# Patient Record
Sex: Male | Born: 1965
Health system: Southern US, Community
[De-identification: ages and names within clinical notes are randomized; demographics above are authoritative.]

## PROBLEM LIST (undated history)

## (undated) DIAGNOSIS — C3490 Malignant neoplasm of unspecified part of unspecified bronchus or lung: Secondary | ICD-10-CM

## (undated) DIAGNOSIS — C801 Malignant (primary) neoplasm, unspecified: Secondary | ICD-10-CM

## (undated) DIAGNOSIS — I739 Peripheral vascular disease, unspecified: Secondary | ICD-10-CM

## (undated) DIAGNOSIS — K921 Melena: Secondary | ICD-10-CM

## (undated) DIAGNOSIS — J449 Chronic obstructive pulmonary disease, unspecified: Secondary | ICD-10-CM

## (undated) DIAGNOSIS — J4489 Other specified chronic obstructive pulmonary disease: Secondary | ICD-10-CM

## (undated) DIAGNOSIS — F319 Bipolar disorder, unspecified: Secondary | ICD-10-CM

## (undated) DIAGNOSIS — L309 Dermatitis, unspecified: Secondary | ICD-10-CM

## (undated) DIAGNOSIS — K219 Gastro-esophageal reflux disease without esophagitis: Secondary | ICD-10-CM

## (undated) DIAGNOSIS — F419 Anxiety disorder, unspecified: Secondary | ICD-10-CM

## (undated) HISTORY — PX: TONSILLECTOMY: SUR1361

## (undated) HISTORY — DX: Malignant neoplasm of unspecified part of unspecified bronchus or lung: C34.90

## (undated) HISTORY — DX: Gastro-esophageal reflux disease without esophagitis: K21.9

## (undated) HISTORY — DX: Melena: K92.1

## (undated) HISTORY — DX: Dermatitis, unspecified: L30.9

## (undated) HISTORY — DX: Chronic obstructive pulmonary disease, unspecified: J44.9

## (undated) HISTORY — PX: APPENDECTOMY: SHX54

## (undated) HISTORY — DX: Malignant (primary) neoplasm, unspecified: C80.1

## (undated) HISTORY — PX: INGUINAL HERNIA REPAIR: SUR1180

## (undated) HISTORY — DX: Other specified chronic obstructive pulmonary disease: J44.89

---

## 2007-01-31 ENCOUNTER — Emergency Department (HOSPITAL_COMMUNITY): Admission: EM | Admit: 2007-01-31 | Discharge: 2007-01-31 | Payer: Self-pay | Admitting: Emergency Medicine

## 2008-07-16 ENCOUNTER — Ambulatory Visit (HOSPITAL_COMMUNITY): Admission: RE | Admit: 2008-07-16 | Discharge: 2008-07-16 | Payer: Self-pay | Admitting: Specialist
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
## 2008-11-14 ENCOUNTER — Encounter: Payer: Self-pay | Admitting: Family Medicine

## 2008-11-20 ENCOUNTER — Encounter: Payer: Self-pay | Admitting: Family Medicine

## 2010-03-24 ENCOUNTER — Ambulatory Visit: Payer: Self-pay | Admitting: Family Medicine

## 2010-03-24 DIAGNOSIS — M545 Low back pain: Secondary | ICD-10-CM

## 2010-03-24 DIAGNOSIS — IMO0002 Reserved for concepts with insufficient information to code with codable children: Secondary | ICD-10-CM | POA: Insufficient documentation

## 2010-04-13 ENCOUNTER — Encounter: Payer: Self-pay | Admitting: Family Medicine

## 2010-06-09 NOTE — Letter (Signed)
Summary: [facility]   Imported By: Marily Memos 03/24/2010 15:08:49  _____________________________________________________________________  External Attachment:    Type:   Image     Comment:   External Document

## 2010-06-09 NOTE — Consult Note (Signed)
Summary: [facility]  [facility]   Imported By: Marily Memos 03/26/2010 08:54:06  _____________________________________________________________________  External Attachment:    Type:   Image     Comment:   External Document

## 2010-06-09 NOTE — Letter (Signed)
Summary: [facility]   Imported By: Marily Memos 04/13/2010 15:18:38  _____________________________________________________________________  External Attachment:    Type:   Image     Comment:   External Document
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
## 2010-06-30 ENCOUNTER — Ambulatory Visit (INDEPENDENT_AMBULATORY_CARE_PROVIDER_SITE_OTHER): Payer: Self-pay | Admitting: Family Medicine

## 2010-06-30 ENCOUNTER — Encounter: Payer: Self-pay | Admitting: Family Medicine

## 2010-06-30 DIAGNOSIS — M545 Low back pain: Secondary | ICD-10-CM
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
## 2010-07-26 ENCOUNTER — Emergency Department (HOSPITAL_COMMUNITY)
Admission: EM | Admit: 2010-07-26 | Discharge: 2010-07-26 | Disposition: A | Discharge status: Home / Self Care | Payer: Self-pay | Attending: Emergency Medicine | Admitting: Emergency Medicine

## 2010-07-26 DIAGNOSIS — K089 Disorder of teeth and supporting structures, unspecified: Secondary | ICD-10-CM | POA: Insufficient documentation

## 2011-01-21 ENCOUNTER — Ambulatory Visit: Payer: Self-pay | Admitting: Family Medicine

## 2011-01-26 ENCOUNTER — Other Ambulatory Visit: Payer: Self-pay | Admitting: *Deleted

## 2011-01-26 MED ORDER — GABAPENTIN 300 MG PO CAPS: 300.0000 mg | ORAL_CAPSULE | Freq: Three times a day (TID) | ORAL | Status: DC

## 2011-02-11 ENCOUNTER — Ambulatory Visit (INDEPENDENT_AMBULATORY_CARE_PROVIDER_SITE_OTHER): Payer: Self-pay | Admitting: Family Medicine

## 2011-02-11 ENCOUNTER — Encounter: Payer: Self-pay | Admitting: Family Medicine

## 2011-02-11 DIAGNOSIS — M545 Low back pain, unspecified: Secondary | ICD-10-CM

## 2011-02-11 DIAGNOSIS — IMO0002 Reserved for concepts with insufficient information to code with codable children: Secondary | ICD-10-CM

## 2011-02-11 MED ORDER — GABAPENTIN 300 MG PO CAPS: 600.0000 mg | ORAL_CAPSULE | Freq: Three times a day (TID) | ORAL | Status: DC

## 2011-02-11 MED ORDER — DICLOFENAC SODIUM 75 MG PO TBEC: 75.0000 mg | DELAYED_RELEASE_TABLET | Freq: Two times a day (BID) | ORAL | Status: DC

## 2011-02-11 NOTE — Patient Instructions (Signed)
Recheck in 2 months.
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
## 2011-02-18 ENCOUNTER — Other Ambulatory Visit: Payer: Self-pay | Admitting: *Deleted

## 2011-02-18 DIAGNOSIS — M545 Low back pain: Secondary | ICD-10-CM

## 2011-02-18 DIAGNOSIS — IMO0002 Reserved for concepts with insufficient information to code with codable children: Secondary | ICD-10-CM

## 2011-02-18 MED ORDER — GABAPENTIN 300 MG PO CAPS: 600.0000 mg | ORAL_CAPSULE | Freq: Three times a day (TID) | ORAL | Status: DC

## 2011-04-08 ENCOUNTER — Ambulatory Visit (INDEPENDENT_AMBULATORY_CARE_PROVIDER_SITE_OTHER): Payer: Self-pay | Admitting: Family Medicine

## 2011-04-08 VITALS — BP 110/60

## 2011-04-08 DIAGNOSIS — M545 Low back pain: Secondary | ICD-10-CM

## 2011-04-08 DIAGNOSIS — IMO0002 Reserved for concepts with insufficient information to code with codable children: Secondary | ICD-10-CM

## 2011-04-08 MED ORDER — GABAPENTIN 600 MG PO TABS: 600.0000 mg | ORAL_TABLET | Freq: Three times a day (TID) | ORAL | Status: DC

## 2011-04-08 NOTE — Progress Notes (Signed)
  Subjective:    Patient ID: David Berry, male    DOB: 1966-03-07, 45 y.o.   MRN: [id number]  HPI  David Berry is pleasant 45 years old male am in today for followup on his low back pain and lumbar radiculopathy, he is doing well, he stated that he is pain-free, he complains of tightness on his back. He denies any radicular symptoms at the present time, no numbness or tingling radiated distally to his lower extremities. He denies any weakness on his lower extremities. He denies any urinary or fecal incontinence. He has been taking Neurontin 600 mg by mouth 3 times a day. He is tolerating these dose very well.     Patient Active Problem List  Diagnoses  . BACK PAIN, LUMBAR  . LUMBAR RADICULOPATHY   Current Outpatient Prescriptions on File Prior to Visit  Medication Sig Dispense Refill  . ARIPiprazole (ABILIFY) 10 MG tablet Take 10 mg by mouth daily.        . diclofenac (VOLTAREN) 75 MG EC tablet Take 1 tablet (75 mg total) by mouth 2 (two) times daily.  60 tablet  4  . DULoxetine (CYMBALTA) 60 MG capsule Take 60 mg by mouth daily.        [name] gabapentin (NEURONTIN) 300 MG capsule Take 2 capsules (600 mg total) by mouth 3 (three) times daily.  180 capsule  4  . lamoTRIgine (LAMICTAL) 100 MG tablet Take 100 mg by mouth daily.         History   Social History  . Marital Status: Single    Spouse Name: N/A    Number of Children: N/A  . Years of Education: N/A   Occupational History  . Not on file.   Social History Main Topics  . Smoking status: Current Everyday Smoker    Types: Cigars  . Smokeless tobacco: Never Used   Comment: smokes 3 black and mild cigars per day  . Alcohol Use: Not on file  . Drug Use: Not on file  . Sexually Active: Not on file   Other Topics Concern  . Not on file   Social History Narrative  . No narrative on file       Review of Systems  Constitutional: Negative for chills, diaphoresis and fatigue.  Musculoskeletal: Negative for myalgias, back  pain, joint swelling and gait problem.  Neurological: Negative for weakness and numbness.        Objective:   Physical Exam  Constitutional: He appears well-developed and well-nourished.       BP 110/60   Pulmonary/Chest: Effort normal.  Musculoskeletal:       Low back with intact skin. No swelling, no hematomas. FROM for flexion, extension, rotation and lateralization.  No Tenderness to palpation on SI joint area B/L. Faber test negative for SI joint pain. Straight leg raise negative B/L. Strength 5/5 for hip flexion and extension, 5/5 for knee flexion and extension, 5/5 for ankle plantar and dorsal flexion B/L. DTR patellar and achilles II/IV B/L Sensation intact distally B/L. No leg discrepancy.   Skin: Skin is warm. No erythema.  Psychiatric: He has a normal mood and affect. His behavior is normal. Thought content normal.          Assessment & Plan:   1. BACK PAIN, LUMBAR   2. LUMBAR RADICULOPATHY    Neurontin 600 mg take 1 tablet orally every 8 hours Referral to Physical therapy Cont. Home exercise program. F/U after completing PT.

## 2011-04-08 NOTE — Patient Instructions (Signed)
Neurontin 600 mg take 1 tablet orally every 8 hours Physical therapy Cont. Home exercise program.

## 2011-04-15 ENCOUNTER — Ambulatory Visit: Payer: Self-pay | Admitting: Family Medicine

## 2011-04-21 ENCOUNTER — Ambulatory Visit: Payer: Self-pay | Admitting: Physical Therapy

## 2011-05-05 ENCOUNTER — Ambulatory Visit: Payer: Self-pay | Admitting: Physical Therapy

## 2011-05-17 ENCOUNTER — Other Ambulatory Visit: Payer: Self-pay | Admitting: *Deleted

## 2011-05-17 DIAGNOSIS — IMO0002 Reserved for concepts with insufficient information to code with codable children: Secondary | ICD-10-CM

## 2011-05-17 DIAGNOSIS — M545 Low back pain: Secondary | ICD-10-CM

## 2011-05-18 ENCOUNTER — Ambulatory Visit: Payer: Self-pay | Attending: Family Medicine | Admitting: Physical Therapy

## 2011-05-18 DIAGNOSIS — M545 Low back pain, unspecified: Secondary | ICD-10-CM | POA: Insufficient documentation

## 2011-05-18 DIAGNOSIS — IMO0001 Reserved for inherently not codable concepts without codable children: Secondary | ICD-10-CM | POA: Insufficient documentation

## 2011-05-25 ENCOUNTER — Ambulatory Visit: Payer: Self-pay | Admitting: Physical Therapy

## 2011-05-27 ENCOUNTER — Ambulatory Visit: Payer: Self-pay | Admitting: Physical Therapy

## 2011-05-27 ENCOUNTER — Encounter: Payer: Self-pay | Admitting: Physical Therapy

## 2011-06-01 ENCOUNTER — Ambulatory Visit: Payer: Self-pay | Admitting: Physical Therapy

## 2011-06-03 ENCOUNTER — Ambulatory Visit: Payer: Self-pay | Admitting: Physical Therapy

## 2011-06-09 ENCOUNTER — Ambulatory Visit: Payer: Self-pay | Admitting: Physical Therapy

## 2011-06-10 ENCOUNTER — Ambulatory Visit: Payer: Self-pay | Admitting: Physical Therapy

## 2011-06-15 ENCOUNTER — Ambulatory Visit: Payer: Self-pay | Attending: Family Medicine | Admitting: Physical Therapy

## 2011-06-15 DIAGNOSIS — IMO0001 Reserved for inherently not codable concepts without codable children: Secondary | ICD-10-CM | POA: Insufficient documentation

## 2011-06-15 DIAGNOSIS — M545 Low back pain, unspecified: Secondary | ICD-10-CM | POA: Insufficient documentation

## 2011-06-15 DIAGNOSIS — M2569 Stiffness of other specified joint, not elsewhere classified: Secondary | ICD-10-CM | POA: Insufficient documentation

## 2011-06-24 ENCOUNTER — Ambulatory Visit (INDEPENDENT_AMBULATORY_CARE_PROVIDER_SITE_OTHER): Payer: Self-pay | Admitting: Family Medicine

## 2011-06-24 VITALS — BP 104/62

## 2011-06-24 DIAGNOSIS — IMO0002 Reserved for concepts with insufficient information to code with codable children: Secondary | ICD-10-CM

## 2011-06-24 DIAGNOSIS — M545 Low back pain, unspecified: Secondary | ICD-10-CM

## 2011-06-24 NOTE — Progress Notes (Signed)
  Subjective:    Patient ID: David Berry, male    DOB: 1966-01-02, 46 y.o.   MRN: [id number]  HPI  Mr. Sculley is going to follow up of his low back pain, right lower leg radiculopathy. He has been doing physical therapy, he was discharged to a home exercise program. Stated that the radicular component has disappeared, and he still has the low back pain on and off, which has worsened in the last 3 days, he localized the pain in the distal portion of his lumbar spine across his back, is a sharp pain, worse with activities, improved by rest and exercises. Denies any urinary or fecal incontinence. He denies any fever or chills. No weakness on his lower extremities.    Patient Active Problem List  Diagnoses  . BACK PAIN, LUMBAR  . LUMBAR RADICULOPATHY   Current Outpatient Prescriptions on File Prior to Visit  Medication Sig Dispense Refill  . ARIPiprazole (ABILIFY) 10 MG tablet Take 10 mg by mouth daily.        . diclofenac (VOLTAREN) 75 MG EC tablet Take 1 tablet (75 mg total) by mouth 2 (two) times daily.  60 tablet  4  . DULoxetine (CYMBALTA) 60 MG capsule Take 60 mg by mouth daily.        [name] gabapentin (NEURONTIN) 600 MG tablet Take 1 tablet (600 mg total) by mouth 3 (three) times daily.  270 tablet  2  . lamoTRIgine (LAMICTAL) 100 MG tablet Take 100 mg by mouth daily.         No Known Allergies    Review of Systems  Constitutional: Negative for fever, chills, diaphoresis and fatigue.  Musculoskeletal: Positive for back pain. Negative for joint swelling, arthralgias and gait problem.  Neurological: Negative for dizziness, weakness and numbness.       Objective:   Physical Exam  Constitutional: He is oriented to person, place, and time. He appears well-developed and well-nourished.       BP 104/62   Pulmonary/Chest: Effort normal.  Musculoskeletal:        Low back with intact skin. No swelling, no hematomas. FROM for flexion, extension, rotation and lateralization.  No  Tenderness to palpation on SI joint area B/L. Faber test negative for SI joint pain. Straight leg raise negative B/L. Strength 5/5 for hip flexion and extension, 5/5 for knee flexion and extension, 5/5 for ankle plantar and dorsal flexion B/L. DTR patellar and achilles II/IV B/L Sensation intact distally B/L. No leg discrepancy  Neurological: He is alert and oriented to person, place, and time.  Skin: Skin is warm. No rash noted. No erythema.  Psychiatric: He has a normal mood and affect. His behavior is normal. Thought content normal.          Assessment & Plan:   1. BACK PAIN, LUMBAR   2. LUMBAR RADICULOPATHY    Continue Neurontin 600 mg tid Continue low-back home exercise program, specially extension exercises. Continue TENS unit at home. Request records from [facility] orthopedics, including medical notes and MRIs of his lumbar spine. I will call him after reviewing the records for further management.

## 2011-06-25 ENCOUNTER — Encounter: Payer: Self-pay | Admitting: Family Medicine

## 2011-08-31 ENCOUNTER — Ambulatory Visit: Payer: Self-pay | Admitting: Family Medicine

## 2011-12-22 ENCOUNTER — Ambulatory Visit (INDEPENDENT_AMBULATORY_CARE_PROVIDER_SITE_OTHER): Payer: Self-pay | Admitting: Family Medicine

## 2011-12-22 VITALS — BP 104/69 | Ht 71.0 in | Wt 135.0 lb

## 2011-12-22 DIAGNOSIS — M545 Low back pain: Secondary | ICD-10-CM

## 2011-12-22 MED ORDER — MELOXICAM 15 MG PO TABS: 15.0000 mg | ORAL_TABLET | Freq: Every day | ORAL | Status: DC

## 2011-12-22 MED ORDER — CYCLOBENZAPRINE HCL 10 MG PO TABS: 10.0000 mg | ORAL_TABLET | Freq: Every evening | ORAL | Status: DC | PRN

## 2011-12-22 NOTE — Patient Instructions (Addendum)
Thank you for coming in today. Try to get some pads for the TENS unit.  Take the meloxicam daily for at least 1-2 weeks.  Please take the flexeril as needed.  Come back in 2-4 weeks if not better.  Come back or go to the emergency room if you notice new weakness new numbness problems walking or bowel or bladder problems.

## 2011-12-23 NOTE — Progress Notes (Signed)
David Berry is a 46 y.o. male who presents to [facility] today for back pain.  Patient is an established patient at the [facility] medicine [facility] where he seeks treatment for chronic back pain.  He is chronic back pain with exacerbations. He was last seen in February.  Since February he has had mild smoldering lumbar back pain.  He takes gabapentin for radicular symptoms which she no longer has.  About one week ago he developed left lower back pain located in the SI joint region.  He denies any radicular pain numbness weakness or difficulty walking or bowel or bladder dysfunction.  He has not tried any medications aside from gabapentin which is not helpful.  He denies any injury. Additionally he is unable to use his TENS unit which has been effective in the past because he does not have pads.  Rest improves his pain while standing from a seated position worsens his pain.   PMH reviewed.  History  Substance Use Topics  . Smoking status: Current Everyday Smoker    Types: Cigars  . Smokeless tobacco: Never Used   Comment: smokes 3 black and mild cigars per day  . Alcohol Use: Not on file   ROS as above otherwise neg   Exam:  BP 104/69  Ht 5\' 11"  (1.803 m)  Wt 135 lb (61.236 kg)  BMI 18.83 kg/m2 Gen: Well NAD MSK: Back: Nontender over spinal midline. Mildly tender to palpation of the left SI joint. Flexion is intact extension causes pain but is intact. Reflexes are intact and equal throughout strength is preserved throughout patient can get onto and off exam table by himself.

## 2011-12-23 NOTE — Assessment & Plan Note (Addendum)
Chronic lumbar back pain today with an exacerbation. Current pain is likely mechanical muscular in nature. Plan to treat with meloxicam, Flexeril and continuation of gabapentin. Additionally will attempt to obtain pads for his TENS unit as that has been effective in the past. We'll followup in 2-4 weeks if not improved at that point would consider referral to physical therapy. Discussed warning signs or symptoms. Please see discharge instructions. Patient expresses understanding. Additionally sent a consent for release of medical information to [facility] orthopedics for her copy of his old lumbar MRI.

## 2012-04-27 ENCOUNTER — Other Ambulatory Visit: Payer: Self-pay | Admitting: *Deleted

## 2012-04-27 DIAGNOSIS — IMO0002 Reserved for concepts with insufficient information to code with codable children: Secondary | ICD-10-CM

## 2012-04-27 DIAGNOSIS — M545 Low back pain: Secondary | ICD-10-CM

## 2012-04-27 MED ORDER — MELOXICAM 15 MG PO TABS: 15.0000 mg | ORAL_TABLET | Freq: Every day | ORAL | Status: DC

## 2012-04-27 MED ORDER — GABAPENTIN 600 MG PO TABS: 600.0000 mg | ORAL_TABLET | Freq: Three times a day (TID) | ORAL | Status: DC

## 2012-05-17 ENCOUNTER — Encounter: Payer: Self-pay | Admitting: Family Medicine

## 2012-05-17 ENCOUNTER — Ambulatory Visit (INDEPENDENT_AMBULATORY_CARE_PROVIDER_SITE_OTHER): Payer: No Typology Code available for payment source | Admitting: Family Medicine

## 2012-05-17 VITALS — BP 114/67 | HR 82 | Ht 71.0 in | Wt 135.0 lb

## 2012-05-17 DIAGNOSIS — M545 Low back pain: Secondary | ICD-10-CM

## 2012-05-17 DIAGNOSIS — IMO0002 Reserved for concepts with insufficient information to code with codable children: Secondary | ICD-10-CM

## 2012-05-17 MED ORDER — MELOXICAM 15 MG PO TABS: 15.0000 mg | ORAL_TABLET | Freq: Every day | ORAL | Status: DC

## 2012-05-17 MED ORDER — CYCLOBENZAPRINE HCL 10 MG PO TABS: 10.0000 mg | ORAL_TABLET | Freq: Every evening | ORAL | Status: DC | PRN

## 2012-05-17 NOTE — Patient Instructions (Addendum)
Thank you for coming in today. Lets try the meloxicam and flexeril for the recent pain.  I have called about the release of records and hopefully I will get a fax soon.  Come back in 4 weeks or so to talk about your pain.  Come back or go to the emergency room if you notice new weakness new numbness problems walking or bowel or bladder problems.

## 2012-05-18 NOTE — Progress Notes (Signed)
David Berry is a 47 y.o. male who presents to [facility] today for back pain flare. Patient has a history of chronic low back pain since 2010 with intermittent flares. He previously was managed by [facility].   He was doing reasonably well until 2 days ago when he experienced acute onset of bilateral low back pain without significant radiation.  Previously his pain has been well-controlled Flexeril meloxicam, however he doesn't have any currently.  He is still working and does not want to miss work.  He feels well otherwise has no radiating pain weakness numbness fevers or chills.  The pain is moderate to severe worse with activity better with rest.   PMH reviewed. Chronic low back pain History  Substance Use Topics  . Smoking status: Current Every Day Smoker    Types: Cigars  . Smokeless tobacco: Never Used     Comment: smokes 3 black and mild cigars per day  . Alcohol Use: Not on file   ROS as above otherwise neg   Exam:  BP 114/67  Pulse 82  Ht 5\' 11"  (1.803 m)  Wt 135 lb (61.236 kg)  BMI 18.83 kg/m2 Gen: Well NAD MSK: Back:  Nontender to spinal midline. Tender to palpation of bilateral lumbar paraspinal muscles significant spasm appreciated.  Nontender over bilateral SI joint. Back range of motion is limited with flexion extension rotation secondary to pain. Negative straight leg raise test. Patient can get on and off exam table.  He can stand on his heels and toes and has a mildly antalgic gait  Reviewed MRI report from [facility] orthopedics from 11/19/2008: Depression shows tiny small right paracentral hernial extrusion displacing the right L5 root with no significant left neural displacement.   (This report will be scanned into the chart)

## 2012-05-18 NOTE — Assessment & Plan Note (Signed)
Chronic low back pain with current flare.  Previously well treated with meloxicam and Flexeril. Plan to refill these prescriptions. I offered a job note patient declined.  Patient will followup as needed for this issue. Discussed warning signs or symptoms. Please see discharge instructions. Patient expresses understanding.

## 2012-06-14 ENCOUNTER — Encounter: Payer: Self-pay | Admitting: Family Medicine

## 2012-06-14 ENCOUNTER — Ambulatory Visit (INDEPENDENT_AMBULATORY_CARE_PROVIDER_SITE_OTHER): Payer: Self-pay | Admitting: Family Medicine

## 2012-06-14 VITALS — BP 109/71 | HR 75 | Ht 71.0 in | Wt 135.0 lb

## 2012-06-14 DIAGNOSIS — M545 Low back pain: Secondary | ICD-10-CM

## 2012-06-14 MED ORDER — MELOXICAM 15 MG PO TABS: 15.0000 mg | ORAL_TABLET | Freq: Every day | ORAL | Status: DC

## 2012-06-14 MED ORDER — CYCLOBENZAPRINE HCL 10 MG PO TABS: 10.0000 mg | ORAL_TABLET | Freq: Every evening | ORAL | Status: AC | PRN

## 2012-06-14 NOTE — Patient Instructions (Addendum)
Thank you for coming in today. Take the flexeril and mobic as needed.  Come back in 6 months or so or if worse.  Come back or go to the emergency room if you notice new weakness new numbness problems walking or bowel or bladder problems.

## 2012-06-14 NOTE — Progress Notes (Signed)
FLOR HOUDESHELL is a 47 y.o. male who presents to [facility] today for followup low back pain. Patient has a history of chronic intermittent low back pain. He was last seen about one month ago for back flare. In the interim his pain has significantly improved. He denies any radicular pain weakness or numbness. He notes mild chronic low back pain. He notes that Flexeril intermittently and it is very helpful is in the sleep. He denies any significant side effects such as dry mouth her grogginess. Additionally takes meloxicam as needed which also helps his pain. He is still working and feels well.   PMH reviewed.  History  Substance Use Topics  . Smoking status: Current Every Day Smoker    Types: Cigars  . Smokeless tobacco: Never Used     Comment: smokes 3 black and mild cigars per day  . Alcohol Use: Not on file   ROS as above otherwise neg   Exam:  BP 109/71  Pulse 75  Ht 5\' 11"  (1.803 m)  Wt 135 lb (61.236 kg)  BMI 18.83 kg/m2 Gen: Well NAD MSK: Back Nontender over spinal midline. Mildly tender to palpation of the left SI joint. Flexion is intact extension causes pain but is intact.  Reflexes are intact and equal throughout strength is preserved throughout patient can get onto and off exam table by himself

## 2012-06-14 NOTE — Assessment & Plan Note (Signed)
Significantly improved with intermittent meloxicam and Flexeril. We'll continue. Followup in 6 months for recheck. Followup sooner if worsening Discussed warning signs or symptoms. Please see discharge instructions. Patient expresses understanding.

## 2013-03-19 ENCOUNTER — Emergency Department (HOSPITAL_COMMUNITY): Payer: Self-pay

## 2013-03-19 ENCOUNTER — Encounter (HOSPITAL_COMMUNITY): Payer: Self-pay | Admitting: Emergency Medicine

## 2013-03-19 ENCOUNTER — Emergency Department (HOSPITAL_COMMUNITY)
Admission: EM | Admit: 2013-03-19 | Discharge: 2013-03-20 | Disposition: A | Discharge status: Home / Self Care | Payer: Self-pay | Attending: Emergency Medicine | Admitting: Emergency Medicine

## 2013-03-19 DIAGNOSIS — M7750 Other enthesopathy of unspecified foot: Secondary | ICD-10-CM | POA: Insufficient documentation

## 2013-03-19 DIAGNOSIS — IMO0001 Reserved for inherently not codable concepts without codable children: Secondary | ICD-10-CM | POA: Insufficient documentation

## 2013-03-19 DIAGNOSIS — F17200 Nicotine dependence, unspecified, uncomplicated: Secondary | ICD-10-CM | POA: Insufficient documentation

## 2013-03-19 DIAGNOSIS — M7661 Achilles tendinitis, right leg: Secondary | ICD-10-CM

## 2013-03-19 IMAGING — CR DG ANKLE COMPLETE 3+V*R*
3 series · 3 of 3 positions shown · non-contrast
Comparison: None.

CLINICAL DATA: Posterior right ankle pain. No reported injury.

EXAM:
RIGHT ANKLE - COMPLETE 3+ VIEW

[x ankle ap right]
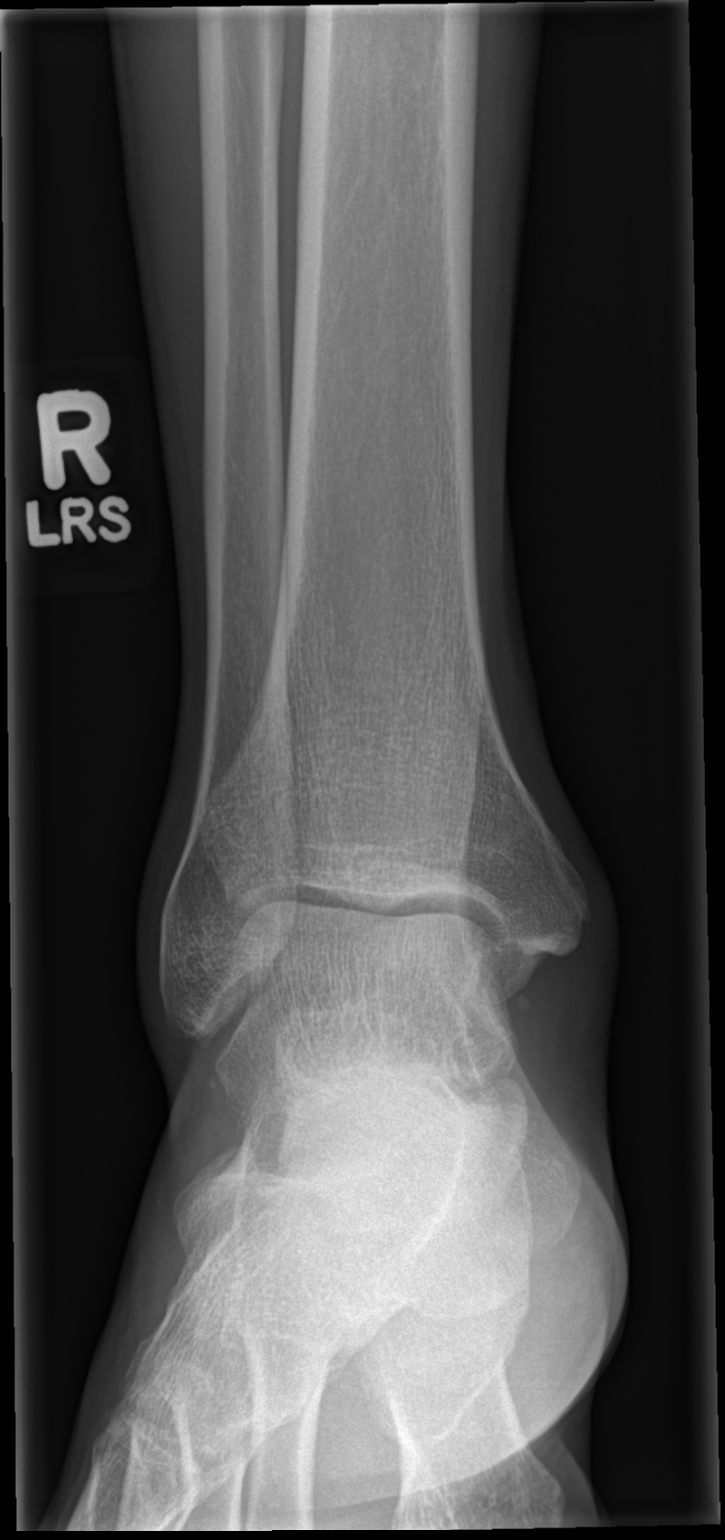

[x ankle obl right]
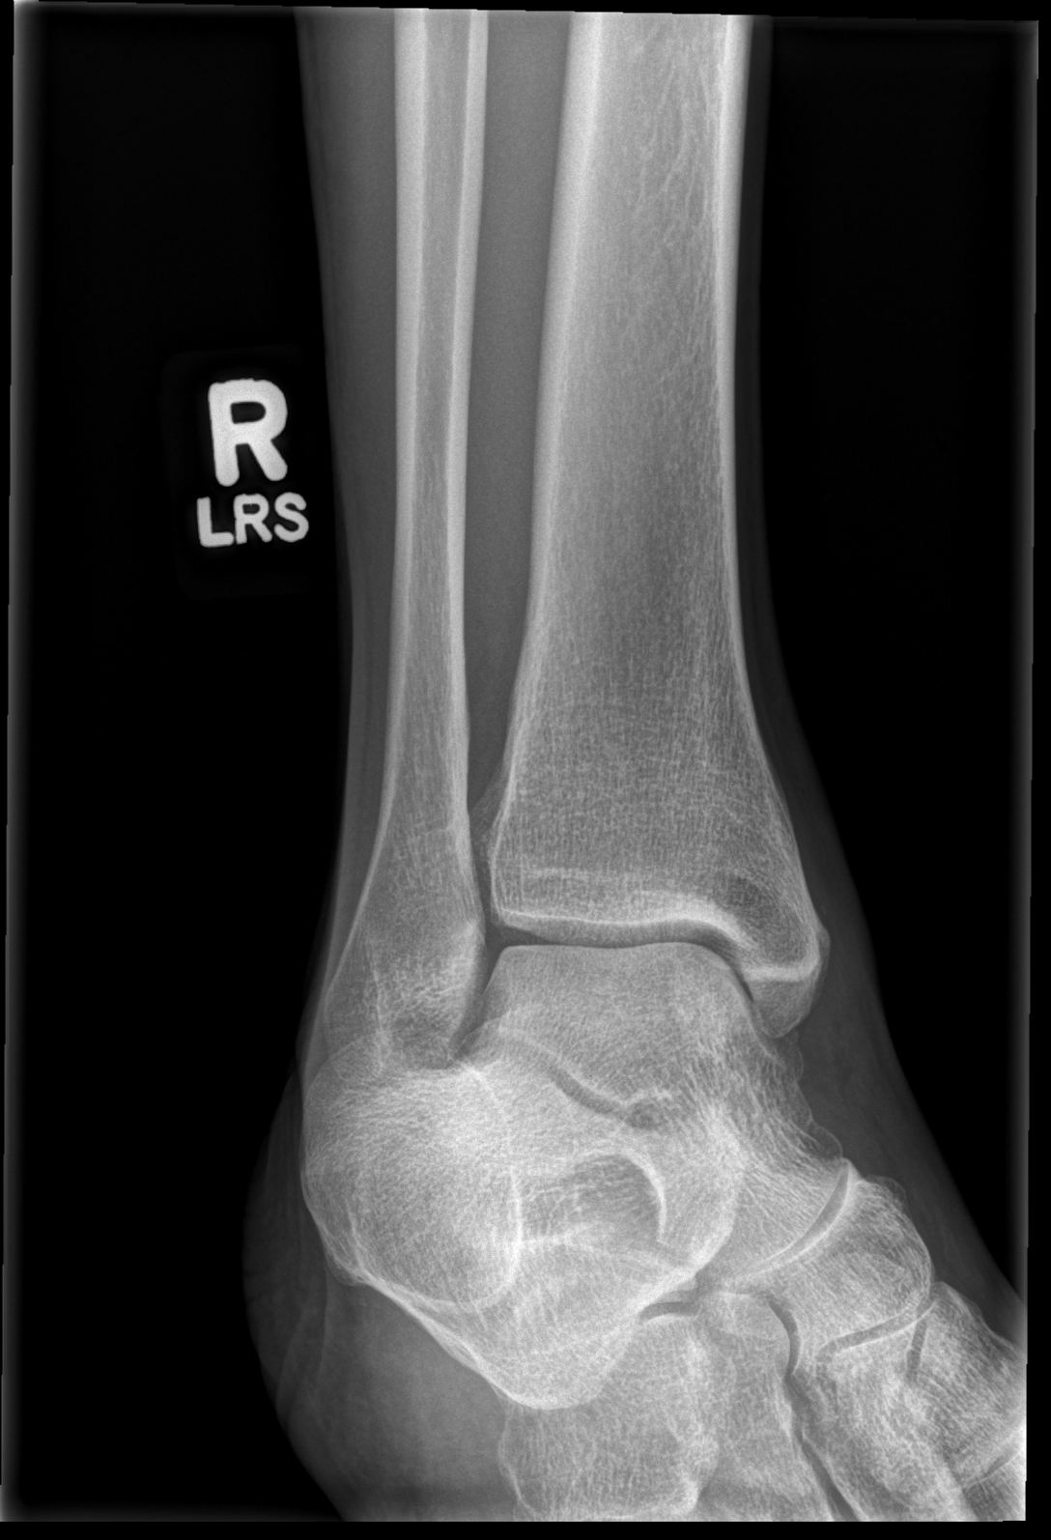

[x ankle lat right]
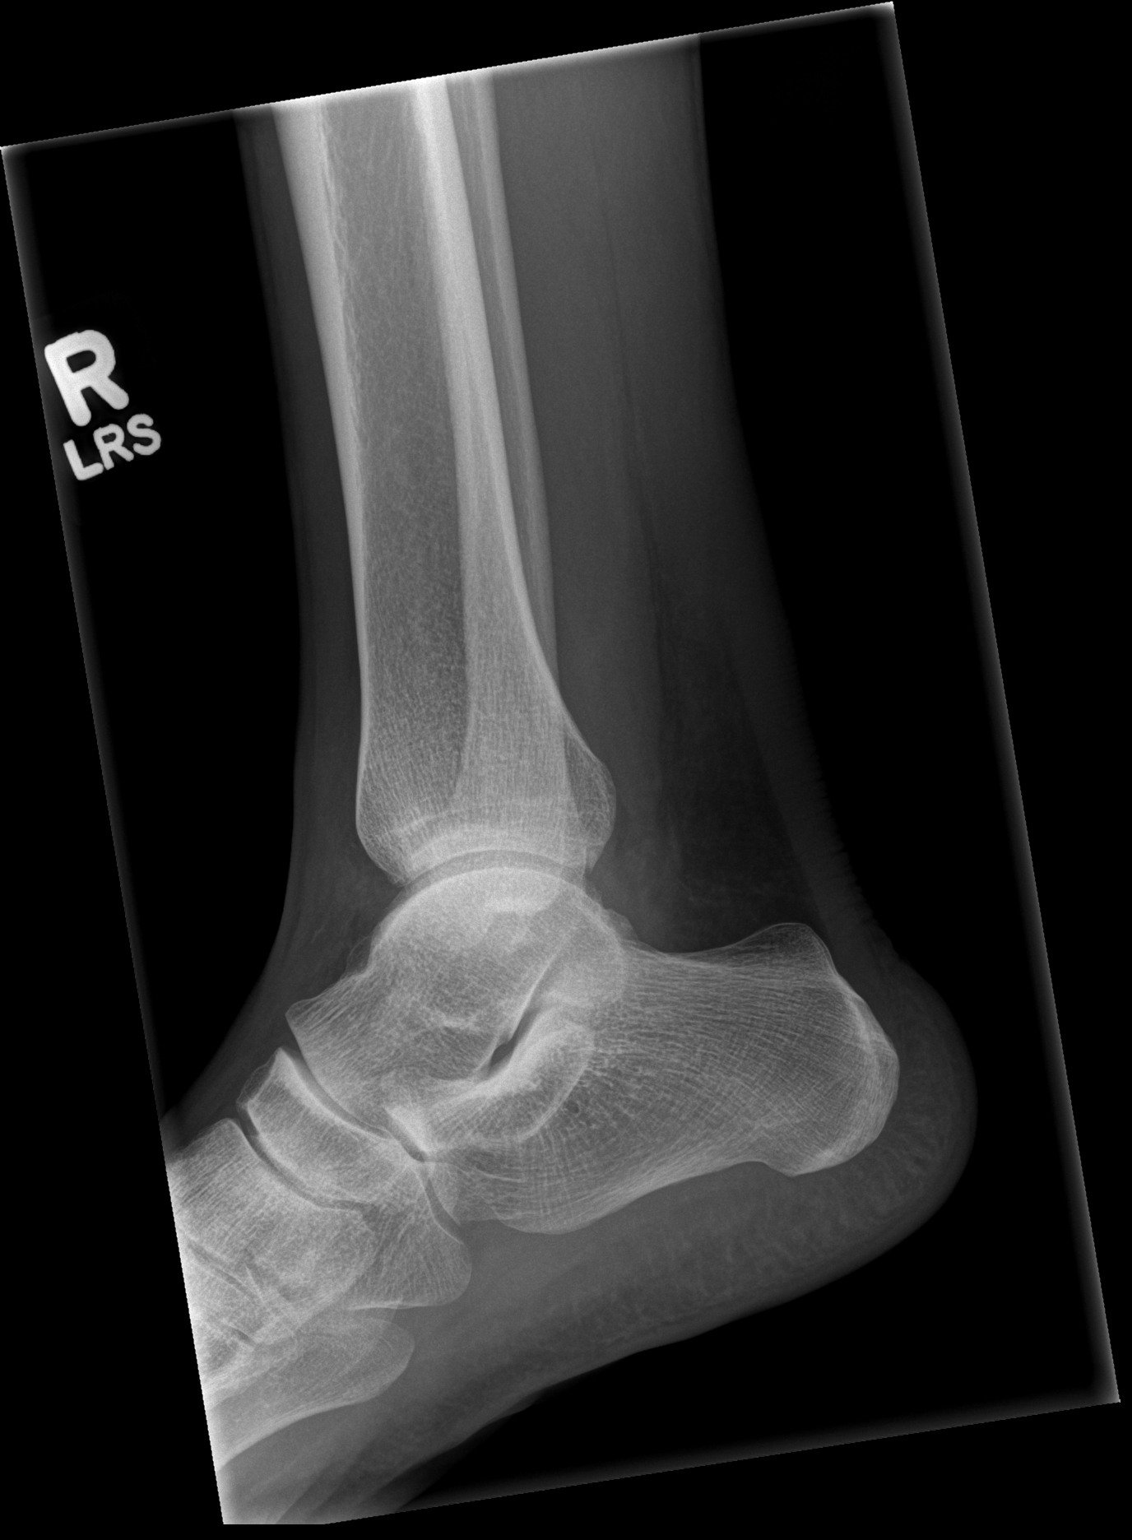

[3 of 3 positions shown; findings below may reference images not displayed]

FINDINGS: Small, corticated ossification center distal to the medial malleolus
and similar ossification center lateral to the talus. No definite
effusion. Mild diffuse soft tissue swelling. No acute fracture or
dislocation seen.
IMPRESSION: No acute bony abnormality.

## 2013-03-19 NOTE — ED Notes (Signed)
Pt. reports right ankle pain with mild swelling onset yesterday denies injury or fall , ambulatory.

## 2013-03-20 MED ORDER — NAPROXEN 375 MG PO TABS: 375.0000 mg | ORAL_TABLET | Freq: Two times a day (BID) | ORAL | Status: DC

## 2013-03-20 NOTE — ED Provider Notes (Signed)
CSN: [id number]     Arrival date & time 03/19/13  2211 History   First MD Initiated Contact with Patient 03/19/13 2248     Chief Complaint  Patient presents with  . Ankle Pain   (Consider location/radiation/quality/duration/timing/severity/associated sxs/prior Treatment) The history is provided by the patient. No language interpreter was used.  David Berry is a 47 y/o M with no significant PMHx presenting to the ED with right achilles pain and swelling that started approximately 2 days ago. Patient reported that the discomfort is a constant throbbing sensation without radiation. Patient reported that nothing makes the pain better, reported that he has not used anything for the discomfort - reported that applying pressure to the right foot makes the pain worse. Patient reported that this similar occurrence happened last year - reported that he rested and elevated the leg with improvement and it got better. Denied falls, injury, numbness, tingling, loss of sensation.  PCP none  History reviewed. No pertinent past medical history. History reviewed. No pertinent past surgical history. No family history on file. History  Substance Use Topics  . Smoking status: Current Every Day Smoker    Types: Cigars  . Smokeless tobacco: Never Used     Comment: smokes 3 black and mild cigars per day  . Alcohol Use: Not on file    Review of Systems  Musculoskeletal: Positive for myalgias. Negative for arthralgias and joint swelling.  Neurological: Negative for weakness and numbness.    Allergies  Review of patient's allergies indicates no known allergies.  Home Medications   Current Outpatient Rx  Name  Route  Sig  Dispense  Refill  . naproxen (NAPROSYN) 375 MG tablet   Oral   Take 1 tablet (375 mg total) by mouth 2 (two) times daily.   20 tablet   0    BP 105/65  Pulse 79  Temp(Src) 98.1 F (36.7 C) (Oral)  Resp 18  SpO2 100% Physical Exam  Nursing note and vitals  reviewed. Constitutional: He is oriented to person, place, and time. He appears well-developed and well-nourished. No distress.  Neck: Normal range of motion. Neck supple.  Cardiovascular:  Pulses:      Dorsalis pedis pulses are 2+ on the right side, and 2+ on the left side.  Musculoskeletal: Normal range of motion. He exhibits tenderness.  Mild swelling localized to the right Achilles tendon. Discomfort upon palpation all along the right Achilles tendon. Negative Thompson sign noted. Negative deformities noted to the right ankle. Full range of motion to right foot, digits of the right foot, right ankle, right knee.  Neurological: He is alert and oriented to person, place, and time. He exhibits normal muscle tone. Coordination normal.  Strength 5+/5+ lower extremities bilaterally with resistance applied, equal distribution noted  Skin: Skin is warm and dry. No rash noted. He is not diaphoretic. No erythema.  Psychiatric: He has a normal mood and affect. His behavior is normal. Thought content normal.    ED Course  Procedures (including critical care time) Labs Review Labs Reviewed - No data to display Imaging Review Dg Ankle Complete Right  03/19/2013   CLINICAL DATA:  Posterior right ankle pain. No reported injury.  EXAM: RIGHT ANKLE - COMPLETE 3+ VIEW  COMPARISON:  None.  FINDINGS: Small, corticated ossification center distal to the medial malleolus and similar ossification center lateral to the talus. No definite effusion. Mild diffuse soft tissue swelling. No acute fracture or dislocation seen.  IMPRESSION: No acute bony abnormality.  Electronically Signed   By: Gordan Payment M.D.   On: 03/19/2013 23:30    EKG Interpretation   None       MDM   1. Achilles tendinitis, right     Filed Vitals:   03/19/13 2215  BP: 105/65  Pulse: 79  Temp: 98.1 F (36.7 C)  TempSrc: Oral  Resp: 18  SpO2: 100%   Patient presenting to emergency department with right Achilles discomfort that  started approximately 2 days ago. Alert and oriented. Mild swelling noted to the right achilles tendon - discomfort upon palpation to the achilles tendon. Negative pain upon palpation to the calcaneous. Negative thompson sign. Pulses palpable and strong, DP. Discomfort with applying pressure to the right foot.  Plain film negative acute bony injury - mild diffuse tissue swelling.  Doubt Achilles tendon rupture-negative Thompson sign. Suspicion to be Achilles tendinitis. Patient placed in cam walker boot. Patient stable, afebrile. Discharge patient with anti-inflammatories. Discussed with patient to rest, ice, elevate. Referred to orthopedics. Discussed with patient to closely monitor symptoms and if symptoms are to worsen or change report back to emergency apartment gastric return structures given. Patient agreed to plan of care, understood, all questions answered  Raymon Mutton, PA-C 03/21/13 2234

## 2013-03-22 NOTE — ED Provider Notes (Signed)
Medical screening examination/treatment/procedure(s) were performed by non-physician practitioner and as supervising physician I was immediately available for consultation/collaboration.  EKG Interpretation   None        Juliet Rude. Rubin Payor, MD 03/22/13 [phone]

## 2014-04-02 ENCOUNTER — Emergency Department (INDEPENDENT_AMBULATORY_CARE_PROVIDER_SITE_OTHER)
Admission: EM | Admit: 2014-04-02 | Discharge: 2014-04-02 | Disposition: A | Discharge status: Home / Self Care | Payer: Self-pay | Source: Home / Self Care

## 2014-04-02 ENCOUNTER — Encounter (HOSPITAL_COMMUNITY): Payer: Self-pay | Admitting: Emergency Medicine

## 2014-04-02 DIAGNOSIS — K029 Dental caries, unspecified: Secondary | ICD-10-CM

## 2014-04-02 MED ORDER — DICLOFENAC POTASSIUM 50 MG PO TABS: 50.0000 mg | ORAL_TABLET | Freq: Three times a day (TID) | ORAL | Status: DC

## 2014-04-02 MED ORDER — CLINDAMYCIN HCL 300 MG PO CAPS: 300.0000 mg | ORAL_CAPSULE | Freq: Three times a day (TID) | ORAL | Status: DC

## 2014-04-02 NOTE — Discharge Instructions (Signed)
Take medicine as prescribed, see your dentist as soon as possible °

## 2014-04-02 NOTE — ED Provider Notes (Signed)
CSN: [id number]     Arrival date & time 04/02/14  1721 History   None    Chief Complaint  Patient presents with  . Dental Pain   (Consider location/radiation/quality/duration/timing/severity/associated sxs/prior Treatment) Patient is a 48 y.o. male presenting with tooth pain. The history is provided by the patient.  Dental Pain Location:  Upper Upper teeth location:  14/LU 1st molar Quality:  Throbbing Severity:  Mild Onset quality:  Sudden Duration:  2 days Progression:  Worsening Chronicity:  New Context: dental caries, filling fell out and poor dentition   Worsened by:  Nothing tried Ineffective treatments:  None tried Associated symptoms: no facial pain, no facial swelling and no fever   Risk factors: lack of dental care, periodontal disease and smoking     History reviewed. No pertinent past medical history. History reviewed. No pertinent past surgical history. History reviewed. No pertinent family history. History  Substance Use Topics  . Smoking status: Current Every Day Smoker    Types: Cigars  . Smokeless tobacco: Never Used     Comment: smokes 3 black and mild cigars per day  . Alcohol Use: No    Review of Systems  Constitutional: Negative for fever.  HENT: Positive for dental problem. Negative for facial swelling.     Allergies  Review of patient's allergies indicates no known allergies.  Home Medications   Prior to Admission medications   Medication Sig Start Date End Date Taking? Authorizing Provider  clindamycin (CLEOCIN) 300 MG capsule Take 1 capsule (300 mg total) by mouth 3 (three) times daily. 04/02/14   Billy Fischer, MD  diclofenac (CATAFLAM) 50 MG tablet Take 1 tablet (50 mg total) by mouth 3 (three) times daily. For tooth pain 04/02/14   Billy Fischer, MD  naproxen (NAPROSYN) 375 MG tablet Take 1 tablet (375 mg total) by mouth 2 (two) times daily. 03/20/13   Marissa Sciacca, PA-C   BP 123/70 mmHg  Pulse 79  Temp(Src) 98.9 F (37.2 C)  (Oral)  SpO2 98% Physical Exam  Constitutional: He is oriented to person, place, and time. He appears well-developed and well-nourished. No distress.  HENT:  Right Ear: External ear normal.  Left Ear: External ear normal.  Mouth/Throat: Oropharynx is clear and moist and mucous membranes are normal. Abnormal dentition. Dental caries present.    Neck: Normal range of motion. Neck supple.  Lymphadenopathy:    He has no cervical adenopathy.  Neurological: He is alert and oriented to person, place, and time.  Skin: Skin is warm and dry.  Nursing note and vitals reviewed.   ED Course  Procedures (including critical care time) Labs Review Labs Reviewed - No data to display  Imaging Review No results found.   MDM   1. Pain due to dental caries        Billy Fischer, MD 04/02/14 [phone]

## 2014-04-02 NOTE — ED Notes (Signed)
Reports dental pain top let tooth in the back.  States filling came out.  Mild relief with ibuprofen.

## 2017-07-19 ENCOUNTER — Ambulatory Visit (HOSPITAL_COMMUNITY)
Admission: EM | Admit: 2017-07-19 | Discharge: 2017-07-19 | Disposition: A | Discharge status: Home / Self Care | Payer: Self-pay | Attending: Family Medicine | Admitting: Family Medicine

## 2017-07-19 ENCOUNTER — Encounter (HOSPITAL_COMMUNITY): Payer: Self-pay | Admitting: Family Medicine

## 2017-07-19 DIAGNOSIS — H6123 Impacted cerumen, bilateral: Secondary | ICD-10-CM

## 2017-07-19 NOTE — ED Triage Notes (Signed)
Pt here for left ear pain that is now radiating to the right x 1 months. sts that he has been using ear drops. sts popping and decreased hearing.

## 2017-07-19 NOTE — ED Provider Notes (Signed)
[place]    CSN: [id number] Arrival date & time: 07/19/17  1013     History   Chief Complaint Chief Complaint  Patient presents with  . Otalgia    HPI David Berry is a 52 y.o. male.   52 year old male comes in for 1 month history of left ear pain that is now radiating to the right.  He also feels that his ears are popping with muffled hearing. Denies fever, chills, night sweats.  Denies URI symptoms such as cough, congestion, sore throat.  Uses cotton swabs.  Denies recent travel/swelling.  Has tried Debrox without relief.      History reviewed. No pertinent past medical history.  Patient Active Problem List   Diagnosis Date Noted  . BACK PAIN, LUMBAR 03/24/2010  . LUMBAR RADICULOPATHY 03/24/2010    History reviewed. No pertinent surgical history.     Home Medications    Prior to Admission medications   Not on File    Family History History reviewed. No pertinent family history.  Social History Social History   Tobacco Use  . Smoking status: Current Every Day Smoker    Types: Cigars  . Smokeless tobacco: Never Used  . Tobacco comment: smokes 3 black and mild cigars per day  Substance Use Topics  . Alcohol use: No  . Drug use: No     Allergies   Patient has no known allergies.   Review of Systems Review of Systems  Reason unable to perform ROS: See HPI as above.     Physical Exam Triage Vital Signs ED Triage Vitals [07/19/17 1101]  Enc Vitals Group     BP (!) 98/59     Pulse Rate 64     Resp 18     Temp 98.6 F (37 C)     Temp src      SpO2 100 %     Weight      Height      Head Circumference      Peak Flow      Pain Score      Pain Loc      Pain Edu?      Excl. in [place]?    No data found.  Updated Vital Signs BP (!) 98/59   Pulse 64   Temp 98.6 F (37 C)   Resp 18   SpO2 100%   Physical Exam  Constitutional: He is oriented to person, place, and time. He appears well-developed and well-nourished. No  distress.  HENT:  Head: Normocephalic and atraumatic.  Right Ear: External ear and ear canal normal.  Left Ear: External ear and ear canal normal.  Bilateral tragus nontender to palpation.  Bilateral cerumen impaction, TM not visible.  Ear canal slightly erythematous  post ear irrigation.  TM visible bilaterally, pearly gray, without erythema, bulging.  Eyes: Conjunctivae are normal. Pupils are equal, round, and reactive to light.  Neurological: He is alert and oriented to person, place, and time.     UC Treatments / Results  Labs (all labs ordered are listed, but only abnormal results are displayed) Labs Reviewed - No data to display  EKG  EKG Interpretation None       Radiology No results found.  Procedures Procedures (including critical care time)  Medications Ordered in UC Medications - No data to display   Initial Impression / Assessment and Plan / UC Course  I have reviewed the triage vital signs and the nursing notes.  Pertinent labs &  imaging results that were available during my care of the patient were reviewed by me and considered in my medical decision making (see chart for details).    Patient with resolution of symptoms post ear irrigation.  Discussed treatment to prevent cerumen impaction.  Recheck as needed.  Final Clinical Impressions(s) / UC Diagnoses   Final diagnoses:  Bilateral impacted cerumen    ED Discharge Orders    None        Ok Edwards, PA-C 07/19/17 1204

## 2017-07-19 NOTE — Discharge Instructions (Signed)
Earwax removed.  No signs of ear infection. You can periodically use debrox and hydrogen peroxide to prevent similar buildup in the future. Recheck as needed.

## 2018-05-05 ENCOUNTER — Encounter (HOSPITAL_COMMUNITY): Payer: Self-pay | Admitting: Emergency Medicine

## 2018-05-05 ENCOUNTER — Ambulatory Visit (HOSPITAL_COMMUNITY)
Admission: EM | Admit: 2018-05-05 | Discharge: 2018-05-05 | Disposition: A | Discharge status: Home / Self Care | Payer: Self-pay | Attending: Family Medicine | Admitting: Family Medicine

## 2018-05-05 DIAGNOSIS — R1031 Right lower quadrant pain: Secondary | ICD-10-CM | POA: Insufficient documentation

## 2018-05-05 MED ORDER — IBUPROFEN 800 MG PO TABS: 800.0000 mg | ORAL_TABLET | Freq: Three times a day (TID) | ORAL | 0 refills | Status: DC

## 2018-05-05 MED ORDER — HYDROCODONE-ACETAMINOPHEN 7.5-325 MG PO TABS: 1.0000 | ORAL_TABLET | Freq: Four times a day (QID) | ORAL | 0 refills | Status: DC | PRN

## 2018-05-05 NOTE — ED Provider Notes (Signed)
[facility]    CSN: [id number] Arrival date & time: 05/05/18  1200     History   Chief Complaint Chief Complaint  Patient presents with  . Groin Pain    HPI David Berry is a 52 y.o. male.   HPI  Patient states he has had 3 different hernia surgeries.  He had the first 1 with mesh.  Another surgery to remove mesh.  Uncertain third surgery.  Today he is here for right groin pain.  He states it started suddenly yesterday while at work.  He works on a Forensic scientist.  He did not do any heavy lifting pushing or pulling.  He does not think it was work-related.  He is here today to have this evaluated, and to get an opinion on where to go next for follow-up.  He would like to see a Psychologist, sport and exercise in the [facility] system.  No problem with bowels or bladder.  No acute abdominal pain.  No nausea vomiting diarrhea or fever  History reviewed. No pertinent past medical history.  Patient Active Problem List   Diagnosis Date Noted  . BACK PAIN, LUMBAR 03/24/2010  . LUMBAR RADICULOPATHY 03/24/2010    History reviewed. No pertinent surgical history.     Home Medications    Prior to Admission medications   Medication Sig Start Date End Date Taking? Authorizing Provider  HYDROcodone-acetaminophen (NORCO) 7.5-325 MG tablet Take 1 tablet by mouth every 6 (six) hours as needed for moderate pain. 05/05/18   Raylene Everts, MD  ibuprofen (ADVIL,MOTRIN) 800 MG tablet Take 1 tablet (800 mg total) by mouth 3 (three) times daily. 05/05/18   Raylene Everts, MD    Family History History reviewed. No pertinent family history.  Social History Social History   Tobacco Use  . Smoking status: Current Every Day Smoker    Types: Cigars  . Smokeless tobacco: Never Used  . Tobacco comment: smokes 3 black and mild cigars per day  Substance Use Topics  . Alcohol use: No  . Drug use: No     Allergies   Patient has no known allergies.   Review of Systems Review of Systems    Constitutional: Negative for chills and fever.  HENT: Negative for ear pain and sore throat.   Eyes: Negative for pain and visual disturbance.  Respiratory: Negative for cough and shortness of breath.   Cardiovascular: Negative for chest pain and palpitations.  Gastrointestinal: Negative for abdominal pain and vomiting.  Genitourinary: Positive for testicular pain. Negative for dysuria and hematuria.  Musculoskeletal: Negative for arthralgias and back pain.  Skin: Negative for color change and rash.  Neurological: Negative for seizures and syncope.  All other systems reviewed and are negative.    Physical Exam Triage Vital Signs ED Triage Vitals [05/05/18 1330]  Enc Vitals Group     BP 105/75     Pulse Rate 77     Resp 18     Temp 97.9 F (36.6 C)     Temp Source Oral     SpO2 99 %     Weight      Height      Head Circumference      Peak Flow      Pain Score 7     Pain Loc      Pain Edu?      Excl. in [place]?    No data found.  Updated Vital Signs BP 105/75 (BP Location: Right Arm)   Pulse  77   Temp 97.9 F (36.6 C) (Oral)   Resp 18   SpO2 99%   Visual Acuity Right Eye Distance:   Left Eye Distance:   Bilateral Distance:    Right Eye Near:   Left Eye Near:    Bilateral Near:     Physical Exam Constitutional:      General: He is not in acute distress.    Appearance: He is well-developed.  HENT:     Head: Normocephalic and atraumatic.  Eyes:     Conjunctiva/sclera: Conjunctivae normal.     Pupils: Pupils are equal, round, and reactive to light.  Neck:     Musculoskeletal: Normal range of motion.  Cardiovascular:     Rate and Rhythm: Normal rate.  Pulmonary:     Effort: Pulmonary effort is normal. No respiratory distress.  Abdominal:     General: There is no distension.     Palpations: Abdomen is soft.  Genitourinary:    Penis: Normal.      Scrotum/Testes: Normal.     Comments: Mild tenderness right testicle.  No swelling noted.  Acute tenderness  in the left inguinal region.  Unable to appreciate looseness or herniation Musculoskeletal: Normal range of motion.  Skin:    General: Skin is warm and dry.  Neurological:     Mental Status: He is alert.      UC Treatments / Results  Labs (all labs ordered are listed, but only abnormal results are displayed) Labs Reviewed - No data to display  EKG None  Radiology No results found.  Procedures Procedures (including critical care time)  Medications Ordered in UC Medications - No data to display  Initial Impression / Assessment and Plan / UC Course  I have reviewed the triage vital signs and the nursing notes.  Pertinent labs & imaging results that were available during my care of the patient were reviewed by me and considered in my medical decision making (see chart for details).      Final Clinical Impressions(s) / UC Diagnoses   Final diagnoses:  Unilateral groin pain, right     Discharge Instructions     Avoid heavy lifting Take the ibuprofen 3 times a day with food Stronger pain medicine if needed Do not drive on the stronger pain medication Need to follow-up with the surgeon I recommend Dr. Rosendo Gros   ED Prescriptions    Medication Sig [place]. Provider   ibuprofen (ADVIL,MOTRIN) 800 MG tablet Take 1 tablet (800 mg total) by mouth 3 (three) times daily. 21 tablet Raylene Everts, MD   HYDROcodone-acetaminophen [facility]) 7.5-325 MG tablet Take 1 tablet by mouth every 6 (six) hours as needed for moderate pain. 15 tablet Raylene Everts, MD     Controlled Substance Prescriptions [place] Controlled Substance Registry consulted? Yes, I have consulted the [place] Controlled Substances Registry for this patient, and feel the risk/benefit ratio today is favorable for proceeding with this prescription for a controlled substance.   Raylene Everts, MD 05/05/18 [phone]

## 2018-05-05 NOTE — ED Triage Notes (Signed)
Pt here with right sided groin and testicle pain; pt sts hx of hernia sx with some complications

## 2018-05-05 NOTE — Discharge Instructions (Signed)
Avoid heavy lifting Take the ibuprofen 3 times a day with food Stronger pain medicine if needed Do not drive on the stronger pain medication Need to follow-up with the surgeon I recommend Dr. Rosendo Gros

## 2018-07-08 ENCOUNTER — Emergency Department (HOSPITAL_COMMUNITY)
Admission: EM | Admit: 2018-07-08 | Discharge: 2018-07-09 | Disposition: A | Discharge status: Home / Self Care | Payer: 59 | Attending: Emergency Medicine | Admitting: Emergency Medicine

## 2018-07-08 ENCOUNTER — Encounter (HOSPITAL_COMMUNITY): Payer: Self-pay

## 2018-07-08 DIAGNOSIS — K047 Periapical abscess without sinus: Secondary | ICD-10-CM | POA: Diagnosis not present

## 2018-07-08 DIAGNOSIS — K0889 Other specified disorders of teeth and supporting structures: Secondary | ICD-10-CM | POA: Diagnosis present

## 2018-07-08 DIAGNOSIS — F17290 Nicotine dependence, other tobacco product, uncomplicated: Secondary | ICD-10-CM | POA: Diagnosis not present

## 2018-07-08 NOTE — ED Provider Notes (Signed)
[place] EMERGENCY DEPARTMENT Provider Note   CSN: [id number] Arrival date & time: 07/08/18  2239    History   Chief Complaint Chief Complaint  Patient presents with  . Dental Pain    HPI David Berry is a 53 y.o. male with a history of lumbar radiculopathy who presents to the emergency department with a chief complaint of right lower dental pain.  The patient reports is a right-sided, lower dental pain that began 1 week ago.  Pain has been constant and gradually worsening since onset.  He reports that he has a tooth on the right lower side that is loose and needs to be pulled.  He reports that earlier today he developed swelling to his right jaw.  He reports that swelling has somewhat improved since onset.  He was taking ibuprofen for symptoms, but has not taken a dose since yesterday.  He is established with a dentist and has an appointment on March 19.  He denies purulent drainage, fever, chills, trismus, muffled voice, neck pain or swelling, feeling as if his throat is closing, shortness of breath, or neck stiffness.     The history is provided by the patient. No language interpreter was used.    History reviewed. No pertinent past medical history.  Patient Active Problem List   Diagnosis Date Noted  . BACK PAIN, LUMBAR 03/24/2010  . LUMBAR RADICULOPATHY 03/24/2010    History reviewed. No pertinent surgical history.      Home Medications    Prior to Admission medications   Medication Sig Start Date End Date Taking? Authorizing Provider  amoxicillin (AMOXIL) 500 MG capsule Take 1 capsule (500 mg total) by mouth 3 (three) times daily for 5 days. 07/09/18 07/14/18  ,  A, PA-C  HYDROcodone-acetaminophen (NORCO) 7.5-325 MG tablet Take 1 tablet by mouth every 6 (six) hours as needed for moderate pain. 05/05/18   Raylene Everts, MD  ibuprofen (ADVIL,MOTRIN) 800 MG tablet Take 1 tablet (800 mg total) by mouth 3 (three) times daily. 05/05/18    Raylene Everts, MD  lidocaine (XYLOCAINE) 2 % solution Use as directed 15 mLs in the mouth or throat every 3 (three) hours as needed for mouth pain. 07/09/18   ,  A, PA-C  oxyCODONE-acetaminophen (PERCOCET/ROXICET) 5-325 MG tablet Take 1 tablet by mouth every 8 (eight) hours as needed for severe pain. 07/09/18   , Laymond Purser, PA-C    Family History History reviewed. No pertinent family history.  Social History Social History   Tobacco Use  . Smoking status: Current Every Day Smoker    Types: Cigars  . Smokeless tobacco: Never Used  . Tobacco comment: smokes 3 black and mild cigars per day  Substance Use Topics  . Alcohol use: No  . Drug use: No     Allergies   Patient has no known allergies.   Review of Systems Review of Systems  Constitutional: Negative for activity change, chills and fever.  HENT: Positive for dental problem and facial swelling. Negative for sore throat, tinnitus, trouble swallowing and voice change.   Respiratory: Negative for shortness of breath.   Cardiovascular: Negative for chest pain.  Gastrointestinal: Negative for abdominal pain.  Musculoskeletal: Negative for back pain.  Skin: Negative for rash.     Physical Exam Updated Vital Signs BP (!) 142/83 (BP Location: Right Arm)   Pulse 74   Temp 98.7 F (37.1 C) (Oral)   Resp 16   SpO2 99%   Physical  Exam Vitals signs and nursing note reviewed.  Constitutional:      Appearance: He is well-developed.     Comments: Appears uncomfortable  HENT:     Head: Normocephalic.      Comments: Mild fluctuance and induration noted along the right jaw.    Mouth/Throat:     Mouth: Mucous membranes are moist. No injury or lacerations.     Dentition: Abnormal dentition. Dental tenderness, gingival swelling, dental caries and dental abscesses present.     Pharynx: Oropharynx is clear. Uvula midline. No pharyngeal swelling, oropharyngeal exudate, posterior oropharyngeal erythema or uvula  swelling.     Tonsils: No tonsillar exudate or tonsillar abscesses.      Comments: Tooth #29 is loose and tender to palpation.  Tenderness, erythema, and mild fluctuance of the buccal gingiva of teeth 29 and 30.  No active purulent discharge or purulent discharge able to be expressed.  Poor dentition throughout.  No sublingual or submental induration. Eyes:     Conjunctiva/sclera: Conjunctivae normal.  Neck:     Musculoskeletal: Normal range of motion and neck supple. No neck rigidity or muscular tenderness.  Cardiovascular:     Rate and Rhythm: Normal rate and regular rhythm.     Heart sounds: No murmur.  Pulmonary:     Effort: Pulmonary effort is normal.  Abdominal:     General: There is no distension.     Palpations: Abdomen is soft.  Lymphadenopathy:     Cervical: No cervical adenopathy.  Skin:    General: Skin is warm and dry.  Neurological:     Mental Status: He is alert.  Psychiatric:        Behavior: Behavior normal.      ED Treatments / Results  Labs (all labs ordered are listed, but only abnormal results are displayed) Labs Reviewed - No data to display  EKG None  Radiology No results found.  Procedures Procedures (including critical care time)  Medications Ordered in ED Medications  lidocaine (XYLOCAINE) 2 % viscous mouth solution 15 mL (15 mLs Mouth/Throat Given 07/09/18 0039)     Initial Impression / Assessment and Plan / ED Course  I have reviewed the triage vital signs and the nursing notes.  Pertinent labs & imaging results that were available during my care of the patient were reviewed by me and considered in my medical decision making (see chart for details).        53 year old male with history of lumbar radiculopathy presenting with right mandibular dental pain for the last week with facial swelling, onset today.  Facial swelling has improved since onset.  He is established with a dentist and has a follow-up appointment on March 19.  He was  taking ibuprofen, but has not had a dose since yesterday.  On exam, the patient is uncomfortable, but has no trismus, muffled voice, or sublingual or submental induration.  Mild fluctuance noted to the right jaw.  There is a small dental abscess that is not actively draining.  Shared decision making conversation with the patient.  I offered to perform a dental block, offered an I&D, and offered Toradol in the ER versus initiating antibiotics and calling his dentist for follow-up on Monday to reevaluate the area after he has been on antibiotics for almost 48 hours.  The patient opted to start antibiotics and follow-up with his dentist on Monday.  The patient drove himself to the ER, and I am unable to provide him with anything stronger than NSAIDs.  Will  provide him with viscous lidocaine and discharge him with the same as well as pain medication and doxycycline. A 20-month prescription history query was performed using the [place] CSRS prior to discharge.  Strict return precautions given.  He is hemodynamically stable and in no acute distress.  Safe for discharge to home with outpatient follow-up at this time.   Final Clinical Impressions(s) / ED Diagnoses   Final diagnoses:  Dental abscess    ED Discharge Orders         Ordered    lidocaine (XYLOCAINE) 2 % solution  Every  3 hours PRN     07/09/18 0032    amoxicillin (AMOXIL) 500 MG capsule  3 times daily     07/09/18 0032    oxyCODONE-acetaminophen (PERCOCET/ROXICET) 5-325 MG tablet  Every 8 hours PRN     07/09/18 0032           , Maree Erie A, PA-C 07/09/18 [place], April, MD 07/09/18 2500

## 2018-07-08 NOTE — ED Triage Notes (Signed)
Onset this morning right jaw swelling.  Per patient he has a tooth that needs to be pulled on the lower right

## 2018-07-09 MED ORDER — LIDOCAINE VISCOUS HCL 2 % MT SOLN
15.0000 mL | Freq: Once | OROMUCOSAL | Status: AC
  Administered 2018-07-09: 15 mL via OROMUCOSAL
  Filled 2018-07-09: qty 15

## 2018-07-09 MED ORDER — AMOXICILLIN 500 MG PO CAPS: 500.0000 mg | ORAL_CAPSULE | Freq: Three times a day (TID) | ORAL | 0 refills | Status: AC

## 2018-07-09 MED ORDER — OXYCODONE-ACETAMINOPHEN 5-325 MG PO TABS: 1.0000 | ORAL_TABLET | Freq: Three times a day (TID) | ORAL | 0 refills | Status: DC | PRN

## 2018-07-09 MED ORDER — LIDOCAINE VISCOUS HCL 2 % MT SOLN: 15.0000 mL | OROMUCOSAL | 0 refills | Status: DC | PRN

## 2018-07-09 NOTE — Discharge Instructions (Addendum)
Thank you for allowing me to care for you today in the Emergency Department.   Call your dental office when they reopen on Monday to see if you can get a sooner appointment.  I have also provided you with a dental resource list to see if you can be seen sooner by another dentist.  Amoxicillin is an antibiotic.  Take 1 tablet every 8 hours for the next 5 days to treat your infection.  Do not stop taking this medication early even if your symptoms improve.  It is important to take the entire course to prevent antibiotic resistance for future infections.  Swish and spit with warm salt water every 6 hours to prevent worsening infection.  Apply an ice pack or cool compresses to the side of the face to help reduce swelling and pain.  Avoid applying heat because this can cause worsening swelling.  Ibuprofen can also help with the swelling.  You can take 800 mg with food every 8 hours or 650 mg of Tylenol every 8 hours or alternate between these 2 medications every 4 hours.  You can swish and spit 15 mL's of viscous lidocaine every 3 hours as needed for mouth pain.  For severe, uncontrollable pain, you can take 1 tablet of Percocet as needed every 8 hours.  Percocet also contains 325 mg of Tylenol in each tablet.  You should not take more than 4000 mg of Tylenol from all sources in a 24-hour period.  Percocet is a narcotic and can be addicting.  Do not work or drive while taking this medication because it can cause you to be impaired.  You should only take this medication when your pain is severe and is not controlled by other sources.  Return to the emergency department if you become unable to open your mouth, develop significant swelling to one side of the neck, if you begin to feel short of breath, feel as if your throat is closing, develop worsening swelling, fever, or chills, particularly after you have been taking antibiotics for 48 hours, or other new, concerning symptoms.

## 2018-07-10 ENCOUNTER — Encounter: Payer: Self-pay | Admitting: Diagnostic Neuroimaging

## 2018-07-10 ENCOUNTER — Ambulatory Visit (INDEPENDENT_AMBULATORY_CARE_PROVIDER_SITE_OTHER): Payer: 59 | Admitting: Diagnostic Neuroimaging

## 2018-07-10 VITALS — BP 100/67 | HR 83 | Ht 70.0 in | Wt 131.2 lb

## 2018-07-10 DIAGNOSIS — R1031 Right lower quadrant pain: Secondary | ICD-10-CM

## 2018-07-10 DIAGNOSIS — Z8719 Personal history of other diseases of the digestive system: Secondary | ICD-10-CM | POA: Diagnosis not present

## 2018-07-10 DIAGNOSIS — Z9889 Other specified postprocedural states: Secondary | ICD-10-CM

## 2018-07-10 NOTE — Progress Notes (Signed)
[facility]  PATIENT: David Berry DOB: 05/11/1965  REFERRING CLINICIAN: Rosendo Gros, A HISTORY FROM: patient  REASON FOR VISIT: new consult    HISTORICAL  CHIEF COMPLAINT:  Chief Complaint  Patient presents with  . Pain    rm 7, New Pt, "hx 3 right inguinal hernia surgeries- possible nerve pain in right groin, occasional numbness/swelling"    HISTORY OF PRESENT ILLNESS:   53 year old male here for evaluation of right groin and testicular pain.  Patient developed right groin and testicular pain in May 2016.  He was diagnosed with inguinal hernia.  This was treated with surgical repair in September 2016.  Unfortunately he did not get a benefit from surgery.  In 2017 he had a second surgery without benefit.  In 2018 he had surgical mesh removal surgery without relief of symptoms.  Since that time symptoms have been persistent.  In January 2020 he had a another surgical opinion by Dr. Rosendo Gros.  No further surgical treatable option was recommended.  Patient was referred here for consideration of other testing or evaluation.  Patient describes persistent numbness, pain, discomfort in his right groin region radiating to his right testicle.  He has also noted intermittent swelling of his right testicle.  He has had urology consultation in 2017 including ultrasound of the scrotum as well as CT of the pelvis which were otherwise unremarkable.  He had urinalysis done at that time which showed red blood cells in the urine in 2017.  No problems with face, hands, feet or legs.  No neck pain or low back pain.  Patient does not have a primary care doctor.  No weakness, slurred speech, bowel or bladder dysfunction.   REVIEW OF SYSTEMS: Full 14 system review of systems performed and negative with exception of: Weight loss blurred vision constipation impotence decreased energy change appetite restless legs.   ALLERGIES: Allergies  Allergen Reactions  . Pregabalin Other (See  Comments)    Homicidal idealizations, pain in lower extremities      HOME MEDICATIONS: Outpatient Medications Prior to Visit  Medication Sig Dispense Refill  . amoxicillin (AMOXIL) 500 MG capsule Take 1 capsule (500 mg total) by mouth 3 (three) times daily for 5 days. 15 capsule 0  . ibuprofen (ADVIL,MOTRIN) 800 MG tablet Take 1 tablet (800 mg total) by mouth 3 (three) times daily. 21 tablet 0  . lidocaine (XYLOCAINE) 2 % solution Use as directed 15 mLs in the mouth or throat every 3 (three) hours as needed for mouth pain. 100 mL 0  . oxyCODONE-acetaminophen (PERCOCET/ROXICET) 5-325 MG tablet Take 1 tablet by mouth every 8 (eight) hours as needed for severe pain. 9 tablet 0  . HYDROcodone-acetaminophen (NORCO) 7.5-325 MG tablet Take 1 tablet by mouth every 6 (six) hours as needed for moderate pain. 15 tablet 0   No facility-administered medications prior to visit.     PAST MEDICAL HISTORY: No past medical history on file.  PAST SURGICAL HISTORY: Past Surgical History:  Procedure Laterality Date  . APPENDECTOMY     teenager  . INGUINAL HERNIA REPAIR Right 2016, 2017   [facility], 2018 [facility]-removed mesh    FAMILY HISTORY: No family history on file.  SOCIAL HISTORY: Social History   Socioeconomic History  . Marital status: Single    Spouse name: Not on file  . Number of children: 1  . Years of education: GED  . Highest education level: Not on file  Occupational History    Comment: fork lift  driver  Social Needs  . Financial resource strain: Not on file  . Food insecurity:    Worry: Not on file    Inability: Not on file  . Transportation needs:    Medical: Not on file    Non-medical: Not on file  Tobacco Use  . Smoking status: Current Every Day Smoker    Types: Cigars  . Smokeless tobacco: Never Used  . Tobacco comment: smokes 3 black and mild cigars per day  Substance and Sexual Activity  . Alcohol use: No  . Drug use: No  . Sexual activity: Yes    Lifestyle  . Physical activity:    Days per week: Not on file    Minutes per session: Not on file  . Stress: Not on file  Relationships  . Social connections:    Talks on phone: Not on file    Gets together: Not on file    Attends religious service: Not on file    Active member of club or organization: Not on file    Attends meetings of clubs or organizations: Not on file    Relationship status: Not on file  . Intimate partner violence:    Fear of current or ex partner: Not on file    Emotionally abused: Not on file    Physically abused: Not on file    Forced sexual activity: Not on file  Other Topics Concern  . Not on file  Social History Narrative   Lives alone   Caffeine- coffee, 20 oz daily, tea occas     PHYSICAL EXAM  GENERAL EXAM/CONSTITUTIONAL: Vitals:  Vitals:   07/10/18 0813  BP: 100/67  Pulse: 83  Weight: 131 lb 3.2 oz (59.5 kg)  Height: 5\' 10"  (1.778 m)     Body mass index is 18.83 kg/m. Wt Readings from Last 3 Encounters:  07/10/18 131 lb 3.2 oz (59.5 kg)  06/14/12 135 lb (61.2 kg)  05/17/12 135 lb (61.2 kg)     Patient is in no distress; well developed, nourished and groomed; neck is supple  CARDIOVASCULAR:  Examination of carotid arteries is normal; no carotid bruits  Regular rate and rhythm, no murmurs  Examination of peripheral vascular system by observation and palpation is normal  EYES:  Ophthalmoscopic exam of optic discs and posterior segments is normal; no papilledema or hemorrhages  Visual Acuity Screening   Right eye Left eye Both eyes  Without correction: 20/100 20/100   With correction:     Comments: Does not own glasses    MUSCULOSKELETAL:  Gait, strength, tone, movements noted in Neurologic exam below  NEUROLOGIC: MENTAL STATUS:  No flowsheet data found.  awake, alert, oriented to person, place and time  recent and remote memory intact  normal attention and concentration  language fluent, comprehension  intact, naming intact  fund of knowledge appropriate  CRANIAL NERVE:   2nd - no papilledema on fundoscopic exam  2nd, 3rd, 4th, 6th - pupils equal and reactive to light, visual fields full to confrontation, extraocular muscles intact, no nystagmus  5th - facial sensation symmetric  7th - facial strength symmetric  8th - hearing intact  9th - palate elevates symmetrically, uvula midline  11th - shoulder shrug symmetric  12th - tongue protrusion midline  MOTOR:   normal bulk and tone, full strength in the BUE, BLE  SENSORY:   normal and symmetric to light touch  COORDINATION:   finger-nose-finger, fine finger movements normal  REFLEXES:   deep tendon reflexes  present and symmetric  GAIT/STATION:   narrow based gait; ROMBERG NEGATIVE     DIAGNOSTIC DATA (LABS, IMAGING, TESTING) - I reviewed patient records, labs, notes, testing and imaging myself where available.  No results found for: WBC, HGB, HCT, MCV, PLT No results found for: NA, K, CL, CO2, GLUCOSE, BUN, CREATININE, CALCIUM, PROT, ALBUMIN, AST, ALT, ALKPHOS, BILITOT, GFRNONAA, GFRAA No results found for: CHOL, HDL, LDLCALC, LDLDIRECT, TRIG, CHOLHDL No results found for: HGBA1C No results found for: VITAMINB12 No results found for: TSH   06/21/16 u/s scrotum / testes - Unremarkable scrotal ultrasound. Arterial and venous flow are documented by spectral Doppler analysis in both testes.  06/21/16 CT pelvis - Postsurgical changes of right inguinal hernia repair without evidence of recurrent visceral protrusion.    ASSESSMENT AND PLAN  53 y.o. year old male here with:  Dx: right groin / testicle pain (s/p surgery x 3) --> likely sequelae from prior inguinal hernia  1. Right groin pain   2. S/P hernia repair      PLAN:  RIGHT GROIN PAIN - establish with PCP - consider topical lidocaine - may need to consider pain mgmt evaluation  INTERMITTENT RIGHT TESTICULAR SWELLING - follow up with PCP  and urology  DENTAL ABSCESS - follow up with dentist asap  Return for return to PCP, pending if symptoms worsen or fail to improve.    Penni Bombard, MD 07/17/7671, 4:19 AM Certified in Neurology, Neurophysiology and Neuroimaging  [facility] [address] [place], [place] [address] [phone]

## 2018-07-10 NOTE — Patient Instructions (Signed)
RIGHT GROIN PAIN - establish with PCP - consider topical lidocaine - may need to consider pain mgmt clinic evaluation  INTERMITTENT RIGHT TESTICULAR SWELLING - follow up with PCP and urology

## 2019-01-03 ENCOUNTER — Ambulatory Visit: Payer: 59 | Admitting: Orthopaedic Surgery

## 2019-01-20 ENCOUNTER — Emergency Department (HOSPITAL_COMMUNITY): Payer: Medicaid Other

## 2019-01-20 ENCOUNTER — Encounter (HOSPITAL_COMMUNITY): Payer: Self-pay | Admitting: Emergency Medicine

## 2019-01-20 ENCOUNTER — Inpatient Hospital Stay (HOSPITAL_COMMUNITY)
Admission: EM | Admit: 2019-01-20 | Discharge: 2019-01-29 | DRG: 181 | Disposition: A | Discharge status: Home / Self Care | Payer: Medicaid Other | Attending: Family Medicine | Admitting: Family Medicine

## 2019-01-20 ENCOUNTER — Other Ambulatory Visit: Payer: Self-pay | Admitting: Interventional Radiology

## 2019-01-20 ENCOUNTER — Other Ambulatory Visit: Payer: Self-pay

## 2019-01-20 DIAGNOSIS — G5600 Carpal tunnel syndrome, unspecified upper limb: Secondary | ICD-10-CM | POA: Diagnosis present

## 2019-01-20 DIAGNOSIS — C7901 Secondary malignant neoplasm of right kidney and renal pelvis: Secondary | ICD-10-CM | POA: Diagnosis present

## 2019-01-20 DIAGNOSIS — I82C11 Acute embolism and thrombosis of right internal jugular vein: Secondary | ICD-10-CM | POA: Diagnosis present

## 2019-01-20 DIAGNOSIS — R1310 Dysphagia, unspecified: Secondary | ICD-10-CM | POA: Diagnosis present

## 2019-01-20 DIAGNOSIS — Y92239 Unspecified place in hospital as the place of occurrence of the external cause: Secondary | ICD-10-CM | POA: Diagnosis present

## 2019-01-20 DIAGNOSIS — R2241 Localized swelling, mass and lump, right lower limb: Secondary | ICD-10-CM

## 2019-01-20 DIAGNOSIS — Z79899 Other long term (current) drug therapy: Secondary | ICD-10-CM

## 2019-01-20 DIAGNOSIS — R4586 Emotional lability: Secondary | ICD-10-CM | POA: Diagnosis not present

## 2019-01-20 DIAGNOSIS — R222 Localized swelling, mass and lump, trunk: Secondary | ICD-10-CM

## 2019-01-20 DIAGNOSIS — T8089XA Other complications following infusion, transfusion and therapeutic injection, initial encounter: Secondary | ICD-10-CM | POA: Diagnosis present

## 2019-01-20 DIAGNOSIS — Z888 Allergy status to other drugs, medicaments and biological substances status: Secondary | ICD-10-CM

## 2019-01-20 DIAGNOSIS — R7989 Other specified abnormal findings of blood chemistry: Secondary | ICD-10-CM | POA: Diagnosis present

## 2019-01-20 DIAGNOSIS — I82210 Acute embolism and thrombosis of superior vena cava: Secondary | ICD-10-CM | POA: Diagnosis present

## 2019-01-20 DIAGNOSIS — C3411 Malignant neoplasm of upper lobe, right bronchus or lung: Principal | ICD-10-CM | POA: Diagnosis present

## 2019-01-20 DIAGNOSIS — I8290 Acute embolism and thrombosis of unspecified vein: Secondary | ICD-10-CM | POA: Diagnosis present

## 2019-01-20 DIAGNOSIS — J9859 Other diseases of mediastinum, not elsewhere classified: Secondary | ICD-10-CM

## 2019-01-20 DIAGNOSIS — Z79891 Long term (current) use of opiate analgesic: Secondary | ICD-10-CM

## 2019-01-20 DIAGNOSIS — F17290 Nicotine dependence, other tobacco product, uncomplicated: Secondary | ICD-10-CM | POA: Diagnosis present

## 2019-01-20 DIAGNOSIS — Z8042 Family history of malignant neoplasm of prostate: Secondary | ICD-10-CM

## 2019-01-20 DIAGNOSIS — I871 Compression of vein: Secondary | ICD-10-CM | POA: Diagnosis present

## 2019-01-20 DIAGNOSIS — E049 Nontoxic goiter, unspecified: Secondary | ICD-10-CM | POA: Diagnosis present

## 2019-01-20 DIAGNOSIS — C7989 Secondary malignant neoplasm of other specified sites: Secondary | ICD-10-CM | POA: Diagnosis present

## 2019-01-20 DIAGNOSIS — R600 Localized edema: Secondary | ICD-10-CM | POA: Diagnosis present

## 2019-01-20 DIAGNOSIS — Z791 Long term (current) use of non-steroidal anti-inflammatories (NSAID): Secondary | ICD-10-CM

## 2019-01-20 DIAGNOSIS — Z20828 Contact with and (suspected) exposure to other viral communicable diseases: Secondary | ICD-10-CM | POA: Diagnosis present

## 2019-01-20 DIAGNOSIS — C3490 Malignant neoplasm of unspecified part of unspecified bronchus or lung: Secondary | ICD-10-CM

## 2019-01-20 DIAGNOSIS — E010 Iodine-deficiency related diffuse (endemic) goiter: Secondary | ICD-10-CM | POA: Diagnosis present

## 2019-01-20 DIAGNOSIS — T82898A Other specified complication of vascular prosthetic devices, implants and grafts, initial encounter: Secondary | ICD-10-CM

## 2019-01-20 DIAGNOSIS — N2889 Other specified disorders of kidney and ureter: Secondary | ICD-10-CM

## 2019-01-20 DIAGNOSIS — R21 Rash and other nonspecific skin eruption: Secondary | ICD-10-CM | POA: Diagnosis present

## 2019-01-20 DIAGNOSIS — D630 Anemia in neoplastic disease: Secondary | ICD-10-CM | POA: Diagnosis present

## 2019-01-20 DIAGNOSIS — R221 Localized swelling, mass and lump, neck: Secondary | ICD-10-CM

## 2019-01-20 LAB — CBC WITH DIFFERENTIAL/PLATELET
Abs Immature Granulocytes: 0.03 10*3/uL (ref 0.00–0.07)
Basophils Absolute: 0.1 10*3/uL (ref 0.0–0.1)
Basophils Relative: 1 %
Eosinophils Absolute: 0.1 10*3/uL (ref 0.0–0.5)
Eosinophils Relative: 1 %
HCT: 37.4 % — ABNORMAL LOW (ref 39.0–52.0)
Hemoglobin: 12 g/dL — ABNORMAL LOW (ref 13.0–17.0)
Immature Granulocytes: 0 %
Lymphocytes Relative: 19 %
Lymphs Abs: 1.7 10*3/uL (ref 0.7–4.0)
MCH: 27.3 pg (ref 26.0–34.0)
MCHC: 32.1 g/dL (ref 30.0–36.0)
MCV: 85 fL (ref 80.0–100.0)
Monocytes Absolute: 1.2 10*3/uL — ABNORMAL HIGH (ref 0.1–1.0)
Monocytes Relative: 13 %
Neutro Abs: 6 10*3/uL (ref 1.7–7.7)
Neutrophils Relative %: 66 %
Platelets: 382 10*3/uL (ref 150–400)
RBC: 4.4 MIL/uL (ref 4.22–5.81)
RDW: 14.7 % (ref 11.5–15.5)
WBC: 9 10*3/uL (ref 4.0–10.5)
nRBC: 0 % (ref 0.0–0.2)

## 2019-01-20 LAB — BASIC METABOLIC PANEL
Anion gap: 13 (ref 5–15)
BUN: 8 mg/dL (ref 6–20)
CO2: 21 mmol/L — ABNORMAL LOW (ref 22–32)
Calcium: 10 mg/dL (ref 8.9–10.3)
Chloride: 103 mmol/L (ref 98–111)
Creatinine, Ser: 0.71 mg/dL (ref 0.61–1.24)
GFR calc Af Amer: >60 mL/min (ref >60)
GFR calc non Af Amer: >60 mL/min (ref >60)
Glucose, Bld: 87 mg/dL (ref 70–99)
Potassium: 4.4 mmol/L (ref 3.5–5.1)
Sodium: 137 mmol/L (ref 135–145)

## 2019-01-20 LAB — PROTIME-INR
INR: 1.2 (ref 0.8–1.2)
Prothrombin Time: 14.8 seconds (ref 11.4–15.2)

## 2019-01-20 LAB — APTT: aPTT: 30 seconds (ref 24–36)

## 2019-01-20 LAB — TSH: TSH: 2.02 u[IU]/mL (ref 0.350–4.500)

## 2019-01-20 LAB — SARS CORONAVIRUS 2 BY RT PCR (HOSPITAL ORDER, PERFORMED IN ~~LOC~~ HOSPITAL LAB): SARS Coronavirus 2: NEGATIVE

## 2019-01-20 IMAGING — CT CT ANGIO NECK
1 of 14 series · 2 of 16 positions shown · IV contrast (APPLIED)
Comparison: CTA chest reported separately. Thyroid ultrasound
earlier today.

CLINICAL DATA: 53-year-old male with unexplained neck swelling for
3 months. Evidence of right IJ occlusion on thyroid ultrasound
earlier today.

EXAM:
CT ANGIOGRAPHY NECK
TECHNIQUE: Multidetector CT imaging of the neck was performed using the
standard protocol during bolus administration of intravenous
contrast. Multiplanar CT image reconstructions and MIPs were
obtained to evaluate the vascular anatomy. Carotid stenosis
measurements (when applicable) are obtained utilizing NASCET
criteria, using the distal internal carotid diameter as the
denominator.
CONTRAST:  100mL OMNIPAQUE IOHEXOL 350 MG/ML SOLN

[Series 13: thins · axial · 0.69mm/px · z∈[-340,-71]mm · 2 of 385 slices shown]
[im 1/385  soft-tissue]
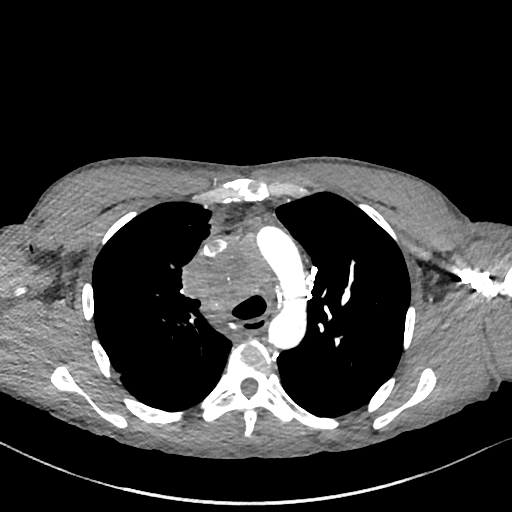
[im 385/385  bone]
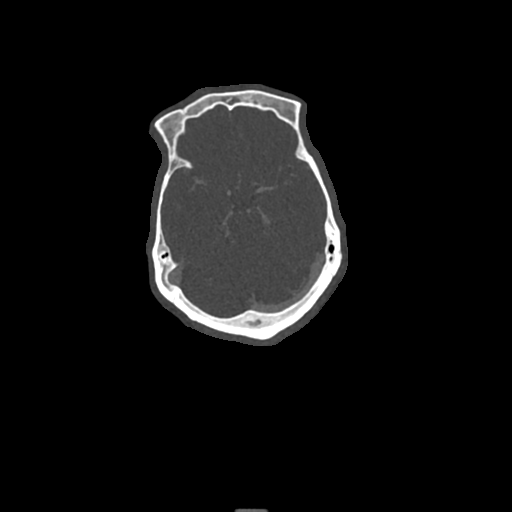

[2 of 16 positions shown; findings below may reference images not displayed]

FINDINGS: Skeleton: No acute or suspicious osseous lesion from the skull base
to the upper chest. Mucous retention cyst in the left maxillary
sinus. Mild right mastoid effusion.

Upper chest: Mediastinal mass/lymphadenopathy, reported separately
today.

Other neck: Bulky but indistinct soft tissue masses tracking
cephalad in the right neck from the thoracic inlet. Involvement of
the right level 4, right level 5, right level 3 and right level 2
nodal stations. Superimposed subcutaneous edema in the region.
Estimated individual lymph nodes/soft tissue mass size up to 34
millimeters long axis, 23 millimeters short axis.

The level 1 nodal station seems to be spared. The left neck appears
relatively spared.

Mild leftward mass effect on the trachea and pharynx. The thyroid
and pharyngeal soft tissue contours remain within normal limits. The
glottis is closed. The parapharyngeal spaces are normal. And
superior retropharyngeal space there is edema/effusion in the lower
retropharyngeal space. The sublingual, submandibular and parotid
spaces appear spared.

Grossly negative visible brain parenchyma. Visualized orbit soft
tissues are within normal limits.

Aortic arch: 3 vessel arch configuration.  No arch atherosclerosis.

Right carotid system: Mass effect on the brachiocephalic artery and
to a lesser extent the right carotid space from the chest and neck
soft tissue masses. No associated arterial stenosis. Negative right
carotid bifurcation and cervical right ICA. Visible right ICA siphon
is normal.

Left carotid system: Negative aside from mild left ICA tortuosity in
the neck. Negative visible left ICA siphon.

Vertebral arteries:
Mild mass effect on the proximal right subclavian artery due to the
chest and neck soft tissue masses. No right subclavian stenosis.
Normal right vertebral artery origin. Patent and normal right
vertebral artery to the vertebrobasilar junction. Negative visible
basilar artery.

Negative proximal left subclavian artery and left vertebral artery
origin. Mildly non dominant left vertebral artery is patent and
negative to the vertebrobasilar junction.

Other findings: Delayed venous phase images were also obtained. The
right sigmoid sinus and right IJ bulb are patent at the skull base,
but the right jugular vein is occluded beginning at the level of the
right retromandibular vein, and continuing to the right
subclavian/innominate vein confluence. The right innominate vein is
occluded in the upper chest on series 19, image 113.

The left innominate and left internal jugular vein remain patent.
The left sigmoid and transverse sinus are patent.

Review of the MIP images confirms the above findings
IMPRESSION: 1. Bulky soft tissue masses in the right neck and continuing into
the upper chest most compatible with extensive metastatic
lymphadenopathy. Individual nodal masses up to 34 mm.
2. Associated thrombosis of the Right IJ, Right Innominate Vein, and
likely also the SVC - See Chest CTA reported separately.
3. Mild retropharyngeal effusion likely related to #2.
4. No arterial abnormality in the neck.

## 2019-01-20 IMAGING — CT CT ANGIO CHEST
4 of 26 series · 10 of 48 positions shown · IV contrast (APPLIED)
Comparison: Thyroid ultrasound [DATE]
COMPARISON: Thyroid ultrasound [DATE]

Addendum:
CLINICAL DATA: Jugular vein thrombosis

EXAM:
CT ANGIOGRAPHY CHEST WITH CONTRAST
TECHNIQUE: Multidetector CT imaging of the chest was performed using the
standard protocol during bolus administration of intravenous
contrast. Multiplanar CT image reconstructions and MIPs were
obtained to evaluate the vascular anatomy.
CONTRAST:  100mL OMNIPAQUE IOHEXOL 350 MG/ML SOLN

[Series 7: thins · axial · 0.69mm/px · z∈[-470,-310]mm · 3 of 459 slices shown (1 of 4)]
[im 115/459  lung]
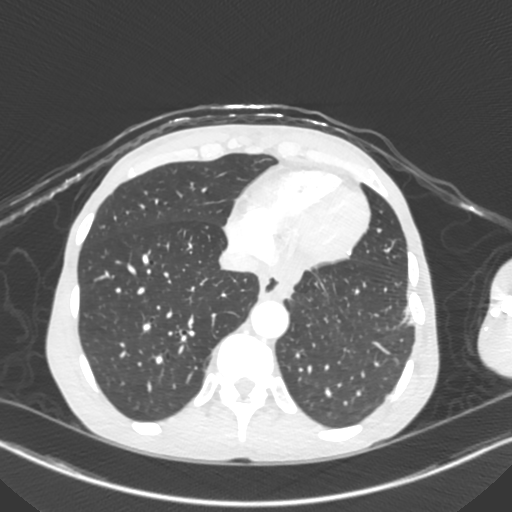
[im 230/459  lung]
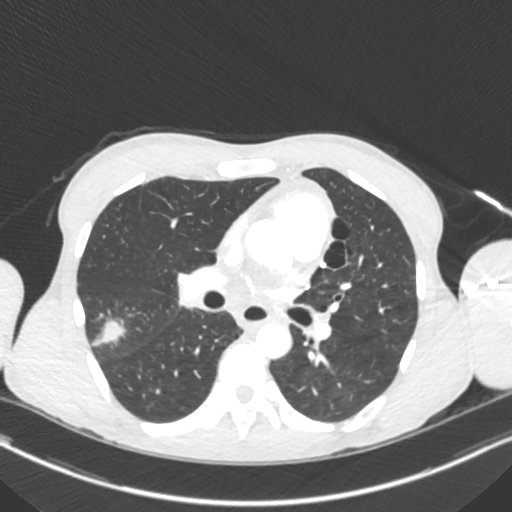
[im 344/459  lung]
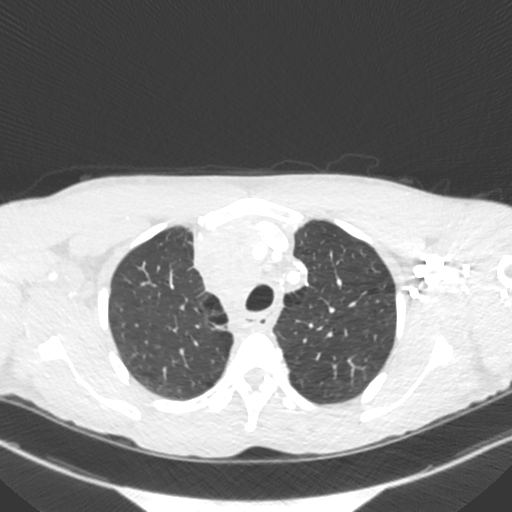

[Series 7: thins · axial · 0.69mm/px · z∈[-470,-310]mm · 3 of 459 slices shown (2 of 4)]
[im 115/459  lung]
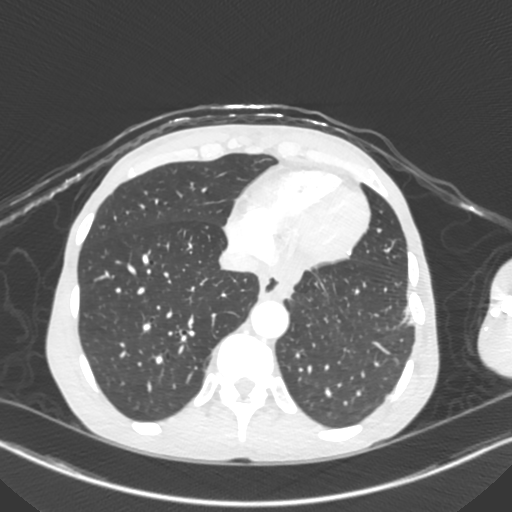
[im 230/459  soft-tissue]
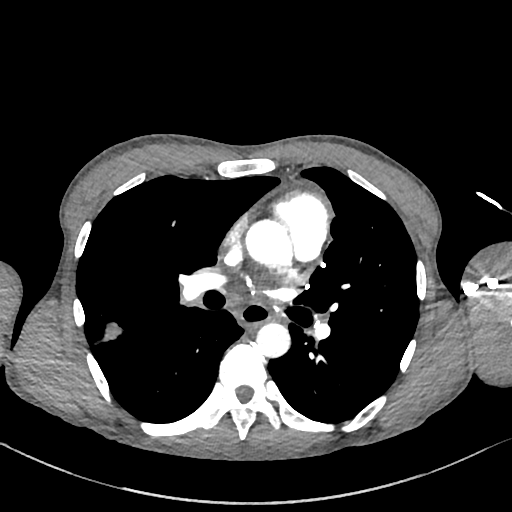
[im 344/459  lung]
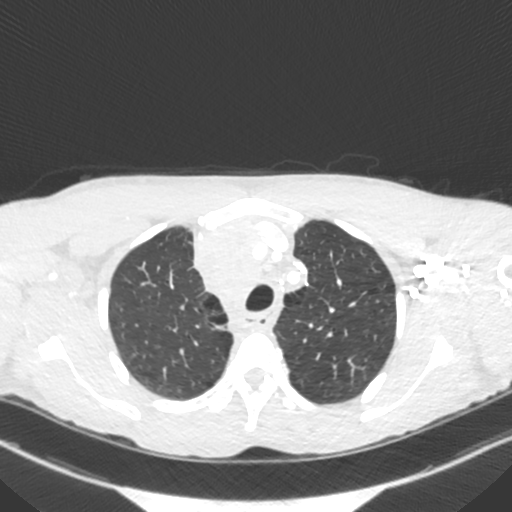

[Series 13: thins · axial · 0.69mm/px · z∈[-250,-161]mm · 2 of 385 slices shown (3 of 4)]
[im 129/385  lung]
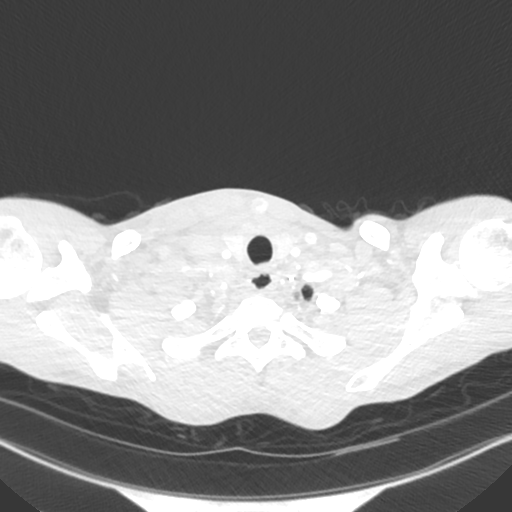
[im 257/385  lung]
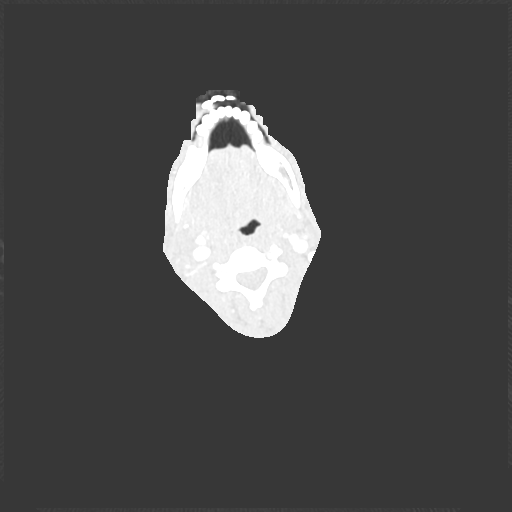

[Series 21: thins · axial · 0.64mm/px · z∈[-248,-158]mm · 2 of 372 slices shown (4 of 4)]
[im 124/372  lung]
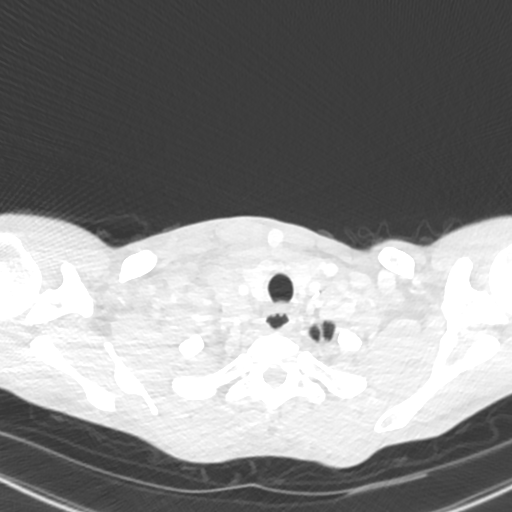
[im 248/372  lung]
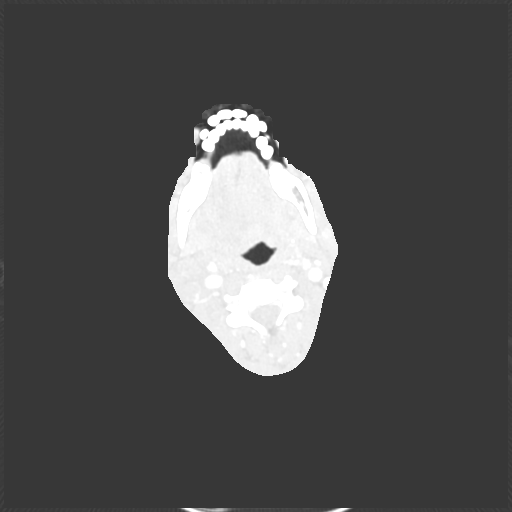

[10 of 48 positions shown; findings below may reference images not displayed]

FINDINGS: Cardiovascular: Satisfactory opacification of the pulmonary arteries
to the segmental level. No evidence of pulmonary embolism.
Nonaneurysmal aorta. No dissection. Normal heart size. No
significant pericardial effusion.

Occluded right jugular and probable subclavian veins. Narrowed
appearance of the left brachiocephalic vein as it approaches the
venous confluence. Narrowed and likely subtotal occlusion of the
upper SVC. Patency of the SVC as it enters the right atrium. Severe
narrowing of right upper lobe pulmonary arterial vessels. Numerous
collateral vessels within the mediastinum.

Mediastinum/Nodes: Midline trachea.  No discrete thyroid nodule.

Bulky malignant-appearing anterior and middle mediastinal mass,
infiltrative and poorly defined. Mass measures approximately 6.2 cm
AP by 5.6 cm transverse by 7.9 cm craniocaudad. It is contiguous
with additional mass or nodes in the right hilar region. The mass
encases/narrows the right upper lobe pulmonary vessels as well as
the superior vena cava. Probable tumor invasion of the distal left
brachiocephalic vessel and presumed tumor occlusion of the right
brachiocephalic and jugular vessels. Partially visualized enlarged
right supraclavicular nodes. Generalized edema within the soft
tissues of the thoracic inlet and base of right neck. Fluid-filled
esophagus with mild distal circumferential esophageal thickening.

Lungs/Pleura: Emphysema. Presumed left greater than right apical
scarring with left apical calcification. Posterior right upper lobe
subpleural nodular focus of airspace disease measuring 13 mm, series
6, image number 55. Irregular focus of airspace disease, also within
the right upper lobe abutting the fissure, series 6, image number
79. No pleural effusion or pneumothorax.

Upper Abdomen: No acute abnormality.

Musculoskeletal: No chest wall abnormality. No acute or significant
osseous findings.

Review of the MIP images confirms the above findings.
IMPRESSION: 1. No acute pulmonary embolus or aortic dissection.
2. Large poorly defined malignant-appearing infiltrative mass
measuring approximately 6.2 x 5.6 x 7.9 cm within the right anterior
and middle mediastinum. Suspected invasion of distal left
brachiocephalic vein and probable tumor occlusion of the right
subclavian and jugular veins. Suspected tumor invasion of the upper
SVC with string like narrowing of the SVC, but with patency of the
SVC just above the right atrium. Tumor abuts the aorta and great
vessels and also results in significant narrowing of right upper
lobe pulmonary arterial vessels. Incompletely visualized right
supraclavicular adenopathy. Right hilar nodes and or mass.
3. At least 2 foci of somewhat nodular appearing airspace disease in
the right upper lobe, either representing pneumonia or foci of
neoplasm.
4. Emphysema

Emphysema ([8S]-[8S]).

ADDENDUM:
Clarification to the report. Thrombosis of the right jugular and
brachiocephalic veins and probable subclavian vein. Confluence of
these vessels is surrounded/occluded by the large mass. There is
suspected tumor occlusion/thrombosis of the distal left
brachiocephalic vein and upper SVC.

*** End of Addendum ***
FINDINGS: Cardiovascular: Satisfactory opacification of the pulmonary arteries
to the segmental level. No evidence of pulmonary embolism.
Nonaneurysmal aorta. No dissection. Normal heart size. No
significant pericardial effusion.

Occluded right jugular and probable subclavian veins. Narrowed
appearance of the left brachiocephalic vein as it approaches the
venous confluence. Narrowed and likely subtotal occlusion of the
upper SVC. Patency of the SVC as it enters the right atrium. Severe
narrowing of right upper lobe pulmonary arterial vessels. Numerous
collateral vessels within the mediastinum.

Mediastinum/Nodes: Midline trachea.  No discrete thyroid nodule.

Bulky malignant-appearing anterior and middle mediastinal mass,
infiltrative and poorly defined. Mass measures approximately 6.2 cm
AP by 5.6 cm transverse by 7.9 cm craniocaudad. It is contiguous
with additional mass or nodes in the right hilar region. The mass
encases/narrows the right upper lobe pulmonary vessels as well as
the superior vena cava. Probable tumor invasion of the distal left
brachiocephalic vessel and presumed tumor occlusion of the right
brachiocephalic and jugular vessels. Partially visualized enlarged
right supraclavicular nodes. Generalized edema within the soft
tissues of the thoracic inlet and base of right neck. Fluid-filled
esophagus with mild distal circumferential esophageal thickening.

Lungs/Pleura: Emphysema. Presumed left greater than right apical
scarring with left apical calcification. Posterior right upper lobe
subpleural nodular focus of airspace disease measuring 13 mm, series
6, image number 55. Irregular focus of airspace disease, also within
the right upper lobe abutting the fissure, series 6, image number
79. No pleural effusion or pneumothorax.

Upper Abdomen: No acute abnormality.

Musculoskeletal: No chest wall abnormality. No acute or significant
osseous findings.

Review of the MIP images confirms the above findings.
IMPRESSION: 1. No acute pulmonary embolus or aortic dissection.
2. Large poorly defined malignant-appearing infiltrative mass
measuring approximately 6.2 x 5.6 x 7.9 cm within the right anterior
and middle mediastinum. Suspected invasion of distal left
brachiocephalic vein and probable tumor occlusion of the right
subclavian and jugular veins. Suspected tumor invasion of the upper
SVC with string like narrowing of the SVC, but with patency of the
SVC just above the right atrium. Tumor abuts the aorta and great
vessels and also results in significant narrowing of right upper
lobe pulmonary arterial vessels. Incompletely visualized right
supraclavicular adenopathy. Right hilar nodes and or mass.
3. At least 2 foci of somewhat nodular appearing airspace disease in
the right upper lobe, either representing pneumonia or foci of
neoplasm.
4. Emphysema

Emphysema ([8S]-[8S]).

## 2019-01-20 IMAGING — US US THYROID
2 series · 14 of 25 positions shown · non-contrast
Comparison: None.

CLINICAL DATA: Goiter, neck swelling x3 months

EXAM:
THYROID ULTRASOUND
TECHNIQUE: Ultrasound examination of the thyroid gland and adjacent soft
tissues was performed.

[Series 1: us thyroid · 63 acquisitions, 7 frames shown (1 of 2)]
[im 1/63]
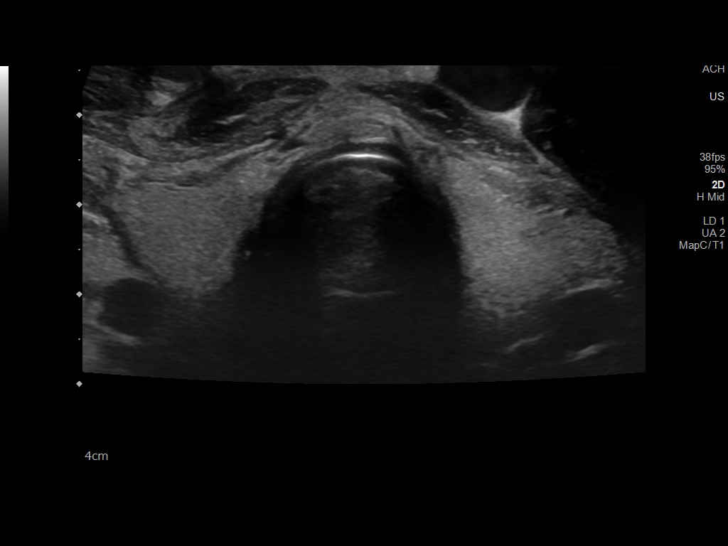
[im 12/63]
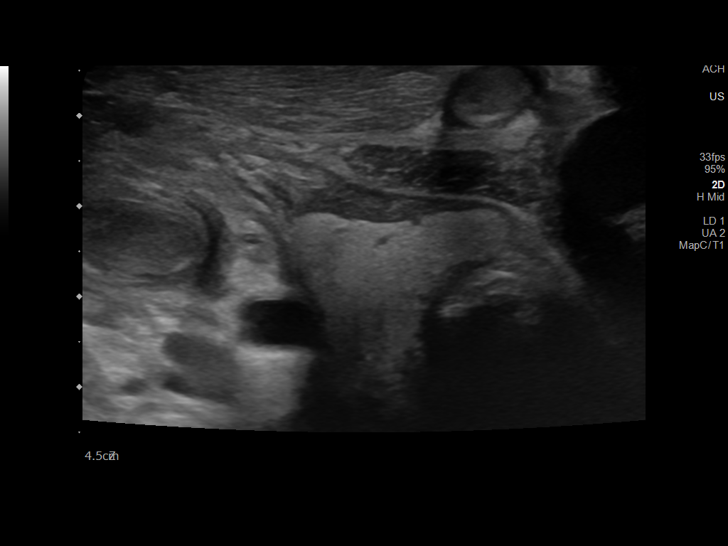
[im 23/63]
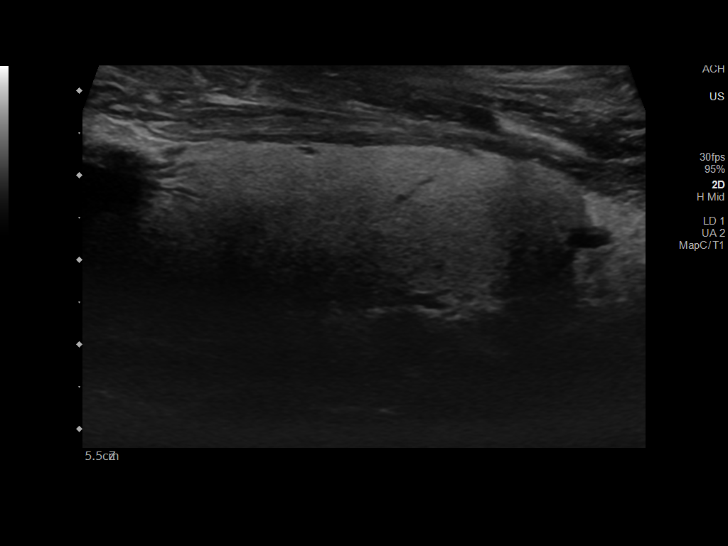
[im 34/63]
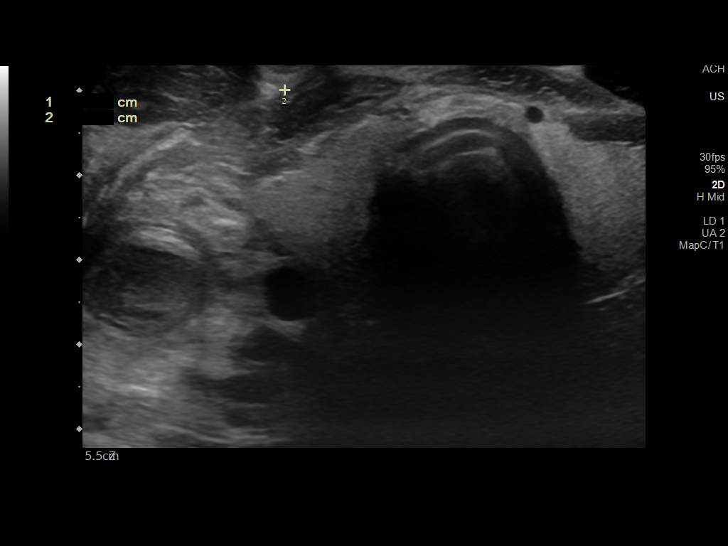
[im 46/63]
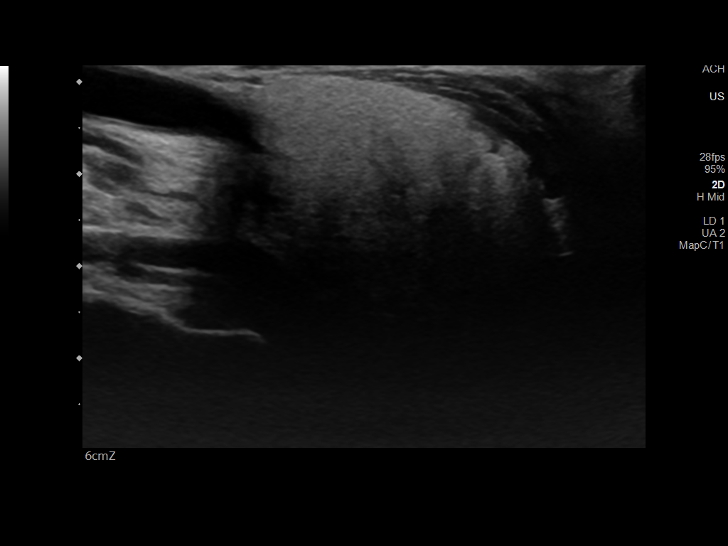
[im 51/63]
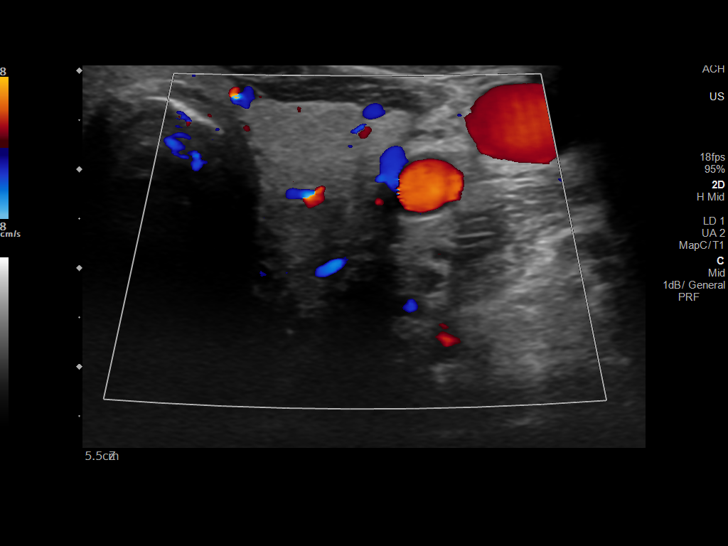
[im 63/63]
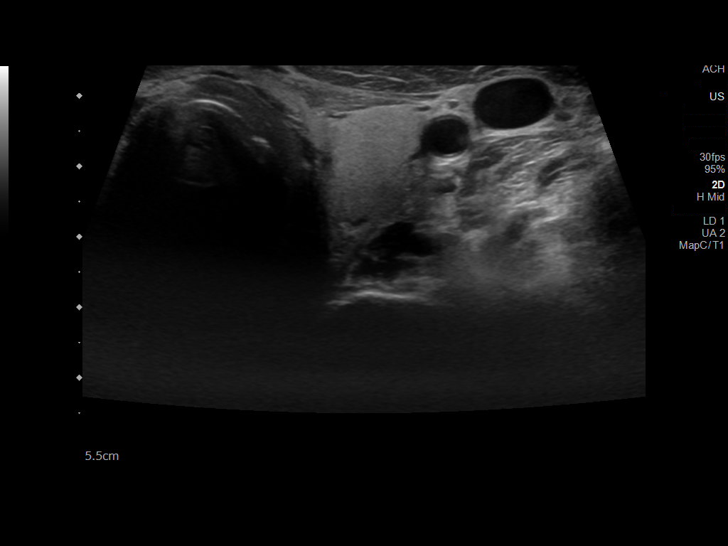

[Series 1: us thyroid · 65 acquisitions, 7 frames shown (2 of 2)]
[im 6/65]
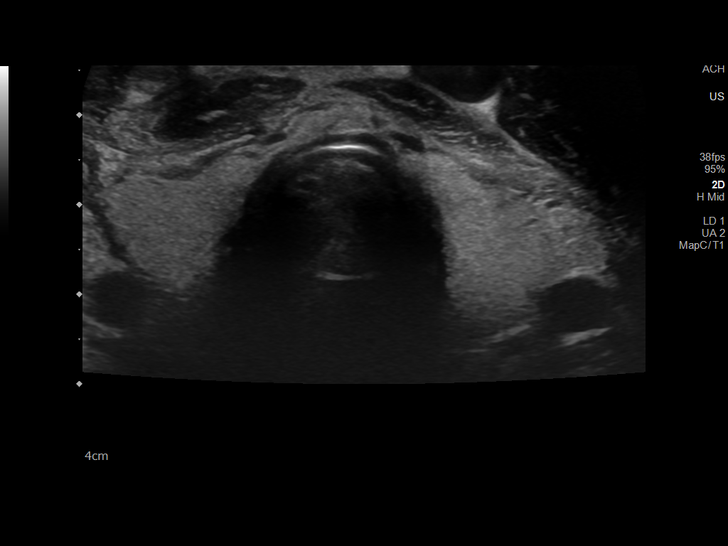
[im 17/65]
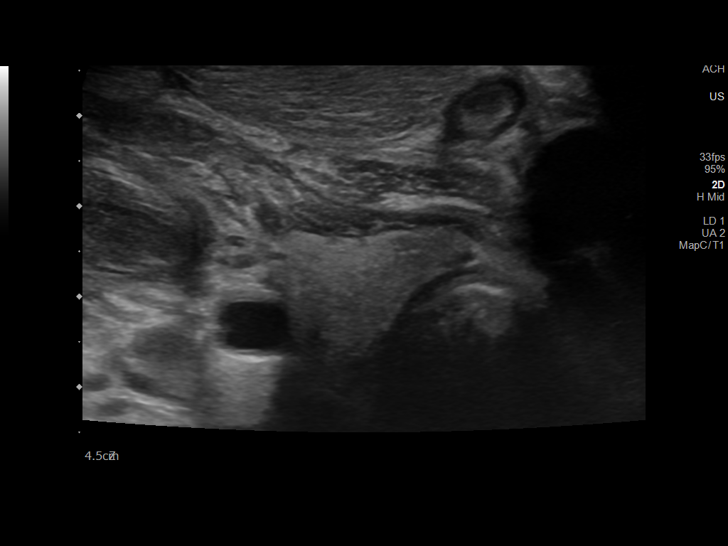
[im 22/65]
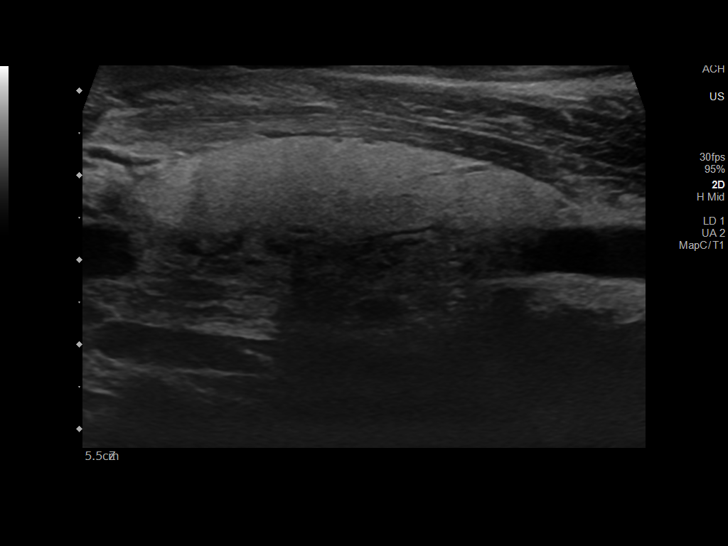
[im 33/65]
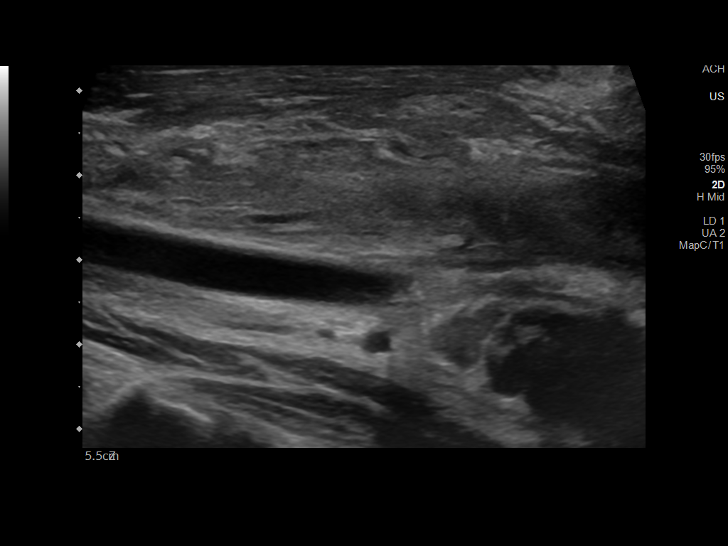
[im 43/65]
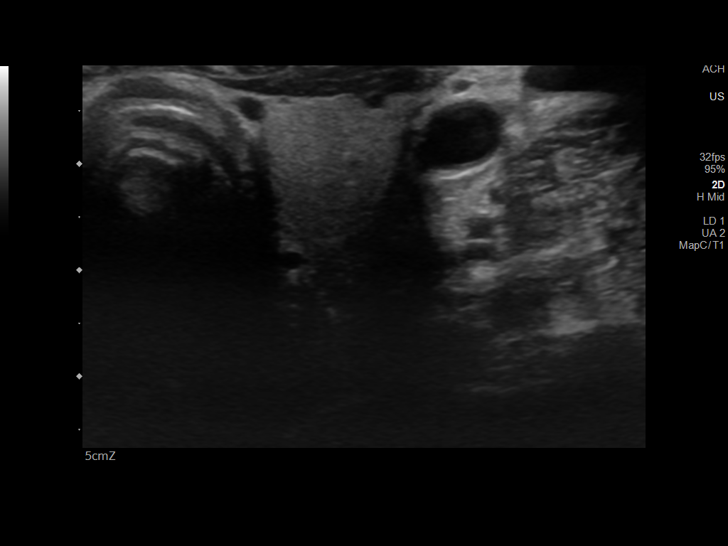
[im 54/65]
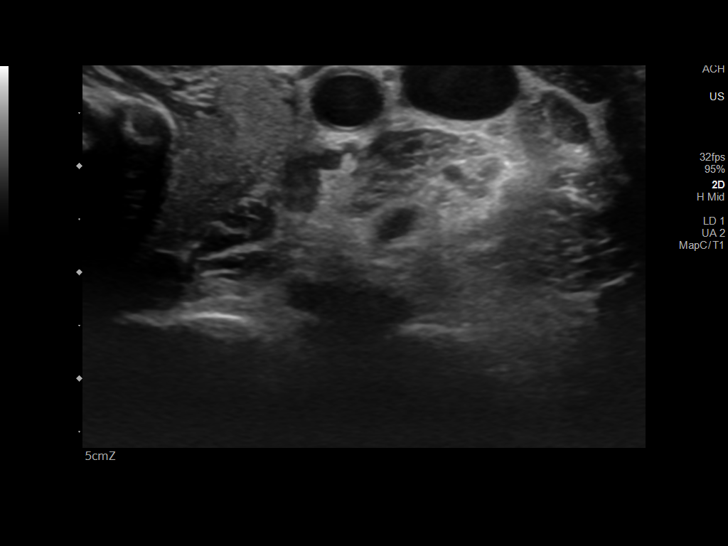
[im 65/65]
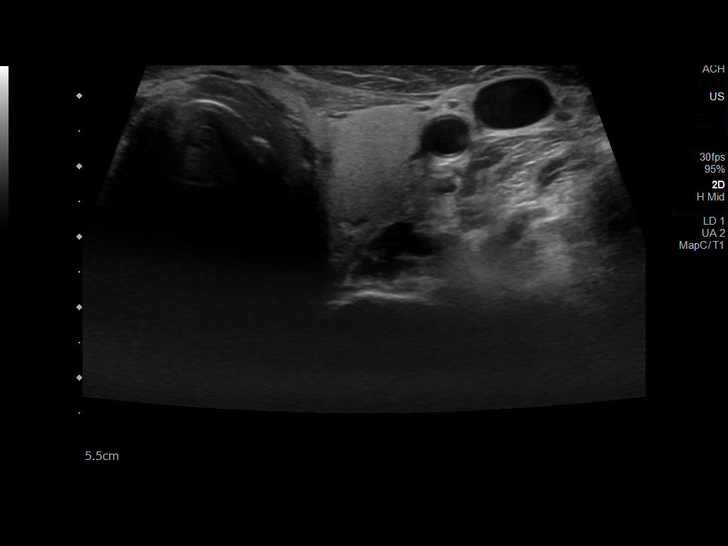

[14 of 25 positions shown; findings below may reference images not displayed]

FINDINGS: Parenchymal Echotexture: Mildly heterogenous

Isthmus: 0.3 cm thickness

Right lobe: 6 x 2.1 x 2.3 cm

Left lobe: 6 x 2 x 1.8 cm

_________________________________________________________

Estimated total number of nodules >/= 1 cm: 0

Number of spongiform nodules >/=  2 cm not described below (TR1): 0

Number of mixed cystic and solid nodules >/= 1.5 cm not described
below (TR2): 0

_________________________________________________________

No discrete nodules are seen within the thyroid gland.

Suspected echogenic thrombus occluding the right IJ vein.
IMPRESSION: 1. Thyromegaly without nodule.
2. Suspected right IJ vein occlusive thrombosis.

The above is in keeping with the ACR TI-RADS recommendations - [HOSPITAL] [N4];[DATE].

## 2019-01-20 MED ORDER — FENTANYL CITRATE (PF) 100 MCG/2ML IJ SOLN: 100.0000 ug | Freq: Once | INTRAMUSCULAR | Status: DC

## 2019-01-20 MED ORDER — TRAMADOL HCL 50 MG PO TABS
50.0000 mg | ORAL_TABLET | Freq: Four times a day (QID) | ORAL | Status: DC | PRN
  Filled 2019-01-20: qty 1

## 2019-01-20 MED ORDER — FENTANYL CITRATE (PF) 100 MCG/2ML IJ SOLN: 50.0000 ug | INTRAMUSCULAR | Status: DC | PRN

## 2019-01-20 MED ORDER — IOHEXOL 350 MG/ML SOLN
100.0000 mL | Freq: Once | INTRAVENOUS | Status: AC | PRN
  Administered 2019-01-20: 100 mL via INTRAVENOUS

## 2019-01-20 MED ORDER — HEPARIN BOLUS VIA INFUSION
2600.0000 [IU] | Freq: Once | INTRAVENOUS | Status: DC
  Filled 2019-01-20: qty 2600

## 2019-01-20 MED ORDER — ACETAMINOPHEN 325 MG PO TABS: 650.0000 mg | ORAL_TABLET | Freq: Four times a day (QID) | ORAL | Status: DC | PRN

## 2019-01-20 MED ORDER — HEPARIN (PORCINE) 25000 UT/250ML-% IV SOLN
1150.0000 [IU]/h | INTRAVENOUS | Status: DC
  Administered 2019-01-20: 850 [IU]/h via INTRAVENOUS
  Administered 2019-01-22: 1150 [IU]/h via INTRAVENOUS
  Filled 2019-01-20 (×3): qty 250

## 2019-01-20 MED ORDER — ACETAMINOPHEN 650 MG RE SUPP: 650.0000 mg | Freq: Four times a day (QID) | RECTAL | Status: DC | PRN

## 2019-01-20 NOTE — ED Triage Notes (Signed)
Pt. Stated, David Berry had a pinched nerve in hand  And I went to Dr. Wilburn Mylar and I had a xray of neck yesterday and showed nothing. My my neck is still swollen and I think its affecting my vision.

## 2019-01-20 NOTE — Progress Notes (Signed)
This patient has received 20mll's of IV Omnipaque 350 (type of contrast) contrast extravasation into left antecubital       The exam was performed on (date09/04/2019  Site / affected area assessed by Dr. Melanee Spry

## 2019-01-20 NOTE — Progress Notes (Signed)
ANTICOAGULATION CONSULT NOTE - Initial Consult  Pharmacy Consult for heparin Indication: right IJ thrombosis  Allergies  Allergen Reactions  . Pregabalin Other (See Comments)    Homicidal idealizations, pain in lower extremities      Patient Measurements: Height: 5\' 11"  (180.3 cm) Weight: 115 lb (52.2 kg) IBW/kg (Calculated) : 75.3 Heparin Dosing Weight: 52.2 kg  Vital Signs: Temp: 98.5 F (36.9 C) (09/12 0952) BP: 96/61 (09/12 0952) Pulse Rate: 104 (09/12 0952)  Labs: Recent Labs    01/20/19 1157 01/20/19 1357  HGB 12.0*  --   HCT 37.4*  --   PLT 382  --   APTT  --  30  LABPROT  --  14.8  INR  --  1.2  CREATININE 0.71  --     Estimated Creatinine Clearance: 79.8 mL/min (by C-G formula based on SCr of 0.71 mg/dL).   Medical History: History reviewed. No pertinent past medical history.   Assessment: 76 yom found to have infiltrative mass within R anterior/middle mediastinum with invasion into R subclavian/jugular veins - discussed among teams and Radiology, concern for thrombosis. No AC PTA.   Hgb 12, plt 382. No s/sx of bleeding.   Goal of Therapy:  Heparin level 0.3-0.7 units/ml Monitor platelets by anticoagulation protocol: Yes   Plan: Give 2600 units bolus x 1 Start heparin infusion at 850 units/hr Check anti-Xa level in 6 hours and daily while on heparin Continue to monitor H&H and platelets  Antonietta Jewel, PharmD, BCCCP Clinical Pharmacist  Phone: [phone]  Please check AMION for all [facility] phone numbers After 10:00 PM, call [place] [phone] 01/20/2019,8:12 PM

## 2019-01-20 NOTE — H&P (Signed)
History and Physical    David Berry:[id number] DOB: 06-16-1965 DOA: 01/20/2019  PCP: David Berry, No Pcp Per  David Berry coming from: Home  I have personally briefly reviewed David Berry's old medical records in [place]  Chief Complaint: Progressive right arm, chest pain with new right sided neck swelling  HPI: David Berry is a 53 y.o. male with no known medical history who presented with concerns of progressive worsening right upper extremity pain with radiation to the right chest as well as new right-sided neck swelling and was found on CTA neck to have a bulky soft tissue mass in the right neck..  David Berry reports that back in June he started to see an orthopedic for a right hand injury on the job.  He was initially diagnosed with carpal tunnel and received injection in August.  However, he progressively began to notice radiating pain up and down his right arm that would also go to his right chest.  He was advised to take ibuprofen but then started to notice blotches on his anterior chest after taking the medication. Denies any puritus or pain to rash. A week ago he developed new right-sided neck swelling and finally decided to come into the emergency department today after he felt more constriction around his neck.  He denies any issues with dysphasia or trouble breathing.  He had an episode of nausea, vomiting yesterday.  Also endorsed dizziness since August worse with standing.  Denies any fever, night sweats.  He reports smoking since 1997 and has about a pack of cigars every 2 days.  Denies any illicit drug use.  Reports that father had cancer but believes it was in the colon.  ED Course: He was afebrile and initially hypotensive with BP of 96/61 with improvement after 104/74 without intervention.  He was hemodynamically stable on room air.  CBC showed no leukocytosis but had anemia of12 with unknown baseline.  BMP was otherwise unremarkable.  TSH within normal limits.  CTA neck  revealed a bulky soft tissue mass in the right neck continuing into the upper chest most compatible with extensive metastatic lymphadenopathy.  David Berry reportedly had IV access extravasation while receiving CT and reportedly only contained IV normal saline fluid and no contrast.   Review of Systems:  Constitutional: + Weight Change, No Fever ENT/Mouth: No sore throat, No Rhinorrhea Eyes: No Eye Pain, No Vision Changes Cardiovascular:+ Chest Pain, no SOB,  Respiratory: +Cough, No Sputum, No Wheezing, Dyspnea  Gastrointestinal: No Nausea, No Vomiting, No Diarrhea, No Constipation, No Pain Genitourinary: no Urinary Incontinence, No Urgency, No Flank Pain Musculoskeletal: No Arthralgias, No Myalgias Skin: No Skin Lesions, No Pruritus, Neuro: no Weakness, No Numbness,  No Loss of Consciousness, No Syncope Psych: No Anxiety/Panic, No Depression, decrease appetite Heme/Lymph: No Bruising, No Bleeding  History reviewed. No pertinent past medical history.  Past Surgical History:  Procedure Laterality Date   APPENDECTOMY     teenager   INGUINAL HERNIA REPAIR Right 2016, 2017   [facility], 2018 [facility] mesh     reports that he has been smoking cigars. He has never used smokeless tobacco. He reports current alcohol use. He reports that he does not use drugs.  Allergies  Allergen Reactions   Pregabalin Other (See Comments)    Homicidal idealizations, pain in lower extremities      No family history on file.  Family history reviewed and not pertinent.  Prior to Admission medications   Medication Sig Start Date End Date Taking?  Authorizing Provider  ibuprofen (ADVIL,MOTRIN) 800 MG tablet Take 1 tablet (800 mg total) by mouth 3 (three) times daily. 05/05/18   Raylene Everts, MD  lidocaine (XYLOCAINE) 2 % solution Use as directed 15 mLs in the mouth or throat every 3 (three) hours as needed for mouth pain. 07/09/18   McDonald, Mia A, PA-C  oxyCODONE-acetaminophen  (PERCOCET/ROXICET) 5-325 MG tablet Take 1 tablet by mouth every 8 (eight) hours as needed for severe pain. 07/09/18   McDonald, Laymond Purser, PA-C    Physical Exam: Vitals:   01/20/19 0952  BP: 96/61  Pulse: (!) 104  Resp: 17  Temp: 98.5 F (36.9 C)  SpO2: 100%    Constitutional: NAD, calm, comfortable, sitting up in chair Vitals:   01/20/19 0952  BP: 96/61  Pulse: (!) 104  Resp: 17  Temp: 98.5 F (36.9 C)  SpO2: 100%   Eyes: PERRL, lids and conjunctivae normal ENMT: Mucous membranes are moist. Posterior pharynx clear of any exudate or lesions.Normal dentition.  Neck: large supple mass to right anterior cervical region with tenderness to light palpation.  Respiratory: clear to auscultation bilaterally, no wheezing, no crackles. Normal respiratory effort. No accessory muscle use.  Cardiovascular: Regular rate and rhythm, no murmurs / rubs / gallops. No extremity edema. 2+ pedal pulses. No carotid bruits.  Abdomen: no tenderness, no masses palpated. No hepatosplenomegaly. Bowel sounds positive.  Musculoskeletal: no clubbing / cyanosis. No joint deformity upper and lower extremities. Good ROM, no contractures. Normal muscle tone. Large area of edema to the medial side of his left elbow- edema exceeded skin pen marker from initial edema from IV excavations.  Skin: white papules of discoloration scattered on anterior chest Neurologic: CN 2-12 grossly intact. Sensation intact, DTR normal. Strength 5/5 in all 4.  Psychiatric: Normal judgment and insight. Alert and oriented x 3. Normal mood.     Labs on Admission: I have personally reviewed following labs and imaging studies  CBC: Recent Labs  Lab 01/20/19 1157  WBC 9.0  NEUTROABS 6.0  HGB 12.0*  HCT 37.4*  MCV 85.0  PLT 361   Basic Metabolic Panel: Recent Labs  Lab 01/20/19 1157  NA 137  K 4.4  CL 103  CO2 21*  GLUCOSE 87  BUN 8  CREATININE 0.71  CALCIUM 10.0   GFR: CrCl cannot be calculated (Unknown ideal  weight.). Liver Function Tests: No results for input(s): AST, ALT, ALKPHOS, BILITOT, PROT, ALBUMIN in the last 168 hours. No results for input(s): LIPASE, AMYLASE in the last 168 hours. No results for input(s): AMMONIA in the last 168 hours. Coagulation Profile: Recent Labs  Lab 01/20/19 1357  INR 1.2   Cardiac Enzymes: No results for input(s): CKTOTAL, CKMB, CKMBINDEX, TROPONINI in the last 168 hours. BNP (last 3 results) No results for input(s): PROBNP in the last 8760 hours. HbA1C: No results for input(s): HGBA1C in the last 72 hours. CBG: No results for input(s): GLUCAP in the last 168 hours. Lipid Profile: No results for input(s): CHOL, HDL, LDLCALC, TRIG, CHOLHDL, LDLDIRECT in the last 72 hours. Thyroid Function Tests: Recent Labs    01/20/19 1157  TSH 2.020   Anemia Panel: No results for input(s): VITAMINB12, FOLATE, FERRITIN, TIBC, IRON, RETICCTPCT in the last 72 hours. Urine analysis: No results found for: COLORURINE, APPEARANCEUR, LABSPEC, Vaughn, GLUCOSEU, HGBUR, BILIRUBINUR, KETONESUR, PROTEINUR, UROBILINOGEN, NITRITE, LEUKOCYTESUR  Radiological Exams on Admission: Ct Angio Neck W And/or Wo Contrast  Result Date: 01/20/2019 CLINICAL DATA:  53 year old male with unexplained neck  swelling for 3 months. Evidence of right IJ occlusion on thyroid ultrasound earlier today. EXAM: CT ANGIOGRAPHY NECK TECHNIQUE: Multidetector CT imaging of the neck was performed using the standard protocol during bolus administration of intravenous contrast. Multiplanar CT image reconstructions and MIPs were obtained to evaluate the vascular anatomy. Carotid stenosis measurements (when applicable) are obtained utilizing NASCET criteria, using the distal internal carotid diameter as the denominator. CONTRAST:  126mL OMNIPAQUE IOHEXOL 350 MG/ML SOLN COMPARISON:  CTA chest reported separately. Thyroid ultrasound earlier today. FINDINGS: Skeleton: No acute or suspicious osseous lesion from the  skull base to the upper chest. Mucous retention cyst in the left maxillary sinus. Mild right mastoid effusion. Upper chest: Mediastinal mass/lymphadenopathy, reported separately today. Other neck: Bulky but indistinct soft tissue masses tracking cephalad in the right neck from the thoracic inlet. Involvement of the right level 4, right level 5, right level 3 and right level 2 nodal stations. Superimposed subcutaneous edema in the region. Estimated individual lymph nodes/soft tissue mass size up to 34 millimeters long axis, 23 millimeters short axis. The level 1 nodal station seems to be spared. The left neck appears relatively spared. Mild leftward mass effect on the trachea and pharynx. The thyroid and pharyngeal soft tissue contours remain within normal limits. The glottis is closed. The parapharyngeal spaces are normal. And superior retropharyngeal space there is edema/effusion in the lower retropharyngeal space. The sublingual, submandibular and parotid spaces appear spared. Grossly negative visible brain parenchyma. Visualized orbit soft tissues are within normal limits. Aortic arch: 3 vessel arch configuration.  No arch atherosclerosis. Right carotid system: Mass effect on the brachiocephalic artery and to a lesser extent the right carotid space from the chest and neck soft tissue masses. No associated arterial stenosis. Negative right carotid bifurcation and cervical right ICA. Visible right ICA siphon is normal. Left carotid system: Negative aside from mild left ICA tortuosity in the neck. Negative visible left ICA siphon. Vertebral arteries: Mild mass effect on the proximal right subclavian artery due to the chest and neck soft tissue masses. No right subclavian stenosis. Normal right vertebral artery origin. Patent and normal right vertebral artery to the vertebrobasilar junction. Negative visible basilar artery. Negative proximal left subclavian artery and left vertebral artery origin. Mildly non dominant  left vertebral artery is patent and negative to the vertebrobasilar junction. Other findings: Delayed venous phase images were also obtained. The right sigmoid sinus and right IJ bulb are patent at the skull base, but the right jugular vein is occluded beginning at the level of the right retromandibular vein, and continuing to the right subclavian/innominate vein confluence. The right innominate vein is occluded in the upper chest on series 19, image 113. The left innominate and left internal jugular vein remain patent. The left sigmoid and transverse sinus are patent. Review of the MIP images confirms the above findings IMPRESSION: 1. Bulky soft tissue masses in the right neck and continuing into the upper chest most compatible with extensive metastatic lymphadenopathy. Individual nodal masses up to 34 mm. 2. Associated thrombosis of the Right IJ, Right Innominate Vein, and likely also the SVC - See Chest CTA reported separately. 3. Mild retropharyngeal effusion likely related to #2. 4. No arterial abnormality in the neck. Electronically Signed   By: Genevie Ann M.D.   On: 01/20/2019 17:18   Ct Angio Chest Aorta W/cm &/or Wo/cm  Result Date: 01/20/2019 CLINICAL DATA:  Jugular vein thrombosis EXAM: CT ANGIOGRAPHY CHEST WITH CONTRAST TECHNIQUE: Multidetector CT imaging of the chest was  performed using the standard protocol during bolus administration of intravenous contrast. Multiplanar CT image reconstructions and MIPs were obtained to evaluate the vascular anatomy. CONTRAST:  142mL OMNIPAQUE IOHEXOL 350 MG/ML SOLN COMPARISON:  Thyroid ultrasound 01/20/2019 FINDINGS: Cardiovascular: Satisfactory opacification of the pulmonary arteries to the segmental level. No evidence of pulmonary embolism. Nonaneurysmal aorta. No dissection. Normal heart size. No significant pericardial effusion. Occluded right jugular and probable subclavian veins. Narrowed appearance of the left brachiocephalic vein as it approaches the venous  confluence. Narrowed and likely subtotal occlusion of the upper SVC. Patency of the SVC as it enters the right atrium. Severe narrowing of right upper lobe pulmonary arterial vessels. Numerous collateral vessels within the mediastinum. Mediastinum/Nodes: Midline trachea.  No discrete thyroid nodule. 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 Mass measures approximately 6.2 cm AP by 5.6 cm transverse by 7.9 cm craniocaudad. It is contiguous with additional mass or nodes in the right hilar region. The mass encases/narrows the right upper lobe pulmonary vessels as well as the superior vena cava. Probable tumor invasion of the distal left brachiocephalic vessel and presumed tumor occlusion of the right brachiocephalic and jugular vessels. Partially visualized enlarged right supraclavicular nodes. Generalized edema within the soft tissues of the thoracic inlet and base of right neck. Fluid-filled esophagus with mild distal circumferential esophageal thickening. Lungs/Pleura: Emphysema. Presumed left greater than right apical scarring with left apical calcification. Posterior right upper lobe subpleural nodular focus of airspace disease measuring 13 mm, series 6, image number 55. Irregular focus of airspace disease, also within the right upper lobe abutting the fissure, series 6, image number 79. No pleural effusion or pneumothorax. Upper Abdomen: No acute abnormality. Musculoskeletal: No chest wall abnormality. No acute or significant osseous findings. Review of the MIP images confirms the above findings. IMPRESSION: 1. No acute pulmonary embolus or aortic dissection. 2. Large poorly defined malignant-appearing infiltrative mass measuring approximately 6.2 x 5.6 x 7.9 cm within the right anterior and middle mediastinum. Suspected invasion of distal left brachiocephalic vein and probable tumor occlusion of the right subclavian and jugular veins. Suspected tumor  invasion of the upper SVC with string like narrowing of the SVC, but with patency of the SVC just above the right atrium. Tumor abuts the aorta and great vessels and also results in significant narrowing of right upper lobe pulmonary arterial vessels. Incompletely visualized right supraclavicular adenopathy. Right hilar nodes and or mass. 3. At least 2 foci of somewhat nodular appearing airspace disease in the right upper lobe, either representing pneumonia or foci of neoplasm. 4. Emphysema Emphysema (ICD10-J43.9). Electronically Signed   By: Donavan Foil M.D.   On: 01/20/2019 17:41   US Thyroid  Result Date: 01/20/2019 CLINICAL DATA:  Goiter, neck swelling x3 months EXAM: THYROID ULTRASOUND TECHNIQUE: Ultrasound examination of the thyroid gland and adjacent soft tissues was performed. COMPARISON:  None. FINDINGS: Parenchymal Echotexture: Mildly heterogenous Isthmus: 0.3 cm thickness Right lobe: 6 x 2.1 x 2.3 cm Left lobe: 6 x 2 x 1.8 cm _________________________________________________________ Estimated total number of nodules >/= 1 cm: 0 Number of spongiform nodules >/=  2 cm not described below (TR1): 0 Number of mixed cystic and solid nodules >/= 1.5 cm not described below (TR2): 0 _________________________________________________________ No discrete nodules are seen within the thyroid gland. Suspected echogenic thrombus occluding the right IJ vein. IMPRESSION: 1. Thyromegaly without nodule. 2. 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 The above is in keeping with the ACR TI-RADS recommendations - J Am Coll  Radiol [id number];[id number]. Electronically Signed   By: Lucrezia Europe M.D.   On: 01/20/2019 13:27    EKG: no EKG in chart.   Assessment/Plan  New right mediastinal mass -CTA neck revealed a bulky soft tissue mass in the right neck continuing into the upper chest most compatible with extensive metastatic lymphadenopathy.  Mass measures up to 34 mm. Mass encases mutliple vessels including right upper  lobe pulmonary vessels, superior vena cava and brachiocephalic vessel - CT surgery consulted and will evaluate in the morning - SLP to evaluate   Mild retropharyngeal effusion likely secondary to new right neck mass - no respiratory compromise at this time. 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 Francoise Ceo with radiologist - Start on IV heparin drip for anticoagulation  Left upper extremity edema secondary to IV infiltrate -Reportedly had 50 cc of IV normal saline fluid extravasation while receiving CT - Keep arm elevated with ice pack - monitor closely.  Consider ultrasound if there is any development of pain or skin discoloration  Anterior chest macular rash - discolorated macular on chest. No puritis or pain. Suspect could be due to decrease circulation from tumor.    DVT prophylaxis: Heparin gtt Code Status: Full  Family Communication: plan discussed with David Berry Disposition Plan: Home after 2 midnights with evaluation of new right mediastinal mass Consults called: CT surgery Admission status: Inpatient to [facility] T Adriaan Maltese DO [facility]   If 7PM-7AM, please contact night-coverage [email or web address] Password [facility]  01/20/2019, 7:52 PM

## 2019-01-20 NOTE — ED Notes (Signed)
Patient transported to CT 

## 2019-01-20 NOTE — ED Provider Notes (Signed)
[facility] EMERGENCY DEPARTMENT Provider Note   CSN: [id number] Arrival date & time: 01/20/19  1884     History   Chief Complaint Chief Complaint  Patient presents with   Neck Pain    HPI David Berry is a 53 y.o. male.     53yo male presents with complaint of right side/anterior neck pain.  Patient reports a pinched nerve in his right wrist in June, was seen by orthopedics and later had a steroid injection into his wrist in August.  Patient states after getting the injection in his right wrist, he developed swelling in his right hand with pain in his right arm, right shoulder, right side and anterior neck and into his right jaw area.  Also reports hypopigmented rash to his upper chest area that developed after the injection.  Patient went back to his orthopedist and was told there was nothing more to do.  Patient was seen yesterday with a second orthopedist, had an x-ray of his C-spine and an MRI C-spine has been ordered.  Patient reports swelling to his right anterior neck area onset 1 month ago with the pain following the wrist injection, swelling has been worse since September 7. No other complaints or concerns.      History reviewed. No pertinent past medical history.  Patient Active Problem List   Diagnosis Date Noted   BACK PAIN, LUMBAR 03/24/2010   LUMBAR RADICULOPATHY 03/24/2010    Past Surgical History:  Procedure Laterality Date   APPENDECTOMY     teenager   INGUINAL HERNIA REPAIR Right 2016, 2017   [place], 2018 [facility]-removed mesh        Home Medications    Prior to Admission medications   Medication Sig Start Date End Date Taking? Authorizing Provider  ibuprofen (ADVIL,MOTRIN) 800 MG tablet Take 1 tablet (800 mg total) by mouth 3 (three) times daily. 05/05/18   Raylene Everts, MD  lidocaine (XYLOCAINE) 2 % solution Use as directed 15 mLs in the mouth or throat every 3 (three) hours as needed for mouth pain.  07/09/18   McDonald, Mia A, PA-C  oxyCODONE-acetaminophen (PERCOCET/ROXICET) 5-325 MG tablet Take 1 tablet by mouth every 8 (eight) hours as needed for severe pain. 07/09/18   McDonald, Mia A, PA-C    Family History No family history on file.  Social History Social History   Tobacco Use   Smoking status: Current Every Day Smoker    Types: Cigars   Smokeless tobacco: Never Used   Tobacco comment: smokes 3 black and mild cigars per day  Substance Use Topics   Alcohol use: Yes   Drug use: No     Allergies   Pregabalin   Review of Systems Review of Systems  Constitutional: Negative for chills and fever.  HENT: Negative for dental problem and ear pain.   Respiratory: Negative for shortness of breath.   Cardiovascular: Negative for chest pain.  Gastrointestinal: Negative for nausea and vomiting.  Musculoskeletal: Positive for arthralgias and neck pain.  Skin: Positive for rash. Negative for wound.  Allergic/Immunologic: Negative for immunocompromised state.  Neurological: Negative for weakness and numbness.  All other systems reviewed and are negative.    Physical Exam Updated Vital Signs BP 96/61 (BP Location: Left Arm)    Pulse (!) 104    Temp 98.5 F (36.9 C)    Resp 17    SpO2 100%   Physical Exam Vitals signs and nursing note reviewed.  Constitutional:  General: He is not in acute distress.    Appearance: He is well-developed. He is not diaphoretic.  HENT:     Head: Normocephalic and atraumatic.  Neck:     Musculoskeletal: Muscular tenderness present.  Cardiovascular:     Rate and Rhythm: Regular rhythm. Tachycardia present.     Pulses: Normal pulses.     Heart sounds: Normal heart sounds.  Pulmonary:     Effort: Pulmonary effort is normal.     Breath sounds: Normal breath sounds.  Musculoskeletal:        General: Tenderness present. No signs of injury.     Right shoulder: He exhibits tenderness. He exhibits normal range of motion.        Arms:  Skin:    General: Skin is warm and dry.     Findings: Rash present.     Comments: Pigmented rash across upper chest area, consider tinea versicolor.  Neurological:     Mental Status: He is alert and oriented to person, place, and time.  Psychiatric:        Behavior: Behavior normal.    Media Information    Document Information  Photos    01/20/2019 11:40  Attached To:  Hospital Encounter on 01/20/19  Source Information  Roque Lias   [facility]     ED Treatments / Results  Labs (all labs ordered are listed, but only abnormal results are displayed) Labs Reviewed  BASIC METABOLIC PANEL - Abnormal; Notable for the following components:      Result Value   CO2 21 (*)    All other components within normal limits  CBC WITH DIFFERENTIAL/PLATELET - Abnormal; Notable for the following components:   Hemoglobin 12.0 (*)    HCT 37.4 (*)    Monocytes Absolute 1.2 (*)    All other components within normal limits  SARS CORONAVIRUS 2 (HOSPITAL ORDER, [place] [facility])  TSH  PROTIME-INR  APTT    EKG None  Radiology Ct Angio Neck W And/or Wo Contrast  Result Date: 01/20/2019 CLINICAL DATA:  53 year old male with unexplained neck swelling for 3 months. Evidence of right IJ occlusion on thyroid ultrasound earlier today. EXAM: CT ANGIOGRAPHY NECK TECHNIQUE: Multidetector CT imaging of the neck was performed using the standard protocol during bolus administration of intravenous contrast. Multiplanar CT image reconstructions and MIPs were obtained to evaluate the vascular anatomy. Carotid stenosis measurements (when applicable) are obtained utilizing NASCET criteria, using the distal internal carotid diameter as the denominator. CONTRAST:  156mL OMNIPAQUE IOHEXOL 350 MG/ML SOLN COMPARISON:  CTA chest reported separately. Thyroid ultrasound earlier today. FINDINGS: Skeleton: No acute or suspicious osseous lesion from the skull base to the upper  chest. Mucous retention cyst in the left maxillary sinus. Mild right mastoid effusion. Upper chest: Mediastinal mass/lymphadenopathy, reported separately today. Other neck: Bulky but indistinct soft tissue masses tracking cephalad in the right neck from the thoracic inlet. Involvement of the right level 4, right level 5, right level 3 and right level 2 nodal stations. Superimposed subcutaneous edema in the region. Estimated individual lymph nodes/soft tissue mass size up to 34 millimeters long axis, 23 millimeters short axis. The level 1 nodal station seems to be spared. The left neck appears relatively spared. Mild leftward mass effect on the trachea and pharynx. The thyroid and pharyngeal soft tissue contours remain within normal limits. The glottis is closed. The parapharyngeal spaces are normal. And superior retropharyngeal space there is edema/effusion in the lower retropharyngeal space.  The sublingual, submandibular and parotid spaces appear spared. Grossly negative visible brain parenchyma. Visualized orbit soft tissues are within normal limits. Aortic arch: 3 vessel arch configuration.  No arch atherosclerosis. Right carotid system: Mass effect on the brachiocephalic artery and to a lesser extent the right carotid space from the chest and neck soft tissue masses. No associated arterial stenosis. Negative right carotid bifurcation and cervical right ICA. Visible right ICA siphon is normal. Left carotid system: Negative aside from mild left ICA tortuosity in the neck. Negative visible left ICA siphon. Vertebral arteries: Mild mass effect on the proximal right subclavian artery due to the chest and neck soft tissue masses. No right subclavian stenosis. Normal right vertebral artery origin. Patent and normal right vertebral artery to the vertebrobasilar junction. Negative visible basilar artery. Negative proximal left subclavian artery and left vertebral artery origin. Mildly non dominant left vertebral artery  is patent and negative to the vertebrobasilar junction. Other findings: Delayed venous phase images were also obtained. The right sigmoid sinus and right IJ bulb are patent at the skull base, but the right jugular vein is occluded beginning at the level of the right retromandibular vein, and continuing to the right subclavian/innominate vein confluence. The right innominate vein is occluded in the upper chest on series 19, image 113. The left innominate and left internal jugular vein remain patent. The left sigmoid and transverse sinus are patent. Review of the MIP images confirms the above findings IMPRESSION: 1. Bulky soft tissue masses in the right neck and continuing into the upper chest most compatible with extensive metastatic lymphadenopathy. Individual nodal masses up to 34 mm. 2. Associated thrombosis of the Right IJ, Right Innominate Vein, and likely also the SVC - See Chest CTA reported separately. 3. Mild retropharyngeal effusion likely related to #2. 4. No arterial abnormality in the neck. Electronically Signed   By: Genevie Ann M.D.   On: 01/20/2019 17:18   Ct Angio Chest Aorta W/cm &/or Wo/cm  Result Date: 01/20/2019 CLINICAL DATA:  Jugular vein thrombosis EXAM: CT ANGIOGRAPHY CHEST WITH CONTRAST TECHNIQUE: Multidetector CT imaging of the chest was performed using the standard protocol during bolus administration of intravenous contrast. Multiplanar CT image reconstructions and MIPs were obtained to evaluate the vascular anatomy. CONTRAST:  154mL OMNIPAQUE IOHEXOL 350 MG/ML SOLN COMPARISON:  Thyroid ultrasound 01/20/2019 FINDINGS: Cardiovascular: Satisfactory opacification of the pulmonary arteries to the segmental level. No evidence of pulmonary embolism. Nonaneurysmal aorta. No dissection. Normal heart size. No significant pericardial effusion. Occluded right jugular and probable subclavian veins. Narrowed appearance of the left brachiocephalic vein as it approaches the venous confluence. Narrowed  and likely subtotal occlusion of the upper SVC. Patency of the SVC as it enters the right atrium. Severe narrowing of right upper lobe pulmonary arterial vessels. Numerous collateral vessels within the mediastinum. Mediastinum/Nodes: Midline trachea.  No discrete thyroid nodule. 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 Mass measures approximately 6.2 cm AP by 5.6 cm transverse by 7.9 cm craniocaudad. It is contiguous with additional mass or nodes in the right hilar region. The mass encases/narrows the right upper lobe pulmonary vessels as well as the superior vena cava. Probable tumor invasion of the distal left brachiocephalic vessel and presumed tumor occlusion of the right brachiocephalic and jugular vessels. Partially visualized enlarged right supraclavicular nodes. Generalized edema within the soft tissues of the thoracic inlet and base of right neck. Fluid-filled esophagus with mild distal circumferential esophageal thickening. Lungs/Pleura: Emphysema. Presumed left greater than right  apical scarring with left apical calcification. Posterior right upper lobe subpleural nodular focus of airspace disease measuring 13 mm, series 6, image number 55. Irregular focus of airspace disease, also within the right upper lobe abutting the fissure, series 6, image number 79. No pleural effusion or pneumothorax. Upper Abdomen: No acute abnormality. Musculoskeletal: No chest wall abnormality. No acute or significant osseous findings. Review of the MIP images confirms the above findings. IMPRESSION: 1. No acute pulmonary embolus or aortic dissection. 2. Large poorly defined malignant-appearing infiltrative mass measuring approximately 6.2 x 5.6 x 7.9 cm within the right anterior and middle mediastinum. Suspected invasion of distal left brachiocephalic vein and probable tumor occlusion of the right subclavian and jugular veins. Suspected tumor invasion of the upper SVC  with string like narrowing of the SVC, but with patency of the SVC just above the right atrium. Tumor abuts the aorta and great vessels and also results in significant narrowing of right upper lobe pulmonary arterial vessels. Incompletely visualized right supraclavicular adenopathy. Right hilar nodes and or mass. 3. At least 2 foci of somewhat nodular appearing airspace disease in the right upper lobe, either representing pneumonia or foci of neoplasm. 4. Emphysema Emphysema (ICD10-J43.9). Electronically Signed   By: Donavan Foil M.D.   On: 01/20/2019 17:41   US Thyroid  Result Date: 01/20/2019 CLINICAL DATA:  Goiter, neck swelling x3 months EXAM: THYROID ULTRASOUND TECHNIQUE: Ultrasound examination of the thyroid gland and adjacent soft tissues was performed. COMPARISON:  None. FINDINGS: Parenchymal Echotexture: Mildly heterogenous Isthmus: 0.3 cm thickness Right lobe: 6 x 2.1 x 2.3 cm Left lobe: 6 x 2 x 1.8 cm _________________________________________________________ Estimated total number of nodules >/= 1 cm: 0 Number of spongiform nodules >/=  2 cm not described below (TR1): 0 Number of mixed cystic and solid nodules >/= 1.5 cm not described below (TR2): 0 _________________________________________________________ No discrete nodules are seen within the thyroid gland. Suspected echogenic thrombus occluding the right IJ vein. IMPRESSION: 1. Thyromegaly without nodule. 2. 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 The above is in keeping with the ACR TI-RADS recommendations - J Am Coll Radiol 2017;14:587-595. Electronically Signed   By: Lucrezia Europe M.D.   On: 01/20/2019 13:27    Procedures Procedures (including critical care time)  Medications Ordered in ED Medications  iohexol (OMNIPAQUE) 350 MG/ML injection 100 mL (100 mLs Intravenous Contrast Given 01/20/19 1541)     Initial Impression / Assessment and Plan / ED Course  I have reviewed the triage vital signs and the nursing notes.  Pertinent  labs & imaging results that were available during my care of the patient were reviewed by me and considered in my medical decision making (see chart for details).  Clinical Course as of Jan 20 1920  Sat Jan 20, 7148  4266 53 year old male presents with complaint of pain on the right side of his neck extending into his shoulder and down his right arm with swelling to the right side of his neck and supraclavicular space.  Patient reports onset of symptoms after a steroid injection in his wrist 1 month ago.  Patient followed up with his orthopedic surgeon and was not satisfied with the answers given, sought a second orthopedic surgeon yesterday who ordered an MRI of the C-spine.  Patient is frustrated today, feels like no one else can see the swelling to the right side of his neck or understand the pain he is having.  On exam patient has swelling to the right anterior lateral neck and  supraclavicular space when compared to the left side.  Picture added to patient's chart.  Patient is diffusely tender, pain at a proportion to the right side of his neck and right shoulder area. With tenderness in the neck and felt to be enlarged thyroid, ultrasound of the thyroid was initially ordered which showed thrombus in the IJ which was followed with a CTA of the neck and chest.   [LM]  1852 CT neck and chest show a large mass in the chest.  Case discussed with Dr. Tamera Punt, ER attending who has reviewed CT reports and recommends consult with CT surgery.  Case discussed with Dr. Cyndia Bent on-call with CT surgery, recommends admission to hospitalist service, CT surgery to consult tomorrow.   [LM]  2841 While patient was in the emergency room, returned from CT studies and CT tech reports approximately 50 cc extravasation of saline into left forearm, does not suspect IV contrast involved.  Patient was seen by Dr. Camila Li in radiology at that time and per CT tech recommended consult with plastics.  Call to radiology, spoke with Dr.  [place] Crane, patient is going to be admitted to the hospital, agrees with plan for monitoring the area.  Call to speak with Dr. Camila Li directly, not available to take call at this time.  Plan is to elevate extremity, ice pack applied and will monitor.  Patient reports a similar experience a year or so ago when donating plasma, states his IV blew at that time, had swelling in the area that gradually resolved.   [LM]  [id number] Reviewed results with patient, patient is frustrated due to the duration of his ER work-up today.  Informed patient of the mass in his chest on CT and the importance of staying in the hospital for further work-up.  Patient is agreeable to stay in the hospital at this time.   [LM]  1921 Case discussed with hospitalist who will consult for admission.    [LM]    Clinical Course User Index [LM] Tacy Learn, PA-C       Final Clinical Impressions(s) / ED Diagnoses   Final diagnoses:  Goiter  Mass of thoracic structure    ED Discharge Orders    None       Roque Lias 01/20/19 Caleen Essex, MD 01/20/19 2006

## 2019-01-20 NOTE — ED Notes (Signed)
Patient transported to Ultrasound 

## 2019-01-20 NOTE — ED Triage Notes (Signed)
Pt. Stated, this has been going on for 3 months.

## 2019-01-20 NOTE — ED Notes (Signed)
Patient back from CT.

## 2019-01-21 ENCOUNTER — Other Ambulatory Visit: Payer: Self-pay

## 2019-01-21 ENCOUNTER — Inpatient Hospital Stay (HOSPITAL_COMMUNITY): Payer: Medicaid Other

## 2019-01-21 DIAGNOSIS — I8290 Acute embolism and thrombosis of unspecified vein: Secondary | ICD-10-CM | POA: Diagnosis present

## 2019-01-21 DIAGNOSIS — C383 Malignant neoplasm of mediastinum, part unspecified: Secondary | ICD-10-CM

## 2019-01-21 DIAGNOSIS — I361 Nonrheumatic tricuspid (valve) insufficiency: Secondary | ICD-10-CM

## 2019-01-21 DIAGNOSIS — R222 Localized swelling, mass and lump, trunk: Secondary | ICD-10-CM

## 2019-01-21 DIAGNOSIS — I340 Nonrheumatic mitral (valve) insufficiency: Secondary | ICD-10-CM

## 2019-01-21 DIAGNOSIS — T82898A Other specified complication of vascular prosthetic devices, implants and grafts, initial encounter: Secondary | ICD-10-CM

## 2019-01-21 DIAGNOSIS — R21 Rash and other nonspecific skin eruption: Secondary | ICD-10-CM

## 2019-01-21 LAB — CBC
HCT: 35.6 % — ABNORMAL LOW (ref 39.0–52.0)
Hemoglobin: 11.4 g/dL — ABNORMAL LOW (ref 13.0–17.0)
MCH: 26.9 pg (ref 26.0–34.0)
MCHC: 32 g/dL (ref 30.0–36.0)
MCV: 84 fL (ref 80.0–100.0)
Platelets: 509 10*3/uL — ABNORMAL HIGH (ref 150–400)
RBC: 4.24 MIL/uL (ref 4.22–5.81)
RDW: 14.7 % (ref 11.5–15.5)
WBC: 7.4 10*3/uL (ref 4.0–10.5)
nRBC: 0 % (ref 0.0–0.2)

## 2019-01-21 LAB — HEPARIN LEVEL (UNFRACTIONATED)
Heparin Unfractionated: <0.1 IU/mL — ABNORMAL LOW (ref 0.30–0.70)
Heparin Unfractionated: <0.1 IU/mL — ABNORMAL LOW (ref 0.30–0.70)
Heparin Unfractionated: <0.1 IU/mL — ABNORMAL LOW (ref 0.30–0.70)

## 2019-01-21 LAB — HIV ANTIBODY (ROUTINE TESTING W REFLEX): HIV Screen 4th Generation wRfx: NONREACTIVE

## 2019-01-21 IMAGING — CT CT ABD-PELV W/ CM
2 of 5 series · 15 of 46 positions shown, 17 images · IV contrast (APPLIED)
Comparison: None.

CLINICAL DATA: New diagnosis of mediastinal mass. Evaluate for
lymphoma or metastatic disease.

EXAM:
CT ABDOMEN AND PELVIS WITH CONTRAST
TECHNIQUE: Multidetector CT imaging of the abdomen and pelvis was performed
using the standard protocol following bolus administration of
intravenous contrast.
CONTRAST:  100mL OMNIPAQUE IOHEXOL 300 MG/ML  SOLN

[Series 3: abdomen 5.0 · axial · 0.62mm/px · z∈[+982,+1347]mm · 12 of 85 slices shown, 14 images]
[im 6/85  soft-tissue]
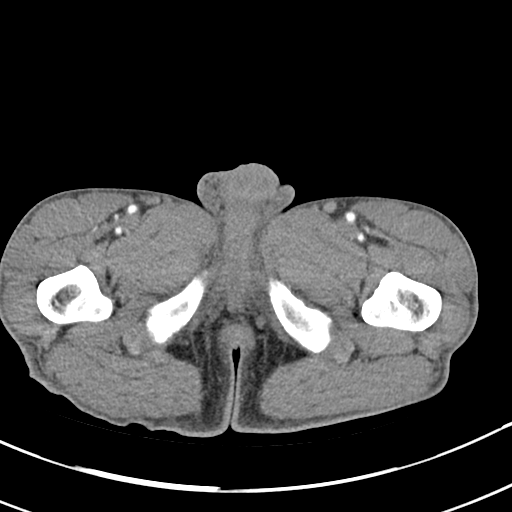
[im 6/85  bone]
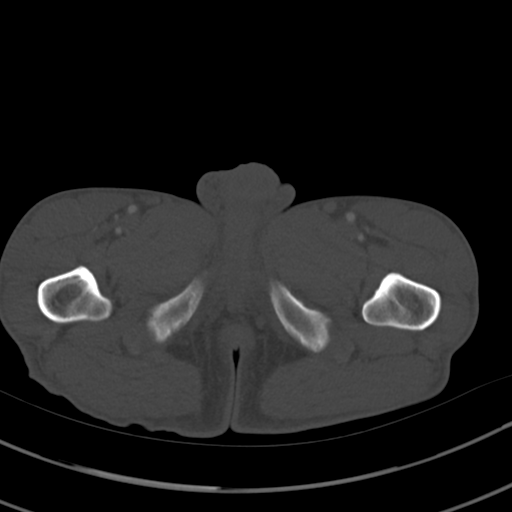
[im 11/85  soft-tissue]
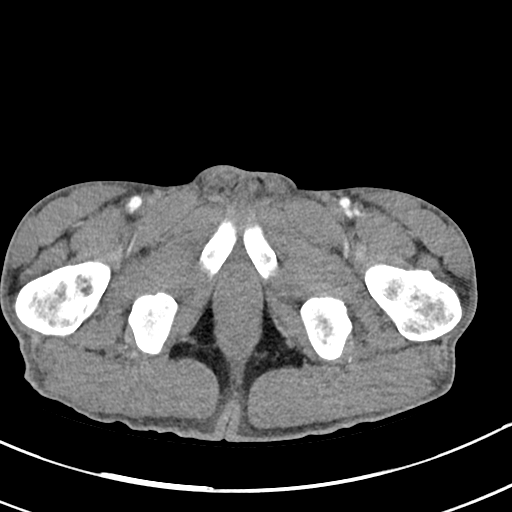
[im 22/85  soft-tissue]
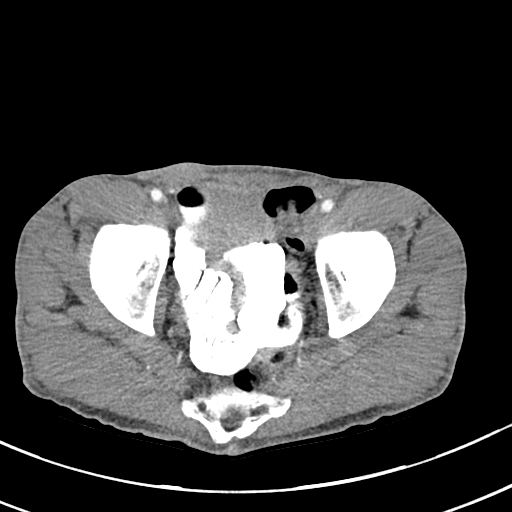
[im 27/85  soft-tissue]
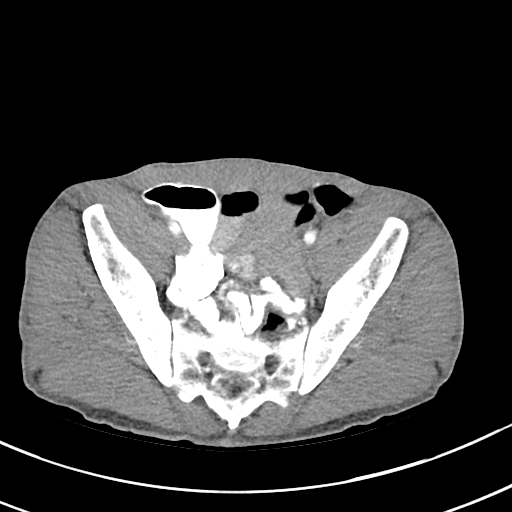
[im 32/85  soft-tissue]
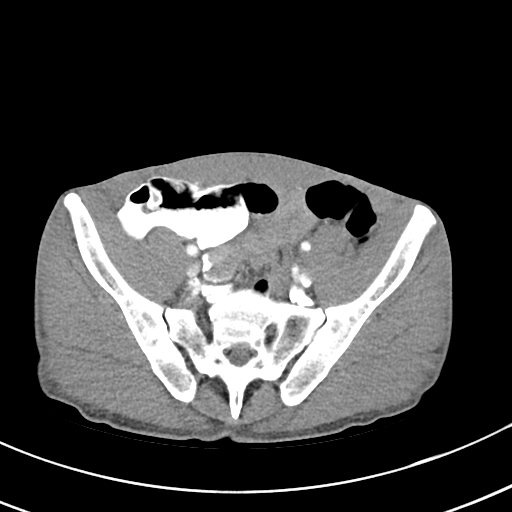
[im 37/85  soft-tissue]
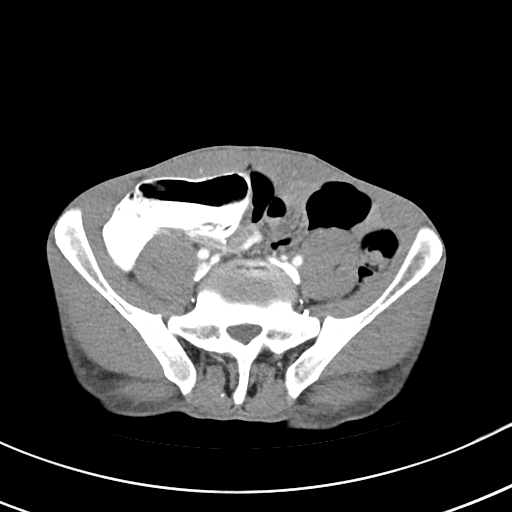
[im 48/85  soft-tissue]
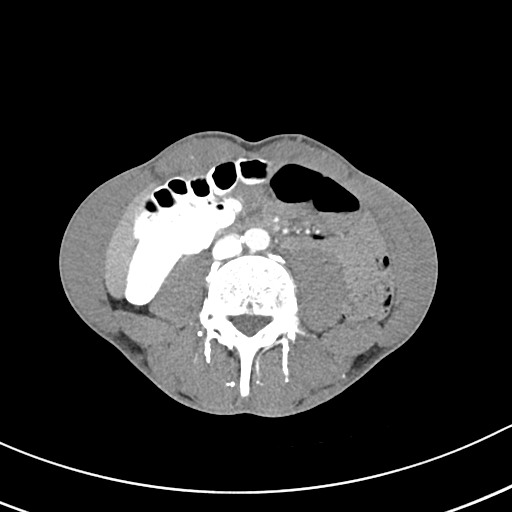
[im 53/85  soft-tissue]
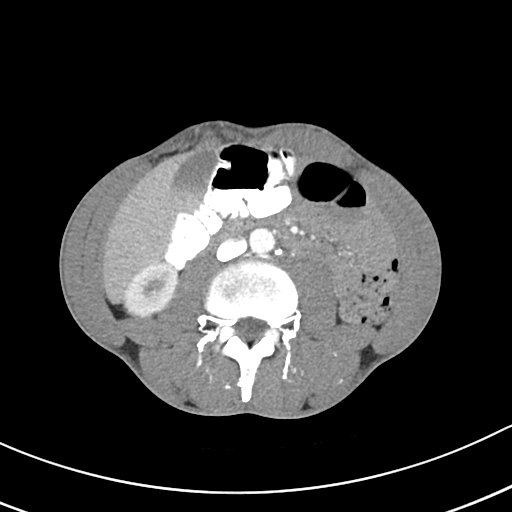
[im 58/85  soft-tissue]
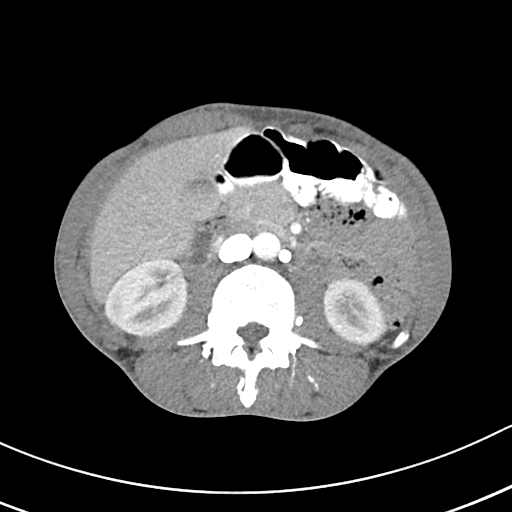
[im 58/85  bone]
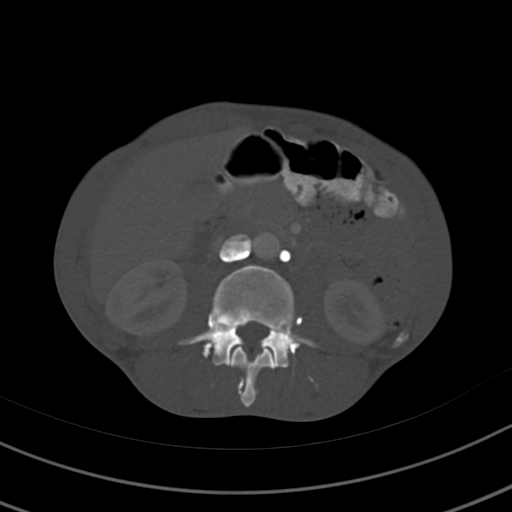
[im 64/85  soft-tissue]
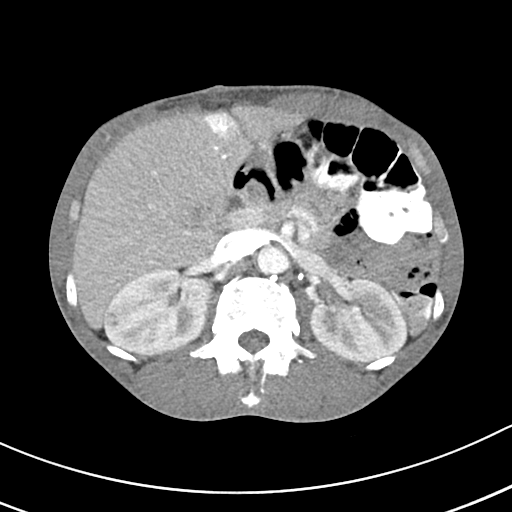
[im 74/85  soft-tissue]
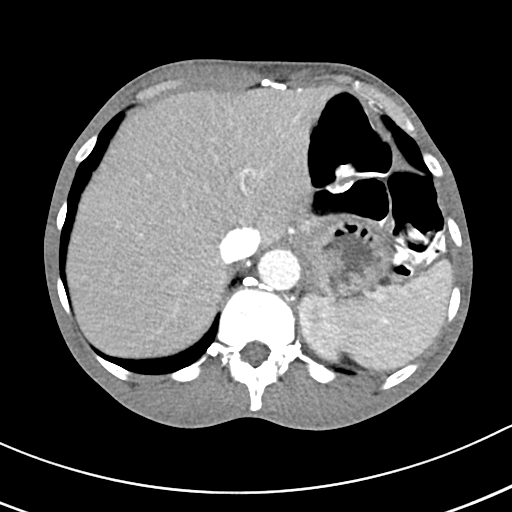
[im 79/85  soft-tissue]
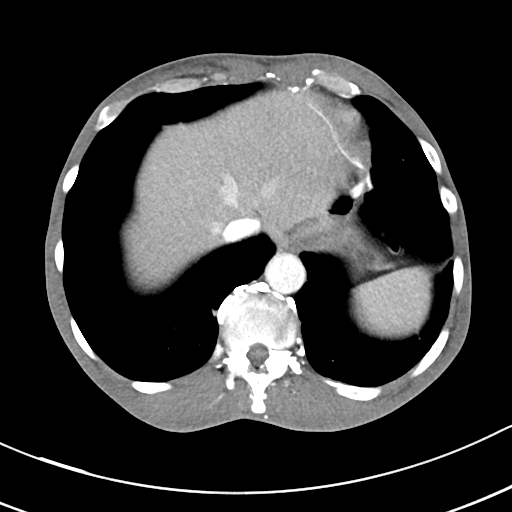

[Series 6: abdomen 3.0 mpr cor · coronal · 0.60mm/px · 3 of 86 slices shown]
[im 29/86  soft-tissue]
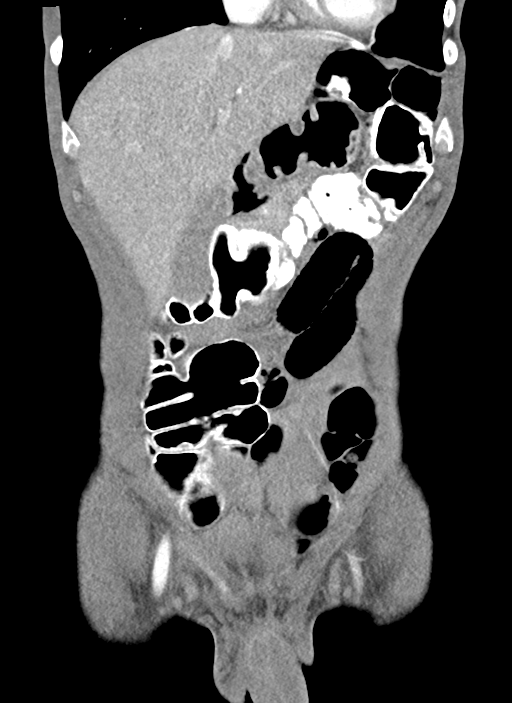
[im 38/86  soft-tissue]
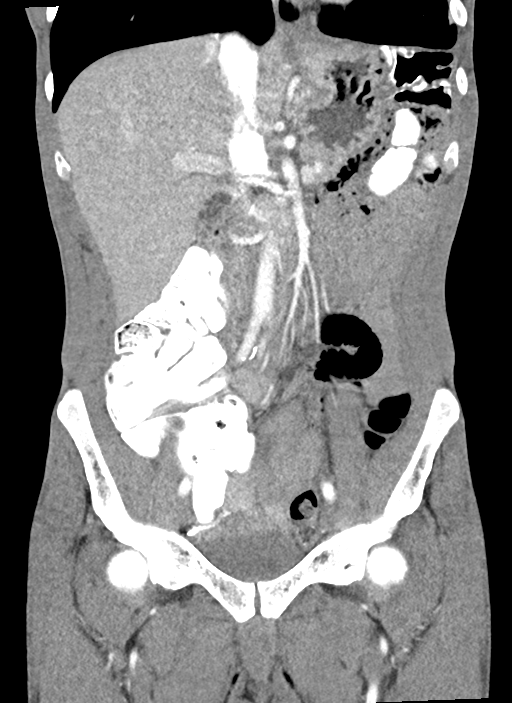
[im 48/86  soft-tissue]
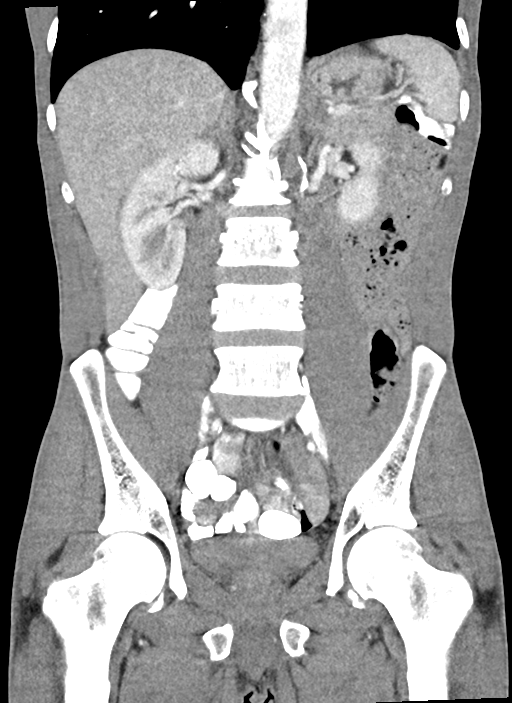

[15 of 46 positions shown; findings below may reference images not displayed]

FINDINGS: Lower chest: The unremarkable.

Hepatobiliary: Hyperenhancing focus in the anterior left liver
likely related to anomalous venous anatomy/flow. Liver otherwise
unremarkable. Probable layering sludge in the gallbladder lumen No
intrahepatic or extrahepatic biliary dilation.

Pancreas: No focal mass lesion. No dilatation of the main duct. No
intraparenchymal cyst. No peripancreatic edema.

Spleen: No splenomegaly. No focal mass lesion.

Adrenals/Urinary Tract: No adrenal nodule or mass. 2 mm
nonobstructing stone noted lower pole right kidney. 15 mm
subcapsular lesion in the upper pole right kidney has heterogeneous
enhancement. Left kidney unremarkable. Ureters not well visualized
due to lack of retroperitoneal fat but no hydroureter evident. The
urinary bladder appears normal for the degree of distention.

Stomach/Bowel: Stomach is unremarkable. No gastric wall thickening.
No evidence of outlet obstruction. No small bowel or colonic
dilatation.

Vascular/Lymphatic: No abdominal aortic aneurysm. No discernible
lymphadenopathy in the abdomen or pelvis.

Reproductive: The prostate gland and seminal vesicles are
unremarkable.

Other: No intraperitoneal free fluid.

Musculoskeletal: 11 mm sclerotic focus noted posterior left iliac
bone. 10 mm sclerotic focus noted in the posterior right acetabulum.
Increased attenuation in lower lumbar vertebral bodies likely
enhancement related to opacified venous anatomy.
IMPRESSION: 1. 15 mm heterogeneously enhancing lesion in the upper pole right
kidney. Renal cell carcinoma a concern. MRI without and with
contrast recommended to further evaluate.
2. No lymphadenopathy in the abdomen or pelvis.
3. Focal hyperenhancement in the anterior left liver most likely
related to aberrant venous anatomy/flow.
4. Sclerotic lesions in the right acetabulum and left iliac bone are
indeterminate. Likely bone islands but attention on follow-up
recommended as metastatic cannot be excluded.

## 2019-01-21 MED ORDER — SODIUM CHLORIDE 0.9 % IV SOLN
INTRAVENOUS | Status: DC
  Administered 2019-01-21 – 2019-01-27 (×3): via INTRAVENOUS

## 2019-01-21 MED ORDER — MORPHINE SULFATE (PF) 2 MG/ML IV SOLN
1.0000 mg | INTRAVENOUS | Status: DC | PRN
  Administered 2019-01-22 – 2019-01-27 (×2): 1 mg via INTRAVENOUS
  Filled 2019-01-21 (×2): qty 1

## 2019-01-21 MED ORDER — MORPHINE SULFATE (PF) 2 MG/ML IV SOLN
1.0000 mg | Freq: Once | INTRAVENOUS | Status: AC
  Administered 2019-01-21: 06:00:00 1 mg via INTRAVENOUS
  Filled 2019-01-21: qty 1

## 2019-01-21 MED ORDER — HEPARIN BOLUS VIA INFUSION
3000.0000 [IU] | Freq: Once | INTRAVENOUS | Status: AC
  Administered 2019-01-21: 3000 [IU] via INTRAVENOUS
  Filled 2019-01-21: qty 3000

## 2019-01-21 MED ORDER — HEPARIN BOLUS VIA INFUSION
2000.0000 [IU] | Freq: Once | INTRAVENOUS | Status: AC
  Administered 2019-01-22: 2000 [IU] via INTRAVENOUS
  Filled 2019-01-21: qty 2000

## 2019-01-21 MED ORDER — IOHEXOL 300 MG/ML  SOLN
100.0000 mL | Freq: Once | INTRAMUSCULAR | Status: AC | PRN
  Administered 2019-01-21: 100 mL via INTRAVENOUS

## 2019-01-21 MED ORDER — PIPERACILLIN-TAZOBACTAM 3.375 G IVPB
3.3750 g | Freq: Three times a day (TID) | INTRAVENOUS | Status: DC
  Administered 2019-01-21 – 2019-01-24 (×8): 3.375 g via INTRAVENOUS
  Filled 2019-01-21 (×7): qty 50

## 2019-01-21 NOTE — Progress Notes (Signed)
Pharmacy Antibiotic Note  David Berry is a 53 y.o. male admitted on 01/20/2019 with upper extremity neck pain and right sided neck mass and swell. Now being consulted for zosyn for empiric treatment of POST OBSTRUCTIVE PNEUMONIA ON CT CHEST. Pharmacy has been consulted for Zosyn dosing. Patient CrCl 79.27ml/min (scr 0.71), WBC 7.4.   Plan: Zosyn 3.375 grams IV q8h (Extended-infusion duration) Follow-up LOT, signs/symptoms of infection, clinical improvement, de-escalation.   Height: 5\' 11"  (180.3 cm) Weight: 114 lb 10.2 oz (52 kg) IBW/kg (Calculated) : 75.3  Temp (24hrs), Avg:98.2 F (36.8 C), Min:98.2 F (36.8 C), Max:98.2 F (36.8 C)  Recent Labs  Lab 01/20/19 1157 01/21/19 0251  WBC 9.0 7.4  CREATININE 0.71  --     Estimated Creatinine Clearance: 79.4 mL/min (by C-G formula based on SCr of 0.71 mg/dL).    Allergies  Allergen Reactions  . Pregabalin Other (See Comments)    Homicidal idealizations, pain in lower extremities      Antimicrobials this admission: Zosyn 9/13 >>    Dose adjustments this admission: N/a  Microbiology results: Covid-19: Negative No other cultures sent   Thank you for the interesting consult and for involving pharmacy in this patient's care.  Tamela Gammon, PharmD 01/21/2019 2:13 PM PGY-2 Pharmacy Administration Resident Direct Phone: [phone] Please check [email or web address] for unit-specific pharmacist phone numbers

## 2019-01-21 NOTE — Consult Note (Signed)
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 Dellia Beckwith [name] is an 53 y.o. male.  HPI:   The patient is a 53 year old gentleman with a history of smoking who developed some pain in the right upper extremity and was diagnosed with carpal tunnel syndrome.  He has had progressive development of pain radiating down his right arm into the right chest.  About 1 week ago he developed new right-sided neck swelling and pain radiating up into the back of his head.  He said that yesterday he developed some difficulty with swallowing and therefore came to the emergency room.  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  There was suspected invasion of the brachiocephalic vein and probable tumor occlusion of the right subclavian and jugular veins.  There was suspected tumor invasion of the SVC with string-like narrowing of the SVC.  There is also significant narrowing of the right upper lobe pulmonary artery vessels.  There appear to be a soft tissue mass in the right neck tracking cephalad from the thoracic inlet with involvement of multiple lymph node stations.  There is mild leftward mass-effect on the trachea and pharynx with some edema in the superior retropharyngeal space.  History reviewed. No pertinent past medical history.  Past Surgical History:  Procedure Laterality Date   APPENDECTOMY     teenager   INGUINAL HERNIA REPAIR Right 2016, 2017   [facility], 2018 [facility]    No family history on file.  Social History:  reports that he has been smoking cigars. He has never used smokeless tobacco. He reports current alcohol use. He reports that he does not use  drugs.  Allergies:  Allergies  Allergen Reactions   Pregabalin Other (See Comments)    Homicidal idealizations, pain in lower extremities      Medications:  I have reviewed the patient's current medications. Prior to Admission:  Medications Prior to Admission  Medication Sig Dispense Refill Last Dose   ibuprofen (ADVIL,MOTRIN) 800 MG tablet Take 1 tablet (800 mg total) by mouth 3 (three) times daily. 21 tablet 0    lidocaine (XYLOCAINE) 2 % solution Use as directed 15 mLs in the mouth or throat every 3 (three) hours as needed for mouth pain. 100 mL 0    oxyCODONE-acetaminophen (PERCOCET/ROXICET) 5-325 MG tablet Take 1 tablet by mouth every 8 (eight) hours as needed for severe pain. 9 tablet 0    Scheduled:  heparin  2,600 Units Intravenous Once   Continuous:  heparin 850 Units/hr (01/20/19 2232)   piperacillin-tazobactam (ZOSYN)  IV     [id number] **OR** acetaminophen, morphine injection, traMADol Anti-infectives (From admission, onward)   Start     Dose/Rate Route Frequency Ordered Stop   01/21/19 1430  piperacillin-tazobactam (ZOSYN) IVPB 3.375 g     3.375 g 12.5 mL/hr over 240 Minutes Intravenous Every 8 hours 01/21/19 1415        Results for orders placed or performed during the hospital encounter of 01/20/19 (from the past 48 hour(s))  Basic metabolic panel     Status: Abnormal   Collection Time: 01/20/19 11:57 AM  Result  Value Ref Range   Sodium 137 135 - 145 mmol/L   Potassium 4.4 3.5 - 5.1 mmol/L   Chloride 103 98 - 111 mmol/L   CO2 21 (L) 22 - 32 mmol/L   Glucose, Bld 87 70 - 99 mg/dL   BUN 8 6 - 20 mg/dL   Creatinine, Ser 0.71 0.61 - 1.24 mg/dL   Calcium 10.0 8.9 - 10.3 mg/dL   GFR calc non Af Amer >60 >60 mL/min   GFR calc Af Amer >60 >60 mL/min   Anion gap 13 5 - 15    Comment: Performed at [facility] [address], [place], [place] [address]  CBC with Differential     Status: Abnormal   Collection Time: 01/20/19 11:57 AM  Result  Value Ref Range   WBC 9.0 4.0 - 10.5 K/uL   RBC 4.40 4.22 - 5.81 MIL/uL   Hemoglobin 12.0 (L) 13.0 - 17.0 g/dL   HCT 37.4 (L) 39.0 - 52.0 %   MCV 85.0 80.0 - 100.0 fL   MCH 27.3 26.0 - 34.0 pg   MCHC 32.1 30.0 - 36.0 g/dL   RDW 14.7 11.5 - 15.5 %   Platelets 382 150 - 400 K/uL   nRBC 0.0 0.0 - 0.2 %   Neutrophils Relative % 66 %   Neutro Abs 6.0 1.7 - 7.7 K/uL   Lymphocytes Relative 19 %   Lymphs Abs 1.7 0.7 - 4.0 K/uL   Monocytes Relative 13 %   Monocytes Absolute 1.2 (H) 0.1 - 1.0 K/uL   Eosinophils Relative 1 %   Eosinophils Absolute 0.1 0.0 - 0.5 K/uL   Basophils Relative 1 %   Basophils Absolute 0.1 0.0 - 0.1 K/uL   Immature Granulocytes 0 %   Abs Immature Granulocytes 0.03 0.00 - 0.07 K/uL    Comment: Performed at [facility] [address], [place], [place] [address]  TSH     Status: None   Collection Time: 01/20/19 11:57 AM  Result Value Ref Range   TSH 2.020 0.350 - 4.500 uIU/mL    Comment: Performed by a 3rd Generation assay with a functional sensitivity of <=0.01 uIU/mL. Performed at [facility], [place] [address] [place], [place] [address]   Protime-INR     Status: None   Collection Time: 01/20/19  1:57 PM  Result Value Ref Range   Prothrombin Time 14.8 11.4 - 15.2 seconds   INR 1.2 0.8 - 1.2    Comment: (NOTE) INR goal varies based on device and disease states. Performed at [facility], [place] [address], [place], [place] [address]   APTT     Status: None   Collection Time: 01/20/19  1:57 PM  Result Value Ref Range   aPTT 30 24 - 36 seconds    Comment: Performed at [facility] [address] [place],  [address]  SARS Coronavirus 2 [facility] order, Performed in [facility] hospital lab) Nasopharyngeal Nasopharyngeal Swab     Status: None   Collection Time: 01/20/19  6:30 PM   Specimen: Nasopharyngeal Swab  Result Value Ref Range   SARS Coronavirus 2 NEGATIVE NEGATIVE    Comment: (NOTE) If result is NEGATIVE SARS-CoV-2  target nucleic acids are NOT DETECTED. The SARS-CoV-2 RNA is generally detectable in upper and lower  respiratory specimens during the acute phase of infection. The lowest  concentration of SARS-CoV-2 viral copies this assay can detect is 250  copies / mL. A  negative result does not preclude SARS-CoV-2 infection  and should not be used as the sole basis for treatment or other  patient management decisions.  A negative result may occur with  improper specimen collection / handling, submission of specimen other  than nasopharyngeal swab, presence of viral mutation(s) within the  areas targeted by this assay, and inadequate number of viral copies  (<250 copies / mL). A negative result must be combined with clinical  observations, patient history, and epidemiological information. If result is POSITIVE SARS-CoV-2 target nucleic acids are DETECTED. The SARS-CoV-2 RNA is generally detectable in upper and lower  respiratory specimens dur ing the acute phase of infection.  Positive  results are indicative of active infection with SARS-CoV-2.  Clinical  correlation with patient history and other diagnostic information is  necessary to determine patient infection status.  Positive results do  not rule out bacterial infection or co-infection with other viruses. If result is PRESUMPTIVE POSTIVE SARS-CoV-2 nucleic acids MAY BE PRESENT.   A presumptive positive result was obtained on the submitted specimen  and confirmed on repeat testing.  While 2019 novel coronavirus  (SARS-CoV-2) nucleic acids may be present in the submitted sample  additional confirmatory testing may be necessary for epidemiological  and / or clinical management purposes  to differentiate between  SARS-CoV-2 and other Sarbecovirus currently known to infect humans.  If clinically indicated additional testing with an alternate test  methodology [phone]) is advised. The SARS-CoV-2 RNA is generally  detectable in upper and lower  respiratory sp ecimens during the acute  phase of infection. The expected result is Negative. Fact Sheet for Patients:  StrictlyIdeas.no Fact Sheet for Healthcare Providers: BankingDealers.co.za This test is not yet approved or cleared by the [place] FDA and has been authorized for detection and/or diagnosis of SARS-CoV-2 by FDA under an Emergency Use Authorization (EUA).  This EUA will remain in effect (meaning this test can be used) for the duration of the COVID-19 declaration under Section 564(b)(1) of the Act, 21 U.S.C. section 360bbb-3(b)(1), unless the authorization is terminated or revoked sooner. Performed at [facility], [place] [address] [place], [place] [address]   Heparin level (unfractionated)     Status: Abnormal   Collection Time: 01/21/19  2:51 AM  Result Value Ref Range   Heparin Unfractionated <0.10 (L) 0.30 - 0.70 IU/mL    Comment: (NOTE) If heparin results are below expected values, and patient dosage has  been confirmed, suggest follow up testing of antithrombin III levels. Performed at [facility], [place] [address] [place], [place] [address]   CBC     Status: Abnormal   Collection Time: 01/21/19  2:51 AM  Result Value Ref Range   WBC 7.4 4.0 - 10.5 K/uL   RBC 4.24 4.22 - 5.81 MIL/uL   Hemoglobin 11.4 (L) 13.0 - 17.0 g/dL   HCT 35.6 (L) 39.0 - 52.0 %   MCV 84.0 80.0 - 100.0 fL   MCH 26.9 26.0 - 34.0 pg   MCHC 32.0 30.0 - 36.0 g/dL   RDW 14.7 11.5 - 15.5 %   Platelets 509 (H) 150 - 400 K/uL   nRBC 0.0 0.0 - 0.2 %    Comment: Performed at [facility], [place] [address] [place], [place] [address]    Ct Angio Neck W And/or Wo Contrast  Result Date: 01/20/2019 CLINICAL DATA:  53 year old male with unexplained neck swelling for 3 months. Evidence of right IJ occlusion on thyroid ultrasound  earlier today. EXAM: CT ANGIOGRAPHY NECK TECHNIQUE: Multidetector CT imaging of the neck was performed  using the standard protocol during bolus administration of intravenous contrast. Multiplanar CT image reconstructions and MIPs were obtained to evaluate the vascular anatomy. Carotid stenosis measurements (when applicable) are obtained utilizing NASCET criteria, using the distal internal carotid diameter as the denominator. CONTRAST:  124mL OMNIPAQUE IOHEXOL 350 MG/ML SOLN COMPARISON:  CTA chest reported separately. Thyroid ultrasound earlier today. FINDINGS: Skeleton: No acute or suspicious osseous lesion from the skull base to the upper chest. Mucous retention cyst in the left maxillary sinus. Mild right mastoid effusion. Upper chest: Mediastinal mass/lymphadenopathy, reported separately today. Other neck: Bulky but indistinct soft tissue masses tracking cephalad in the right neck from the thoracic inlet. Involvement of the right level 4, right level 5, right level 3 and right level 2 nodal stations. Superimposed subcutaneous edema in the region. Estimated individual lymph nodes/soft tissue mass size up to 34 millimeters long axis, 23 millimeters short axis. The level 1 nodal station seems to be spared. The left neck appears relatively spared. Mild leftward mass effect on the trachea and pharynx. The thyroid and pharyngeal soft tissue contours remain within normal limits. The glottis is closed. The parapharyngeal spaces are normal. And superior retropharyngeal space there is edema/effusion in the lower retropharyngeal space. The sublingual, submandibular and parotid spaces appear spared. Grossly negative visible brain parenchyma. Visualized orbit soft tissues are within normal limits. Aortic arch: 3 vessel arch configuration.  No arch atherosclerosis. Right carotid system: Mass effect on the brachiocephalic artery and to a lesser extent the right carotid space from the chest and neck soft tissue masses. No associated arterial stenosis. Negative right carotid bifurcation and cervical right ICA. Visible right ICA  siphon is normal. Left carotid system: Negative aside from mild left ICA tortuosity in the neck. Negative visible left ICA siphon. Vertebral arteries: Mild mass effect on the proximal right subclavian artery due to the chest and neck soft tissue masses. No right subclavian stenosis. Normal right vertebral artery origin. Patent and normal right vertebral artery to the vertebrobasilar junction. Negative visible basilar artery. Negative proximal left subclavian artery and left vertebral artery origin. Mildly non dominant left vertebral artery is patent and negative to the vertebrobasilar junction. Other findings: Delayed venous phase images were also obtained. The right sigmoid sinus and right IJ bulb are patent at the skull base, but the right jugular vein is occluded beginning at the level of the right retromandibular vein, and continuing to the right subclavian/innominate vein confluence. The right innominate vein is occluded in the upper chest on series 19, image 113. The left innominate and left internal jugular vein remain patent. The left sigmoid and transverse sinus are patent. Review of the MIP images confirms the above findings IMPRESSION: 1. Bulky soft tissue masses in the right neck and continuing into the upper chest most compatible with extensive metastatic lymphadenopathy. Individual nodal masses up to 34 mm. 2. Associated thrombosis of the Right IJ, Right Innominate Vein, and likely also the SVC - See Chest CTA reported separately. 3. Mild retropharyngeal effusion likely related to #2. 4. No arterial abnormality in the neck. Electronically Signed   By: Genevie Ann M.D.   On: 01/20/2019 17:18   Ct Angio Chest Aorta W/cm &/or Wo/cm  Addendum Date: 01/20/2019   ADDENDUM REPORT: 01/20/2019 20:45 ADDENDUM: Clarification to the report. Thrombosis of the right jugular and brachiocephalic veins and probable subclavian vein. Confluence of these vessels is surrounded/occluded by the large  mass. There is suspected  tumor occlusion/thrombosis of the distal left brachiocephalic vein and upper SVC. Electronically Signed   By: Donavan Foil M.D.   On: 01/20/2019 20:45   Result Date: 01/20/2019 CLINICAL DATA:  Jugular vein thrombosis EXAM: CT ANGIOGRAPHY CHEST WITH CONTRAST TECHNIQUE: Multidetector CT imaging of the chest was performed using the standard protocol during bolus administration of intravenous contrast. Multiplanar CT image reconstructions and MIPs were obtained to evaluate the vascular anatomy. CONTRAST:  134mL OMNIPAQUE IOHEXOL 350 MG/ML SOLN COMPARISON:  Thyroid ultrasound 01/20/2019 FINDINGS: Cardiovascular: Satisfactory opacification of the pulmonary arteries to the segmental level. No evidence of pulmonary embolism. Nonaneurysmal aorta. No dissection. Normal heart size. No significant pericardial effusion. Occluded right jugular and probable subclavian veins. Narrowed appearance of the left brachiocephalic vein as it approaches the venous confluence. Narrowed and likely subtotal occlusion of the upper SVC. Patency of the SVC as it enters the right atrium. Severe narrowing of right upper lobe pulmonary arterial vessels. Numerous collateral vessels within the mediastinum. Mediastinum/Nodes: Midline trachea.  No discrete thyroid nodule. 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 Mass measures approximately 6.2 cm AP by 5.6 cm transverse by 7.9 cm craniocaudad. It is contiguous with additional mass or nodes in the right hilar region. The mass encases/narrows the right upper lobe pulmonary vessels as well as the superior vena cava. Probable tumor invasion of the distal left brachiocephalic vessel and presumed tumor occlusion of the right brachiocephalic and jugular vessels. Partially visualized enlarged right supraclavicular nodes. Generalized edema within the soft tissues of the thoracic inlet and base of right neck. Fluid-filled esophagus with mild distal  circumferential esophageal thickening. Lungs/Pleura: Emphysema. Presumed left greater than right apical scarring with left apical calcification. Posterior right upper lobe subpleural nodular focus of airspace disease measuring 13 mm, series 6, image number 55. Irregular focus of airspace disease, also within the right upper lobe abutting the fissure, series 6, image number 79. No pleural effusion or pneumothorax. Upper Abdomen: No acute abnormality. Musculoskeletal: No chest wall abnormality. No acute or significant osseous findings. Review of the MIP images confirms the above findings. IMPRESSION: 1. No acute pulmonary embolus or aortic dissection. 2. Large poorly defined malignant-appearing infiltrative mass measuring approximately 6.2 x 5.6 x 7.9 cm within the right anterior and middle mediastinum. Suspected invasion of distal left brachiocephalic vein and probable tumor occlusion of the right subclavian and jugular veins. Suspected tumor invasion of the upper SVC with string like narrowing of the SVC, but with patency of the SVC just above the right atrium. Tumor abuts the aorta and great vessels and also results in significant narrowing of right upper lobe pulmonary arterial vessels. Incompletely visualized right supraclavicular adenopathy. Right hilar nodes and or mass. 3. At least 2 foci of somewhat nodular appearing airspace disease in the right upper lobe, either representing pneumonia or foci of neoplasm. 4. Emphysema Emphysema (ICD10-J43.9). Electronically Signed: By: Donavan Foil M.D. On: 01/20/2019 17:41   US Thyroid  Result Date: 01/20/2019 CLINICAL DATA:  Goiter, neck swelling x3 months EXAM: THYROID ULTRASOUND TECHNIQUE: Ultrasound examination of the thyroid gland and adjacent soft tissues was performed. COMPARISON:  None. FINDINGS: Parenchymal Echotexture: Mildly heterogenous Isthmus: 0.3 cm thickness Right lobe: 6 x 2.1 x 2.3 cm Left lobe: 6 x 2 x 1.8 cm  _________________________________________________________ Estimated total number of nodules >/= 1 cm: 0 Number of spongiform nodules >/=  2 cm not described below (TR1): 0 Number of mixed cystic and solid nodules >/=  1.5 cm not described below (TR2): 0 _________________________________________________________ No discrete nodules are seen within the thyroid gland. Suspected echogenic thrombus occluding the right IJ vein. IMPRESSION: 1. Thyromegaly without nodule. 2. 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 The above is in keeping with the ACR TI-RADS recommendations - J Am Coll Radiol 2017;14:587-595. Electronically Signed   By: Lucrezia Europe M.D.   On: 01/20/2019 13:27    Review of Systems  Constitutional: Positive for weight loss.  HENT: Negative.   Eyes: Negative.   Respiratory: Positive for cough. Negative for shortness of breath.   Cardiovascular: Positive for chest pain.  Gastrointestinal: Negative.   Genitourinary: Negative.   Musculoskeletal: Positive for neck pain.  Neurological: Negative for dizziness, focal weakness and headaches.  Endo/Heme/Allergies: Negative.   Psychiatric/Behavioral: Negative.    Blood pressure 106/71, pulse 81, temperature 98.5 F (36.9 C), temperature source Oral, resp. rate 16, height 5\' 11"  (1.803 m), weight 52 kg, SpO2 100 %. Physical Exam  Constitutional: He is oriented to person, place, and time. He appears well-developed and well-nourished. No distress.  HENT:  Head: Normocephalic and atraumatic.  Mouth/Throat: Oropharynx is clear and moist.  Eyes: Pupils are equal, round, and reactive to light. Conjunctivae and EOM are normal.  Neck:  Large palpable mass in the right neck.  I cannot feel individual lymph nodes.  This mass is tender.  Cardiovascular: Normal rate, regular rhythm and normal heart sounds.  No murmur heard. Respiratory: Effort normal and breath sounds normal. No respiratory distress. He exhibits no tenderness.  GI: Soft. Bowel sounds  are normal. He exhibits no distension. There is no abdominal tenderness.  Neurological: He is alert and oriented to person, place, and time. He has normal strength.  Skin: Skin is warm and dry.  Dilated veins over the right upper chest and right upper extremity.  Psychiatric: He has a normal mood and affect. His behavior is normal. Judgment and thought content normal.   ADDENDUM REPORT: 01/20/2019 20:45  ADDENDUM: Clarification to the report. Thrombosis of the right jugular and brachiocephalic veins and probable subclavian vein. Confluence of these vessels is surrounded/occluded by the large mass. There is suspected tumor occlusion/thrombosis of the distal left brachiocephalic vein and upper SVC.   Electronically Signed   By: Donavan Foil M.D.   On: 01/20/2019 20:45   Addended by Madie Reno, MD on 01/20/2019 8:48 PM    Study Result  CLINICAL DATA:  Jugular vein thrombosis  EXAM: CT ANGIOGRAPHY CHEST WITH CONTRAST  TECHNIQUE: Multidetector CT imaging of the chest was performed using the standard protocol during bolus administration of intravenous contrast. Multiplanar CT image reconstructions and MIPs were obtained to evaluate the vascular anatomy.  CONTRAST:  154mL OMNIPAQUE IOHEXOL 350 MG/ML SOLN  COMPARISON:  Thyroid ultrasound 01/20/2019  FINDINGS: Cardiovascular: Satisfactory opacification of the pulmonary arteries to the segmental level. No evidence of pulmonary embolism. Nonaneurysmal aorta. No dissection. Normal heart size. No significant pericardial effusion.  Occluded right jugular and probable subclavian veins. Narrowed appearance of the left brachiocephalic vein as it approaches the venous confluence. Narrowed and likely subtotal occlusion of the upper SVC. Patency of the SVC as it enters the right atrium. Severe narrowing of right upper lobe pulmonary arterial vessels. Numerous collateral vessels within the  mediastinum.  Mediastinum/Nodes: Midline trachea.  No discrete thyroid nodule.  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 Mass measures approximately 6.2 cm AP by 5.6 cm transverse by 7.9 cm craniocaudad. It is contiguous with additional  mass or nodes in the right hilar region. The mass encases/narrows the right upper lobe pulmonary vessels as well as the superior vena cava. Probable tumor invasion of the distal left brachiocephalic vessel and presumed tumor occlusion of the right brachiocephalic and jugular vessels. Partially visualized enlarged right supraclavicular nodes. Generalized edema within the soft tissues of the thoracic inlet and base of right neck. Fluid-filled esophagus with mild distal circumferential esophageal thickening.  Lungs/Pleura: Emphysema. Presumed left greater than right apical scarring with left apical calcification. Posterior right upper lobe subpleural nodular focus of airspace disease measuring 13 mm, series 6, image number 55. Irregular focus of airspace disease, also within the right upper lobe abutting the fissure, series 6, image number 79. No pleural effusion or pneumothorax.  Upper Abdomen: No acute abnormality.  Musculoskeletal: No chest wall abnormality. No acute or significant osseous findings.  Review of the MIP images confirms the above findings.  IMPRESSION: 1. No acute pulmonary embolus or aortic dissection. 2. Large poorly defined malignant-appearing infiltrative mass measuring approximately 6.2 x 5.6 x 7.9 cm within the right anterior and middle mediastinum. Suspected invasion of distal left brachiocephalic vein and probable tumor occlusion of the right subclavian and jugular veins. Suspected tumor invasion of the upper SVC with string like narrowing of the SVC, but with patency of the SVC just above the right atrium. Tumor abuts the aorta and great vessels and also  results in significant narrowing of right upper lobe pulmonary arterial vessels. Incompletely visualized right supraclavicular adenopathy. Right hilar nodes and or mass. 3. At least 2 foci of somewhat nodular appearing airspace disease in the right upper lobe, either representing pneumonia or foci of neoplasm. 4. Emphysema  Emphysema (ICD10-J43.9).  Electronically Signed: By: Donavan Foil M.D. On: 01/20/2019 17:41       Assessment/Plan:  This 53 year old smoker has a 6 x 8 cm anterior and middle mediastinal mass extending up into the right neck with suspected invasion of the distal left brachiocephalic vein and tumor occlusion of the right subclavian and jugular veins.  There is suspected tumor invasion of the SVC with string-like narrowing.  There appears to be right supraclavicular adenopathy as well as right hilar lymphadenopathy.  The differential diagnosis in this patient would include lymphoma, lung cancer, thymic malignancy or sarcoma and a few others.  I think the most accessible site for biopsy is probably the right neck mass.  The mediastinal component could be biopsied through a mediastinotomy but is certainly more invasive and the tumor is pushing the brachiocephalic vein and SVC anteriorly which would increase the risk of injuring these structures.  ENT has apparently been consulted for biopsy of the right neck mass.  A core needle biopsy of the right neck/ supraclavicular mass could probably be obtained under ultrasound guidance by IR but you would need to check with them about the feasibility of that.  After tissue diagnosis this will need to be treated with XRT promptly to try to prevent complete occlusion of the SVC and improve his pain symptoms.  Fernande Boyden Kathlene Yano 01/21/2019, 2:25 PM

## 2019-01-21 NOTE — Progress Notes (Signed)
[facility] for Heparin Indication: right IJ thrombosis  Allergies  Allergen Reactions  . Pregabalin Other (See Comments)    Homicidal idealizations, pain in lower extremities     Patient Measurements: Height: 5\' 11"  (180.3 cm) Weight: 114 lb 10.2 oz (52 kg) IBW/kg (Calculated) : 75.3 Heparin Dosing Weight: 52.2 kg  Vital Signs: Temp: 98.4 F (36.9 C) (09/13 2017) Temp Source: Oral (09/13 2017) BP: 99/71 (09/13 2017) Pulse Rate: 87 (09/13 2017)  Labs: Recent Labs    01/20/19 1157 01/20/19 1357 01/21/19 0251 01/21/19 1257 01/21/19 2239  HGB 12.0*  --  11.4*  --   --   HCT 37.4*  --  35.6*  --   --   PLT 382  --  509*  --   --   APTT  --  30  --   --   --   LABPROT  --  14.8  --   --   --   INR  --  1.2  --   --   --   HEPARINUNFRC  --   --  <0.10* <0.10* <0.10*  CREATININE 0.71  --   --   --   --     Estimated Creatinine Clearance: 79.4 mL/min (by C-G formula based on SCr of 0.71 mg/dL).  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  There was suspected invasion of the brachiocephalic vein and probable tumor occlusion of the right subclavian and jugular veins.  There was suspected tumor invasion of the SVC with string-like narrowing of the SVC.  There is also significant narrowing of the right upper lobe pulmonary artery vessels.    9/14 AM update: heparin level remains sub-therapeutic, no issues per RN  Goal of Therapy:  Heparin level 0.3-0.7 units/ml Monitor platelets by anticoagulation protocol: Yes   Plan: Heparin 2000 units Drain heparin to 1150 units/hr Re-check heparin level in 8 hours Daily HL / CBC Monitor for signs / symptoms of bleeding  Narda Bonds, PharmD, BCPS Clinical Pharmacist Phone: [phone]

## 2019-01-21 NOTE — Progress Notes (Signed)
  Echocardiogram 2D Echocardiogram has been performed.  David Berry 01/21/2019, 6:02 PM

## 2019-01-21 NOTE — Progress Notes (Addendum)
53 year old gentleman with no prior medical history presents to ED for right upper extremity pain and right sided neck mass and swelling. CT angiogram of the neck with and without contrast showed Bulky soft tissue masses in the right neck and continuing into the upper chest most compatible with extensive metastatic lymphadenopathy. Individual nodal masses up to 34 mm. 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   CT angiogram of the chest with contrast showed  Large poorly defined malignant-appearing infiltrative mass measuring approximately 6.2 x 5.6 x 7.9 cm within the right anterior and middle mediastinum. Suspected invasion of distal left brachiocephalic vein and probable tumor occlusion of the right subclavian and jugular veins. Suspected tumor invasion of the upper SVC with string like narrowing of the SVC, but with patency of the SVC just above the right atrium. Tumor abuts the aorta and great vessels and also results in significant narrowing of right upper lobe pulmonary arterial vessels. Incompletely visualized right supraclavicular adenopathy. Right hilar nodes and or mass.   Thrombosis of the right jugular and brachiocephalic veins and probable subclavian vein. Confluence of these vessels is surrounded/occluded by the large mass. There is suspected tumor occlusion/thrombosis of the distal left brachiocephalic vein and upper SVC.   CT surgery were consulted by EDP Dr. Cyndia Bent will see the patient in consultation today.   Due to the nature of the metastatic lymphadenopathy oncology Dr Alvy Bimler consulted who recommended to get ENT for excisional biopsy and echocardiogram.  Discussed with Dr. Janace Hoard with ENT who suggested to call first thing in the morning tomorrow to the office and speak with the on-call physician  for excisional biopsy.  We will keep the patient n.p.o. after midnight possible procedure in the morning.  We will probably  need to transfer the patient to [facility] after the biopsy is done to initiate the RTX and chemotherapy if lymphoma is confirmed.   Meanwhile continue with IV morphine for pain control and IV Zosyn for empiric antibiotics He was also started on IV heparin for extensive thrombosis of the right IJ right innominate vein and the SVC.  He was started on clear liquid diet and will be made n.p.o. after midnight. IR also consulted for possible biopsy of the lymph nodes and a port placement if needed.   Patient seen and examined at bedside He is alert and comfortable, does not appear to be in any distress patent airways.  CVS S1S2, regular rate rhythm Lungs  Diminished breath sounds through out.  abd is soft NT Extremities: no cyanosis or clubbing. Left elbow and forearm swelling form IV infiltrations.  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    Hosie Poisson, MD [phone]

## 2019-01-21 NOTE — Progress Notes (Signed)
Received pt from ED, alert oriented x4 with VSS. Pt having mild pain in neck but verbalized doesn't need anything for now. Oriented to room, bed controls and plan of care. Started heparin drip running at 8.5 ml/hr in RFA. Left lying comfortably in bed with call bell at reach. Will continue to monitor.

## 2019-01-21 NOTE — Progress Notes (Signed)
[facility] for heparin Indication: right IJ thrombosis  Allergies  Allergen Reactions  . Pregabalin Other (See Comments)    Homicidal idealizations, pain in lower extremities      Patient Measurements: Height: 5\' 11"  (180.3 cm) Weight: 114 lb 10.2 oz (52 kg) IBW/kg (Calculated) : 75.3 Heparin Dosing Weight: 52.2 kg  Vital Signs: Temp: 98.2 F (36.8 C) (09/12 2101) Temp Source: Oral (09/12 2101) BP: 121/93 (09/12 2101) Pulse Rate: 90 (09/12 2101)  Labs: Recent Labs    01/20/19 1157 01/20/19 1357 01/21/19 0251  HGB 12.0*  --  11.4*  HCT 37.4*  --  35.6*  PLT 382  --  509*  APTT  --  30  --   LABPROT  --  14.8  --   INR  --  1.2  --   HEPARINUNFRC  --   --  <0.10*  CREATININE 0.71  --   --     Estimated Creatinine Clearance: 79.4 mL/min (by C-G formula based on SCr of 0.71 mg/dL).  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  Heparin level subtherapeutic.  Goal of Therapy:  Heparin level 0.3-0.7 units/ml Monitor platelets by anticoagulation protocol: Yes   Plan: Heparin 3000 units IV bolus, then continue heparin 850 units/hr   Phillis Knack, PharmD, BCPS 01/21/2019,5:09 AM

## 2019-01-21 NOTE — Progress Notes (Addendum)
[facility] for heparin Indication: right IJ thrombosis  Allergies  Allergen Reactions  . Pregabalin Other (See Comments)    Homicidal idealizations, pain in lower extremities      Patient Measurements: Height: 5\' 11"  (180.3 cm) Weight: 114 lb 10.2 oz (52 kg) IBW/kg (Calculated) : 75.3 Heparin Dosing Weight: 52.2 kg  Vital Signs: Temp: 98.5 F (36.9 C) (09/13 1421) Temp Source: Oral (09/13 1421) BP: 106/71 (09/13 1421) Pulse Rate: 81 (09/13 1421)  Labs: Recent Labs    01/20/19 1157 01/20/19 1357 01/21/19 0251 01/21/19 1257  HGB 12.0*  --  11.4*  --   HCT 37.4*  --  35.6*  --   PLT 382  --  509*  --   APTT  --  30  --   --   LABPROT  --  14.8  --   --   INR  --  1.2  --   --   HEPARINUNFRC  --   --  <0.10* <0.10*  CREATININE 0.71  --   --   --     Estimated Creatinine Clearance: 79.4 mL/min (by C-G formula based on SCr of 0.71 mg/dL).  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  There was suspected invasion of the brachiocephalic vein and probable tumor occlusion of the right subclavian and jugular veins.  There was suspected tumor invasion of the SVC with string-like narrowing of the SVC.  There is also significant narrowing of the right upper lobe pulmonary artery vessels.    Heparin level subtherapeutic x2 today. hgb 11.4, hct 45.6, platelets 509. CBC stable. Will increase heparin and check level in 8 hours.  Goal of Therapy:  Heparin level 0.3-0.7 units/ml Monitor platelets by anticoagulation protocol: Yes   Plan: Increase heparin to 1000 units / hr Follow-up heparin level in 8 hours Daily HL / CBC Monitor for signs / symptoms of bleeding  Thank you for the interesting consult and for involving pharmacy in this patient's  care.  Tamela Gammon, PharmD 01/21/2019 3:13 PM PGY-2 Pharmacy Administration Resident Direct Phone: [phone] Please check [email or web address] for unit-specific pharmacist phone numbers

## 2019-01-22 ENCOUNTER — Encounter: Payer: Self-pay | Admitting: Critical Care Medicine

## 2019-01-22 ENCOUNTER — Inpatient Hospital Stay (HOSPITAL_COMMUNITY): Payer: Medicaid Other

## 2019-01-22 DIAGNOSIS — R222 Localized swelling, mass and lump, trunk: Secondary | ICD-10-CM

## 2019-01-22 DIAGNOSIS — I8290 Acute embolism and thrombosis of unspecified vein: Secondary | ICD-10-CM

## 2019-01-22 DIAGNOSIS — I871 Compression of vein: Secondary | ICD-10-CM

## 2019-01-22 LAB — CBC
HCT: 37.1 % — ABNORMAL LOW (ref 39.0–52.0)
Hemoglobin: 11.9 g/dL — ABNORMAL LOW (ref 13.0–17.0)
MCH: 27.3 pg (ref 26.0–34.0)
MCHC: 32.1 g/dL (ref 30.0–36.0)
MCV: 85.1 fL (ref 80.0–100.0)
Platelets: 530 10*3/uL — ABNORMAL HIGH (ref 150–400)
RBC: 4.36 MIL/uL (ref 4.22–5.81)
RDW: 14.6 % (ref 11.5–15.5)
WBC: 6.7 10*3/uL (ref 4.0–10.5)
nRBC: 0 % (ref 0.0–0.2)

## 2019-01-22 LAB — SURGICAL PCR SCREEN
MRSA, PCR: NEGATIVE
Staphylococcus aureus: POSITIVE — AB

## 2019-01-22 LAB — ECHOCARDIOGRAM COMPLETE
Height: 71 in
Weight: 1834.23 oz

## 2019-01-22 IMAGING — CT CT BIOPSY
1 of 3 series · 12 of 32 positions shown, 18 images · non-contrast
Comparison: none

CLINICAL DATA: Mediastinal mass

[Series 3: i-spiral 5.0 b40f · axial · 0.53mm/px · z∈[+1150,+1342]mm · 12 of 65 slices shown, 18 images]
[im 5/65  soft-tissue]
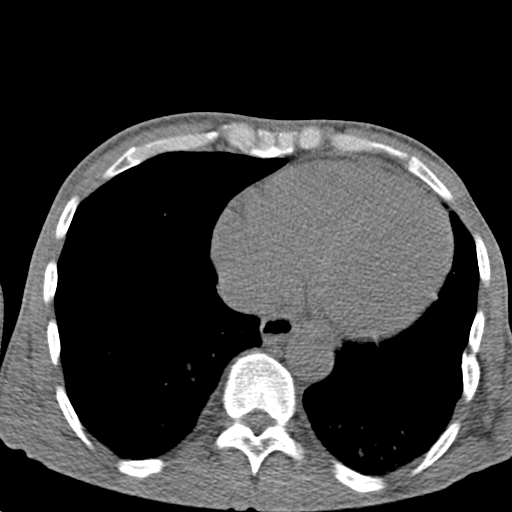
[im 5/65  bone]
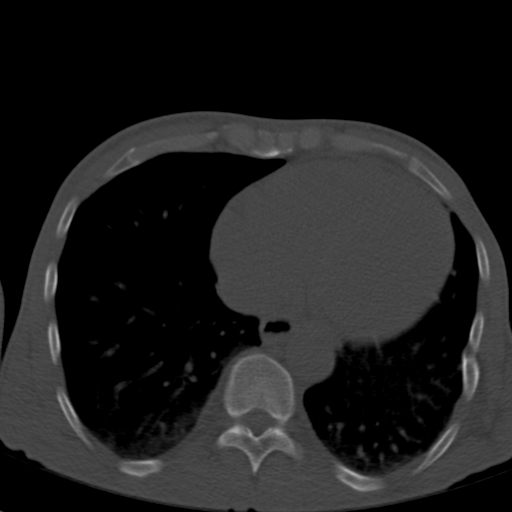
[im 10/65  soft-tissue]
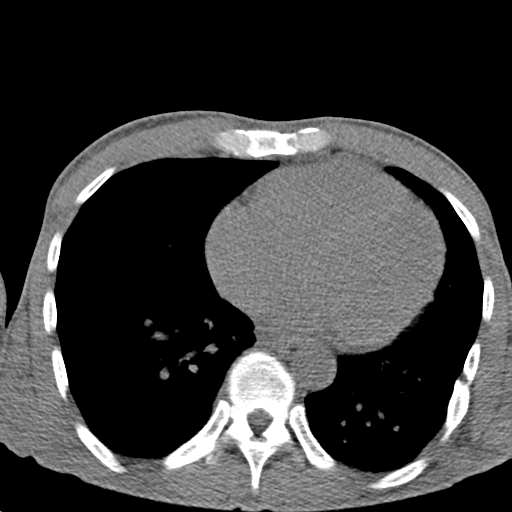
[im 15/65  soft-tissue]
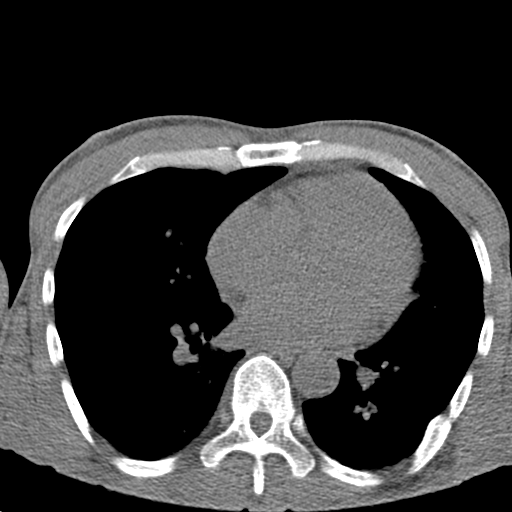
[im 20/65  soft-tissue]
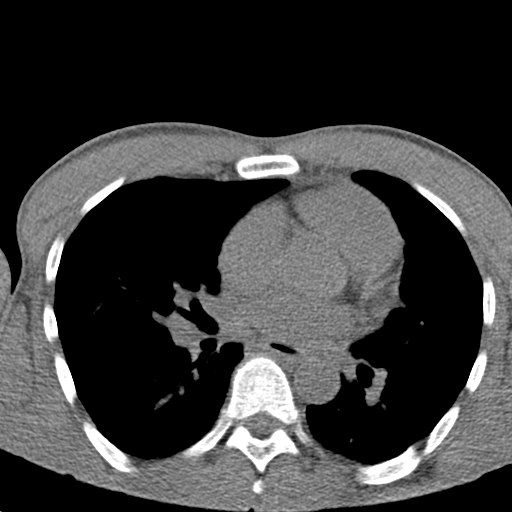
[im 25/65  soft-tissue]
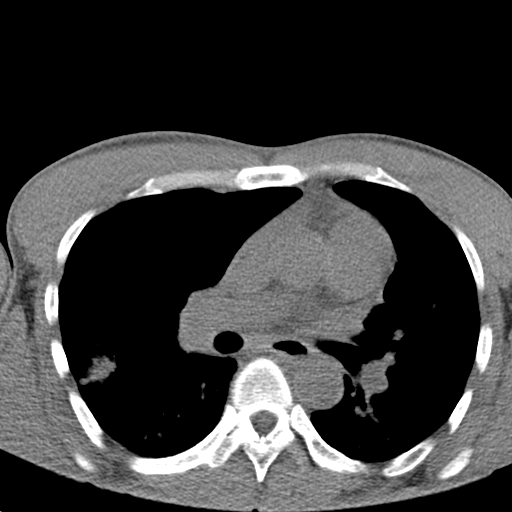
[im 30/65  soft-tissue]
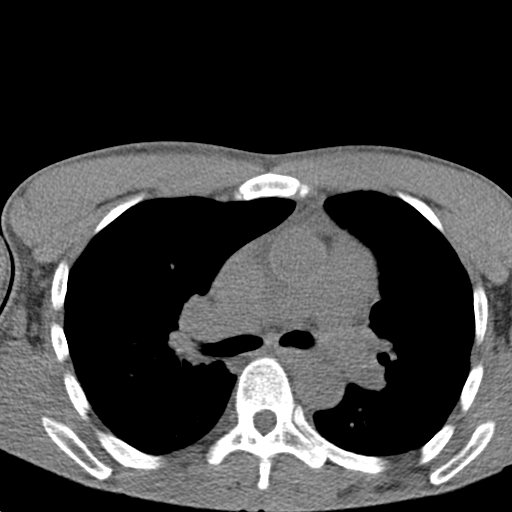
[im 35/65  soft-tissue]
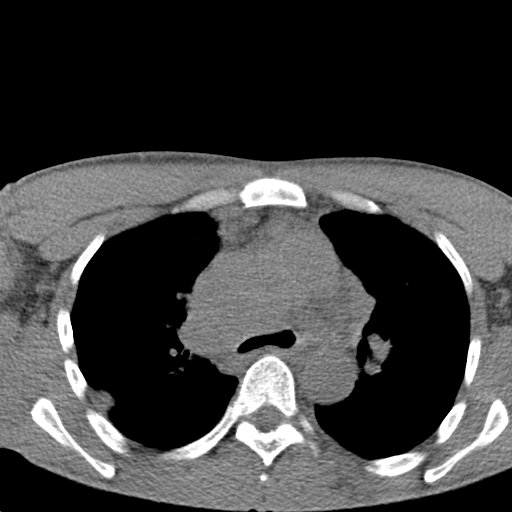
[im 40/65  soft-tissue]
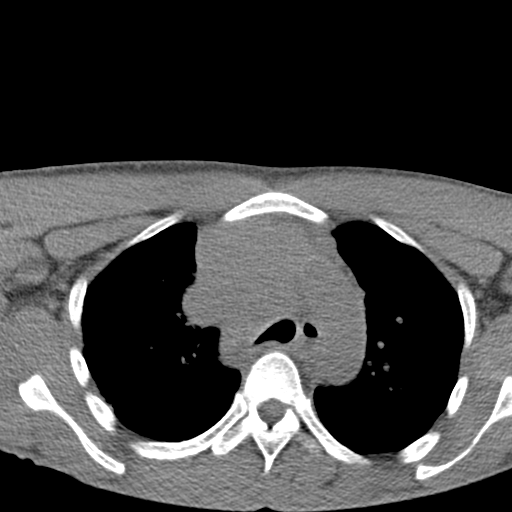
[im 45/65  soft-tissue]
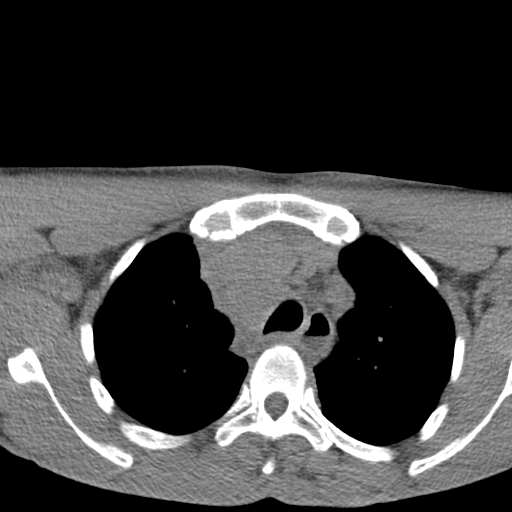
[im 45/65  lung]
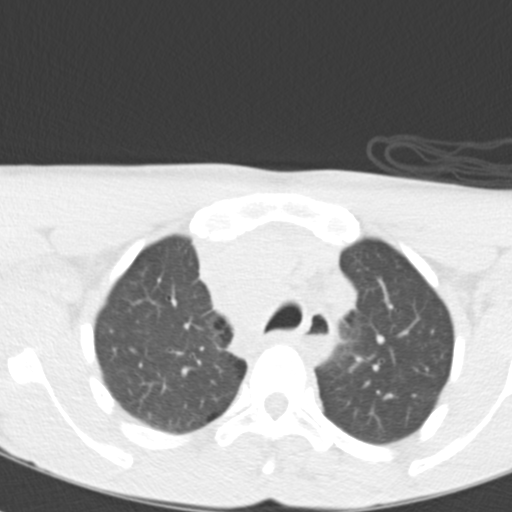
[im 45/65  bone]
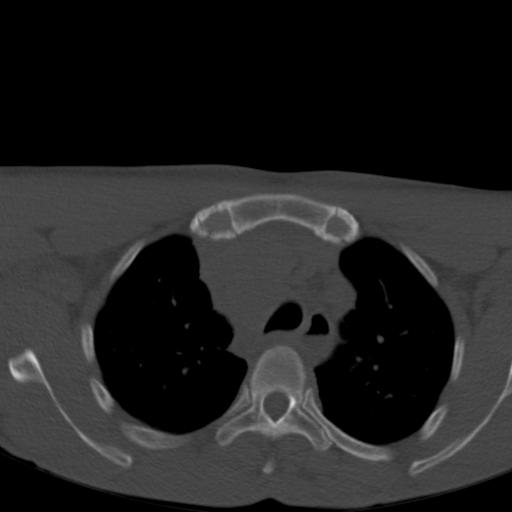
[im 50/65  soft-tissue]
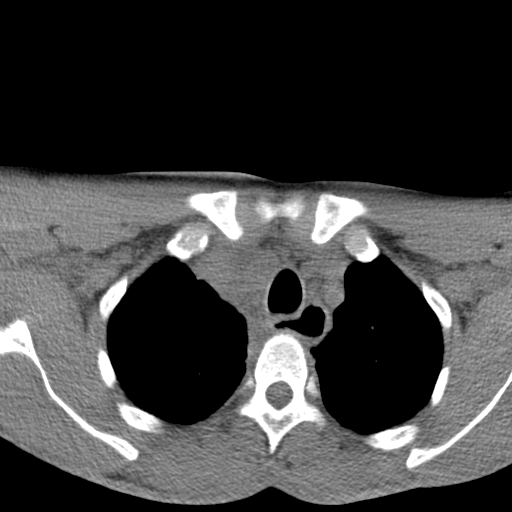
[im 50/65  lung]
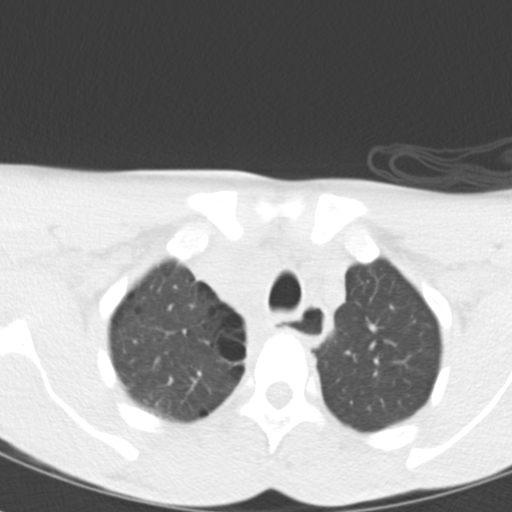
[im 55/65  soft-tissue]
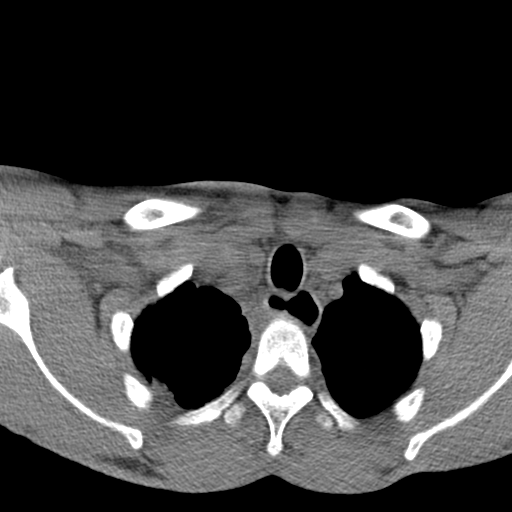
[im 55/65  lung]
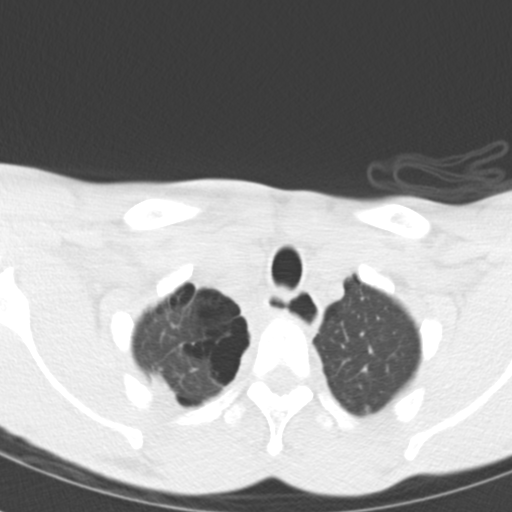
[im 60/65  soft-tissue]
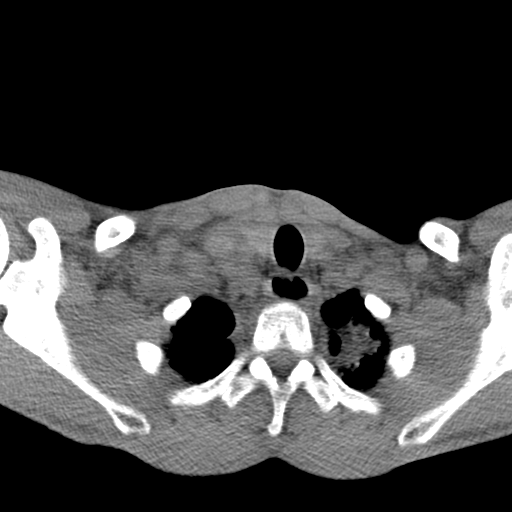
[im 60/65  lung]
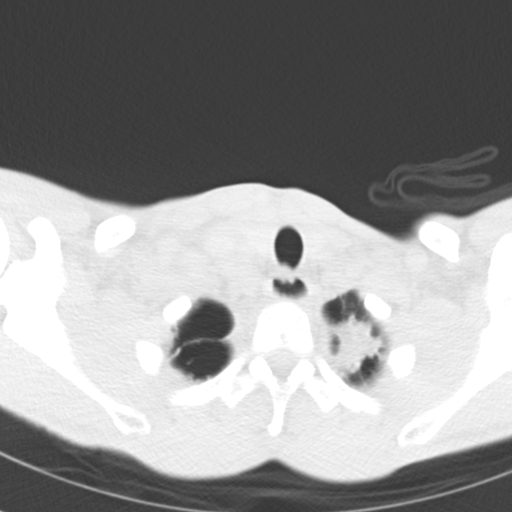

[12 of 32 positions shown; findings below may reference images not displayed]

EXAM:
CT GUIDED CORE BIOPSY OF MEDIASTINAL MASS

ANESTHESIA/SEDATION:
Intravenous Fentanyl [0G] and Versed 1mg were administered as
conscious sedation during continuous monitoring of the patient?s
level of consciousness and physiological / cardiorespiratory status
by the radiology RN, with a total moderate sedation time of 16
minutes.

PROCEDURE:
The procedure risks, benefits, and alternatives were explained to
the patient. Questions regarding the procedure were encouraged and
answered. The patient understands and consents to the procedure.

Select axial scans through the thorax were obtained. The anterior
mediastinal mass was localized and an appropriate skin entry site
was identified and marked.

The operative field was prepped with chlorhexidinein a sterile
fashion, and a sterile drape was applied covering the operative
field. A sterile gown and sterile gloves were used for the
procedure. Local anesthesia was provided with 1% Lidocaine.

Under CT fluoroscopic guidance, a 17 gauge trocar needle was
advanced to the margin of the lesion. Once needle tip position was
confirmed, coaxial 18-gauge core biopsy samples were obtained,
submitted in saline to surgical pathology. The guide needle was
removed.

Postprocedure scan shows no hematoma, pneumothorax, or other
apparent complication.

COMPLICATIONS:
None immediate
FINDINGS: Anterior mediastinal mass was localized and representative core
biopsy samples obtained as above.
IMPRESSION: Technically successful CT-guided core biopsy of anterior mediastinal
mass.

## 2019-01-22 MED ORDER — MIDAZOLAM HCL 2 MG/2ML IJ SOLN
INTRAMUSCULAR | Status: AC | PRN
  Administered 2019-01-22 (×2): 0.5 mg via INTRAVENOUS

## 2019-01-22 MED ORDER — FENTANYL CITRATE (PF) 100 MCG/2ML IJ SOLN
INTRAMUSCULAR | Status: AC | PRN
  Administered 2019-01-22 (×2): 25 ug via INTRAVENOUS

## 2019-01-22 MED ORDER — MIDAZOLAM HCL 2 MG/2ML IJ SOLN
INTRAMUSCULAR | Status: AC
  Filled 2019-01-22: qty 2

## 2019-01-22 MED ORDER — HEPARIN (PORCINE) 25000 UT/250ML-% IV SOLN
1600.0000 [IU]/h | INTRAVENOUS | Status: DC
  Administered 2019-01-22: 22:00:00 1200 [IU]/h via INTRAVENOUS
  Administered 2019-01-23: 1450 [IU]/h via INTRAVENOUS
  Administered 2019-01-24 – 2019-01-25 (×3): 1500 [IU]/h via INTRAVENOUS
  Administered 2019-01-26 – 2019-01-27 (×3): 1600 [IU]/h via INTRAVENOUS
  Filled 2019-01-22 (×9): qty 250

## 2019-01-22 MED ORDER — SODIUM CHLORIDE 0.9 % IV SOLN
INTRAVENOUS | Status: AC | PRN
  Administered 2019-01-22: 10 mL/h via INTRAVENOUS

## 2019-01-22 MED ORDER — HYDROCODONE-ACETAMINOPHEN 5-325 MG PO TABS
1.0000 | ORAL_TABLET | ORAL | Status: DC | PRN
  Administered 2019-01-22: 22:00:00 1 via ORAL
  Administered 2019-01-23 – 2019-01-29 (×16): 2 via ORAL
  Filled 2019-01-22: qty 2
  Filled 2019-01-22: qty 1
  Filled 2019-01-22 (×7): qty 2
  Filled 2019-01-22: qty 1
  Filled 2019-01-22 (×9): qty 2

## 2019-01-22 MED ORDER — FENTANYL CITRATE (PF) 100 MCG/2ML IJ SOLN
INTRAMUSCULAR | Status: AC
  Filled 2019-01-22: qty 2

## 2019-01-22 NOTE — Progress Notes (Signed)
[facility] for Heparin Indication: right IJ thrombosis  Allergies  Allergen Reactions  . Pregabalin Other (See Comments)    Homicidal idealizations, pain in lower extremities     Patient Measurements: Height: 5\' 11"  (180.3 cm) Weight: 114 lb 10.2 oz (52 kg) IBW/kg (Calculated) : 75.3 Heparin Dosing Weight: 52.2 kg  Vital Signs: Temp: 97.7 F (36.5 C) (09/14 1604) Temp Source: Oral (09/14 1604) BP: 108/89 (09/14 1604) Pulse Rate: 72 (09/14 1604)  Labs: Recent Labs    01/20/19 1157 01/20/19 1357 01/21/19 0251 01/21/19 1257 01/21/19 2239 01/22/19 0800  HGB 12.0*  --  11.4*  --   --  11.9*  HCT 37.4*  --  35.6*  --   --  37.1*  PLT 382  --  509*  --   --  530*  APTT  --  30  --   --   --   --   LABPROT  --  14.8  --   --   --   --   INR  --  1.2  --   --   --   --   HEPARINUNFRC  --   --  <0.10* <0.10* <0.10*  --   CREATININE 0.71  --   --   --   --   --     Estimated Creatinine Clearance: 79.4 mL/min (by C-G formula based on SCr of 0.71 mg/dL).  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   Now s/p mass biopsy and to resume heparin at 2200 pm tonight   Goal of Therapy:  Heparin level 0.3-0.7 units/ml Monitor platelets by anticoagulation protocol: Yes   Plan: Resume heparin at 1200 units / hr at 10 pm Re-check heparin level in 8 hours Daily HL / CBC Monitor for signs / symptoms of bleeding  Thank you Anette Guarneri, PharmD [phone]

## 2019-01-22 NOTE — Progress Notes (Signed)
PROGRESS NOTE    David Berry  HYW:[id number] DOB: July 20, 1965 DOA: 01/20/2019 PCP: Patient, No Pcp Per    Brief Narrative:  53 year old gentleman with no prior medical history presents to ED for right upper extremity pain and right sided neck mass and swelling. CT angiogram of the neck with and without contrast showed Bulky soft tissue masses in the right neck and continuing into the upper chest most compatible with extensive metastatic lymphadenopathy. Individual nodal masses up to 34 mm. 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   CT angiogram of the chest with contrast showed  Large poorly defined malignant-appearing infiltrative mass measuring approximately 6.2 x 5.6 x 7.9 cm within the right anterior and middle mediastinum. Suspected invasion of distal left brachiocephalic vein and probable tumor occlusion of the right subclavian and jugular veins. Suspected tumor invasion of the upper SVC with string like narrowing of the SVC, but with patency of the SVC just above the right atrium. Tumor abuts the aorta and great vessels and also results in significant narrowing of right upper lobe pulmonary arterial vessels. Incompletely visualized right supraclavicular adenopathy. Right hilar nodes and or mass.   Thrombosis of the right jugular and brachiocephalic veins and probable subclavian vein. Confluence of these vessels is surrounded/occluded by the large mass. There is suspected tumor occlusion/thrombosis of the distal left brachiocephalic vein and upper SVC.   CT surgery were consulted by EDP and recommendations given.    Due to the nature of the metastatic lymphadenopathy oncology Dr Alvy Bimler consulted who recommended to get ENT for excisional biopsy and echocardiogram.  Discussed with Dr. Janace Hoard requested to call in the morning for the consult. Requested Dr. Glenetta Borg with ENT for an excisional biopsy defer to IR for a  transcutaneous biopsy of the mediastinal mass. Meanwhile PCCM also consulted to see if he needs a bronchoscopy with transtracheal biopsy.  At this time IR  will proceed with image guided biopsy via transcutaneous needs of the anterior mediastinal mass  Patient is currently n.p.o. and heparin drip has been stopped this morning for the procedure.  We will probably need to transfer the patient to [facility] after the biopsy is done to initiate the RTX and chemotherapy if lymphoma is confirmed.  Assessment & Plan:   Active Problems:   Neck mass   Mass of thoracic structure   Thrombus   Extravasation injury of IV catheter site with other complication (HCC)   Skin rash   Neck mass:  Plan for IR guided biopsy of the neck mass. Follow the results and transfer the patient to [facility], once results are back to see if its lymphoma for Xrt. Pain control.    Right IJ thrombosis and SVC syndrome:  Started on IV heparin, continue the same for now.    ? Post obstructive pneumonia:  On IV zosyn empirically    DVT prophylaxis: heparin.  Code Status: full code.  Family Communication: discussed with family at bedside and over the phone.  Disposition Plan: pending further work up.    Consultants:   Dr Cyndia Bent with CT surgery.   ENT   IR  Oncology Dr Alvy Bimler.   PCCM.   Procedures:  IR guided biopsy.   Antimicrobials: IV zosyn.   Subjective: Agitated and angry.  But is not in distress and able to maintain airway with good sats.   Objective: Vitals:   01/21/19 0625 01/21/19 1421 01/21/19 2017 01/22/19 0550  BP: 102/65 106/71 99/71 (!) 94/57  Pulse: 83 81 87 84  Resp: 17 16 18    Temp: 98.2 F (36.8 C) 98.5 F (36.9 C) 98.4 F (36.9 C) 98.2 F (36.8 C)  TempSrc: Oral Oral Oral Oral  SpO2: 98% 100% 98% 98%  Weight:      Height:        Intake/Output Summary (Last 24 hours) at 01/22/2019 1301 Last data filed at 01/22/2019 0900 Gross per 24 hour  Intake 616.34 ml  Output 1300 ml  Net  -683.66 ml   Filed Weights   01/20/19 2000 01/20/19 2100  Weight: 52.2 kg 52 kg    Examination:  General exam: right neck mass , not in distress.  Respiratory system: Clear to auscultation. Respiratory effort normal. Cardiovascular system: S1 & S2 heard, RRR.  Gastrointestinal system: Abdomen is nondistended, soft and nontender. No organomegaly or masses felt. Normal bowel sounds heard. Central nervous system: Alert and oriented. No focal neurological deficits. Extremities: Symmetric 5 x 5 power. Psychiatry: agitated and angry.     Data Reviewed: I have personally reviewed following labs and imaging studies  CBC: Recent Labs  Lab 01/20/19 1157 01/21/19 0251 01/22/19 0800  WBC 9.0 7.4 6.7  NEUTROABS 6.0  --   --   HGB 12.0* 11.4* 11.9*  HCT 37.4* 35.6* 37.1*  MCV 85.0 84.0 85.1  PLT 382 509* 102*   Basic Metabolic Panel: Recent Labs  Lab 01/20/19 1157  NA 137  K 4.4  CL 103  CO2 21*  GLUCOSE 87  BUN 8  CREATININE 0.71  CALCIUM 10.0   GFR: Estimated Creatinine Clearance: 79.4 mL/min (by C-G formula based on SCr of 0.71 mg/dL). Liver Function Tests: No results for input(s): AST, ALT, ALKPHOS, BILITOT, PROT, ALBUMIN in the last 168 hours. No results for input(s): LIPASE, AMYLASE in the last 168 hours. No results for input(s): AMMONIA in the last 168 hours. Coagulation Profile: Recent Labs  Lab 01/20/19 1357  INR 1.2   Cardiac Enzymes: No results for input(s): CKTOTAL, CKMB, CKMBINDEX, TROPONINI in the last 168 hours. BNP (last 3 results) No results for input(s): PROBNP in the last 8760 hours. HbA1C: No results for input(s): HGBA1C in the last 72 hours. CBG: No results for input(s): GLUCAP in the last 168 hours. Lipid Profile: No results for input(s): CHOL, HDL, LDLCALC, TRIG, CHOLHDL, LDLDIRECT in the last 72 hours. Thyroid Function Tests: Recent Labs    01/20/19 1157  TSH 2.020   Anemia Panel: No results for input(s): VITAMINB12, FOLATE,  FERRITIN, TIBC, IRON, RETICCTPCT in the last 72 hours. Sepsis Labs: No results for input(s): PROCALCITON, LATICACIDVEN in the last 168 hours.  Recent Results (from the past 240 hour(s))  SARS Coronavirus 2 [facility] order, Performed in [facility] hospital lab) Nasopharyngeal Nasopharyngeal Swab     Status: None   Collection Time: 01/20/19  6:30 PM   Specimen: Nasopharyngeal Swab  Result Value Ref Range Status   SARS Coronavirus 2 NEGATIVE NEGATIVE Final    Comment: (NOTE) If result is NEGATIVE SARS-CoV-2 target nucleic acids are NOT DETECTED. The SARS-CoV-2 RNA is generally detectable in upper and lower  respiratory specimens during the acute phase of infection. The lowest  concentration of SARS-CoV-2 viral copies this assay can detect is 250  copies / mL. A negative result does not preclude SARS-CoV-2 infection  and should not be used as the sole basis for treatment or other  patient management decisions.  A negative result may occur with  improper specimen collection / handling, submission of  specimen other  than nasopharyngeal swab, presence of viral mutation(s) within the  areas targeted by this assay, and inadequate number of viral copies  (<250 copies / mL). A negative result must be combined with clinical  observations, patient history, and epidemiological information. If result is POSITIVE SARS-CoV-2 target nucleic acids are DETECTED. The SARS-CoV-2 RNA is generally detectable in upper and lower  respiratory specimens dur ing the acute phase of infection.  Positive  results are indicative of active infection with SARS-CoV-2.  Clinical  correlation with patient history and other diagnostic information is  necessary to determine patient infection status.  Positive results do  not rule out bacterial infection or co-infection with other viruses. If result is PRESUMPTIVE POSTIVE SARS-CoV-2 nucleic acids MAY BE PRESENT.   A presumptive positive result was obtained on the  submitted specimen  and confirmed on repeat testing.  While 2019 novel coronavirus  (SARS-CoV-2) nucleic acids may be present in the submitted sample  additional confirmatory testing may be necessary for epidemiological  and / or clinical management purposes  to differentiate between  SARS-CoV-2 and other Sarbecovirus currently known to infect humans.  If clinically indicated additional testing with an alternate test  methodology [phone]) is advised. The SARS-CoV-2 RNA is generally  detectable in upper and lower respiratory sp ecimens during the acute  phase of infection. The expected result is Negative. Fact Sheet for Patients:  StrictlyIdeas.no Fact Sheet for Healthcare Providers: BankingDealers.co.za This test is not yet approved or cleared by the [place] FDA and has been authorized for detection and/or diagnosis of SARS-CoV-2 by FDA under an Emergency Use Authorization (EUA).  This EUA will remain in effect (meaning this test can be used) for the duration of the COVID-19 declaration under Section 564(b)(1) of the Act, 21 U.S.C. section 360bbb-3(b)(1), unless the authorization is terminated or revoked sooner. Performed at [facility], [place] [address] [place], [place] [address]   Surgical PCR screen     Status: Abnormal   Collection Time: 01/22/19  6:01 AM   Specimen: Nasal Mucosa; Nasal Swab  Result Value Ref Range Status   MRSA, PCR NEGATIVE NEGATIVE Final   Staphylococcus aureus POSITIVE (A) NEGATIVE Final    Comment: (NOTE) The Xpert SA Assay (FDA approved for NASAL specimens in patients 35 years of age and older), is one component of a comprehensive surveillance program. It is not intended to diagnose infection nor to guide or monitor treatment. Performed at [facility], [place] [address] [place], [place] [address]          Radiology Studies: Ct Angio Neck W And/or Wo Contrast  Result Date:  01/20/2019 CLINICAL DATA:  53 year old male with unexplained neck swelling for 3 months. Evidence of right IJ occlusion on thyroid ultrasound earlier today. EXAM: CT ANGIOGRAPHY NECK TECHNIQUE: Multidetector CT imaging of the neck was performed using the standard protocol during bolus administration of intravenous contrast. Multiplanar CT image reconstructions and MIPs were obtained to evaluate the vascular anatomy. Carotid stenosis measurements (when applicable) are obtained utilizing NASCET criteria, using the distal internal carotid diameter as the denominator. CONTRAST:  159mL OMNIPAQUE IOHEXOL 350 MG/ML SOLN COMPARISON:  CTA chest reported separately. Thyroid ultrasound earlier today. FINDINGS: Skeleton: No acute or suspicious osseous lesion from the skull base to the upper chest. Mucous retention cyst in the left maxillary sinus. Mild right mastoid effusion. Upper chest: Mediastinal mass/lymphadenopathy, reported separately today. Other neck: Bulky but indistinct soft tissue masses tracking cephalad in the right neck  from the thoracic inlet. Involvement of the right level 4, right level 5, right level 3 and right level 2 nodal stations. Superimposed subcutaneous edema in the region. Estimated individual lymph nodes/soft tissue mass size up to 34 millimeters long axis, 23 millimeters short axis. The level 1 nodal station seems to be spared. The left neck appears relatively spared. Mild leftward mass effect on the trachea and pharynx. The thyroid and pharyngeal soft tissue contours remain within normal limits. The glottis is closed. The parapharyngeal spaces are normal. And superior retropharyngeal space there is edema/effusion in the lower retropharyngeal space. The sublingual, submandibular and parotid spaces appear spared. Grossly negative visible brain parenchyma. Visualized orbit soft tissues are within normal limits. Aortic arch: 3 vessel arch configuration.  No arch atherosclerosis. Right carotid system:  Mass effect on the brachiocephalic artery and to a lesser extent the right carotid space from the chest and neck soft tissue masses. No associated arterial stenosis. Negative right carotid bifurcation and cervical right ICA. Visible right ICA siphon is normal. Left carotid system: Negative aside from mild left ICA tortuosity in the neck. Negative visible left ICA siphon. Vertebral arteries: Mild mass effect on the proximal right subclavian artery due to the chest and neck soft tissue masses. No right subclavian stenosis. Normal right vertebral artery origin. Patent and normal right vertebral artery to the vertebrobasilar junction. Negative visible basilar artery. Negative proximal left subclavian artery and left vertebral artery origin. Mildly non dominant left vertebral artery is patent and negative to the vertebrobasilar junction. Other findings: Delayed venous phase images were also obtained. The right sigmoid sinus and right IJ bulb are patent at the skull base, but the right jugular vein is occluded beginning at the level of the right retromandibular vein, and continuing to the right subclavian/innominate vein confluence. The right innominate vein is occluded in the upper chest on series 19, image 113. The left innominate and left internal jugular vein remain patent. The left sigmoid and transverse sinus are patent. Review of the MIP images confirms the above findings IMPRESSION: 1. Bulky soft tissue masses in the right neck and continuing into the upper chest most compatible with extensive metastatic lymphadenopathy. Individual nodal masses up to 34 mm. 2. Associated thrombosis of the Right IJ, Right Innominate Vein, and likely also the SVC - See Chest CTA reported separately. 3. Mild retropharyngeal effusion likely related to #2. 4. No arterial abnormality in the neck. Electronically Signed   By: Genevie Ann M.D.   On: 01/20/2019 17:18   Ct Abdomen Pelvis W Contrast  Result Date: 01/21/2019 CLINICAL DATA:   New diagnosis of mediastinal mass. Evaluate for lymphoma or metastatic disease. EXAM: CT ABDOMEN AND PELVIS WITH CONTRAST TECHNIQUE: Multidetector CT imaging of the abdomen and pelvis was performed using the standard protocol following bolus administration of intravenous contrast. CONTRAST:  137mL OMNIPAQUE IOHEXOL 300 MG/ML  SOLN COMPARISON:  None. FINDINGS: Lower chest: The unremarkable. Hepatobiliary: Hyperenhancing focus in the anterior left liver likely related to anomalous venous anatomy/flow. Liver otherwise unremarkable. Probable layering sludge in the gallbladder lumen No intrahepatic or extrahepatic biliary dilation. Pancreas: No focal mass lesion. No dilatation of the main duct. No intraparenchymal cyst. No peripancreatic edema. Spleen: No splenomegaly. No focal mass lesion. Adrenals/Urinary Tract: No adrenal nodule or mass. 2 mm nonobstructing stone noted lower pole right kidney. 15 mm subcapsular lesion in the upper pole right kidney has heterogeneous enhancement. Left kidney unremarkable. Ureters not well visualized due to lack of retroperitoneal fat but no hydroureter  evident. The urinary bladder appears normal for the degree of distention. Stomach/Bowel: Stomach is unremarkable. No gastric wall thickening. No evidence of outlet obstruction. No small bowel or colonic dilatation. Vascular/Lymphatic: No abdominal aortic aneurysm. No discernible lymphadenopathy in the abdomen or pelvis. Reproductive: The prostate gland and seminal vesicles are unremarkable. Other: No intraperitoneal free fluid. Musculoskeletal: 11 mm sclerotic focus noted posterior left iliac bone. 10 mm sclerotic focus noted in the posterior right acetabulum. Increased attenuation in lower lumbar vertebral bodies likely enhancement related to opacified venous anatomy. IMPRESSION: 1. 15 mm heterogeneously enhancing lesion in the upper pole right kidney. Renal cell carcinoma a concern. MRI without and with contrast recommended to further  evaluate. 2. No lymphadenopathy in the abdomen or pelvis. 3. Focal hyperenhancement in the anterior left liver most likely related to aberrant venous anatomy/flow. 4. Sclerotic lesions in the right acetabulum and left iliac bone are indeterminate. Likely bone islands but attention on follow-up recommended as metastatic cannot be excluded. Electronically Signed   By: Misty Stanley M.D.   On: 01/21/2019 19:41   Ct Angio Chest Aorta W/cm &/or Wo/cm  Addendum Date: 01/20/2019   ADDENDUM REPORT: 01/20/2019 20:45 ADDENDUM: Clarification to the report. Thrombosis of the right jugular and brachiocephalic veins and probable subclavian vein. Confluence of these vessels is surrounded/occluded by the large mass. There is suspected tumor occlusion/thrombosis of the distal left brachiocephalic vein and upper SVC. Electronically Signed   By: Donavan Foil M.D.   On: 01/20/2019 20:45   Result Date: 01/20/2019 CLINICAL DATA:  Jugular vein thrombosis EXAM: CT ANGIOGRAPHY CHEST WITH CONTRAST TECHNIQUE: Multidetector CT imaging of the chest was performed using the standard protocol during bolus administration of intravenous contrast. Multiplanar CT image reconstructions and MIPs were obtained to evaluate the vascular anatomy. CONTRAST:  156mL OMNIPAQUE IOHEXOL 350 MG/ML SOLN COMPARISON:  Thyroid ultrasound 01/20/2019 FINDINGS: Cardiovascular: Satisfactory opacification of the pulmonary arteries to the segmental level. No evidence of pulmonary embolism. Nonaneurysmal aorta. No dissection. Normal heart size. No significant pericardial effusion. Occluded right jugular and probable subclavian veins. Narrowed appearance of the left brachiocephalic vein as it approaches the venous confluence. Narrowed and likely subtotal occlusion of the upper SVC. Patency of the SVC as it enters the right atrium. Severe narrowing of right upper lobe pulmonary arterial vessels. Numerous collateral vessels within the mediastinum. Mediastinum/Nodes:  Midline trachea.  No discrete thyroid nodule. 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 Mass measures approximately 6.2 cm AP by 5.6 cm transverse by 7.9 cm craniocaudad. It is contiguous with additional mass or nodes in the right hilar region. The mass encases/narrows the right upper lobe pulmonary vessels as well as the superior vena cava. Probable tumor invasion of the distal left brachiocephalic vessel and presumed tumor occlusion of the right brachiocephalic and jugular vessels. Partially visualized enlarged right supraclavicular nodes. Generalized edema within the soft tissues of the thoracic inlet and base of right neck. Fluid-filled esophagus with mild distal circumferential esophageal thickening. Lungs/Pleura: Emphysema. Presumed left greater than right apical scarring with left apical calcification. Posterior right upper lobe subpleural nodular focus of airspace disease measuring 13 mm, series 6, image number 55. Irregular focus of airspace disease, also within the right upper lobe abutting the fissure, series 6, image number 79. No pleural effusion or pneumothorax. Upper Abdomen: No acute abnormality. Musculoskeletal: No chest wall abnormality. No acute or significant osseous findings. Review of the MIP images confirms the above findings. IMPRESSION: 1. No acute pulmonary embolus or aortic  dissection. 2. Large poorly defined malignant-appearing infiltrative mass measuring approximately 6.2 x 5.6 x 7.9 cm within the right anterior and middle mediastinum. Suspected invasion of distal left brachiocephalic vein and probable tumor occlusion of the right subclavian and jugular veins. Suspected tumor invasion of the upper SVC with string like narrowing of the SVC, but with patency of the SVC just above the right atrium. Tumor abuts the aorta and great vessels and also results in significant narrowing of right upper lobe pulmonary arterial vessels.  Incompletely visualized right supraclavicular adenopathy. Right hilar nodes and or mass. 3. At least 2 foci of somewhat nodular appearing airspace disease in the right upper lobe, either representing pneumonia or foci of neoplasm. 4. Emphysema Emphysema (ICD10-J43.9). Electronically Signed: By: Donavan Foil M.D. On: 01/20/2019 17:41        Scheduled Meds: Continuous Infusions:  sodium chloride 10 mL/hr at 01/21/19 1836   piperacillin-tazobactam (ZOSYN)  IV 3.375 g (01/22/19 0157)     LOS: 2 days    Time spent: 38 minutes.     Hosie Poisson, MD [facility] Pager [phone]  If 7PM-7AM, please contact night-coverage [email or web address] Password [email or web address] 01/22/2019, 1:01 PM

## 2019-01-22 NOTE — Consult Note (Signed)
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 male presenting with lymphadenopathy. He states he devloped acute onset right neck swelling and presented to the Emergency Department. He is agitated, stating that "you all are all over-rated. They're telling me I have a mass in my chest when I don't." He states that he has had some mild dysphagia, neck masses, 20lb weight loss, some night sweats. Denies fevers, voice changes, ear pain, breathing trouble. He is admitted for mediastinal adenopathy, cervical adenopathy workup. On heparin gtt for right-sided IJ thrombosis. Current smoker prior to admission.    History reviewed. No pertinent past medical history.  Past Surgical History:  Procedure Laterality Date  . APPENDECTOMY     teenager  . INGUINAL HERNIA REPAIR Right 2016, 2017   [facility], 2018 [facility]-removed mesh    No family history on file.  Social History   Socioeconomic History  . Marital status: Single    Spouse name: Not on file  . Number of children: 1  . Years of education: GED  . Highest education level: Not on file  Occupational History    Comment: fork lift driver  Social Needs  . Financial resource strain: Not on file  . Food insecurity    Worry: Not on file    Inability: Not on file  . Transportation needs    Medical: Not on file    Non-medical: Not on file  Tobacco Use  . Smoking status: Current Every Day Smoker    Types: Cigars  . Smokeless tobacco: Never Used  . Tobacco comment: smokes 3 black and mild cigars per day  Substance and Sexual Activity  . Alcohol use: Yes  . Drug use: No  . Sexual activity: Yes  Lifestyle  . Physical activity    Days per week: Not on file    Minutes per session: Not on file  . Stress:  Not on file  Relationships  . Social Herbalist on phone: Not on file    Gets together: Not on file    Attends religious service: Not on file    Active member of club or organization: Not on file    Attends meetings of clubs or organizations: Not on file    Relationship status: Not on file  Other Topics Concern  . Not on file  Social History Narrative   Lives alone   Caffeine- coffee, 20 oz daily, tea occas    No current facility-administered medications on file prior to encounter.    Current Outpatient Medications on File Prior to Encounter  Medication Sig Dispense Refill  . ibuprofen (ADVIL,MOTRIN) 800 MG tablet Take 1 tablet (800 mg total) by mouth 3 (three) times daily. 21 tablet 0  . lidocaine (XYLOCAINE) 2 % solution Use as directed 15 mLs in the mouth or throat every 3 (three) hours as needed for mouth pain. 100 mL 0  . oxyCODONE-acetaminophen (PERCOCET/ROXICET) 5-325 MG tablet Take 1 tablet by mouth every 8 (eight) hours as needed for severe pain. 9 tablet 0    Allergies  Allergen Reactions  . Pregabalin Other (See Comments)    Homicidal idealizations, pain in lower extremities       Review of Systems: ROS complete and negative except for the above   OBJECTIVE: Vital Signs: Vitals:  01/21/19 2017 01/22/19 0550  BP: 99/71 (!) 94/57  Pulse: 87 84  Resp: 18   Temp: 98.4 F (36.9 C) 98.2 F (36.8 C)  SpO2: 98% 98%    I&O  Intake/Output Summary (Last 24 hours) at 01/22/2019 1010 Last data filed at 01/22/2019 0454 Gross per 24 hour  Intake 616.34 ml  Output 1300 ml  Net -683.66 ml    Physical Exam General: Well developed, well nourished. No acute distress. Voice without dysphonia.   Head/Face: Normocephalic, atraumatic. No scars or lesions. No sinus tenderness. Facial nerve intact and equal bilaterally.  No facial lacerations. Salivary glands non tender and without palpable masses  Eyes: Globes well positioned, no proptosis Lids: No periorbital  edema/ecchymosis. No lid laceration Conjunctiva: No chemosis, hemorrhage PERRL Extra occular movement: Full ROM bilaterally. No gaze restriction    Ears: No gross deformity. Normal external canal. Tympanic membrane intact bilaterally and without effusion  Hearing:  Normal speech reception.  Nose: No gross deformity or lesions. No purulent discharge. Septum midline. No turbinate hypertrophy.  Mouth/Oropharynx: Lips without any lesions. Dentition normal. No mucosal lesions within the oropharynx. No tonsillar enlargement, exudate, or lesions. Pharyngeal walls symmetrical. Uvula midline. Tongue midline without lesions.  Neck: Right sided vague fullness in level 3 and 4, no discretely palpable masses  Lymphatic: Thyroid normal on palpation  Respiratory: No stridor or distress. Normal voice  Cardiovascular: Regular rate and rhythm.  Extremities: No edema or cyanosis. Warm and well-perfused.  Skin: No scars or lesions on face or neck.  Neurologic: CN II-XII intact. Moving all extremities without gross abnormality.  Other:  Seems agitated, aggressive and somewhat combative    Labs: Lab Results  Component Value Date   WBC 6.7 01/22/2019   HGB 11.9 (L) 01/22/2019   HCT 37.1 (L) 01/22/2019   PLT 530 (H) 01/22/2019   NA 137 01/20/2019   K 4.4 01/20/2019   CL 103 01/20/2019   CREATININE 0.71 01/20/2019   BUN 8 01/20/2019   CO2 21 (L) 01/20/2019   TSH 2.020 01/20/2019   INR 1.2 01/20/2019     Review of Ancillary Data / Diagnostic Tests: CT angio chest personally reviewed- demonstrates vague adenopathy deep to SCM in the lower right neck, perhaps posterior to carotid, no discrete readily accessible LNs in the superficial neck.   I called and discussed this case with Neuroradiology. We are in agreement that the mediastinal LN, which is likely accessible transtracheally, is likely a better LN for biopsy access.   ASSESSMENT:  53 y.o. 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   RECOMMENDATIONS: Consider Pulmonology consultation vs IR for further tissue sampling of mediastinal mass Followup with me PRN    Gavin Pound, MD  [facility], [place] Office phone [phone]

## 2019-01-22 NOTE — Consult Note (Addendum)
NAME:  David Berry, MRN:  [id number], DOB:  Oct 22, 1965, LOS: 2 ADMISSION DATE:  01/20/2019, CONSULTATION DATE:  9/14 REFERRING MD:  Hosie Poisson , CHIEF COMPLAINT:  R arm pain  Brief History   53 yo male [place] significant for tobacco use admitted with RUE pain and neck swelling, found to have extensive thrombus of the R IJ, right innominate vein and the SVC and right anterior and middle mediastinum mass.   History of present illness   Approximately 2 weeks ago, patient developed RUE pain and was diagnosed with carpal tunnel syndrome. He was seen as outpatient and received a steroid injection at that time.  Since then he had progressive pain radiating down his right arm.  On 9/6, he noticed right sided neck swelling.  He started taking Ibuprofen for pain with minimal relief. He then tried a back/neck pain medication over the counter without relief.  Yesterday, he noticed the swelling had increased to both sides of his neck and he was having some difficulty swallowing.  He presented to the ED for evaluation of his symptoms.  Imaging revealed a thrombus of the right IJ, right innominate vein and the SVC, this was associated with a right anterior and middle mediastinum mass.  He does endorse blurriness in his peripheral vision, intermittent dizziness but no LOC or syncope.    Past Medical History  Tobacco use  Significant Hospital Events   Admit 9/13  Consults:  Cardiothoracic: 9/13 Interventional Radiology 9/14 CCM: 9/14  Procedures:   Significant Diagnostic Tests:  01/20/2019: CTA Chest: Bulky malignant-appearing anterior and middle mediastinal mass, 6.2 cm x5.6 cm x 7.9 cm 01/21/2019: CT Abdomen/Pelvis: Right renal pole lesion 01/21/19: ECHO:  Micro Data:  01/20/2019 SARS >> Negative  Antimicrobials:  01/21/2019 Zosyn >>  Interim history/subjective:  Alert and oriented, walking in room. Denies current pain or dyspnea.    Objective   Blood pressure (!) 94/57, pulse 84,  temperature 98.2 F (36.8 C), temperature source Oral, resp. rate 18, height 5\' 11"  (1.803 m), weight 52 kg, SpO2 98 %.        Intake/Output Summary (Last 24 hours) at 01/22/2019 1150 Last data filed at 01/22/2019 0900 Gross per 24 hour  Intake 616.34 ml  Output 1300 ml  Net -683.66 ml   Filed Weights   01/20/19 2000 01/20/19 2100  Weight: 52.2 kg 52 kg    Examination: General: Alert and ambulating in the room HENT: palpable mass to the right neck, tender to touch.   Lungs: CTA, symmetric expansion, no wheezing or rhonchi Cardiovascular: S1/S2, no rubs/murmur/gallops.  Warm and well perfused, no edema.  Abdomen: Non-distended, non-tender. Extremities: No deformities Neuro: Alert and oriented.   Moves all extremities equally.  No focal deficits  Resolved Hospital Problem list     Assessment & Plan:  Large Right Mediastinal and Neck Mass --Core biopsy of right neck/supraclavicular mass pending by IR --If unable to obtain sample, would proceed with bronchoscopy for biopsy.   --RTX and Chemo to start after biopsy obtained per primary team  Thrombosis of the right jugular, brachiocephalic, and subclavian vein --Heparin drip on hold for procedure  Tobacco use --Cessation counseling when appropriate  Best practice:  Diet: NPO for possible procedure Pain/Anxiety/Delirium protocol (if indicated): PRN tylenol VAP protocol (if indicated): N/A DVT prophylaxis: systemic Heparin GI prophylaxis: N/A Glucose control: Controlled Mobility: Mobilize as tolerated Code Status: FULL   Labs   CBC: Recent Labs  Lab 01/20/19 1157 01/21/19 0251 01/22/19 0800  WBC  9.0 7.4 6.7  NEUTROABS 6.0  --   --   HGB 12.0* 11.4* 11.9*  HCT 37.4* 35.6* 37.1*  MCV 85.0 84.0 85.1  PLT 382 509* 530*    Basic Metabolic Panel: Recent Labs  Lab 01/20/19 1157  NA 137  K 4.4  CL 103  CO2 21*  GLUCOSE 87  BUN 8  CREATININE 0.71  CALCIUM 10.0   GFR: Estimated Creatinine Clearance: 79.4  mL/min (by C-G formula based on SCr of 0.71 mg/dL). Recent Labs  Lab 01/20/19 1157 01/21/19 0251 01/22/19 0800  WBC 9.0 7.4 6.7    Liver Function Tests: No results for input(s): AST, ALT, ALKPHOS, BILITOT, PROT, ALBUMIN in the last 168 hours. No results for input(s): LIPASE, AMYLASE in the last 168 hours. No results for input(s): AMMONIA in the last 168 hours.  ABG No results found for: PHART, PCO2ART, PO2ART, HCO3, TCO2, ACIDBASEDEF, O2SAT   Coagulation Profile: Recent Labs  Lab 01/20/19 1357  INR 1.2    Cardiac Enzymes: No results for input(s): CKTOTAL, CKMB, CKMBINDEX, TROPONINI in the last 168 hours.  HbA1C: No results found for: HGBA1C  CBG: No results for input(s): GLUCAP in the last 168 hours.  Review of Systems:   Review of Systems .  HENT: Negative.   Eyes: Negative.   Respiratory: Negative Cardiovascular: +Chest pain Gastrointestinal: Nausea and vomiting approx 1 week ago, no further symtpoms Genitourinary: Negative.   Musculoskeletal: Right neck and arm pain  Neurological: No focal weakness.  + dizziness episodes, no loc or syndope Endo/Heme/Allergies: Negative.   Psychiatric/Behavioral: Negative Past Medical History  He,  has no past medical history on file.   Surgical History    Past Surgical History:  Procedure Laterality Date  . APPENDECTOMY     teenager  . INGUINAL HERNIA REPAIR Right 2016, 2017   [facility], [facility]-removed mesh     Social History   reports that he has been smoking cigars. He has never used smokeless tobacco. He reports current alcohol use. He reports that he does not use drugs.   Family History   His family history is not on file.   Allergies Allergies  Allergen Reactions  . Pregabalin Other (See Comments)    Homicidal idealizations, pain in lower extremities       Home Medications  Prior to Admission medications   Medication Sig Start Date End Date Taking? Authorizing Provider  ibuprofen (ADVIL)  200 MG tablet Take 800 mg by mouth every 6 (six) hours as needed for moderate pain.   Yes [provider]  ibuprofen (ADVIL,MOTRIN) 800 MG tablet Take 1 tablet (800 mg total) by mouth 3 (three) times daily. Patient not taking: Reported on 01/22/2019 05/05/18   Raylene Everts, MD  lidocaine (XYLOCAINE) 2 % solution Use as directed 15 mLs in the mouth or throat every 3 (three) hours as needed for mouth pain. Patient not taking: Reported on 01/22/2019 07/09/18   McDonald, Maree [place] A, PA-C  oxyCODONE-acetaminophen (PERCOCET/ROXICET) 5-325 MG tablet Take 1 tablet by mouth every 8 (eight) hours as needed for severe pain. Patient not taking: Reported on 01/22/2019 07/09/18   Joanne Gavel, PA-C     Critical care time:  Paulita Fujita, ACNP [facility]  Pager: [phone]      Mr. 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 He has been on  heparin since admission. He quit smoking 2 days ago. His father currently has "prostate cancer and bone cancer". Only previous surgeries are abdominal. He underwent IR transcutaneous biopsy this morning.   BP 108/89 (BP Location: Left Arm)   Pulse 72   Temp 97.7 F (36.5 C) (Oral)   Resp 18   Ht 5\' 11"  (1.803 m)   Wt 114 lb 10.2 oz (52 kg)   SpO2 100%   BMI 15.99 kg/m  Edema of the right neck and jaw, RUE No tracheal deviation CTAB RRR Normal speech, answering questions appropriately, moving all extremities spontaneously  CT reviewed- very large anterior and middle mediastinal mass with necrosis.  Assessment & Plan: Mediastinal mass; he would be a high risk intubation. Agree that IR biopsy was the safest way to quickly obtain a diagnosis rather than an EBUS. He understands my concern that this is most certainly a malignancy.  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 Con't anticoagulation; needs minimum 6 months of therapy, and likely AC for the duration of his malignancy.  Please call with questions.  Julian Hy, DO 01/22/19 5:38 PM [facility]

## 2019-01-22 NOTE — Procedures (Signed)
  Procedure: CT core ant mediast mass   EBL:   minimal Complications:  none immediate  See full dictation in [facility].  Dillard Cannon MD Main # [phone] Pager  [phone]

## 2019-01-22 NOTE — H&P (Signed)
Chief Complaint: Right neck and mediastinal mass   Referring Physician(s): Hosie Poisson  Supervising Physician: Arne Cleveland  Patient Status: [facility] - In-pt  History of Present Illness: David Berry is a 53 y.o. male who initially saw an orthopedic provider after a right hand injury on the job.  He c/o right arm numbness.  He was diagnosed with carpal tunnel syndrome and received an injection in August.  The  numbness progressively got worse and radiated into his chest.  A week ago he developed new right-sided neck swelling so he decided to come into the emergency department.  He reported he felt more constriction around his neck.  He denies any issues with dysphasia or trouble breathing.   CTA neck revealed a bulky soft tissue mass in the right neck continuing into the upper chest most compatible with extensive metastatic lymphadenopathy.  We are asked to perform a biopsy.  He is NPO. His heparin drip has been stopped.  History reviewed. No pertinent past medical history.  Past Surgical History:  Procedure Laterality Date   APPENDECTOMY     teenager   INGUINAL HERNIA REPAIR Right 2016, 2017   [facility], 2018 [facility]-removed mesh    Allergies: Pregabalin  Medications: Prior to Admission medications   Medication Sig Start Date End Date Taking? Authorizing Provider  ibuprofen (ADVIL) 200 MG tablet Take 800 mg by mouth every 6 (six) hours as needed for moderate pain.   Yes [provider]  ibuprofen (ADVIL,MOTRIN) 800 MG tablet Take 1 tablet (800 mg total) by mouth 3 (three) times daily. Patient not taking: Reported on 01/22/2019 05/05/18   Raylene Everts, MD  lidocaine (XYLOCAINE) 2 % solution Use as directed 15 mLs in the mouth or throat every 3 (three) hours as needed for mouth pain. Patient not taking: Reported on 01/22/2019 07/09/18   McDonald, Maree Erie A, PA-C  oxyCODONE-acetaminophen (PERCOCET/ROXICET) 5-325 MG tablet Take 1 tablet by  mouth every 8 (eight) hours as needed for severe pain. Patient not taking: Reported on 01/22/2019 07/09/18   McDonald, Mia A, PA-C     No family history on file.  Social History   Socioeconomic History   Marital status: Single    Spouse name: Not on file   Number of children: 1   Years of education: GED   Highest education level: Not on file  Occupational History    Comment: fork lift driver  Social Needs   Financial resource strain: Not on file   Food insecurity    Worry: Not on file    Inability: Not on file   Transportation needs    Medical: Not on file    Non-medical: Not on file  Tobacco Use   Smoking status: Current Every Day Smoker    Types: Cigars   Smokeless tobacco: Never Used   Tobacco comment: smokes 3 black and mild cigars per day  Substance and Sexual Activity   Alcohol use: Yes   Drug use: No   Sexual activity: Yes  Lifestyle   Physical activity    Days per week: Not on file    Minutes per session: Not on file   Stress: Not on file  Relationships   Social connections    Talks on phone: Not on file    Gets together: Not on file    Attends religious service: Not on file    Active member of club or organization: Not on file    Attends meetings of clubs  or organizations: Not on file    Relationship status: Not on file  Other Topics Concern   Not on file  Social History Narrative   Lives alone   Caffeine- coffee, 20 oz daily, tea occas     Review of Systems: A 12 point ROS discussed and pertinent positives are indicated in the HPI above.  All other systems are negative.  Review of Systems  Vital Signs: BP (!) 94/57 (BP Location: Left Arm)    Pulse 84    Temp 98.2 F (36.8 C) (Oral)    Resp 18    Ht 5\' 11"  (1.803 m)    Wt 52 kg    SpO2 98%    BMI 15.99 kg/m   Physical Exam Vitals signs reviewed.  Constitutional:      Appearance: Normal appearance.  HENT:     Head: Normocephalic and atraumatic.  Eyes:     Extraocular  Movements: Extraocular movements intact.  Neck:     Comments: Palpable mass right neck Cardiovascular:     Rate and Rhythm: Normal rate and regular rhythm.  Pulmonary:     Effort: Pulmonary effort is normal.     Breath sounds: Normal breath sounds.  Abdominal:     General: There is no distension.     Palpations: Abdomen is soft.  Musculoskeletal: Normal range of motion.  Skin:    General: Skin is warm and dry.  Neurological:     General: No focal deficit present.     Mental Status: He is alert and oriented to person, place, and time.  Psychiatric:        Mood and Affect: Mood normal.        Behavior: Behavior normal.        Thought Content: Thought content normal.        Judgment: Judgment normal.     Imaging: Ct Angio Neck W And/or Wo Contrast  Result Date: 01/20/2019 CLINICAL DATA:  53 year old male with unexplained neck swelling for 3 months. Evidence of right IJ occlusion on thyroid ultrasound earlier today. EXAM: CT ANGIOGRAPHY NECK TECHNIQUE: Multidetector CT imaging of the neck was performed using the standard protocol during bolus administration of intravenous contrast. Multiplanar CT image reconstructions and MIPs were obtained to evaluate the vascular anatomy. Carotid stenosis measurements (when applicable) are obtained utilizing NASCET criteria, using the distal internal carotid diameter as the denominator. CONTRAST:  170mL OMNIPAQUE IOHEXOL 350 MG/ML SOLN COMPARISON:  CTA chest reported separately. Thyroid ultrasound earlier today. FINDINGS: Skeleton: No acute or suspicious osseous lesion from the skull base to the upper chest. Mucous retention cyst in the left maxillary sinus. Mild right mastoid effusion. Upper chest: Mediastinal mass/lymphadenopathy, reported separately today. Other neck: Bulky but indistinct soft tissue masses tracking cephalad in the right neck from the thoracic inlet. Involvement of the right level 4, right level 5, right level 3 and right level 2 nodal  stations. Superimposed subcutaneous edema in the region. Estimated individual lymph nodes/soft tissue mass size up to 34 millimeters long axis, 23 millimeters short axis. The level 1 nodal station seems to be spared. The left neck appears relatively spared. Mild leftward mass effect on the trachea and pharynx. The thyroid and pharyngeal soft tissue contours remain within normal limits. The glottis is closed. The parapharyngeal spaces are normal. And superior retropharyngeal space there is edema/effusion in the lower retropharyngeal space. The sublingual, submandibular and parotid spaces appear spared. Grossly negative visible brain parenchyma. Visualized orbit soft tissues are within normal limits. Aortic  arch: 3 vessel arch configuration.  No arch atherosclerosis. Right carotid system: Mass effect on the brachiocephalic artery and to a lesser extent the right carotid space from the chest and neck soft tissue masses. No associated arterial stenosis. Negative right carotid bifurcation and cervical right ICA. Visible right ICA siphon is normal. Left carotid system: Negative aside from mild left ICA tortuosity in the neck. Negative visible left ICA siphon. Vertebral arteries: Mild mass effect on the proximal right subclavian artery due to the chest and neck soft tissue masses. No right subclavian stenosis. Normal right vertebral artery origin. Patent and normal right vertebral artery to the vertebrobasilar junction. Negative visible basilar artery. Negative proximal left subclavian artery and left vertebral artery origin. Mildly non dominant left vertebral artery is patent and negative to the vertebrobasilar junction. Other findings: Delayed venous phase images were also obtained. The right sigmoid sinus and right IJ bulb are patent at the skull base, but the right jugular vein is occluded beginning at the level of the right retromandibular vein, and continuing to the right subclavian/innominate vein confluence. The  right innominate vein is occluded in the upper chest on series 19, image 113. The left innominate and left internal jugular vein remain patent. The left sigmoid and transverse sinus are patent. Review of the MIP images confirms the above findings IMPRESSION: 1. Bulky soft tissue masses in the right neck and continuing into the upper chest most compatible with extensive metastatic lymphadenopathy. Individual nodal masses up to 34 mm. 2. Associated thrombosis of the Right IJ, Right Innominate Vein, and likely also the SVC - See Chest CTA reported separately. 3. Mild retropharyngeal effusion likely related to #2. 4. No arterial abnormality in the neck. Electronically Signed   By: Genevie Ann M.D.   On: 01/20/2019 17:18   Ct Abdomen Pelvis W Contrast  Result Date: 01/21/2019 CLINICAL DATA:  New diagnosis of mediastinal mass. Evaluate for lymphoma or metastatic disease. EXAM: CT ABDOMEN AND PELVIS WITH CONTRAST TECHNIQUE: Multidetector CT imaging of the abdomen and pelvis was performed using the standard protocol following bolus administration of intravenous contrast. CONTRAST:  173mL OMNIPAQUE IOHEXOL 300 MG/ML  SOLN COMPARISON:  None. FINDINGS: Lower chest: The unremarkable. Hepatobiliary: Hyperenhancing focus in the anterior left liver likely related to anomalous venous anatomy/flow. Liver otherwise unremarkable. Probable layering sludge in the gallbladder lumen No intrahepatic or extrahepatic biliary dilation. Pancreas: No focal mass lesion. No dilatation of the main duct. No intraparenchymal cyst. No peripancreatic edema. Spleen: No splenomegaly. No focal mass lesion. Adrenals/Urinary Tract: No adrenal nodule or mass. 2 mm nonobstructing stone noted lower pole right kidney. 15 mm subcapsular lesion in the upper pole right kidney has heterogeneous enhancement. Left kidney unremarkable. Ureters not well visualized due to lack of retroperitoneal fat but no hydroureter evident. The urinary bladder appears normal for the  degree of distention. Stomach/Bowel: Stomach is unremarkable. No gastric wall thickening. No evidence of outlet obstruction. No small bowel or colonic dilatation. Vascular/Lymphatic: No abdominal aortic aneurysm. No discernible lymphadenopathy in the abdomen or pelvis. Reproductive: The prostate gland and seminal vesicles are unremarkable. Other: No intraperitoneal free fluid. Musculoskeletal: 11 mm sclerotic focus noted posterior left iliac bone. 10 mm sclerotic focus noted in the posterior right acetabulum. Increased attenuation in lower lumbar vertebral bodies likely enhancement related to opacified venous anatomy. IMPRESSION: 1. 15 mm heterogeneously enhancing lesion in the upper pole right kidney. Renal cell carcinoma a concern. MRI without and with contrast recommended to further evaluate. 2. No lymphadenopathy in the  abdomen or pelvis. 3. Focal hyperenhancement in the anterior left liver most likely related to aberrant venous anatomy/flow. 4. Sclerotic lesions in the right acetabulum and left iliac bone are indeterminate. Likely bone islands but attention on follow-up recommended as metastatic cannot be excluded. Electronically Signed   By: Misty Stanley M.D.   On: 01/21/2019 19:41   Ct Angio Chest Aorta W/cm &/or Wo/cm  Addendum Date: 01/20/2019   ADDENDUM REPORT: 01/20/2019 20:45 ADDENDUM: Clarification to the report. Thrombosis of the right jugular and brachiocephalic veins and probable subclavian vein. Confluence of these vessels is surrounded/occluded by the large mass. There is suspected tumor occlusion/thrombosis of the distal left brachiocephalic vein and upper SVC. Electronically Signed   By: Donavan Foil M.D.   On: 01/20/2019 20:45   Result Date: 01/20/2019 CLINICAL DATA:  Jugular vein thrombosis EXAM: CT ANGIOGRAPHY CHEST WITH CONTRAST TECHNIQUE: Multidetector CT imaging of the chest was performed using the standard protocol during bolus administration of intravenous contrast. Multiplanar CT  image reconstructions and MIPs were obtained to evaluate the vascular anatomy. CONTRAST:  17mL OMNIPAQUE IOHEXOL 350 MG/ML SOLN COMPARISON:  Thyroid ultrasound 01/20/2019 FINDINGS: Cardiovascular: Satisfactory opacification of the pulmonary arteries to the segmental level. No evidence of pulmonary embolism. Nonaneurysmal aorta. No dissection. Normal heart size. No significant pericardial effusion. Occluded right jugular and probable subclavian veins. Narrowed appearance of the left brachiocephalic vein as it approaches the venous confluence. Narrowed and likely subtotal occlusion of the upper SVC. Patency of the SVC as it enters the right atrium. Severe narrowing of right upper lobe pulmonary arterial vessels. Numerous collateral vessels within the mediastinum. Mediastinum/Nodes: Midline trachea.  No discrete thyroid nodule. 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 Mass measures approximately 6.2 cm AP by 5.6 cm transverse by 7.9 cm craniocaudad. It is contiguous with additional mass or nodes in the right hilar region. The mass encases/narrows the right upper lobe pulmonary vessels as well as the superior vena cava. Probable tumor invasion of the distal left brachiocephalic vessel and presumed tumor occlusion of the right brachiocephalic and jugular vessels. Partially visualized enlarged right supraclavicular nodes. Generalized edema within the soft tissues of the thoracic inlet and base of right neck. Fluid-filled esophagus with mild distal circumferential esophageal thickening. Lungs/Pleura: Emphysema. Presumed left greater than right apical scarring with left apical calcification. Posterior right upper lobe subpleural nodular focus of airspace disease measuring 13 mm, series 6, image number 55. Irregular focus of airspace disease, also within the right upper lobe abutting the fissure, series 6, image number 79. No pleural effusion or pneumothorax. Upper  Abdomen: No acute abnormality. Musculoskeletal: No chest wall abnormality. No acute or significant osseous findings. Review of the MIP images confirms the above findings. IMPRESSION: 1. No acute pulmonary embolus or aortic dissection. 2. Large poorly defined malignant-appearing infiltrative mass measuring approximately 6.2 x 5.6 x 7.9 cm within the right anterior and middle mediastinum. Suspected invasion of distal left brachiocephalic vein and probable tumor occlusion of the right subclavian and jugular veins. Suspected tumor invasion of the upper SVC with string like narrowing of the SVC, but with patency of the SVC just above the right atrium. Tumor abuts the aorta and great vessels and also results in significant narrowing of right upper lobe pulmonary arterial vessels. Incompletely visualized right supraclavicular adenopathy. Right hilar nodes and or mass. 3. At least 2 foci of somewhat nodular appearing airspace disease in the right upper lobe, either representing pneumonia or foci of neoplasm. 4. Emphysema Emphysema (  ICD10-J43.9). Electronically Signed: By: Donavan Foil M.D. On: 01/20/2019 17:41   US Thyroid  Result Date: 01/20/2019 CLINICAL DATA:  Goiter, neck swelling x3 months EXAM: THYROID ULTRASOUND TECHNIQUE: Ultrasound examination of the thyroid gland and adjacent soft tissues was performed. COMPARISON:  None. FINDINGS: Parenchymal Echotexture: Mildly heterogenous Isthmus: 0.3 cm thickness Right lobe: 6 x 2.1 x 2.3 cm Left lobe: 6 x 2 x 1.8 cm _________________________________________________________ Estimated total number of nodules >/= 1 cm: 0 Number of spongiform nodules >/=  2 cm not described below (TR1): 0 Number of mixed cystic and solid nodules >/= 1.5 cm not described below (TR2): 0 _________________________________________________________ No discrete nodules are seen within the thyroid gland. Suspected echogenic thrombus occluding the right IJ vein. IMPRESSION: 1. Thyromegaly without  nodule. 2. 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 The above is in keeping with the ACR TI-RADS recommendations - J Am Coll Radiol 2017;14:587-595. Electronically Signed   By: Lucrezia Europe M.D.   On: 01/20/2019 13:27    Labs:  CBC: Recent Labs    01/20/19 1157 01/21/19 0251 01/22/19 0800  WBC 9.0 7.4 6.7  HGB 12.0* 11.4* 11.9*  HCT 37.4* 35.6* 37.1*  PLT 382 509* 530*    COAGS: Recent Labs    01/20/19 1357  INR 1.2  APTT 30    BMP: Recent Labs    01/20/19 1157  NA 137  K 4.4  CL 103  CO2 21*  GLUCOSE 87  BUN 8  CALCIUM 10.0  CREATININE 0.71  GFRNONAA >60  GFRAA >60    LIVER FUNCTION TESTS: No results for input(s): BILITOT, AST, ALT, ALKPHOS, PROT, ALBUMIN in the last 8760 hours.  TUMOR MARKERS: No results for input(s): AFPTM, CEA, CA199, CHROMGRNA in the last 8760 hours.  Assessment and Plan:  Bulky soft tissue mass in the right neck continuing into the upper chest most compatible with extensive metastatic lymphadenopathy.  Will proceed with image guided biopsy today by Dr. Vernard Gambles.  Risks and benefits of right neck mass biopsy was discussed with the patient and/or patient's family including, but not limited to bleeding, infection, damage to adjacent structures or low yield requiring additional tests.  All of the questions were answered and there is agreement to proceed.  Consent signed and in chart.  Thank you for this interesting consult.  I greatly enjoyed meeting Jen Eppinger and look forward to participating in their care.  A copy of this report was sent to the requesting provider on this date.  Electronically Signed: Murrell Redden, PA-C   01/22/2019, 11:57 AM      I spent a total of 40 Minutes  in face to face in clinical consultation, greater than 50% of which was counseling/coordinating care for right  Neck mass biopsy.

## 2019-01-22 NOTE — Progress Notes (Signed)
SLP Cancellation Note  Patient Details Name: David Berry MRN: [id number] DOB: May 02, 1966   Cancelled treatment:       Reason Eval/Treat Not Completed: Medical issues which prohibited therapy - per chart, pt appears to be NPO pending procedure. Will f/u as able for swallow evaluation.   Venita Sheffield [name] 01/22/2019, 7:18 AM  Pollyann Glen, M.A. [place] Acute Environmental education officer [phone] Office [phone]

## 2019-01-22 NOTE — Plan of Care (Signed)
  Problem: Pain Managment: Goal: General experience of comfort will improve Outcome: Progressing   

## 2019-01-22 NOTE — Consult Note (Signed)
I spoke with Dr. Vernard Gambles from radiology, who is planning on doing transcutaneous core biopsies of the mediastinal mass today.  This would be a safer approach than having to intubate and sedate him for an EBUS.   Julian Hy, DO 01/22/19 12:29 PM [facility]

## 2019-01-23 ENCOUNTER — Inpatient Hospital Stay (HOSPITAL_COMMUNITY): Payer: Medicaid Other

## 2019-01-23 ENCOUNTER — Ambulatory Visit: Payer: 59 | Admitting: Orthopaedic Surgery

## 2019-01-23 LAB — HEPARIN LEVEL (UNFRACTIONATED)
Heparin Unfractionated: 0.13 IU/mL — ABNORMAL LOW (ref 0.30–0.70)
Heparin Unfractionated: 0.33 IU/mL (ref 0.30–0.70)
Heparin Unfractionated: 0.37 IU/mL (ref 0.30–0.70)

## 2019-01-23 LAB — CBC
HCT: 34.7 % — ABNORMAL LOW (ref 39.0–52.0)
Hemoglobin: 11.3 g/dL — ABNORMAL LOW (ref 13.0–17.0)
MCH: 27.2 pg (ref 26.0–34.0)
MCHC: 32.6 g/dL (ref 30.0–36.0)
MCV: 83.6 fL (ref 80.0–100.0)
Platelets: 523 10*3/uL — ABNORMAL HIGH (ref 150–400)
RBC: 4.15 MIL/uL — ABNORMAL LOW (ref 4.22–5.81)
RDW: 14.5 % (ref 11.5–15.5)
WBC: 5.3 10*3/uL (ref 4.0–10.5)
nRBC: 0 % (ref 0.0–0.2)

## 2019-01-23 IMAGING — MR MR HEAD WO/W CM
6 of 10 series · 28 of 48 positions shown · IV contrast (gadavist)
Comparison: CT angiogram neck [DATE]

CLINICAL DATA: Renal cell cancer, staging.

EXAM:
MRI HEAD WITHOUT AND WITH CONTRAST
TECHNIQUE: Multiplanar, multiecho pulse sequences of the brain and surrounding
structures were obtained without and with intravenous contrast.
CONTRAST:  5mL GADAVIST GADOBUTROL 1 MMOL/ML IV SOLN

[Series 3: DWI · axial · 3.0mm · 1.09mm/px · z∈[-49,+81]mm · 9 of 90 slices shown (1 of 2)]
[im 1/90]
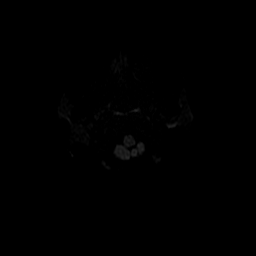
[im 12/90]
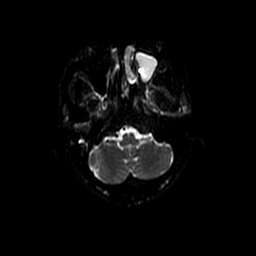
[im 23/90]
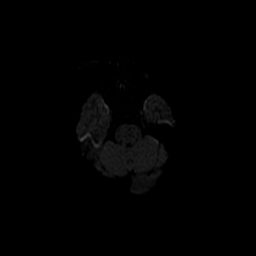
[im 34/90]
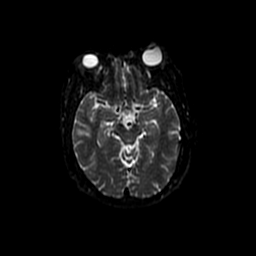
[im 45/90]
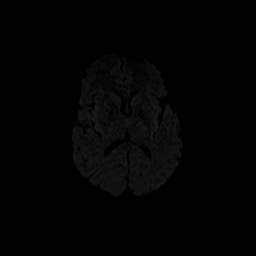
[im 56/90]
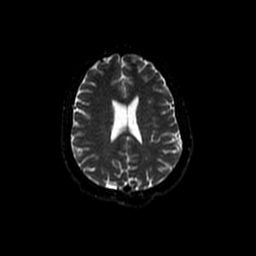
[im 67/90]
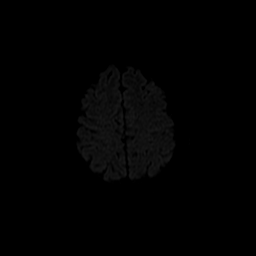
[im 78/90]
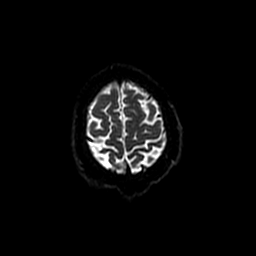
[im 90/90]
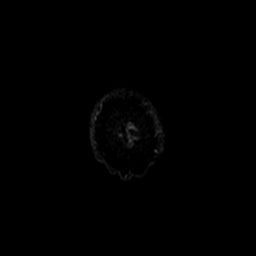

[Series 5: T2 · axial · 5.0mm · 0.45mm/px · z∈[-51,+13]mm · 2 of 24 slices shown]
[im 1/24]
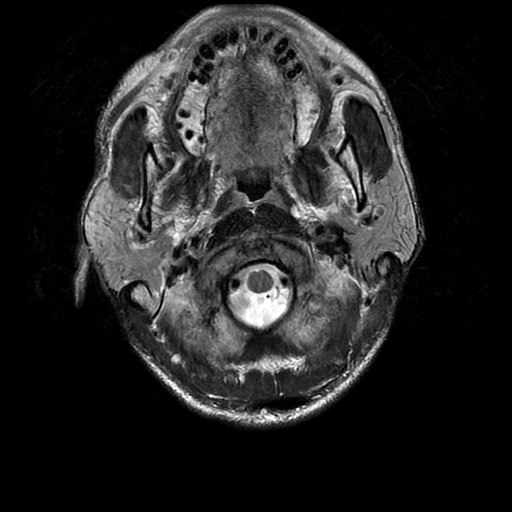
[im 12/24]
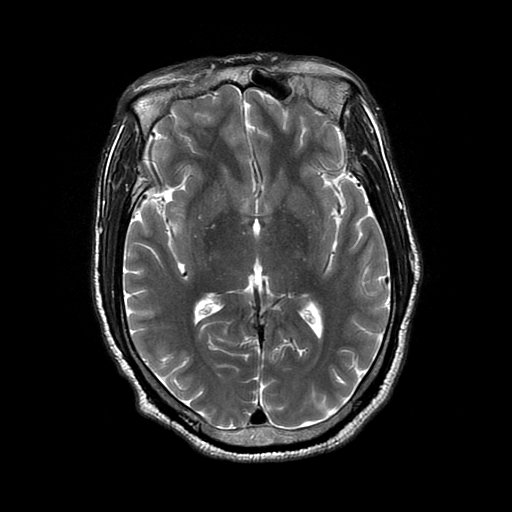

[Series 6: FLAIR · axial · 3.0mm · 0.43mm/px · z∈[-50,+84]mm · 3 of 24 slices shown]
[im 1/24]
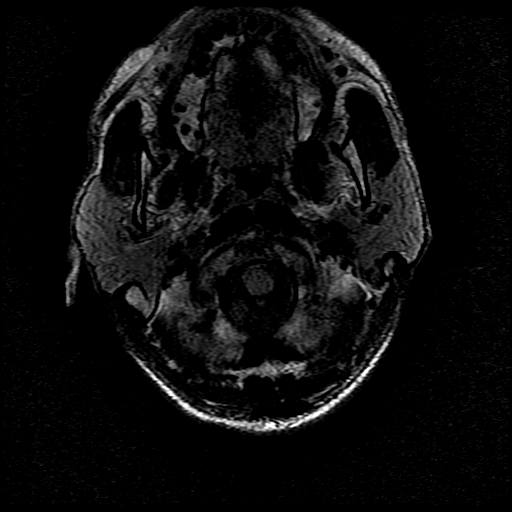
[im 12/24]
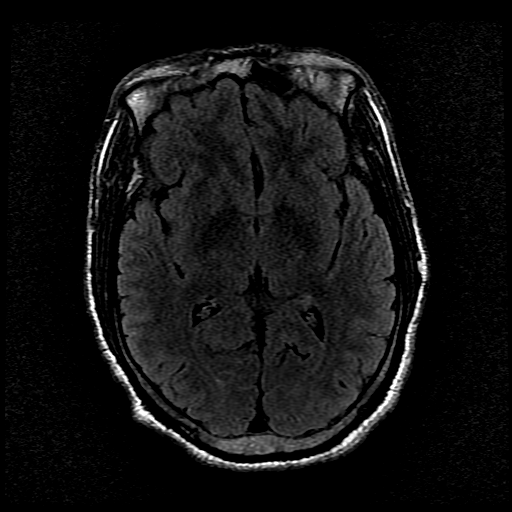
[im 24/24]
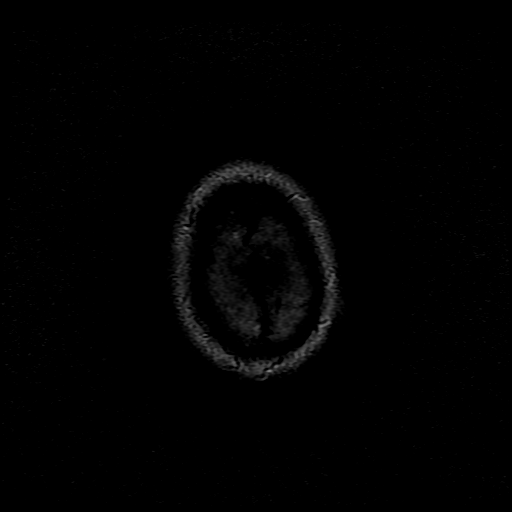

[Series 10: T1 post-contrast · axial · 3.0mm · 0.47mm/px · z∈[-60,+83]mm · 6 of 50 slices shown (1 of 2)]
[im 1/50]
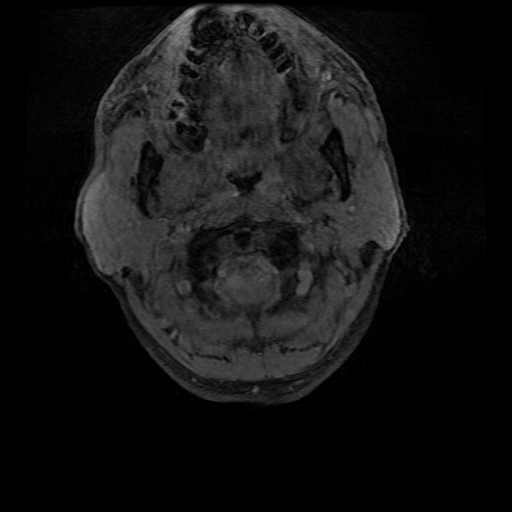
[im 10/50]
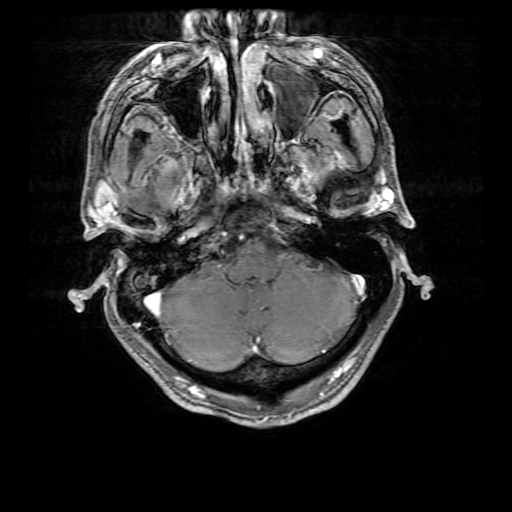
[im 20/50]
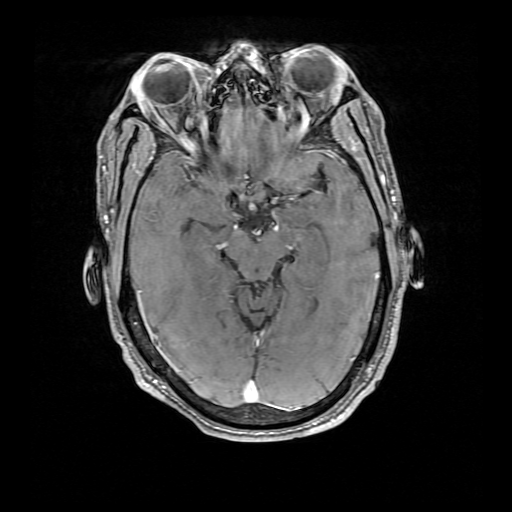
[im 30/50]
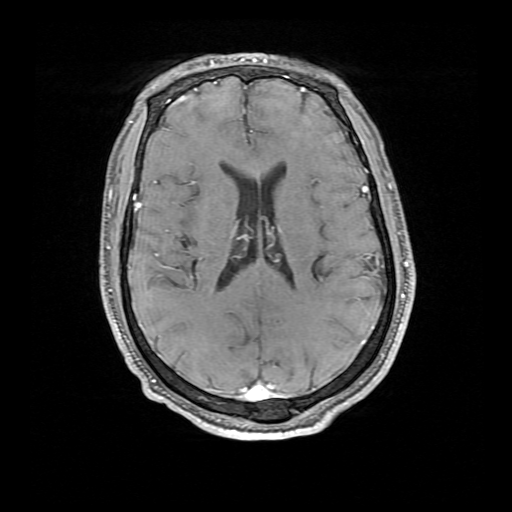
[im 40/50]
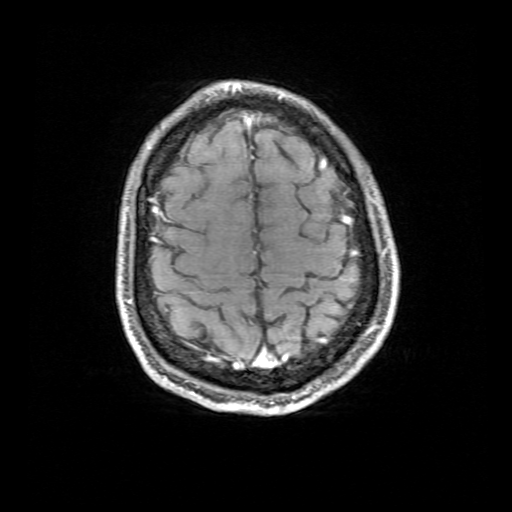
[im 50/50]
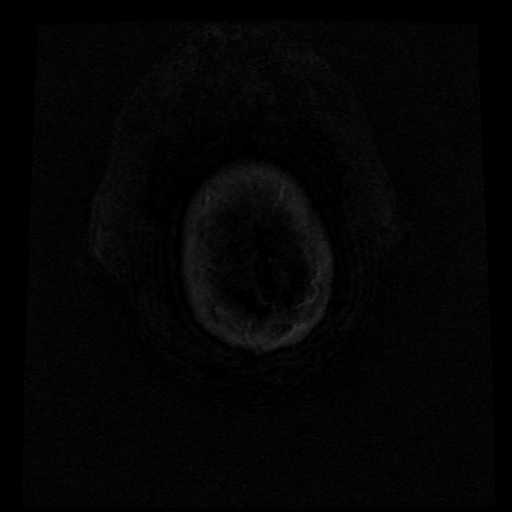

[Series 11: T1 post-contrast · coronal · 5.0mm · 0.39mm/px · 3 of 25 slices shown (2 of 2)]
[im 1/25]
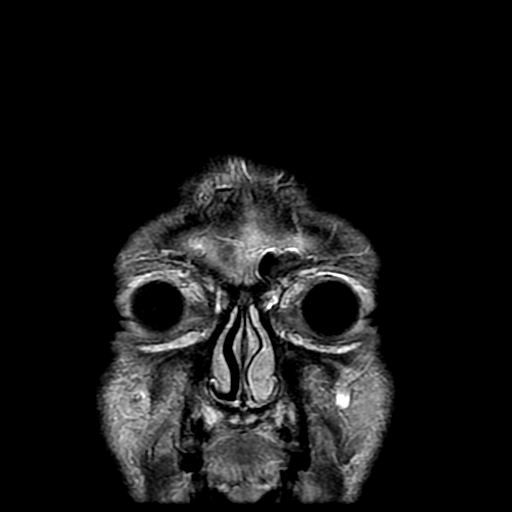
[im 13/25]
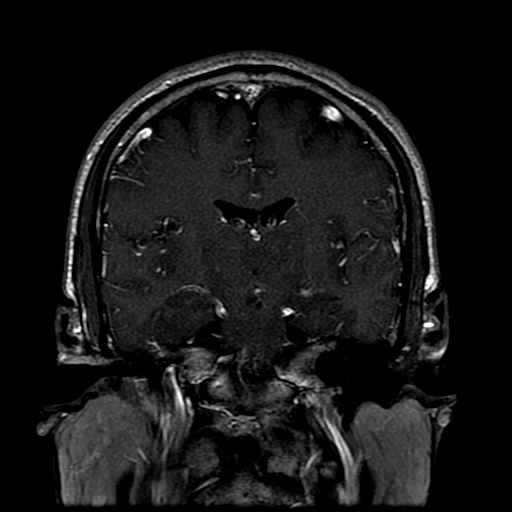
[im 25/25]
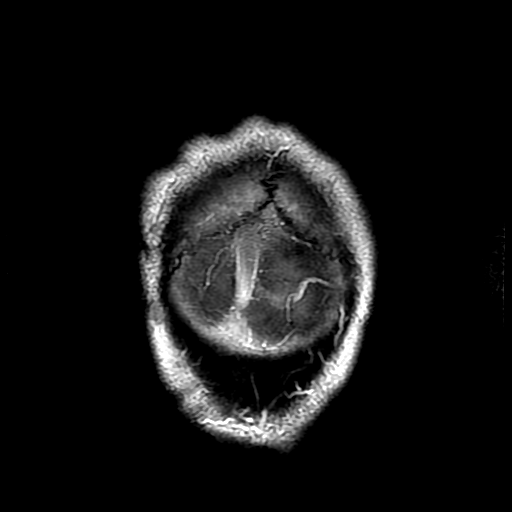

[Series 300: DWI · axial · 3.0mm · 1.09mm/px · z∈[-49,+81]mm · 5 of 45 slices shown (2 of 2)]
[im 1/45]
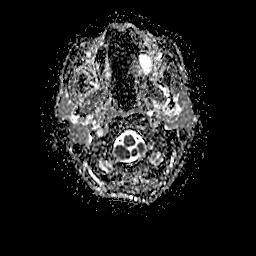
[im 12/45]
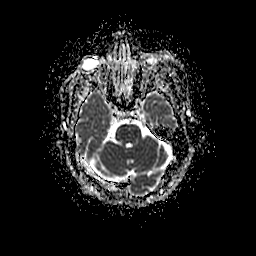
[im 23/45]
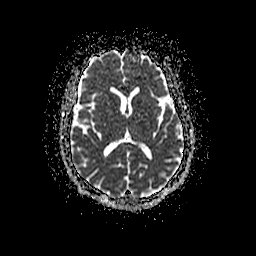
[im 34/45]
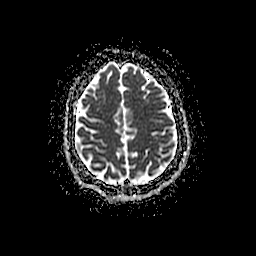
[im 45/45]
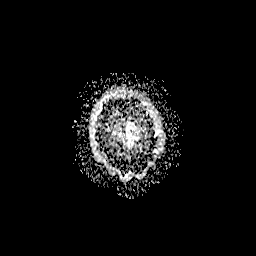

[28 of 48 positions shown; findings below may reference images not displayed]

FINDINGS: Brain: Multiple sequences are significantly motion degraded,
limiting evaluation. This includes mild motion degradation of
postcontrast imaging. No abnormal intracranial enhancement is
identified to suggest intracranial metastatic disease. No evidence
of acute infarct. No evidence of intracranial mass. No midline shift
or extra-axial fluid collection. No chronic intracranial hemorrhage.
A few scattered punctate foci of T2/FLAIR hyperintensity within the
cerebral white matter are nonspecific, but may reflect minimal
chronic small vessel ischemic disease. Cerebral volume is normal for
age.

Vascular: Flow voids maintained within the proximal large arterial
vessels.

Skull and upper cervical spine: Normal marrow signal.

Sinuses/Orbits: Imaged globes and orbits demonstrate no acute
abnormality. Complete T2 hyperintense opacification of the left
maxillary sinus. Mild scattered mucosal thickening within the
remainder of the paranasal sinuses. Right mastoid effusion.
IMPRESSION: Motion degraded examination.

No evidence of intracranial metastatic disease.

A few scattered punctate signal changes within the cerebral white
matter are nonspecific, but may reflect minimal chronic small vessel
ischemic disease.

Complete T2 hyperintense opacification of the left maxillary sinus,
nonspecific but possibly reflecting fluid or a large mucous
retention cyst.

Right mastoid effusion.

## 2019-01-23 MED ORDER — GADOBUTROL 1 MMOL/ML IV SOLN
5.0000 mL | Freq: Once | INTRAVENOUS | Status: AC | PRN
  Administered 2019-01-23: 5 mL via INTRAVENOUS

## 2019-01-23 NOTE — Progress Notes (Addendum)
PROGRESS NOTE    David Berry  AYT:[id number] DOB: 08/21/1965 DOA: 01/20/2019 PCP: Patient, No Pcp Per    Brief Narrative:  53 year old gentleman with no prior medical history presents to ED for right upper extremity pain and right sided neck mass and swelling. CT angiogram of the neck with and without contrast showed Bulky soft tissue masses in the right neck and continuing into the upper chest most compatible with extensive metastatic lymphadenopathy. Individual nodal masses up to 34 mm. 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   CT angiogram of the chest with contrast showed Large poorly defined malignant-appearing infiltrative mass measuring approximately 6.2 x 5.6 x 7.9 cm within the right anterior and middle mediastinum. Suspected invasion of distal left brachiocephalic vein and probable tumor occlusion of the right subclavian and jugular veins. Suspected tumor invasion of the upper SVC with string like narrowing of the SVC, but with patency of the SVC just above the right atrium.  Thrombosis of the right jugular and brachiocephalic veins and probable subclavian vein.There is suspected tumor occlusion/thrombosis of the distal left brachiocephalic vein and upper SVC. CT surgery were consulted by EDP and recommendations given.  Due to the nature of the metastatic lymphadenopathy oncology Dr Alvy Bimler consulted who recommended to get ENT for excisional biopsy and echocardiogram.  Discussed with Dr. Janace Hoard requested to call in the morning for the consult. Requested Dr. Glenetta Borg with ENT for an excisional biopsy , defered to IR for a transcutaneous biopsy of the mediastinal mass. Meanwhile PCCM also consulted to see if he needs a bronchoscopy with transtracheal biopsy. IR  did  image guided biopsy via transcutaneous needs of the anterior mediastinal mass on 01/22/2019 and we are currently waiting for the results.  Dr Alvy Bimler with oncology  recommended to call on call hematologist once the biopsy results are back.  We will probably need to transfer the patient to [facility] after the biopsy is done to initiate the RTX and chemotherapy if lymphoma is confirmed.  Assessment & Plan:   Active Problems:   Neck mass   Mass of thoracic structure   Thrombus   Extravasation injury of IV catheter site with other complication (HCC)   Skin rash   Anterior mediastinal mass and right neck mass Underwent  IR guided biopsy of the anterior mediastinal mass.  Follow the results and transfer the patient to [facility], once results are back to see if its lymphoma for Xrt. Pain control. Meanwhile CT abd and pelvis showed 15 mm heterogeneously enhancing lesion in the upper pole right kidney. Renal cell carcinoma a concern.  Follow the results of the biopsy and discuss with hematology/ oncology for follow up MRI of the abdomen with and without contrast for evaluation of the renal mass.    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 Started on IV heparin, continue the same for now.    ? Post obstructive pneumonia:  On IV zosyn empirically.    Neck pain, headache :  MRI of the brain with and without contrast ruled out metastatic disease.   Mild normocytic anemia.  Anemia panel will be sent.  DVT prophylaxis: heparin.  Code Status: full code.  Family Communication: discussed with family at bedside and over the phone.  Disposition Plan: pending further work up.    Consultants:   Dr Cyndia Bent with CT surgery.   ENT   IR  Oncology Dr Alvy Bimler.   PCCM.   Procedures:  IR guided biopsy of the neck mass on 01/22/2019  Antimicrobials: IV zosyn. Since admission.   Subjective: Appears in good spirits today. Pain controlled agreeable to wait for the results and the plan above.   Objective: Vitals:   01/22/19 1604 01/22/19 2050 01/23/19 0436 01/23/19 1445  BP: 108/89 102/87 93/65 (!) 108/59  Pulse: 72 84 98 75    Resp: 18 17 18 16   Temp: 97.7 F (36.5 C) 98.2 F (36.8 C) 98.6 F (37 C) 98.6 F (37 C)  TempSrc: Oral Oral Oral Oral  SpO2: 100% 100% 98% 100%  Weight:      Height:        Intake/Output Summary (Last 24 hours) at 01/23/2019 1454 Last data filed at 01/23/2019 1000 Gross per 24 hour  Intake 240 ml  Output --  Net 240 ml   Filed Weights   01/20/19 2000 01/20/19 2100  Weight: 52.2 kg 52 kg    Examination:  General exam: Alert and comfortable not in any kind of distress Respiratory system: Diminished at bases, no wheezing or rhonchi, air entry fair Cardiovascular system: S1-S2 heard, regular rate rhythm, no pedal edema Gastrointestinal system: Abdomen is soft, nontender, nondistended, bowel sounds are good Central nervous system: Alert and grossly nonfocal Extremities: No pedal edema cyanosis or clubbing Psychiatry: Mood is appropriate    Data Reviewed: I have personally reviewed following labs and imaging studies  CBC: Recent Labs  Lab 01/20/19 1157 01/21/19 0251 01/22/19 0800 01/23/19 0552  WBC 9.0 7.4 6.7 5.3  NEUTROABS 6.0  --   --   --   HGB 12.0* 11.4* 11.9* 11.3*  HCT 37.4* 35.6* 37.1* 34.7*  MCV 85.0 84.0 85.1 83.6  PLT 382 509* 530* 366*   Basic Metabolic Panel: Recent Labs  Lab 01/20/19 1157  NA 137  K 4.4  CL 103  CO2 21*  GLUCOSE 87  BUN 8  CREATININE 0.71  CALCIUM 10.0   GFR: Estimated Creatinine Clearance: 79.4 mL/min (by C-G formula based on SCr of 0.71 mg/dL). Liver Function Tests: No results for input(s): AST, ALT, ALKPHOS, BILITOT, PROT, ALBUMIN in the last 168 hours. No results for input(s): LIPASE, AMYLASE in the last 168 hours. No results for input(s): AMMONIA in the last 168 hours. Coagulation Profile: Recent Labs  Lab 01/20/19 1357  INR 1.2   Cardiac Enzymes: No results for input(s): CKTOTAL, CKMB, CKMBINDEX, TROPONINI in the last 168 hours. BNP (last 3 results) No results for input(s): PROBNP in the last 8760  hours. HbA1C: No results for input(s): HGBA1C in the last 72 hours. CBG: No results for input(s): GLUCAP in the last 168 hours. Lipid Profile: No results for input(s): CHOL, HDL, LDLCALC, TRIG, CHOLHDL, LDLDIRECT in the last 72 hours. Thyroid Function Tests: No results for input(s): TSH, T4TOTAL, FREET4, T3FREE, THYROIDAB in the last 72 hours. Anemia Panel: No results for input(s): VITAMINB12, FOLATE, FERRITIN, TIBC, IRON, RETICCTPCT in the last 72 hours. Sepsis Labs: No results for input(s): PROCALCITON, LATICACIDVEN in the last 168 hours.  Recent Results (from the past 240 hour(s))  SARS Coronavirus 2 [facility] order, Performed in [facility] hospital lab) Nasopharyngeal Nasopharyngeal Swab     Status: None   Collection Time: 01/20/19  6:30 PM   Specimen: Nasopharyngeal Swab  Result Value Ref Range Status   SARS Coronavirus 2 NEGATIVE NEGATIVE Final    Comment: (NOTE) If result is NEGATIVE SARS-CoV-2 target nucleic acids are NOT DETECTED. The SARS-CoV-2 RNA is generally detectable in upper and lower  respiratory specimens during the acute phase of infection. The lowest  concentration of SARS-CoV-2 viral copies this assay can detect is 250  copies / mL. A negative result does not preclude SARS-CoV-2 infection  and should not be used as the sole basis for treatment or other  patient management decisions.  A negative result may occur with  improper specimen collection / handling, submission of specimen other  than nasopharyngeal swab, presence of viral mutation(s) within the  areas targeted by this assay, and inadequate number of viral copies  (<250 copies / mL). A negative result must be combined with clinical  observations, patient history, and epidemiological information. If result is POSITIVE SARS-CoV-2 target nucleic acids are DETECTED. The SARS-CoV-2 RNA is generally detectable in upper and lower  respiratory specimens dur ing the acute phase of infection.  Positive   results are indicative of active infection with SARS-CoV-2.  Clinical  correlation with patient history and other diagnostic information is  necessary to determine patient infection status.  Positive results do  not rule out bacterial infection or co-infection with other viruses. If result is PRESUMPTIVE POSTIVE SARS-CoV-2 nucleic acids MAY BE PRESENT.   A presumptive positive result was obtained on the submitted specimen  and confirmed on repeat testing.  While 2019 novel coronavirus  (SARS-CoV-2) nucleic acids may be present in the submitted sample  additional confirmatory testing may be necessary for epidemiological  and / or clinical management purposes  to differentiate between  SARS-CoV-2 and other Sarbecovirus currently known to infect humans.  If clinically indicated additional testing with an alternate test  methodology [phone]) is advised. The SARS-CoV-2 RNA is generally  detectable in upper and lower respiratory sp ecimens during the acute  phase of infection. The expected result is Negative. Fact Sheet for Patients:  StrictlyIdeas.no Fact Sheet for Healthcare Providers: BankingDealers.co.za This test is not yet approved or cleared by the [place] FDA and has been authorized for detection and/or diagnosis of SARS-CoV-2 by FDA under an Emergency Use Authorization (EUA).  This EUA will remain in effect (meaning this test can be used) for the duration of the COVID-19 declaration under Section 564(b)(1) of the Act, 21 U.S.C. section 360bbb-3(b)(1), unless the authorization is terminated or revoked sooner. Performed at [facility], [place] [address] [place], [place] [address]   Surgical PCR screen     Status: Abnormal   Collection Time: 01/22/19  6:01 AM   Specimen: Nasal Mucosa; Nasal Swab  Result Value Ref Range Status   MRSA, PCR NEGATIVE NEGATIVE Final   Staphylococcus aureus POSITIVE (A) NEGATIVE Final     Comment: (NOTE) The Xpert SA Assay (FDA approved for NASAL specimens in patients 56 years of age and older), is one component of a comprehensive surveillance program. It is not intended to diagnose infection nor to guide or monitor treatment. Performed at [facility], [place] [address], [place], [place] [address]          Radiology Studies: Mr Jeri Cos VF Contrast  Result Date: 01/23/2019 CLINICAL DATA:  Renal cell cancer, staging. EXAM: MRI HEAD WITHOUT AND WITH CONTRAST TECHNIQUE: Multiplanar, multiecho pulse sequences of the brain and surrounding structures were obtained without and with intravenous contrast. CONTRAST:  7mL GADAVIST GADOBUTROL 1 MMOL/ML IV SOLN COMPARISON:  CT angiogram neck 01/20/2019 FINDINGS: Brain: Multiple sequences are significantly motion degraded, limiting evaluation. This includes mild motion degradation of postcontrast imaging. No abnormal intracranial enhancement is identified to suggest intracranial metastatic disease. No evidence of  acute infarct. No evidence of intracranial mass. No midline shift or extra-axial fluid collection. No chronic intracranial hemorrhage. A few scattered punctate foci of T2/FLAIR hyperintensity within the cerebral white matter are nonspecific, but may reflect minimal chronic small vessel ischemic disease. Cerebral volume is normal for age. Vascular: Flow voids maintained within the proximal large arterial vessels. Skull and upper cervical spine: Normal marrow signal. Sinuses/Orbits: Imaged globes and orbits demonstrate no acute abnormality. Complete T2 hyperintense opacification of the left maxillary sinus. Mild scattered mucosal thickening within the remainder of the paranasal sinuses. Right mastoid effusion. IMPRESSION: Motion degraded examination. No evidence of intracranial metastatic disease. A few scattered punctate signal changes within the cerebral white matter are nonspecific, but may reflect minimal chronic small vessel  ischemic disease. Complete T2 hyperintense opacification of the left maxillary sinus, nonspecific but possibly reflecting fluid or a large mucous retention cyst. Right mastoid effusion. Electronically Signed   By: Kellie Simmering   On: 01/23/2019 10:59   Ct Abdomen Pelvis W Contrast  Result Date: 01/21/2019 CLINICAL DATA:  New diagnosis of mediastinal mass. Evaluate for lymphoma or metastatic disease. EXAM: CT ABDOMEN AND PELVIS WITH CONTRAST TECHNIQUE: Multidetector CT imaging of the abdomen and pelvis was performed using the standard protocol following bolus administration of intravenous contrast. CONTRAST:  139mL OMNIPAQUE IOHEXOL 300 MG/ML  SOLN COMPARISON:  None. FINDINGS: Lower chest: The unremarkable. Hepatobiliary: Hyperenhancing focus in the anterior left liver likely related to anomalous venous anatomy/flow. Liver otherwise unremarkable. Probable layering sludge in the gallbladder lumen No intrahepatic or extrahepatic biliary dilation. Pancreas: No focal mass lesion. No dilatation of the main duct. No intraparenchymal cyst. No peripancreatic edema. Spleen: No splenomegaly. No focal mass lesion. Adrenals/Urinary Tract: No adrenal nodule or mass. 2 mm nonobstructing stone noted lower pole right kidney. 15 mm subcapsular lesion in the upper pole right kidney has heterogeneous enhancement. Left kidney unremarkable. Ureters not well visualized due to lack of retroperitoneal fat but no hydroureter evident. The urinary bladder appears normal for the degree of distention. Stomach/Bowel: Stomach is unremarkable. No gastric wall thickening. No evidence of outlet obstruction. No small bowel or colonic dilatation. Vascular/Lymphatic: No abdominal aortic aneurysm. No discernible lymphadenopathy in the abdomen or pelvis. Reproductive: The prostate gland and seminal vesicles are unremarkable. Other: No intraperitoneal free fluid. Musculoskeletal: 11 mm sclerotic focus noted posterior left iliac bone. 10 mm sclerotic  focus noted in the posterior right acetabulum. Increased attenuation in lower lumbar vertebral bodies likely enhancement related to opacified venous anatomy. IMPRESSION: 1. 15 mm heterogeneously enhancing lesion in the upper pole right kidney. Renal cell carcinoma a concern. MRI without and with contrast recommended to further evaluate. 2. No lymphadenopathy in the abdomen or pelvis. 3. Focal hyperenhancement in the anterior left liver most likely related to aberrant venous anatomy/flow. 4. Sclerotic lesions in the right acetabulum and left iliac bone are indeterminate. Likely bone islands but attention on follow-up recommended as metastatic cannot be excluded. Electronically Signed   By: Misty Stanley M.D.   On: 01/21/2019 19:41   Ct Biopsy  Result Date: 01/22/2019 CLINICAL DATA:  Mediastinal mass EXAM: CT GUIDED CORE BIOPSY OF MEDIASTINAL MASS ANESTHESIA/SEDATION: Intravenous Fentanyl 67mcg and Versed 1mg  were administered as conscious sedation during continuous monitoring of the patient?s level of consciousness and physiological / cardiorespiratory status by the radiology RN, with a total moderate sedation time of 16 minutes. PROCEDURE: The procedure risks, benefits, and alternatives were explained to the patient. Questions regarding the procedure were encouraged and answered. The patient  understands and consents to the procedure. Select axial scans through the thorax were obtained. The anterior mediastinal mass was localized and an appropriate skin entry site was identified and marked. The operative field was prepped with chlorhexidinein a sterile fashion, and a sterile drape was applied covering the operative field. A sterile gown and sterile gloves were used for the procedure. Local anesthesia was provided with 1% Lidocaine. Under CT fluoroscopic guidance, a 17 gauge trocar needle was advanced to the margin of the lesion. Once needle tip position was confirmed, coaxial 18-gauge core biopsy samples were  obtained, submitted in saline to surgical pathology. The guide needle was removed. Postprocedure scan shows no hematoma, pneumothorax, or other apparent complication. COMPLICATIONS: None immediate FINDINGS: Anterior mediastinal mass was localized and representative core biopsy samples obtained as above. IMPRESSION: Technically successful CT-guided core biopsy of anterior mediastinal mass. Electronically Signed   By: Lucrezia Europe M.D.   On: 01/22/2019 16:42        Scheduled Meds: Continuous Infusions:  sodium chloride 10 mL/hr at 01/21/19 1836   heparin 1,400 Units/hr (01/23/19 0756)   piperacillin-tazobactam (ZOSYN)  IV 3.375 g (01/23/19 0457)     LOS: 3 days    Time spent: 36 minutes.     Hosie Poisson, MD [facility] Pager [phone]  If 7PM-7AM, please contact night-coverage [email or web address] Password [facility] 01/23/2019, 2:54 PM

## 2019-01-23 NOTE — Progress Notes (Signed)
[place] for Heparin Indication: right IJ thrombosis  Allergies  Allergen Reactions  . Pregabalin Other (See Comments)    Homicidal idealizations, pain in lower extremities     Patient Measurements: Height: 5\' 11"  (180.3 cm) Weight: 114 lb 10.2 oz (52 kg) IBW/kg (Calculated) : 75.3 Heparin Dosing Weight: 52.2 kg  Vital Signs: Temp: 98.6 F (37 C) (09/15 0436) Temp Source: Oral (09/15 0436) BP: 93/65 (09/15 0436) Pulse Rate: 98 (09/15 0436)  Labs: Recent Labs    01/21/19 0251  01/21/19 2239 01/22/19 0800 01/23/19 0552 01/23/19 1359  HGB 11.4*  --   --  11.9* 11.3*  --   HCT 35.6*  --   --  37.1* 34.7*  --   PLT 509*  --   --  530* 523*  --   HEPARINUNFRC <0.10*   < > <0.10*  --  0.13* 0.33   < > = values in this interval not displayed.    Estimated Creatinine Clearance: 79.4 mL/min (by C-G formula based on SCr of 0.71 mg/dL).  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   Now s/p mass biopsy.   Heparin level now therapeutic at lower end of range. No bleeding or issues with drip per RN.    Goal of Therapy:  Heparin level 0.3-0.7 units/ml Monitor platelets by anticoagulation protocol: Yes   Plan: Increase heparin to 1450 units / hr  Re-check confirmatory heparin level in 6 hours Daily HL / CBC Monitor for signs / symptoms of bleeding  Demani Mcbrien A. Levada Dy, PharmD, BCPS, FNKF Clinical Pharmacist [place] Please utilize Amion for appropriate phone number to reach the unit pharmacist ([place])

## 2019-01-23 NOTE — Progress Notes (Signed)
[place] for Heparin Indication: right IJ thrombosis  Allergies  Allergen Reactions  . Pregabalin Other (See Comments)    Homicidal idealizations, pain in lower extremities     Patient Measurements: Height: 5\' 11"  (180.3 cm) Weight: 114 lb 10.2 oz (52 kg) IBW/kg (Calculated) : 75.3 Heparin Dosing Weight: 52.2 kg  Vital Signs: Temp: 98.6 F (37 C) (09/15 0436) Temp Source: Oral (09/15 0436) BP: 93/65 (09/15 0436) Pulse Rate: 98 (09/15 0436)  Labs: Recent Labs    01/20/19 1157 01/20/19 1357  01/21/19 0251 01/21/19 1257 01/21/19 2239 01/22/19 0800 01/23/19 0552  HGB 12.0*  --   --  11.4*  --   --  11.9* 11.3*  HCT 37.4*  --   --  35.6*  --   --  37.1* 34.7*  PLT 382  --   --  509*  --   --  530* 523*  APTT  --  30  --   --   --   --   --   --   LABPROT  --  14.8  --   --   --   --   --   --   INR  --  1.2  --   --   --   --   --   --   HEPARINUNFRC  --   --    < > <0.10* <0.10* <0.10*  --  0.13*  CREATININE 0.71  --   --   --   --   --   --   --    < > = values in this interval not displayed.    Estimated Creatinine Clearance: 79.4 mL/min (by C-G formula based on SCr of 0.71 mg/dL).  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   Now s/p mass biopsy.   Heparin level still not therapeutic. Will hold bolus due to recent biopsy, but will increase rate. No bleeding or issues with drip per RN.    Goal of Therapy:  Heparin level 0.3-0.7 units/ml Monitor platelets by anticoagulation protocol: Yes   Plan: Increase heparin to 1400 units / hr, no bolus  Re-check heparin level in 6 hours Daily HL / CBC Monitor for signs / symptoms of bleeding  Guage Efferson A. Levada Dy, PharmD, BCPS, FNKF Clinical Pharmacist [place] Please utilize Amion for appropriate phone number to reach the unit pharmacist ([place])

## 2019-01-23 NOTE — Progress Notes (Signed)
[place] for Heparin Indication: right IJ thrombosis  Allergies  Allergen Reactions  . Pregabalin Other (See Comments)    Homicidal idealizations, pain in lower extremities     Patient Measurements: Height: 5\' 11"  (180.3 cm) Weight: 114 lb 10.2 oz (52 kg) IBW/kg (Calculated) : 75.3 Heparin Dosing Weight: 52.2 kg  Vital Signs: Temp: 97.5 F (36.4 C) (09/15 2137) Temp Source: Oral (09/15 2137) BP: 100/79 (09/15 2137) Pulse Rate: 85 (09/15 2137)  Labs: Recent Labs    01/21/19 0251  01/22/19 0800 01/23/19 0552 01/23/19 1359 01/23/19 2055  HGB 11.4*  --  11.9* 11.3*  --   --   HCT 35.6*  --  37.1* 34.7*  --   --   PLT 509*  --  530* 523*  --   --   HEPARINUNFRC <0.10*   < >  --  0.13* 0.33 0.37   < > = values in this interval not displayed.    Estimated Creatinine Clearance: 79.4 mL/min (by C-G formula based on SCr of 0.71 mg/dL).  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   Now s/p mass biopsy.   Heparin level now therapeutic x 2 at lower end of range. No bleeding or issues with drip per RN.    Goal of Therapy:  Heparin level 0.3-0.7 units/ml Monitor platelets by anticoagulation protocol: Yes   Plan: Continue heparin to 1450 units / hr  Daily HL / CBC Monitor for signs / symptoms of bleeding  Alanda Slim, PharmD, [facility] Clinical Pharmacist Please see AMION for all Pharmacists' Contact Phone Numbers 01/23/2019, 9:57 PM

## 2019-01-23 NOTE — TOC Initial Note (Signed)
Transition of Care [facility]) - Initial/Assessment Note    Patient Details  Name: David Berry MRN: [id number] Date of Birth: 15-May-1965  Transition of Care [facility]) CM/SW Contact:    Marilu Favre, RN Phone Number: 01/23/2019, 11:52 AM  Clinical Narrative:                 Confirmed face sheet information. Patient states he has insurance [employer]. Patient will call admitting to have information entered in system.   Patient had appointment tomorrow with Dr Grier Mitts at [place] [facility] [place]. Patient will reschedule.   Patient wanting NCM to help him with short term disability. Patient states he has been out of work, advised patient to call human resources at his work . Patient voiced understanding.   Will continue to follow.     Expected Discharge Plan: Home/Self Care Barriers to Discharge: Continued Medical Work up   Patient Goals and CMS Choice Patient states their goals for this hospitalization and ongoing recovery are:: to go home CMS Medicare.gov Compare Post Acute Care list provided to:: Patient    Expected Discharge Plan and Services Expected Discharge Plan: Home/Self Care   Discharge Planning Services: CM Consult   Living arrangements for the past 2 months: Apartment                 DME Arranged: N/A         HH Arranged: NA          Prior Living Arrangements/Services Living arrangements for the past 2 months: Apartment Lives with:: Self Patient language and need for interpreter reviewed:: Yes Do you feel safe going back to the place where you live?: Yes      Need for Family Participation in Patient Care: Yes (Comment) Care giver support system in place?: Yes (comment)   Criminal Activity/Legal Involvement Pertinent to Current Situation/Hospitalization: No - Comment as needed  Activities of Daily Living Home Assistive Devices/Equipment: None ADL Screening (condition at time of admission) Patient's cognitive ability adequate to safely complete daily  activities?: Yes Is the patient deaf or have difficulty hearing?: No Does the patient have difficulty seeing, even when wearing glasses/contacts?: No Does the patient have difficulty concentrating, remembering, or making decisions?: No Patient able to express need for assistance with ADLs?: Yes Does the patient have difficulty dressing or bathing?: No Independently performs ADLs?: Yes (appropriate for developmental age) Does the patient have difficulty walking or climbing stairs?: No Weakness of Legs: None Weakness of Arms/Hands: None  Permission Sought/Granted   Permission granted to share information with : No              Emotional Assessment Appearance:: Appears stated age   Affect (typically observed): Accepting Orientation: : Oriented to Self, Oriented to Place, Oriented to  Time, Oriented to Situation Alcohol / Substance Use: Not Applicable Psych Involvement: No (comment)  Admission diagnosis:  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 Mass of thoracic structure   . Thrombus   . Extravasation injury of IV catheter site with other complication ([place])   . Skin rash   . Neck mass 01/20/2019  . BACK PAIN, LUMBAR 03/24/2010  . LUMBAR RADICULOPATHY 03/24/2010   PCP:  Patient, No Pcp Per Pharmacy:   [id number] - [place], [place] [address] [place] [address] Phone: [phone] Fax: [phone]  [facility]  [place], [place] - [address] [place] [address] Phone: [phone] Fax: [phone]     Social Determinants of Health ([place]) Interventions    Readmission Risk Interventions No flowsheet data found.

## 2019-01-23 NOTE — Evaluation (Signed)
Clinical/Bedside Swallow Evaluation Patient Details  Name: David Berry MRN: [id number] Date of Birth: Feb 27, 1966  Today's Date: 01/23/2019 Time: SLP Start Time (ACUTE ONLY): 0806 SLP Stop Time (ACUTE ONLY): 7616 SLP Time Calculation (min) (ACUTE ONLY): 16 min  Past Medical History: History reviewed. No pertinent past medical history. Past Surgical History:  Past Surgical History:  Procedure Laterality Date  . APPENDECTOMY     teenager  . INGUINAL HERNIA REPAIR Right 2016, 2017   [facility], 2018 [facility]-removed mesh   HPI:  Pt is a 53 year old gentleman with no prior medical history who presented to ED for right upper extremity pain and right sided neck mass and swelling. CT angiogram of the neck with and without contrast showedbulky soft tissue masses in the right neck and continuing into the upper chest most compatible with extensive metastatic   Assessment / Plan / Recommendation Clinical Impression  Pt had no overt s/s of aspiration other than a single throat clear noted after large, sequential cup sips of water. Min cues for slower rate eliminated any further signs of difficulty with thin liquids. He describes a "tight" feeling in his throat, primarily with solids, and alleviated with sips of water. He believes this has improved since admission as he also feels less swollen. He also describes a h/o esophageal issues, including GER, prior dilations, and difficulty swallowing pills. Recommend softening diet to Dys 3 textures, continuing thin liquids. Note that pt may transfer to [facility] today. While inpatient he would benefit from additional SLP f/u for tolerance and consideration of MBS, particularly depending upon possible course of tx as bx results are known. SLP Visit Diagnosis: Dysphagia, unspecified (R13.10)    Aspiration Risk  Mild aspiration risk    Diet Recommendation Dysphagia 3 (Mech soft);Thin liquid   Liquid Administration via: Cup;Straw Medication  Administration: Other (Comment)(pt prefers crushed with thin liquid) Supervision: Patient able to self feed;Intermittent supervision to cue for compensatory strategies Compensations: Slow rate;Small sips/bites;Follow solids with liquid Postural Changes: Seated upright at 90 degrees;Remain upright for at least 30 minutes after po intake    Other  Recommendations Oral Care Recommendations: Oral care BID   Follow up Recommendations (tba pending bx results)      Frequency and Duration min 2x/week  2 weeks       Prognosis Prognosis for Safe Diet Advancement: Good      Swallow Study   General HPI: Pt is a 53 year old gentleman with no prior medical history who presented to ED for right upper extremity pain and right sided neck mass and swelling. CT angiogram of the neck with and without contrast showedbulky soft tissue masses in the right neck and continuing into the upper chest most compatible with extensive metastatic Type of Study: Bedside Swallow Evaluation Previous Swallow Assessment: none in chart Diet Prior to this Study: Regular;Thin liquids Temperature Spikes Noted: No Respiratory Status: Room air History of Recent Intubation: No Behavior/Cognition: Alert;Cooperative;Pleasant mood Oral Cavity Assessment: Within Functional Limits Oral Care Completed by SLP: No Oral Cavity - Dentition: Adequate natural dentition;Missing dentition Vision: Functional for self-feeding Self-Feeding Abilities: Able to feed self Patient Positioning: Upright in chair Baseline Vocal Quality: Normal Volitional Swallow: Able to elicit    Oral/Motor/Sensory Function Overall Oral Motor/Sensory Function: Within functional limits   Ice Chips Ice chips: Not tested   Thin Liquid Thin Liquid: Impaired Presentation: Cup;Self Fed;Straw Pharyngeal  Phase Impairments: Throat Clearing - Immediate    Nectar Thick Nectar Thick Liquid: Not tested   Honey Thick Honey  Thick Liquid: Not tested   Puree Puree:  Within functional limits Presentation: Self Fed;Spoon   Solid     Solid: Impaired Presentation: Self Fed Pharyngeal Phase Impairments: Other (comments)(facial grimacing, reports of it feeling "tight")      David Berry 01/23/2019,9:07 AM  Pollyann Glen, M.A. [place] Acute Environmental education officer [phone] Office [phone]

## 2019-01-24 ENCOUNTER — Ambulatory Visit: Payer: 59 | Admitting: Family Medicine

## 2019-01-24 DIAGNOSIS — R221 Localized swelling, mass and lump, neck: Secondary | ICD-10-CM

## 2019-01-24 DIAGNOSIS — E010 Iodine-deficiency related diffuse (endemic) goiter: Secondary | ICD-10-CM

## 2019-01-24 DIAGNOSIS — I82C11 Acute embolism and thrombosis of right internal jugular vein: Secondary | ICD-10-CM

## 2019-01-24 DIAGNOSIS — R2241 Localized swelling, mass and lump, right lower limb: Secondary | ICD-10-CM

## 2019-01-24 DIAGNOSIS — J9859 Other diseases of mediastinum, not elsewhere classified: Secondary | ICD-10-CM

## 2019-01-24 DIAGNOSIS — N2889 Other specified disorders of kidney and ureter: Secondary | ICD-10-CM

## 2019-01-24 LAB — CBC
HCT: 33.1 % — ABNORMAL LOW (ref 39.0–52.0)
Hemoglobin: 10.9 g/dL — ABNORMAL LOW (ref 13.0–17.0)
MCH: 27.5 pg (ref 26.0–34.0)
MCHC: 32.9 g/dL (ref 30.0–36.0)
MCV: 83.4 fL (ref 80.0–100.0)
Platelets: 482 10*3/uL — ABNORMAL HIGH (ref 150–400)
RBC: 3.97 MIL/uL — ABNORMAL LOW (ref 4.22–5.81)
RDW: 14.4 % (ref 11.5–15.5)
WBC: 6.1 10*3/uL (ref 4.0–10.5)
nRBC: 0 % (ref 0.0–0.2)

## 2019-01-24 LAB — HEPARIN LEVEL (UNFRACTIONATED): Heparin Unfractionated: 0.31 IU/mL (ref 0.30–0.70)

## 2019-01-24 MED ORDER — MUPIROCIN 2 % EX OINT
1.0000 "application " | TOPICAL_OINTMENT | Freq: Two times a day (BID) | CUTANEOUS | Status: AC
  Administered 2019-01-24 – 2019-01-28 (×8): 1 via NASAL
  Filled 2019-01-24 (×3): qty 22

## 2019-01-24 MED ORDER — CHLORHEXIDINE GLUCONATE CLOTH 2 % EX PADS
6.0000 | MEDICATED_PAD | Freq: Every day | CUTANEOUS | Status: AC
  Administered 2019-01-24: 12:00:00 6 via TOPICAL

## 2019-01-24 NOTE — Progress Notes (Signed)
Pharmacy Antibiotic Note  David Berry is a 53 y.o. male admitted on 01/20/2019 with upper extremity neck pain and right sided neck mass and swell. Now being consulted for zosyn for empiric treatment of POST OBSTRUCTIVE PNEUMONIA ON CT CHEST. Pharmacy has been consulted for Zosyn dosing. Patient CrCl 79.2ml/min (scr 0.71), WBC 6.1.   Plan: Continue Day #4 of Zosyn 3.375 grams IV q8h (Extended-infusion duration) Follow-up LOT, signs/symptoms of infection, clinical improvement, de-escalation.   Height: 5\' 11"  (180.3 cm) Weight: 114 lb 10.2 oz (52 kg) IBW/kg (Calculated) : 75.3  Temp (24hrs), Avg:97.9 F (36.6 C), Min:97.5 F (36.4 C), Max:98.6 F (37 C)  Recent Labs  Lab 01/20/19 1157 01/21/19 0251 01/22/19 0800 01/23/19 0552 01/24/19 0151  WBC 9.0 7.4 6.7 5.3 6.1  CREATININE 0.71  --   --   --   --     Estimated Creatinine Clearance: 79.4 mL/min (by C-G formula based on SCr of 0.71 mg/dL).    Allergies  Allergen Reactions  . Pregabalin Other (See Comments)    Homicidal idealizations, pain in lower extremities      Antimicrobials this admission: Zosyn 9/13 >>    Dose adjustments this admission: N/a  Microbiology results: Covid-19: Negative No other cultures sent   David Berry A. Levada Dy, PharmD, BCPS, FNKF Clinical Pharmacist [place] Please utilize Amion for appropriate phone number to reach the unit pharmacist ([place])

## 2019-01-24 NOTE — Progress Notes (Signed)
Pt reported that CHG bath was causing his eczema to break out. Observed a faint red rash on neck and upper chest. Pt discontinued using CHG bath . Will notify MD.

## 2019-01-24 NOTE — Progress Notes (Signed)
[place] for Heparin Indication: right IJ thrombosis  Allergies  Allergen Reactions  . Pregabalin Other (See Comments)    Homicidal idealizations, pain in lower extremities     Patient Measurements: Height: 5\' 11"  (180.3 cm) Weight: 114 lb 10.2 oz (52 kg) IBW/kg (Calculated) : 75.3 Heparin Dosing Weight: 52.2 kg  Vital Signs: Temp: 97.7 F (36.5 C) (09/16 0504) Temp Source: Oral (09/16 0504) BP: 92/68 (09/16 0504) Pulse Rate: 57 (09/16 0504)  Labs: Recent Labs    01/22/19 0800 01/23/19 0552 01/23/19 1359 01/23/19 2055 01/24/19 0151  HGB 11.9* 11.3*  --   --  10.9*  HCT 37.1* 34.7*  --   --  33.1*  PLT 530* 523*  --   --  482*  HEPARINUNFRC  --  0.13* 0.33 0.37 0.31    Estimated Creatinine Clearance: 79.4 mL/min (by C-G formula based on SCr of 0.71 mg/dL).  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   Now s/p mass biopsy. Hgb 10.9, PLT stable   Heparin level therapeutic at lower end of range. No bleeding or issues with drip noted    Goal of Therapy:  Heparin level 0.3-0.7 units/ml Monitor platelets by anticoagulation protocol: Yes   Plan: Increase heparin to 1500 units / hr  Daily HL / CBC Monitor for signs / symptoms of bleeding  Cerise Lieber A. Levada Dy, PharmD, BCPS, FNKF Clinical Pharmacist [place] Please utilize Amion for appropriate phone number to reach the unit pharmacist ([place])

## 2019-01-24 NOTE — TOC Progression Note (Signed)
Transition of Care [facility]) - Progression Note    Patient Details  Name: David Berry MRN: [id number] Date of Birth: April 18, 1966  [facility] CM/SW Contact  Jacalyn Lefevre Edson Snowball, RN Phone Number: 01/24/2019, 9:22 AM  Clinical Narrative:     Patient has spoken to [place] regarding his insurance, who will who will verify if insurance is active.   If insurance not active will assist with [employer] and [employer] [facility].   Expected Discharge Plan: Home/Self Care Barriers to Discharge: Continued Medical Work up  Expected Discharge Plan and Services Expected Discharge Plan: Home/Self Care   Discharge Planning Services: CM Consult   Living arrangements for the past 2 months: Apartment                 DME Arranged: N/A         HH Arranged: NA           Social Determinants of Health (SDOH) Interventions    Readmission Risk Interventions No flowsheet data found.

## 2019-01-24 NOTE — Progress Notes (Addendum)
PROGRESS NOTE    David Berry   PPJ:[id number]  DOB: 14-Aug-1965  DOA: 01/20/2019 PCP: Patient, No Pcp Per   Brief Narrative:  David Berry is a 53 year old male who smokes presents for right side of the neck and pain in the same area.    9/12:   Thyroid ultrasound:    Thyromegaly without nodule,  Suspected right IJ vein occlusive thrombosis.  Heparin started.   CTA neck: 1. Bulky soft tissue masses in the right neck and continuing into the upper chest most compatible with extensive metastatic lymphadenopathy. Individual nodal masses up to 34 mm. 2. Associated thrombosis of the Right IJ, Right Innominate Vein, and likely also the SVC - See Chest CTA reported separately. 3. Mild retropharyngeal effusion likely related to #2.  CTA chest: 1. No acute pulmonary embolus or aortic dissection. 2. Large poorly defined malignant-appearing infiltrative mass measuring approximately 6.2 x 5.6 x 7.9 cm within the right anterior and middle mediastinum. Suspected invasion of distal left brachiocephalic vein and probable tumor occlusion of the right subclavian and jugular veins. Suspected tumor invasion of the upper SVC with string like narrowing of the SVC, but with patency of the SVC just above the right atrium. Tumor abuts the aorta and great vessels and also results in significant narrowing of right upper lobe pulmonary arterial vessels. Incompletely visualized right supraclavicular adenopathy. Right hilar nodes and or mass. 3. At least 2 foci of somewhat nodular appearing airspace disease in the right upper lobe, either representing pneumonia or foci of neoplasm. 4. Emphysema  9/13: CT abd/pelvis 1. 15 mm heterogeneously enhancing lesion in the upper pole right kidney. Renal cell carcinoma a concern. MRI without and with contrast recommended to further evaluate. 2. No lymphadenopathy in the abdomen or pelvis. 3. Focal hyperenhancement in the anterior left liver most  likely related to aberrant venous anatomy/flow. 4. Sclerotic lesions in the right acetabulum and left iliac bone are indeterminate. Likely bone islands but attention on follow-up recommended as metastatic cannot be excluded.  Consult with CT surgery: Dr Cyndia Bent recommended an ENT consult Zosyn started  9/14: Consult with ENT: Dr Blenda Nicely recommended Pulm or IR consult for biopsy Consult with Pulm: Dr Carlis Abbott recommends IR biopsy and probable need for urgent radiation of neck mass He underwent a CT guided biopsy of anterior mediastinal mass by IR, Dr Jarvis Newcomer  9/15 MRI brain: no masses    Subjective: Angry that biopsy results are not back yet. He states he was told they come back in 1-2 days.  Pain is controlled with current meds. Had BM yesterday and thus no constipation with narcotics. No other complaints.     Assessment & Plan:   Principal Problem:   Mass of right side of neck   Mediastinal mass   Right renal mass   Hip mass, right - all masses likely to be related- awaiting  Biopsy results of Mediastinal mass - will need radiation of right neck mass depending on biopsy results - pain is controlled currently with Norco  Active Problems:     Internal jugular (IJ) vein thromboembolism, acute, right (HCC) CTA> " thrombosis of the Right IJ, Right Innominate Vein, and likely also the SVC"  - therapeutic on Heparin infusion which I will continue- once results are obtained from biopsy, will need to decide on whether he needs another procedure (ie. a port) or if we can switch to long acting medications  ? Of nodules in RUL suggestive of pneumonia or cancer - d/c  Zosyn as he has no cough, fever or leukocytosis- clinically there is no pneumonia  Nicotine abuse - counseled  - will need PFTs- imaging above suggestive of emphysema  Thyromegaly - TSH normal at 2.020- outpt surveillance needed  Time spent in minutes: 45 in reviewing prior inpatient and outpt chart DVT prophylaxis:  Heparin infusion Code Status: full code Family Communication: none Disposition Plan: awaiting path results Consultants:   Ct surgery  ENT  Pulm  IR  Phone conversation with oncology Procedures:   2 D ECHO 1. The left ventricle has low normal systolic function, with an ejection fraction of 50-55%. The cavity size was normal. Left ventricular diastolic Doppler parameters are consistent with impaired relaxation.  2. The right ventricle has normal systolic function. The cavity was normal. There is no increase in right ventricular wall thickness.  3. The mitral valve is degenerative. Moderate thickening of the mitral valve leaflet.  4. The aortic valve is tricuspid.  5. The aorta is normal unless otherwise noted.  6. No intracardiac thrombi or masses were visualized.  Antimicrobials:  Anti-infectives (From admission, onward)   Start     Dose/Rate Route Frequency Ordered Stop   01/21/19 1430  piperacillin-tazobactam (ZOSYN) IVPB 3.375 g  Status:  Discontinued     3.375 g 12.5 mL/hr over 240 Minutes Intravenous Every 8 hours 01/21/19 1415 01/24/19 0914       Objective: Vitals:   01/23/19 0436 01/23/19 1445 01/23/19 2137 01/24/19 0504  BP: 93/65 (!) 108/59 100/79 92/68  Pulse: 98 75 85 (!) 57  Resp: 18 16    Temp: 98.6 F (37 C) 98.6 F (37 C) (!) 97.5 F (36.4 C) 97.7 F (36.5 C)  TempSrc: Oral Oral Oral Oral  SpO2: 98% 100% 99% 100%  Weight:      Height:        Intake/Output Summary (Last 24 hours) at 01/24/2019 1005 Last data filed at 01/23/2019 2123 Gross per 24 hour  Intake 653.13 ml  Output --  Net 653.13 ml   Filed Weights   01/20/19 2000 01/20/19 2100  Weight: 52.2 kg 52 kg    Examination: General exam: Appears comfortable  HEENT: PERRLA, oral mucosa moist, no sclera icterus or thrush- mass in right neck with tenderness noted Respiratory system: Clear to auscultation. Respiratory effort normal. Cardiovascular system: S1 & S2 heard, RRR.    Gastrointestinal system: Abdomen soft, non-tender, nondistended. Normal bowel sounds. Central nervous system: Alert and oriented. No focal neurological deficits. Extremities: No cyanosis, clubbing or edema Skin: No rashes or ulcers Psychiatry:  Appears angry and is argumentative     Data Reviewed: I have personally reviewed following labs and imaging studies  CBC: Recent Labs  Lab 01/20/19 1157 01/21/19 0251 01/22/19 0800 01/23/19 0552 01/24/19 0151  WBC 9.0 7.4 6.7 5.3 6.1  NEUTROABS 6.0  --   --   --   --   HGB 12.0* 11.4* 11.9* 11.3* 10.9*  HCT 37.4* 35.6* 37.1* 34.7* 33.1*  MCV 85.0 84.0 85.1 83.6 83.4  PLT 382 509* 530* 523* 629*   Basic Metabolic Panel: Recent Labs  Lab 01/20/19 1157  NA 137  K 4.4  CL 103  CO2 21*  GLUCOSE 87  BUN 8  CREATININE 0.71  CALCIUM 10.0   GFR: Estimated Creatinine Clearance: 79.4 mL/min (by C-G formula based on SCr of 0.71 mg/dL). Liver Function Tests: No results for input(s): AST, ALT, ALKPHOS, BILITOT, PROT, ALBUMIN in the last 168 hours. No results for input(s): LIPASE,  AMYLASE in the last 168 hours. No results for input(s): AMMONIA in the last 168 hours. Coagulation Profile: Recent Labs  Lab 01/20/19 1357  INR 1.2   Cardiac Enzymes: No results for input(s): CKTOTAL, CKMB, CKMBINDEX, TROPONINI in the last 168 hours. BNP (last 3 results) No results for input(s): PROBNP in the last 8760 hours. HbA1C: No results for input(s): HGBA1C in the last 72 hours. CBG: No results for input(s): GLUCAP in the last 168 hours. Lipid Profile: No results for input(s): CHOL, HDL, LDLCALC, TRIG, CHOLHDL, LDLDIRECT in the last 72 hours. Thyroid Function Tests: No results for input(s): TSH, T4TOTAL, FREET4, T3FREE, THYROIDAB in the last 72 hours. Anemia Panel: No results for input(s): VITAMINB12, FOLATE, FERRITIN, TIBC, IRON, RETICCTPCT in the last 72 hours. Urine analysis: No results found for: COLORURINE, APPEARANCEUR, LABSPEC,  PHURINE, GLUCOSEU, HGBUR, BILIRUBINUR, KETONESUR, PROTEINUR, UROBILINOGEN, NITRITE, LEUKOCYTESUR Sepsis Labs: @LABRCNTIP (procalcitonin:4,lacticidven:4) ) Recent Results (from the past 240 hour(s))  SARS Coronavirus 2 [facility] order, Performed in [facility] hospital lab) Nasopharyngeal Nasopharyngeal Swab     Status: None   Collection Time: 01/20/19  6:30 PM   Specimen: Nasopharyngeal Swab  Result Value Ref Range Status   SARS Coronavirus 2 NEGATIVE NEGATIVE Final    Comment: (NOTE) If result is NEGATIVE SARS-CoV-2 target nucleic acids are NOT DETECTED. The SARS-CoV-2 RNA is generally detectable in upper and lower  respiratory specimens during the acute phase of infection. The lowest  concentration of SARS-CoV-2 viral copies this assay can detect is 250  copies / mL. A negative result does not preclude SARS-CoV-2 infection  and should not be used as the sole basis for treatment or other  patient management decisions.  A negative result may occur with  improper specimen collection / handling, submission of specimen other  than nasopharyngeal swab, presence of viral mutation(s) within the  areas targeted by this assay, and inadequate number of viral copies  (<250 copies / mL). A negative result must be combined with clinical  observations, patient history, and epidemiological information. If result is POSITIVE SARS-CoV-2 target nucleic acids are DETECTED. The SARS-CoV-2 RNA is generally detectable in upper and lower  respiratory specimens dur ing the acute phase of infection.  Positive  results are indicative of active infection with SARS-CoV-2.  Clinical  correlation with patient history and other diagnostic information is  necessary to determine patient infection status.  Positive results do  not rule out bacterial infection or co-infection with other viruses. If result is PRESUMPTIVE POSTIVE SARS-CoV-2 nucleic acids MAY BE PRESENT.   A presumptive positive result was obtained on  the submitted specimen  and confirmed on repeat testing.  While 2019 novel coronavirus  (SARS-CoV-2) nucleic acids may be present in the submitted sample  additional confirmatory testing may be necessary for epidemiological  and / or clinical management purposes  to differentiate between  SARS-CoV-2 and other Sarbecovirus currently known to infect humans.  If clinically indicated additional testing with an alternate test  methodology [id number]) is advised. The SARS-CoV-2 RNA is generally  detectable in upper and lower respiratory sp ecimens during the acute  phase of infection. The expected result is Negative. Fact Sheet for Patients:  StrictlyIdeas.no Fact Sheet for Healthcare Providers: BankingDealers.co.za This test is not yet approved or cleared by the [place] FDA and has been authorized for detection and/or diagnosis of SARS-CoV-2 by FDA under an Emergency Use Authorization (EUA).  This EUA will remain in effect (meaning this test can be used) for the duration of the  COVID-19 declaration under Section 564(b)(1) of the Act, 21 U.S.C. section 360bbb-3(b)(1), unless the authorization is terminated or revoked sooner. Performed at [facility], [place] [address] [place], [place] [address]   Surgical PCR screen     Status: Abnormal   Collection Time: 01/22/19  6:01 AM   Specimen: Nasal Mucosa; Nasal Swab  Result Value Ref Range Status   MRSA, PCR NEGATIVE NEGATIVE Final   Staphylococcus aureus POSITIVE (A) NEGATIVE Final    Comment: (NOTE) The Xpert SA Assay (FDA approved for NASAL specimens in patients 87 years of age and older), is one component of a comprehensive surveillance program. It is not intended to diagnose infection nor to guide or monitor treatment. Performed at [facility], [place] [address] [place],  [address]          Radiology Studies: Mr Jeri Cos GN Contrast  Result Date:  01/23/2019 CLINICAL DATA:  Renal cell cancer, staging. EXAM: MRI HEAD WITHOUT AND WITH CONTRAST TECHNIQUE: Multiplanar, multiecho pulse sequences of the brain and surrounding structures were obtained without and with intravenous contrast. CONTRAST:  52mL GADAVIST GADOBUTROL 1 MMOL/ML IV SOLN COMPARISON:  CT angiogram neck 01/20/2019 FINDINGS: Brain: Multiple sequences are significantly motion degraded, limiting evaluation. This includes mild motion degradation of postcontrast imaging. No abnormal intracranial enhancement is identified to suggest intracranial metastatic disease. No evidence of acute infarct. No evidence of intracranial mass. No midline shift or extra-axial fluid collection. No chronic intracranial hemorrhage. A few scattered punctate foci of T2/FLAIR hyperintensity within the cerebral white matter are nonspecific, but may reflect minimal chronic small vessel ischemic disease. Cerebral volume is normal for age. Vascular: Flow voids maintained within the proximal large arterial vessels. Skull and upper cervical spine: Normal marrow signal. Sinuses/Orbits: Imaged globes and orbits demonstrate no acute abnormality. Complete T2 hyperintense opacification of the left maxillary sinus. Mild scattered mucosal thickening within the remainder of the paranasal sinuses. Right mastoid effusion. IMPRESSION: Motion degraded examination. No evidence of intracranial metastatic disease. A few scattered punctate signal changes within the cerebral white matter are nonspecific, but may reflect minimal chronic small vessel ischemic disease. Complete T2 hyperintense opacification of the left maxillary sinus, nonspecific but possibly reflecting fluid or a large mucous retention cyst. Right mastoid effusion. Electronically Signed   By: Kellie Simmering   On: 01/23/2019 10:59   Ct Biopsy  Result Date: 01/22/2019 CLINICAL DATA:  Mediastinal mass EXAM: CT GUIDED CORE BIOPSY OF MEDIASTINAL MASS ANESTHESIA/SEDATION: Intravenous  Fentanyl 6mcg and Versed 1mg  were administered as conscious sedation during continuous monitoring of the patient?s level of consciousness and physiological / cardiorespiratory status by the radiology RN, with a total moderate sedation time of 16 minutes. PROCEDURE: The procedure risks, benefits, and alternatives were explained to the patient. Questions regarding the procedure were encouraged and answered. The patient understands and consents to the procedure. Select axial scans through the thorax were obtained. The anterior mediastinal mass was localized and an appropriate skin entry site was identified and marked. The operative field was prepped with chlorhexidinein a sterile fashion, and a sterile drape was applied covering the operative field. A sterile gown and sterile gloves were used for the procedure. Local anesthesia was provided with 1% Lidocaine. Under CT fluoroscopic guidance, a 17 gauge trocar needle was advanced to the margin of the lesion. Once needle tip position was confirmed, coaxial 18-gauge core biopsy samples were obtained, submitted in saline to surgical pathology. The guide needle was removed. Postprocedure scan shows no hematoma, pneumothorax,  or other apparent complication. COMPLICATIONS: None immediate FINDINGS: Anterior mediastinal mass was localized and representative core biopsy samples obtained as above. IMPRESSION: Technically successful CT-guided core biopsy of anterior mediastinal mass. Electronically Signed   By: Lucrezia Europe M.D.   On: 01/22/2019 16:42      Scheduled Meds:  Chlorhexidine Gluconate Cloth  6 each Topical Daily   mupirocin ointment  1 application Nasal BID   Continuous Infusions:  sodium chloride Stopped (01/23/19 1610)   heparin 1,500 Units/hr (01/24/19 0955)     LOS: 4 days      Debbe Odea, MD [facility] Pager: [email or web address] Password [email or web address] 01/24/2019, 10:05 AM

## 2019-01-25 ENCOUNTER — Encounter (HOSPITAL_COMMUNITY): Payer: Self-pay | Admitting: Oncology

## 2019-01-25 ENCOUNTER — Ambulatory Visit
Admit: 2019-01-25 | Discharge: 2019-01-25 | Disposition: A | Discharge status: Home / Self Care | Payer: Medicaid Other | Source: Ambulatory Visit | Attending: Radiation Oncology | Admitting: Radiation Oncology

## 2019-01-25 DIAGNOSIS — C3490 Malignant neoplasm of unspecified part of unspecified bronchus or lung: Secondary | ICD-10-CM

## 2019-01-25 DIAGNOSIS — I871 Compression of vein: Secondary | ICD-10-CM | POA: Insufficient documentation

## 2019-01-25 DIAGNOSIS — R221 Localized swelling, mass and lump, neck: Secondary | ICD-10-CM

## 2019-01-25 DIAGNOSIS — C3491 Malignant neoplasm of unspecified part of right bronchus or lung: Secondary | ICD-10-CM | POA: Insufficient documentation

## 2019-01-25 DIAGNOSIS — I82C11 Acute embolism and thrombosis of right internal jugular vein: Secondary | ICD-10-CM

## 2019-01-25 DIAGNOSIS — Z510 Encounter for antineoplastic radiation therapy: Secondary | ICD-10-CM | POA: Insufficient documentation

## 2019-01-25 LAB — CBC
HCT: 34.1 % — ABNORMAL LOW (ref 39.0–52.0)
Hemoglobin: 10.8 g/dL — ABNORMAL LOW (ref 13.0–17.0)
MCH: 26.6 pg (ref 26.0–34.0)
MCHC: 31.7 g/dL (ref 30.0–36.0)
MCV: 84 fL (ref 80.0–100.0)
Platelets: 474 10*3/uL — ABNORMAL HIGH (ref 150–400)
RBC: 4.06 MIL/uL — ABNORMAL LOW (ref 4.22–5.81)
RDW: 14.5 % (ref 11.5–15.5)
WBC: 7 10*3/uL (ref 4.0–10.5)
nRBC: 0 % (ref 0.0–0.2)

## 2019-01-25 LAB — HEPARIN LEVEL (UNFRACTIONATED): Heparin Unfractionated: 0.32 IU/mL (ref 0.30–0.70)

## 2019-01-25 MED ORDER — ENSURE ENLIVE PO LIQD
237.0000 mL | Freq: Three times a day (TID) | ORAL | Status: DC
  Administered 2019-01-25 – 2019-01-26 (×2): 237 mL via ORAL

## 2019-01-25 MED ORDER — ADULT MULTIVITAMIN W/MINERALS CH
1.0000 | ORAL_TABLET | Freq: Every day | ORAL | Status: DC
  Administered 2019-01-26 – 2019-01-29 (×4): 1 via ORAL
  Filled 2019-01-25 (×4): qty 1

## 2019-01-25 NOTE — Progress Notes (Signed)
Patient transferred to [facility] via [facility].

## 2019-01-25 NOTE — Consult Note (Addendum)
[place]  [phone] Fax:([phone]   MEDICAL ONCOLOGY - INITIAL CONSULTATION  Referral MD: Dr. 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  HPI: David Berry is a 53 year old male with no significant medical history.  He presented to the hospital with progressive right arm and chest pain with new right-sided neck swelling.  The patient states that he was diagnosed with carpal tunnel syndrome received an injection in August.  However, he continued to have progressive pain up and down his right arm that would also radiate to his right chest.  He took ibuprofen with no improvement.  1 week prior to admission, he developed new right sided neck swelling.  He presented to the emergency room for further evaluation.  Work-up in the emergency room was significant for a CT angiogram of the neck and chest which revealed a bulky soft tissue mass in the right neck continuing into the upper chest most compatible with extensive metastatic lymphadenopathy.  This mass measures up to 34 mm and encases multiple vessels including the right upper lobe pulmonary vessels, superior vena cava, and brachiocephalic vessel.  He also had a large poorly defined malignant appearing infiltrative mass measuring 6.2 x 5.6 x 7.9 cm within the right anterior and middle mediastinum with suspected invasion of the distal left brachiocephalic vein and probable tumor occlusion of the right subclavian and jugular veins.  There is also suspected tumor invasion of the upper SVC with string-like narrowing of the SVC but with patency of the SVC just above the right atrium.  The tumor abuts the aorta and great vessels and also results in significant narrowing of the right upper lobe pulmonary arterial vessels.  He also had a CT of the abdomen pelvis which  showed a 15 mm heterogeneously enhancing lesion in the upper pole of the right kidney concerning for renal cell carcinoma, no lymphadenopathy in the abdomen or pelvis, focal hyperenhancement of the anterior left liver most likely related to aberrant venous anatomy/flow, sclerotic lesions in the right acetabulum and left iliac bone are indeterminate.  These are likely bone islands but attention on follow-up is recommended a metastatic disease cannot be excluded.  MRI of the brain with and without contrast did not show any evidence of intracranial metastatic disease. The patient underwent a CT-guided biopsy in interventional radiology on 01/22/2019.  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  When seen today, patient reports ongoing pain and swelling to his right arm and right side of his neck.  Reports ongoing pain to his right chest.  He reports intermittent facial swelling which is worse in the morning and improves throughout the day.  He reports anorexia and a weight loss of 20 pounds over the past few weeks. He has had dizziness for the past few months.  Denies headaches.  He also noted that on the day of admission his peripheral vision was poor.  His vision is now improved.  He denies food getting stuck or difficulty swallowing but does feel as though his throat is "tight."  He reports a nonproductive cough.  Denies fevers and chills.  He is not really having any shortness of breath.  Denies abdominal pain, nausea, vomiting, constipation, diarrhea.  He has not noticed any epistaxis, hemoptysis, hematemesis, hematuria, melena, hematochezia.  The patient is  single.  He has 1 daughter who lives in [place], [place].  Reports occasional alcohol use and currently smokes 3 cigars a day since 1997.  He is currently living in a rooming house with other roommates who all have their own room.  They share a common bathroom and  kitchen.  Medical oncology was asked see the patient to make recommendations regarding his newly diagnosed poorly differentiated carcinoma.   History reviewed. No pertinent past medical history.:  Past Surgical History:  Procedure Laterality Date  . APPENDECTOMY     teenager  . INGUINAL HERNIA REPAIR Right 2016, 2017   [facility], 2018 [facility]-removed mesh  :  Current Facility-Administered Medications  Medication Dose Route Frequency Provider Last Rate Last Dose  . 0.9 %  sodium chloride infusion   Intravenous Continuous Hosie Poisson, MD   Stopped at 01/23/19 1610  . acetaminophen (TYLENOL) tablet 650 mg  650 mg Oral Q6H PRN Tu, Ching T, DO       Or  . acetaminophen (TYLENOL) suppository 650 mg  650 mg Rectal Q6H PRN Tu, Ching T, DO      . Chlorhexidine Gluconate Cloth 2 % PADS 6 each  6 each Topical Daily Rizwan, Eunice Blase, MD   6 each at 01/24/19 1154  . heparin ADULT infusion 100 units/mL (25000 units/239m sodium chloride 0.45%)  1,500 Units/hr Intravenous Continuous PJoselyn GlassmanA, RPH 15 mL/hr at 01/25/19 0258 1,500 Units/hr at 01/25/19 0258  . HYDROcodone-acetaminophen (NORCO/VICODIN) 5-325 MG per tablet 1-2 tablet  1-2 tablet Oral Q4H PRN HArne Cleveland MD   2 tablet at 01/24/19 1617  . morphine 2 MG/ML injection 1 mg  1 mg Intravenous Q3H PRN AHosie Poisson MD   1 mg at 01/22/19 0157  . mupirocin ointment (BACTROBAN) 2 % 1 application  1 application Nasal BID RDebbe Odea MD   1 application at [id number]/[id number]  . traMADol (ULTRAM) tablet 50 mg  50 mg Oral Q6H PRN Tu, Ching T, DO         Allergies  Allergen Reactions  . Pregabalin Other (See Comments)    Homicidal idealizations, pain in lower extremities    :  Family History  Problem Relation Age of Onset  . Prostate cancer Father   :  Social History   Socioeconomic History  . Marital status: Single    Spouse name: Not on file  . Number of children: 1  . Years of education: GED  . Highest education level:  Not on file  Occupational History    Comment: fork lift driver  Social Needs  . Financial resource strain: Not on file  . Food insecurity    Worry: Not on file    Inability: Not on file  . Transportation needs    Medical: Not on file    Non-medical: Not on file  Tobacco Use  . Smoking status: Current Every Day Smoker    Types: Cigars  . Smokeless tobacco: Never Used  . Tobacco comment: smokes 3 black and mild cigars per day  Substance and Sexual Activity  . Alcohol use: Yes  . Drug use: No  . Sexual activity: Yes  Lifestyle  . Physical activity    Days per week: Not on file    Minutes per session: Not on file  . Stress: Not on file  Relationships  . Social [place] phone: Not on file    Gets together: Not on file    Attends religious service:  Not on file    Active member of club or organization: Not on file    Attends meetings of clubs or organizations: Not on file    Relationship status: Not on file  . Intimate partner violence    Fear of current or ex partner: Not on file    Emotionally abused: Not on file    Physically abused: Not on file    Forced sexual activity: Not on file  Other Topics Concern  . Not on file  Social History Narrative   Lives alone   Caffeine- coffee, 20 oz daily, tea occas  :  Review of Systems: A comprehensive 14 point review of systems was negative except as noted in the HPI.  Exam: Patient Vitals for the past 24 hrs:  BP Temp Temp src Pulse Resp SpO2  01/25/19 0501 99/72 98.5 F (36.9 C) Oral 80 16 93 %  01/24/19 2135 103/69 98.3 F (36.8 C) Oral 76 16 95 %  01/24/19 1630 113/79 97.6 F (36.4 C) Oral 81 20 99 %    General:  Thin male, no acute distress.   Eyes:  no scleral icterus.   ENT:  There were no oropharyngeal lesions.    Lymphatics: Mass palpated in the right anterior cervical region.  Generalized edema to right neck and upper chest.  Respiratory: lungs were clear bilaterally without wheezing or crackles.    Cardiovascular: Prominent vasculature in the neck and upper chest. Regular rate and rhythm, S1/S2, without murmur, rub or gallop.  There was no pedal edema.   GI:  abdomen was soft, flat, nontender, nondistended, without organomegaly.   Musculoskeletal:  no spinal tenderness of palpation of vertebral spine.   Skin exam was without echymosis, petichae.   Neuro exam was nonfocal. Patient was alert and oriented.  Attention was good.   Language was appropriate.  Mood was normal without depression.  Speech was not pressured.  Thought content was not tangential.     Lab Results  Component Value Date   WBC 7.0 01/25/2019   HGB 10.8 (L) 01/25/2019   HCT 34.1 (L) 01/25/2019   PLT 474 (H) 01/25/2019   GLUCOSE 87 01/20/2019   NA 137 01/20/2019   K 4.4 01/20/2019   CL 103 01/20/2019   CREATININE 0.71 01/20/2019   BUN 8 01/20/2019   CO2 21 (L) 01/20/2019    Ct Angio Neck W And/or Wo Contrast  Result Date: 01/20/2019 CLINICAL DATA:  53 year old male with unexplained neck swelling for 3 months. Evidence of right IJ occlusion on thyroid ultrasound earlier today. EXAM: CT ANGIOGRAPHY NECK TECHNIQUE: Multidetector CT imaging of the neck was performed using the standard protocol during bolus administration of intravenous contrast. Multiplanar CT image reconstructions and MIPs were obtained to evaluate the vascular anatomy. Carotid stenosis measurements (when applicable) are obtained utilizing NASCET criteria, using the distal internal carotid diameter as the denominator. CONTRAST:  178m OMNIPAQUE IOHEXOL 350 MG/ML SOLN COMPARISON:  CTA chest reported separately. Thyroid ultrasound earlier today. FINDINGS: Skeleton: No acute or suspicious osseous lesion from the skull base to the upper chest. Mucous retention cyst in the left maxillary sinus. Mild right mastoid effusion. Upper chest: Mediastinal mass/lymphadenopathy, reported separately today. Other neck: Bulky but indistinct soft tissue masses tracking  cephalad in the right neck from the thoracic inlet. Involvement of the right level 4, right level 5, right level 3 and right level 2 nodal stations. Superimposed subcutaneous edema in the region. Estimated individual lymph nodes/soft tissue mass size up to 34  millimeters long axis, 23 millimeters short axis. The level 1 nodal station seems to be spared. The left neck appears relatively spared. Mild leftward mass effect on the trachea and pharynx. The thyroid and pharyngeal soft tissue contours remain within normal limits. The glottis is closed. The parapharyngeal spaces are normal. And superior retropharyngeal space there is edema/effusion in the lower retropharyngeal space. The sublingual, submandibular and parotid spaces appear spared. Grossly negative visible brain parenchyma. Visualized orbit soft tissues are within normal limits. Aortic arch: 3 vessel arch configuration.  No arch atherosclerosis. Right carotid system: Mass effect on the brachiocephalic artery and to a lesser extent the right carotid space from the chest and neck soft tissue masses. No associated arterial stenosis. Negative right carotid bifurcation and cervical right ICA. Visible right ICA siphon is normal. Left carotid system: Negative aside from mild left ICA tortuosity in the neck. Negative visible left ICA siphon. Vertebral arteries: Mild mass effect on the proximal right subclavian artery due to the chest and neck soft tissue masses. No right subclavian stenosis. Normal right vertebral artery origin. Patent and normal right vertebral artery to the vertebrobasilar junction. Negative visible basilar artery. Negative proximal left subclavian artery and left vertebral artery origin. Mildly non dominant left vertebral artery is patent and negative to the vertebrobasilar junction. Other findings: Delayed venous phase images were also obtained. The right sigmoid sinus and right IJ bulb are patent at the skull base, but the right jugular vein is  occluded beginning at the level of the right retromandibular vein, and continuing to the right subclavian/innominate vein confluence. The right innominate vein is occluded in the upper chest on series 19, image 113. The left innominate and left internal jugular vein remain patent. The left sigmoid and transverse sinus are patent. Review of the MIP images confirms the above findings IMPRESSION: 1. Bulky soft tissue masses in the right neck and continuing into the upper chest most compatible with extensive metastatic lymphadenopathy. Individual nodal masses up to 34 mm. 2. Associated thrombosis of the Right IJ, Right Innominate Vein, and likely also the SVC - See Chest CTA reported separately. 3. Mild retropharyngeal effusion likely related to #2. 4. No arterial abnormality in the neck. Electronically Signed   By: Genevie Ann M.D.   On: 01/20/2019 17:18   Mr Jeri Cos GE Contrast  Result Date: 01/23/2019 CLINICAL DATA:  Renal cell cancer, staging. EXAM: MRI HEAD WITHOUT AND WITH CONTRAST TECHNIQUE: Multiplanar, multiecho pulse sequences of the brain and surrounding structures were obtained without and with intravenous contrast. CONTRAST:  27m GADAVIST GADOBUTROL 1 MMOL/ML IV SOLN COMPARISON:  CT angiogram neck 01/20/2019 FINDINGS: Brain: Multiple sequences are significantly motion degraded, limiting evaluation. This includes mild motion degradation of postcontrast imaging. No abnormal intracranial enhancement is identified to suggest intracranial metastatic disease. No evidence of acute infarct. No evidence of intracranial mass. No midline shift or extra-axial fluid collection. No chronic intracranial hemorrhage. A few scattered punctate foci of T2/FLAIR hyperintensity within the cerebral white matter are nonspecific, but may reflect minimal chronic small vessel ischemic disease. Cerebral volume is normal for age. Vascular: Flow voids maintained within the proximal large arterial vessels. Skull and upper cervical spine:  Normal marrow signal. Sinuses/Orbits: Imaged globes and orbits demonstrate no acute abnormality. Complete T2 hyperintense opacification of the left maxillary sinus. Mild scattered mucosal thickening within the remainder of the paranasal sinuses. Right mastoid effusion. IMPRESSION: Motion degraded examination. No evidence of intracranial metastatic disease. A few scattered punctate signal changes within the cerebral  white matter are nonspecific, but may reflect minimal chronic small vessel ischemic disease. Complete T2 hyperintense opacification of the left maxillary sinus, nonspecific but possibly reflecting fluid or a large mucous retention cyst. Right mastoid effusion. Electronically Signed   By: Kellie Simmering   On: 01/23/2019 10:59   Ct Abdomen Pelvis W Contrast  Result Date: 01/21/2019 CLINICAL DATA:  New diagnosis of mediastinal mass. Evaluate for lymphoma or metastatic disease. EXAM: CT ABDOMEN AND PELVIS WITH CONTRAST TECHNIQUE: Multidetector CT imaging of the abdomen and pelvis was performed using the standard protocol following bolus administration of intravenous contrast. CONTRAST:  133m OMNIPAQUE IOHEXOL 300 MG/ML  SOLN COMPARISON:  None. FINDINGS: Lower chest: The unremarkable. Hepatobiliary: Hyperenhancing focus in the anterior left liver likely related to anomalous venous anatomy/flow. Liver otherwise unremarkable. Probable layering sludge in the gallbladder lumen No intrahepatic or extrahepatic biliary dilation. Pancreas: No focal mass lesion. No dilatation of the main duct. No intraparenchymal cyst. No peripancreatic edema. Spleen: No splenomegaly. No focal mass lesion. Adrenals/Urinary Tract: No adrenal nodule or mass. 2 mm nonobstructing stone noted lower pole right kidney. 15 mm subcapsular lesion in the upper pole right kidney has heterogeneous enhancement. Left kidney unremarkable. Ureters not well visualized due to lack of retroperitoneal fat but no hydroureter evident. The urinary bladder  appears normal for the degree of distention. Stomach/Bowel: Stomach is unremarkable. No gastric wall thickening. No evidence of outlet obstruction. No small bowel or colonic dilatation. Vascular/Lymphatic: No abdominal aortic aneurysm. No discernible lymphadenopathy in the abdomen or pelvis. Reproductive: The prostate gland and seminal vesicles are unremarkable. Other: No intraperitoneal free fluid. Musculoskeletal: 11 mm sclerotic focus noted posterior left iliac bone. 10 mm sclerotic focus noted in the posterior right acetabulum. Increased attenuation in lower lumbar vertebral bodies likely enhancement related to opacified venous anatomy. IMPRESSION: 1. 15 mm heterogeneously enhancing lesion in the upper pole right kidney. Renal cell carcinoma a concern. MRI without and with contrast recommended to further evaluate. 2. No lymphadenopathy in the abdomen or pelvis. 3. Focal hyperenhancement in the anterior left liver most likely related to aberrant venous anatomy/flow. 4. Sclerotic lesions in the right acetabulum and left iliac bone are indeterminate. Likely bone islands but attention on follow-up recommended as metastatic cannot be excluded. Electronically Signed   By: EMisty StanleyM.D.   On: 01/21/2019 19:41   Ct Biopsy  Result Date: 01/22/2019 CLINICAL DATA:  Mediastinal mass EXAM: CT GUIDED CORE BIOPSY OF MEDIASTINAL MASS ANESTHESIA/SEDATION: Intravenous Fentanyl 545m and Versed '1mg'$  were administered as conscious sedation during continuous monitoring of the patient?s level of consciousness and physiological / cardiorespiratory status by the radiology RN, with a total moderate sedation time of 16 minutes. PROCEDURE: The procedure risks, benefits, and alternatives were explained to the patient. Questions regarding the procedure were encouraged and answered. The patient understands and consents to the procedure. Select axial scans through the thorax were obtained. The anterior mediastinal mass was localized  and an appropriate skin entry site was identified and marked. The operative field was prepped with chlorhexidinein a sterile fashion, and a sterile drape was applied covering the operative field. A sterile gown and sterile gloves were used for the procedure. Local anesthesia was provided with 1% Lidocaine. Under CT fluoroscopic guidance, a 17 gauge trocar needle was advanced to the margin of the lesion. Once needle tip position was confirmed, coaxial 18-gauge core biopsy samples were obtained, submitted in saline to surgical pathology. The guide needle was removed. Postprocedure scan shows no hematoma, pneumothorax, or other  apparent complication. COMPLICATIONS: None immediate FINDINGS: Anterior mediastinal mass was localized and representative core biopsy samples obtained as above. IMPRESSION: Technically successful CT-guided core biopsy of anterior mediastinal mass. Electronically Signed   By: Lucrezia Europe M.D.   On: 01/22/2019 16:42   Ct Angio Chest Aorta W/cm &/or Wo/cm  Addendum Date: 01/20/2019   ADDENDUM REPORT: 01/20/2019 20:45 ADDENDUM: Clarification to the report. Thrombosis of the right jugular and brachiocephalic veins and probable subclavian vein. Confluence of these vessels is surrounded/occluded by the large mass. There is suspected tumor occlusion/thrombosis of the distal left brachiocephalic vein and upper SVC. Electronically Signed   By: Donavan Foil M.D.   On: 01/20/2019 20:45   Result Date: 01/20/2019 CLINICAL DATA:  Jugular vein thrombosis EXAM: CT ANGIOGRAPHY CHEST WITH CONTRAST TECHNIQUE: Multidetector CT imaging of the chest was performed using the standard protocol during bolus administration of intravenous contrast. Multiplanar CT image reconstructions and MIPs were obtained to evaluate the vascular anatomy. CONTRAST:  129m OMNIPAQUE IOHEXOL 350 MG/ML SOLN COMPARISON:  Thyroid ultrasound 01/20/2019 FINDINGS: Cardiovascular: Satisfactory opacification of the pulmonary arteries to the  segmental level. No evidence of pulmonary embolism. Nonaneurysmal aorta. No dissection. Normal heart size. No significant pericardial effusion. Occluded right jugular and probable subclavian veins. Narrowed appearance of the left brachiocephalic vein as it approaches the venous confluence. Narrowed and likely subtotal occlusion of the upper SVC. Patency of the SVC as it enters the right atrium. Severe narrowing of right upper lobe pulmonary arterial vessels. Numerous collateral vessels within the mediastinum. Mediastinum/Nodes: Midline trachea.  No discrete thyroid nodule. 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 Mass measures approximately 6.2 cm AP by 5.6 cm transverse by 7.9 cm craniocaudad. It is contiguous with additional mass or nodes in the right hilar region. The mass encases/narrows the right upper lobe pulmonary vessels as well as the superior vena cava. Probable tumor invasion of the distal left brachiocephalic vessel and presumed tumor occlusion of the right brachiocephalic and jugular vessels. Partially visualized enlarged right supraclavicular nodes. Generalized edema within the soft tissues of the thoracic inlet and base of right neck. Fluid-filled esophagus with mild distal circumferential esophageal thickening. Lungs/Pleura: Emphysema. Presumed left greater than right apical scarring with left apical calcification. Posterior right upper lobe subpleural nodular focus of airspace disease measuring 13 mm, series 6, image number 55. Irregular focus of airspace disease, also within the right upper lobe abutting the fissure, series 6, image number 79. No pleural effusion or pneumothorax. Upper Abdomen: No acute abnormality. Musculoskeletal: No chest wall abnormality. No acute or significant osseous findings. Review of the MIP images confirms the above findings. IMPRESSION: 1. No acute pulmonary embolus or aortic dissection. 2. Large poorly defined  malignant-appearing infiltrative mass measuring approximately 6.2 x 5.6 x 7.9 cm within the right anterior and middle mediastinum. Suspected invasion of distal left brachiocephalic vein and probable tumor occlusion of the right subclavian and jugular veins. Suspected tumor invasion of the upper SVC with string like narrowing of the SVC, but with patency of the SVC just above the right atrium. Tumor abuts the aorta and great vessels and also results in significant narrowing of right upper lobe pulmonary arterial vessels. Incompletely visualized right supraclavicular adenopathy. Right hilar nodes and or mass. 3. At least 2 foci of somewhat nodular appearing airspace disease in the right upper lobe, either representing pneumonia or foci of neoplasm. 4. Emphysema Emphysema (ICD10-J43.9). Electronically Signed: By: KDonavan FoilM.D. On: 01/20/2019 17:41   [place]yroid  Result Date: 01/20/2019 CLINICAL DATA:  Goiter, neck swelling x3 months EXAM: THYROID ULTRASOUND TECHNIQUE: Ultrasound examination of the thyroid gland and adjacent soft tissues was performed. COMPARISON:  None. FINDINGS: Parenchymal Echotexture: Mildly heterogenous Isthmus: 0.3 cm thickness Right lobe: 6 x 2.1 x 2.3 cm Left lobe: 6 x 2 x 1.8 cm _________________________________________________________ Estimated total number of nodules >/= 1 cm: 0 Number of spongiform nodules >/=  2 cm not described below (TR1): 0 Number of mixed cystic and solid nodules >/= 1.5 cm not described below (TR2): 0 _________________________________________________________ No discrete nodules are seen within the thyroid gland. Suspected echogenic thrombus occluding the right IJ vein. IMPRESSION: 1. Thyromegaly without nodule. 2. 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 The above is in keeping with the ACR TI-RADS recommendations - J Am Coll Radiol 2017;14:587-595. Electronically Signed   By: Lucrezia Europe M.D.   On: 01/20/2019 13:27     Ct Angio Neck W And/or Wo  Contrast  Result Date: 01/20/2019 CLINICAL DATA:  53 year old male with unexplained neck swelling for 3 months. Evidence of right IJ occlusion on thyroid ultrasound earlier today. EXAM: CT ANGIOGRAPHY NECK TECHNIQUE: Multidetector CT imaging of the neck was performed using the standard protocol during bolus administration of intravenous contrast. Multiplanar CT image reconstructions and MIPs were obtained to evaluate the vascular anatomy. Carotid stenosis measurements (when applicable) are obtained utilizing NASCET criteria, using the distal internal carotid diameter as the denominator. CONTRAST:  154m OMNIPAQUE IOHEXOL 350 MG/ML SOLN COMPARISON:  CTA chest reported separately. Thyroid ultrasound earlier today. FINDINGS: Skeleton: No acute or suspicious osseous lesion from the skull base to the upper chest. Mucous retention cyst in the left maxillary sinus. Mild right mastoid effusion. Upper chest: Mediastinal mass/lymphadenopathy, reported separately today. Other neck: Bulky but indistinct soft tissue masses tracking cephalad in the right neck from the thoracic inlet. Involvement of the right level 4, right level 5, right level 3 and right level 2 nodal stations. Superimposed subcutaneous edema in the region. Estimated individual lymph nodes/soft tissue mass size up to 34 millimeters long axis, 23 millimeters short axis. The level 1 nodal station seems to be spared. The left neck appears relatively spared. Mild leftward mass effect on the trachea and pharynx. The thyroid and pharyngeal soft tissue contours remain within normal limits. The glottis is closed. The parapharyngeal spaces are normal. And superior retropharyngeal space there is edema/effusion in the lower retropharyngeal space. The sublingual, submandibular and parotid spaces appear spared. Grossly negative visible brain parenchyma. Visualized orbit soft tissues are within normal limits. Aortic arch: 3 vessel arch configuration.  No arch  atherosclerosis. Right carotid system: Mass effect on the brachiocephalic artery and to a lesser extent the right carotid space from the chest and neck soft tissue masses. No associated arterial stenosis. Negative right carotid bifurcation and cervical right ICA. Visible right ICA siphon is normal. Left carotid system: Negative aside from mild left ICA tortuosity in the neck. Negative visible left ICA siphon. Vertebral arteries: Mild mass effect on the proximal right subclavian artery due to the chest and neck soft tissue masses. No right subclavian stenosis. Normal right vertebral artery origin. Patent and normal right vertebral artery to the vertebrobasilar junction. Negative visible basilar artery. Negative proximal left subclavian artery and left vertebral artery origin. Mildly non dominant left vertebral artery is patent and negative to the vertebrobasilar junction. Other findings: Delayed venous phase images were also obtained. The right sigmoid sinus and right IJ bulb are patent at the skull base, but the  right jugular vein is occluded beginning at the level of the right retromandibular vein, and continuing to the right subclavian/innominate vein confluence. The right innominate vein is occluded in the upper chest on series 19, image 113. The left innominate and left internal jugular vein remain patent. The left sigmoid and transverse sinus are patent. Review of the MIP images confirms the above findings IMPRESSION: 1. Bulky soft tissue masses in the right neck and continuing into the upper chest most compatible with extensive metastatic lymphadenopathy. Individual nodal masses up to 34 mm. 2. Associated thrombosis of the Right IJ, Right Innominate Vein, and likely also the SVC - See Chest CTA reported separately. 3. Mild retropharyngeal effusion likely related to #2. 4. No arterial abnormality in the neck. Electronically Signed   By: Genevie Ann M.D.   On: 01/20/2019 17:18   Mr Jeri Cos BH Contrast  Result  Date: 01/23/2019 CLINICAL DATA:  Renal cell cancer, staging. EXAM: MRI HEAD WITHOUT AND WITH CONTRAST TECHNIQUE: Multiplanar, multiecho pulse sequences of the brain and surrounding structures were obtained without and with intravenous contrast. CONTRAST:  36m GADAVIST GADOBUTROL 1 MMOL/ML IV SOLN COMPARISON:  CT angiogram neck 01/20/2019 FINDINGS: Brain: Multiple sequences are significantly motion degraded, limiting evaluation. This includes mild motion degradation of postcontrast imaging. No abnormal intracranial enhancement is identified to suggest intracranial metastatic disease. No evidence of acute infarct. No evidence of intracranial mass. No midline shift or extra-axial fluid collection. No chronic intracranial hemorrhage. A few scattered punctate foci of T2/FLAIR hyperintensity within the cerebral white matter are nonspecific, but may reflect minimal chronic small vessel ischemic disease. Cerebral volume is normal for age. Vascular: Flow voids maintained within the proximal large arterial vessels. Skull and upper cervical spine: Normal marrow signal. Sinuses/Orbits: Imaged globes and orbits demonstrate no acute abnormality. Complete T2 hyperintense opacification of the left maxillary sinus. Mild scattered mucosal thickening within the remainder of the paranasal sinuses. Right mastoid effusion. IMPRESSION: Motion degraded examination. No evidence of intracranial metastatic disease. A few scattered punctate signal changes within the cerebral white matter are nonspecific, but may reflect minimal chronic small vessel ischemic disease. Complete T2 hyperintense opacification of the left maxillary sinus, nonspecific but possibly reflecting fluid or a large mucous retention cyst. Right mastoid effusion. Electronically Signed   By: KKellie Simmering  On: 01/23/2019 10:59   Ct Abdomen Pelvis W Contrast  Result Date: 01/21/2019 CLINICAL DATA:  New diagnosis of mediastinal mass. Evaluate for lymphoma or metastatic  disease. EXAM: CT ABDOMEN AND PELVIS WITH CONTRAST TECHNIQUE: Multidetector CT imaging of the abdomen and pelvis was performed using the standard protocol following bolus administration of intravenous contrast. CONTRAST:  1056mOMNIPAQUE IOHEXOL 300 MG/ML  SOLN COMPARISON:  None. FINDINGS: Lower chest: The unremarkable. Hepatobiliary: Hyperenhancing focus in the anterior left liver likely related to anomalous venous anatomy/flow. Liver otherwise unremarkable. Probable layering sludge in the gallbladder lumen No intrahepatic or extrahepatic biliary dilation. Pancreas: No focal mass lesion. No dilatation of the main duct. No intraparenchymal cyst. No peripancreatic edema. Spleen: No splenomegaly. No focal mass lesion. Adrenals/Urinary Tract: No adrenal nodule or mass. 2 mm nonobstructing stone noted lower pole right kidney. 15 mm subcapsular lesion in the upper pole right kidney has heterogeneous enhancement. Left kidney unremarkable. Ureters not well visualized due to lack of retroperitoneal fat but no hydroureter evident. The urinary bladder appears normal for the degree of distention. Stomach/Bowel: Stomach is unremarkable. No gastric wall thickening. No evidence of outlet obstruction. No small bowel or colonic dilatation.  Vascular/Lymphatic: No abdominal aortic aneurysm. No discernible lymphadenopathy in the abdomen or pelvis. Reproductive: The prostate gland and seminal vesicles are unremarkable. Other: No intraperitoneal free fluid. Musculoskeletal: 11 mm sclerotic focus noted posterior left iliac bone. 10 mm sclerotic focus noted in the posterior right acetabulum. Increased attenuation in lower lumbar vertebral bodies likely enhancement related to opacified venous anatomy. IMPRESSION: 1. 15 mm heterogeneously enhancing lesion in the upper pole right kidney. Renal cell carcinoma a concern. MRI without and with contrast recommended to further evaluate. 2. No lymphadenopathy in the abdomen or pelvis. 3. Focal  hyperenhancement in the anterior left liver most likely related to aberrant venous anatomy/flow. 4. Sclerotic lesions in the right acetabulum and left iliac bone are indeterminate. Likely bone islands but attention on follow-up recommended as metastatic cannot be excluded. Electronically Signed   By: Misty Stanley M.D.   On: 01/21/2019 19:41   Ct Biopsy  Result Date: 01/22/2019 CLINICAL DATA:  Mediastinal mass EXAM: CT GUIDED CORE BIOPSY OF MEDIASTINAL MASS ANESTHESIA/SEDATION: Intravenous Fentanyl 75mg and Versed '1mg'$  were administered as conscious sedation during continuous monitoring of the patient?s level of consciousness and physiological / cardiorespiratory status by the radiology RN, with a total moderate sedation time of 16 minutes. PROCEDURE: The procedure risks, benefits, and alternatives were explained to the patient. Questions regarding the procedure were encouraged and answered. The patient understands and consents to the procedure. Select axial scans through the thorax were obtained. The anterior mediastinal mass was localized and an appropriate skin entry site was identified and marked. The operative field was prepped with chlorhexidinein a sterile fashion, and a sterile drape was applied covering the operative field. A sterile gown and sterile gloves were used for the procedure. Local anesthesia was provided with 1% Lidocaine. Under CT fluoroscopic guidance, a 17 gauge trocar needle was advanced to the margin of the lesion. Once needle tip position was confirmed, coaxial 18-gauge core biopsy samples were obtained, submitted in saline to surgical pathology. The guide needle was removed. Postprocedure scan shows no hematoma, pneumothorax, or other apparent complication. COMPLICATIONS: None immediate FINDINGS: Anterior mediastinal mass was localized and representative core biopsy samples obtained as above. IMPRESSION: Technically successful CT-guided core biopsy of anterior mediastinal mass.  Electronically Signed   By: DLucrezia EuropeM.D.   On: 01/22/2019 16:42   Ct Angio Chest Aorta W/cm &/or Wo/cm  Addendum Date: 01/20/2019   ADDENDUM REPORT: 01/20/2019 20:45 ADDENDUM: Clarification to the report. Thrombosis of the right jugular and brachiocephalic veins and probable subclavian vein. Confluence of these vessels is surrounded/occluded by the large mass. There is suspected tumor occlusion/thrombosis of the distal left brachiocephalic vein and upper SVC. Electronically Signed   By: KDonavan FoilM.D.   On: 01/20/2019 20:45   Result Date: 01/20/2019 CLINICAL DATA:  Jugular vein thrombosis EXAM: CT ANGIOGRAPHY CHEST WITH CONTRAST TECHNIQUE: Multidetector CT imaging of the chest was performed using the standard protocol during bolus administration of intravenous contrast. Multiplanar CT image reconstructions and MIPs were obtained to evaluate the vascular anatomy. CONTRAST:  1069mOMNIPAQUE IOHEXOL 350 MG/ML SOLN COMPARISON:  Thyroid ultrasound 01/20/2019 FINDINGS: Cardiovascular: Satisfactory opacification of the pulmonary arteries to the segmental level. No evidence of pulmonary embolism. Nonaneurysmal aorta. No dissection. Normal heart size. No significant pericardial effusion. Occluded right jugular and probable subclavian veins. Narrowed appearance of the left brachiocephalic vein as it approaches the venous confluence. Narrowed and likely subtotal occlusion of the upper SVC. Patency of the SVC as it enters the right atrium. Severe narrowing  of right upper lobe pulmonary arterial vessels. Numerous collateral vessels within the mediastinum. Mediastinum/Nodes: Midline trachea.  No discrete thyroid nodule. 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 Mass measures approximately 6.2 cm AP by 5.6 cm transverse by 7.9 cm craniocaudad. It is contiguous with additional mass or nodes in the right hilar region. The mass encases/narrows the right upper lobe  pulmonary vessels as well as the superior vena cava. Probable tumor invasion of the distal left brachiocephalic vessel and presumed tumor occlusion of the right brachiocephalic and jugular vessels. Partially visualized enlarged right supraclavicular nodes. Generalized edema within the soft tissues of the thoracic inlet and base of right neck. Fluid-filled esophagus with mild distal circumferential esophageal thickening. Lungs/Pleura: Emphysema. Presumed left greater than right apical scarring with left apical calcification. Posterior right upper lobe subpleural nodular focus of airspace disease measuring 13 mm, series 6, image number 55. Irregular focus of airspace disease, also within the right upper lobe abutting the fissure, series 6, image number 79. No pleural effusion or pneumothorax. Upper Abdomen: No acute abnormality. Musculoskeletal: No chest wall abnormality. No acute or significant osseous findings. Review of the MIP images confirms the above findings. IMPRESSION: 1. No acute pulmonary embolus or aortic dissection. 2. Large poorly defined malignant-appearing infiltrative mass measuring approximately 6.2 x 5.6 x 7.9 cm within the right anterior and middle mediastinum. Suspected invasion of distal left brachiocephalic vein and probable tumor occlusion of the right subclavian and jugular veins. Suspected tumor invasion of the upper SVC with string like narrowing of the SVC, but with patency of the SVC just above the right atrium. Tumor abuts the aorta and great vessels and also results in significant narrowing of right upper lobe pulmonary arterial vessels. Incompletely visualized right supraclavicular adenopathy. Right hilar nodes and or mass. 3. At least 2 foci of somewhat nodular appearing airspace disease in the right upper lobe, either representing pneumonia or foci of neoplasm. 4. Emphysema Emphysema (ICD10-J43.9). Electronically Signed: By: Donavan Foil M.D. On: 01/20/2019 17:41   US  Thyroid  Result Date: 01/20/2019 CLINICAL DATA:  Goiter, neck swelling x3 months EXAM: THYROID ULTRASOUND TECHNIQUE: Ultrasound examination of the thyroid gland and adjacent soft tissues was performed. COMPARISON:  None. FINDINGS: Parenchymal Echotexture: Mildly heterogenous Isthmus: 0.3 cm thickness Right lobe: 6 x 2.1 x 2.3 cm Left lobe: 6 x 2 x 1.8 cm _________________________________________________________ Estimated total number of nodules >/= 1 cm: 0 Number of spongiform nodules >/=  2 cm not described below (TR1): 0 Number of mixed cystic and solid nodules >/= 1.5 cm not described below (TR2): 0 _________________________________________________________ No discrete nodules are seen within the thyroid gland. Suspected echogenic thrombus occluding the right IJ vein. IMPRESSION: 1. Thyromegaly without nodule. 2. 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 The above is in keeping with the ACR TI-RADS recommendations - J Am Coll Radiol 2017;14:587-595. Electronically Signed   By: Lucrezia Europe M.D.   On: 01/20/2019 13:27    Pathology:  Biopsy results of Mediastinal mass suggestive of poorly differentiated carcinoma of lung or thyroid- further staining to be done today.  Assessment and Plan:  This is a 53 year old male with  1.  Large neck mass, mediastinal mass, renal mass, and questionable bone lesions -preliminary biopsy shows poorly differentiated carcinoma of either the lung or thyroid with further stains pending. -I discussed the imaging findings and the preliminary biopsy results with the patient.  He is aware that we are waiting on additional information to confirm the type  of cancer -We discussed that based on the extent of his disease, that his cancer is not curable but is treatable. -Further recommendations regarding chemotherapy pending final biopsy result.  2.  Impending SVC syndrome -Discussed with radiation oncology who plans to review the chart and consult on the patient.  3.  Right  internal jugular vein thromboembolism -On IV heparin -We will need to transition the patient over to either subcu Lovenox or DOAC prior to discharge.  4. Normocytic anemia -Likely due to underlying malignancy -Will check ferritin, iron studies, vitamin B12 level, and RBC folate in the a.m.  5.  Nicotine dependence -He was counseled about tobacco cessation.    Thank you for this referral.   David Bussing, DNP, AGPCNP-BC, AOCNP  ADDENDUM   .Patient was Personally and independently interviewed, examined and relevant elements of the history of present illness were reviewed in details and an assessment and plan was created. All elements of the patient's history of present illness , assessment and plan were discussed in details with David Bussing, DNP. The above documentation reflects our combined findings assessment and plan.  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 -given significant risk of systemic progression and to enhance RT effect will consider starting on carboplatin/alimta chemotherapy. Preferably as outpatient. -pain mx per hospitalst. -discussed with patient that treatment is palliative and not curative.   David Lone MD MS

## 2019-01-25 NOTE — Progress Notes (Addendum)
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 The pain started in July. He was having pain in his right wrist, up his arm to his neck and to his chest. He was diagnosed with carpal tunnel and then given a steroid injection in early August after which the right sided of he neck became swollen and more painful. He was referred by his orthopedic doctor to Dr Nelva Bush for his neck issues who ordered an MRI which was scheduled for 9/19. The patient decided to come to the ED where he had imaging done for below pictured swelling.     9/12:   Thyroid ultrasound:    Thyromegaly without nodule,  Suspected right IJ vein occlusive thrombosis.  Heparin started.   CTA neck: 1. Bulky soft tissue masses in the right neck and continuing into the upper chest most compatible with extensive metastatic lymphadenopathy. Individual nodal masses up to 34 mm. 2. Associated thrombosis of the Right IJ, Right Innominate Vein, and likely also the SVC - See Chest CTA reported separately. 3. Mild retropharyngeal effusion likely related to #2.  CTA chest: 1. No acute pulmonary embolus or aortic dissection. 2. Large poorly defined malignant-appearing infiltrative mass measuring approximately 6.2 x 5.6 x 7.9 cm within the right anterior and middle mediastinum. Suspected invasion of distal left brachiocephalic vein and probable tumor occlusion of the right subclavian and jugular veins. Suspected tumor invasion of the upper SVC with string like narrowing of the SVC, but with patency of the SVC just above the right atrium. Tumor abuts the aorta and great vessels and also results in significant narrowing of right upper lobe pulmonary  arterial vessels. Incompletely visualized right supraclavicular adenopathy. Right hilar nodes and or mass. 3. At least 2 foci of somewhat nodular appearing airspace disease in the right upper lobe, either representing pneumonia or foci of neoplasm. 4. Emphysema  9/13: CT abd/pelvis 1. 15 mm heterogeneously enhancing lesion in the upper pole right kidney. Renal cell carcinoma a concern. MRI without and with contrast recommended to further evaluate. 2. No lymphadenopathy in the abdomen or pelvis. 3. Focal hyperenhancement in the anterior left liver most likely related to aberrant venous anatomy/flow. 4. Sclerotic lesions in the right acetabulum and left iliac bone are indeterminate. Likely bone islands but attention on follow-up recommended as metastatic cannot be excluded.  Consult with CT surgery: Dr Cyndia Bent recommended an ENT consult Zosyn started  9/14: Consult with ENT: Dr Blenda Nicely recommended Pulm or IR consult for biopsy Consult with Pulm: Dr Carlis Abbott recommends IR biopsy and probable need for urgent radiation of neck mass He underwent a CT guided biopsy of anterior mediastinal mass by IR, Dr Jarvis Newcomer  9/15 MRI brain: no masses    Subjective:  He still wants to go home. I have explained the prelim biopsy results to him. I have also told him that the oncologist will see him today. He is tearful today and wishes this would have been discovered earlier when he first complained of neck pain. He is thankful for the care he is receiving.   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  - Oncology to give more input on whether  radiation should be done                - pain is controlled currently with Norco  Active Problems:     Internal jugular (IJ) vein thromboembolism, acute, right (HCC) CTA> " thrombosis of the Right IJ, Right Innominate Vein, and likely also the SVC"  - therapeutic on Heparin infusion which I will continue- once results are obtained from biopsy, will need to decide on whether he needs another procedure (ie. a port) or if we can switch to long acting medications  ? Of nodules in RUL suggestive of pneumonia or cancer - d/c Zosyn on 9/16 as he has no cough, fever or leukocytosis- clinically there is no pneumonia  Dysphagia- chronic - D3 diet ordered bySLP Nicotine abuse - counseled  - will need PFTs as outpt- imaging above suggestive of emphysema  Thyromegaly - TSH normal at 2.020- outpt surveillance needed   Time spent in minutes: 6  In speaking with consultants and patient DVT prophylaxis: Heparin infusion Code Status: full code Family Communication: none Disposition Plan: awaiting final path results and onc eval Consultants:   Ct surgery  ENT  Pulm  IR  Phone conversation with oncology Procedures:   2 D ECHO 1. The left ventricle has low normal systolic function, with an ejection fraction of 50-55%. The cavity size was normal. Left ventricular diastolic Doppler parameters are consistent with impaired relaxation.  2. The right ventricle has normal systolic function. The cavity was normal. There is no increase in right ventricular wall thickness.  3. The mitral valve is degenerative. Moderate thickening of the mitral valve leaflet.  4. The aortic valve is tricuspid.  5. The aorta is normal unless otherwise noted.  6. No intracardiac thrombi or masses were visualized.  Antimicrobials:  Anti-infectives (From admission, onward)   Start     Dose/Rate Route Frequency Ordered Stop   01/21/19 1430  piperacillin-tazobactam (ZOSYN) IVPB 3.375 g  Status:  Discontinued      3.375 g 12.5 mL/hr over 240 Minutes Intravenous Every 8 hours 01/21/19 1415 01/24/19 0914       Objective: Vitals:   01/24/19 0504 01/24/19 1630 01/24/19 2135 01/25/19 0501  BP: 92/68 113/79 103/69 99/72  Pulse: (!) 57 81 76 80  Resp:  20 16 16   Temp: 97.7 F (36.5 C) 97.6 F (36.4 C) 98.3 F (36.8 C) 98.5 F (36.9 C)  TempSrc: Oral Oral Oral Oral  SpO2: 100% 99% 95% 93%  Weight:      Height:        Intake/Output Summary (Last 24 hours) at 01/25/2019 1144 Last data filed at 01/24/2019 1500 Gross per 24 hour  Intake 420 ml  Output -  Net 420 ml   Filed Weights   01/20/19 2000 01/20/19 2100  Weight: 52.2 kg 52 kg    Examination: General exam: Appears comfortable  HEENT: PERRLA, oral mucosa moist, no sclera icterus or thrush- mild swelling and tenderness in right lateral neck Respiratory system: Clear to auscultation. Respiratory effort normal. Cardiovascular system: S1 & S2 heard,  No murmurs  Gastrointestinal system: Abdomen soft, non-tender, nondistended.  Normal bowel sounds   Central nervous system: Alert and oriented. No focal neurological deficits. Extremities: No cyanosis, clubbing or edema Skin: No rashes or ulcers Psychiatry:  depressed     Data Reviewed: I have personally reviewed following labs and imaging studies  CBC: Recent Labs  Lab 01/20/19 1157 01/21/19 0251 01/22/19 0800 01/23/19 0552 01/24/19 0151 01/25/19 0122  WBC 9.0 7.4 6.7 5.3 6.1 7.0  NEUTROABS 6.0  --   --   --   --   --   HGB 12.0* 11.4* 11.9* 11.3* 10.9* 10.8*  HCT 37.4* 35.6* 37.1* 34.7* 33.1* 34.1*  MCV 85.0 84.0 85.1 83.6 83.4 84.0  PLT 382 509* 530* 523* 482* 656*   Basic Metabolic Panel: Recent Labs  Lab 01/20/19 1157  NA 137  K 4.4  CL 103  CO2 21*  GLUCOSE 87  BUN 8  CREATININE 0.71  CALCIUM 10.0   GFR: Estimated Creatinine Clearance: 79.4 mL/min (by C-G formula based on SCr of 0.71 mg/dL). Liver Function Tests: No results for input(s): AST, ALT, ALKPHOS,  BILITOT, PROT, ALBUMIN in the last 168 hours. No results for input(s): LIPASE, AMYLASE in the last 168 hours. No results for input(s): AMMONIA in the last 168 hours. Coagulation Profile: Recent Labs  Lab 01/20/19 1357  INR 1.2   Cardiac Enzymes: No results for input(s): CKTOTAL, CKMB, CKMBINDEX, TROPONINI in the last 168 hours. BNP (last 3 results) No results for input(s): PROBNP in the last 8760 hours. HbA1C: No results for input(s): HGBA1C in the last 72 hours. CBG: No results for input(s): GLUCAP in the last 168 hours. Lipid Profile: No results for input(s): CHOL, HDL, LDLCALC, TRIG, CHOLHDL, LDLDIRECT in the last 72 hours. Thyroid Function Tests: No results for input(s): TSH, T4TOTAL, FREET4, T3FREE, THYROIDAB in the last 72 hours. Anemia Panel: No results for input(s): VITAMINB12, FOLATE, FERRITIN, TIBC, IRON, RETICCTPCT in the last 72 hours. Urine analysis: No results found for: COLORURINE, APPEARANCEUR, LABSPEC, PHURINE, GLUCOSEU, HGBUR, BILIRUBINUR, KETONESUR, PROTEINUR, UROBILINOGEN, NITRITE, LEUKOCYTESUR Sepsis Labs: @LABRCNTIP (procalcitonin:4,lacticidven:4) ) Recent Results (from the past 240 hour(s))  SARS Coronavirus 2 [facility] order, Performed in [facility] hospital lab) Nasopharyngeal Nasopharyngeal Swab     Status: None   Collection Time: 01/20/19  6:30 PM   Specimen: Nasopharyngeal Swab  Result Value Ref Range Status   SARS Coronavirus 2 NEGATIVE NEGATIVE Final    Comment: (NOTE) If result is NEGATIVE SARS-CoV-2 target nucleic acids are NOT DETECTED. The SARS-CoV-2 RNA is generally detectable in upper and lower  respiratory specimens during the acute phase of infection. The lowest  concentration of SARS-CoV-2 viral copies this assay can detect is 250  copies / mL. A negative result does not preclude SARS-CoV-2 infection  and should not be used as the sole basis for treatment or other  patient management decisions.  A negative result may occur with   improper specimen collection / handling, submission of specimen other  than nasopharyngeal swab, presence of viral mutation(s) within the  areas targeted by this assay, and inadequate number of viral copies  (<250 copies / mL). A negative result must be combined with clinical  observations, patient history, and epidemiological information. If result is POSITIVE SARS-CoV-2 target nucleic acids are DETECTED. The SARS-CoV-2 RNA is generally detectable in upper and lower  respiratory specimens dur ing the acute phase of infection.  Positive  results are indicative of active infection with SARS-CoV-2.  Clinical  correlation with patient history and other diagnostic information is  necessary to determine  patient infection status.  Positive results do  not rule out bacterial infection or co-infection with other viruses. If result is PRESUMPTIVE POSTIVE SARS-CoV-2 nucleic acids MAY BE PRESENT.   A presumptive positive result was obtained on the submitted specimen  and confirmed on repeat testing.  While 2019 novel coronavirus  (SARS-CoV-2) nucleic acids may be present in the submitted sample  additional confirmatory testing may be necessary for epidemiological  and / or clinical management purposes  to differentiate between  SARS-CoV-2 and other Sarbecovirus currently known to infect humans.  If clinically indicated additional testing with an alternate test  methodology [phone]) is advised. The SARS-CoV-2 RNA is generally  detectable in upper and lower respiratory sp ecimens during the acute  phase of infection. The expected result is Negative. Fact Sheet for Patients:  StrictlyIdeas.no Fact Sheet for Healthcare Providers: BankingDealers.co.za This test is not yet approved or cleared by the [place] FDA and has been authorized for detection and/or diagnosis of SARS-CoV-2 by FDA under an Emergency Use Authorization (EUA).  This EUA will  remain in effect (meaning this test can be used) for the duration of the COVID-19 declaration under Section 564(b)(1) of the Act, 21 U.S.C. section 360bbb-3(b)(1), unless the authorization is terminated or revoked sooner. Performed at [facility], [place] [address] [place], [place] [address]   Surgical PCR screen     Status: Abnormal   Collection Time: 01/22/19  6:01 AM   Specimen: Nasal Mucosa; Nasal Swab  Result Value Ref Range Status   MRSA, PCR NEGATIVE NEGATIVE Final   Staphylococcus aureus POSITIVE (A) NEGATIVE Final    Comment: (NOTE) The Xpert SA Assay (FDA approved for NASAL specimens in patients 38 years of age and older), is one component of a comprehensive surveillance program. It is not intended to diagnose infection nor to guide or monitor treatment. Performed at [facility], [place] [address], [place], [place] [address]          Radiology Studies: No results found.    Scheduled Meds: . Chlorhexidine Gluconate Cloth  6 each Topical Daily  . mupirocin ointment  1 application Nasal BID   Continuous Infusions: . sodium chloride Stopped (01/23/19 1610)  . heparin 1,500 Units/hr (01/25/19 0258)     LOS: 5 days      Debbe Odea, MD [facility] Pager: [email or web address] Password [facility] 01/25/2019, 11:44 AM

## 2019-01-25 NOTE — Consult Note (Signed)
Radiation Oncology         [phone] ________________________________  Initial inpatient Consultation - Conducted via telephone due to current COVID-19 concerns for limiting patient exposure  Name: David Berry MRN: [id number]  Date of Service: 01/20/2019 DOB: Jul 03, 1965  [place], No Pcp Per  No ref. provider found   REFERRING PHYSICIAN: No ref. provider found  DIAGNOSIS: 53 y.o. 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    ICD-10-CM   1. Mass of thoracic structure  R22.2   2. Goiter  E04.9 US THYROID    US THYROID  3. 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 male seen at the request of Dr. Irene Limbo.  He presented to the emergency department on 01/20/2019 with complaints of progressive right arm pain and swelling with radiation into the chest ongoing for approximately 2 weeks prior to admission.  Additionally, he developed right sided neck swelling and intermittent facial swelling persistent for 1 week prior to admission.  At the time of admission he had a CT angiogram of the neck and chest which revealed a bulky soft tissue mass in the right neck continuing into the upper chest, most compatible with extensive metastatic lymphadenopathy.  The mass measures up to 34 mm and encases multiple vessels including the right upper lobe pulmonary vessels, superior vena cava and brachiocephalic vessel.  He also had a large, poorly defined malignant appearing infiltrative mass measuring 6.2 x 5.6 x 7.9 cm within the right anterior and medial mediastinum with suspected invasion of the distal left brachiocephalic vein and probable tumor occlusion of the right subclavian and jugular veins.  There is also suspected tumor invasion of the upper SVC with string-like narrowing of  the SVC but with patency of the SVC just above the right atrium.  The tumor abuts the aorta and great vessels and also results in significant narrowing of the right upper lobe pulmonary arterial vessels.  A CT A/P was performed on 01/21/2019 and demonstrated a 15 mm heterogeneously enhancing lesion in the upper pole of the right kidney, concerning for renal cell carcinoma.  There was no lymphadenopathy in the abdomen or pelvis but there was focal hyperenhancement of the anterior left liver, most likely related to aberrant venous anatomy/flow.  Additionally, there were sclerotic lesions in the right acetabulum and left iliac bone which were indeterminate and felt most likely to be benign bone islands.  An MRI of the brain was also performed on 01/23/2019 and did not show any evidence of intracranial metastatic disease.  The patient underwent CT-guided core needle biopsy of the anterior mediastinal mass and final pathology remains pending but preliminary path reveals poorly differentiated carcinoma.  Additional stains are pending to determine the primary source which is felt to be most likely, either lung or thyroid.  Radiation oncology has been consulted for consideration of urgent palliative radiotherapy to the right neck mass for management of the SVC syndrome.  PREVIOUS RADIATION THERAPY: No  PAST MEDICAL HISTORY: History reviewed. No pertinent past medical history.    PAST SURGICAL HISTORY: Past Surgical History:  Procedure Laterality Date   APPENDECTOMY     teenager   INGUINAL HERNIA REPAIR Right 2016, 2017   [facility], 2018 [facility]-removed mesh  FAMILY HISTORY:  Family History  Problem Relation Age of Onset   Prostate cancer Father     SOCIAL HISTORY:  Social History   Socioeconomic History   Marital status: Single    Spouse name: Not on file   Number of children: 1   Years of education: GED   Highest education level: Not on file  Occupational History     Comment: [employer]  Social Needs   Financial resource strain: Not on file   Food insecurity    Worry: Not on file    Inability: Not on file   Transportation needs    Medical: Not on file    Non-medical: Not on file  Tobacco Use   Smoking status: Current Every Day Smoker    Types: Cigars   Smokeless tobacco: Never Used   Tobacco comment: smokes 3 black and mild cigars per day  Substance and Sexual Activity   Alcohol use: Yes   Drug use: No   Sexual activity: Yes  Lifestyle   Physical activity    Days per week: Not on file    Minutes per session: Not on file   Stress: Not on file  Relationships   Social connections    Talks on phone: Not on file    Gets together: Not on file    Attends religious service: Not on file    Active member of club or organization: Not on file    Attends meetings of clubs or organizations: Not on file    Relationship status: Not on file   Intimate partner violence    Fear of current or ex partner: Not on file    Emotionally abused: Not on file    Physically abused: Not on file    Forced sexual activity: Not on file  Other Topics Concern   Not on file  Social History Narrative   Lives alone   Caffeine- coffee, 20 oz daily, tea occas    ALLERGIES: Pregabalin  MEDICATIONS:  Current Facility-Administered Medications  Medication Dose Route Frequency Provider Last Rate Last Dose   0.9 %  sodium chloride infusion   Intravenous Continuous Hosie Poisson, MD   Stopped at 01/23/19 1610   acetaminophen (TYLENOL) tablet 650 mg  650 mg Oral Q6H PRN Tu, Ching T, DO       Or   acetaminophen (TYLENOL) suppository 650 mg  650 mg Rectal Q6H PRN Tu, Ching T, DO       Chlorhexidine Gluconate Cloth 2 % PADS 6 each  6 each Topical Daily Rizwan, Eunice Blase, MD   6 each at 01/24/19 1154   feeding supplement (ENSURE ENLIVE) (ENSURE ENLIVE) liquid 237 mL  237 mL Oral TID BM Rizwan, Saima, MD       heparin ADULT infusion 100 units/mL (25000  units/268mL sodium chloride 0.45%)  1,500 Units/hr Intravenous Continuous Joselyn Glassman A, RPH 15 mL/hr at 01/25/19 1917 1,500 Units/hr at 01/25/19 1917   HYDROcodone-acetaminophen (NORCO/VICODIN) 5-325 MG per tablet 1-2 tablet  1-2 tablet Oral Q4H PRN Arne Cleveland, MD   2 tablet at 01/25/19 1916   morphine 2 MG/ML injection 1 mg  1 mg Intravenous Q3H PRN Hosie Poisson, MD   1 mg at 01/22/19 0157   multivitamin with minerals tablet 1 tablet  1 tablet Oral Daily Rizwan, Eunice Blase, MD       mupirocin ointment (BACTROBAN) 2 % 1 application  1 application Nasal BID Debbe Odea, MD   1 application at 07/37/10 1115  traMADol (ULTRAM) tablet 50 mg  50 mg Oral Q6H PRN Tu, Ching T, DO        REVIEW OF SYSTEMS:  On review of systems, the patient reports that he is doing well overall.  He has persistent pain and swelling in the right arm and right side of the neck radiating into the right chest wall.  He also continues with intermittent facial swelling which is more noticeable in the mornings and improves throughout the day, once he is upright.  He denies any shortness of breath, cough, fevers, chills, or night sweats.  He has noted an unintentional 20 pound weight loss over the past 3 to 4 weeks which he attributes to decreased appetite.  He denies any difficulty swallowing but reports a "tightness" in his throat.  He denies any bowel or bladder disturbances, and denies abdominal pain, nausea or vomiting.  He denies any new musculoskeletal or joint aches or pains. A complete review of systems is obtained and is otherwise negative.    PHYSICAL EXAM:  Wt Readings from Last 3 Encounters:  01/20/19 114 lb 10.2 oz (52 kg)  07/10/18 131 lb 3.2 oz (59.5 kg)  06/14/12 135 lb (61.2 kg)   Temp Readings from Last 3 Encounters:  01/25/19 98.4 F (36.9 C) (Oral)  07/08/18 98.7 F (37.1 C) (Oral)  05/05/18 97.9 F (36.6 C) (Oral)   BP Readings from Last 3 Encounters:  01/25/19 125/75  07/10/18 100/67    07/08/18 (!) 142/83   Pulse Readings from Last 3 Encounters:  01/25/19 96  07/10/18 83  07/08/18 74   Pain Assessment Pain Score: 0-No pain/10 As per medical oncology earlier today: General:  Thin male, no acute distress.   Eyes:  no scleral icterus.   ENT:  There were no oropharyngeal lesions.    Lymphatics: Mass palpated in the right anterior cervical region.  Generalized edema to right neck and upper chest. (see photo below) Respiratory: lungs were clear bilaterally without wheezing or crackles.   Cardiovascular: Prominent vasculature in the neck and upper chest. Regular rate and rhythm, S1/S2, without murmur, rub or gallop.  There was no pedal edema.   GI:  abdomen was soft, flat, nontender, nondistended, without organomegaly.   Musculoskeletal:  no spinal tenderness of palpation of vertebral spine.   Skin exam was without echymosis, petichae.   Neuro exam was nonfocal. Patient was alert and oriented.  Attention was good.   Language was appropriate.  Mood was normal without depression.  Speech was not pressured.  Thought content was not tangential.     KPS = 70  100 - Normal; no complaints; no evidence of disease. 90   - Able to carry on normal activity; minor signs or symptoms of disease. 80   - Normal activity with effort; some signs or symptoms of disease. 35   - Cares for self; unable to carry on normal activity or to do active work. 60   - Requires occasional assistance, but is able to care for most of his personal needs. 50   - Requires considerable assistance and frequent medical care. 17   - Disabled; requires special care and assistance. 55   - Severely disabled; hospital admission is indicated although death not imminent. 74   - Very sick; hospital admission necessary; active supportive treatment necessary. 10   - Moribund; fatal processes progressing rapidly. 0     - Dead  Karnofsky DA, Abelmann WH, Craver LS and Burchenal [facility] [phone]) The use of the  nitrogen  mustards in the palliative treatment of carcinoma: with particular reference to bronchogenic carcinoma Cancer 1 634-56  LABORATORY DATA:  Lab Results  Component Value Date   WBC 7.0 01/25/2019   HGB 10.8 (L) 01/25/2019   HCT 34.1 (L) 01/25/2019   MCV 84.0 01/25/2019   PLT 474 (H) 01/25/2019   Lab Results  Component Value Date   NA 137 01/20/2019   K 4.4 01/20/2019   CL 103 01/20/2019   CO2 21 (L) 01/20/2019   No results found for: ALT, AST, GGT, ALKPHOS, BILITOT   RADIOGRAPHY: Ct Angio Neck W And/or Wo Contrast  Result Date: 01/20/2019 CLINICAL DATA:  53 year old male with unexplained neck swelling for 3 months. Evidence of right IJ occlusion on thyroid ultrasound earlier today. EXAM: CT ANGIOGRAPHY NECK TECHNIQUE: Multidetector CT imaging of the neck was performed using the standard protocol during bolus administration of intravenous contrast. Multiplanar CT image reconstructions and MIPs were obtained to evaluate the vascular anatomy. Carotid stenosis measurements (when applicable) are obtained utilizing NASCET criteria, using the distal internal carotid diameter as the denominator. CONTRAST:  12mL OMNIPAQUE IOHEXOL 350 MG/ML SOLN COMPARISON:  CTA chest reported separately. Thyroid ultrasound earlier today. FINDINGS: Skeleton: No acute or suspicious osseous lesion from the skull base to the upper chest. Mucous retention cyst in the left maxillary sinus. Mild right mastoid effusion. Upper chest: Mediastinal mass/lymphadenopathy, reported separately today. Other neck: Bulky but indistinct soft tissue masses tracking cephalad in the right neck from the thoracic inlet. Involvement of the right level 4, right level 5, right level 3 and right level 2 nodal stations. Superimposed subcutaneous edema in the region. Estimated individual lymph nodes/soft tissue mass size up to 34 millimeters long axis, 23 millimeters short axis. The level 1 nodal station seems to be spared. The left neck appears  relatively spared. Mild leftward mass effect on the trachea and pharynx. The thyroid and pharyngeal soft tissue contours remain within normal limits. The glottis is closed. The parapharyngeal spaces are normal. And superior retropharyngeal space there is edema/effusion in the lower retropharyngeal space. The sublingual, submandibular and parotid spaces appear spared. Grossly negative visible brain parenchyma. Visualized orbit soft tissues are within normal limits. Aortic arch: 3 vessel arch configuration.  No arch atherosclerosis. Right carotid system: Mass effect on the brachiocephalic artery and to a lesser extent the right carotid space from the chest and neck soft tissue masses. No associated arterial stenosis. Negative right carotid bifurcation and cervical right ICA. Visible right ICA siphon is normal. Left carotid system: Negative aside from mild left ICA tortuosity in the neck. Negative visible left ICA siphon. Vertebral arteries: Mild mass effect on the proximal right subclavian artery due to the chest and neck soft tissue masses. No right subclavian stenosis. Normal right vertebral artery origin. Patent and normal right vertebral artery to the vertebrobasilar junction. Negative visible basilar artery. Negative proximal left subclavian artery and left vertebral artery origin. Mildly non dominant left vertebral artery is patent and negative to the vertebrobasilar junction. Other findings: Delayed venous phase images were also obtained. The right sigmoid sinus and right IJ bulb are patent at the skull base, but the right jugular vein is occluded beginning at the level of the right retromandibular vein, and continuing to the right subclavian/innominate vein confluence. The right innominate vein is occluded in the upper chest on series 19, image 113. The left innominate and left internal jugular vein remain patent. The left sigmoid and transverse sinus are patent. Review of the MIP  images confirms the above  findings IMPRESSION: 1. Bulky soft tissue masses in the right neck and continuing into the upper chest most compatible with extensive metastatic lymphadenopathy. Individual nodal masses up to 34 mm. 2. Associated thrombosis of the Right IJ, Right Innominate Vein, and likely also the SVC - See Chest CTA reported separately. 3. Mild retropharyngeal effusion likely related to #2. 4. No arterial abnormality in the neck. Electronically Signed   By: Genevie Ann M.D.   On: 01/20/2019 17:18   Mr Jeri Cos OZ Contrast  Result Date: 01/23/2019 CLINICAL DATA:  Renal cell cancer, staging. EXAM: MRI HEAD WITHOUT AND WITH CONTRAST TECHNIQUE: Multiplanar, multiecho pulse sequences of the brain and surrounding structures were obtained without and with intravenous contrast. CONTRAST:  57mL GADAVIST GADOBUTROL 1 MMOL/ML IV SOLN COMPARISON:  CT angiogram neck 01/20/2019 FINDINGS: Brain: Multiple sequences are significantly motion degraded, limiting evaluation. This includes mild motion degradation of postcontrast imaging. No abnormal intracranial enhancement is identified to suggest intracranial metastatic disease. No evidence of acute infarct. No evidence of intracranial mass. No midline shift or extra-axial fluid collection. No chronic intracranial hemorrhage. A few scattered punctate foci of T2/FLAIR hyperintensity within the cerebral white matter are nonspecific, but may reflect minimal chronic small vessel ischemic disease. Cerebral volume is normal for age. Vascular: Flow voids maintained within the proximal large arterial vessels. Skull and upper cervical spine: Normal marrow signal. Sinuses/Orbits: Imaged globes and orbits demonstrate no acute abnormality. Complete T2 hyperintense opacification of the left maxillary sinus. Mild scattered mucosal thickening within the remainder of the paranasal sinuses. Right mastoid effusion. IMPRESSION: Motion degraded examination. No evidence of intracranial metastatic disease. A few scattered  punctate signal changes within the cerebral white matter are nonspecific, but may reflect minimal chronic small vessel ischemic disease. Complete T2 hyperintense opacification of the left maxillary sinus, nonspecific but possibly reflecting fluid or a large mucous retention cyst. Right mastoid effusion. Electronically Signed   By: Kellie Simmering   On: 01/23/2019 10:59   Ct Abdomen Pelvis W Contrast  Result Date: 01/21/2019 CLINICAL DATA:  New diagnosis of mediastinal mass. Evaluate for lymphoma or metastatic disease. EXAM: CT ABDOMEN AND PELVIS WITH CONTRAST TECHNIQUE: Multidetector CT imaging of the abdomen and pelvis was performed using the standard protocol following bolus administration of intravenous contrast. CONTRAST:  119mL OMNIPAQUE IOHEXOL 300 MG/ML  SOLN COMPARISON:  None. FINDINGS: Lower chest: The unremarkable. Hepatobiliary: Hyperenhancing focus in the anterior left liver likely related to anomalous venous anatomy/flow. Liver otherwise unremarkable. Probable layering sludge in the gallbladder lumen No intrahepatic or extrahepatic biliary dilation. Pancreas: No focal mass lesion. No dilatation of the main duct. No intraparenchymal cyst. No peripancreatic edema. Spleen: No splenomegaly. No focal mass lesion. Adrenals/Urinary Tract: No adrenal nodule or mass. 2 mm nonobstructing stone noted lower pole right kidney. 15 mm subcapsular lesion in the upper pole right kidney has heterogeneous enhancement. Left kidney unremarkable. Ureters not well visualized due to lack of retroperitoneal fat but no hydroureter evident. The urinary bladder appears normal for the degree of distention. Stomach/Bowel: Stomach is unremarkable. No gastric wall thickening. No evidence of outlet obstruction. No small bowel or colonic dilatation. Vascular/Lymphatic: No abdominal aortic aneurysm. No discernible lymphadenopathy in the abdomen or pelvis. Reproductive: The prostate gland and seminal vesicles are unremarkable. Other: No  intraperitoneal free fluid. Musculoskeletal: 11 mm sclerotic focus noted posterior left iliac bone. 10 mm sclerotic focus noted in the posterior right acetabulum. Increased attenuation in lower lumbar vertebral bodies likely enhancement related to  opacified venous anatomy. IMPRESSION: 1. 15 mm heterogeneously enhancing lesion in the upper pole right kidney. Renal cell carcinoma a concern. MRI without and with contrast recommended to further evaluate. 2. No lymphadenopathy in the abdomen or pelvis. 3. Focal hyperenhancement in the anterior left liver most likely related to aberrant venous anatomy/flow. 4. Sclerotic lesions in the right acetabulum and left iliac bone are indeterminate. Likely bone islands but attention on follow-up recommended as metastatic cannot be excluded. Electronically Signed   By: Misty Stanley M.D.   On: 01/21/2019 19:41   Ct Biopsy  Result Date: 01/22/2019 CLINICAL DATA:  Mediastinal mass EXAM: CT GUIDED CORE BIOPSY OF MEDIASTINAL MASS ANESTHESIA/SEDATION: Intravenous Fentanyl 61mcg and Versed 1mg  were administered as conscious sedation during continuous monitoring of the patient?s level of consciousness and physiological / cardiorespiratory status by the radiology RN, with a total moderate sedation time of 16 minutes. PROCEDURE: The procedure risks, benefits, and alternatives were explained to the patient. Questions regarding the procedure were encouraged and answered. The patient understands and consents to the procedure. Select axial scans through the thorax were obtained. The anterior mediastinal mass was localized and an appropriate skin entry site was identified and marked. The operative field was prepped with chlorhexidinein a sterile fashion, and a sterile drape was applied covering the operative field. A sterile gown and sterile gloves were used for the procedure. Local anesthesia was provided with 1% Lidocaine. Under CT fluoroscopic guidance, a 17 gauge trocar needle was  advanced to the margin of the lesion. Once needle tip position was confirmed, coaxial 18-gauge core biopsy samples were obtained, submitted in saline to surgical pathology. The guide needle was removed. Postprocedure scan shows no hematoma, pneumothorax, or other apparent complication. COMPLICATIONS: None immediate FINDINGS: Anterior mediastinal mass was localized and representative core biopsy samples obtained as above. IMPRESSION: Technically successful CT-guided core biopsy of anterior mediastinal mass. Electronically Signed   By: Lucrezia Europe M.D.   On: 01/22/2019 16:42   Ct Angio Chest Aorta W/cm &/or Wo/cm  Addendum Date: 01/20/2019   ADDENDUM REPORT: 01/20/2019 20:45 ADDENDUM: Clarification to the report. Thrombosis of the right jugular and brachiocephalic veins and probable subclavian vein. Confluence of these vessels is surrounded/occluded by the large mass. There is suspected tumor occlusion/thrombosis of the distal left brachiocephalic vein and upper SVC. Electronically Signed   By: Donavan Foil M.D.   On: 01/20/2019 20:45   Result Date: 01/20/2019 CLINICAL DATA:  Jugular vein thrombosis EXAM: CT ANGIOGRAPHY CHEST WITH CONTRAST TECHNIQUE: Multidetector CT imaging of the chest was performed using the standard protocol during bolus administration of intravenous contrast. Multiplanar CT image reconstructions and MIPs were obtained to evaluate the vascular anatomy. CONTRAST:  152mL OMNIPAQUE IOHEXOL 350 MG/ML SOLN COMPARISON:  Thyroid ultrasound 01/20/2019 FINDINGS: Cardiovascular: Satisfactory opacification of the pulmonary arteries to the segmental level. No evidence of pulmonary embolism. Nonaneurysmal aorta. No dissection. Normal heart size. No significant pericardial effusion. Occluded right jugular and probable subclavian veins. Narrowed appearance of the left brachiocephalic vein as it approaches the venous confluence. Narrowed and likely subtotal occlusion of the upper SVC. Patency of the SVC as  it enters the right atrium. Severe narrowing of right upper lobe pulmonary arterial vessels. Numerous collateral vessels within the mediastinum. Mediastinum/Nodes: Midline trachea.  No discrete thyroid nodule. 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 Mass measures approximately 6.2 cm AP by 5.6 cm transverse by 7.9 cm craniocaudad. It is contiguous with additional mass or nodes in the right hilar  region. The mass encases/narrows the right upper lobe pulmonary vessels as well as the superior vena cava. Probable tumor invasion of the distal left brachiocephalic vessel and presumed tumor occlusion of the right brachiocephalic and jugular vessels. Partially visualized enlarged right supraclavicular nodes. Generalized edema within the soft tissues of the thoracic inlet and base of right neck. Fluid-filled esophagus with mild distal circumferential esophageal thickening. Lungs/Pleura: Emphysema. Presumed left greater than right apical scarring with left apical calcification. Posterior right upper lobe subpleural nodular focus of airspace disease measuring 13 mm, series 6, image number 55. Irregular focus of airspace disease, also within the right upper lobe abutting the fissure, series 6, image number 79. No pleural effusion or pneumothorax. Upper Abdomen: No acute abnormality. Musculoskeletal: No chest wall abnormality. No acute or significant osseous findings. Review of the MIP images confirms the above findings. IMPRESSION: 1. No acute pulmonary embolus or aortic dissection. 2. Large poorly defined malignant-appearing infiltrative mass measuring approximately 6.2 x 5.6 x 7.9 cm within the right anterior and middle mediastinum. Suspected invasion of distal left brachiocephalic vein and probable tumor occlusion of the right subclavian and jugular veins. Suspected tumor invasion of the upper SVC with string like narrowing of the SVC, but with patency of the SVC just  above the right atrium. Tumor abuts the aorta and great vessels and also results in significant narrowing of right upper lobe pulmonary arterial vessels. Incompletely visualized right supraclavicular adenopathy. Right hilar nodes and or mass. 3. At least 2 foci of somewhat nodular appearing airspace disease in the right upper lobe, either representing pneumonia or foci of neoplasm. 4. Emphysema Emphysema (ICD10-J43.9). Electronically Signed: By: Donavan Foil M.D. On: 01/20/2019 17:41   US Thyroid  Result Date: 01/20/2019 CLINICAL DATA:  Goiter, neck swelling x3 months EXAM: THYROID ULTRASOUND TECHNIQUE: Ultrasound examination of the thyroid gland and adjacent soft tissues was performed. COMPARISON:  None. FINDINGS: Parenchymal Echotexture: Mildly heterogenous Isthmus: 0.3 cm thickness Right lobe: 6 x 2.1 x 2.3 cm Left lobe: 6 x 2 x 1.8 cm _________________________________________________________ Estimated total number of nodules >/= 1 cm: 0 Number of spongiform nodules >/=  2 cm not described below (TR1): 0 Number of mixed cystic and solid nodules >/= 1.5 cm not described below (TR2): 0 _________________________________________________________ No discrete nodules are seen within the thyroid gland. Suspected echogenic thrombus occluding the right IJ vein. IMPRESSION: 1. Thyromegaly without nodule. 2. 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 The above is in keeping with the ACR TI-RADS recommendations - J Am Coll Radiol 2017;14:587-595. Electronically Signed   By: Lucrezia Europe M.D.   On: 01/20/2019 13:27      IMPRESSION/PLAN:  This visit was conducted via telephone to spare the patient unnecessary potential exposure in the healthcare setting during the current COVID-19 pandemic.  1. 53 y.o. 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  Today, I talked to the patient about the findings and workup thus far.  His  hospital case and imaging have been personally reviewed with Dr. Tammi Klippel who participated in the formulation of this treatment plan.  We discussed the natural history of metastatic carcinoma with extensive lymphadenopathy resulting in SVC and the inherent dangers associated with SVC. We discussed the need for more urgent treatment to address the SVC, even prior to having a final tissue confirmation, highlighting the role of radiotherapy in the management. We discussed the available radiation techniques, and focused on the details of logistics and  delivery. The recommendation is to proceed with a 2 week course of daily palliative radiotherapy to the right neck adenopathy delivered in 10 daily treatments. We reviewed the anticipated acute and late sequelae associated with radiation in this setting. The patient was encouraged to ask questions that were answered to his satisfaction.  At the conclusion of our conversation, the patient is eager to proceed with palliative radiotherapy as recommended.  I have spoken directly with his attending hospitalist, Dr. Wynelle Cleveland, to request patient transfer to [facility] for ease of access to begin daily radiation treatments and she agrees that he is stable for transfer.  He is tentatively scheduled for CT simulation/treatment planning at 8 AM on Friday, 01/26/2019 in anticipation of beginning his treatments Friday afternoon and likely continuing throughout the weekend.  We discussed that pending improvement in his condition and at the discretion of his hospital medicine team, once he is stable for discharge home we can continue his daily radiation treatments on an outpatient basis.  He appears to have a good understanding of his disease and our recommendations and is comfortable and in agreement with the stated plan.  In a visit lasting 70 minutes, greater than 50% of that time was spent in telephone conversation, discussing his case and coordinating his care.  Given  current concerns for patient exposure during the COVID-19 pandemic, this encounter was conducted via telephone. The patient has given verbal consent for this type of encounter. The time spent during this encounter was 70 minutes. The attendants for this meeting include Tyler Pita MD, Autymn Omlor PA-C, and patient, David Berry. During the encounter, Riaan Toledo PA-C, was located at [facility] Radiation Oncology Department.  Patient, David Berry, was located at [place].   Nicholos Johns, PA-C    Tyler Pita, MD  [place] [facility] Direct Dial: [phone]   Fax: [phone] [email or web address]   Skype   LinkedIn

## 2019-01-25 NOTE — Progress Notes (Addendum)
Initial Nutrition Assessment  RD working remotely.  DOCUMENTATION CODES:   Underweight  INTERVENTION:   -MVI with minerals daily -Ensure Enlive po TID, each supplement provides 350 kcal and 20 grams of protein  NUTRITION DIAGNOSIS:   Increased nutrient needs related to cancer and cancer related treatments as evidenced by estimated needs.  GOAL:   Patient will meet greater than or equal to 90% of their needs  MONITOR:   PO intake, Supplement acceptance, Labs, Weight trends, Skin, I & O's  REASON FOR ASSESSMENT:   Other (Comment)    ASSESSMENT:   David Berry is a 53 y.o. male with no known medical history who presented with concerns of progressive worsening right upper extremity pain with radiation to the right chest as well as new right-sided neck swelling and was found on CTA neck to have a bulky soft tissue mass in the right neck..  Patient reports that back in June he started to see an orthopedic for a right hand injury on the job.  He was initially diagnosed with carpal tunnel and received injection in August.  However, he progressively began to notice radiating pain up and down his right arm that would also go to his right chest.  He was advised to take ibuprofen but then started to notice blotches on his anterior chest after taking the medication. Denies any puritus or pain to rash. A week ago he developed new right-sided neck swelling and finally decided to come into the emergency department today after he felt more constriction around his neck.  He denies any issues with dysphasia or trouble breathing.  He had an episode of nausea, vomiting yesterday.  Also endorsed dizziness since August worse with standing.  Denies any fever, night sweats.  He reports smoking since 1997 and has about a pack of cigars every 2 days.  Denies any illicit drug use.  Reports that father had cancer but believes it was in the colon.  Pt admitted with new rt mediastinal mass.  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 Further staining to be done today. Oncology awaiting results of stain prior to initiated of chemotherapy/radiation.   Discussed pt with RN yesterday; pt has been extremely agitated and anxious regarding pathology.   Per chart review, pt with poor appetite PTA. Noted pt with good tolerance of dysphagia 3 diet. Meal completion 50-100%.   Per chart review, UBW is 135#. Noted pt has experienced a 12.6% wt loss over the past 6 months, which is significant for time frame. Highly suspect pt with malnutrition, however unable to identify at this time.   Pt would greatly benefit from addition of oral nutrition supplements.   Labs reviewed.  Diet Order:   Diet Order            DIET DYS 3 Room service appropriate? Yes; Fluid consistency: Thin  Diet effective now              EDUCATION NEEDS:   No education needs have been identified at this time  Skin:  Skin Assessment: Reviewed RN Assessment  Last  BM:  01/23/19  Height:   Ht Readings from Last 1 Encounters:  01/20/19 5\' 11"  (1.803 m)    Weight:   Wt Readings from Last 1 Encounters:  01/20/19 52 kg    Ideal Body Weight:  78.2 kg  BMI:  Body mass index is 15.99 kg/m.  Estimated Nutritional Needs:   Kcal:  1800-2000  Protein:  100-115 grams  Fluid:  > 1.8 L    Calisa Luckenbaugh A. Jimmye Norman, RD, LDN, [place] Registered Dietitian II Certified Diabetes Care and Education Specialist Pager: [phone] After hours Pager: [phone]

## 2019-01-25 NOTE — Progress Notes (Signed)
David Berry for Heparin Indication: right IJ thrombosis  Allergies  Allergen Reactions  . Pregabalin Other (See Comments)    Homicidal idealizations, pain in lower extremities     Patient Measurements: Height: 5\' 11"  (180.3 cm) Weight: 114 lb 10.2 oz (52 kg) IBW/kg (Calculated) : 75.3 Heparin Dosing Weight: 52.2 kg  Vital Signs: Temp: 98.5 F (36.9 C) (09/17 0501) Temp Source: Oral (09/17 0501) BP: 99/72 (09/17 0501) Pulse Rate: 80 (09/17 0501)  Labs: Recent Labs    01/23/19 0552  01/23/19 2055 01/24/19 0151 01/25/19 0122  HGB 11.3*  --   --  10.9* 10.8*  HCT 34.7*  --   --  33.1* 34.1*  PLT 523*  --   --  482* 474*  HEPARINUNFRC 0.13*   < > 0.37 0.31 0.32   < > = values in this interval not displayed.    Estimated Creatinine Clearance: 79.4 mL/min (by C-G formula based on SCr of 0.71 mg/dL).  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   Now s/p mass biopsy. Hgb 10.8, PLT stable   Heparin level therapeutic at lower end of range at 0.32. No bleeding or issues with drip noted    Goal of Therapy:  Heparin level 0.3-0.7 units/ml Monitor platelets by anticoagulation protocol: Yes   Plan: Continue heparin at 1500 units / hr  Daily HL / CBC Monitor for signs / symptoms of bleeding  David Berry, PharmD, BCPS, FNKF Clinical Pharmacist [name] Please utilize Amion for appropriate phone number to reach the unit pharmacist ([place])

## 2019-01-25 NOTE — Progress Notes (Signed)
  Speech Language Pathology Treatment: Dysphagia  Patient Details Name: David Berry MRN: [id number] DOB: 1965/09/17 Today's Date: 01/25/2019 Time: 0945-1000 SLP Time Calculation (min) (ACUTE ONLY): 15 min  Assessment / Plan / Recommendation Clinical Impression  Patient reports his swallowing is the same as on assessment. No noticeable increase swelling of increased difficulty with solids. Tightness in pharyngeal area with swallow. He reports growing inpatient with getting results from biopsy. He is "ready to get the whole process started". Recommend continuing current diet of Dys 3, thin liquids, medication crushed with liquids. If radiation/surgery are recommended treatment, recommend follow up with ST.    HPI HPI: Pt is a 53 year old gentleman with no prior medical history who presented to ED for right upper extremity pain and right sided neck mass and swelling. CT angiogram of the neck with and without contrast showedbulky soft tissue masses in the right neck and continuing into the upper chest most compatible with extensive metastatic      SLP Plan  Continue with current plan of care       Recommendations  Diet recommendations: Dysphagia 3 (mechanical soft);Thin liquid Liquids provided via: Cup;Straw Medication Administration: (pt prefers crushed with liquids) Supervision: Patient able to self feed Compensations: Follow solids with liquid;Effortful swallow Postural Changes and/or Swallow Maneuvers: Seated upright 90 degrees                Plan: Continue with current plan of care       [place], MA, CCC-SLP 01/25/2019 10:11 AM

## 2019-01-26 ENCOUNTER — Institutional Professional Consult (permissible substitution): Payer: Self-pay | Admitting: Radiation Oncology

## 2019-01-26 ENCOUNTER — Ambulatory Visit
Admit: 2019-01-26 | Discharge: 2019-01-26 | Disposition: A | Discharge status: Home / Self Care | Payer: Medicaid Other | Attending: Radiation Oncology | Admitting: Radiation Oncology

## 2019-01-26 DIAGNOSIS — N2889 Other specified disorders of kidney and ureter: Secondary | ICD-10-CM

## 2019-01-26 DIAGNOSIS — R221 Localized swelling, mass and lump, neck: Secondary | ICD-10-CM

## 2019-01-26 DIAGNOSIS — E010 Iodine-deficiency related diffuse (endemic) goiter: Secondary | ICD-10-CM

## 2019-01-26 DIAGNOSIS — C3490 Malignant neoplasm of unspecified part of unspecified bronchus or lung: Secondary | ICD-10-CM

## 2019-01-26 DIAGNOSIS — C3491 Malignant neoplasm of unspecified part of right bronchus or lung: Secondary | ICD-10-CM

## 2019-01-26 LAB — CBC
HCT: 37.4 % — ABNORMAL LOW (ref 39.0–52.0)
Hemoglobin: 11.7 g/dL — ABNORMAL LOW (ref 13.0–17.0)
MCH: 27 pg (ref 26.0–34.0)
MCHC: 31.3 g/dL (ref 30.0–36.0)
MCV: 86.2 fL (ref 80.0–100.0)
Platelets: 520 10*3/uL — ABNORMAL HIGH (ref 150–400)
RBC: 4.34 MIL/uL (ref 4.22–5.81)
RDW: 14.7 % (ref 11.5–15.5)
WBC: 11.3 10*3/uL — ABNORMAL HIGH (ref 4.0–10.5)
nRBC: 0 % (ref 0.0–0.2)

## 2019-01-26 LAB — HEPARIN LEVEL (UNFRACTIONATED)
Heparin Unfractionated: 0.28 IU/mL — ABNORMAL LOW (ref 0.30–0.70)
Heparin Unfractionated: 0.44 IU/mL (ref 0.30–0.70)

## 2019-01-26 NOTE — Progress Notes (Signed)
[place] for Heparin Indication: right IJ thrombosis  Allergies  Allergen Reactions  . Pregabalin Other (See Comments)    Homicidal idealizations, pain in lower extremities     Patient Measurements: Height: 5\' 11"  (180.3 cm) Weight: 114 lb 10.2 oz (52 kg) IBW/kg (Calculated) : 75.3 Heparin Dosing Weight: 52.2 kg  Vital Signs: Temp: 98.4 F (36.9 C) (09/18 0625) Temp Source: Oral (09/18 0625) BP: 96/65 (09/18 0625) Pulse Rate: 94 (09/18 0625)  Labs: Recent Labs    01/24/19 0151 01/25/19 0122 01/26/19 1004  HGB 10.9* 10.8* 11.7*  HCT 33.1* 34.1* 37.4*  PLT 482* 474* 520*  HEPARINUNFRC 0.31 0.32 0.28*    Estimated Creatinine Clearance: 79.4 mL/min (by C-G formula based on SCr of 0.71 mg/dL).  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   Now s/p mass biopsy. tx to [facility] for radiation.  01/26/2019 HL subtherapeutic at 0.28 on 1500 units/hr Hgb 11.7, plts 520 No bleeding or issues with drip per RN   Goal of Therapy:  Heparin level 0.3-0.7 units/ml Monitor platelets by anticoagulation protocol: Yes   Plan: Increase heparin to 1600 units / hr  HL in 6 hours Daily HL / CBC Monitor for signs / symptoms of bleeding  Dolly Rias RPh 01/26/2019, 10:58 AM Pager [phone]

## 2019-01-26 NOTE — Progress Notes (Signed)
Brief Pharmacy Note re: IV Heparin  53 yo male with Right IJ/innominate vein/SVC Thrombosis for heparin.   O:  Heparin level: 0.44 on 1600 units/hr       No bleeding or infusion related issues per nursing  A/P: Heparin level therapeutic.  Continue current rate.  F/U heparin level with AM labs.   Netta Cedars, PharmD, BCPS 01/26/2019@6 :11 PM

## 2019-01-26 NOTE — Progress Notes (Signed)
  Speech Language Pathology Treatment: Dysphagia  Patient Details Name: David Berry MRN: [id number] DOB: 02/13/1966 Today's Date: 01/26/2019 Time: 1340-1350 SLP Time Calculation (min) (ACUTE ONLY): 10 min  Assessment / Plan / Recommendation Clinical Impression  Pt now at [address] [facility], waiting for radiation treatment this afternoon. Pt reports no difficulty swallowing. No overt s/s aspiration observed with thin liquids. SLP will follow up after the weekend.     HPI HPI: Very pleasant 53 year old male admitted 01/20/2019 with progressive right arm, chest pain with new right sided neck swelling. Pt to start radiation treatment 01/26/2019      SLP Plan  Continue with current plan of care       Recommendations  Diet recommendations: Dysphagia 3 (mechanical soft);Thin liquid Liquids provided via: Straw;Cup Medication Administration: (per pt preference - crushed with liquids) Supervision: Patient able to self feed Compensations: Follow solids with liquid;Effortful swallow Postural Changes and/or Swallow Maneuvers: Seated upright 90 degrees                Oral Care Recommendations: Oral care BID Follow up Recommendations: Outpatient SLP SLP Visit Diagnosis: Dysphagia, unspecified (R13.10) Plan: Continue with current plan of care       [place] B. Quentin Ore [facility], CCC-SLP Speech Language Pathologist [phone]  Shonna Chock 01/26/2019, 1:52 PM

## 2019-01-26 NOTE — Progress Notes (Signed)
PROGRESS NOTE    David Berry  NID:[id number] DOB: 1965-08-28 DOA: 01/20/2019 PCP: Patient, No Pcp Per   Brief Narrative: David Berry is a 53 y.o. 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 Patient presented secondary to neck swelling and pain and found to have multiple masses initially concerning for cancer. Associated IJ vein thrombosis, started on heparin.   Assessment & Plan:   Principal Problem:   Mass of right side of neck Active Problems:   Extravasation injury of IV catheter site with other complication (HCC)   Skin rash   Internal jugular (IJ) vein thromboembolism, acute, right (HCC)   Mediastinal mass   Right renal mass   Hip mass, right   Thyromegaly   Metastatic lung cancer Associated right neck, mediastinal, RUL, right renal and right hip masses. Medical and radiation oncology on board with plans for radiation treatment. Pain controlled.  Acute right IJ vein thrombosis Patient started on IV heparin. Plan to transition to Lovenox vs Eliquis. Will discuss with hematology/oncology -Continue heparin drip  Dysphagia Secondary to above. Seen by speech therapy. Dysphagia 3 diet ordered  Tobacco use -Continue nicotine patch  Thyromegaly Normal TSH. Outpatient follow-up   DVT prophylaxis: Heparin drip Code Status:   Code Status: Full Code Family Communication: None at bedside Disposition Plan: Discharge pending radiation treatment   Consultants:   CT surgery  ENT  Pulm  IR  Procedures:   2 D ECHO 1. The left ventricle has low normal systolic function, with an ejection fraction of 50-55%. The cavity size was normal. Left ventricular diastolic Doppler parameters are consistent with impaired relaxation. 2. The right ventricle has normal systolic function. The cavity was normal. There is no increase in right ventricular wall thickness. 3. The mitral valve is degenerative.  Moderate thickening of the mitral valve leaflet. 4. The aortic valve is tricuspid. 5. The aorta is normal unless otherwise noted. 6. No intracardiac thrombi or masses were visualized.  Antimicrobials:  Zosyn (9/13>>9/15)    Subjective: No pain. Eager to get radiation started and hopefully discharge early next week  Objective: Vitals:   01/25/19 1633 01/25/19 2001 01/26/19 0625 01/26/19 1248  BP: 125/75 104/78 96/65 93/69   Pulse: 96 89 94 96  Resp: 17 19 20 15   Temp: 98.4 F (36.9 C) 98.6 F (37 C) 98.4 F (36.9 C) 99 F (37.2 C)  TempSrc: Oral Oral Oral Oral  SpO2: 96% 95% 92% 95%  Weight:      Height:        Intake/Output Summary (Last 24 hours) at 01/26/2019 1548 Last data filed at 01/26/2019 1533 Gross per 24 hour  Intake 1577.52 ml  Output 1 ml  Net 1576.52 ml   Filed Weights   01/20/19 2000 01/20/19 2100  Weight: 52.2 kg 52 kg    Examination:  General exam: Appears calm and comfortable Respiratory system: Clear to auscultation. Respiratory effort normal. Cardiovascular system: S1 & S2 heard, RRR. No murmurs, rubs, gallops or clicks. Gastrointestinal system: Abdomen is nondistended, soft and nontender. No organomegaly or masses felt. Normal bowel sounds heard. Central nervous system: Alert and oriented. No focal neurological deficits. Extremities: No edema. No calf tenderness Skin: No cyanosis. No rashes Psychiatry: Judgement and insight appear normal. Mood & affect appropriate.     Data Reviewed: I have personally reviewed following labs and imaging studies  CBC: Recent Labs  Lab 01/20/19 1157  01/22/19 0800 01/23/19 0552 01/24/19 0151 01/25/19  0122 01/26/19 1004  WBC 9.0   < > 6.7 5.3 6.1 7.0 11.3*  NEUTROABS 6.0  --   --   --   --   --   --   HGB 12.0*   < > 11.9* 11.3* 10.9* 10.8* 11.7*  HCT 37.4*   < > 37.1* 34.7* 33.1* 34.1* 37.4*  MCV 85.0   < > 85.1 83.6 83.4 84.0 86.2  PLT 382   < > 530* 523* 482* 474* 520*   < > = values in this  interval not displayed.   Basic Metabolic Panel: Recent Labs  Lab 01/20/19 1157  NA 137  K 4.4  CL 103  CO2 21*  GLUCOSE 87  BUN 8  CREATININE 0.71  CALCIUM 10.0   GFR: Estimated Creatinine Clearance: 79.4 mL/min (by C-G formula based on SCr of 0.71 mg/dL). Liver Function Tests: No results for input(s): AST, ALT, ALKPHOS, BILITOT, PROT, ALBUMIN in the last 168 hours. No results for input(s): LIPASE, AMYLASE in the last 168 hours. No results for input(s): AMMONIA in the last 168 hours. Coagulation Profile: Recent Labs  Lab 01/20/19 1357  INR 1.2   Cardiac Enzymes: No results for input(s): CKTOTAL, CKMB, CKMBINDEX, TROPONINI in the last 168 hours. BNP (last 3 results) No results for input(s): PROBNP in the last 8760 hours. HbA1C: No results for input(s): HGBA1C in the last 72 hours. CBG: No results for input(s): GLUCAP in the last 168 hours. Lipid Profile: No results for input(s): CHOL, HDL, LDLCALC, TRIG, CHOLHDL, LDLDIRECT in the last 72 hours. Thyroid Function Tests: No results for input(s): TSH, T4TOTAL, FREET4, T3FREE, THYROIDAB in the last 72 hours. Anemia Panel: No results for input(s): VITAMINB12, FOLATE, FERRITIN, TIBC, IRON, RETICCTPCT in the last 72 hours. Sepsis Labs: No results for input(s): PROCALCITON, LATICACIDVEN in the last 168 hours.  Recent Results (from the past 240 hour(s))  SARS Coronavirus 2 [facility] order, Performed in [facility] hospital lab) Nasopharyngeal Nasopharyngeal Swab     Status: None   Collection Time: 01/20/19  6:30 PM   Specimen: Nasopharyngeal Swab  Result Value Ref Range Status   SARS Coronavirus 2 NEGATIVE NEGATIVE Final    Comment: (NOTE) If result is NEGATIVE SARS-CoV-2 target nucleic acids are NOT DETECTED. The SARS-CoV-2 RNA is generally detectable in upper and lower  respiratory specimens during the acute phase of infection. The lowest  concentration of SARS-CoV-2 viral copies this assay can detect is 250  copies /  mL. A negative result does not preclude SARS-CoV-2 infection  and should not be used as the sole basis for treatment or other  patient management decisions.  A negative result may occur with  improper specimen collection / handling, submission of specimen other  than nasopharyngeal swab, presence of viral mutation(s) within the  areas targeted by this assay, and inadequate number of viral copies  (<250 copies / mL). A negative result must be combined with clinical  observations, patient history, and epidemiological information. If result is POSITIVE SARS-CoV-2 target nucleic acids are DETECTED. The SARS-CoV-2 RNA is generally detectable in upper and lower  respiratory specimens dur ing the acute phase of infection.  Positive  results are indicative of active infection with SARS-CoV-2.  Clinical  correlation with patient history and other diagnostic information is  necessary to determine patient infection status.  Positive results do  not rule out bacterial infection or co-infection with other viruses. If result is PRESUMPTIVE POSTIVE SARS-CoV-2 nucleic acids MAY BE PRESENT.   A presumptive  positive result was obtained on the submitted specimen  and confirmed on repeat testing.  While 2019 novel coronavirus  (SARS-CoV-2) nucleic acids may be present in the submitted sample  additional confirmatory testing may be necessary for epidemiological  and / or clinical management purposes  to differentiate between  SARS-CoV-2 and other Sarbecovirus currently known to infect humans.  If clinically indicated additional testing with an alternate test  methodology [phone]) is advised. The SARS-CoV-2 RNA is generally  detectable in upper and lower respiratory sp ecimens during the acute  phase of infection. The expected result is Negative. Fact Sheet for Patients:  StrictlyIdeas.no Fact Sheet for Healthcare Providers: BankingDealers.co.za This test is  not yet approved or cleared by the [place] FDA and has been authorized for detection and/or diagnosis of SARS-CoV-2 by FDA under an Emergency Use Authorization (EUA).  This EUA will remain in effect (meaning this test can be used) for the duration of the COVID-19 declaration under Section 564(b)(1) of the Act, 21 U.S.C. section 360bbb-3(b)(1), unless the authorization is terminated or revoked sooner. Performed at [facility], [place] [address], [place], [place] [address]   Surgical PCR screen     Status: Abnormal   Collection Time: 01/22/19  6:01 AM   Specimen: Nasal Mucosa; Nasal Swab  Result Value Ref Range Status   MRSA, PCR NEGATIVE NEGATIVE Final   Staphylococcus aureus POSITIVE (A) NEGATIVE Final    Comment: (NOTE) The Xpert SA Assay (FDA approved for NASAL specimens in patients 6 years of age and older), is one component of a comprehensive surveillance program. It is not intended to diagnose infection nor to guide or monitor treatment. Performed at [facility], [place] [address] [place],  [address]          Radiology Studies: No results found.      Scheduled Meds: . Chlorhexidine Gluconate Cloth  6 each Topical Daily  . feeding supplement (ENSURE ENLIVE)  237 mL Oral TID BM  . multivitamin with minerals  1 tablet Oral Daily  . mupirocin ointment  1 application Nasal BID   Continuous Infusions: . sodium chloride Stopped (01/23/19 1610)  . heparin 1,600 Units/hr (01/26/19 1401)     LOS: 6 days     Cordelia Poche, MD [facility] 01/26/2019, 3:48 PM  If 7PM-7AM, please contact night-coverage [email or web address]

## 2019-01-27 ENCOUNTER — Ambulatory Visit
Admit: 2019-01-27 | Discharge: 2019-01-27 | Disposition: A | Discharge status: Home / Self Care | Payer: Medicaid Other | Attending: Radiation Oncology | Admitting: Radiation Oncology

## 2019-01-27 DIAGNOSIS — C3491 Malignant neoplasm of unspecified part of right bronchus or lung: Secondary | ICD-10-CM

## 2019-01-27 LAB — BASIC METABOLIC PANEL
Anion gap: 9 (ref 5–15)
BUN: 9 mg/dL (ref 6–20)
CO2: 23 mmol/L (ref 22–32)
Calcium: 9.6 mg/dL (ref 8.9–10.3)
Chloride: 103 mmol/L (ref 98–111)
Creatinine, Ser: 0.67 mg/dL (ref 0.61–1.24)
GFR calc Af Amer: >60 mL/min (ref >60)
GFR calc non Af Amer: >60 mL/min (ref >60)
Glucose, Bld: 106 mg/dL — ABNORMAL HIGH (ref 70–99)
Potassium: 3.8 mmol/L (ref 3.5–5.1)
Sodium: 135 mmol/L (ref 135–145)

## 2019-01-27 LAB — HEPARIN LEVEL (UNFRACTIONATED): Heparin Unfractionated: 0.39 IU/mL (ref 0.30–0.70)

## 2019-01-27 NOTE — Progress Notes (Signed)
Brief Pharmacy Consult Note - IV heparin  Labs: heparin level 0.39  A/P: heparin level therapeutic x 2 (goal 0.3-0.7) on current IV heparin rate of 1600 units/hr. NO reported bleeding. Continue current heparin rate. Recheck heparin level in AM  Adrian Saran, PharmD, BCPS 01/27/2019 6:56 PM

## 2019-01-27 NOTE — Progress Notes (Signed)
PROGRESS NOTE    David Berry  OHY:[id number] DOB: 1965-06-29 DOA: 01/20/2019 PCP: Patient, No Pcp Per   Brief Narrative: David Berry is a 53 y.o. 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 Patient presented secondary to neck swelling and pain and found to have multiple masses initially concerning for cancer. Associated IJ vein thrombosis, started on heparin.   Assessment & Plan:   Principal Problem:   Mass of right side of neck Active Problems:   Extravasation injury of IV catheter site with other complication (HCC)   Skin rash   Internal jugular (IJ) vein thromboembolism, acute, right (HCC)   Mediastinal mass   Right renal mass   Hip mass, right   Thyromegaly   Adenocarcinoma of lung (HCC)   Metastatic lung cancer Associated right neck, mediastinal, RUL, right renal and right hip masses. Medical and radiation oncology on board with plans for radiation treatment. Pain controlled.  Acute right IJ vein thrombosis Patient started on IV heparin. Plan to transition to Lovenox vs Eliquis. Will discuss with hematology/oncology -Continue heparin drip  Dysphagia Secondary to above. Seen by speech therapy. Dysphagia 3 diet ordered   Tobacco use -Continue nicotine patch  Thyromegaly Normal TSH. Outpatient follow-up   DVT prophylaxis: Heparin drip Code Status:   Code Status: Full Code Family Communication: None at bedside Disposition Plan: Discharge pending radiation treatment   Consultants:   CT surgery  ENT  Pulm  IR  Procedures:   2 D ECHO 1. The left ventricle has low normal systolic function, with an ejection fraction of 50-55%. The cavity size was normal. Left ventricular diastolic Doppler parameters are consistent with impaired relaxation. 2. The right ventricle has normal systolic function. The cavity was normal. There is no increase in right ventricular wall thickness. 3. The  mitral valve is degenerative. Moderate thickening of the mitral valve leaflet. 4. The aortic valve is tricuspid. 5. The aorta is normal unless otherwise noted. 6. No intracardiac thrombi or masses were visualized.  Antimicrobials:  Zosyn (9/13>>9/15)    Subjective: Feeling fatigued after radiation therapy. No other concerns.  Objective: Vitals:   01/26/19 1248 01/26/19 2144 01/27/19 0538 01/27/19 1419  BP: 93/69 98/72 (!) 87/62 108/69  Pulse: 96 96 85 96  Resp: 15 20 20 12   Temp: 99 F (37.2 C) 99.1 F (37.3 C) 98.7 F (37.1 C) 97.7 F (36.5 C)  TempSrc: Oral Oral Oral Oral  SpO2: 95% 95% 97% 97%  Weight:      Height:        Intake/Output Summary (Last 24 hours) at 01/27/2019 1441 Last data filed at 01/27/2019 0700 Gross per 24 hour  Intake 679.53 ml  Output 1 ml  Net 678.53 ml   Filed Weights   01/20/19 2000 01/20/19 2100  Weight: 52.2 kg 52 kg    Examination:  General exam: Appears calm and comfortable Respiratory system: Clear to auscultation. Respiratory effort normal. Cardiovascular system: S1 & S2 heard, RRR. No murmurs, rubs, gallops or clicks. Gastrointestinal system: Abdomen is nondistended, soft and nontender. No organomegaly or masses felt. Normal bowel sounds heard. Central nervous system: Alert and oriented. No focal neurological deficits. Extremities: No UE edema. No calf tenderness Skin: No cyanosis. No rashes Psychiatry: Judgement and insight appear normal. Mood & affect appropriate.     Data Reviewed: I have personally reviewed following labs and imaging studies  CBC: Recent Labs  Lab 01/22/19 0800 01/23/19 0552 01/24/19 0151  01/25/19 0122 01/26/19 1004  WBC 6.7 5.3 6.1 7.0 11.3*  HGB 11.9* 11.3* 10.9* 10.8* 11.7*  HCT 37.1* 34.7* 33.1* 34.1* 37.4*  MCV 85.1 83.6 83.4 84.0 86.2  PLT 530* 523* 482* 474* 270*   Basic Metabolic Panel: Recent Labs  Lab 01/27/19 0425  NA 135  K 3.8  CL 103  CO2 23  GLUCOSE 106*  BUN 9   CREATININE 0.67  CALCIUM 9.6   GFR: Estimated Creatinine Clearance: 79.4 mL/min (by C-G formula based on SCr of 0.67 mg/dL). Liver Function Tests: No results for input(s): AST, ALT, ALKPHOS, BILITOT, PROT, ALBUMIN in the last 168 hours. No results for input(s): LIPASE, AMYLASE in the last 168 hours. No results for input(s): AMMONIA in the last 168 hours. Coagulation Profile: No results for input(s): INR, PROTIME in the last 168 hours. Cardiac Enzymes: No results for input(s): CKTOTAL, CKMB, CKMBINDEX, TROPONINI in the last 168 hours. BNP (last 3 results) No results for input(s): PROBNP in the last 8760 hours. HbA1C: No results for input(s): HGBA1C in the last 72 hours. CBG: No results for input(s): GLUCAP in the last 168 hours. Lipid Profile: No results for input(s): CHOL, HDL, LDLCALC, TRIG, CHOLHDL, LDLDIRECT in the last 72 hours. Thyroid Function Tests: No results for input(s): TSH, T4TOTAL, FREET4, T3FREE, THYROIDAB in the last 72 hours. Anemia Panel: No results for input(s): VITAMINB12, FOLATE, FERRITIN, TIBC, IRON, RETICCTPCT in the last 72 hours. Sepsis Labs: No results for input(s): PROCALCITON, LATICACIDVEN in the last 168 hours.  Recent Results (from the past 240 hour(s))  SARS Coronavirus 2 [facility] order, Performed in [facility] hospital lab) Nasopharyngeal Nasopharyngeal Swab     Status: None   Collection Time: 01/20/19  6:30 PM   Specimen: Nasopharyngeal Swab  Result Value Ref Range Status   SARS Coronavirus 2 NEGATIVE NEGATIVE Final    Comment: (NOTE) If result is NEGATIVE SARS-CoV-2 target nucleic acids are NOT DETECTED. The SARS-CoV-2 RNA is generally detectable in upper and lower  respiratory specimens during the acute phase of infection. The lowest  concentration of SARS-CoV-2 viral copies this assay can detect is 250  copies / mL. A negative result does not preclude SARS-CoV-2 infection  and should not be used as the sole basis for treatment or other   patient management decisions.  A negative result may occur with  improper specimen collection / handling, submission of specimen other  than nasopharyngeal swab, presence of viral mutation(s) within the  areas targeted by this assay, and inadequate number of viral copies  (<250 copies / mL). A negative result must be combined with clinical  observations, patient history, and epidemiological information. If result is POSITIVE SARS-CoV-2 target nucleic acids are DETECTED. The SARS-CoV-2 RNA is generally detectable in upper and lower  respiratory specimens dur ing the acute phase of infection.  Positive  results are indicative of active infection with SARS-CoV-2.  Clinical  correlation with patient history and other diagnostic information is  necessary to determine patient infection status.  Positive results do  not rule out bacterial infection or co-infection with other viruses. If result is PRESUMPTIVE POSTIVE SARS-CoV-2 nucleic acids MAY BE PRESENT.   A presumptive positive result was obtained on the submitted specimen  and confirmed on repeat testing.  While 2019 novel coronavirus  (SARS-CoV-2) nucleic acids may be present in the submitted sample  additional confirmatory testing may be necessary for epidemiological  and / or clinical management purposes  to differentiate between  SARS-CoV-2 and other Sarbecovirus currently  known to infect humans.  If clinically indicated additional testing with an alternate test  methodology [phone]) is advised. The SARS-CoV-2 RNA is generally  detectable in upper and lower respiratory sp ecimens during the acute  phase of infection. The expected result is Negative. Fact Sheet for Patients:  StrictlyIdeas.no Fact Sheet for Healthcare Providers: BankingDealers.co.za This test is not yet approved or cleared by the [place] FDA and has been authorized for detection and/or diagnosis of SARS-CoV-2 by  FDA under an Emergency Use Authorization (EUA).  This EUA will remain in effect (meaning this test can be used) for the duration of the COVID-19 declaration under Section 564(b)(1) of the Act, 21 U.S.C. section 360bbb-3(b)(1), unless the authorization is terminated or revoked sooner. Performed at [facility], [place] [address] [place], [place] [address]   Surgical PCR screen     Status: Abnormal   Collection Time: 01/22/19  6:01 AM   Specimen: Nasal Mucosa; Nasal Swab  Result Value Ref Range Status   MRSA, PCR NEGATIVE NEGATIVE Final   Staphylococcus aureus POSITIVE (A) NEGATIVE Final    Comment: (NOTE) The Xpert SA Assay (FDA approved for NASAL specimens in patients 22 years of age and older), is one component of a comprehensive surveillance program. It is not intended to diagnose infection nor to guide or monitor treatment. Performed at [facility], [place] [address] [place], [place] [address]          Radiology Studies: No results found.      Scheduled Meds: . Chlorhexidine Gluconate Cloth  6 each Topical Daily  . feeding supplement (ENSURE ENLIVE)  237 mL Oral TID BM  . multivitamin with minerals  1 tablet Oral Daily  . mupirocin ointment  1 application Nasal BID   Continuous Infusions: . sodium chloride 10 mL/hr at 01/26/19 2332  . heparin 1,600 Units/hr (01/27/19 0938)     LOS: 7 days     Cordelia Poche, MD [facility] 01/27/2019, 2:41 PM  If 7PM-7AM, please contact night-coverage [email or web address]

## 2019-01-28 ENCOUNTER — Ambulatory Visit
Admit: 2019-01-28 | Discharge: 2019-01-28 | Disposition: A | Discharge status: Home / Self Care | Payer: Medicaid Other | Attending: Radiation Oncology | Admitting: Radiation Oncology

## 2019-01-28 DIAGNOSIS — I8290 Acute embolism and thrombosis of unspecified vein: Secondary | ICD-10-CM

## 2019-01-28 LAB — HEPARIN LEVEL (UNFRACTIONATED): Heparin Unfractionated: 0.27 IU/mL — ABNORMAL LOW (ref 0.30–0.70)

## 2019-01-28 MED ORDER — ENOXAPARIN SODIUM 60 MG/0.6ML ~~LOC~~ SOLN
50.0000 mg | Freq: Once | SUBCUTANEOUS | Status: AC
  Administered 2019-01-28: 50 mg via SUBCUTANEOUS
  Filled 2019-01-28: qty 0.6

## 2019-01-28 MED ORDER — ENOXAPARIN (LOVENOX) PATIENT EDUCATION KIT
PACK | Freq: Once | Status: AC
  Administered 2019-01-28: 17:00:00
  Filled 2019-01-28: qty 1

## 2019-01-28 MED ORDER — ENOXAPARIN SODIUM 60 MG/0.6ML ~~LOC~~ SOLN
50.0000 mg | Freq: Two times a day (BID) | SUBCUTANEOUS | Status: DC
  Administered 2019-01-28 – 2019-01-29 (×3): 50 mg via SUBCUTANEOUS
  Filled 2019-01-28 (×3): qty 0.6

## 2019-01-28 NOTE — Progress Notes (Signed)
ANTICOAGULATION CONSULT NOTE - Follow Up Consult  Pharmacy Consult for heparin --> LMWH Indication: Right internal jugular vein thromboembolism  Allergies  Allergen Reactions  . Pregabalin Other (See Comments)    Homicidal idealizations, pain in lower extremities      Patient Measurements: Height: '5\' 11"'$  (180.3 cm) Weight: 114 lb 10.2 oz (52 kg) IBW/kg (Calculated) : 75.3 Heparin Dosing Weight:   Vital Signs:    Labs: Recent Labs    01/26/19 1004 01/26/19 1703 01/27/19 0425 01/28/19 0518  HGB 11.7*  --   --   --   HCT 37.4*  --   --   --   PLT 520*  --   --   --   HEPARINUNFRC 0.28* 0.44 0.39 0.27*  CREATININE  --   --  0.67  --     Estimated Creatinine Clearance: 79.4 mL/min (by C-G formula based on SCr of 0.67 mg/dL).  Assessment: Patient is a 53 y.o M with metastatic lung cancer with heparin drip started on for acute Right internal jugular vein thromboembolism.  Dr. Irene Limbo recommends to transition to [place] and may change to Eliquis or xarelto in 4-8 weeks since he's not keen with using needles.  If he refuses, then may transition to Eliquis.  Plan is to switch patient to [place] on 9/20.   Goal of Therapy:  Heparin level 0.3-0.7 units/ml Anti-Xa level 0.6-1 units/ml 4hrs after LMWH dose given Monitor platelets by anticoagulation protocol: Yes   Plan:  - d/c heparin drip at 11a - start Lovenox 50 mg SQ one hour after the heparin drip has been discontinued - lovenox teaching kit. Pt's RN Jenny Reichmann) will instruct patient on how to administer injection  Lenny Fiumara P 01/28/2019,10:44 AM

## 2019-01-28 NOTE — Progress Notes (Signed)
[name]   HEMATOLOGY/ONCOLOGY INPATIENT PROGRESS NOTE  Date of Service: 01/27/2019  Inpatient Attending: .Mariel Aloe, MD   SUBJECTIVE  Patient was seen in medical oncology followup. Notes some decreased in rt neck pain and swelling - likely from being on IV heparin and addressing his RT IJ DVT. Has started palliative RT from Friday and recived 2nd session today. Transitioning off IV heparin to Lovenox. He notes that he is concerned with using needle but shall try. Breathing unchanged. Some understandable emotional lability.    OBJECTIVE:  NAD  PHYSICAL EXAMINATION: . Vitals:   01/26/19 2144 01/27/19 0538 01/27/19 1419 01/27/19 2217  BP: 98/72 (!) 87/62 108/69 120/81  Pulse: 96 85 96 (!) 103  Resp: _0 Temp: 99.1 F (37.3 C) 98.7 F (37.1 C) 97.7 F (36.5 C) 98 F (36.7 C)  TempSrc: Oral Oral Oral Oral  SpO2: 95% 97% 97% 95%  Weight:      Height:       Filed Weights   01/20/19 2000 01/20/19 2100  Weight: 115 lb (52.2 kg) 114 lb 10.2 oz (52 kg)   .Body mass index is 15.99 kg/m.  GENERAL:alert, in no acute distress and comfortable EYES: normal, conjunctiva are pink and non-injected, sclera clear OROPHARYNX: MMM, poor dentition NECK: supple, some decrease in rt neck induration LYMPH:  no palpable lymphadenopathy in the cervical, axillary or inguinal LUNGS: clear to auscultation with normal respiratory effort HEART: regular rate & rhythm,  no murmurs and no lower extremity edema ABDOMEN: abdomen soft, non-tender, normoactive bowel sounds  Musculoskeletal: no cyanosis of digits and no clubbing  PSYCH: alert & oriented x 3 with fluent speech NEURO: no focal motor/sensory deficits  MEDICAL HISTORY:  History reviewed. No pertinent past medical history.  SURGICAL HISTORY: Past Surgical History:  Procedure Laterality Date   APPENDECTOMY     teenager   INGUINAL HERNIA REPAIR Right 2016, 2017   [facility], 2018 [facility]-removed mesh    SOCIAL  HISTORY: Social History   Socioeconomic History   Marital status: Single    Spouse name: Not on file   Number of children: 1   Years of education: GED   Highest education level: Not on file  Occupational History    Comment: [employer]  Social Designer, fashion/clothing strain: Not on file   Food insecurity    Worry: Not on file    Inability: Not on file   Transportation needs    Medical: Not on file    Non-medical: Not on file  Tobacco Use   Smoking status: Current Every Day Smoker    Types: Cigars   Smokeless tobacco: Never Used   Tobacco comment: smokes 3 black and mild cigars per day  Substance and Sexual Activity   Alcohol use: Yes   Drug use: No   Sexual activity: Yes  Lifestyle   Physical activity    Days per week: Not on file    Minutes per session: Not on file   Stress: Not on file  Relationships   Social connections    Talks on phone: Not on file    Gets together: Not on file    Attends religious service: Not on file    Active member of club or organization: Not on file    Attends meetings of clubs or organizations: Not on file    Relationship status: Not on file   Intimate partner violence    Fear of current or ex  partner: Not on file    Emotionally abused: Not on file    Physically abused: Not on file    Forced sexual activity: Not on file  Other Topics Concern   Not on file  Social History Narrative   Lives alone   Caffeine- coffee, 20 oz daily, tea occas    FAMILY HISTORY: Family History  Problem Relation Age of Onset   Prostate cancer Father     ALLERGIES:  is allergic to pregabalin.  MEDICATIONS:  Scheduled Meds:  Chlorhexidine Gluconate Cloth  6 each Topical Daily   feeding supplement (ENSURE ENLIVE)  237 mL Oral TID BM   multivitamin with minerals  1 tablet Oral Daily   mupirocin ointment  1 application Nasal BID   Continuous Infusions:  sodium chloride 10 mL/hr at 01/27/19 2313   heparin 1,600  Units/hr (01/27/19 2311)   PRN Meds:.acetaminophen **OR** acetaminophen, HYDROcodone-acetaminophen, morphine injection, traMADol  REVIEW OF SYSTEMS:    10 Point review of Systems was done is negative except as noted above.   LABORATORY DATA:  I have reviewed the data as listed  . CBC Latest Ref Rng & Units 01/26/2019 01/25/2019 01/24/2019  WBC 4.0 - 10.5 K/uL 11.3(H) 7.0 6.1  Hemoglobin 13.0 - 17.0 g/dL 11.7(L) 10.8(L) 10.9(L)  Hematocrit 39.0 - 52.0 % 37.4(L) 34.1(L) 33.1(L)  Platelets 150 - 400 K/uL 520(H) 474(H) 482(H)    . CMP Latest Ref Rng & Units 01/27/2019 01/20/2019  Glucose 70 - 99 mg/dL 106(H) 87  BUN 6 - 20 mg/dL 9 8  Creatinine 0.61 - 1.24 mg/dL 0.67 0.71  Sodium 135 - 145 mmol/L 135 137  Potassium 3.5 - 5.1 mmol/L 3.8 4.4  Chloride 98 - 111 mmol/L 103 103  CO2 22 - 32 mmol/L 23 21(L)  Calcium 8.9 - 10.3 mg/dL 9.6 10.0     RADIOGRAPHIC STUDIES: I have personally reviewed the radiological images as listed and agreed with the findings in the report. Ct Angio Neck W And/or Wo Contrast  Result Date: 01/20/2019 CLINICAL DATA:  53 year old male with unexplained neck swelling for 3 months. Evidence of right IJ occlusion on thyroid ultrasound earlier today. EXAM: CT ANGIOGRAPHY NECK TECHNIQUE: Multidetector CT imaging of the neck was performed using the standard protocol during bolus administration of intravenous contrast. Multiplanar CT image reconstructions and MIPs were obtained to evaluate the vascular anatomy. Carotid stenosis measurements (when applicable) are obtained utilizing NASCET criteria, using the distal internal carotid diameter as the denominator. CONTRAST:  142m OMNIPAQUE IOHEXOL 350 MG/ML SOLN COMPARISON:  CTA chest reported separately. Thyroid ultrasound earlier today. FINDINGS: Skeleton: No acute or suspicious osseous lesion from the skull base to the upper chest. Mucous retention cyst in the left maxillary sinus. Mild right mastoid effusion. Upper chest:  Mediastinal mass/lymphadenopathy, reported separately today. Other neck: Bulky but indistinct soft tissue masses tracking cephalad in the right neck from the thoracic inlet. Involvement of the right level 4, right level 5, right level 3 and right level 2 nodal stations. Superimposed subcutaneous edema in the region. Estimated individual lymph nodes/soft tissue mass size up to 34 millimeters long axis, 23 millimeters short axis. The level 1 nodal station seems to be spared. The left neck appears relatively spared. Mild leftward mass effect on the trachea and pharynx. The thyroid and pharyngeal soft tissue contours remain within normal limits. The glottis is closed. The parapharyngeal spaces are normal. And superior retropharyngeal space there is edema/effusion in the lower retropharyngeal space. The sublingual, submandibular and parotid spaces appear  spared. Grossly negative visible brain parenchyma. Visualized orbit soft tissues are within normal limits. Aortic arch: 3 vessel arch configuration.  No arch atherosclerosis. Right carotid system: Mass effect on the brachiocephalic artery and to a lesser extent the right carotid space from the chest and neck soft tissue masses. No associated arterial stenosis. Negative right carotid bifurcation and cervical right ICA. Visible right ICA siphon is normal. Left carotid system: Negative aside from mild left ICA tortuosity in the neck. Negative visible left ICA siphon. Vertebral arteries: Mild mass effect on the proximal right subclavian artery due to the chest and neck soft tissue masses. No right subclavian stenosis. Normal right vertebral artery origin. Patent and normal right vertebral artery to the vertebrobasilar junction. Negative visible basilar artery. Negative proximal left subclavian artery and left vertebral artery origin. Mildly non dominant left vertebral artery is patent and negative to the vertebrobasilar junction. Other findings: Delayed venous phase images  were also obtained. The right sigmoid sinus and right IJ bulb are patent at the skull base, but the right jugular vein is occluded beginning at the level of the right retromandibular vein, and continuing to the right subclavian/innominate vein confluence. The right innominate vein is occluded in the upper chest on series 19, image 113. The left innominate and left internal jugular vein remain patent. The left sigmoid and transverse sinus are patent. Review of the MIP images confirms the above findings IMPRESSION: 1. Bulky soft tissue masses in the right neck and continuing into the upper chest most compatible with extensive metastatic lymphadenopathy. Individual nodal masses up to 34 mm. 2. Associated thrombosis of the Right IJ, Right Innominate Vein, and likely also the SVC - See Chest CTA reported separately. 3. Mild retropharyngeal effusion likely related to #2. 4. No arterial abnormality in the neck. Electronically Signed   By: Genevie Ann M.D.   On: 01/20/2019 17:18   Mr Jeri Cos ME Contrast  Result Date: 01/23/2019 CLINICAL DATA:  Renal cell cancer, staging. EXAM: MRI HEAD WITHOUT AND WITH CONTRAST TECHNIQUE: Multiplanar, multiecho pulse sequences of the brain and surrounding structures were obtained without and with intravenous contrast. CONTRAST:  67m GADAVIST GADOBUTROL 1 MMOL/ML IV SOLN COMPARISON:  CT angiogram neck 01/20/2019 FINDINGS: Brain: Multiple sequences are significantly motion degraded, limiting evaluation. This includes mild motion degradation of postcontrast imaging. No abnormal intracranial enhancement is identified to suggest intracranial metastatic disease. No evidence of acute infarct. No evidence of intracranial mass. No midline shift or extra-axial fluid collection. No chronic intracranial hemorrhage. A few scattered punctate foci of T2/FLAIR hyperintensity within the cerebral white matter are nonspecific, but may reflect minimal chronic small vessel ischemic disease. Cerebral volume is  normal for age. Vascular: Flow voids maintained within the proximal large arterial vessels. Skull and upper cervical spine: Normal marrow signal. Sinuses/Orbits: Imaged globes and orbits demonstrate no acute abnormality. Complete T2 hyperintense opacification of the left maxillary sinus. Mild scattered mucosal thickening within the remainder of the paranasal sinuses. Right mastoid effusion. IMPRESSION: Motion degraded examination. No evidence of intracranial metastatic disease. A few scattered punctate signal changes within the cerebral white matter are nonspecific, but may reflect minimal chronic small vessel ischemic disease. Complete T2 hyperintense opacification of the left maxillary sinus, nonspecific but possibly reflecting fluid or a large mucous retention cyst. Right mastoid effusion. Electronically Signed   By: KKellie Simmering  On: 01/23/2019 10:59   Ct Abdomen Pelvis W Contrast  Result Date: 01/21/2019 CLINICAL DATA:  New diagnosis of mediastinal mass. Evaluate for  lymphoma or metastatic disease. EXAM: CT ABDOMEN AND PELVIS WITH CONTRAST TECHNIQUE: Multidetector CT imaging of the abdomen and pelvis was performed using the standard protocol following bolus administration of intravenous contrast. CONTRAST:  12m OMNIPAQUE IOHEXOL 300 MG/ML  SOLN COMPARISON:  None. FINDINGS: Lower chest: The unremarkable. Hepatobiliary: Hyperenhancing focus in the anterior left liver likely related to anomalous venous anatomy/flow. Liver otherwise unremarkable. Probable layering sludge in the gallbladder lumen No intrahepatic or extrahepatic biliary dilation. Pancreas: No focal mass lesion. No dilatation of the main duct. No intraparenchymal cyst. No peripancreatic edema. Spleen: No splenomegaly. No focal mass lesion. Adrenals/Urinary Tract: No adrenal nodule or mass. 2 mm nonobstructing stone noted lower pole right kidney. 15 mm subcapsular lesion in the upper pole right kidney has heterogeneous enhancement. Left kidney  unremarkable. Ureters not well visualized due to lack of retroperitoneal fat but no hydroureter evident. The urinary bladder appears normal for the degree of distention. Stomach/Bowel: Stomach is unremarkable. No gastric wall thickening. No evidence of outlet obstruction. No small bowel or colonic dilatation. Vascular/Lymphatic: No abdominal aortic aneurysm. No discernible lymphadenopathy in the abdomen or pelvis. Reproductive: The prostate gland and seminal vesicles are unremarkable. Other: No intraperitoneal free fluid. Musculoskeletal: 11 mm sclerotic focus noted posterior left iliac bone. 10 mm sclerotic focus noted in the posterior right acetabulum. Increased attenuation in lower lumbar vertebral bodies likely enhancement related to opacified venous anatomy. IMPRESSION: 1. 15 mm heterogeneously enhancing lesion in the upper pole right kidney. Renal cell carcinoma a concern. MRI without and with contrast recommended to further evaluate. 2. No lymphadenopathy in the abdomen or pelvis. 3. Focal hyperenhancement in the anterior left liver most likely related to aberrant venous anatomy/flow. 4. Sclerotic lesions in the right acetabulum and left iliac bone are indeterminate. Likely bone islands but attention on follow-up recommended as metastatic cannot be excluded. Electronically Signed   By: EMisty StanleyM.D.   On: 01/21/2019 19:41   Ct Biopsy  Result Date: 01/22/2019 CLINICAL DATA:  Mediastinal mass EXAM: CT GUIDED CORE BIOPSY OF MEDIASTINAL MASS ANESTHESIA/SEDATION: Intravenous Fentanyl 577m and Versed 50m5mere administered as conscious sedation during continuous monitoring of the patient?s level of consciousness and physiological / cardiorespiratory status by the radiology RN, with a total moderate sedation time of 16 minutes. PROCEDURE: The procedure risks, benefits, and alternatives were explained to the patient. Questions regarding the procedure were encouraged and answered. The patient understands and  consents to the procedure. Select axial scans through the thorax were obtained. The anterior mediastinal mass was localized and an appropriate skin entry site was identified and marked. The operative field was prepped with chlorhexidinein a sterile fashion, and a sterile drape was applied covering the operative field. A sterile gown and sterile gloves were used for the procedure. Local anesthesia was provided with 1% Lidocaine. Under CT fluoroscopic guidance, a 17 gauge trocar needle was advanced to the margin of the lesion. Once needle tip position was confirmed, coaxial 18-gauge core biopsy samples were obtained, submitted in saline to surgical pathology. The guide needle was removed. Postprocedure scan shows no hematoma, pneumothorax, or other apparent complication. COMPLICATIONS: None immediate FINDINGS: Anterior mediastinal mass was localized and representative core biopsy samples obtained as above. IMPRESSION: Technically successful CT-guided core biopsy of anterior mediastinal mass. Electronically Signed   By: D  Lucrezia EuropeD.   On: 01/22/2019 16:42   Ct Angio Chest Aorta W/cm &/or Wo/cm  Addendum Date: 01/20/2019   ADDENDUM REPORT: 01/20/2019 20:45 ADDENDUM: Clarification to the report. Thrombosis of the  right jugular and brachiocephalic veins and probable subclavian vein. Confluence of these vessels is surrounded/occluded by the large mass. There is suspected tumor occlusion/thrombosis of the distal left brachiocephalic vein and upper SVC. Electronically Signed   By: Donavan Foil M.D.   On: 01/20/2019 20:45   Result Date: 01/20/2019 CLINICAL DATA:  Jugular vein thrombosis EXAM: CT ANGIOGRAPHY CHEST WITH CONTRAST TECHNIQUE: Multidetector CT imaging of the chest was performed using the standard protocol during bolus administration of intravenous contrast. Multiplanar CT image reconstructions and MIPs were obtained to evaluate the vascular anatomy. CONTRAST:  123m OMNIPAQUE IOHEXOL 350 MG/ML SOLN  COMPARISON:  Thyroid ultrasound 01/20/2019 FINDINGS: Cardiovascular: Satisfactory opacification of the pulmonary arteries to the segmental level. No evidence of pulmonary embolism. Nonaneurysmal aorta. No dissection. Normal heart size. No significant pericardial effusion. Occluded right jugular and probable subclavian veins. Narrowed appearance of the left brachiocephalic vein as it approaches the venous confluence. Narrowed and likely subtotal occlusion of the upper SVC. Patency of the SVC as it enters the right atrium. Severe narrowing of right upper lobe pulmonary arterial vessels. Numerous collateral vessels within the mediastinum. Mediastinum/Nodes: Midline trachea.  No discrete thyroid nodule. 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 Mass measures approximately 6.2 cm AP by 5.6 cm transverse by 7.9 cm craniocaudad. It is contiguous with additional mass or nodes in the right hilar region. The mass encases/narrows the right upper lobe pulmonary vessels as well as the superior vena cava. Probable tumor invasion of the distal left brachiocephalic vessel and presumed tumor occlusion of the right brachiocephalic and jugular vessels. Partially visualized enlarged right supraclavicular nodes. Generalized edema within the soft tissues of the thoracic inlet and base of right neck. Fluid-filled esophagus with mild distal circumferential esophageal thickening. Lungs/Pleura: Emphysema. Presumed left greater than right apical scarring with left apical calcification. Posterior right upper lobe subpleural nodular focus of airspace disease measuring 13 mm, series 6, image number 55. Irregular focus of airspace disease, also within the right upper lobe abutting the fissure, series 6, image number 79. No pleural effusion or pneumothorax. Upper Abdomen: No acute abnormality. Musculoskeletal: No chest wall abnormality. No acute or significant osseous findings. Review of the  MIP images confirms the above findings. IMPRESSION: 1. No acute pulmonary embolus or aortic dissection. 2. Large poorly defined malignant-appearing infiltrative mass measuring approximately 6.2 x 5.6 x 7.9 cm within the right anterior and middle mediastinum. Suspected invasion of distal left brachiocephalic vein and probable tumor occlusion of the right subclavian and jugular veins. Suspected tumor invasion of the upper SVC with string like narrowing of the SVC, but with patency of the SVC just above the right atrium. Tumor abuts the aorta and great vessels and also results in significant narrowing of right upper lobe pulmonary arterial vessels. Incompletely visualized right supraclavicular adenopathy. Right hilar nodes and or mass. 3. At least 2 foci of somewhat nodular appearing airspace disease in the right upper lobe, either representing pneumonia or foci of neoplasm. 4. Emphysema Emphysema (ICD10-J43.9). Electronically Signed: By: KDonavan FoilM.D. On: 01/20/2019 17:41   UKoreaThyroid  Result Date: 01/20/2019 CLINICAL DATA:  Goiter, neck swelling x3 months EXAM: THYROID ULTRASOUND TECHNIQUE: Ultrasound examination of the thyroid gland and adjacent soft tissues was performed. COMPARISON:  None. FINDINGS: Parenchymal Echotexture: Mildly heterogenous Isthmus: 0.3 cm thickness Right lobe: 6 x 2.1 x 2.3 cm Left lobe: 6 x 2 x 1.8 cm _________________________________________________________ Estimated total number of nodules >/= 1 cm: 0 Number of spongiform nodules >/=  2 cm not described below (TR1): 0 Number of mixed cystic and solid nodules >/= 1.5 cm not described below (TR2): 0 _________________________________________________________ No discrete nodules are seen within the thyroid gland. Suspected echogenic thrombus occluding the right IJ vein. IMPRESSION: 1. Thyromegaly without nodule. 2. 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 The above is in keeping with the ACR TI-RADS recommendations - J Am Coll  Radiol 2017;14:587-595. Electronically Signed   By: Lucrezia Europe M.D.   On: 01/20/2019 13:27    ASSESSMENT & PLAN:   This is a 53 year old male with  1.  Newly diagnosed Metastatic Poorly differentiated lung adenocarcinoma Presented withLarge neck mass, mediastinal mass, renal mass, and questionable bone lesions no brain mets on MRI brain   2.  Impending SVC syndrome PLAN -patient has been started on palliative RT for impending SVC syndrome and abuttment/ptoential invasion of large mediastinal arteries. -will discuss with rad onc about RT plan if hypofractionated RT in about 10 sessions planned will plan systemic treatments after completing that (systemic rx  palliative platinum doublet + PD1 inhibitor vs targeted therapy based on foundation one results/PDl1 testing) -if longer scheme of RT planned would consider starting outpatient concurrent chemotherapy to control risk of systemic progression outside of RT field and improve on response of palliative RT given potentially resistant poorly differentiated tumor. -will setup patient for outpatient medical oncology f/u in 1 week with Dr Irene Limbo  3.  Right internal jugular vein thromboembolism -On IV heparin -Would recommend transition to therapeutic lovenox in the setting of cancer related critical DVT. -since patient is skittish with needles would plan to transition to Eliquis or xarelto in 4- 8 weeks. -if patient is unable/unwilling to use lovenox altogether then will have to switch to Eliquis -discussed with Dr Lonny Prude  4. Normocytic anemia -Likely due to underlying malignancy -Will check ferritin, iron studies, vitamin B12 level, and RBC folate in the a.m.  5. Thrombocytosis -- likely due to paraneoplastic effect of tumor and reactive due to inflammation and tissue inflammation from RT.  5.  Nicotine dependence -He was counseled about tobacco cessation.   I spent 25 minutes counseling the patient face to face. The total time spent  in the appointment was 35 minutes and more than 50% was on counseling and direct patient cares and co-ordination of cares.    Sullivan Lone MD [place] AAHIVMS [facility] Hematology/Oncology Physician [facility]  (Office):       [phone] (Work cell):  [phone] (Fax):           [phone]

## 2019-01-28 NOTE — Progress Notes (Addendum)
  Radiation Oncology         [phone]) [phone] ________________________________  Name: David Berry MRN: [id number]  Date: 01/26/2019  DOB: 19-Jul-1965  SIMULATION AND TREATMENT PLANNING NOTE    ICD-10-CM   1. 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  Identity was confirmed.  All relevant records and images related to the planned course of therapy were reviewed.  The patient freely provided informed written consent to proceed with treatment after reviewing the details related to the planned course of therapy. The consent form was witnessed and verified by the simulation staff.  Then, the patient was set-up in a stable reproducible  supine position for radiation therapy.  CT images were obtained.  Surface markings were placed.  The CT images were loaded into the planning software.  Then the target and avoidance structures were contoured.  Treatment planning then occurred.  The radiation prescription was entered and confirmed.  Then, I designed and supervised the construction of a total of 6 medically necessary complex treatment devices, including a BodyFix immobilization mold custom fitted to the patient along with 5 multileaf collimators conformally shaped radiation around the treatment target while shielding critical structures such as the heart and spinal cord maximally.  I have requested : 3D Simulation  I have requested a DVH of the following structures: Left lung, right lung, spinal cord, heart, esophagus, and target.  I have ordered:Nutrition Consult  SPECIAL TREATMENT PROCEDURE:  The planned course of therapy using radiation constitutes a special treatment procedure. Special care is required in the management of this patient for the following reasons.  The patient will be receiving concurrent chemotherapy requiring careful  monitoring for increased toxicities of treatment including periodic laboratory values.  The special nature of the planned course of radiotherapy will require increased physician supervision and oversight to ensure patient's safety with optimal treatment outcomes.  PLAN:  The patient will receive up to 66 Gy in 33 fractions, depending on staging work-up and prognosis.  Brain MRI negative, CT abd/pelv is indeterminate for metastases.  Given his young age, we will discuss with med-onc to proceed as if locally advanced disease.  ________________________________  Sheral Apley. Tammi Klippel, M.D.

## 2019-01-28 NOTE — Progress Notes (Signed)
PROGRESS NOTE    David Berry  PQZ:[id number] DOB: 1965/09/18 DOA: 01/20/2019 PCP: Patient, No Pcp Per   Brief Narrative: David Berry is a 53 y.o. 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 Patient presented secondary to neck swelling and pain and found to have multiple masses initially concerning for cancer. Associated IJ vein thrombosis, started on heparin.   Assessment & Plan:   Principal Problem:   Mass of right side of neck Active Problems:   Extravasation injury of IV catheter site with other complication (HCC)   Skin rash   Internal jugular (IJ) vein thromboembolism, acute, right (HCC)   Mediastinal mass   Right renal mass   Hip mass, right   Thyromegaly   Adenocarcinoma of lung (HCC)   Acute deep vein thrombosis (DVT) of non-extremity vein   Metastatic lung cancer Associated right neck, mediastinal, RUL, right renal and right hip masses. Medical and radiation oncology on board with plans for radiation treatment. Pain controlled.  Impending SVC syndrome Patient receiving palliative radiation treatment.  Acute right IJ vein thrombosis Patient started on IV heparin. Plan to transition to Lovenox; this has been discussed with patient over the last two days -Discontinue and transition to Lovenox treatment -Pharmacy to counsel  Dysphagia Secondary to above. Seen by speech therapy. Dysphagia 3 diet ordered   Tobacco use Counseled during admission -Continue nicotine patch  Thyromegaly Normal TSH. Outpatient follow-up   DVT prophylaxis: Heparin drip Code Status:   Code Status: Full Code Family Communication: None at bedside Disposition Plan: Discharge pending radiation treatment   Consultants:   CT surgery  ENT  Pulm  IR  Oncology  Procedures:   2 D ECHO 1. The left ventricle has low normal systolic function, with an ejection fraction of 50-55%. The cavity size was normal.  Left ventricular diastolic Doppler parameters are consistent with impaired relaxation. 2. The right ventricle has normal systolic function. The cavity was normal. There is no increase in right ventricular wall thickness. 3. The mitral valve is degenerative. Moderate thickening of the mitral valve leaflet. 4. The aortic valve is tricuspid. 5. The aorta is normal unless otherwise noted. 6. No intracardiac thrombi or masses were visualized.  Antimicrobials:  Zosyn (9/13>>9/15)    Subjective: Feeling a little fatigued today. Some tightness of his neck but nothing worse than usual  Objective: Vitals:   01/26/19 2144 01/27/19 0538 01/27/19 1419 01/27/19 2217  BP: 98/72 (!) 87/62 108/69 120/81  Pulse: 96 85 96 (!) 103  Resp: 20 20 12 18   Temp: 99.1 F (37.3 C) 98.7 F (37.1 C) 97.7 F (36.5 C) 98 F (36.7 C)  TempSrc: Oral Oral Oral Oral  SpO2: 95% 97% 97% 95%  Weight:      Height:        Intake/Output Summary (Last 24 hours) at 01/28/2019 1021 Last data filed at 01/27/2019 1700 Gross per 24 hour  Intake 270 ml  Output -  Net 270 ml   Filed Weights   01/20/19 2000 01/20/19 2100  Weight: 52.2 kg 52 kg    Examination:  General: Well appearing, no distress    Data Reviewed: I have personally reviewed following labs and imaging studies  CBC: Recent Labs  Lab 01/22/19 0800 01/23/19 0552 01/24/19 0151 01/25/19 0122 01/26/19 1004  WBC 6.7 5.3 6.1 7.0 11.3*  HGB 11.9* 11.3* 10.9* 10.8* 11.7*  HCT 37.1* 34.7* 33.1* 34.1* 37.4*  MCV 85.1 83.6 83.4  84.0 86.2  PLT 530* 523* 482* 474* 124*   Basic Metabolic Panel: Recent Labs  Lab 01/27/19 0425  NA 135  K 3.8  CL 103  CO2 23  GLUCOSE 106*  BUN 9  CREATININE 0.67  CALCIUM 9.6   GFR: Estimated Creatinine Clearance: 79.4 mL/min (by C-G formula based on SCr of 0.67 mg/dL). Liver Function Tests: No results for input(s): AST, ALT, ALKPHOS, BILITOT, PROT, ALBUMIN in the last 168 hours. No results for  input(s): LIPASE, AMYLASE in the last 168 hours. No results for input(s): AMMONIA in the last 168 hours. Coagulation Profile: No results for input(s): INR, PROTIME in the last 168 hours. Cardiac Enzymes: No results for input(s): CKTOTAL, CKMB, CKMBINDEX, TROPONINI in the last 168 hours. BNP (last 3 results) No results for input(s): PROBNP in the last 8760 hours. HbA1C: No results for input(s): HGBA1C in the last 72 hours. CBG: No results for input(s): GLUCAP in the last 168 hours. Lipid Profile: No results for input(s): CHOL, HDL, LDLCALC, TRIG, CHOLHDL, LDLDIRECT in the last 72 hours. Thyroid Function Tests: No results for input(s): TSH, T4TOTAL, FREET4, T3FREE, THYROIDAB in the last 72 hours. Anemia Panel: No results for input(s): VITAMINB12, FOLATE, FERRITIN, TIBC, IRON, RETICCTPCT in the last 72 hours. Sepsis Labs: No results for input(s): PROCALCITON, LATICACIDVEN in the last 168 hours.  Recent Results (from the past 240 hour(s))  SARS Coronavirus 2 [facility] order, Performed in [facility] hospital lab) Nasopharyngeal Nasopharyngeal Swab     Status: None   Collection Time: 01/20/19  6:30 PM   Specimen: Nasopharyngeal Swab  Result Value Ref Range Status   SARS Coronavirus 2 NEGATIVE NEGATIVE Final    Comment: (NOTE) If result is NEGATIVE SARS-CoV-2 target nucleic acids are NOT DETECTED. The SARS-CoV-2 RNA is generally detectable in upper and lower  respiratory specimens during the acute phase of infection. The lowest  concentration of SARS-CoV-2 viral copies this assay can detect is 250  copies / mL. A negative result does not preclude SARS-CoV-2 infection  and should not be used as the sole basis for treatment or other  patient management decisions.  A negative result may occur with  improper specimen collection / handling, submission of specimen other  than nasopharyngeal swab, presence of viral mutation(s) within the  areas targeted by this assay, and inadequate number  of viral copies  (<250 copies / mL). A negative result must be combined with clinical  observations, patient history, and epidemiological information. If result is POSITIVE SARS-CoV-2 target nucleic acids are DETECTED. The SARS-CoV-2 RNA is generally detectable in upper and lower  respiratory specimens dur ing the acute phase of infection.  Positive  results are indicative of active infection with SARS-CoV-2.  Clinical  correlation with patient history and other diagnostic information is  necessary to determine patient infection status.  Positive results do  not rule out bacterial infection or co-infection with other viruses. If result is PRESUMPTIVE POSTIVE SARS-CoV-2 nucleic acids MAY BE PRESENT.   A presumptive positive result was obtained on the submitted specimen  and confirmed on repeat testing.  While 2019 novel coronavirus  (SARS-CoV-2) nucleic acids may be present in the submitted sample  additional confirmatory testing may be necessary for epidemiological  and / or clinical management purposes  to differentiate between  SARS-CoV-2 and other Sarbecovirus currently known to infect humans.  If clinically indicated additional testing with an alternate test  methodology [phone]) is advised. The SARS-CoV-2 RNA is generally  detectable in upper and lower  respiratory sp ecimens during the acute  phase of infection. The expected result is Negative. Fact Sheet for Patients:  StrictlyIdeas.no Fact Sheet for Healthcare Providers: BankingDealers.co.za This test is not yet approved or cleared by the [place] FDA and has been authorized for detection and/or diagnosis of SARS-CoV-2 by FDA under an Emergency Use Authorization (EUA).  This EUA will remain in effect (meaning this test can be used) for the duration of the COVID-19 declaration under Section 564(b)(1) of the Act, 21 U.S.C. section 360bbb-3(b)(1), unless the authorization is  terminated or revoked sooner. Performed at [facility], [place] [address], [place], [place] [address]   Surgical PCR screen     Status: Abnormal   Collection Time: 01/22/19  6:01 AM   Specimen: Nasal Mucosa; Nasal Swab  Result Value Ref Range Status   MRSA, PCR NEGATIVE NEGATIVE Final   Staphylococcus aureus POSITIVE (A) NEGATIVE Final    Comment: (NOTE) The Xpert SA Assay (FDA approved for NASAL specimens in patients 71 years of age and older), is one component of a comprehensive surveillance program. It is not intended to diagnose infection nor to guide or monitor treatment. Performed at [facility], [place] [address] [place], [place] [address]          Radiology Studies: No results found.      Scheduled Meds: . Chlorhexidine Gluconate Cloth  6 each Topical Daily  . feeding supplement (ENSURE ENLIVE)  237 mL Oral TID BM  . multivitamin with minerals  1 tablet Oral Daily  . mupirocin ointment  1 application Nasal BID   Continuous Infusions: . sodium chloride 10 mL/hr at 01/27/19 2313  . heparin 1,600 Units/hr (01/27/19 2311)     LOS: 8 days     Cordelia Poche, MD [facility] 01/28/2019, 10:21 AM  If 7PM-7AM, please contact night-coverage [email or web address]

## 2019-01-29 ENCOUNTER — Ambulatory Visit
Admit: 2019-01-29 | Discharge: 2019-01-29 | Disposition: A | Discharge status: Home / Self Care | Payer: Medicaid Other | Attending: Radiation Oncology | Admitting: Radiation Oncology

## 2019-01-29 LAB — CBC
HCT: 33 % — ABNORMAL LOW (ref 39.0–52.0)
Hemoglobin: 10.4 g/dL — ABNORMAL LOW (ref 13.0–17.0)
MCH: 27.1 pg (ref 26.0–34.0)
MCHC: 31.5 g/dL (ref 30.0–36.0)
MCV: 85.9 fL (ref 80.0–100.0)
Platelets: 490 10*3/uL — ABNORMAL HIGH (ref 150–400)
RBC: 3.84 MIL/uL — ABNORMAL LOW (ref 4.22–5.81)
RDW: 15 % (ref 11.5–15.5)
WBC: 6.9 10*3/uL (ref 4.0–10.5)
nRBC: 0 % (ref 0.0–0.2)

## 2019-01-29 MED ORDER — HYDROCODONE-ACETAMINOPHEN 5-325 MG PO TABS: 1.0000 | ORAL_TABLET | ORAL | 0 refills | Status: DC | PRN

## 2019-01-29 MED ORDER — ENOXAPARIN SODIUM 60 MG/0.6ML ~~LOC~~ SOLN: 50.0000 mg | Freq: Two times a day (BID) | SUBCUTANEOUS | 0 refills | Status: DC

## 2019-01-29 NOTE — Discharge Summary (Signed)
Physician Discharge Summary  David Berry JKD:[id number] DOB: 25-Aug-1965 DOA: 01/20/2019  PCP: Patient, No Pcp Per  Admit date: 01/20/2019 Discharge date: 01/29/2019  Admitted From: Home Disposition: Home  Recommendations for Outpatient Follow-up:  1. Follow up with PCP in 1 week 2. Oncology and radiation oncology follow-up 3. Please obtain BMP/CBC in one week 4. Patient may need continued help with medication costs, specifically anticoagulation 5. Please follow up on the following pending results: None  Home Health: None Equipment/Devices: None  Discharge Condition: Stable CODE STATUS: Full code Diet recommendation: Regular diet   Brief/Interim Summary:  Admission HPI written by Orene Desanctis, DO   Chief Complaint: Progressive right arm, chest pain with new right sided neck swelling  HPI: David Berry is a 53 y.o. male with no known medical history who presented with concerns of progressive worsening right upper extremity pain with radiation to the right chest as well as new right-sided neck swelling and was found on CTA neck to have a bulky soft tissue mass in the right neck..  Patient reports that back in June he started to see an orthopedic for a right hand injury on the job.  He was initially diagnosed with carpal tunnel and received injection in August.  However, he progressively began to notice radiating pain up and down his right arm that would also go to his right chest.  He was advised to take ibuprofen but then started to notice blotches on his anterior chest after taking the medication. Denies any puritus or pain to rash. A week ago he developed new right-sided neck swelling and finally decided to come into the emergency department today after he felt more constriction around his neck.  He denies any issues with dysphasia or trouble breathing.  He had an episode of nausea, vomiting yesterday.  Also endorsed dizziness since August worse with standing.  Denies any  fever, night sweats.  He reports smoking since 1997 and has about a pack of cigars every 2 days.  Denies any illicit drug use.  Reports that father had cancer but believes it was in the colon.  ED Course: He was afebrile and initially hypotensive with BP of 96/61 with improvement after 104/74 without intervention.  He was hemodynamically stable on room air.  CBC showed no leukocytosis but had anemia of12 with unknown baseline.  BMP was otherwise unremarkable.  TSH within normal limits.  CTA neck revealed a bulky soft tissue mass in the right neck continuing into the upper chest most compatible with extensive metastatic lymphadenopathy.  Patient reportedly had IV access extravasation while receiving CT and reportedly only contained IV normal saline fluid and no contrast.    Hospital course:  Metastatic lung cancer Associated right neck, mediastinal, RUL, right renal and right hip masses. Medical and radiation oncology on board with radiation treatment started. Tolerated well. Pain controlled with oral analgesics. Outpatient follow-up with medical and radiation oncology.  Impending SVC syndrome Patient receiving palliative radiation treatment as mentioned above. Patient did not develop symptoms of SVC syndrome. Will need close follow-up.  Acute right IJ vein thrombosis Patient started on IV heparin. Transitioned to Lovenox and given resources to afford it for first month. Patient to pay for subsequent months. Working on Copy.  Dysphagia Secondary to above. Seen by speech therapy. Dysphagia 3 diet ordered   Tobacco use Counseled during admission. Given nicotine patch while inpatient  Thyromegaly Normal TSH. Outpatient follow-up  Discharge Diagnoses:  Principal Problem:   Mass  of right side of neck Active Problems:   Extravasation injury of IV catheter site with other complication (HCC)   Skin rash   Internal jugular (IJ) vein thromboembolism, acute, right  (HCC)   Mediastinal mass   Right renal mass   Hip mass, right   Thyromegaly   Adenocarcinoma of lung (HCC)   Acute deep vein thrombosis (DVT) of non-extremity vein    Discharge Instructions  Discharge Instructions    Ambulatory referral to Speech Therapy   Complete by: As directed    Call MD for:  severe uncontrolled pain   Complete by: As directed    Increase activity slowly   Complete by: As directed      Allergies as of 01/29/2019      Reactions   Pregabalin Other (See Comments)   Homicidal idealizations, pain in lower extremities        Medication List    STOP taking these medications   ibuprofen 200 MG tablet Commonly known as: ADVIL   ibuprofen 800 MG tablet Commonly known as: ADVIL   lidocaine 2 % solution Commonly known as: XYLOCAINE   oxyCODONE-acetaminophen 5-325 MG tablet Commonly known as: PERCOCET/ROXICET     TAKE these medications   enoxaparin 60 MG/0.6ML injection Commonly known as: LOVENOX Inject 0.5 mLs (50 mg total) into the skin 2 (two) times daily.   HYDROcodone-acetaminophen 5-325 MG tablet Commonly known as: NORCO/VICODIN Take 1-2 tablets by mouth every 4 (four) hours as needed for moderate pain.      Follow-up Information    [name], MD. Schedule an appointment as soon as possible for a visit.   Specialty: Family Medicine Contact information: [place] [id number] [facility].   Why: Speak with social worker or fincancial advocate about financial assistance for lovenox at your follow up appointment.           Allergies  Allergen Reactions   Pregabalin Other (See Comments)    Homicidal idealizations, pain in lower extremities      Consultations:  Medical oncology  Radiation oncology   Procedures/Studies: Ct Angio Neck W And/or Wo Contrast  Result Date: 01/20/2019 CLINICAL DATA:  53 year old male with unexplained neck swelling for 3  months. Evidence of right IJ occlusion on thyroid ultrasound earlier today. EXAM: CT ANGIOGRAPHY NECK TECHNIQUE: Multidetector CT imaging of the neck was performed using the standard protocol during bolus administration of intravenous contrast. Multiplanar CT image reconstructions and MIPs were obtained to evaluate the vascular anatomy. Carotid stenosis measurements (when applicable) are obtained utilizing NASCET criteria, using the distal internal carotid diameter as the denominator. CONTRAST:  175mL OMNIPAQUE IOHEXOL 350 MG/ML SOLN COMPARISON:  CTA chest reported separately. Thyroid ultrasound earlier today. FINDINGS: Skeleton: No acute or suspicious osseous lesion from the skull base to the upper chest. Mucous retention cyst in the left maxillary sinus. Mild right mastoid effusion. Upper chest: Mediastinal mass/lymphadenopathy, reported separately today. Other neck: Bulky but indistinct soft tissue masses tracking cephalad in the right neck from the thoracic inlet. Involvement of the right level 4, right level 5, right level 3 and right level 2 nodal stations. Superimposed subcutaneous edema in the region. Estimated individual lymph nodes/soft tissue mass size up to 34 millimeters long axis, 23 millimeters short axis. The level 1 nodal station seems to be spared. The left neck appears relatively spared. Mild leftward mass effect on the trachea and pharynx. The thyroid and  pharyngeal soft tissue contours remain within normal limits. The glottis is closed. The parapharyngeal spaces are normal. And superior retropharyngeal space there is edema/effusion in the lower retropharyngeal space. The sublingual, submandibular and parotid spaces appear spared. Grossly negative visible brain parenchyma. Visualized orbit soft tissues are within normal limits. Aortic arch: 3 vessel arch configuration.  No arch atherosclerosis. Right carotid system: Mass effect on the brachiocephalic artery and to a lesser extent the right  carotid space from the chest and neck soft tissue masses. No associated arterial stenosis. Negative right carotid bifurcation and cervical right ICA. Visible right ICA siphon is normal. Left carotid system: Negative aside from mild left ICA tortuosity in the neck. Negative visible left ICA siphon. Vertebral arteries: Mild mass effect on the proximal right subclavian artery due to the chest and neck soft tissue masses. No right subclavian stenosis. Normal right vertebral artery origin. Patent and normal right vertebral artery to the vertebrobasilar junction. Negative visible basilar artery. Negative proximal left subclavian artery and left vertebral artery origin. Mildly non dominant left vertebral artery is patent and negative to the vertebrobasilar junction. Other findings: Delayed venous phase images were also obtained. The right sigmoid sinus and right IJ bulb are patent at the skull base, but the right jugular vein is occluded beginning at the level of the right retromandibular vein, and continuing to the right subclavian/innominate vein confluence. The right innominate vein is occluded in the upper chest on series 19, image 113. The left innominate and left internal jugular vein remain patent. The left sigmoid and transverse sinus are patent. Review of the MIP images confirms the above findings IMPRESSION: 1. Bulky soft tissue masses in the right neck and continuing into the upper chest most compatible with extensive metastatic lymphadenopathy. Individual nodal masses up to 34 mm. 2. Associated thrombosis of the Right IJ, Right Innominate Vein, and likely also the SVC - See Chest CTA reported separately. 3. Mild retropharyngeal effusion likely related to #2. 4. No arterial abnormality in the neck. Electronically Signed   By: Genevie Ann M.D.   On: 01/20/2019 17:18   Mr Jeri Cos ZJ Contrast  Result Date: 01/23/2019 CLINICAL DATA:  Renal cell cancer, staging. EXAM: MRI HEAD WITHOUT AND WITH CONTRAST TECHNIQUE:  Multiplanar, multiecho pulse sequences of the brain and surrounding structures were obtained without and with intravenous contrast. CONTRAST:  63mL GADAVIST GADOBUTROL 1 MMOL/ML IV SOLN COMPARISON:  CT angiogram neck 01/20/2019 FINDINGS: Brain: Multiple sequences are significantly motion degraded, limiting evaluation. This includes mild motion degradation of postcontrast imaging. No abnormal intracranial enhancement is identified to suggest intracranial metastatic disease. No evidence of acute infarct. No evidence of intracranial mass. No midline shift or extra-axial fluid collection. No chronic intracranial hemorrhage. A few scattered punctate foci of T2/FLAIR hyperintensity within the cerebral white matter are nonspecific, but may reflect minimal chronic small vessel ischemic disease. Cerebral volume is normal for age. Vascular: Flow voids maintained within the proximal large arterial vessels. Skull and upper cervical spine: Normal marrow signal. Sinuses/Orbits: Imaged globes and orbits demonstrate no acute abnormality. Complete T2 hyperintense opacification of the left maxillary sinus. Mild scattered mucosal thickening within the remainder of the paranasal sinuses. Right mastoid effusion. IMPRESSION: Motion degraded examination. No evidence of intracranial metastatic disease. A few scattered punctate signal changes within the cerebral white matter are nonspecific, but may reflect minimal chronic small vessel ischemic disease. Complete T2 hyperintense opacification of the left maxillary sinus, nonspecific but possibly reflecting fluid or a large mucous retention cyst. Right  mastoid effusion. Electronically Signed   By: Kellie Simmering   On: 01/23/2019 10:59   Ct Abdomen Pelvis W Contrast  Result Date: 01/21/2019 CLINICAL DATA:  New diagnosis of mediastinal mass. Evaluate for lymphoma or metastatic disease. EXAM: CT ABDOMEN AND PELVIS WITH CONTRAST TECHNIQUE: Multidetector CT imaging of the abdomen and pelvis was  performed using the standard protocol following bolus administration of intravenous contrast. CONTRAST:  166mL OMNIPAQUE IOHEXOL 300 MG/ML  SOLN COMPARISON:  None. FINDINGS: Lower chest: The unremarkable. Hepatobiliary: Hyperenhancing focus in the anterior left liver likely related to anomalous venous anatomy/flow. Liver otherwise unremarkable. Probable layering sludge in the gallbladder lumen No intrahepatic or extrahepatic biliary dilation. Pancreas: No focal mass lesion. No dilatation of the main duct. No intraparenchymal cyst. No peripancreatic edema. Spleen: No splenomegaly. No focal mass lesion. Adrenals/Urinary Tract: No adrenal nodule or mass. 2 mm nonobstructing stone noted lower pole right kidney. 15 mm subcapsular lesion in the upper pole right kidney has heterogeneous enhancement. Left kidney unremarkable. Ureters not well visualized due to lack of retroperitoneal fat but no hydroureter evident. The urinary bladder appears normal for the degree of distention. Stomach/Bowel: Stomach is unremarkable. No gastric wall thickening. No evidence of outlet obstruction. No small bowel or colonic dilatation. Vascular/Lymphatic: No abdominal aortic aneurysm. No discernible lymphadenopathy in the abdomen or pelvis. Reproductive: The prostate gland and seminal vesicles are unremarkable. Other: No intraperitoneal free fluid. Musculoskeletal: 11 mm sclerotic focus noted posterior left iliac bone. 10 mm sclerotic focus noted in the posterior right acetabulum. Increased attenuation in lower lumbar vertebral bodies likely enhancement related to opacified venous anatomy. IMPRESSION: 1. 15 mm heterogeneously enhancing lesion in the upper pole right kidney. Renal cell carcinoma a concern. MRI without and with contrast recommended to further evaluate. 2. No lymphadenopathy in the abdomen or pelvis. 3. Focal hyperenhancement in the anterior left liver most likely related to aberrant venous anatomy/flow. 4. Sclerotic lesions in  the right acetabulum and left iliac bone are indeterminate. Likely bone islands but attention on follow-up recommended as metastatic cannot be excluded. Electronically Signed   By: Misty Stanley M.D.   On: 01/21/2019 19:41   Ct Biopsy  Result Date: 01/22/2019 CLINICAL DATA:  Mediastinal mass EXAM: CT GUIDED CORE BIOPSY OF MEDIASTINAL MASS ANESTHESIA/SEDATION: Intravenous Fentanyl 11mcg and Versed 1mg  were administered as conscious sedation during continuous monitoring of the patient?s level of consciousness and physiological / cardiorespiratory status by the radiology RN, with a total moderate sedation time of 16 minutes. PROCEDURE: The procedure risks, benefits, and alternatives were explained to the patient. Questions regarding the procedure were encouraged and answered. The patient understands and consents to the procedure. Select axial scans through the thorax were obtained. The anterior mediastinal mass was localized and an appropriate skin entry site was identified and marked. The operative field was prepped with chlorhexidinein a sterile fashion, and a sterile drape was applied covering the operative field. A sterile gown and sterile gloves were used for the procedure. Local anesthesia was provided with 1% Lidocaine. Under CT fluoroscopic guidance, a 17 gauge trocar needle was advanced to the margin of the lesion. Once needle tip position was confirmed, coaxial 18-gauge core biopsy samples were obtained, submitted in saline to surgical pathology. The guide needle was removed. Postprocedure scan shows no hematoma, pneumothorax, or other apparent complication. COMPLICATIONS: None immediate FINDINGS: Anterior mediastinal mass was localized and representative core biopsy samples obtained as above. IMPRESSION: Technically successful CT-guided core biopsy of anterior mediastinal mass. Electronically Signed   By:  Lucrezia Europe M.D.   On: 01/22/2019 16:42   Ct Angio Chest Aorta W/cm &/or Wo/cm  Addendum Date:  01/20/2019   ADDENDUM REPORT: 01/20/2019 20:45 ADDENDUM: Clarification to the report. Thrombosis of the right jugular and brachiocephalic veins and probable subclavian vein. Confluence of these vessels is surrounded/occluded by the large mass. There is suspected tumor occlusion/thrombosis of the distal left brachiocephalic vein and upper SVC. Electronically Signed   By: Donavan Foil M.D.   On: 01/20/2019 20:45   Result Date: 01/20/2019 CLINICAL DATA:  Jugular vein thrombosis EXAM: CT ANGIOGRAPHY CHEST WITH CONTRAST TECHNIQUE: Multidetector CT imaging of the chest was performed using the standard protocol during bolus administration of intravenous contrast. Multiplanar CT image reconstructions and MIPs were obtained to evaluate the vascular anatomy. CONTRAST:  135mL OMNIPAQUE IOHEXOL 350 MG/ML SOLN COMPARISON:  Thyroid ultrasound 01/20/2019 FINDINGS: Cardiovascular: Satisfactory opacification of the pulmonary arteries to the segmental level. No evidence of pulmonary embolism. Nonaneurysmal aorta. No dissection. Normal heart size. No significant pericardial effusion. Occluded right jugular and probable subclavian veins. Narrowed appearance of the left brachiocephalic vein as it approaches the venous confluence. Narrowed and likely subtotal occlusion of the upper SVC. Patency of the SVC as it enters the right atrium. Severe narrowing of right upper lobe pulmonary arterial vessels. Numerous collateral vessels within the mediastinum. Mediastinum/Nodes: Midline trachea.  No discrete thyroid nodule. 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 Mass measures approximately 6.2 cm AP by 5.6 cm transverse by 7.9 cm craniocaudad. It is contiguous with additional mass or nodes in the right hilar region. The mass encases/narrows the right upper lobe pulmonary vessels as well as the superior vena cava. Probable tumor invasion of the distal left brachiocephalic vessel and  presumed tumor occlusion of the right brachiocephalic and jugular vessels. Partially visualized enlarged right supraclavicular nodes. Generalized edema within the soft tissues of the thoracic inlet and base of right neck. Fluid-filled esophagus with mild distal circumferential esophageal thickening. Lungs/Pleura: Emphysema. Presumed left greater than right apical scarring with left apical calcification. Posterior right upper lobe subpleural nodular focus of airspace disease measuring 13 mm, series 6, image number 55. Irregular focus of airspace disease, also within the right upper lobe abutting the fissure, series 6, image number 79. No pleural effusion or pneumothorax. Upper Abdomen: No acute abnormality. Musculoskeletal: No chest wall abnormality. No acute or significant osseous findings. Review of the MIP images confirms the above findings. IMPRESSION: 1. No acute pulmonary embolus or aortic dissection. 2. Large poorly defined malignant-appearing infiltrative mass measuring approximately 6.2 x 5.6 x 7.9 cm within the right anterior and middle mediastinum. Suspected invasion of distal left brachiocephalic vein and probable tumor occlusion of the right subclavian and jugular veins. Suspected tumor invasion of the upper SVC with string like narrowing of the SVC, but with patency of the SVC just above the right atrium. Tumor abuts the aorta and great vessels and also results in significant narrowing of right upper lobe pulmonary arterial vessels. Incompletely visualized right supraclavicular adenopathy. Right hilar nodes and or mass. 3. At least 2 foci of somewhat nodular appearing airspace disease in the right upper lobe, either representing pneumonia or foci of neoplasm. 4. Emphysema Emphysema (ICD10-J43.9). Electronically Signed: By: Donavan Foil M.D. On: 01/20/2019 17:41   US Thyroid  Result Date: 01/20/2019 CLINICAL DATA:  Goiter, neck swelling x3 months EXAM: THYROID ULTRASOUND TECHNIQUE: Ultrasound  examination of the thyroid gland and adjacent soft tissues was performed. COMPARISON:  None. FINDINGS: Parenchymal  Echotexture: Mildly heterogenous Isthmus: 0.3 cm thickness Right lobe: 6 x 2.1 x 2.3 cm Left lobe: 6 x 2 x 1.8 cm _________________________________________________________ Estimated total number of nodules >/= 1 cm: 0 Number of spongiform nodules >/=  2 cm not described below (TR1): 0 Number of mixed cystic and solid nodules >/= 1.5 cm not described below (TR2): 0 _________________________________________________________ No discrete nodules are seen within the thyroid gland. Suspected echogenic thrombus occluding the right IJ vein. IMPRESSION: 1. Thyromegaly without nodule. 2. 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 The above is in keeping with the ACR TI-RADS recommendations - J Am Coll Radiol 2017;14:587-595. Electronically Signed   By: Lucrezia Europe M.D.   On: 01/20/2019 13:27      2 D ECHO 1. The left ventricle has low normal systolic function, with an ejection fraction of 50-55%. The cavity size was normal. Left ventricular diastolic Doppler parameters are consistent withimpaired relaxation. 2. The right ventricle has normal systolic function. The cavity was normal. There is no increase in right ventricular wall thickness. 3. The mitral valve is degenerative. Moderate thickening of the mitral valve leaflet. 4. The aortic valve is tricuspid. 5. The aorta is normal unless otherwise noted. 6. No intracardiac thrombi or masses were visualized.   Subjective: Some neck/upper right chest discomfort from radiation.  Discharge Exam: Vitals:   01/29/19 1329 01/29/19 1520  BP: 91/65 105/76  Pulse: (!) 101 (!) 103  Resp: 18 20  Temp:  98.4 F (36.9 C)  SpO2: 92% 100%   Vitals:   01/29/19 0658 01/29/19 1322 01/29/19 1329 01/29/19 1520  BP: 95/76 (!) 92/58 91/65 105/76  Pulse: 100 (!) 102 (!) 101 (!) 103  Resp: 18 17 18 20   Temp: 98.3 F (36.8 C) 98.6 F (37 C)  98.4 F  (36.9 C)  TempSrc: Oral Oral  Oral  SpO2: 93% 95% 92% 100%  Weight:      Height:        General exam: Appears calm and comfortable Respiratory system: Clear to auscultation. Respiratory effort normal. Cardiovascular system: S1 & S2 heard, RRR. No murmurs, rubs, gallops or clicks. Gastrointestinal system: Abdomen is nondistended, soft and nontender. No organomegaly or masses felt. Normal bowel sounds heard. Central nervous system: Alert and oriented. No focal neurological deficits. Extremities: No edema. No calf tenderness Skin: No cyanosis. No rashes Psychiatry: Judgement and insight appear normal. Mood & affect appropriate.    The results of significant diagnostics from this hospitalization (including imaging, microbiology, ancillary and laboratory) are listed below for reference.     Microbiology: Recent Results (from the past 240 hour(s))  SARS Coronavirus 2 [facility] order, Performed in [facility] hospital lab) Nasopharyngeal Nasopharyngeal Swab     Status: None   Collection Time: 01/20/19  6:30 PM   Specimen: Nasopharyngeal Swab  Result Value Ref Range Status   SARS Coronavirus 2 NEGATIVE NEGATIVE Final    Comment: (NOTE) If result is NEGATIVE SARS-CoV-2 target nucleic acids are NOT DETECTED. The SARS-CoV-2 RNA is generally detectable in upper and lower  respiratory specimens during the acute phase of infection. The lowest  concentration of SARS-CoV-2 viral copies this assay can detect is 250  copies / mL. A negative result does not preclude SARS-CoV-2 infection  and should not be used as the sole basis for treatment or other  patient management decisions.  A negative result may occur with  improper specimen collection / handling, submission of specimen other  than nasopharyngeal swab, presence of viral mutation(s) within the  areas targeted by this assay, and inadequate number of viral copies  (<250 copies / mL). A negative result must be combined with clinical    observations, patient history, and epidemiological information. If result is POSITIVE SARS-CoV-2 target nucleic acids are DETECTED. The SARS-CoV-2 RNA is generally detectable in upper and lower  respiratory specimens dur ing the acute phase of infection.  Positive  results are indicative of active infection with SARS-CoV-2.  Clinical  correlation with patient history and other diagnostic information is  necessary to determine patient infection status.  Positive results do  not rule out bacterial infection or co-infection with other viruses. If result is PRESUMPTIVE POSTIVE SARS-CoV-2 nucleic acids MAY BE PRESENT.   A presumptive positive result was obtained on the submitted specimen  and confirmed on repeat testing.  While 2019 novel coronavirus  (SARS-CoV-2) nucleic acids may be present in the submitted sample  additional confirmatory testing may be necessary for epidemiological  and / or clinical management purposes  to differentiate between  SARS-CoV-2 and other Sarbecovirus currently known to infect humans.  If clinically indicated additional testing with an alternate test  methodology [phone]) is advised. The SARS-CoV-2 RNA is generally  detectable in upper and lower respiratory sp ecimens during the acute  phase of infection. The expected result is Negative. Fact Sheet for Patients:  StrictlyIdeas.no Fact Sheet for Healthcare Providers: BankingDealers.co.za This test is not yet approved or cleared by the [place] FDA and has been authorized for detection and/or diagnosis of SARS-CoV-2 by FDA under an Emergency Use Authorization (EUA).  This EUA will remain in effect (meaning this test can be used) for the duration of the COVID-19 declaration under Section 564(b)(1) of the Act, 21 U.S.C. section 360bbb-3(b)(1), unless the authorization is terminated or revoked sooner. Performed at [facility], [place] [address],  [place], [place] [address]   Surgical PCR screen     Status: Abnormal   Collection Time: 01/22/19  6:01 AM   Specimen: Nasal Mucosa; Nasal Swab  Result Value Ref Range Status   MRSA, PCR NEGATIVE NEGATIVE Final   Staphylococcus aureus POSITIVE (A) NEGATIVE Final    Comment: (NOTE) The Xpert SA Assay (FDA approved for NASAL specimens in patients 55 years of age and older), is one component of a comprehensive surveillance program. It is not intended to diagnose infection nor to guide or monitor treatment. Performed at [facility], [place] [address], [place], [place] [address]      Labs: BNP (last 3 results) No results for input(s): BNP in the last 8760 hours. Basic Metabolic Panel: Recent Labs  Lab 01/27/19 0425  NA 135  K 3.8  CL 103  CO2 23  GLUCOSE 106*  BUN 9  CREATININE 0.67  CALCIUM 9.6   Liver Function Tests: No results for input(s): AST, ALT, ALKPHOS, BILITOT, PROT, ALBUMIN in the last 168 hours. No results for input(s): LIPASE, AMYLASE in the last 168 hours. No results for input(s): AMMONIA in the last 168 hours. CBC: Recent Labs  Lab 01/23/19 0552 01/24/19 0151 01/25/19 0122 01/26/19 1004 01/29/19 0535  WBC 5.3 6.1 7.0 11.3* 6.9  HGB 11.3* 10.9* 10.8* 11.7* 10.4*  HCT 34.7* 33.1* 34.1* 37.4* 33.0*  MCV 83.6 83.4 84.0 86.2 85.9  PLT 523* 482* 474* 520* 490*   Cardiac Enzymes: No results for input(s): CKTOTAL, CKMB, CKMBINDEX, TROPONINI in the last 168 hours. BNP: Invalid input(s): POCBNP CBG: No results for input(s): GLUCAP in the last 168 hours. D-Dimer No  results for input(s): DDIMER in the last 72 hours. Hgb A1c No results for input(s): HGBA1C in the last 72 hours. Lipid Profile No results for input(s): CHOL, HDL, LDLCALC, TRIG, CHOLHDL, LDLDIRECT in the last 72 hours. Thyroid function studies No results for input(s): TSH, T4TOTAL, T3FREE, THYROIDAB in the last 72 hours.  Invalid input(s): FREET3 Anemia work up No results for input(s):  VITAMINB12, FOLATE, FERRITIN, TIBC, IRON, RETICCTPCT in the last 72 hours. Urinalysis No results found for: COLORURINE, APPEARANCEUR, LABSPEC, [place], GLUCOSEU, [place], [place], KETONESUR, PROTEINUR, UROBILINOGEN, NITRITE, LEUKOCYTESUR Sepsis Labs Invalid input(s): PROCALCITONIN,  WBC,  [place] [facility] Recent Results (from the past 240 hour(s))  SARS Coronavirus 2 [facility] order, Performed in [facility] hospital lab) Nasopharyngeal Nasopharyngeal Swab     Status: None   Collection Time: 01/20/19  6:30 PM   Specimen: Nasopharyngeal Swab  Result Value Ref Range Status   SARS Coronavirus 2 NEGATIVE NEGATIVE Final    Comment: (NOTE) If result is NEGATIVE SARS-CoV-2 target nucleic acids are NOT DETECTED. The SARS-CoV-2 RNA is generally detectable in upper and lower  respiratory specimens during the acute phase of infection. The lowest  concentration of SARS-CoV-2 viral copies this assay can detect is 250  copies / mL. A negative result does not preclude SARS-CoV-2 infection  and should not be used as the sole basis for treatment or other  patient management decisions.  A negative result may occur with  improper specimen collection / handling, submission of specimen other  than nasopharyngeal swab, presence of viral mutation(s) within the  areas targeted by this assay, and inadequate number of viral copies  (<250 copies / mL). A negative result must be combined with clinical  observations, patient history, and epidemiological information. If result is POSITIVE SARS-CoV-2 target nucleic acids are DETECTED. The SARS-CoV-2 RNA is generally detectable in upper and lower  respiratory specimens dur ing the acute phase of infection.  Positive  results are indicative of active infection with SARS-CoV-2.  Clinical  correlation with patient history and other diagnostic information is  necessary to determine patient infection status.  Positive results do  not rule out bacterial  infection or co-infection with other viruses. If result is PRESUMPTIVE POSTIVE SARS-CoV-2 nucleic acids MAY BE PRESENT.   A presumptive positive result was obtained on the submitted specimen  and confirmed on repeat testing.  While 2019 novel coronavirus  (SARS-CoV-2) nucleic acids may be present in the submitted sample  additional confirmatory testing may be necessary for epidemiological  and / or clinical management purposes  to differentiate between  SARS-CoV-2 and other Sarbecovirus currently known to infect humans.  If clinically indicated additional testing with an alternate test  methodology [phone]) is advised. The SARS-CoV-2 RNA is generally  detectable in upper and lower respiratory sp ecimens during the acute  phase of infection. The expected result is Negative. Fact Sheet for Patients:  StrictlyIdeas.no Fact Sheet for Healthcare Providers: BankingDealers.co.za This test is not yet approved or cleared by the [place] FDA and has been authorized for detection and/or diagnosis of SARS-CoV-2 by FDA under an Emergency Use Authorization (EUA).  This EUA will remain in effect (meaning this test can be used) for the duration of the COVID-19 declaration under Section 564(b)(1) of the Act, 21 U.S.C. section 360bbb-3(b)(1), unless the authorization is terminated or revoked sooner. Performed at [facility], [place] [address] [place], [place] [address]   Surgical PCR screen     Status: Abnormal   Collection Time: 01/22/19  6:01 AM   Specimen: Nasal Mucosa; Nasal Swab  Result Value Ref Range Status   MRSA, PCR NEGATIVE NEGATIVE Final   Staphylococcus aureus POSITIVE (A) NEGATIVE Final    Comment: (NOTE) The Xpert SA Assay (FDA approved for NASAL specimens in patients 68 years of age and older), is one component of a comprehensive surveillance program. It is not intended to diagnose infection nor to guide or monitor  treatment. Performed at [facility], [place] [address] [place], [place] [address]      Time coordinating discharge: 35 minutes  SIGNED:   Cordelia Poche, MD [facility] 01/29/2019, 5:24 PM

## 2019-01-29 NOTE — Progress Notes (Signed)
PROGRESS NOTE    David Berry  ZJI:[id number] DOB: [birth year] DOA: 01/20/2019 PCP: Patient, No Pcp Per   Brief Narrative: David Berry is a 53 y.o. 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 Patient presented secondary to neck swelling and pain and found to have multiple masses initially concerning for cancer. Associated IJ vein thrombosis, started on heparin.   Assessment & Plan:   Principal Problem:   Mass of right side of neck Active Problems:   Extravasation injury of IV catheter site with other complication (HCC)   Skin rash   Internal jugular (IJ) vein thromboembolism, acute, right (HCC)   Mediastinal mass   Right renal mass   Hip mass, right   Thyromegaly   Adenocarcinoma of lung (HCC)   Acute deep vein thrombosis (DVT) of non-extremity vein   Metastatic lung cancer Associated right neck, mediastinal, RUL, right renal and right hip masses. Medical and radiation oncology on board with plans for radiation treatment. Pain controlled.  Impending SVC syndrome Patient receiving palliative radiation treatment.  Acute right IJ vein thrombosis Patient started on IV heparin. Transitioned to Lovenox; CM working diligently to find a way for patient to obtain Lovenox outpatient consistently -Continue Lovenox  Dysphagia Secondary to above. Seen by speech therapy. Dysphagia 3 diet ordered   Tobacco use Counseled during admission -Continue nicotine patch  Thyromegaly Normal TSH. Outpatient follow-up   DVT prophylaxis: Heparin drip Code Status:   Code Status: Full Code Family Communication: None at bedside Disposition Plan: Discharge pending radiation treatment and recommendations from radiation oncology. Also needs to be able to obtain Lovenox outpatient.   Consultants:   CT surgery  ENT  Pulm  IR  Oncology  Procedures:   2 D ECHO 1. The left ventricle has low normal systolic  function, with an ejection fraction of 50-55%. The cavity size was normal. Left ventricular diastolic Doppler parameters are consistent with impaired relaxation. 2. The right ventricle has normal systolic function. The cavity was normal. There is no increase in right ventricular wall thickness. 3. The mitral valve is degenerative. Moderate thickening of the mitral valve leaflet. 4. The aortic valve is tricuspid. 5. The aorta is normal unless otherwise noted. 6. No intracardiac thrombi or masses were visualized.  Antimicrobials:  Zosyn (9/13>>9/15)    Subjective: Some neck/upper right chest discomfort from radiation.  Objective: Vitals:   01/27/19 2217 01/28/19 1312 01/28/19 2354 01/29/19 0658  BP: 120/81 94/70 (!) 87/59 95/76  Pulse: (!) 103 96 85 100  Resp: 18 16 17 18   Temp: 98 F (36.7 C) 98.6 F (37 C) 98 F (36.7 C) 98.3 F (36.8 C)  TempSrc: Oral Oral Oral Oral  SpO2: 95% 92% 95% 93%  Weight:      Height:        Intake/Output Summary (Last 24 hours) at 01/29/2019 1232 Last data filed at 01/29/2019 1224 Gross per 24 hour  Intake 480 ml  Output 1 ml  Net 479 ml   Filed Weights   01/20/19 2000 01/20/19 2100  Weight: 52.2 kg 52 kg    Examination:  General exam: Appears calm and comfortable Respiratory system: Clear to auscultation. Respiratory effort normal. Cardiovascular system: S1 & S2 heard, RRR. No murmurs, rubs, gallops or clicks. Gastrointestinal system: Abdomen is nondistended, soft and nontender. No organomegaly or masses felt. Normal bowel sounds heard. Central nervous system: Alert and oriented. No focal neurological deficits. Extremities: No edema. No calf tenderness  Skin: No cyanosis. No rashes Psychiatry: Judgement and insight appear normal. Mood & affect appropriate.    Data Reviewed: I have personally reviewed following labs and imaging studies  CBC: Recent Labs  Lab 01/23/19 0552 01/24/19 0151 01/25/19 0122 01/26/19 1004 01/29/19  0535  WBC 5.3 6.1 7.0 11.3* 6.9  HGB 11.3* 10.9* 10.8* 11.7* 10.4*  HCT 34.7* 33.1* 34.1* 37.4* 33.0*  MCV 83.6 83.4 84.0 86.2 85.9  PLT 523* 482* 474* 520* 878*   Basic Metabolic Panel: Recent Labs  Lab 01/27/19 0425  NA 135  K 3.8  CL 103  CO2 23  GLUCOSE 106*  BUN 9  CREATININE 0.67  CALCIUM 9.6   GFR: Estimated Creatinine Clearance: 79.4 mL/min (by C-G formula based on SCr of 0.67 mg/dL). Liver Function Tests: No results for input(s): AST, ALT, ALKPHOS, BILITOT, PROT, ALBUMIN in the last 168 hours. No results for input(s): LIPASE, AMYLASE in the last 168 hours. No results for input(s): AMMONIA in the last 168 hours. Coagulation Profile: No results for input(s): INR, PROTIME in the last 168 hours. Cardiac Enzymes: No results for input(s): CKTOTAL, CKMB, CKMBINDEX, TROPONINI in the last 168 hours. BNP (last 3 results) No results for input(s): PROBNP in the last 8760 hours. HbA1C: No results for input(s): HGBA1C in the last 72 hours. CBG: No results for input(s): GLUCAP in the last 168 hours. Lipid Profile: No results for input(s): CHOL, HDL, LDLCALC, TRIG, CHOLHDL, LDLDIRECT in the last 72 hours. Thyroid Function Tests: No results for input(s): TSH, T4TOTAL, FREET4, T3FREE, THYROIDAB in the last 72 hours. Anemia Panel: No results for input(s): VITAMINB12, FOLATE, FERRITIN, TIBC, IRON, RETICCTPCT in the last 72 hours. Sepsis Labs: No results for input(s): PROCALCITON, LATICACIDVEN in the last 168 hours.  Recent Results (from the past 240 hour(s))  SARS Coronavirus 2 [facility] order, Performed in [facility] hospital lab) Nasopharyngeal Nasopharyngeal Swab     Status: None   Collection Time: 01/20/19  6:30 PM   Specimen: Nasopharyngeal Swab  Result Value Ref Range Status   SARS Coronavirus 2 NEGATIVE NEGATIVE Final    Comment: (NOTE) If result is NEGATIVE SARS-CoV-2 target nucleic acids are NOT DETECTED. The SARS-CoV-2 RNA is generally detectable in upper and  lower  respiratory specimens during the acute phase of infection. The lowest  concentration of SARS-CoV-2 viral copies this assay can detect is 250  copies / mL. A negative result does not preclude SARS-CoV-2 infection  and should not be used as the sole basis for treatment or other  patient management decisions.  A negative result may occur with  improper specimen collection / handling, submission of specimen other  than nasopharyngeal swab, presence of viral mutation(s) within the  areas targeted by this assay, and inadequate number of viral copies  (<250 copies / mL). A negative result must be combined with clinical  observations, patient history, and epidemiological information. If result is POSITIVE SARS-CoV-2 target nucleic acids are DETECTED. The SARS-CoV-2 RNA is generally detectable in upper and lower  respiratory specimens dur ing the acute phase of infection.  Positive  results are indicative of active infection with SARS-CoV-2.  Clinical  correlation with patient history and other diagnostic information is  necessary to determine patient infection status.  Positive results do  not rule out bacterial infection or co-infection with other viruses. If result is PRESUMPTIVE POSTIVE SARS-CoV-2 nucleic acids MAY BE PRESENT.   A presumptive positive result was obtained on the submitted specimen  and confirmed on repeat testing.  While 2019 novel coronavirus  (SARS-CoV-2) nucleic acids may be present in the submitted sample  additional confirmatory testing may be necessary for epidemiological  and / or clinical management purposes  to differentiate between  SARS-CoV-2 and other Sarbecovirus currently known to infect humans.  If clinically indicated additional testing with an alternate test  methodology [phone]) is advised. The SARS-CoV-2 RNA is generally  detectable in upper and lower respiratory sp ecimens during the acute  phase of infection. The expected result is Negative.  Fact Sheet for Patients:  StrictlyIdeas.no Fact Sheet for Healthcare Providers: BankingDealers.co.za This test is not yet approved or cleared by the [place] FDA and has been authorized for detection and/or diagnosis of SARS-CoV-2 by FDA under an Emergency Use Authorization (EUA).  This EUA will remain in effect (meaning this test can be used) for the duration of the COVID-19 declaration under Section 564(b)(1) of the Act, 21 U.S.C. section 360bbb-3(b)(1), unless the authorization is terminated or revoked sooner. Performed at [facility], [place] [address], [place], [place] [address]   Surgical PCR screen     Status: Abnormal   Collection Time: 01/22/19  6:01 AM   Specimen: Nasal Mucosa; Nasal Swab  Result Value Ref Range Status   MRSA, PCR NEGATIVE NEGATIVE Final   Staphylococcus aureus POSITIVE (A) NEGATIVE Final    Comment: (NOTE) The Xpert SA Assay (FDA approved for NASAL specimens in patients 75 years of age and older), is one component of a comprehensive surveillance program. It is not intended to diagnose infection nor to guide or monitor treatment. Performed at [facility], [place] [address] [place], [place] [address]          Radiology Studies: No results found.      Scheduled Meds: . enoxaparin (LOVENOX) injection  50 mg Subcutaneous Q12H  . feeding supplement (ENSURE ENLIVE)  237 mL Oral TID BM  . multivitamin with minerals  1 tablet Oral Daily   Continuous Infusions: . sodium chloride 10 mL/hr at 01/27/19 2313     LOS: 9 days     Cordelia Poche, MD [facility] 01/29/2019, 12:32 PM  If 7PM-7AM, please contact night-coverage [email or web address]

## 2019-01-29 NOTE — Discharge Instructions (Signed)
David Berry,  You were in the hospital because of a neck mas which was found to be secondary to metastatic lung cancer. You also have a blood clot in one of your neck veins. You have been prescribed Lovenox. Please take this every day as prescribed. You have been told the cost of this medication and have agreed to make continued payments with regard to affording this medication. Please follow-up with your oncologist and the radiation oncologist.

## 2019-01-29 NOTE — TOC Initial Note (Signed)
Transition of Care [facility]) - Initial/Assessment Note    Patient Details  Name: David Berry MRN: [id number] Date of Birth: [birth year]  Transition of Care [facility]) CM/SW Contact:    David Berry, David Skiff, RN Phone Number: 01/29/2019, 1:33 PM  Clinical Narrative:                 Pt is without insurance and will need Lovenox on DC. Pt has not used [place] letter in the past and would be eligible for 30 days of Lovenox through the [facility] program. This CM contacted [place] at the [facility] about continued financial assistance after the 30 day supply. David Berry states that pt will need to work with the Estate manager/land agent and pharmacy for assistance. Pt can also apply for a grant which would help with some costs. David Berry states that she will put David Berry on their radar to help with this when he comes in for his follow up appointment.   Expected Discharge Plan: Home/Self Care Barriers to Discharge: Continued Medical Work up   Patient Goals and CMS Choice Patient states their goals for this hospitalization and ongoing recovery are:: to go home CMS Medicare.gov Compare Post Acute Care list provided to:: Patient    Expected Discharge Plan and Services Expected Discharge Plan: Home/Self Care   Discharge Planning Services: CM Consult   Living arrangements for the past 2 months: Apartment                 DME Arranged: N/A         HH Arranged: NA          Prior Living Arrangements/Services Living arrangements for the past 2 months: Apartment Lives with:: Self Patient language and need for interpreter reviewed:: Yes Do you feel safe going back to the place where you live?: Yes      Need for Family Participation in Patient Care: Yes (Comment) Care giver support system in place?: Yes (comment)   Criminal Activity/Legal Involvement Pertinent to Current Situation/Hospitalization: No - Comment as needed  Activities of Daily Living Home Assistive Devices/Equipment: None ADL Screening  (condition at time of admission) Patient's cognitive ability adequate to safely complete daily activities?: Yes Is the patient deaf or have difficulty hearing?: No Does the patient have difficulty seeing, even when wearing glasses/contacts?: No Does the patient have difficulty concentrating, remembering, or making decisions?: No Patient able to express need for assistance with ADLs?: Yes Does the patient have difficulty dressing or bathing?: No Independently performs ADLs?: Yes (appropriate for developmental age) Does the patient have difficulty walking or climbing stairs?: No Weakness of Legs: None Weakness of Arms/Hands: None  Permission Sought/Granted   Permission granted to share information with : No              Emotional Assessment Appearance:: Appears stated age   Affect (typically observed): Accepting Orientation: : Oriented to Self, Oriented to Place, Oriented to  Time, Oriented to Situation Alcohol / Substance Use: Not Applicable Psych Involvement: No (comment)  Admission diagnosis:  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 Acute deep vein thrombosis (DVT) of non-extremity vein   . Adenocarcinoma of lung ([place])   . 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 Internal jugular (IJ) vein thromboembolism, acute, right ([place]) 01/24/2019  . Mass of right side of neck 01/24/2019  . 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  . Right renal mass 01/24/2019  . Hip mass, right 01/24/2019  . Thyromegaly 01/24/2019  .  Extravasation injury of IV catheter site with other complication ([place])   . Skin rash   . BACK PAIN, LUMBAR 03/24/2010  . LUMBAR RADICULOPATHY 03/24/2010   PCP:  Patient, No Pcp Per Pharmacy:   [id number] - [place], [place] [address] [place] [address] Phone: [phone] Fax: [phone]  [facility] [place], [place]  [address] [place] [address] Phone: [phone] Fax: [phone]     Social Determinants of Health (SDOH) Interventions    Readmission Risk Interventions No flowsheet data found.

## 2019-01-29 NOTE — TOC Progression Note (Signed)
Transition of Care [facility]) - Progression Note    Patient Details  Name: David Berry MRN: [id number] Date of Birth: 1965-07-10  [facility]) CM/SW Contact  Joaquin Courts, RN Phone Number: 01/29/2019, 4:12 PM  Clinical Narrative:    Match letter provided.   Expected Discharge Plan: Home/Self Care Barriers to Discharge: Continued Medical Work up  Expected Discharge Plan and Services Expected Discharge Plan: Home/Self Care   Discharge Planning Services: CM Consult   Living arrangements for the past 2 months: Apartment                 DME Arranged: N/A         HH Arranged: NA           Social Determinants of Health (SDOH) Interventions    Readmission Risk Interventions No flowsheet data found.

## 2019-01-29 NOTE — Progress Notes (Signed)
  Speech Language Pathology Treatment: Dysphagia  Patient Details Name: David Berry MRN: [id number] DOB: 07/08/1965 Today's Date: 01/29/2019 Time: 1500-1520 SLP Time Calculation (min) (ACUTE ONLY): 20 min  Assessment / Plan / Recommendation Clinical Impression  Pt seen at bedside for assessment of diet tolerance and education. No difficulty swallowing observed or reported. Pt anticipating DC soon - waiting for MD visit. Given pt radiation treatments and possible effect of radiation on swallowing mechanism, outpatient speech therapy is recommended for dysphagia therapy to minimize aspiration risk and maximize continued swallow safety.   HPI HPI: 53 year old male admitted 01/20/2019 with progressive right arm, chest pain with new right sided neck swelling. Pt to start radiation treatment 01/26/2019      SLP Plan  Discharge acute SLP treatment due to goals met. Outpatient speech therapy is recommended at discharge.       Recommendations  Diet recommendations: Dysphagia 3 (mechanical soft);Thin liquid Liquids provided via: Straw;Cup Medication Administration: (per pt preference) Supervision: Patient able to self feed Compensations: Follow solids with liquid;Effortful swallow;Slow rate;Small sips/bites Postural Changes and/or Swallow Maneuvers: Seated upright 90 degrees   Outpatient therapy             Oral Care Recommendations: Oral care BID Follow up Recommendations: Outpatient SLP SLP Visit Diagnosis: Dysphagia, unspecified (R13.10) Plan: All goals met;Discharge SLP treatment due to (comment)       GO           David Berry, [facility], CCC-SLP Speech Language Pathologist   Shonna Chock 01/29/2019, 3:21 PM

## 2019-01-29 NOTE — Progress Notes (Signed)
Pt discharged home in stable condition. Discharge instructions given. Script given and other script sent to pharmacy of choice. No immediate questions or concerns at this time. Pt discharged from unit via wheelchair.

## 2019-01-30 ENCOUNTER — Ambulatory Visit
Admission: RE | Admit: 2019-01-30 | Discharge: 2019-01-30 | Disposition: A | Discharge status: Home / Self Care | Payer: Medicaid Other | Source: Ambulatory Visit | Attending: Radiation Oncology | Admitting: Radiation Oncology

## 2019-01-30 ENCOUNTER — Other Ambulatory Visit: Payer: Self-pay

## 2019-01-30 DIAGNOSIS — C3491 Malignant neoplasm of unspecified part of right bronchus or lung: Secondary | ICD-10-CM | POA: Diagnosis not present

## 2019-01-30 DIAGNOSIS — C3411 Malignant neoplasm of upper lobe, right bronchus or lung: Secondary | ICD-10-CM | POA: Diagnosis not present

## 2019-01-30 DIAGNOSIS — Z510 Encounter for antineoplastic radiation therapy: Secondary | ICD-10-CM | POA: Insufficient documentation

## 2019-01-30 DIAGNOSIS — I871 Compression of vein: Secondary | ICD-10-CM | POA: Diagnosis not present

## 2019-01-30 MED FILL — ENOXAPARIN 60 MG/0.6 ML SYR: 60 | 30 days supply | Qty: 36 | Fill #0

## 2019-01-31 ENCOUNTER — Ambulatory Visit
Admission: RE | Admit: 2019-01-31 | Discharge: 2019-01-31 | Disposition: A | Discharge status: Home / Self Care | Payer: Medicaid Other | Source: Ambulatory Visit | Attending: Radiation Oncology | Admitting: Radiation Oncology

## 2019-01-31 ENCOUNTER — Other Ambulatory Visit: Payer: Self-pay

## 2019-01-31 DIAGNOSIS — Z510 Encounter for antineoplastic radiation therapy: Secondary | ICD-10-CM | POA: Diagnosis not present

## 2019-01-31 DIAGNOSIS — C3491 Malignant neoplasm of unspecified part of right bronchus or lung: Secondary | ICD-10-CM | POA: Diagnosis not present

## 2019-01-31 DIAGNOSIS — C3411 Malignant neoplasm of upper lobe, right bronchus or lung: Secondary | ICD-10-CM | POA: Diagnosis not present

## 2019-01-31 DIAGNOSIS — I871 Compression of vein: Secondary | ICD-10-CM | POA: Diagnosis not present

## 2019-02-01 ENCOUNTER — Ambulatory Visit
Admission: RE | Admit: 2019-02-01 | Discharge: 2019-02-01 | Disposition: A | Discharge status: Home / Self Care | Payer: Medicaid Other | Source: Ambulatory Visit | Attending: Radiation Oncology | Admitting: Radiation Oncology

## 2019-02-01 DIAGNOSIS — C3411 Malignant neoplasm of upper lobe, right bronchus or lung: Secondary | ICD-10-CM | POA: Diagnosis not present

## 2019-02-01 DIAGNOSIS — C3491 Malignant neoplasm of unspecified part of right bronchus or lung: Secondary | ICD-10-CM | POA: Diagnosis not present

## 2019-02-01 DIAGNOSIS — I871 Compression of vein: Secondary | ICD-10-CM | POA: Diagnosis not present

## 2019-02-01 DIAGNOSIS — Z510 Encounter for antineoplastic radiation therapy: Secondary | ICD-10-CM | POA: Diagnosis not present

## 2019-02-02 ENCOUNTER — Other Ambulatory Visit: Payer: Self-pay

## 2019-02-02 ENCOUNTER — Encounter (HOSPITAL_COMMUNITY): Payer: Self-pay | Admitting: Hematology

## 2019-02-02 ENCOUNTER — Ambulatory Visit
Admission: RE | Admit: 2019-02-02 | Discharge: 2019-02-02 | Disposition: A | Discharge status: Home / Self Care | Payer: Medicaid Other | Source: Ambulatory Visit | Attending: Radiation Oncology | Admitting: Radiation Oncology

## 2019-02-02 ENCOUNTER — Inpatient Hospital Stay: Payer: Self-pay | Admitting: Hematology

## 2019-02-02 DIAGNOSIS — C3491 Malignant neoplasm of unspecified part of right bronchus or lung: Secondary | ICD-10-CM | POA: Diagnosis not present

## 2019-02-02 DIAGNOSIS — C3411 Malignant neoplasm of upper lobe, right bronchus or lung: Secondary | ICD-10-CM | POA: Diagnosis not present

## 2019-02-02 DIAGNOSIS — Z510 Encounter for antineoplastic radiation therapy: Secondary | ICD-10-CM | POA: Diagnosis not present

## 2019-02-02 DIAGNOSIS — I871 Compression of vein: Secondary | ICD-10-CM | POA: Diagnosis not present

## 2019-02-05 ENCOUNTER — Ambulatory Visit
Admission: RE | Admit: 2019-02-05 | Discharge: 2019-02-05 | Disposition: A | Discharge status: Home / Self Care | Payer: Medicaid Other | Source: Ambulatory Visit | Attending: Radiation Oncology | Admitting: Radiation Oncology

## 2019-02-05 ENCOUNTER — Telehealth: Payer: Self-pay | Admitting: Hematology

## 2019-02-05 ENCOUNTER — Inpatient Hospital Stay: Payer: Medicaid Other | Attending: Hematology | Admitting: Hematology

## 2019-02-05 ENCOUNTER — Other Ambulatory Visit: Payer: Self-pay

## 2019-02-05 VITALS — BP 92/67 | HR 91 | Temp 97.6°F | Resp 18 | Ht 71.0 in | Wt 127.4 lb

## 2019-02-05 DIAGNOSIS — J439 Emphysema, unspecified: Secondary | ICD-10-CM | POA: Insufficient documentation

## 2019-02-05 DIAGNOSIS — C3491 Malignant neoplasm of unspecified part of right bronchus or lung: Secondary | ICD-10-CM

## 2019-02-05 DIAGNOSIS — E440 Moderate protein-calorie malnutrition: Secondary | ICD-10-CM

## 2019-02-05 DIAGNOSIS — C778 Secondary and unspecified malignant neoplasm of lymph nodes of multiple regions: Secondary | ICD-10-CM | POA: Diagnosis not present

## 2019-02-05 DIAGNOSIS — D649 Anemia, unspecified: Secondary | ICD-10-CM | POA: Insufficient documentation

## 2019-02-05 DIAGNOSIS — Z79899 Other long term (current) drug therapy: Secondary | ICD-10-CM | POA: Insufficient documentation

## 2019-02-05 DIAGNOSIS — C3411 Malignant neoplasm of upper lobe, right bronchus or lung: Secondary | ICD-10-CM | POA: Insufficient documentation

## 2019-02-05 DIAGNOSIS — E010 Iodine-deficiency related diffuse (endemic) goiter: Secondary | ICD-10-CM | POA: Insufficient documentation

## 2019-02-05 DIAGNOSIS — Z8042 Family history of malignant neoplasm of prostate: Secondary | ICD-10-CM | POA: Insufficient documentation

## 2019-02-05 DIAGNOSIS — K642 Third degree hemorrhoids: Secondary | ICD-10-CM

## 2019-02-05 DIAGNOSIS — K648 Other hemorrhoids: Secondary | ICD-10-CM | POA: Insufficient documentation

## 2019-02-05 DIAGNOSIS — Z9221 Personal history of antineoplastic chemotherapy: Secondary | ICD-10-CM | POA: Diagnosis not present

## 2019-02-05 DIAGNOSIS — F17210 Nicotine dependence, cigarettes, uncomplicated: Secondary | ICD-10-CM | POA: Diagnosis not present

## 2019-02-05 DIAGNOSIS — D473 Essential (hemorrhagic) thrombocythemia: Secondary | ICD-10-CM | POA: Diagnosis not present

## 2019-02-05 DIAGNOSIS — I82621 Acute embolism and thrombosis of deep veins of right upper extremity: Secondary | ICD-10-CM

## 2019-02-05 DIAGNOSIS — Z510 Encounter for antineoplastic radiation therapy: Secondary | ICD-10-CM | POA: Diagnosis not present

## 2019-02-05 DIAGNOSIS — Z7189 Other specified counseling: Secondary | ICD-10-CM

## 2019-02-05 DIAGNOSIS — I82401 Acute embolism and thrombosis of unspecified deep veins of right lower extremity: Secondary | ICD-10-CM | POA: Insufficient documentation

## 2019-02-05 DIAGNOSIS — I871 Compression of vein: Secondary | ICD-10-CM | POA: Diagnosis not present

## 2019-02-05 MED ORDER — PHENYLEPH-SHARK LIV OIL-MO-PET 0.25-3-14-71.9 % RE OINT: 1.0000 "application " | TOPICAL_OINTMENT | Freq: Two times a day (BID) | RECTAL | 0 refills | Status: DC | PRN

## 2019-02-05 MED ORDER — DOCUSATE SODIUM 100 MG PO CAPS: 100.0000 mg | ORAL_CAPSULE | Freq: Two times a day (BID) | ORAL | 0 refills | Status: DC

## 2019-02-05 NOTE — Progress Notes (Signed)
START ON PATHWAY REGIMEN - Non-Small Cell Lung     Administer weekly:     Paclitaxel      Carboplatin   **Always confirm dose/schedule in your pharmacy ordering system**  Patient Characteristics: Stage III - Unresectable, PS = 0, 1 AJCC T Category: Staged < 8th Ed. Current Disease Status: No Distant Mets or Local Recurrence AJCC N Category: Staged < 8th Ed. AJCC M Category: Staged < 8th Ed. AJCC 8 Stage Grouping: Staged < 8th Ed. ECOG Performance Status: 1 Intent of Therapy: Non-Curative / Palliative Intent, Discussed with Patient

## 2019-02-05 NOTE — Telephone Encounter (Signed)
Scheduled appt per 9/28 los.  Spoke with pt and we scheduled each appt over the phone.  The patient is aware of his appt dates and time.

## 2019-02-05 NOTE — Progress Notes (Signed)
HEMATOLOGY/ONCOLOGY CONSULTATION NOTE  Date of Service: 02/05/2019  Patient Care Team: [name], MD as PCP - General (Family Medicine)  CHIEF COMPLAINTS/PURPOSE OF CONSULTATION:  Newly diagnosed Metastatic Poorly differentiated lung adenocarcinoma  HISTORY OF PRESENTING ILLNESS:  [name] is a 53 year old male with no significant medical history.  He presented to the hospital with progressive right arm and chest pain with new right-sided neck swelling.  The patient states that he was diagnosed with carpal tunnel syndrome received an injection in August.  However, he continued to have progressive pain up and down his right arm that would also radiate to his right chest.  He took ibuprofen with no improvement.  1 week prior to admission, he developed new right sided neck swelling.  He presented to the emergency room for further evaluation.  Work-up in the emergency room was significant for a CT angiogram of the neck and chest which revealed a bulky soft tissue mass in the right neck continuing into the upper chest most compatible with extensive metastatic lymphadenopathy.  This mass measures up to 34 mm and encases multiple vessels including the right upper lobe pulmonary vessels, superior vena cava, and brachiocephalic vessel.  He also had a large poorly defined malignant appearing infiltrative mass measuring 6.2 x 5.6 x 7.9 cm within the right anterior and middle mediastinum with suspected invasion of the distal left brachiocephalic vein and probable tumor occlusion of the right subclavian and jugular veins.  There is also suspected tumor invasion of the upper SVC with string-like narrowing of the SVC but with patency of the SVC just above the right atrium.  The tumor abuts the aorta and great vessels and also results in significant narrowing of the right upper lobe pulmonary arterial vessels.  He also had a CT of the abdomen pelvis which showed a 15 mm heterogeneously enhancing lesion in  the upper pole of the right kidney concerning for renal cell carcinoma, no lymphadenopathy in the abdomen or pelvis, focal hyperenhancement of the anterior left liver most likely related to aberrant venous anatomy/flow, sclerotic lesions in the right acetabulum and left iliac bone are indeterminate.  These are likely bone islands but attention on follow-up is recommended a metastatic disease cannot be excluded.  MRI of the brain with and without contrast did not show any evidence of intracranial metastatic disease. The patient underwent a CT-guided biopsy in interventional radiology on 01/22/2019.  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   When seen today, patient reports ongoing pain and swelling to his right arm and right side of his neck.  Reports ongoing pain to his right chest.  He reports intermittent facial swelling which is worse in the morning and improves throughout the day.  He reports anorexia and a weight loss of 20 pounds over the past few weeks. He has had dizziness for the past few months.  Denies headaches.  He also noted that on the day of admission his peripheral vision was poor.  His vision is now improved.  He denies food getting stuck or difficulty swallowing but does feel as though his throat is "tight."  He reports a nonproductive cough.  Denies fevers and chills.  He is not really having any shortness of breath.  Denies abdominal pain, nausea, vomiting, constipation, diarrhea.  He has not noticed any epistaxis, hemoptysis, hematemesis, hematuria, melena, hematochezia.  The patient is single.  He has 1 daughter who lives in [place],  [place].  Reports occasional alcohol use and currently smokes 3 cigars a day since 1997.  He is currently living in a rooming house with other roommates who all have their own room.  They share a common bathroom and kitchen.  Medical oncology was asked see the patient  to make recommendations regarding his newly diagnosed poorly differentiated carcinoma.  History reviewed. No pertinent past medical history.  INTERVAL HISTORY:   David Berry. is a wonderful 53 y.o. male who is here for evaluation and management of newly diagnosed metastatic poorly differentiated lung adenocarcinoma. The patient was last seen by [place] on 01/27/2019. The pt reports that he is doing well overall.  The pt reports that radiation is going well. Since the pt has quit smoking cigars he has noticed that he has been able to breathe better. He doesn't feel that he needs a nicotine patch. He states that he has been having issues swallowing and feels like he choking on his saliva at night while lying down. Pt does not have issues swallowing food and is careful to chew his food thoroughly. He reports a sharp pain on the right side of his chest when he breathes. The pain appears to be near the site of his recent biopsy. Pt is experiencing constipation and blood in his stools, which he believes is due to hemorrhoids. He has observed blood droplets but not gushing blood. His hemorrhoids are painful to sit on but he has not been using any OTC topical to assist with the discomfort or taking a stool softener to help with his bowel movements. He hasn't had a bowel movement since Wednesday. Pt has no issues with his Lovenox injections. He has had a low appetite but is smoking marijuana which he feels is increasing his appetite. Pt has been eating about 50% of what he would normally. He has been continuing to work out using [employer] and trying to keep active.   Of note since the patient's last visit, pt has had PD-L1 Immunohistochemistry Analysis completed on 01/22/2019 with results revealing "Tumor Proportion Score (TPS) 50%"  Lab results (01/29/19) of CBC is as follows: all values are WNL except for RBC at 3.84, Hgb at 10.4, HCT at 33.0, PLTs at 490K.  On review of systems, pt reports  constipation, hemorrhoids, bloody stools, right-side chest pain, better breathing, low appetite, issues swallowing and denies arm/shoulder pain, abdominal pain and any other symptoms.    MEDICAL HISTORY:  No past medical history on file.  SURGICAL HISTORY: Past Surgical History:  Procedure Laterality Date   APPENDECTOMY     teenager   INGUINAL HERNIA REPAIR Right 2016, 2017   [facility], 2018 [facility] mesh    SOCIAL HISTORY: Social History   Socioeconomic History   Marital status: Single    Spouse name: Not on file   Number of children: 1   Years of education: GED   Highest education level: Not on file  Occupational History    Comment: [employer]  Social Needs   Financial resource strain: Not on file   Food insecurity    Worry: Not on file    Inability: Not on file   Transportation needs    Medical: Not on file    Non-medical: Not on file  Tobacco Use   Smoking status: Current Every Day Smoker    Types: Cigars   Smokeless tobacco: Never Used   Tobacco comment: smokes 3 black and mild cigars per day  Substance  and Sexual Activity   Alcohol use: Yes   Drug use: No   Sexual activity: Yes  Lifestyle   Physical activity    Days per week: Not on file    Minutes per session: Not on file   Stress: Not on file  Relationships   Social connections    Talks on phone: Not on file    Gets together: Not on file    Attends religious service: Not on file    Active member of club or organization: Not on file    Attends meetings of clubs or organizations: Not on file    Relationship status: Not on file   Intimate partner violence    Fear of current or ex partner: Not on file    Emotionally abused: Not on file    Physically abused: Not on file    Forced sexual activity: Not on file  Other Topics Concern   Not on file  Social History Narrative   Lives alone   Caffeine- coffee, 20 oz daily, tea occas    FAMILY HISTORY: Family  History  Problem Relation Age of Onset   Prostate cancer Father     ALLERGIES:  is allergic to pregabalin.  MEDICATIONS:  Current Outpatient Medications  Medication Sig Dispense Refill   enoxaparin (LOVENOX) 60 MG/0.6ML injection Inject 0.5 mLs (50 mg total) into the skin 2 (two) times daily. 36 mL 0   HYDROcodone-acetaminophen (NORCO/VICODIN) 5-325 MG tablet Take 1-2 tablets by mouth every 4 (four) hours as needed for moderate pain. 30 tablet 0   No current facility-administered medications for this visit.     REVIEW OF SYSTEMS:    10 Point review of Systems was done is negative except as noted above.  PHYSICAL EXAMINATION: ECOG PERFORMANCE STATUS: 1 - Symptomatic but completely ambulatory  . Vitals:   02/05/19 1449  BP: 92/67  Pulse: 91  Resp: 18  Temp: 97.6 F (36.4 C)  SpO2: 100%   Filed Weights   02/05/19 1449  Weight: 127 lb 6.4 oz (57.8 kg)   .Body mass index is 17.77 kg/m.  GENERAL:alert, in no acute distress and comfortable SKIN: no acute rashes, no significant lesions EYES: conjunctiva are pink and non-injected, sclera anicteric OROPHARYNX: MMM, no exudates, no oropharyngeal erythema or ulceration NECK: supple, no JVD LYMPH:  no palpable lymphadenopathy in the cervical, axillary or inguinal regions LUNGS: clear to auscultation b/l with normal respiratory effort HEART: regular rate & rhythm ABDOMEN:  normoactive bowel sounds , non tender, not distended. Extremity: no pedal edema PSYCH: alert & oriented x 3 with fluent speech NEURO: no focal motor/sensory deficits  LABORATORY DATA:  I have reviewed the data as listed  . CBC Latest Ref Rng & Units 01/29/2019 01/26/2019 01/25/2019  WBC 4.0 - 10.5 K/uL 6.9 11.3(H) 7.0  Hemoglobin 13.0 - 17.0 g/dL 10.4(L) 11.7(L) 10.8(L)  Hematocrit 39.0 - 52.0 % 33.0(L) 37.4(L) 34.1(L)  Platelets 150 - 400 K/uL 490(H) 520(H) 474(H)    . CMP Latest Ref Rng & Units 01/27/2019 01/20/2019  Glucose 70 - 99 mg/dL 106(H)  87  BUN 6 - 20 mg/dL 9 8  Creatinine 0.61 - 1.24 mg/dL 0.67 0.71  Sodium 135 - 145 mmol/L 135 137  Potassium 3.5 - 5.1 mmol/L 3.8 4.4  Chloride 98 - 111 mmol/L 103 103  CO2 22 - 32 mmol/L 23 21(L)  Calcium 8.9 - 10.3 mg/dL 9.6 10.0   01/22/2019 PD-L1 Immunohistochemistry Analysis    01/22/2019 Soft Tissue Needle Core Biopsy  Surgical Pathology     RADIOGRAPHIC STUDIES: I have personally reviewed the radiological images as listed and agreed with the findings in the report. Ct Angio Neck W And/or Wo Contrast  Result Date: 01/20/2019 CLINICAL DATA:  53 year old male with unexplained neck swelling for 3 months. Evidence of right IJ occlusion on thyroid ultrasound earlier today. EXAM: CT ANGIOGRAPHY NECK TECHNIQUE: Multidetector CT imaging of the neck was performed using the standard protocol during bolus administration of intravenous contrast. Multiplanar CT image reconstructions and MIPs were obtained to evaluate the vascular anatomy. Carotid stenosis measurements (when applicable) are obtained utilizing NASCET criteria, using the distal internal carotid diameter as the denominator. CONTRAST:  129m OMNIPAQUE IOHEXOL 350 MG/ML SOLN COMPARISON:  CTA chest reported separately. Thyroid ultrasound earlier today. FINDINGS: Skeleton: No acute or suspicious osseous lesion from the skull base to the upper chest. Mucous retention cyst in the left maxillary sinus. Mild right mastoid effusion. Upper chest: Mediastinal mass/lymphadenopathy, reported separately today. Other neck: Bulky but indistinct soft tissue masses tracking cephalad in the right neck from the thoracic inlet. Involvement of the right level 4, right level 5, right level 3 and right level 2 nodal stations. Superimposed subcutaneous edema in the region. Estimated individual lymph nodes/soft tissue mass size up to 34 millimeters long axis, 23 millimeters short axis. The level 1 nodal station seems to be spared. The left neck appears relatively  spared. Mild leftward mass effect on the trachea and pharynx. The thyroid and pharyngeal soft tissue contours remain within normal limits. The glottis is closed. The parapharyngeal spaces are normal. And superior retropharyngeal space there is edema/effusion in the lower retropharyngeal space. The sublingual, submandibular and parotid spaces appear spared. Grossly negative visible brain parenchyma. Visualized orbit soft tissues are within normal limits. Aortic arch: 3 vessel arch configuration.  No arch atherosclerosis. Right carotid system: Mass effect on the brachiocephalic artery and to a lesser extent the right carotid space from the chest and neck soft tissue masses. No associated arterial stenosis. Negative right carotid bifurcation and cervical right ICA. Visible right ICA siphon is normal. Left carotid system: Negative aside from mild left ICA tortuosity in the neck. Negative visible left ICA siphon. Vertebral arteries: Mild mass effect on the proximal right subclavian artery due to the chest and neck soft tissue masses. No right subclavian stenosis. Normal right vertebral artery origin. Patent and normal right vertebral artery to the vertebrobasilar junction. Negative visible basilar artery. Negative proximal left subclavian artery and left vertebral artery origin. Mildly non dominant left vertebral artery is patent and negative to the vertebrobasilar junction. Other findings: Delayed venous phase images were also obtained. The right sigmoid sinus and right IJ bulb are patent at the skull base, but the right jugular vein is occluded beginning at the level of the right retromandibular vein, and continuing to the right subclavian/innominate vein confluence. The right innominate vein is occluded in the upper chest on series 19, image 113. The left innominate and left internal jugular vein remain patent. The left sigmoid and transverse sinus are patent. Review of the MIP images confirms the above findings  IMPRESSION: 1. Bulky soft tissue masses in the right neck and continuing into the upper chest most compatible with extensive metastatic lymphadenopathy. Individual nodal masses up to 34 mm. 2. Associated thrombosis of the Right IJ, Right Innominate Vein, and likely also the SVC - See Chest CTA reported separately. 3. Mild retropharyngeal effusion likely related to #2. 4. No arterial abnormality in the neck. Electronically Signed  By: Genevie Ann M.D.   On: 01/20/2019 17:18   Mr Jeri Cos VZ Contrast  Result Date: 01/23/2019 CLINICAL DATA:  Renal cell cancer, staging. EXAM: MRI HEAD WITHOUT AND WITH CONTRAST TECHNIQUE: Multiplanar, multiecho pulse sequences of the brain and surrounding structures were obtained without and with intravenous contrast. CONTRAST:  45m GADAVIST GADOBUTROL 1 MMOL/ML IV SOLN COMPARISON:  CT angiogram neck 01/20/2019 FINDINGS: Brain: Multiple sequences are significantly motion degraded, limiting evaluation. This includes mild motion degradation of postcontrast imaging. No abnormal intracranial enhancement is identified to suggest intracranial metastatic disease. No evidence of acute infarct. No evidence of intracranial mass. No midline shift or extra-axial fluid collection. No chronic intracranial hemorrhage. A few scattered punctate foci of T2/FLAIR hyperintensity within the cerebral white matter are nonspecific, but may reflect minimal chronic small vessel ischemic disease. Cerebral volume is normal for age. Vascular: Flow voids maintained within the proximal large arterial vessels. Skull and upper cervical spine: Normal marrow signal. Sinuses/Orbits: Imaged globes and orbits demonstrate no acute abnormality. Complete T2 hyperintense opacification of the left maxillary sinus. Mild scattered mucosal thickening within the remainder of the paranasal sinuses. Right mastoid effusion. IMPRESSION: Motion degraded examination. No evidence of intracranial metastatic disease. A few scattered punctate  signal changes within the cerebral white matter are nonspecific, but may reflect minimal chronic small vessel ischemic disease. Complete T2 hyperintense opacification of the left maxillary sinus, nonspecific but possibly reflecting fluid or a large mucous retention cyst. Right mastoid effusion. Electronically Signed   By: KKellie Simmering  On: 01/23/2019 10:59   Ct Abdomen Pelvis W Contrast  Result Date: 01/21/2019 CLINICAL DATA:  New diagnosis of mediastinal mass. Evaluate for lymphoma or metastatic disease. EXAM: CT ABDOMEN AND PELVIS WITH CONTRAST TECHNIQUE: Multidetector CT imaging of the abdomen and pelvis was performed using the standard protocol following bolus administration of intravenous contrast. CONTRAST:  1064mOMNIPAQUE IOHEXOL 300 MG/ML  SOLN COMPARISON:  None. FINDINGS: Lower chest: The unremarkable. Hepatobiliary: Hyperenhancing focus in the anterior left liver likely related to anomalous venous anatomy/flow. Liver otherwise unremarkable. Probable layering sludge in the gallbladder lumen No intrahepatic or extrahepatic biliary dilation. Pancreas: No focal mass lesion. No dilatation of the main duct. No intraparenchymal cyst. No peripancreatic edema. Spleen: No splenomegaly. No focal mass lesion. Adrenals/Urinary Tract: No adrenal nodule or mass. 2 mm nonobstructing stone noted lower pole right kidney. 15 mm subcapsular lesion in the upper pole right kidney has heterogeneous enhancement. Left kidney unremarkable. Ureters not well visualized due to lack of retroperitoneal fat but no hydroureter evident. The urinary bladder appears normal for the degree of distention. Stomach/Bowel: Stomach is unremarkable. No gastric wall thickening. No evidence of outlet obstruction. No small bowel or colonic dilatation. Vascular/Lymphatic: No abdominal aortic aneurysm. No discernible lymphadenopathy in the abdomen or pelvis. Reproductive: The prostate gland and seminal vesicles are unremarkable. Other: No  intraperitoneal free fluid. Musculoskeletal: 11 mm sclerotic focus noted posterior left iliac bone. 10 mm sclerotic focus noted in the posterior right acetabulum. Increased attenuation in lower lumbar vertebral bodies likely enhancement related to opacified venous anatomy. IMPRESSION: 1. 15 mm heterogeneously enhancing lesion in the upper pole right kidney. Renal cell carcinoma a concern. MRI without and with contrast recommended to further evaluate. 2. No lymphadenopathy in the abdomen or pelvis. 3. Focal hyperenhancement in the anterior left liver most likely related to aberrant venous anatomy/flow. 4. Sclerotic lesions in the right acetabulum and left iliac bone are indeterminate. Likely bone islands but attention on follow-up recommended as  metastatic cannot be excluded. Electronically Signed   By: Misty Stanley M.D.   On: 01/21/2019 19:41   Ct Biopsy  Result Date: 01/22/2019 CLINICAL DATA:  Mediastinal mass EXAM: CT GUIDED CORE BIOPSY OF MEDIASTINAL MASS ANESTHESIA/SEDATION: Intravenous Fentanyl 10mg and Versed 186mwere administered as conscious sedation during continuous monitoring of the patient?s level of consciousness and physiological / cardiorespiratory status by the radiology RN, with a total moderate sedation time of 16 minutes. PROCEDURE: The procedure risks, benefits, and alternatives were explained to the patient. Questions regarding the procedure were encouraged and answered. The patient understands and consents to the procedure. Select axial scans through the thorax were obtained. The anterior mediastinal mass was localized and an appropriate skin entry site was identified and marked. The operative field was prepped with chlorhexidinein a sterile fashion, and a sterile drape was applied covering the operative field. A sterile gown and sterile gloves were used for the procedure. Local anesthesia was provided with 1% Lidocaine. Under CT fluoroscopic guidance, a 17 gauge trocar needle was  advanced to the margin of the lesion. Once needle tip position was confirmed, coaxial 18-gauge core biopsy samples were obtained, submitted in saline to surgical pathology. The guide needle was removed. Postprocedure scan shows no hematoma, pneumothorax, or other apparent complication. COMPLICATIONS: None immediate FINDINGS: Anterior mediastinal mass was localized and representative core biopsy samples obtained as above. IMPRESSION: Technically successful CT-guided core biopsy of anterior mediastinal mass. Electronically Signed   By: D Lucrezia Europe.D.   On: 01/22/2019 16:42   Ct Angio Chest Aorta W/cm &/or Wo/cm  Addendum Date: 01/20/2019   ADDENDUM REPORT: 01/20/2019 20:45 ADDENDUM: Clarification to the report. Thrombosis of the right jugular and brachiocephalic veins and probable subclavian vein. Confluence of these vessels is surrounded/occluded by the large mass. There is suspected tumor occlusion/thrombosis of the distal left brachiocephalic vein and upper SVC. Electronically Signed   By: KiDonavan Foil.D.   On: 01/20/2019 20:45   Result Date: 01/20/2019 CLINICAL DATA:  Jugular vein thrombosis EXAM: CT ANGIOGRAPHY CHEST WITH CONTRAST TECHNIQUE: Multidetector CT imaging of the chest was performed using the standard protocol during bolus administration of intravenous contrast. Multiplanar CT image reconstructions and MIPs were obtained to evaluate the vascular anatomy. CONTRAST:  10072mMNIPAQUE IOHEXOL 350 MG/ML SOLN COMPARISON:  Thyroid ultrasound 01/20/2019 FINDINGS: Cardiovascular: Satisfactory opacification of the pulmonary arteries to the segmental level. No evidence of pulmonary embolism. Nonaneurysmal aorta. No dissection. Normal heart size. No significant pericardial effusion. Occluded right jugular and probable subclavian veins. Narrowed appearance of the left brachiocephalic vein as it approaches the venous confluence. Narrowed and likely subtotal occlusion of the upper SVC. Patency of the SVC as  it enters the right atrium. Severe narrowing of right upper lobe pulmonary arterial vessels. Numerous collateral vessels within the mediastinum. Mediastinum/Nodes: Midline trachea.  No discrete thyroid nodule. 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 Mass measures approximately 6.2 cm AP by 5.6 cm transverse by 7.9 cm craniocaudad. It is contiguous with additional mass or nodes in the right hilar region. The mass encases/narrows the right upper lobe pulmonary vessels as well as the superior vena cava. Probable tumor invasion of the distal left brachiocephalic vessel and presumed tumor occlusion of the right brachiocephalic and jugular vessels. Partially visualized enlarged right supraclavicular nodes. Generalized edema within the soft tissues of the thoracic inlet and base of right neck. Fluid-filled esophagus with mild distal circumferential esophageal thickening. Lungs/Pleura: Emphysema. Presumed left greater than right apical scarring  with left apical calcification. Posterior right upper lobe subpleural nodular focus of airspace disease measuring 13 mm, series 6, image number 55. Irregular focus of airspace disease, also within the right upper lobe abutting the fissure, series 6, image number 79. No pleural effusion or pneumothorax. Upper Abdomen: No acute abnormality. Musculoskeletal: No chest wall abnormality. No acute or significant osseous findings. Review of the MIP images confirms the above findings. IMPRESSION: 1. No acute pulmonary embolus or aortic dissection. 2. Large poorly defined malignant-appearing infiltrative mass measuring approximately 6.2 x 5.6 x 7.9 cm within the right anterior and middle mediastinum. Suspected invasion of distal left brachiocephalic vein and probable tumor occlusion of the right subclavian and jugular veins. Suspected tumor invasion of the upper SVC with string like narrowing of the SVC, but with patency of the SVC just  above the right atrium. Tumor abuts the aorta and great vessels and also results in significant narrowing of right upper lobe pulmonary arterial vessels. Incompletely visualized right supraclavicular adenopathy. Right hilar nodes and or mass. 3. At least 2 foci of somewhat nodular appearing airspace disease in the right upper lobe, either representing pneumonia or foci of neoplasm. 4. Emphysema Emphysema (ICD10-J43.9). Electronically Signed: By: Donavan Foil M.D. On: 01/20/2019 17:41   US Thyroid  Result Date: 01/20/2019 CLINICAL DATA:  Goiter, neck swelling x3 months EXAM: THYROID ULTRASOUND TECHNIQUE: Ultrasound examination of the thyroid gland and adjacent soft tissues was performed. COMPARISON:  None. FINDINGS: Parenchymal Echotexture: Mildly heterogenous Isthmus: 0.3 cm thickness Right lobe: 6 x 2.1 x 2.3 cm Left lobe: 6 x 2 x 1.8 cm _________________________________________________________ Estimated total number of nodules >/= 1 cm: 0 Number of spongiform nodules >/=  2 cm not described below (TR1): 0 Number of mixed cystic and solid nodules >/= 1.5 cm not described below (TR2): 0 _________________________________________________________ No discrete nodules are seen within the thyroid gland. Suspected echogenic thrombus occluding the right IJ vein. IMPRESSION: 1. Thyromegaly without nodule. 2. 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 The above is in keeping with the ACR TI-RADS recommendations - J Am Coll Radiol 2017;14:587-595. Electronically Signed   By: Lucrezia Europe M.D.   On: 01/20/2019 13:27    ASSESSMENT & PLAN:  This is a 53 year old malewith  1. 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 Acute DVT- Right IJ, Right Innominate Vein, and likely also the SVC- related to malignancy+  tobacco-  on lovenox  4. Symptomatic hemorrhoids  5. S PLAN:  -Discussed pt labwork today, 01/29/19; all values are WNL except for RBC at 3.84, Hgb at 10.4, HCT at 33.0, PLTs at 490K. -Discussed 01/22/2019 PD-L1 Immunohistochemistry Analysis which revealed "Tumor Proportion Score (TPS) 50%" -Continue palliative RT for impending SVC syndrome and abuttment/ptoential invasion of large mediastinal arteries. -discussed concurrent chemotherapy of carboplatin = Taxol  to control risk of systemic progression outside of RT field and improve on response of palliative RT given potentially resistant poorly differentiated tumor. -Recommended sitz bath, OTC topical and stool softeners to help with his hemorrhoids -Continue Lovenox shots for at least 1 -2 months prior to transitioning to Eliquis -Will hold off on port for as long as possible  -Advised that chemotherapy will not be curative  -Will begin Carboplatin & Taxol weekly with labs ASAP -Will set up chemotherapy counseiling   -Will refer to our nutritionist, Pamala Hurry  [place] -Rx stool softner -Will see back with C2 of chemotherapy  -will f/u on pending foundation One result to determine if any targetable gene mutations noted.  3. Normocytic anemia -Likely due to underlying malignancy -Will check ferritin, iron studies, vitamin B12 level, and RBC folate in the a.m.  4 Thrombocytosis -- likely due to paraneoplastic effect of tumor and reactive due to inflammation and tissue inflammation from RT.  5. Nicotine dependence -He was counseled about tobacco cessation.   FOLLOW UP: -schedule to start weekly Carboplatin/Taxol ASAP -chemotherapy counseling for carboplatin/taxol concurrent with RT -labs with each treatment -MD visit with C2 and C3 of treatment -dietician referral to Ernestene Kiel in 1 week   All of the patients questions were answered with apparent satisfaction. The patient knows to call the clinic with any problems, questions or  concerns.  I spent 65mns counseling the patient face to face. The total time spent in the appointment was 40 minsand more than 50% was on counseling and direct patient cares.    GSullivan LoneMD MPelahatchieAAHIVMS SPleasantdale Ambulatory Care LLCCPerry Community HospitalHematology/Oncology Physician [facility] (Office):       [phone](Work cell):  [phone](Fax):           [phone] 02/05/2019 2:48 PM  I, JYevette Edwards am acting as a scribe for Dr. GSullivan Lone   .I have reviewed the above documentation for accuracy and completeness, and I agree with the above. .Brunetta GeneraMD

## 2019-02-06 ENCOUNTER — Ambulatory Visit
Admission: RE | Admit: 2019-02-06 | Discharge: 2019-02-06 | Disposition: A | Discharge status: Home / Self Care | Payer: Medicaid Other | Source: Ambulatory Visit | Attending: Radiation Oncology | Admitting: Radiation Oncology

## 2019-02-06 ENCOUNTER — Ambulatory Visit: Payer: Self-pay | Admitting: Family Medicine

## 2019-02-06 ENCOUNTER — Other Ambulatory Visit: Payer: Self-pay

## 2019-02-06 ENCOUNTER — Ambulatory Visit: Payer: Medicaid Other

## 2019-02-06 DIAGNOSIS — C3491 Malignant neoplasm of unspecified part of right bronchus or lung: Secondary | ICD-10-CM | POA: Diagnosis not present

## 2019-02-06 DIAGNOSIS — C3411 Malignant neoplasm of upper lobe, right bronchus or lung: Secondary | ICD-10-CM | POA: Diagnosis not present

## 2019-02-06 DIAGNOSIS — Z510 Encounter for antineoplastic radiation therapy: Secondary | ICD-10-CM | POA: Diagnosis not present

## 2019-02-06 DIAGNOSIS — I871 Compression of vein: Secondary | ICD-10-CM | POA: Diagnosis not present

## 2019-02-07 ENCOUNTER — Other Ambulatory Visit: Payer: Self-pay

## 2019-02-07 ENCOUNTER — Other Ambulatory Visit: Payer: Self-pay | Admitting: Hematology

## 2019-02-07 ENCOUNTER — Ambulatory Visit: Payer: Medicaid Other

## 2019-02-07 ENCOUNTER — Inpatient Hospital Stay: Payer: Medicaid Other

## 2019-02-07 ENCOUNTER — Ambulatory Visit
Admission: RE | Admit: 2019-02-07 | Discharge: 2019-02-07 | Disposition: A | Discharge status: Home / Self Care | Payer: Medicaid Other | Source: Ambulatory Visit | Attending: Radiation Oncology | Admitting: Radiation Oncology

## 2019-02-07 ENCOUNTER — Other Ambulatory Visit: Payer: Self-pay | Admitting: Urology

## 2019-02-07 DIAGNOSIS — C3411 Malignant neoplasm of upper lobe, right bronchus or lung: Secondary | ICD-10-CM | POA: Diagnosis not present

## 2019-02-07 DIAGNOSIS — Z510 Encounter for antineoplastic radiation therapy: Secondary | ICD-10-CM | POA: Diagnosis not present

## 2019-02-07 DIAGNOSIS — C3491 Malignant neoplasm of unspecified part of right bronchus or lung: Secondary | ICD-10-CM

## 2019-02-07 DIAGNOSIS — Z7189 Other specified counseling: Secondary | ICD-10-CM

## 2019-02-07 DIAGNOSIS — I871 Compression of vein: Secondary | ICD-10-CM | POA: Diagnosis not present

## 2019-02-07 MED ORDER — PROCHLORPERAZINE MALEATE 10 MG PO TABS: 10.0000 mg | ORAL_TABLET | Freq: Four times a day (QID) | ORAL | 1 refills | Status: DC | PRN

## 2019-02-07 MED ORDER — LORAZEPAM 0.5 MG PO TABS: 0.5000 mg | ORAL_TABLET | Freq: Four times a day (QID) | ORAL | 0 refills | Status: DC | PRN

## 2019-02-07 MED ORDER — DEXAMETHASONE 4 MG PO TABS: 8.0000 mg | ORAL_TABLET | Freq: Every day | ORAL | 1 refills | Status: DC

## 2019-02-07 MED ORDER — ONDANSETRON HCL 8 MG PO TABS: 8.0000 mg | ORAL_TABLET | Freq: Two times a day (BID) | ORAL | 1 refills | Status: DC | PRN

## 2019-02-07 MED ORDER — SUCRALFATE 1 G PO TABS: 1.0000 g | ORAL_TABLET | Freq: Three times a day (TID) | ORAL | 1 refills | Status: DC

## 2019-02-08 ENCOUNTER — Other Ambulatory Visit: Payer: Self-pay

## 2019-02-08 ENCOUNTER — Encounter (HOSPITAL_COMMUNITY): Payer: Self-pay | Admitting: Hematology

## 2019-02-08 ENCOUNTER — Ambulatory Visit
Admission: RE | Admit: 2019-02-08 | Discharge: 2019-02-08 | Disposition: A | Discharge status: Home / Self Care | Payer: Medicaid Other | Source: Ambulatory Visit | Attending: Radiation Oncology | Admitting: Radiation Oncology

## 2019-02-08 ENCOUNTER — Ambulatory Visit: Payer: Medicaid Other

## 2019-02-08 DIAGNOSIS — C3491 Malignant neoplasm of unspecified part of right bronchus or lung: Secondary | ICD-10-CM | POA: Insufficient documentation

## 2019-02-08 DIAGNOSIS — Z510 Encounter for antineoplastic radiation therapy: Secondary | ICD-10-CM | POA: Diagnosis not present

## 2019-02-08 DIAGNOSIS — I871 Compression of vein: Secondary | ICD-10-CM | POA: Insufficient documentation

## 2019-02-08 DIAGNOSIS — C3411 Malignant neoplasm of upper lobe, right bronchus or lung: Secondary | ICD-10-CM | POA: Diagnosis not present

## 2019-02-09 ENCOUNTER — Other Ambulatory Visit: Payer: Self-pay

## 2019-02-09 ENCOUNTER — Ambulatory Visit
Admission: RE | Admit: 2019-02-09 | Discharge: 2019-02-09 | Disposition: A | Discharge status: Home / Self Care | Payer: Medicaid Other | Source: Ambulatory Visit | Attending: Radiation Oncology | Admitting: Radiation Oncology

## 2019-02-09 DIAGNOSIS — C3411 Malignant neoplasm of upper lobe, right bronchus or lung: Secondary | ICD-10-CM | POA: Diagnosis not present

## 2019-02-09 DIAGNOSIS — C3491 Malignant neoplasm of unspecified part of right bronchus or lung: Secondary | ICD-10-CM | POA: Diagnosis not present

## 2019-02-09 DIAGNOSIS — Z510 Encounter for antineoplastic radiation therapy: Secondary | ICD-10-CM | POA: Diagnosis not present

## 2019-02-09 DIAGNOSIS — I871 Compression of vein: Secondary | ICD-10-CM | POA: Diagnosis not present

## 2019-02-12 ENCOUNTER — Inpatient Hospital Stay: Payer: Medicaid Other | Admitting: Nutrition

## 2019-02-12 ENCOUNTER — Inpatient Hospital Stay: Payer: Medicaid Other

## 2019-02-12 ENCOUNTER — Encounter: Payer: Self-pay | Admitting: Nutrition

## 2019-02-12 ENCOUNTER — Ambulatory Visit
Admission: RE | Admit: 2019-02-12 | Discharge: 2019-02-12 | Disposition: A | Discharge status: Home / Self Care | Payer: Medicaid Other | Source: Ambulatory Visit | Attending: Radiation Oncology | Admitting: Radiation Oncology

## 2019-02-12 ENCOUNTER — Other Ambulatory Visit: Payer: Self-pay

## 2019-02-12 ENCOUNTER — Inpatient Hospital Stay: Payer: Medicaid Other | Attending: Hematology

## 2019-02-12 VITALS — BP 99/72 | HR 85 | Temp 98.7°F | Resp 17

## 2019-02-12 DIAGNOSIS — C3411 Malignant neoplasm of upper lobe, right bronchus or lung: Secondary | ICD-10-CM | POA: Diagnosis not present

## 2019-02-12 DIAGNOSIS — R7989 Other specified abnormal findings of blood chemistry: Secondary | ICD-10-CM | POA: Insufficient documentation

## 2019-02-12 DIAGNOSIS — J439 Emphysema, unspecified: Secondary | ICD-10-CM | POA: Insufficient documentation

## 2019-02-12 DIAGNOSIS — F17290 Nicotine dependence, other tobacco product, uncomplicated: Secondary | ICD-10-CM | POA: Diagnosis not present

## 2019-02-12 DIAGNOSIS — Z7901 Long term (current) use of anticoagulants: Secondary | ICD-10-CM | POA: Diagnosis not present

## 2019-02-12 DIAGNOSIS — Z79899 Other long term (current) drug therapy: Secondary | ICD-10-CM | POA: Diagnosis not present

## 2019-02-12 DIAGNOSIS — C3491 Malignant neoplasm of unspecified part of right bronchus or lung: Secondary | ICD-10-CM

## 2019-02-12 DIAGNOSIS — E041 Nontoxic single thyroid nodule: Secondary | ICD-10-CM | POA: Insufficient documentation

## 2019-02-12 DIAGNOSIS — I871 Compression of vein: Secondary | ICD-10-CM | POA: Diagnosis not present

## 2019-02-12 DIAGNOSIS — D649 Anemia, unspecified: Secondary | ICD-10-CM | POA: Diagnosis not present

## 2019-02-12 DIAGNOSIS — R221 Localized swelling, mass and lump, neck: Secondary | ICD-10-CM

## 2019-02-12 DIAGNOSIS — Z5111 Encounter for antineoplastic chemotherapy: Secondary | ICD-10-CM | POA: Diagnosis present

## 2019-02-12 DIAGNOSIS — Z510 Encounter for antineoplastic radiation therapy: Secondary | ICD-10-CM | POA: Diagnosis not present

## 2019-02-12 DIAGNOSIS — Z7189 Other specified counseling: Secondary | ICD-10-CM

## 2019-02-12 DIAGNOSIS — N2889 Other specified disorders of kidney and ureter: Secondary | ICD-10-CM | POA: Insufficient documentation

## 2019-02-12 LAB — CMP (CANCER CENTER ONLY)
ALT: 22 U/L (ref 0–44)
AST: 17 U/L (ref 15–41)
Albumin: 3.1 g/dL — ABNORMAL LOW (ref 3.5–5.0)
Alkaline Phosphatase: 140 U/L — ABNORMAL HIGH (ref 38–126)
Anion gap: 11 (ref 5–15)
BUN: 10 mg/dL (ref 6–20)
CO2: 25 mmol/L (ref 22–32)
Calcium: 9.4 mg/dL (ref 8.9–10.3)
Chloride: 103 mmol/L (ref 98–111)
Creatinine: 0.84 mg/dL (ref 0.61–1.24)
GFR, Est AFR Am: >60 mL/min (ref >60)
GFR, Estimated: >60 mL/min (ref >60)
Glucose, Bld: 117 mg/dL — ABNORMAL HIGH (ref 70–99)
Potassium: 3.8 mmol/L (ref 3.5–5.1)
Sodium: 139 mmol/L (ref 135–145)
Total Bilirubin: <0.2 mg/dL — ABNORMAL LOW (ref 0.3–1.2)
Total Protein: 7.1 g/dL (ref 6.5–8.1)

## 2019-02-12 LAB — CBC WITH DIFFERENTIAL/PLATELET
Abs Immature Granulocytes: 0.01 10*3/uL (ref 0.00–0.07)
Basophils Absolute: 0 10*3/uL (ref 0.0–0.1)
Basophils Relative: 1 %
Eosinophils Absolute: 0 10*3/uL (ref 0.0–0.5)
Eosinophils Relative: 1 %
HCT: 37 % — ABNORMAL LOW (ref 39.0–52.0)
Hemoglobin: 11.9 g/dL — ABNORMAL LOW (ref 13.0–17.0)
Immature Granulocytes: 0 %
Lymphocytes Relative: 10 %
Lymphs Abs: 0.3 10*3/uL — ABNORMAL LOW (ref 0.7–4.0)
MCH: 27.1 pg (ref 26.0–34.0)
MCHC: 32.2 g/dL (ref 30.0–36.0)
MCV: 84.3 fL (ref 80.0–100.0)
Monocytes Absolute: 0.6 10*3/uL (ref 0.1–1.0)
Monocytes Relative: 20 %
Neutro Abs: 2.1 10*3/uL (ref 1.7–7.7)
Neutrophils Relative %: 68 %
Platelets: 381 10*3/uL (ref 150–400)
RBC: 4.39 MIL/uL (ref 4.22–5.81)
RDW: 15.5 % (ref 11.5–15.5)
WBC: 3.1 10*3/uL — ABNORMAL LOW (ref 4.0–10.5)
nRBC: 0 % (ref 0.0–0.2)

## 2019-02-12 LAB — MAGNESIUM: Magnesium: 2 mg/dL (ref 1.7–2.4)

## 2019-02-12 MED ORDER — ALUM & MAG HYDROXIDE-SIMETH 200-200-20 MG/5ML PO SUSP
ORAL | Status: AC
  Filled 2019-02-12: qty 30

## 2019-02-12 MED ORDER — ALUM & MAG HYDROXIDE-SIMETH 200-200-20 MG/5ML PO SUSP
30.0000 mL | Freq: Once | ORAL | Status: AC
  Administered 2019-02-12: 09:00:00 30 mL via ORAL

## 2019-02-12 MED ORDER — SODIUM CHLORIDE 0.9 % IV SOLN
Freq: Once | INTRAVENOUS | Status: AC
  Administered 2019-02-12: 08:00:00 via INTRAVENOUS
  Filled 2019-02-12: qty 250

## 2019-02-12 MED ORDER — SODIUM CHLORIDE 0.9 % IV SOLN
45.0000 mg/m2 | Freq: Once | INTRAVENOUS | Status: AC
  Administered 2019-02-12: 10:00:00 78 mg via INTRAVENOUS
  Filled 2019-02-12: qty 13

## 2019-02-12 MED ORDER — FAMOTIDINE IN NACL 20-0.9 MG/50ML-% IV SOLN
20.0000 mg | Freq: Once | INTRAVENOUS | Status: AC
  Administered 2019-02-12: 09:00:00 20 mg via INTRAVENOUS

## 2019-02-12 MED ORDER — LIDOCAINE VISCOUS HCL 2 % MT SOLN
15.0000 mL | Freq: Once | OROMUCOSAL | Status: AC
  Administered 2019-02-12: 15 mL via ORAL
  Filled 2019-02-12: qty 15

## 2019-02-12 MED ORDER — SODIUM CHLORIDE 0.9 % IV SOLN
20.0000 mg | Freq: Once | INTRAVENOUS | Status: AC
  Administered 2019-02-12: 09:00:00 20 mg via INTRAVENOUS
  Filled 2019-02-12: qty 20

## 2019-02-12 MED ORDER — PALONOSETRON HCL INJECTION 0.25 MG/5ML
INTRAVENOUS | Status: AC
  Filled 2019-02-12: qty 5

## 2019-02-12 MED ORDER — PALONOSETRON HCL INJECTION 0.25 MG/5ML
0.2500 mg | Freq: Once | INTRAVENOUS | Status: AC
  Administered 2019-02-12: 09:00:00 0.25 mg via INTRAVENOUS

## 2019-02-12 MED ORDER — FAMOTIDINE IN NACL 20-0.9 MG/50ML-% IV SOLN
INTRAVENOUS | Status: AC
  Filled 2019-02-12: qty 50

## 2019-02-12 MED ORDER — SODIUM CHLORIDE 0.9 % IV SOLN
218.2000 mg | Freq: Once | INTRAVENOUS | Status: AC
  Administered 2019-02-12: 12:00:00 220 mg via INTRAVENOUS
  Filled 2019-02-12: qty 22

## 2019-02-12 MED ORDER — DIPHENHYDRAMINE HCL 50 MG/ML IJ SOLN
50.0000 mg | Freq: Once | INTRAMUSCULAR | Status: AC
  Administered 2019-02-12: 09:00:00 50 mg via INTRAVENOUS

## 2019-02-12 MED ORDER — DIPHENHYDRAMINE HCL 50 MG/ML IJ SOLN
INTRAMUSCULAR | Status: AC
  Filled 2019-02-12: qty 1

## 2019-02-12 NOTE — Progress Notes (Signed)
53 year old male diagnosed with Lung cancer receiving concurrent chemoradiation treatment. He is a patient of Dr. Irene Limbo.  PMH is not pertinent.  Medications include decadron, colace, ativan, Zofran, Compazine, and Carafate.  Labs include Glucose 106.  Height: 5'11". Weight: 127.4 pounds Sept 28. 131.2 pounds March 2. UBW: 135-145 pounds per patient. BMI: 17.77.  Patient endorses weight loss and reports he has lost about 20 pounds. Patient is s/p swallowing assessment and prescribed a Dysphagia 3, thin liquid diet. He reports increased pain with swallowing.  Did not tolerate Ensure in the hospital. Rosezena Sensor desire to gain weight.  Nutrition Diagnosis: Unintended weight loss related to inadequate oral intake as evidenced by 3% weight loss since March.  Intervention: Educated patient to increase calories and protein in small, frequent meals. Recommended Boost Plus TID between meals. Provided samples and fact sheets. Encouraged soft moist foods. Questions answered. Contact information provided.  Monitoring, Evaluation, Goals: Consume increased calories and protein to promote weight gain.  Next Visit:Monday, Oct 12, during infusion.  **Disclaimer: This note was dictated with voice recognition software. Similar sounding words can inadvertently be transcribed and this note may contain transcription errors which may not have been corrected upon publication of note.**

## 2019-02-12 NOTE — Progress Notes (Signed)
Pt here for first chemo .  Pain with swallowing reported. Grimacing when he swallows. He has not been able to obtain his carafate this weekend. Message sent to xrt for [place] to give a dose today while he is in chemotherapy.

## 2019-02-12 NOTE — Patient Instructions (Signed)
[facility] [place] Discharge Instructions for Patients Receiving Chemotherapy  Today you received the following chemotherapy agents: Paclitaxel (Taxol) and Carboplatin (Paraplatin)  To help prevent nausea and vomiting after your treatment, we encourage you to take your nausea medication as directed.   If you develop nausea and vomiting that is not controlled by your nausea medication, call the clinic.   BELOW ARE SYMPTOMS THAT SHOULD BE REPORTED IMMEDIATELY:  *FEVER GREATER THAN 100.5 F  *CHILLS WITH OR WITHOUT FEVER  NAUSEA AND VOMITING THAT IS NOT CONTROLLED WITH YOUR NAUSEA MEDICATION  *UNUSUAL SHORTNESS OF BREATH  *UNUSUAL BRUISING OR BLEEDING  TENDERNESS IN MOUTH AND THROAT WITH OR WITHOUT PRESENCE OF ULCERS  *URINARY PROBLEMS  *BOWEL PROBLEMS  UNUSUAL RASH Items with * indicate a potential emergency and should be followed up as soon as possible.  Feel free to call the clinic should you have any questions or concerns. The clinic phone number is [phone].  Please show the [place] at check-in to the Emergency Department and triage nurse.  Paclitaxel injection What is this medicine? PACLITAXEL (PAK li TAX el) is a chemotherapy drug. It targets fast dividing cells, like cancer cells, and causes these cells to die. This medicine is used to treat ovarian cancer, breast cancer, lung cancer, Kaposi's sarcoma, and other cancers. This medicine may be used for other purposes; ask your health care provider or pharmacist if you have questions. COMMON BRAND NAME(S): Onxol, Taxol What should I tell my health care provider before I take this medicine? They need to know if you have any of these conditions:  history of irregular heartbeat  liver disease  low blood counts, like low white cell, platelet, or red cell counts  lung or breathing disease, like asthma  tingling of the fingers or toes, or other nerve disorder  an unusual or allergic reaction to  paclitaxel, alcohol, polyoxyethylated castor oil, other chemotherapy, other medicines, foods, dyes, or preservatives  pregnant or trying to get pregnant  breast-feeding How should I use this medicine? This drug is given as an infusion into a vein. It is administered in a hospital or clinic by a specially trained health care professional. Talk to your pediatrician regarding the use of this medicine in children. Special care may be needed. Overdosage: If you think you have taken too much of this medicine contact a poison control center or emergency room at once. NOTE: This medicine is only for you. Do not share this medicine with others. What if I miss a dose? It is important not to miss your dose. Call your doctor or health care professional if you are unable to keep an appointment. What may interact with this medicine? Do not take this medicine with any of the following medications:  disulfiram  metronidazole This medicine may also interact with the following medications:  antiviral medicines for hepatitis, HIV or AIDS  certain antibiotics like erythromycin and clarithromycin  certain medicines for fungal infections like ketoconazole and itraconazole  certain medicines for seizures like carbamazepine, phenobarbital, phenytoin  gemfibrozil  nefazodone  rifampin  St. John's wort This list may not describe all possible interactions. Give your health care provider a list of all the medicines, herbs, non-prescription drugs, or dietary supplements you use. Also tell them if you smoke, drink alcohol, or use illegal drugs. Some items may interact with your medicine. What should I watch for while using this medicine? Your condition will be monitored carefully while you are receiving this medicine. You will  need important blood work done while you are taking this medicine. This medicine can cause serious allergic reactions. To reduce your risk you will need to take other medicine(s)  before treatment with this medicine. If you experience allergic reactions like skin rash, itching or hives, swelling of the face, lips, or tongue, tell your doctor or health care professional right away. In some cases, you may be given additional medicines to help with side effects. Follow all directions for their use. This drug may make you feel generally unwell. This is not uncommon, as chemotherapy can affect healthy cells as well as cancer cells. Report any side effects. Continue your course of treatment even though you feel ill unless your doctor tells you to stop. Call your doctor or health care professional for advice if you get a fever, chills or sore throat, or other symptoms of a cold or flu. Do not treat yourself. This drug decreases your body's ability to fight infections. Try to avoid being around people who are sick. This medicine may increase your risk to bruise or bleed. Call your doctor or health care professional if you notice any unusual bleeding. Be careful brushing and flossing your teeth or using a toothpick because you may get an infection or bleed more easily. If you have any dental work done, tell your dentist you are receiving this medicine. Avoid taking products that contain aspirin, acetaminophen, ibuprofen, naproxen, or ketoprofen unless instructed by your doctor. These medicines may hide a fever. Do not become pregnant while taking this medicine. Women should inform their doctor if they wish to become pregnant or think they might be pregnant. There is a potential for serious side effects to an unborn child. Talk to your health care professional or pharmacist for more information. Do not breast-feed an infant while taking this medicine. Men are advised not to father a child while receiving this medicine. This product may contain alcohol. Ask your pharmacist or healthcare provider if this medicine contains alcohol. Be sure to tell all healthcare providers you are taking this  medicine. Certain medicines, like metronidazole and disulfiram, can cause an unpleasant reaction when taken with alcohol. The reaction includes flushing, headache, nausea, vomiting, sweating, and increased thirst. The reaction can last from 30 minutes to several hours. What side effects may I notice from receiving this medicine? Side effects that you should report to your doctor or health care professional as soon as possible:  allergic reactions like skin rash, itching or hives, swelling of the face, lips, or tongue  breathing problems  changes in vision  fast, irregular heartbeat  high or low blood pressure  mouth sores  pain, tingling, numbness in the hands or feet  signs of decreased platelets or bleeding - bruising, pinpoint red spots on the skin, black, tarry stools, blood in the urine  signs of decreased red blood cells - unusually weak or tired, feeling faint or lightheaded, falls  signs of infection - fever or chills, cough, sore throat, pain or difficulty passing urine  signs and symptoms of liver injury like dark yellow or brown urine; general ill feeling or flu-like symptoms; light-colored stools; loss of appetite; nausea; right upper belly pain; unusually weak or tired; yellowing of the eyes or skin  swelling of the ankles, feet, hands  unusually slow heartbeat Side effects that usually do not require medical attention (report to your doctor or health care professional if they continue or are bothersome):  diarrhea  hair loss  loss of appetite  muscle  or joint pain  nausea, vomiting  pain, redness, or irritation at site where injected  tiredness This list may not describe all possible side effects. Call your doctor for medical advice about side effects. You may report side effects to FDA at 1-800-FDA-1088. Where should I keep my medicine? This drug is given in a hospital or clinic and will not be stored at home. NOTE: This sheet is a summary. It may not  cover all possible information. If you have questions about this medicine, talk to your doctor, pharmacist, or health care provider.  2020 Elsevier/Gold Standard (2016-12-28 13:14:55)  Carboplatin injection What is this medicine? CARBOPLATIN (KAR boe pla tin) is a chemotherapy drug. It targets fast dividing cells, like cancer cells, and causes these cells to die. This medicine is used to treat ovarian cancer and many other cancers. This medicine may be used for other purposes; ask your health care provider or pharmacist if you have questions. COMMON BRAND NAME(S): Paraplatin What should I tell my health care provider before I take this medicine? They need to know if you have any of these conditions:  blood disorders  hearing problems  kidney disease  recent or ongoing radiation therapy  an unusual or allergic reaction to carboplatin, cisplatin, other chemotherapy, other medicines, foods, dyes, or preservatives  pregnant or trying to get pregnant  breast-feeding How should I use this medicine? This drug is usually given as an infusion into a vein. It is administered in a hospital or clinic by a specially trained health care professional. Talk to your pediatrician regarding the use of this medicine in children. Special care may be needed. Overdosage: If you think you have taken too much of this medicine contact a poison control center or emergency room at once. NOTE: This medicine is only for you. Do not share this medicine with others. What if I miss a dose? It is important not to miss a dose. Call your doctor or health care professional if you are unable to keep an appointment. What may interact with this medicine?  medicines for seizures  medicines to increase blood counts like filgrastim, pegfilgrastim, sargramostim  some antibiotics like amikacin, gentamicin, neomycin, streptomycin, tobramycin  vaccines Talk to your doctor or health care professional before taking any of  these medicines:  acetaminophen  aspirin  ibuprofen  ketoprofen  naproxen This list may not describe all possible interactions. Give your health care provider a list of all the medicines, herbs, non-prescription drugs, or dietary supplements you use. Also tell them if you smoke, drink alcohol, or use illegal drugs. Some items may interact with your medicine. What should I watch for while using this medicine? Your condition will be monitored carefully while you are receiving this medicine. You will need important blood work done while you are taking this medicine. This drug may make you feel generally unwell. This is not uncommon, as chemotherapy can affect healthy cells as well as cancer cells. Report any side effects. Continue your course of treatment even though you feel ill unless your doctor tells you to stop. In some cases, you may be given additional medicines to help with side effects. Follow all directions for their use. Call your doctor or health care professional for advice if you get a fever, chills or sore throat, or other symptoms of a cold or flu. Do not treat yourself. This drug decreases your body's ability to fight infections. Try to avoid being around people who are sick. This medicine may increase your risk  to bruise or bleed. Call your doctor or health care professional if you notice any unusual bleeding. Be careful brushing and flossing your teeth or using a toothpick because you may get an infection or bleed more easily. If you have any dental work done, tell your dentist you are receiving this medicine. Avoid taking products that contain aspirin, acetaminophen, ibuprofen, naproxen, or ketoprofen unless instructed by your doctor. These medicines may hide a fever. Do not become pregnant while taking this medicine. Women should inform their doctor if they wish to become pregnant or think they might be pregnant. There is a potential for serious side effects to an unborn child.  Talk to your health care professional or pharmacist for more information. Do not breast-feed an infant while taking this medicine. What side effects may I notice from receiving this medicine? Side effects that you should report to your doctor or health care professional as soon as possible:  allergic reactions like skin rash, itching or hives, swelling of the face, lips, or tongue  signs of infection - fever or chills, cough, sore throat, pain or difficulty passing urine  signs of decreased platelets or bleeding - bruising, pinpoint red spots on the skin, black, tarry stools, nosebleeds  signs of decreased red blood cells - unusually weak or tired, fainting spells, lightheadedness  breathing problems  changes in hearing  changes in vision  chest pain  high blood pressure  low blood counts - This drug may decrease the number of white blood cells, red blood cells and platelets. You may be at increased risk for infections and bleeding.  nausea and vomiting  pain, swelling, redness or irritation at the injection site  pain, tingling, numbness in the hands or feet  problems with balance, talking, walking  trouble passing urine or change in the amount of urine Side effects that usually do not require medical attention (report to your doctor or health care professional if they continue or are bothersome):  hair loss  loss of appetite  metallic taste in the mouth or changes in taste This list may not describe all possible side effects. Call your doctor for medical advice about side effects. You may report side effects to FDA at 1-800-FDA-1088. Where should I keep my medicine? This drug is given in a hospital or clinic and will not be stored at home. NOTE: This sheet is a summary. It may not cover all possible information. If you have questions about this medicine, talk to your doctor, pharmacist, or health care provider.  2020 Elsevier/Gold Standard (2007-08-01  14:38:05)  Coronavirus (COVID-19) Are you at risk?  Are you at risk for the Coronavirus (COVID-19)?  To be considered HIGH RISK for Coronavirus (COVID-19), you have to meet the following criteria:  . Traveled to [place], [place], [place], [place] or [place]; or in the [place] to [place], [place], [place], or [place]; and have fever, cough, and shortness of breath within the last 2 weeks of travel OR . Been in close contact with a person diagnosed with COVID-19 within the last 2 weeks and have fever, cough, and shortness of breath . IF YOU DO NOT MEET THESE CRITERIA, YOU ARE CONSIDERED LOW RISK FOR COVID-19.  What to do if you are HIGH RISK for COVID-19?  [name] If you are having a medical emergency, call 911. . Seek medical care right away. Before you go to a doctor's office, urgent care or emergency department, call ahead and tell them about your recent travel,  contact with someone diagnosed with COVID-19, and your symptoms. You should receive instructions from your physician's office regarding next steps of care.  . When you arrive at healthcare provider, tell the healthcare staff immediately you have returned from visiting [place], [place], [place], [place] or [place]; or traveled in the [place] to [place], [place], [place], or [place]; in the last two weeks or you have been in close contact with a person diagnosed with COVID-19 in the last 2 weeks.   . Tell the health care staff about your symptoms: fever, cough and shortness of breath. . After you have been seen by a medical provider, you will be either: o Tested for (COVID-19) and discharged home on quarantine except to seek medical care if symptoms worsen, and asked to  - Stay home and avoid contact with others until you get your results (4-5 days)  - Avoid travel on public transportation if possible (such as bus, train, or airplane) or o Sent to the Emergency Department by EMS for evaluation, COVID-19 testing, and  possible admission depending on your condition and test results.  What to do if you are LOW RISK for COVID-19?  Reduce your risk of any infection by using the same precautions used for avoiding the common cold or flu:  [name] Wash your hands often with soap and warm water for at least 20 seconds.  If soap and water are not readily available, use an alcohol-based hand sanitizer with at least 60% alcohol.  . If coughing or sneezing, cover your mouth and nose by coughing or sneezing into the elbow areas of your shirt or coat, into a tissue or into your sleeve (not your hands). . Avoid shaking hands with others and consider head nods or verbal greetings only. . Avoid touching your eyes, nose, or mouth with unwashed hands.  . Avoid close contact with people who are sick. . Avoid places or events with large numbers of people in one location, like concerts or sporting events. . Carefully consider travel plans you have or are making. . If you are planning any travel outside or inside the [place], visit the CDC's Travelers' Health webpage for the latest health notices. . If you have some symptoms but not all symptoms, continue to monitor at home and seek medical attention if your symptoms worsen. . If you are having a medical emergency, call 911.   [place] / e-Visit: [email or web address]         [facility]: [facility]: [phone]                   [facility]: [phone]

## 2019-02-13 ENCOUNTER — Other Ambulatory Visit: Payer: Self-pay

## 2019-02-13 ENCOUNTER — Ambulatory Visit
Admission: RE | Admit: 2019-02-13 | Discharge: 2019-02-13 | Disposition: A | Discharge status: Home / Self Care | Payer: Medicaid Other | Source: Ambulatory Visit | Attending: Radiation Oncology | Admitting: Radiation Oncology

## 2019-02-13 ENCOUNTER — Other Ambulatory Visit: Payer: Self-pay | Admitting: *Deleted

## 2019-02-13 ENCOUNTER — Telehealth: Payer: Self-pay | Admitting: *Deleted

## 2019-02-13 DIAGNOSIS — C3411 Malignant neoplasm of upper lobe, right bronchus or lung: Secondary | ICD-10-CM | POA: Diagnosis not present

## 2019-02-13 DIAGNOSIS — Z510 Encounter for antineoplastic radiation therapy: Secondary | ICD-10-CM | POA: Diagnosis not present

## 2019-02-13 DIAGNOSIS — C3491 Malignant neoplasm of unspecified part of right bronchus or lung: Secondary | ICD-10-CM | POA: Diagnosis not present

## 2019-02-13 DIAGNOSIS — I871 Compression of vein: Secondary | ICD-10-CM | POA: Diagnosis not present

## 2019-02-13 NOTE — Telephone Encounter (Signed)
Called pt to see how he did with his treatment yest with Taxol & Carboplatin. He sounded very cheery & denies any side effects.  Asked about his mouth & eating & swallowing since using the carafate & he reports that it must be helping b/c he has been able to eat.  He reports knowing how to reach [place] & will call if questions/concerns.

## 2019-02-13 NOTE — Telephone Encounter (Signed)
-----   Message from Zola Button, RN sent at 02/12/2019 12:17 PM EDT ----- Regarding: Dr. Irene Limbo; First time F/U Call Patient received first time Taxol/Carboplatin on 02/12/19. Tolerated well. Is having issues with throat irritation from radiation, but Dr. Tammi Klippel called in some Carafate that patient should be picking up. Thank you! -Althia Forts

## 2019-02-14 ENCOUNTER — Ambulatory Visit
Admission: RE | Admit: 2019-02-14 | Discharge: 2019-02-14 | Disposition: A | Discharge status: Home / Self Care | Payer: Medicaid Other | Source: Ambulatory Visit | Attending: Radiation Oncology | Admitting: Radiation Oncology

## 2019-02-14 ENCOUNTER — Other Ambulatory Visit: Payer: Self-pay

## 2019-02-14 DIAGNOSIS — I871 Compression of vein: Secondary | ICD-10-CM | POA: Diagnosis not present

## 2019-02-14 DIAGNOSIS — C3491 Malignant neoplasm of unspecified part of right bronchus or lung: Secondary | ICD-10-CM | POA: Diagnosis not present

## 2019-02-14 DIAGNOSIS — C3411 Malignant neoplasm of upper lobe, right bronchus or lung: Secondary | ICD-10-CM | POA: Diagnosis not present

## 2019-02-14 DIAGNOSIS — Z510 Encounter for antineoplastic radiation therapy: Secondary | ICD-10-CM | POA: Diagnosis not present

## 2019-02-15 ENCOUNTER — Ambulatory Visit
Admission: RE | Admit: 2019-02-15 | Discharge: 2019-02-15 | Disposition: A | Discharge status: Home / Self Care | Payer: Medicaid Other | Source: Ambulatory Visit | Attending: Radiation Oncology | Admitting: Radiation Oncology

## 2019-02-15 ENCOUNTER — Other Ambulatory Visit: Payer: Self-pay

## 2019-02-15 DIAGNOSIS — I871 Compression of vein: Secondary | ICD-10-CM | POA: Diagnosis not present

## 2019-02-15 DIAGNOSIS — C3491 Malignant neoplasm of unspecified part of right bronchus or lung: Secondary | ICD-10-CM | POA: Diagnosis not present

## 2019-02-15 DIAGNOSIS — Z510 Encounter for antineoplastic radiation therapy: Secondary | ICD-10-CM | POA: Diagnosis not present

## 2019-02-15 DIAGNOSIS — C3411 Malignant neoplasm of upper lobe, right bronchus or lung: Secondary | ICD-10-CM | POA: Diagnosis not present

## 2019-02-16 ENCOUNTER — Other Ambulatory Visit: Payer: Self-pay

## 2019-02-16 ENCOUNTER — Ambulatory Visit
Admission: RE | Admit: 2019-02-16 | Discharge: 2019-02-16 | Disposition: A | Discharge status: Home / Self Care | Payer: Medicaid Other | Source: Ambulatory Visit | Attending: Radiation Oncology | Admitting: Radiation Oncology

## 2019-02-16 DIAGNOSIS — C3411 Malignant neoplasm of upper lobe, right bronchus or lung: Secondary | ICD-10-CM | POA: Diagnosis not present

## 2019-02-16 DIAGNOSIS — Z510 Encounter for antineoplastic radiation therapy: Secondary | ICD-10-CM | POA: Diagnosis not present

## 2019-02-16 DIAGNOSIS — C3491 Malignant neoplasm of unspecified part of right bronchus or lung: Secondary | ICD-10-CM | POA: Diagnosis not present

## 2019-02-16 DIAGNOSIS — I871 Compression of vein: Secondary | ICD-10-CM | POA: Diagnosis not present

## 2019-02-16 NOTE — Progress Notes (Signed)
HEMATOLOGY/ONCOLOGY CONSULTATION NOTE  Date of Service: 02/19/2019  Patient Care Team: [name], MD as PCP - General (Family Medicine)  CHIEF COMPLAINTS/PURPOSE OF CONSULTATION:  Newly diagnosed Metastatic Poorly differentiated lung adenocarcinoma  HISTORY OF PRESENTING ILLNESS:  David. David Berry is a 53 year old male with no significant medical history.  He presented to the hospital with progressive right arm and chest pain with new right-sided neck swelling.  The patient states that he was diagnosed with carpal tunnel syndrome received an injection in August.  However, he continued to have progressive pain up and down his right arm that would also radiate to his right chest.  He took ibuprofen with no improvement.  1 week prior to admission, he developed new right sided neck swelling.  He presented to the emergency room for further evaluation.  Work-up in the emergency room was significant for a CT angiogram of the neck and chest which revealed a bulky soft tissue mass in the right neck continuing into the upper chest most compatible with extensive metastatic lymphadenopathy.  This mass measures up to 34 mm and encases multiple vessels including the right upper lobe pulmonary vessels, superior vena cava, and brachiocephalic vessel.  He also had a large poorly defined malignant appearing infiltrative mass measuring 6.2 x 5.6 x 7.9 cm within the right anterior and middle mediastinum with suspected invasion of the distal left brachiocephalic vein and probable tumor occlusion of the right subclavian and jugular veins.  There is also suspected tumor invasion of the upper SVC with string-like narrowing of the SVC but with patency of the SVC just above the right atrium.  The tumor abuts the aorta and great vessels and also results in significant narrowing of the right upper lobe pulmonary arterial vessels.  He also had a CT of the abdomen pelvis which showed a 15 mm heterogeneously enhancing lesion in  the upper pole of the right kidney concerning for renal cell carcinoma, no lymphadenopathy in the abdomen or pelvis, focal hyperenhancement of the anterior left liver most likely related to aberrant venous anatomy/flow, sclerotic lesions in the right acetabulum and left iliac bone are indeterminate.  These are likely bone islands but attention on follow-up is recommended a metastatic disease cannot be excluded.  MRI of the brain with and without contrast did not show any evidence of intracranial metastatic disease. The patient underwent a CT-guided biopsy in interventional radiology on 01/22/2019.  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   When seen today, patient reports ongoing pain and swelling to his right arm and right side of his neck.  Reports ongoing pain to his right chest.  He reports intermittent facial swelling which is worse in the morning and improves throughout the day.  He reports anorexia and a weight loss of 20 pounds over the past few weeks. He has had dizziness for the past few months.  Denies headaches.  He also noted that on the day of admission his peripheral vision was poor.  His vision is now improved.  He denies food getting stuck or difficulty swallowing but does feel as though his throat is "tight."  He reports a nonproductive cough.  Denies fevers and chills.  He is not really having any shortness of breath.  Denies abdominal pain, nausea, vomiting, constipation, diarrhea.  He has not noticed any epistaxis, hemoptysis, hematemesis, hematuria, melena, hematochezia.  The patient is single.  He has 1 daughter who lives in [place],  [place].  Reports occasional alcohol use and currently smokes 3 cigars a day since 1997.  He is currently living in a rooming house with other roommates who all have their own room.  They share a common bathroom and kitchen.  Medical oncology was asked see the patient  to make recommendations regarding his newly diagnosed poorly differentiated carcinoma.  History reviewed. No pertinent past medical history.  INTERVAL HISTORY:   David Berry. is a wonderful 53 y.o. male who is here for evaluation and management of newly diagnosed metastatic poorly differentiated lung adenocarcinoma. He is here for C2D1 of Carbopaltin & Taxol. The patient's last visit with David Berry was on 02/05/2019. The pt reports that he is doing well overall.  The pt reports that he is having issues taking the Lovenox and has missed 2 doses so far. He has carpal tunnel in his right hand so he has had to use his left hand to inject the Lovenox, which is not working. Pt has not been doing anything for his carpal tunnel and states that the splint is too large to help him. Pt reports that he has been doing well with the Carbopaltin & Taxol and only became nauseous at the end of the week after his first treatment. Pt reports that he was taking Sucralfate four times a day but did not feel that it was helping. He is now taking 1 Vicodin every 5-6 hours, that helps him immediately. When he is constipated he uses Magnesium Citrate. His hemorrhoids have been manageable and he is no longer using Preparation H but he reports that there was some hemorrhoidal bleeding this morning. Pt still has Zofran but has not been using it. He has been taking Dexamethazone every day. His radiation is scheduled to end on 02/27/2019. Pt notes some tingling and numbness in his hands and feet occasionally but feels that the way that he sits could contribute to this. He has been using Boost supplements and has seen Ernestene Kiel. Pt reports that he has been eating well considering how much his throat hurts. Pt's throat hurts all of the time, even when he is not swallowing and he sometimes gets food stuck in his throat.   Lab results today (02/19/19) of CBC w/diff and CMP is as follows: all values are WNL except for Hgb at 11.5, HCT  at 35.8, RDW at 15.8, Lymphs Abs at 0.3K, Glucose at 134, Albumin at 3.3, Total Bilirubin at <0.2. 02/19/2019 Magnesium at 1.9  On review of systems, pt reports nausea, constipation, bleeding hemorrhoids, occasional numbness/tingling in hands/feet, throat pain, pain when swallowing and denies any other symptoms.   MEDICAL HISTORY:  No past medical history on file.  SURGICAL HISTORY: Past Surgical History:  Procedure Laterality Date   APPENDECTOMY     teenager   INGUINAL HERNIA REPAIR Right 2016, 2017   [facility], 2018 [facility] mesh    SOCIAL HISTORY: Social History   Socioeconomic History   Marital status: Single    Spouse name: Not on file   Number of children: 1   Years of education: GED   Highest education level: Not on file  Occupational History    Comment: [employer]  Social Needs   Financial resource strain: Not on file   Food insecurity    Worry: Not on file    Inability: Not on file   Transportation needs    Medical: Not on file    Non-medical: Not on file  Tobacco Use  Smoking status: Current Every Day Smoker    Types: Cigars   Smokeless tobacco: Never Used   Tobacco comment: smokes 3 black and mild cigars per day  Substance and Sexual Activity   Alcohol use: Yes   Drug use: No   Sexual activity: Yes  Lifestyle   Physical activity    Days per week: Not on file    Minutes per session: Not on file   Stress: Not on file  Relationships   Social connections    Talks on phone: Not on file    Gets together: Not on file    Attends religious service: Not on file    Active member of club or organization: Not on file    Attends meetings of clubs or organizations: Not on file    Relationship status: Not on file   Intimate partner violence    Fear of current or ex partner: Not on file    Emotionally abused: Not on file    Physically abused: Not on file    Forced sexual activity: Not on file  Other Topics Concern    Not on file  Social History Narrative   Lives alone   Caffeine- coffee, 20 oz daily, tea occas    FAMILY HISTORY: Family History  Problem Relation Age of Onset   Prostate cancer Father     ALLERGIES:  is allergic to pregabalin.  MEDICATIONS:  Current Outpatient Medications  Medication Sig Dispense Refill   dexamethasone (DECADRON) 4 MG tablet Take 2 tablets (8 mg total) by mouth daily. Start the day after chemotherapy for 2 days. 30 tablet 1   enoxaparin (LOVENOX) 60 MG/0.6ML injection Inject 0.5 mLs (50 mg total) into the skin 2 (two) times daily. 36 mL 0   HYDROcodone-acetaminophen (NORCO/VICODIN) 5-325 MG tablet Take 1-2 tablets by mouth every 4 (four) hours as needed for moderate pain. 30 tablet 0   LORazepam (ATIVAN) 0.5 MG tablet Take 1 tablet (0.5 mg total) by mouth every 6 (six) hours as needed (Nausea or vomiting). 30 tablet 0   prochlorperazine (COMPAZINE) 10 MG tablet Take 1 tablet (10 mg total) by mouth every 6 (six) hours as needed (Nausea or vomiting). 30 tablet 1   docusate sodium (COLACE) 100 MG capsule Take 1 capsule (100 mg total) by mouth 2 (two) times daily. (Patient not taking: Reported on 02/19/2019) 60 capsule 0   ondansetron (ZOFRAN) 8 MG tablet Take 1 tablet (8 mg total) by mouth 2 (two) times daily as needed for refractory nausea / vomiting. Start on day 3 after chemo. (Patient not taking: Reported on 02/19/2019) 30 tablet 1   sucralfate (CARAFATE) 1 g tablet Take 1 tablet (1 g total) by mouth 4 (four) times daily -  with meals and at bedtime. Dissolve tablet in 6-8 oz water and then drink mixture (Patient not taking: Reported on 02/19/2019) 120 tablet 1   No current facility-administered medications for this visit.     REVIEW OF SYSTEMS:    A 10+ POINT REVIEW OF SYSTEMS WAS OBTAINED including neurology, dermatology, psychiatry, cardiac, respiratory, lymph, extremities, GI, GU, Musculoskeletal, constitutional, breasts, reproductive, HEENT.  All  pertinent positives are noted in the HPI.  All others are negative.   PHYSICAL EXAMINATION: ECOG PERFORMANCE STATUS: 1 - Symptomatic but completely ambulatory  . Vitals:   02/19/19 0928  BP: 100/64  Pulse: 80  Resp: 18  Temp: 98.3 F (36.8 C)  SpO2: 100%   Filed Weights   02/19/19 0928  Weight: 124  lb 9.6 oz (56.5 kg)   .Body mass index is 17.38 kg/m.   GENERAL:alert, in no acute distress and comfortable SKIN: no acute rashes, no significant lesions EYES: conjunctiva are pink and non-injected, sclera anicteric OROPHARYNX: MMM, no exudates, no oropharyngeal erythema or ulceration NECK: supple, no JVD LYMPH:  no palpable lymphadenopathy in the cervical, axillary or inguinal regions LUNGS: clear to auscultation b/l with normal respiratory effort HEART: regular rate & rhythm ABDOMEN:  normoactive bowel sounds , non tender, not distended. No palpable hepatosplenomegaly.  Extremity: no pedal edema PSYCH: alert & oriented x 3 with fluent speech NEURO: no focal motor/sensory deficits  LABORATORY DATA:  I have reviewed the data as listed  . CBC Latest Ref Rng & Units 02/19/2019 02/12/2019 01/29/2019  WBC 4.0 - 10.5 K/uL 5.0 3.1(L) 6.9  Hemoglobin 13.0 - 17.0 g/dL 11.5(L) 11.9(L) 10.4(L)  Hematocrit 39.0 - 52.0 % 35.8(L) 37.0(L) 33.0(L)  Platelets 150 - 400 K/uL 356 381 490(H)    . CMP Latest Ref Rng & Units 02/19/2019 02/12/2019 01/27/2019  Glucose 70 - 99 mg/dL 134(H) 117(H) 106(H)  BUN 6 - 20 mg/dL _0 Creatinine 0.61 - 1.24 mg/dL 0.80 0.84 0.67  Sodium 135 - 145 mmol/L 139 139 135  Potassium 3.5 - 5.1 mmol/L 4.0 3.8 3.8  Chloride 98 - 111 mmol/L 103 103 103  CO2 22 - 32 mmol/L _1 Calcium 8.9 - 10.3 mg/dL 9.4 9.4 9.6  Total Protein 6.5 - 8.1 g/dL 6.9 7.1 -  Total Bilirubin 0.3 - 1.2 mg/dL <0.2(L) <0.2(L) -  Alkaline Phos 38 - 126 U/L 116 140(H) -  AST 15 - 41 U/L 20 17 -  ALT 0 - 44 U/L 39 22 -    01/22/2019 [facility]: Tumor Mutational Burden      01/22/2019 PD-L1 Immunohistochemistry Analysis    01/22/2019 Soft Tissue Needle Core Biopsy Surgical Pathology     RADIOGRAPHIC STUDIES: I have personally reviewed the radiological images as listed and agreed with the findings in the report. Ct Angio Neck W And/or Wo Contrast  Result Date: 01/20/2019 CLINICAL DATA:  53 year old male with unexplained neck swelling for 3 months. Evidence of right IJ occlusion on thyroid ultrasound earlier today. EXAM: CT ANGIOGRAPHY NECK TECHNIQUE: Multidetector CT imaging of the neck was performed using the standard protocol during bolus administration of intravenous contrast. Multiplanar CT image reconstructions and MIPs were obtained to evaluate the vascular anatomy. Carotid stenosis measurements (when applicable) are obtained utilizing NASCET criteria, using the distal internal carotid diameter as the denominator. CONTRAST:  161m OMNIPAQUE IOHEXOL 350 MG/ML SOLN COMPARISON:  CTA chest reported separately. Thyroid ultrasound earlier today. FINDINGS: Skeleton: No acute or suspicious osseous lesion from the skull base to the upper chest. Mucous retention cyst in the left maxillary sinus. Mild right mastoid effusion. Upper chest: Mediastinal mass/lymphadenopathy, reported separately today. Other neck: Bulky but indistinct soft tissue masses tracking cephalad in the right neck from the thoracic inlet. Involvement of the right level 4, right level 5, right level 3 and right level 2 nodal stations. Superimposed subcutaneous edema in the region. Estimated individual lymph nodes/soft tissue mass size up to 34 millimeters long axis, 23 millimeters short axis. The level 1 nodal station seems to be spared. The left neck appears relatively spared. Mild leftward mass effect on the trachea and pharynx. The thyroid and pharyngeal soft tissue contours remain within normal limits. The glottis is closed. The parapharyngeal spaces are normal. And superior retropharyngeal space  there is edema/effusion in the lower retropharyngeal space. The sublingual, submandibular and parotid spaces appear spared. Grossly negative visible brain parenchyma. Visualized orbit soft tissues are within normal limits. Aortic arch: 3 vessel arch configuration.  No arch atherosclerosis. Right carotid system: Mass effect on the brachiocephalic artery and to a lesser extent the right carotid space from the chest and neck soft tissue masses. No associated arterial stenosis. Negative right carotid bifurcation and cervical right ICA. Visible right ICA siphon is normal. Left carotid system: Negative aside from mild left ICA tortuosity in the neck. Negative visible left ICA siphon. Vertebral arteries: Mild mass effect on the proximal right subclavian artery due to the chest and neck soft tissue masses. No right subclavian stenosis. Normal right vertebral artery origin. Patent and normal right vertebral artery to the vertebrobasilar junction. Negative visible basilar artery. Negative proximal left subclavian artery and left vertebral artery origin. Mildly non dominant left vertebral artery is patent and negative to the vertebrobasilar junction. Other findings: Delayed venous phase images were also obtained. The right sigmoid sinus and right IJ bulb are patent at the skull base, but the right jugular vein is occluded beginning at the level of the right retromandibular vein, and continuing to the right subclavian/innominate vein confluence. The right innominate vein is occluded in the upper chest on series 19, image 113. The left innominate and left internal jugular vein remain patent. The left sigmoid and transverse sinus are patent. Review of the MIP images confirms the above findings IMPRESSION: 1. Bulky soft tissue masses in the right neck and continuing into the upper chest most compatible with extensive metastatic lymphadenopathy. Individual nodal masses up to 34 mm. 2. Associated thrombosis of the Right IJ, Right  Innominate Vein, and likely also the SVC - See Chest CTA reported separately. 3. Mild retropharyngeal effusion likely related to #2. 4. No arterial abnormality in the neck. Electronically Signed   By: Genevie Ann M.D.   On: 01/20/2019 17:18   David Berry F[facility] Contrast  Result Date: 01/23/2019 CLINICAL DATA:  Renal cell cancer, staging. EXAM: MRI HEAD WITHOUT AND WITH CONTRAST TECHNIQUE: Multiplanar, multiecho pulse sequences of the brain and surrounding structures were obtained without and with intravenous contrast. CONTRAST:  57m GADAVIST GADOBUTROL 1 MMOL/ML IV SOLN COMPARISON:  CT angiogram neck 01/20/2019 FINDINGS: Brain: Multiple sequences are significantly motion degraded, limiting evaluation. This includes mild motion degradation of postcontrast imaging. No abnormal intracranial enhancement is identified to suggest intracranial metastatic disease. No evidence of acute infarct. No evidence of intracranial mass. No midline shift or extra-axial fluid collection. No chronic intracranial hemorrhage. A few scattered punctate foci of T2/FLAIR hyperintensity within the cerebral white matter are nonspecific, but may reflect minimal chronic small vessel ischemic disease. Cerebral volume is normal for age. Vascular: Flow voids maintained within the proximal large arterial vessels. Skull and upper cervical spine: Normal marrow signal. Sinuses/Orbits: Imaged globes and orbits demonstrate no acute abnormality. Complete T2 hyperintense opacification of the left maxillary sinus. Mild scattered mucosal thickening within the remainder of the paranasal sinuses. Right mastoid effusion. IMPRESSION: Motion degraded examination. No evidence of intracranial metastatic disease. A few scattered punctate signal changes within the cerebral white matter are nonspecific, but may reflect minimal chronic small vessel ischemic disease. Complete T2 hyperintense opacification of the left maxillary sinus, nonspecific but possibly reflecting fluid  or a large mucous retention cyst. Right mastoid effusion. Electronically Signed   By: KKellie Simmering  On: 01/23/2019 10:59   Ct Abdomen Pelvis W  Contrast  Result Date: 01/21/2019 CLINICAL DATA:  New diagnosis of mediastinal mass. Evaluate for lymphoma or metastatic disease. EXAM: CT ABDOMEN AND PELVIS WITH CONTRAST TECHNIQUE: Multidetector CT imaging of the abdomen and pelvis was performed using the standard protocol following bolus administration of intravenous contrast. CONTRAST:  1522m OMNIPAQUE IOHEXOL 300 MG/ML  SOLN COMPARISON:  None. FINDINGS: Lower chest: The unremarkable. Hepatobiliary: Hyperenhancing focus in the anterior left liver likely related to anomalous venous anatomy/flow. Liver otherwise unremarkable. Probable layering sludge in the gallbladder lumen No intrahepatic or extrahepatic biliary dilation. Pancreas: No focal mass lesion. No dilatation of the main duct. No intraparenchymal cyst. No peripancreatic edema. Spleen: No splenomegaly. No focal mass lesion. Adrenals/Urinary Tract: No adrenal nodule or mass. 2 mm nonobstructing stone noted lower pole right kidney. 15 mm subcapsular lesion in the upper pole right kidney has heterogeneous enhancement. Left kidney unremarkable. Ureters not well visualized due to lack of retroperitoneal fat but no hydroureter evident. The urinary bladder appears normal for the degree of distention. Stomach/Bowel: Stomach is unremarkable. No gastric wall thickening. No evidence of outlet obstruction. No small bowel or colonic dilatation. Vascular/Lymphatic: No abdominal aortic aneurysm. No discernible lymphadenopathy in the abdomen or pelvis. Reproductive: The prostate gland and seminal vesicles are unremarkable. Other: No intraperitoneal free fluid. Musculoskeletal: 11 mm sclerotic focus noted posterior left iliac bone. 10 mm sclerotic focus noted in the posterior right acetabulum. Increased attenuation in lower lumbar vertebral bodies likely enhancement related to  opacified venous anatomy. IMPRESSION: 1. 15 mm heterogeneously enhancing lesion in the upper pole right kidney. Renal cell carcinoma a concern. MRI without and with contrast recommended to further evaluate. 2. No lymphadenopathy in the abdomen or pelvis. 3. Focal hyperenhancement in the anterior left liver most likely related to aberrant venous anatomy/flow. 4. Sclerotic lesions in the right acetabulum and left iliac bone are indeterminate. Likely bone islands but attention on follow-up recommended as metastatic cannot be excluded. Electronically Signed   By: EMisty StanleyM.D.   On: 01/21/2019 19:41   Ct Biopsy  Result Date: 01/22/2019 CLINICAL DATA:  Mediastinal mass EXAM: CT GUIDED CORE BIOPSY OF MEDIASTINAL MASS ANESTHESIA/SEDATION: Intravenous Fentanyl 584m and Versed 22m56mere administered as conscious sedation during continuous monitoring of the patient?s level of consciousness and physiological / cardiorespiratory status by the radiology RN, with a total moderate sedation time of 16 minutes. PROCEDURE: The procedure risks, benefits, and alternatives were explained to the patient. Questions regarding the procedure were encouraged and answered. The patient understands and consents to the procedure. Select axial scans through the thorax were obtained. The anterior mediastinal mass was localized and an appropriate skin entry site was identified and marked. The operative field was prepped with chlorhexidinein a sterile fashion, and a sterile drape was applied covering the operative field. A sterile gown and sterile gloves were used for the procedure. Local anesthesia was provided with 1% Lidocaine. Under CT fluoroscopic guidance, a 17 gauge trocar needle was advanced to the margin of the lesion. Once needle tip position was confirmed, coaxial 18-gauge core biopsy samples were obtained, submitted in saline to surgical pathology. The guide needle was removed. Postprocedure scan shows no hematoma, pneumothorax,  or other apparent complication. COMPLICATIONS: None immediate FINDINGS: Anterior mediastinal mass was localized and representative core biopsy samples obtained as above. IMPRESSION: Technically successful CT-guided core biopsy of anterior mediastinal mass. Electronically Signed   By: D  Lucrezia EuropeD.   On: 01/22/2019 16:42   Ct Angio Chest Aorta W/cm &/or Wo/cm  Addendum Date:  01/20/2019   ADDENDUM REPORT: 01/20/2019 20:45 ADDENDUM: Clarification to the report. Thrombosis of the right jugular and brachiocephalic veins and probable subclavian vein. Confluence of these vessels is surrounded/occluded by the large mass. There is suspected tumor occlusion/thrombosis of the distal left brachiocephalic vein and upper SVC. Electronically Signed   By: Donavan Foil M.D.   On: 01/20/2019 20:45   Result Date: 01/20/2019 CLINICAL DATA:  Jugular vein thrombosis EXAM: CT ANGIOGRAPHY CHEST WITH CONTRAST TECHNIQUE: Multidetector CT imaging of the chest was performed using the standard protocol during bolus administration of intravenous contrast. Multiplanar CT image reconstructions and MIPs were obtained to evaluate the vascular anatomy. CONTRAST:  151m OMNIPAQUE IOHEXOL 350 MG/ML SOLN COMPARISON:  Thyroid ultrasound 01/20/2019 FINDINGS: Cardiovascular: Satisfactory opacification of the pulmonary arteries to the segmental level. No evidence of pulmonary embolism. Nonaneurysmal aorta. No dissection. Normal heart size. No significant pericardial effusion. Occluded right jugular and probable subclavian veins. Narrowed appearance of the left brachiocephalic vein as it approaches the venous confluence. Narrowed and likely subtotal occlusion of the upper SVC. Patency of the SVC as it enters the right atrium. Severe narrowing of right upper lobe pulmonary arterial vessels. Numerous collateral vessels within the mediastinum. Mediastinum/Nodes: Midline trachea.  No discrete thyroid nodule. Bulky malignant-appearing anterior and middle  mediastinal mass, infiltrative and poorly defined. Mass measures approximately 6.2 cm AP by 5.6 cm transverse by 7.9 cm craniocaudad. It is contiguous with additional mass or nodes in the right hilar region. The mass encases/narrows the right upper lobe pulmonary vessels as well as the superior vena cava. Probable tumor invasion of the distal left brachiocephalic vessel and presumed tumor occlusion of the right brachiocephalic and jugular vessels. Partially visualized enlarged right supraclavicular nodes. Generalized edema within the soft tissues of the thoracic inlet and base of right neck. Fluid-filled esophagus with mild distal circumferential esophageal thickening. Lungs/Pleura: Emphysema. Presumed left greater than right apical scarring with left apical calcification. Posterior right upper lobe subpleural nodular focus of airspace disease measuring 13 mm, series 6, image number 55. Irregular focus of airspace disease, also within the right upper lobe abutting the fissure, series 6, image number 79. No pleural effusion or pneumothorax. Upper Abdomen: No acute abnormality. Musculoskeletal: No chest wall abnormality. No acute or significant osseous findings. Review of the MIP images confirms the above findings. IMPRESSION: 1. No acute pulmonary embolus or aortic dissection. 2. Large poorly defined malignant-appearing infiltrative mass measuring approximately 6.2 x 5.6 x 7.9 cm within the right anterior and middle mediastinum. Suspected invasion of distal left brachiocephalic vein and probable tumor occlusion of the right subclavian and jugular veins. Suspected tumor invasion of the upper SVC with string like narrowing of the SVC, but with patency of the SVC just above the right atrium. Tumor abuts the aorta and great vessels and also results in significant narrowing of right upper lobe pulmonary arterial vessels. Incompletely visualized right supraclavicular adenopathy. Right hilar nodes and or mass. 3. At least 2  foci of somewhat nodular appearing airspace disease in the right upper lobe, either representing pneumonia or foci of neoplasm. 4. Emphysema Emphysema (ICD10-J43.9). Electronically Signed: By: KDonavan FoilM.D. On: 01/20/2019 17:41   [place]  Result Date: 01/20/2019 CLINICAL DATA:  Goiter, neck swelling x3 months EXAM: THYROID ULTRASOUND TECHNIQUE: Ultrasound examination of the thyroid gland and adjacent soft tissues was performed. COMPARISON:  None. FINDINGS: Parenchymal Echotexture: Mildly heterogenous Isthmus: 0.3 cm thickness Right lobe: 6 x 2.1 x 2.3 cm Left lobe: 6 x 2 x 1.8  cm _________________________________________________________ Estimated total number of nodules >/= 1 cm: 0 Number of spongiform nodules >/=  2 cm not described below (TR1): 0 Number of mixed cystic and solid nodules >/= 1.5 cm not described below ([place]): 0 _________________________________________________________ No discrete nodules are seen within the thyroid gland. Suspected echogenic thrombus occluding the right IJ vein. IMPRESSION: 1. Thyromegaly without nodule. 2. 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 The above is in keeping with the ACR TI-RADS recommendations - J Am Coll Radiol 2017;14:587-595. Electronically Signed   By: Lucrezia Europe M.D.   On: 01/20/2019 13:27    ASSESSMENT & PLAN:   This is a 53 year old malewith  1. 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 Acute DVT- Right IJ, Right Innominate Vein, and likely also the SVC- related to malignancy+ tobacco-  on lovenox  4. Symptomatic hemorrhoids  5. S  PLAN:  -Discussed pt labwork today, 02/19/19;  all values are WNL except for Hgb at 11.5,  HCT at 35.8, RDW at 15.8, Lymphs Abs at 0.3K, Glucose at 134, Albumin at 3.3, Total Bilirubin at <0.2. -Discussed 02/19/2019 Magnesium at 1.9 -Will switch pt to Eliquis 62m po BID -Advised pt to take Dexamethazone for 2 days after chemo -Continue palliative RT for impending SVC syndrome and abuttment/potential invasion of large mediastinal arteries. -Will hold off on port for as long as possible  -Pt has no prohibitive toxicities from continuing C2D1 of Carbopaltin & Taxol at this time -Refill Vicodin  -Rx Senna -Rx low-dose Fentanyl Patch -Rx Magic Mouthwash  -Will see back in 1 week with labs  3. Normocytic anemia -Likely due to underlying malignancy -Will check ferritin, iron studies, vitamin B12 level, and RBC folate in the a.m.  4 Thrombocytosis -- likely due to paraneoplastic effect of tumor and reactive due to inflammation and tissue inflammation from RT.  5. Nicotine dependence -He was counseled about tobacco cessation.   FOLLOW UP: RTC with Dr KIrene Limbowith labs in 1 week with next cycle of weekly Carboplatin/Taxol  The total time spent in the appt was 25 minutes and more than 50% was on counseling and direct patient cares.  All of the patient's questions were answered with apparent satisfaction. The patient knows to call the clinic with any problems, questions or concerns.    GSullivan LoneMD MWhitmireAAHIVMS SSedgwick County Memorial HospitalCSixty Fourth Street LLCHematology/Oncology Physician CThe [facility] (Office):       [phone](Work cell):  [phone](Fax):           [phone] 02/19/2019 10:06 AM  I, JYevette Edwards am acting as a scribe for David Berry   .I have reviewed the above documentation for accuracy and completeness, and I agree with the above. .Brunetta GeneraMD

## 2019-02-19 ENCOUNTER — Inpatient Hospital Stay: Payer: Medicaid Other

## 2019-02-19 ENCOUNTER — Other Ambulatory Visit: Payer: Self-pay

## 2019-02-19 ENCOUNTER — Inpatient Hospital Stay (HOSPITAL_BASED_OUTPATIENT_CLINIC_OR_DEPARTMENT_OTHER): Payer: Medicaid Other | Admitting: Hematology

## 2019-02-19 ENCOUNTER — Inpatient Hospital Stay: Payer: Medicaid Other | Admitting: Nutrition

## 2019-02-19 ENCOUNTER — Ambulatory Visit
Admission: RE | Admit: 2019-02-19 | Discharge: 2019-02-19 | Disposition: A | Discharge status: Home / Self Care | Payer: Medicaid Other | Source: Ambulatory Visit | Attending: Radiation Oncology | Admitting: Radiation Oncology

## 2019-02-19 VITALS — BP 100/64 | HR 80 | Temp 98.3°F | Resp 18 | Ht 71.0 in | Wt 124.6 lb

## 2019-02-19 DIAGNOSIS — C3411 Malignant neoplasm of upper lobe, right bronchus or lung: Secondary | ICD-10-CM | POA: Diagnosis not present

## 2019-02-19 DIAGNOSIS — K5903 Drug induced constipation: Secondary | ICD-10-CM

## 2019-02-19 DIAGNOSIS — C3491 Malignant neoplasm of unspecified part of right bronchus or lung: Secondary | ICD-10-CM

## 2019-02-19 DIAGNOSIS — Z7189 Other specified counseling: Secondary | ICD-10-CM

## 2019-02-19 DIAGNOSIS — Z510 Encounter for antineoplastic radiation therapy: Secondary | ICD-10-CM | POA: Diagnosis not present

## 2019-02-19 DIAGNOSIS — G893 Neoplasm related pain (acute) (chronic): Secondary | ICD-10-CM

## 2019-02-19 DIAGNOSIS — I82621 Acute embolism and thrombosis of deep veins of right upper extremity: Secondary | ICD-10-CM

## 2019-02-19 DIAGNOSIS — I871 Compression of vein: Secondary | ICD-10-CM | POA: Diagnosis not present

## 2019-02-19 DIAGNOSIS — Z5111 Encounter for antineoplastic chemotherapy: Secondary | ICD-10-CM | POA: Diagnosis not present

## 2019-02-19 LAB — CMP (CANCER CENTER ONLY)
ALT: 39 U/L (ref 0–44)
AST: 20 U/L (ref 15–41)
Albumin: 3.3 g/dL — ABNORMAL LOW (ref 3.5–5.0)
Alkaline Phosphatase: 116 U/L (ref 38–126)
Anion gap: 10 (ref 5–15)
BUN: 8 mg/dL (ref 6–20)
CO2: 26 mmol/L (ref 22–32)
Calcium: 9.4 mg/dL (ref 8.9–10.3)
Chloride: 103 mmol/L (ref 98–111)
Creatinine: 0.8 mg/dL (ref 0.61–1.24)
GFR, Est AFR Am: >60 mL/min (ref >60)
GFR, Estimated: >60 mL/min (ref >60)
Glucose, Bld: 134 mg/dL — ABNORMAL HIGH (ref 70–99)
Potassium: 4 mmol/L (ref 3.5–5.1)
Sodium: 139 mmol/L (ref 135–145)
Total Bilirubin: <0.2 mg/dL — ABNORMAL LOW (ref 0.3–1.2)
Total Protein: 6.9 g/dL (ref 6.5–8.1)

## 2019-02-19 LAB — CBC WITH DIFFERENTIAL/PLATELET
Abs Immature Granulocytes: 0.02 10*3/uL (ref 0.00–0.07)
Basophils Absolute: 0 10*3/uL (ref 0.0–0.1)
Basophils Relative: 0 %
Eosinophils Absolute: 0 10*3/uL (ref 0.0–0.5)
Eosinophils Relative: 0 %
HCT: 35.8 % — ABNORMAL LOW (ref 39.0–52.0)
Hemoglobin: 11.5 g/dL — ABNORMAL LOW (ref 13.0–17.0)
Immature Granulocytes: 0 %
Lymphocytes Relative: 6 %
Lymphs Abs: 0.3 10*3/uL — ABNORMAL LOW (ref 0.7–4.0)
MCH: 27 pg (ref 26.0–34.0)
MCHC: 32.1 g/dL (ref 30.0–36.0)
MCV: 84 fL (ref 80.0–100.0)
Monocytes Absolute: 0.5 10*3/uL (ref 0.1–1.0)
Monocytes Relative: 11 %
Neutro Abs: 4.1 10*3/uL (ref 1.7–7.7)
Neutrophils Relative %: 83 %
Platelets: 356 10*3/uL (ref 150–400)
RBC: 4.26 MIL/uL (ref 4.22–5.81)
RDW: 15.8 % — ABNORMAL HIGH (ref 11.5–15.5)
WBC: 5 10*3/uL (ref 4.0–10.5)
nRBC: 0 % (ref 0.0–0.2)

## 2019-02-19 LAB — MAGNESIUM: Magnesium: 1.9 mg/dL (ref 1.7–2.4)

## 2019-02-19 MED ORDER — SODIUM CHLORIDE 0.9 % IV SOLN: 226.6000 mg | Freq: Once | INTRAVENOUS | Status: DC

## 2019-02-19 MED ORDER — SODIUM CHLORIDE 0.9 % IV SOLN
226.6000 mg | Freq: Once | INTRAVENOUS | Status: AC
  Administered 2019-02-19: 230 mg via INTRAVENOUS
  Filled 2019-02-19: qty 23

## 2019-02-19 MED ORDER — SODIUM CHLORIDE 0.9 % IV SOLN
45.0000 mg/m2 | Freq: Once | INTRAVENOUS | Status: AC
  Administered 2019-02-19: 78 mg via INTRAVENOUS
  Filled 2019-02-19: qty 13

## 2019-02-19 MED ORDER — SODIUM CHLORIDE 0.9 % IV SOLN
45.0000 mg/m2 | Freq: Once | INTRAVENOUS | Status: DC
  Filled 2019-02-19: qty 13

## 2019-02-19 MED ORDER — APIXABAN 5 MG PO TABS: 5.0000 mg | ORAL_TABLET | Freq: Two times a day (BID) | ORAL | 5 refills | Status: DC

## 2019-02-19 MED ORDER — DIPHENHYDRAMINE HCL 50 MG/ML IJ SOLN
50.0000 mg | Freq: Once | INTRAMUSCULAR | Status: AC
  Administered 2019-02-19: 50 mg via INTRAVENOUS

## 2019-02-19 MED ORDER — FENTANYL 12 MCG/HR TD PT72: 1.0000 | MEDICATED_PATCH | TRANSDERMAL | 0 refills | Status: DC

## 2019-02-19 MED ORDER — MAGIC MOUTHWASH W/LIDOCAINE: 5.0000 mL | Freq: Four times a day (QID) | ORAL | 1 refills | Status: DC | PRN

## 2019-02-19 MED ORDER — SODIUM CHLORIDE 0.9 % IV SOLN
20.0000 mg | Freq: Once | INTRAVENOUS | Status: AC
  Administered 2019-02-19: 20 mg via INTRAVENOUS
  Filled 2019-02-19: qty 20

## 2019-02-19 MED ORDER — HYDROCODONE-ACETAMINOPHEN 5-325 MG PO TABS: 1.0000 | ORAL_TABLET | ORAL | 0 refills | Status: DC | PRN

## 2019-02-19 MED ORDER — SODIUM CHLORIDE 0.9 % IV SOLN
Freq: Once | INTRAVENOUS | Status: AC
  Administered 2019-02-19: 11:00:00 via INTRAVENOUS
  Filled 2019-02-19: qty 250

## 2019-02-19 MED ORDER — FAMOTIDINE IN NACL 20-0.9 MG/50ML-% IV SOLN
20.0000 mg | Freq: Once | INTRAVENOUS | Status: AC
  Administered 2019-02-19: 20 mg via INTRAVENOUS

## 2019-02-19 MED ORDER — PALONOSETRON HCL INJECTION 0.25 MG/5ML
0.2500 mg | Freq: Once | INTRAVENOUS | Status: AC
  Administered 2019-02-19: 0.25 mg via INTRAVENOUS

## 2019-02-19 MED ORDER — PALONOSETRON HCL INJECTION 0.25 MG/5ML
INTRAVENOUS | Status: AC
  Filled 2019-02-19: qty 5

## 2019-02-19 MED ORDER — FAMOTIDINE IN NACL 20-0.9 MG/50ML-% IV SOLN
INTRAVENOUS | Status: AC
  Filled 2019-02-19: qty 50

## 2019-02-19 MED ORDER — DIPHENHYDRAMINE HCL 50 MG/ML IJ SOLN
INTRAMUSCULAR | Status: AC
  Filled 2019-02-19: qty 1

## 2019-02-19 MED FILL — FENTANYL 12 MCG/HR PT72: 12 | 30 days supply | Qty: 10 | Fill #0

## 2019-02-19 NOTE — Progress Notes (Signed)
Nutrition follow-up completed with patient during infusion for lung cancer. Current weight was documented as 124.6 pounds on October 12 down from 127.4 pounds September 28. Noted labs: Glucose 134 and albumin 3.3.  Patient reports he continues to have painful swallowing.  Reports his pain medication helps. States he was given a prescription for Magic mouthwash today.  Reports he is trying to eat better. He does not like Ensure but will drink boost. Denies other nutrition impact symptoms.  Nutrition diagnosis: Unintended weight loss continues.  Intervention: Patient educated to increase oral intake at mealtimes in between meals. Recommended boost plus 3 times daily between meals. Provided support and encouragement for patient to strive for weight maintenance.  Monitoring, evaluation, goals: Patient will increase calories and protein to minimize further weight loss.  Next visit: Monday, October 19 during infusion.  **Disclaimer: This note was dictated with voice recognition software. Similar sounding words can inadvertently be transcribed and this note may contain transcription errors which may not have been corrected upon publication of note.**

## 2019-02-19 NOTE — Patient Instructions (Signed)
   [facility] Discharge Instructions for Patients Receiving Chemotherapy  Today you received the following chemotherapy agents Taxol and Carboplatin   To help prevent nausea and vomiting after your treatment, we encourage you to take your nausea medication as directed.    If you develop nausea and vomiting that is not controlled by your nausea medication, call the clinic.   BELOW ARE SYMPTOMS THAT SHOULD BE REPORTED IMMEDIATELY:  *FEVER GREATER THAN 100.5 F  *CHILLS WITH OR WITHOUT FEVER  NAUSEA AND VOMITING THAT IS NOT CONTROLLED WITH YOUR NAUSEA MEDICATION  *UNUSUAL SHORTNESS OF BREATH  *UNUSUAL BRUISING OR BLEEDING  TENDERNESS IN MOUTH AND THROAT WITH OR WITHOUT PRESENCE OF ULCERS  *URINARY PROBLEMS  *BOWEL PROBLEMS  UNUSUAL RASH Items with * indicate a potential emergency and should be followed up as soon as possible.  Feel free to call the clinic should you have any questions or concerns. The clinic phone number is [phone].  Please show the CHEMO ALERT CARD at check-in to the Emergency Department and triage nurse.   

## 2019-02-20 ENCOUNTER — Other Ambulatory Visit: Payer: Self-pay

## 2019-02-20 ENCOUNTER — Telehealth: Payer: Self-pay | Admitting: Hematology

## 2019-02-20 ENCOUNTER — Ambulatory Visit
Admission: RE | Admit: 2019-02-20 | Discharge: 2019-02-20 | Disposition: A | Discharge status: Home / Self Care | Payer: Medicaid Other | Source: Ambulatory Visit | Attending: Radiation Oncology | Admitting: Radiation Oncology

## 2019-02-20 DIAGNOSIS — Z510 Encounter for antineoplastic radiation therapy: Secondary | ICD-10-CM | POA: Diagnosis not present

## 2019-02-20 DIAGNOSIS — I871 Compression of vein: Secondary | ICD-10-CM | POA: Diagnosis not present

## 2019-02-20 DIAGNOSIS — C3411 Malignant neoplasm of upper lobe, right bronchus or lung: Secondary | ICD-10-CM | POA: Diagnosis not present

## 2019-02-20 DIAGNOSIS — C3491 Malignant neoplasm of unspecified part of right bronchus or lung: Secondary | ICD-10-CM | POA: Diagnosis not present

## 2019-02-20 MED FILL — MAGIC MOUTHWASH SPC#4: 24 days supply | Qty: 480 | Fill #0

## 2019-02-20 NOTE — Telephone Encounter (Signed)
Per 10/12 los appts already scheduled

## 2019-02-21 ENCOUNTER — Ambulatory Visit
Admission: RE | Admit: 2019-02-21 | Discharge: 2019-02-21 | Disposition: A | Discharge status: Home / Self Care | Payer: Medicaid Other | Source: Ambulatory Visit | Attending: Radiation Oncology | Admitting: Radiation Oncology

## 2019-02-21 ENCOUNTER — Other Ambulatory Visit: Payer: Self-pay

## 2019-02-21 DIAGNOSIS — Z510 Encounter for antineoplastic radiation therapy: Secondary | ICD-10-CM | POA: Diagnosis not present

## 2019-02-21 DIAGNOSIS — C3411 Malignant neoplasm of upper lobe, right bronchus or lung: Secondary | ICD-10-CM | POA: Diagnosis not present

## 2019-02-21 DIAGNOSIS — C3491 Malignant neoplasm of unspecified part of right bronchus or lung: Secondary | ICD-10-CM | POA: Diagnosis not present

## 2019-02-21 DIAGNOSIS — I871 Compression of vein: Secondary | ICD-10-CM | POA: Diagnosis not present

## 2019-02-22 ENCOUNTER — Encounter: Payer: Self-pay | Admitting: Family Medicine

## 2019-02-22 ENCOUNTER — Ambulatory Visit (INDEPENDENT_AMBULATORY_CARE_PROVIDER_SITE_OTHER): Payer: Self-pay | Admitting: Family Medicine

## 2019-02-22 ENCOUNTER — Ambulatory Visit
Admission: RE | Admit: 2019-02-22 | Discharge: 2019-02-22 | Disposition: A | Discharge status: Home / Self Care | Payer: Medicaid Other | Source: Ambulatory Visit | Attending: Radiation Oncology | Admitting: Radiation Oncology

## 2019-02-22 ENCOUNTER — Other Ambulatory Visit: Payer: Self-pay

## 2019-02-22 VITALS — BP 94/70 | HR 78 | Temp 97.8°F | Wt 124.0 lb

## 2019-02-22 DIAGNOSIS — H6122 Impacted cerumen, left ear: Secondary | ICD-10-CM

## 2019-02-22 DIAGNOSIS — I871 Compression of vein: Secondary | ICD-10-CM | POA: Diagnosis not present

## 2019-02-22 DIAGNOSIS — C3491 Malignant neoplasm of unspecified part of right bronchus or lung: Secondary | ICD-10-CM | POA: Diagnosis not present

## 2019-02-22 DIAGNOSIS — G5601 Carpal tunnel syndrome, right upper limb: Secondary | ICD-10-CM

## 2019-02-22 DIAGNOSIS — Z7689 Persons encountering health services in other specified circumstances: Secondary | ICD-10-CM

## 2019-02-22 DIAGNOSIS — Z510 Encounter for antineoplastic radiation therapy: Secondary | ICD-10-CM | POA: Diagnosis not present

## 2019-02-22 DIAGNOSIS — I82C11 Acute embolism and thrombosis of right internal jugular vein: Secondary | ICD-10-CM

## 2019-02-22 DIAGNOSIS — Z87891 Personal history of nicotine dependence: Secondary | ICD-10-CM

## 2019-02-22 DIAGNOSIS — C3411 Malignant neoplasm of upper lobe, right bronchus or lung: Secondary | ICD-10-CM | POA: Diagnosis not present

## 2019-02-22 NOTE — Progress Notes (Signed)
Patient presents to clinic today to establish care.  SUBJECTIVE: PMH: Pt is a 53 yo male with pmh sig for h/o tobacco use, Adenocarcinoma of R lung, and R IJ vein thromboembolism.    H/o tobacco use: -quit 01/20/19 when hospitalized -started at age 89.  Would quit for 3-4 yrs at a time. -smoked cigarettes and cigars  Adenocarcinoma of R lung: -dx'd 01/2019 -followed by Oncology.   -chemo started 10/5. -has radiation 5x per wk -endorses sore throat.  Textured foods difficult to eat/hurt  H/o R IJ vein thromboembolism -noted during hospitalization 9/12 -currently on Lovenox BID.  To start eliquis next week.  Carpal tunnel, right -Endorses history of repetitive movements during work -Followed by [employer] -Wearing splint at night  History of neck/shoulder pain? -Patient states initially injured shoulder while working at [employer] -Initially followed by [employer]. -Later seen by Ortho for cervical radiculopathy -Patient states he has had difficulty communicating with  [employer] in regards to if he still has a job.  Past Medical History:  Diagnosis Date  . Blood in stool   . Cancer ([place])   . 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 GERD (gastroesophageal reflux disease)     Past Surgical History:  Procedure Laterality Date  . APPENDECTOMY     teenager  . INGUINAL HERNIA REPAIR Right 2016, 2017   [place], 2018 [facility]-removed mesh    Current Outpatient Medications on File Prior to Visit  Medication Sig Dispense Refill  . apixaban (ELIQUIS) 5 MG TABS tablet Take 1 tablet (5 mg total) by mouth 2 (two) times daily. 60 tablet 5  . dexamethasone (DECADRON) 4 MG tablet Take 2 tablets (8 mg total) by mouth daily. Start the day after chemotherapy for 2 days. 30 tablet 1  . LORazepam (ATIVAN) 0.5 MG tablet Take 1 tablet (0.5 mg total) by mouth every 6 (six) hours as needed (Nausea or vomiting). 30 tablet 0  . magic mouthwash w/lidocaine SOLN Take 5 mLs by  mouth 4 (four) times daily as needed for mouth pain. Swish in the mouth and swallow for radiation related throat pain 480 mL 1  . ondansetron (ZOFRAN) 8 MG tablet Take 1 tablet (8 mg total) by mouth 2 (two) times daily as needed for refractory nausea / vomiting. Start on day 3 after chemo. 30 tablet 1  . prochlorperazine (COMPAZINE) 10 MG tablet Take 1 tablet (10 mg total) by mouth every 6 (six) hours as needed (Nausea or vomiting). 30 tablet 1  . fentaNYL (DURAGESIC) 12 MCG/HR Place 1 patch onto the skin every 3 (three) days. (Patient not taking: Reported on 02/22/2019) 10 patch 0   No current facility-administered medications on file prior to visit.     Allergies  Allergen Reactions  . Pregabalin Other (See Comments)    Homicidal idealizations, pain in lower extremities      Family History  Problem Relation Age of Onset  . Prostate cancer Father     Social History   Socioeconomic History  . Marital status: Single    Spouse name: Not on file  . Number of children: 1  . Years of education: GED  . Highest education level: Not on file  Occupational History    Comment: fork lift driver  Social Needs  . Financial resource strain: Not on file  . Food insecurity    Worry: Not on file    Inability: Not on file  . Transportation needs    Medical: Not on  file    Non-medical: Not on file  Tobacco Use  . Smoking status: Current Every Day Smoker    Types: Cigars  . Smokeless tobacco: Never Used  . Tobacco comment: smokes 3 black and mild cigars per day  Substance and Sexual Activity  . Alcohol use: Yes  . Drug use: No  . Sexual activity: Yes  Lifestyle  . Physical activity    Days per week: Not on file    Minutes per session: Not on file  . Stress: Not on file  Relationships  . Social Herbalist on phone: Not on file    Gets together: Not on file    Attends religious service: Not on file    Active member of club or organization: Not on file    Attends meetings  of clubs or organizations: Not on file    Relationship status: Not on file  . Intimate partner violence    Fear of current or ex partner: Not on file    Emotionally abused: Not on file    Physically abused: Not on file    Forced sexual activity: Not on file  Other Topics Concern  . Not on file  Social History Narrative   Lives alone   Caffeine- coffee, 20 oz daily, tea occas    ROS General: Denies fever, chills, night sweats, changes in weight, changes in appetite HEENT: Denies headaches, ear pain, changes in vision, rhinorrhea  + sore throat CV: Denies CP, palpitations, SOB, orthopnea  + right IJ vein thromboembolism Pulm: Denies SOB, cough, wheezing  + lung cancer GI: Denies abdominal pain, nausea, vomiting, diarrhea, constipation GU: Denies dysuria, hematuria, frequency, vaginal discharge Msk: Denies muscle cramps, joint pains  + right wrist pain, neck pain Neuro: Denies weakness, numbness, tingling Skin: Denies rashes, bruising Psych: Denies depression, anxiety, hallucinations   BP 94/70 (BP Location: Left Arm)   Pulse 78   Temp 97.8 F (36.6 C)   Wt 124 lb (56.2 kg)   SpO2 98%   BMI 17.29 kg/m   Physical Exam Gen. Pleasant, well developed, well-nourished, in NAD HEENT - [place]/AT, PERRL, EOMI, no scleral icterus, no nasal drainage, pharynx without erythema or exudate.  Left canal occluded with cerumen.  Right TM normal.  Left TM normal after irrigation. Lungs: no use of accessory muscles, CTAB, no wheezes, rales or rhonchi Cardiovascular: RRR, No r/g/m, no peripheral edema Musculoskeletal: No deformities, moves all four extremities, no cyanosis or clubbing, normal tone Neuro:  A&Ox3, CN II-XII intact, normal gait Skin:  Warm, dry, intact, no lesions Psych: normal affect, mood appropriate  Recent Results (from the past 2160 hour(s))  Basic metabolic panel     Status: Abnormal   Collection Time: 01/20/19 11:57 AM  Result Value Ref Range   Sodium 137 135 - 145 mmol/L    Potassium 4.4 3.5 - 5.1 mmol/L   Chloride 103 98 - 111 mmol/L   CO2 21 (L) 22 - 32 mmol/L   Glucose, Bld 87 70 - 99 mg/dL   BUN 8 6 - 20 mg/dL   Creatinine, Ser 0.71 0.61 - 1.24 mg/dL   Calcium 10.0 8.9 - 10.3 mg/dL   GFR calc non Af Amer >60 >60 mL/min   GFR calc Af Amer >60 >60 mL/min   Anion gap 13 5 - 15    Comment: Performed at [facility], [address], [place], [place] [address]  CBC with Differential     Status: Abnormal  Collection Time: 01/20/19 11:57 AM  Result Value Ref Range   WBC 9.0 4.0 - 10.5 K/uL   RBC 4.40 4.22 - 5.81 MIL/uL   Hemoglobin 12.0 (L) 13.0 - 17.0 g/dL   HCT 37.4 (L) 39.0 - 52.0 %   MCV 85.0 80.0 - 100.0 fL   MCH 27.3 26.0 - 34.0 pg   MCHC 32.1 30.0 - 36.0 g/dL   RDW 14.7 11.5 - 15.5 %   Platelets 382 150 - 400 K/uL   nRBC 0.0 0.0 - 0.2 %   Neutrophils Relative % 66 %   Neutro Abs 6.0 1.7 - 7.7 K/uL   Lymphocytes Relative 19 %   Lymphs Abs 1.7 0.7 - 4.0 K/uL   Monocytes Relative 13 %   Monocytes Absolute 1.2 (H) 0.1 - 1.0 K/uL   Eosinophils Relative 1 %   Eosinophils Absolute 0.1 0.0 - 0.5 K/uL   Basophils Relative 1 %   Basophils Absolute 0.1 0.0 - 0.1 K/uL   Immature Granulocytes 0 %   Abs Immature Granulocytes 0.03 0.00 - 0.07 K/uL    Comment: Performed at [facility], [address] [place], [place] [address]  TSH     Status: None   Collection Time: 01/20/19 11:57 AM  Result Value Ref Range   TSH 2.020 0.350 - 4.500 uIU/mL    Comment: Performed by a 3rd Generation assay with a functional sensitivity of <=0.01 uIU/mL. Performed at [facility], [place] [address], [place], [place] [address]   Protime-INR     Status: None   Collection Time: 01/20/19  1:57 PM  Result Value Ref Range   Prothrombin Time 14.8 11.4 - 15.2 seconds   INR 1.2 0.8 - 1.2    Comment: (NOTE) INR goal varies based on device and disease states. Performed at [facility], [place] [address] [place], [place] [address]   APTT     Status:  None   Collection Time: 01/20/19  1:57 PM  Result Value Ref Range   aPTT 30 24 - 36 seconds    Comment: Performed at [facility] [address] [place], [place] [address]  SARS Coronavirus 2 [facility] order, Performed in [facility] hospital lab) Nasopharyngeal Nasopharyngeal Swab     Status: None   Collection Time: 01/20/19  6:30 PM   Specimen: Nasopharyngeal Swab  Result Value Ref Range   SARS Coronavirus 2 NEGATIVE NEGATIVE    Comment: (NOTE) If result is NEGATIVE SARS-CoV-2 target nucleic acids are NOT DETECTED. The SARS-CoV-2 RNA is generally detectable in upper and lower  respiratory specimens during the acute phase of infection. The lowest  concentration of SARS-CoV-2 viral copies this assay can detect is 250  copies / mL. A negative result does not preclude SARS-CoV-2 infection  and should not be used as the sole basis for treatment or other  patient management decisions.  A negative result may occur with  improper specimen collection / handling, submission of specimen other  than nasopharyngeal swab, presence of viral mutation(s) within the  areas targeted by this assay, and inadequate number of viral copies  (<250 copies / mL). A negative result must be combined with clinical  observations, patient history, and epidemiological information. If result is POSITIVE SARS-CoV-2 target nucleic acids are DETECTED. The SARS-CoV-2 RNA is generally detectable in upper and lower  respiratory specimens dur ing the acute phase of infection.  Positive  results are indicative of active infection with SARS-CoV-2.  Clinical  correlation with patient history and other diagnostic information is  necessary to determine patient infection status.  Positive results do  not rule out bacterial infection or co-infection with other viruses. If result is PRESUMPTIVE POSTIVE SARS-CoV-2 nucleic acids MAY BE PRESENT.   A presumptive positive result was obtained on the submitted specimen  and  confirmed on repeat testing.  While 2019 novel coronavirus  (SARS-CoV-2) nucleic acids may be present in the submitted sample  additional confirmatory testing may be necessary for epidemiological  and / or clinical management purposes  to differentiate between  SARS-CoV-2 and other Sarbecovirus currently known to infect humans.  If clinically indicated additional testing with an alternate test  methodology [phone]) is advised. The SARS-CoV-2 RNA is generally  detectable in upper and lower respiratory sp ecimens during the acute  phase of infection. The expected result is Negative. Fact Sheet for Patients:  StrictlyIdeas.no Fact Sheet for Healthcare Providers: BankingDealers.co.za This test is not yet approved or cleared by the [place] FDA and has been authorized for detection and/or diagnosis of SARS-CoV-2 by FDA under an Emergency Use Authorization (EUA).  This EUA will remain in effect (meaning this test can be used) for the duration of the COVID-19 declaration under Section 564(b)(1) of the Act, 21 U.S.C. section 360bbb-3(b)(1), unless the authorization is terminated or revoked sooner. Performed at [facility], [place] [address], [place], [place] [address]   HIV antibody (Routine Testing)     Status: None   Collection Time: 01/21/19  2:51 AM  Result Value Ref Range   HIV Screen 4th Generation wRfx Non Reactive Non Reactive    Comment: (NOTE) Performed At: [facility] [place], [place] [id number] Rush Farmer MD BU:[id number]   Heparin level (unfractionated)     Status: Abnormal   Collection Time: 01/21/19  2:51 AM  Result Value Ref Range   Heparin Unfractionated <0.10 (L) 0.30 - 0.70 IU/mL    Comment: (NOTE) If heparin results are below expected values, and patient dosage has  been confirmed, suggest follow up testing of antithrombin III levels. Performed at [facility], [place]  [address], [place], [place] [address]   CBC     Status: Abnormal   Collection Time: 01/21/19  2:51 AM  Result Value Ref Range   WBC 7.4 4.0 - 10.5 K/uL   RBC 4.24 4.22 - 5.81 MIL/uL   Hemoglobin 11.4 (L) 13.0 - 17.0 g/dL   HCT 35.6 (L) 39.0 - 52.0 %   MCV 84.0 80.0 - 100.0 fL   MCH 26.9 26.0 - 34.0 pg   MCHC 32.0 30.0 - 36.0 g/dL   RDW 14.7 11.5 - 15.5 %   Platelets 509 (H) 150 - 400 K/uL   nRBC 0.0 0.0 - 0.2 %    Comment: Performed at [facility], [place] [address], [place], [place] [address]  Heparin level (unfractionated)     Status: Abnormal   Collection Time: 01/21/19 12:57 PM  Result Value Ref Range   Heparin Unfractionated <0.10 (L) 0.30 - 0.70 IU/mL    Comment: (NOTE) If heparin results are below expected values, and patient dosage has  been confirmed, suggest follow up testing of antithrombin III levels. Performed at [facility], [place] [address], [place], [place] [address]   ECHOCARDIOGRAM COMPLETE     Status: None   Collection Time: 01/21/19  6:01 PM  Result Value Ref Range   Weight 1,834.23 oz   Height 71 in  BP 106/71 mmHg  Heparin level (unfractionated)     Status: Abnormal   Collection Time: 01/21/19 10:39 PM  Result Value Ref Range   Heparin Unfractionated <0.10 (L) 0.30 - 0.70 IU/mL    Comment: (NOTE) If heparin results are below expected values, and patient dosage has  been confirmed, suggest follow up testing of antithrombin III levels. Performed at [facility], [place] [address], [place], [place] [address]   Surgical PCR screen     Status: Abnormal   Collection Time: 01/22/19  6:01 AM   Specimen: Nasal Mucosa; Nasal Swab  Result Value Ref Range   MRSA, PCR NEGATIVE NEGATIVE   Staphylococcus aureus POSITIVE (A) NEGATIVE    Comment: (NOTE) The Xpert SA Assay (FDA approved for NASAL specimens in patients 7 years of age and older), is one component of a comprehensive surveillance program. It is not intended to diagnose infection nor to guide  or monitor treatment. Performed at [facility], [place] [address] [place], [place] [address]   CBC     Status: Abnormal   Collection Time: 01/22/19  8:00 AM  Result Value Ref Range   WBC 6.7 4.0 - 10.5 K/uL   RBC 4.36 4.22 - 5.81 MIL/uL   Hemoglobin 11.9 (L) 13.0 - 17.0 g/dL   HCT 37.1 (L) 39.0 - 52.0 %   MCV 85.1 80.0 - 100.0 fL   MCH 27.3 26.0 - 34.0 pg   MCHC 32.1 30.0 - 36.0 g/dL   RDW 14.6 11.5 - 15.5 %   Platelets 530 (H) 150 - 400 K/uL   nRBC 0.0 0.0 - 0.2 %    Comment: Performed at [facility], [place] [address], [place], [place] [address]  CBC     Status: Abnormal   Collection Time: 01/23/19  5:52 AM  Result Value Ref Range   WBC 5.3 4.0 - 10.5 K/uL   RBC 4.15 (L) 4.22 - 5.81 MIL/uL   Hemoglobin 11.3 (L) 13.0 - 17.0 g/dL   HCT 34.7 (L) 39.0 - 52.0 %   MCV 83.6 80.0 - 100.0 fL   MCH 27.2 26.0 - 34.0 pg   MCHC 32.6 30.0 - 36.0 g/dL   RDW 14.5 11.5 - 15.5 %   Platelets 523 (H) 150 - 400 K/uL   nRBC 0.0 0.0 - 0.2 %    Comment: Performed at [facility], [place] [address] [place], [place] [address]  Heparin level (unfractionated)     Status: Abnormal   Collection Time: 01/23/19  5:52 AM  Result Value Ref Range   Heparin Unfractionated 0.13 (L) 0.30 - 0.70 IU/mL    Comment: (NOTE) If heparin results are below expected values, and patient dosage has  been confirmed, suggest follow up testing of antithrombin III levels. Performed at [facility], [place] [address] [place], [place] [address]   Heparin level (unfractionated)     Status: None   Collection Time: 01/23/19  1:59 PM  Result Value Ref Range   Heparin Unfractionated 0.33 0.30 - 0.70 IU/mL    Comment: (NOTE) If heparin results are below expected values, and patient dosage has  been confirmed, suggest follow up testing of antithrombin III levels. Performed at [facility], [place] [address] [place], [place] [address]   Heparin level (unfractionated)     Status: None   Collection Time:  01/23/19  8:55 PM  Result Value Ref Range   Heparin Unfractionated 0.37 0.30 - 0.70 IU/mL  Comment: (NOTE) If heparin results are below expected values, and patient dosage has  been confirmed, suggest follow up testing of antithrombin III levels. Performed at [facility], [place] [address] [place], [place] [address]   CBC     Status: Abnormal   Collection Time: 01/24/19  1:51 AM  Result Value Ref Range   WBC 6.1 4.0 - 10.5 K/uL   RBC 3.97 (L) 4.22 - 5.81 MIL/uL   Hemoglobin 10.9 (L) 13.0 - 17.0 g/dL   HCT 33.1 (L) 39.0 - 52.0 %   MCV 83.4 80.0 - 100.0 fL   MCH 27.5 26.0 - 34.0 pg   MCHC 32.9 30.0 - 36.0 g/dL   RDW 14.4 11.5 - 15.5 %   Platelets 482 (H) 150 - 400 K/uL   nRBC 0.0 0.0 - 0.2 %    Comment: Performed at [facility], [place] [address], [place], [place] [address]  Heparin level (unfractionated)     Status: None   Collection Time: 01/24/19  1:51 AM  Result Value Ref Range   Heparin Unfractionated 0.31 0.30 - 0.70 IU/mL    Comment: (NOTE) If heparin results are below expected values, and patient dosage has  been confirmed, suggest follow up testing of antithrombin III levels. Performed at [facility], [place] [address], [place], [place] [address]   CBC     Status: Abnormal   Collection Time: 01/25/19  1:22 AM  Result Value Ref Range   WBC 7.0 4.0 - 10.5 K/uL   RBC 4.06 (L) 4.22 - 5.81 MIL/uL   Hemoglobin 10.8 (L) 13.0 - 17.0 g/dL   HCT 34.1 (L) 39.0 - 52.0 %   MCV 84.0 80.0 - 100.0 fL   MCH 26.6 26.0 - 34.0 pg   MCHC 31.7 30.0 - 36.0 g/dL   RDW 14.5 11.5 - 15.5 %   Platelets 474 (H) 150 - 400 K/uL   nRBC 0.0 0.0 - 0.2 %    Comment: Performed at [facility], [place] [address], [place], [place] [address]  Heparin level (unfractionated)     Status: None   Collection Time: 01/25/19  1:22 AM  Result Value Ref Range   Heparin Unfractionated 0.32 0.30 - 0.70 IU/mL    Comment: (NOTE) If heparin results are below expected values, and patient dosage  has  been confirmed, suggest follow up testing of antithrombin III levels. Performed at [facility], [place] [address] [place], [place] [address]   CBC     Status: Abnormal   Collection Time: 01/26/19 10:04 AM  Result Value Ref Range   WBC 11.3 (H) 4.0 - 10.5 K/uL   RBC 4.34 4.22 - 5.81 MIL/uL   Hemoglobin 11.7 (L) 13.0 - 17.0 g/dL   HCT 37.4 (L) 39.0 - 52.0 %   MCV 86.2 80.0 - 100.0 fL   MCH 27.0 26.0 - 34.0 pg   MCHC 31.3 30.0 - 36.0 g/dL   RDW 14.7 11.5 - 15.5 %   Platelets 520 (H) 150 - 400 K/uL   nRBC 0.0 0.0 - 0.2 %    Comment: Performed at [facility], [place] [address] [place], [place] [address]  Heparin level (unfractionated)     Status: Abnormal   Collection Time: 01/26/19 10:04 AM  Result Value Ref Range   Heparin Unfractionated 0.28 (L) 0.30 - 0.70 IU/mL    Comment: (NOTE) If heparin results are below expected values, and patient dosage has  been  confirmed, suggest follow up testing of antithrombin III levels. Performed at [facility], [place] [address] [place], [place] [address]   Heparin level (unfractionated)     Status: None   Collection Time: 01/26/19  5:03 PM  Result Value Ref Range   Heparin Unfractionated 0.44 0.30 - 0.70 IU/mL    Comment: (NOTE) If heparin results are below expected values, and patient dosage has  been confirmed, suggest follow up testing of antithrombin III levels. Performed at [facility], [place] [address] [place], [place] [address]   Heparin level (unfractionated)     Status: None   Collection Time: 01/27/19  4:25 AM  Result Value Ref Range   Heparin Unfractionated 0.39 0.30 - 0.70 IU/mL    Comment: (NOTE) If heparin results are below expected values, and patient dosage has  been confirmed, suggest follow up testing of antithrombin III levels. Performed at [facility], [place] [address] [place], [place] [address]   Basic metabolic panel     Status:  Abnormal   Collection Time: 01/27/19  4:25 AM  Result Value Ref Range   Sodium 135 135 - 145 mmol/L   Potassium 3.8 3.5 - 5.1 mmol/L   Chloride 103 98 - 111 mmol/L   CO2 23 22 - 32 mmol/L   Glucose, Bld 106 (H) 70 - 99 mg/dL   BUN 9 6 - 20 mg/dL   Creatinine, Ser 0.67 0.61 - 1.24 mg/dL   Calcium 9.6 8.9 - 10.3 mg/dL   GFR calc non Af Amer >60 >60 mL/min   GFR calc Af Amer >60 >60 mL/min   Anion gap 9 5 - 15    Comment: Performed at [facility] [place] [address] [place], [place] [address]  Heparin level (unfractionated)     Status: Abnormal   Collection Time: 01/28/19  5:18 AM  Result Value Ref Range   Heparin Unfractionated 0.27 (L) 0.30 - 0.70 IU/mL    Comment: (NOTE) If heparin results are below expected values, and patient dosage has  been confirmed, suggest follow up testing of antithrombin III levels. Performed at [facility], [place] [address], [place], [place] [address]   CBC     Status: Abnormal   Collection Time: 01/29/19  5:35 AM  Result Value Ref Range   WBC 6.9 4.0 - 10.5 K/uL   RBC 3.84 (L) 4.22 - 5.81 MIL/uL   Hemoglobin 10.4 (L) 13.0 - 17.0 g/dL   HCT 33.0 (L) 39.0 - 52.0 %   MCV 85.9 80.0 - 100.0 fL   MCH 27.1 26.0 - 34.0 pg   MCHC 31.5 30.0 - 36.0 g/dL   RDW 15.0 11.5 - 15.5 %   Platelets 490 (H) 150 - 400 K/uL   nRBC 0.0 0.0 - 0.2 %    Comment: Performed at [facility], [place] [address], [place], [place] [address]  CBC with Differential/Platelet     Status: Abnormal   Collection Time: 02/12/19  7:25 AM  Result Value Ref Range   WBC 3.1 (L) 4.0 - 10.5 K/uL   RBC 4.39 4.22 - 5.81 MIL/uL   Hemoglobin 11.9 (L) 13.0 - 17.0 g/dL   HCT 37.0 (L) 39.0 - 52.0 %   MCV 84.3 80.0 - 100.0 fL   MCH 27.1 26.0 - 34.0 pg   MCHC 32.2 30.0 - 36.0 g/dL   RDW 15.5 11.5 - 15.5 %   Platelets 381 150 - 400 K/uL  nRBC 0.0 0.0 - 0.2 %   Neutrophils Relative % 68 %   Neutro Abs 2.1 1.7 - 7.7 K/uL   Lymphocytes Relative  10 %   Lymphs Abs 0.3 (L) 0.7 - 4.0 K/uL   Monocytes Relative 20 %   Monocytes Absolute 0.6 0.1 - 1.0 K/uL   Eosinophils Relative 1 %   Eosinophils Absolute 0.0 0.0 - 0.5 K/uL   Basophils Relative 1 %   Basophils Absolute 0.0 0.0 - 0.1 K/uL   Immature Granulocytes 0 %   Abs Immature Granulocytes 0.01 0.00 - 0.07 K/uL    Comment: Performed at [facility], [place] [address], [place], [place] [address]  CMP ([place] only)     Status: Abnormal   Collection Time: 02/12/19  7:25 AM  Result Value Ref Range   Sodium 139 135 - 145 mmol/L   Potassium 3.8 3.5 - 5.1 mmol/L   Chloride 103 98 - 111 mmol/L   CO2 25 22 - 32 mmol/L   Glucose, Bld 117 (H) 70 - 99 mg/dL   BUN 10 6 - 20 mg/dL   Creatinine 0.84 0.61 - 1.24 mg/dL   Calcium 9.4 8.9 - 10.3 mg/dL   Total Protein 7.1 6.5 - 8.1 g/dL   Albumin 3.1 (L) 3.5 - 5.0 g/dL   AST 17 15 - 41 U/L   ALT 22 0 - 44 U/L   Alkaline Phosphatase 140 (H) 38 - 126 U/L   Total Bilirubin <0.2 (L) 0.3 - 1.2 mg/dL   GFR, Est Non Af Am >60 >60 mL/min   GFR, Est AFR Am >60 >60 mL/min   Anion gap 11 5 - 15    Comment: Performed at [facility], [place] [address], [place], [place] [address]  Magnesium     Status: None   Collection Time: 02/12/19  7:25 AM  Result Value Ref Range   Magnesium 2.0 1.7 - 2.4 mg/dL    Comment: Performed at [facility], [place] [address] [place], [place] [address]  CBC with Differential/Platelet     Status: Abnormal   Collection Time: 02/19/19  8:58 AM  Result Value Ref Range   WBC 5.0 4.0 - 10.5 K/uL   RBC 4.26 4.22 - 5.81 MIL/uL   Hemoglobin 11.5 (L) 13.0 - 17.0 g/dL   HCT 35.8 (L) 39.0 - 52.0 %   MCV 84.0 80.0 - 100.0 fL   MCH 27.0 26.0 - 34.0 pg   MCHC 32.1 30.0 - 36.0 g/dL   RDW 15.8 (H) 11.5 - 15.5 %   Platelets 356 150 - 400 K/uL   nRBC 0.0 0.0 - 0.2 %   Neutrophils Relative % 83 %   Neutro Abs 4.1 1.7 - 7.7 K/uL   Lymphocytes Relative 6 %    Lymphs Abs 0.3 (L) 0.7 - 4.0 K/uL   Monocytes Relative 11 %   Monocytes Absolute 0.5 0.1 - 1.0 K/uL   Eosinophils Relative 0 %   Eosinophils Absolute 0.0 0.0 - 0.5 K/uL   Basophils Relative 0 %   Basophils Absolute 0.0 0.0 - 0.1 K/uL   Immature Granulocytes 0 %   Abs Immature Granulocytes 0.02 0.00 - 0.07 K/uL    Comment: Performed at [facility], [place] [address], [place], [place] [address]  CMP ([place] only)     Status: Abnormal   Collection Time: 02/19/19  8:58 AM  Result Value Ref Range   Sodium 139  135 - 145 mmol/L   Potassium 4.0 3.5 - 5.1 mmol/L   Chloride 103 98 - 111 mmol/L   CO2 26 22 - 32 mmol/L   Glucose, Bld 134 (H) 70 - 99 mg/dL   BUN 8 6 - 20 mg/dL   Creatinine 0.80 0.61 - 1.24 mg/dL   Calcium 9.4 8.9 - 10.3 mg/dL   Total Protein 6.9 6.5 - 8.1 g/dL   Albumin 3.3 (L) 3.5 - 5.0 g/dL   AST 20 15 - 41 U/L   ALT 39 0 - 44 U/L   Alkaline Phosphatase 116 38 - 126 U/L   Total Bilirubin <0.2 (L) 0.3 - 1.2 mg/dL   GFR, Est Non Af Am >60 >60 mL/min   GFR, Est AFR Am >60 >60 mL/min   Anion gap 10 5 - 15    Comment: Performed at [facility], [place] [address] [place], [place] [address]  Magnesium     Status: None   Collection Time: 02/19/19  8:58 AM  Result Value Ref Range   Magnesium 1.9 1.7 - 2.4 mg/dL    Comment: Performed at [facility], [place] [address], [place], [place] [address]    Assessment/Plan: Adenocarcinoma of right lung [facility]) -Continue chemo and radiation -Continue follow-up with radiation oncology, Dr. Tyler Pita  Internal jugular (IJ) vein thromboembolism, acute, right (HCC) -Continue Lovenox twice daily with plans to start Eliquis next week. -Given precautions  Carpal tunnel syndrome of right wrist -Discussed various treatment options -Continue wearing wrist splint at night -Continue follow-up with [facility] -Given handout  Impacted cerumen of left ear  -Consent obtained.  Left ear irrigated.  Patient tolerated procedure well. -Given handout  Former smoker -Patient quit smoking cigarettes/cigars on 9/12/202020 when hospitalized -Smoking cessation greater than 3 minutes, less than 10 minutes -Continued cessation encouraged -Given handout -We will continue to monitor  Encounter to establish care -We reviewed the PMH, PSH, FH, SH, Meds and Allergies. -We provided refills for any medications we will prescribe as needed. -We addressed current concerns per orders and patient instructions. -We have asked for records for pertinent exams, studies, vaccines and notes from previous providers. -We have advised patient to follow up per instructions below.  F/u as needed in the next few weeks for CPE  Grier Mitts, MD

## 2019-02-22 NOTE — Patient Instructions (Signed)
Carpal Tunnel Syndrome  Carpal tunnel syndrome is a condition that causes pain in your hand and arm. The carpal tunnel is a narrow area that is on the palm side of your wrist. Repeated wrist motion or certain diseases may cause swelling in the tunnel. This swelling can pinch the main nerve in the wrist (median nerve). What are the causes? This condition may be caused by:  Repeated wrist motions.  Wrist injuries.  Arthritis.  A sac of fluid (cyst) or abnormal growth (tumor) in the carpal tunnel.  Fluid buildup during pregnancy. Sometimes the cause is not known. What increases the risk? The following factors may make you more likely to develop this condition:  Having a job in which you move your wrist in the same way many times. This includes jobs like being a Software engineer or a Scientist, water quality.  Being a woman.  Having other health conditions, such as: ? Diabetes. ? Obesity. ? A thyroid gland that is not active enough (hypothyroidism). ? Kidney failure. What are the signs or symptoms? Symptoms of this condition include:  A tingling feeling in your fingers.  Tingling or a loss of feeling (numbness) in your hand.  Pain in your entire arm. This pain may get worse when you bend your wrist and elbow for a long time.  Pain in your wrist that goes up your arm to your shoulder.  Pain that goes down into your palm or fingers.  A weak feeling in your hands. You may find it hard to grab and hold items. You may feel worse at night. How is this diagnosed? This condition is diagnosed with a medical history and physical exam. You may also have tests, such as:  Electromyogram (EMG). This test checks the signals that the nerves send to the muscles.  Nerve conduction study. This test checks how well signals pass through your nerves.  Imaging tests, such as X-rays, ultrasound, and MRI. These tests check for what might be the cause of your condition. How is this treated? This condition may be treated  with:  Lifestyle changes. You will be asked to stop or change the activity that caused your problem.  Doing exercise and activities that make bones and muscles stronger (physical therapy).  Learning how to use your hand again (occupational therapy).  Medicines for pain and swelling (inflammation). You may have injections in your wrist.  A wrist splint.  Surgery. Follow these instructions at home: If you have a splint:  Wear the splint as told by your doctor. Remove it only as told by your doctor.  Loosen the splint if your fingers: ? Tingle. ? Lose feeling (become numb). ? Turn cold and blue.  Keep the splint clean.  If the splint is not waterproof: ? Do not let it get wet. ? Cover it with a watertight covering when you take a bath or a shower. Managing pain, stiffness, and swelling   If told, put ice on the painful area: ? If you have a removable splint, remove it as told by your doctor. ? Put ice in a plastic bag. ? Place a towel between your skin and the bag. ? Leave the ice on for 20 minutes, 2-3 times per day. General instructions  Take over-the-counter and prescription medicines only as told by your doctor.  Rest your wrist from any activity that may cause pain. If needed, talk with your boss at work about changes that can help your wrist heal.  Do any exercises as told by your doctor,  physical therapist, or occupational therapist.  Keep all follow-up visits as told by your doctor. This is important. Contact a doctor if:  You have new symptoms.  Medicine does not help your pain.  Your symptoms get worse. Get help right away if:  You have very bad numbness or tingling in your wrist or hand. Summary  Carpal tunnel syndrome is a condition that causes pain in your hand and arm.  It is often caused by repeated wrist motions.  Lifestyle changes and medicines are used to treat this problem. Surgery may help in very bad cases.  Follow your doctor's  instructions about wearing a splint, resting your wrist, keeping follow-up visits, and calling for help. This information is not intended to replace advice given to you by your health care provider. Make sure you discuss any questions you have with your health care provider. Document Released: 04/15/2011 Document Revised: 09/02/2017 Document Reviewed: 09/02/2017 Elsevier Patient Education  [place], Adult The ears produce a substance called earwax that helps keep bacteria out of the ear and protects the skin in the ear canal. Occasionally, earwax can build up in the ear and cause discomfort or hearing loss. What increases the risk? This condition is more likely to develop in people who:  Are male.  Are elderly.  Naturally produce more earwax.  Clean their ears often with cotton swabs.  Use earplugs often.  Use in-ear headphones often.  Wear hearing aids.  Have narrow ear canals.  Have earwax that is overly thick or sticky.  Have eczema.  Are dehydrated.  Have excess hair in the ear canal. What are the signs or symptoms? Symptoms of this condition include:  Reduced or muffled hearing.  A feeling of fullness in the ear or feeling that the ear is plugged.  Fluid coming from the ear.  Ear pain.  Ear itch.  Ringing in the ear.  Coughing.  An obvious piece of earwax that can be seen inside the ear canal. How is this diagnosed? This condition may be diagnosed based on:  Your symptoms.  Your medical history.  An ear exam. During the exam, your health care provider will look into your ear with an instrument called an otoscope. You may have tests, including a hearing test. How is this treated? This condition may be treated by:  Using ear drops to soften the earwax.  Having the earwax removed by a health care provider. The health care provider may: ? Flush the ear with water. ? Use an instrument that has a loop on the end (curette).  ? Use a suction device.  Surgery to remove the wax buildup. This may be done in severe cases. Follow these instructions at home:   Take over-the-counter and prescription medicines only as told by your health care provider.  Do not put any objects, including cotton swabs, into your ear. You can clean the opening of your ear canal with a washcloth or facial tissue.  Follow instructions from your health care provider about cleaning your ears. Do not over-clean your ears.  Drink enough fluid to keep your urine clear or pale yellow. This will help to thin the earwax.  Keep all follow-up visits as told by your health care provider. If earwax builds up in your ears often or if you use hearing aids, consider seeing your health care provider for routine, preventive ear cleanings. Ask your health care provider how often you should schedule your cleanings.  If you have hearing  aids, clean them according to instructions from the manufacturer and your health care provider. Contact a health care provider if:  You have ear pain.  You develop a fever.  You have blood, pus, or other fluid coming from your ear.  You have hearing loss.  You have ringing in your ears that does not go away.  Your symptoms do not improve with treatment.  You feel like the room is spinning (vertigo). Summary  Earwax can build up in the ear and cause discomfort or hearing loss.  The most common symptoms of this condition include reduced or muffled hearing and a feeling of fullness in the ear or feeling that the ear is plugged.  This condition may be diagnosed based on your symptoms, your medical history, and an ear exam.  This condition may be treated by using ear drops to soften the earwax or by having the earwax removed by a health care provider.  Do not put any objects, including cotton swabs, into your ear. You can clean the opening of your ear canal with a washcloth or facial tissue. This information is not  intended to replace advice given to you by your health care provider. Make sure you discuss any questions you have with your health care provider. Document Released: 06/03/2004 Document Revised: 04/08/2017 Document Reviewed: 07/07/2016 Elsevier Patient Education  2020 [employer].  Venous Thromboembolism Prevention Venous thromboembolism (VTE) is a condition in which a blood clot (thrombus) develops in the body. A deep vein thrombosis (DVT) is a blood clot that usually occurs in a deep vein in the leg or the pelvis, but it can also occur in the arm. Sometimes, pieces of a thrombus can break off and travel through the bloodstream to other parts of the body. When that happens, the thrombus is called an embolus. An embolus that travels to the lungs is called a pulmonary embolism. An embolism can block the blood flow in the blood vessels of other organs as well. How can this condition affect me? VTE is a serious health condition that can cause disability or death. It is very important to get help right away. Do not ignore your symptoms. What can increase my risk? You are more likely to develop this condition if you:  Have had recent major surgery or trauma.  Have certain health conditions, such as cancer.  Are in the hospital for a sudden illness.  Have not moved for a long period of time, such as if you are on bed rest or traveling a long distance.  Take certain medicines, such as birth control pills or hormone replacement therapy.  Are pregnant or recently gave birth.  Have a personal or family history of VTE.  Are overweight.  Are 60 years of age or older.  Use products that contain nicotine and tobacco. What actions can I take to prevent this? In the hospital A VTE may be prevented by taking medicines that are prescribed to prevent blood clots (anticoagulants). You can also help to prevent VTE while in the hospital by taking these actions:  Get out of bed and walk. Ask your  health care provider if this is safe for you to do.  Ask your health care provider if you should use a sequential compression device (SCD). This is a machine that pumps air into compression sleeves that are wrapped around your legs.  Ask your health care provider if you should wear tight, elastic stockings that apply pressure to the lower legs (compression stockings). Compression  stockings are sometimes used with SCDs. At home after surgery Understand that you have an increased risk for VTE for the first 4-6 weeks after surgery. During this time:  Avoid traveling for more than 4 hours. If you must travel after surgery, ask your health care provider about additional preventive actions that you can take.  Avoid sitting or lying still for too long. If possible, get up and walk around one time every hour. Ask your health care provider when this is safe for you to do. While traveling Travel that takes more than 4 hours can increase the risk of a VTE. To prevent VTE when traveling:  Exercise your arms and legs every hour. You can do this by standing, stretching, and bending and straightening your arms and legs. If you are traveling by airplane, train, or bus, walk up and down the aisle as often as possible to get your blood moving. If you are traveling by car, stop and get out of the car every hour to exercise your arms and legs and stretch. Other types of exercise might include: ? Keeping your feet flat on the ground and raising your toes. ? Switching from tightening the muscles in your calves and thighs to relaxing those same muscles while you are sitting. ? Pointing and flexing your feet at the ankle joints while you are sitting.  Drink enough water to keep your urine pale yellow.  Avoid drinking alcohol during long travel. Generally, it is not recommended that you take medicines to prevent DVT during routine travel. Activity   Stay active. Avoid sitting or lying in bed for long periods of  time without moving your arms and legs.  Exercise by moving your arms and legs for 30 minutes or more every day.  Try not to bump or injure your legs.  Avoid crossing your legs when you are sitting. Lifestyle  Do not use any products that contain nicotine or tobacco, such as cigarettes, e-cigarettes, and chewing tobacco. If you need help quitting, ask your health care provider. This is especially important if you take estrogen medicines.  Avoid wearing tight clothing around your legs or waist. General information   Wear compression stockings as told by your health care provider. These stockings help to prevent blood clots and reduce swelling in your legs. Do not let them bunch up when you are wearing them.  Avoid using medicines that contain estrogen if you do not need them. These include birth control pills and hormone replacement therapy.  Do not use pillows under your knees while lying down unless told by your health care provider.  Maintain a healthy weight. Where to find more information  Centers for Disease Control and Prevention: http://www.wolf.info/  American Heart Association: www.heart.org Get help right away if:  You have chest pain or shortness of breath.  You cough up blood.  You have a rapid or irregular heartbeat.  You feel light-headed or dizzy.  You have new or increased pain, swelling, or redness in an arm or leg.  You have numbness or tingling in an arm or leg.  You have blood in your vomit, stool, or urine. These symptoms may represent a serious problem that is an emergency. Do not wait to see if the symptoms will go away. Get medical help right away. Call your local emergency services (911 in the [place] Do not drive yourself to the hospital. Summary  Venous thromboembolism (VTE) is a condition in which a blood clot (thrombus)develops in the body. A deep  vein thrombosis (DVT) is a blood clot that usually occurs in a deep vein in the leg or the pelvis, but it  can also occur in the arm.  VTE is a serious health condition that can cause disability or death. It is very important to get help right away. Do not ignore your symptoms.  If you are traveling, exercise your arms and legs every hour.  Stay active to help prevent blood clots. Avoid sitting or lying in bed for long periods of time without moving your arms and legs. This information is not intended to replace advice given to you by your health care provider. Make sure you discuss any questions you have with your health care provider. Document Released: 04/14/2009 Document Revised: 08/15/2018 Document Reviewed: 07/04/2018 Elsevier Patient Education  2020 [employer].

## 2019-02-23 ENCOUNTER — Ambulatory Visit
Admission: RE | Admit: 2019-02-23 | Discharge: 2019-02-23 | Disposition: A | Discharge status: Home / Self Care | Payer: Medicaid Other | Source: Ambulatory Visit | Attending: Radiation Oncology | Admitting: Radiation Oncology

## 2019-02-23 ENCOUNTER — Other Ambulatory Visit: Payer: Self-pay

## 2019-02-23 ENCOUNTER — Other Ambulatory Visit: Payer: Self-pay | Admitting: Radiation Oncology

## 2019-02-23 DIAGNOSIS — Z510 Encounter for antineoplastic radiation therapy: Secondary | ICD-10-CM | POA: Diagnosis not present

## 2019-02-23 DIAGNOSIS — C3411 Malignant neoplasm of upper lobe, right bronchus or lung: Secondary | ICD-10-CM | POA: Diagnosis not present

## 2019-02-23 DIAGNOSIS — C3491 Malignant neoplasm of unspecified part of right bronchus or lung: Secondary | ICD-10-CM | POA: Diagnosis not present

## 2019-02-23 DIAGNOSIS — I871 Compression of vein: Secondary | ICD-10-CM | POA: Diagnosis not present

## 2019-02-26 ENCOUNTER — Ambulatory Visit
Admission: RE | Admit: 2019-02-26 | Discharge: 2019-02-26 | Disposition: A | Discharge status: Home / Self Care | Payer: Medicaid Other | Source: Ambulatory Visit | Attending: Radiation Oncology | Admitting: Radiation Oncology

## 2019-02-26 ENCOUNTER — Inpatient Hospital Stay: Payer: Medicaid Other | Admitting: Nutrition

## 2019-02-26 ENCOUNTER — Inpatient Hospital Stay (HOSPITAL_BASED_OUTPATIENT_CLINIC_OR_DEPARTMENT_OTHER): Payer: Medicaid Other | Admitting: Hematology

## 2019-02-26 ENCOUNTER — Encounter: Payer: Self-pay | Admitting: Family Medicine

## 2019-02-26 ENCOUNTER — Other Ambulatory Visit: Payer: Self-pay

## 2019-02-26 ENCOUNTER — Inpatient Hospital Stay: Payer: Medicaid Other

## 2019-02-26 VITALS — BP 98/64 | HR 104 | Temp 97.8°F | Resp 18 | Ht 71.0 in | Wt 123.3 lb

## 2019-02-26 VITALS — HR 92

## 2019-02-26 DIAGNOSIS — I82621 Acute embolism and thrombosis of deep veins of right upper extremity: Secondary | ICD-10-CM

## 2019-02-26 DIAGNOSIS — G5601 Carpal tunnel syndrome, right upper limb: Secondary | ICD-10-CM | POA: Insufficient documentation

## 2019-02-26 DIAGNOSIS — Z7189 Other specified counseling: Secondary | ICD-10-CM

## 2019-02-26 DIAGNOSIS — C3491 Malignant neoplasm of unspecified part of right bronchus or lung: Secondary | ICD-10-CM

## 2019-02-26 DIAGNOSIS — Z5111 Encounter for antineoplastic chemotherapy: Secondary | ICD-10-CM | POA: Diagnosis not present

## 2019-02-26 DIAGNOSIS — Z510 Encounter for antineoplastic radiation therapy: Secondary | ICD-10-CM | POA: Diagnosis not present

## 2019-02-26 DIAGNOSIS — I871 Compression of vein: Secondary | ICD-10-CM | POA: Diagnosis not present

## 2019-02-26 DIAGNOSIS — C3411 Malignant neoplasm of upper lobe, right bronchus or lung: Secondary | ICD-10-CM | POA: Diagnosis not present

## 2019-02-26 DIAGNOSIS — Z87891 Personal history of nicotine dependence: Secondary | ICD-10-CM | POA: Insufficient documentation

## 2019-02-26 DIAGNOSIS — G893 Neoplasm related pain (acute) (chronic): Secondary | ICD-10-CM

## 2019-02-26 LAB — CBC WITH DIFFERENTIAL/PLATELET
Abs Immature Granulocytes: 0.03 10*3/uL (ref 0.00–0.07)
Basophils Absolute: 0 10*3/uL (ref 0.0–0.1)
Basophils Relative: 0 %
Eosinophils Absolute: 0 10*3/uL (ref 0.0–0.5)
Eosinophils Relative: 1 %
HCT: 37.3 % — ABNORMAL LOW (ref 39.0–52.0)
Hemoglobin: 12.2 g/dL — ABNORMAL LOW (ref 13.0–17.0)
Immature Granulocytes: 1 %
Lymphocytes Relative: 4 %
Lymphs Abs: 0.2 10*3/uL — ABNORMAL LOW (ref 0.7–4.0)
MCH: 27.9 pg (ref 26.0–34.0)
MCHC: 32.7 g/dL (ref 30.0–36.0)
MCV: 85.2 fL (ref 80.0–100.0)
Monocytes Absolute: 0.8 10*3/uL (ref 0.1–1.0)
Monocytes Relative: 15 %
Neutro Abs: 4.5 10*3/uL (ref 1.7–7.7)
Neutrophils Relative %: 79 %
Platelets: 285 10*3/uL (ref 150–400)
RBC: 4.38 MIL/uL (ref 4.22–5.81)
RDW: 16.8 % — ABNORMAL HIGH (ref 11.5–15.5)
WBC: 5.6 10*3/uL (ref 4.0–10.5)
nRBC: 0 % (ref 0.0–0.2)

## 2019-02-26 LAB — CMP (CANCER CENTER ONLY)
ALT: 24 U/L (ref 0–44)
AST: 13 U/L — ABNORMAL LOW (ref 15–41)
Albumin: 3.4 g/dL — ABNORMAL LOW (ref 3.5–5.0)
Alkaline Phosphatase: 108 U/L (ref 38–126)
Anion gap: 11 (ref 5–15)
BUN: 9 mg/dL (ref 6–20)
CO2: 25 mmol/L (ref 22–32)
Calcium: 9.5 mg/dL (ref 8.9–10.3)
Chloride: 102 mmol/L (ref 98–111)
Creatinine: 0.81 mg/dL (ref 0.61–1.24)
GFR, Est AFR Am: >60 mL/min (ref >60)
GFR, Estimated: >60 mL/min (ref >60)
Glucose, Bld: 107 mg/dL — ABNORMAL HIGH (ref 70–99)
Potassium: 3.7 mmol/L (ref 3.5–5.1)
Sodium: 138 mmol/L (ref 135–145)
Total Bilirubin: 0.2 mg/dL — ABNORMAL LOW (ref 0.3–1.2)
Total Protein: 6.9 g/dL (ref 6.5–8.1)

## 2019-02-26 LAB — MAGNESIUM: Magnesium: 1.8 mg/dL (ref 1.7–2.4)

## 2019-02-26 MED ORDER — SODIUM CHLORIDE 0.9 % IV SOLN
Freq: Once | INTRAVENOUS | Status: AC
  Administered 2019-02-26: 11:00:00 via INTRAVENOUS
  Filled 2019-02-26: qty 250

## 2019-02-26 MED ORDER — SODIUM CHLORIDE 0.9 % IV SOLN
20.0000 mg | Freq: Once | INTRAVENOUS | Status: AC
  Administered 2019-02-26: 11:00:00 20 mg via INTRAVENOUS
  Filled 2019-02-26: qty 2

## 2019-02-26 MED ORDER — FAMOTIDINE IN NACL 20-0.9 MG/50ML-% IV SOLN
20.0000 mg | Freq: Once | INTRAVENOUS | Status: AC
  Administered 2019-02-26: 11:00:00 20 mg via INTRAVENOUS

## 2019-02-26 MED ORDER — DIPHENHYDRAMINE HCL 50 MG/ML IJ SOLN
INTRAMUSCULAR | Status: AC
  Filled 2019-02-26: qty 1

## 2019-02-26 MED ORDER — SODIUM CHLORIDE 0.9 % IV SOLN
45.0000 mg/m2 | Freq: Once | INTRAVENOUS | Status: AC
  Administered 2019-02-26: 78 mg via INTRAVENOUS
  Filled 2019-02-26: qty 13

## 2019-02-26 MED ORDER — FAMOTIDINE IN NACL 20-0.9 MG/50ML-% IV SOLN
INTRAVENOUS | Status: AC
  Filled 2019-02-26: qty 50

## 2019-02-26 MED ORDER — PALONOSETRON HCL INJECTION 0.25 MG/5ML
INTRAVENOUS | Status: AC
  Filled 2019-02-26: qty 5

## 2019-02-26 MED ORDER — DIPHENHYDRAMINE HCL 50 MG/ML IJ SOLN
50.0000 mg | Freq: Once | INTRAMUSCULAR | Status: AC
  Administered 2019-02-26: 11:00:00 50 mg via INTRAVENOUS

## 2019-02-26 MED ORDER — SODIUM CHLORIDE 0.9 % IV SOLN
224.4000 mg | Freq: Once | INTRAVENOUS | Status: AC
  Administered 2019-02-26: 14:00:00 220 mg via INTRAVENOUS
  Filled 2019-02-26: qty 22

## 2019-02-26 MED ORDER — PALONOSETRON HCL INJECTION 0.25 MG/5ML
0.2500 mg | Freq: Once | INTRAVENOUS | Status: AC
  Administered 2019-02-26: 11:00:00 0.25 mg via INTRAVENOUS

## 2019-02-26 NOTE — Patient Instructions (Signed)
   [facility] Discharge Instructions for Patients Receiving Chemotherapy  Today you received the following chemotherapy agents Taxol and Carboplatin   To help prevent nausea and vomiting after your treatment, we encourage you to take your nausea medication as directed.    If you develop nausea and vomiting that is not controlled by your nausea medication, call the clinic.   BELOW ARE SYMPTOMS THAT SHOULD BE REPORTED IMMEDIATELY:  *FEVER GREATER THAN 100.5 F  *CHILLS WITH OR WITHOUT FEVER  NAUSEA AND VOMITING THAT IS NOT CONTROLLED WITH YOUR NAUSEA MEDICATION  *UNUSUAL SHORTNESS OF BREATH  *UNUSUAL BRUISING OR BLEEDING  TENDERNESS IN MOUTH AND THROAT WITH OR WITHOUT PRESENCE OF ULCERS  *URINARY PROBLEMS  *BOWEL PROBLEMS  UNUSUAL RASH Items with * indicate a potential emergency and should be followed up as soon as possible.  Feel free to call the clinic should you have any questions or concerns. The clinic phone number is [phone].  Please show the CHEMO ALERT CARD at check-in to the Emergency Department and triage nurse.   

## 2019-02-26 NOTE — Progress Notes (Signed)
Nutrition follow-up completed with patient during infusion for lung cancer. Weight decreased was documented as 123.3 pounds October 19. Patient continues to report difficulty and painful swallowing. He has tried Producer, television/film/video protein and will drink boost plus but no Ensure.  Nutrition diagnosis: Unintended weight loss continues.  Intervention: Educated patient to continue strategies for increased calories and protein in small frequent meals and snacks. Increase Ensure Enlive twice daily between meals. Provided samples and coupons. Reviewed specific high-calorie, high-protein foods. Questions were answered.  Teach back method used.  Monitoring, evaluation, goals: Patient will tolerate increased calories and protein to minimize weight loss.  Next visit: To be scheduled as needed.  **Disclaimer: This note was dictated with voice recognition software. Similar sounding words can inadvertently be transcribed and this note may contain transcription errors which may not have been corrected upon publication of note.**

## 2019-02-26 NOTE — Progress Notes (Signed)
HEMATOLOGY/ONCOLOGY CONSULTATION NOTE  Date of Service: 02/26/2019  Patient Care Team: [name], MD as PCP - General (Family Medicine)  CHIEF COMPLAINTS/PURPOSE OF CONSULTATION:  Newly diagnosed Metastatic Poorly differentiated lung adenocarcinoma  HISTORY OF PRESENTING ILLNESS:   [name] is a 53 year old male with no significant medical history.  He presented to the hospital with progressive right arm and chest pain with new right-sided neck swelling.  The patient states that he was diagnosed with carpal tunnel syndrome received an injection in August.  However, he continued to have progressive pain up and down his right arm that would also radiate to his right chest.  He took ibuprofen with no improvement.  1 week prior to admission, he developed new right sided neck swelling.  He presented to the emergency room for further evaluation.  Work-up in the emergency room was significant for a CT angiogram of the neck and chest which revealed a bulky soft tissue mass in the right neck continuing into the upper chest most compatible with extensive metastatic lymphadenopathy.  This mass measures up to 34 mm and encases multiple vessels including the right upper lobe pulmonary vessels, superior vena cava, and brachiocephalic vessel.  He also had a large poorly defined malignant appearing infiltrative mass measuring 6.2 x 5.6 x 7.9 cm within the right anterior and middle mediastinum with suspected invasion of the distal left brachiocephalic vein and probable tumor occlusion of the right subclavian and jugular veins.  There is also suspected tumor invasion of the upper SVC with string-like narrowing of the SVC but with patency of the SVC just above the right atrium.  The tumor abuts the aorta and great vessels and also results in significant narrowing of the right upper lobe pulmonary arterial vessels.  He also had a CT of the abdomen pelvis which showed a 15 mm heterogeneously enhancing lesion  in the upper pole of the right kidney concerning for renal cell carcinoma, no lymphadenopathy in the abdomen or pelvis, focal hyperenhancement of the anterior left liver most likely related to aberrant venous anatomy/flow, sclerotic lesions in the right acetabulum and left iliac bone are indeterminate.  These are likely bone islands but attention on follow-up is recommended a metastatic disease cannot be excluded.  MRI of the brain with and without contrast did not show any evidence of intracranial metastatic disease. The patient underwent a CT-guided biopsy in interventional radiology on 01/22/2019.  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   When seen today, patient reports ongoing pain and swelling to his right arm and right side of his neck.  Reports ongoing pain to his right chest.  He reports intermittent facial swelling which is worse in the morning and improves throughout the day.  He reports anorexia and a weight loss of 20 pounds over the past few weeks. He has had dizziness for the past few months.  Denies headaches.  He also noted that on the day of admission his peripheral vision was poor.  His vision is now improved.  He denies food getting stuck or difficulty swallowing but does feel as though his throat is "tight."  He reports a nonproductive cough.  Denies fevers and chills.  He is not really having any shortness of breath.  Denies abdominal pain, nausea, vomiting, constipation, diarrhea.  He has not noticed any epistaxis, hemoptysis, hematemesis, hematuria, melena, hematochezia.  The patient is single.  He has 1 daughter who lives in  [place], [place]  Reports occasional alcohol use and currently smokes 3 cigars a day since 1997.  He is currently living in a rooming house with other roommates who all have their own room.  They share a common bathroom and kitchen.  Medical oncology was asked see the  patient to make recommendations regarding his newly diagnosed poorly differentiated carcinoma.  History reviewed. No pertinent past medical history.  INTERVAL HISTORY:   David Berry. is a wonderful 53 y.o. male who is here for evaluation and management of newly diagnosed metastatic poorly differentiated lung adenocarcinoma. He is here for C3D1 of Carbopaltin & Taxol. The patient's last visit with [place] was on 02/19/2019. The pt reports that he is doing well overall.  The pt reports that his throat is still sore. Pt has not began to use his Fentanyl patch yet as he wanted to make sure that he was using it properly. He is now comfortable using his patch. Pt has been using the magic mouthwash and notes that it does numb his throat some but not enough to stop the pain. He is concerned with his prognosis at this time. Pt does note some random chest pains that he attributes to his RT. The tingling/numbess in his hands have since resolved. He is currently using 2 Boost supplements per day and is not using it as a meal replacement. Pt is currently taking 1 Oxycodone per day, typically before he lays down as he has issues with phelgm that gets caught in his throat. Pt has continued to take his Lovenox shots but will switch to his Eliquis as prescribed tomorrow. He is still having issues with hemorrhoidal bleeding.   Lab results today (02/26/19) of CBC w/diff and CMP is as follows: all values are WNL except for Hgb at 12.2, HCT at 37.3, RDW at 16.8, Lymphs Abs at 0.2K, Glucose at 107, Albumin at 3.4, AST at 13, Total Bilirubin at 0.2. 02/26/2019 Magnesium at 1.8  On review of systems, pt reports throat soreness/pain, chest pains, bleeding hemorrhoids and denies SOB, numbness/tingling in hands, nausea, constipation, diarrhea, abdominal pain and any other symptoms.    MEDICAL HISTORY:  Past Medical History:  Diagnosis Date   Blood in stool    Cancer ([place])    Chronic bronchitis with emphysema (HCC)     GERD (gastroesophageal reflux disease)     SURGICAL HISTORY: Past Surgical History:  Procedure Laterality Date   APPENDECTOMY     teenager   INGUINAL HERNIA REPAIR Right 2016, 2017   [facility], 2018 [facility]-removed mesh    SOCIAL HISTORY: Social History   Socioeconomic History   Marital status: Single    Spouse name: Not on file   Number of children: 1   Years of education: GED   Highest education level: Not on file  Occupational History    Comment: [employer]  Social Designer, fashion/clothing strain: Not on file   Food insecurity    Worry: Not on file    Inability: Not on file   Transportation needs    Medical: Not on file    Non-medical: Not on file  Tobacco Use   Smoking status: Former Smoker    Types: Cigars   Smokeless tobacco: Never Used   Tobacco comment: smokes 3 black and mild cigars per day  Substance and Sexual Activity   Alcohol use: Not Currently   Drug use: No   Sexual activity: Yes  Lifestyle   Physical activity  Days per week: Not on file    Minutes per session: Not on file   Stress: Not on file  Relationships   Social connections    Talks on phone: Not on file    Gets together: Not on file    Attends religious service: Not on file    Active member of club or organization: Not on file    Attends meetings of clubs or organizations: Not on file    Relationship status: Not on file   Intimate partner violence    Fear of current or ex partner: Not on file    Emotionally abused: Not on file    Physically abused: Not on file    Forced sexual activity: Not on file  Other Topics Concern   Not on file  Social History Narrative   Lives alone   Caffeine- coffee, 20 oz daily, tea occas    FAMILY HISTORY: Family History  Problem Relation Age of Onset   Prostate cancer Father     ALLERGIES:  is allergic to pregabalin.  MEDICATIONS:  Current Outpatient Medications  Medication Sig Dispense Refill    apixaban (ELIQUIS) 5 MG TABS tablet Take 1 tablet (5 mg total) by mouth 2 (two) times daily. 60 tablet 5   dexamethasone (DECADRON) 4 MG tablet Take 2 tablets (8 mg total) by mouth daily. Start the day after chemotherapy for 2 days. 30 tablet 1   fentaNYL (DURAGESIC) 12 MCG/HR Place 1 patch onto the skin every 3 (three) days. (Patient not taking: Reported on 02/22/2019) 10 patch 0   LORazepam (ATIVAN) 0.5 MG tablet Take 1 tablet (0.5 mg total) by mouth every 6 (six) hours as needed (Nausea or vomiting). 30 tablet 0   magic mouthwash w/lidocaine SOLN Take 5 mLs by mouth 4 (four) times daily as needed for mouth pain. Swish in the mouth and swallow for radiation related throat pain 480 mL 1   ondansetron (ZOFRAN) 8 MG tablet Take 1 tablet (8 mg total) by mouth 2 (two) times daily as needed for refractory nausea / vomiting. Start on day 3 after chemo. 30 tablet 1   prochlorperazine (COMPAZINE) 10 MG tablet Take 1 tablet (10 mg total) by mouth every 6 (six) hours as needed (Nausea or vomiting). 30 tablet 1   No current facility-administered medications for this visit.     REVIEW OF SYSTEMS:   A 10+ POINT REVIEW OF SYSTEMS WAS OBTAINED including neurology, dermatology, psychiatry, cardiac, respiratory, lymph, extremities, GI, GU, Musculoskeletal, constitutional, breasts, reproductive, HEENT.  All pertinent positives are noted in the HPI.  All others are negative.   PHYSICAL EXAMINATION: ECOG PERFORMANCE STATUS: 1 - Symptomatic but completely ambulatory  . Vitals:   02/26/19 0944  BP: 98/64  Pulse: (!) 104  Resp: 18  Temp: 97.8 F (36.6 C)  SpO2: 100%   Filed Weights   02/26/19 0944  Weight: 123 lb 4.8 oz (55.9 kg)   .Body mass index is 17.2 kg/m.   GENERAL:alert, in no acute distress and comfortable SKIN: no acute rashes, no significant lesions EYES: conjunctiva are pink and non-injected, sclera anicteric OROPHARYNX: MMM, no exudates, no oropharyngeal erythema or  ulceration NECK: supple, no JVD LYMPH:  no palpable lymphadenopathy in the cervical, axillary or inguinal regions LUNGS: clear to auscultation b/l with normal respiratory effort HEART: regular rate & rhythm ABDOMEN:  normoactive bowel sounds , non tender, not distended. No palpable hepatosplenomegaly.  Extremity: no pedal edema PSYCH: alert & oriented x 3 with fluent speech  NEURO: no focal motor/sensory deficits  LABORATORY DATA:  I have reviewed the data as listed  . CBC Latest Ref Rng & Units 02/19/2019 02/12/2019 01/29/2019  WBC 4.0 - 10.5 K/uL 5.0 3.1(L) 6.9  Hemoglobin 13.0 - 17.0 g/dL 11.5(L) 11.9(L) 10.4(L)  Hematocrit 39.0 - 52.0 % 35.8(L) 37.0(L) 33.0(L)  Platelets 150 - 400 K/uL 356 381 490(H)    . CMP Latest Ref Rng & Units 02/19/2019 02/12/2019 01/27/2019  Glucose 70 - 99 mg/dL 134(H) 117(H) 106(H)  BUN 6 - 20 mg/dL '8 10 9  ' Creatinine 0.61 - 1.24 mg/dL 0.80 0.84 0.67  Sodium 135 - 145 mmol/L 139 139 135  Potassium 3.5 - 5.1 mmol/L 4.0 3.8 3.8  Chloride 98 - 111 mmol/L 103 103 103  CO2 22 - 32 mmol/L '26 25 23  ' Calcium 8.9 - 10.3 mg/dL 9.4 9.4 9.6  Total Protein 6.5 - 8.1 g/dL 6.9 7.1 -  Total Bilirubin 0.3 - 1.2 mg/dL <0.2(L) <0.2(L) -  Alkaline Phos 38 - 126 U/L 116 140(H) -  AST 15 - 41 U/L 20 17 -  ALT 0 - 44 U/L 39 22 -    01/22/2019 [facility]: Tumor Mutational Burden     01/22/2019 PD-L1 Immunohistochemistry Analysis    01/22/2019 Soft Tissue Needle Core Biopsy Surgical Pathology     RADIOGRAPHIC STUDIES: I have personally reviewed the radiological images as listed and agreed with the findings in the report. No results found.  ASSESSMENT & PLAN:   This is a 53 year old malewith  1. 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 Acute DVT- Right IJ, Right Innominate Vein, and likely also the SVC- related to malignancy+ tobacco-  on lovenox  4. Symptomatic hemorrhoids  5. S  PLAN:  -Discussed pt labwork today, 02/26/19;  all values are WNL except for Hgb at 12.2, HCT at 37.3, RDW at 16.8, Lymphs Abs at 0.2K, Glucose at 107, Albumin at 3.4, AST at 13, Total Bilirubin at 0.2. -Discussed 02/26/2019 Magnesium at 1.8 -Advised pt again that he has metastatic poorly differentiated lung adenocarcinoma and that his treatment is palliative, not curative -Continue palliative RT for impending SVC syndrome and abuttment/potential invasion of large mediastinal arteries. Last day of RT is 02/27/2019 -Will hold C4 of Carbopaltin & Taxol scheduled for 03/05/2019 -Advised pt to start Eliquis 65m po BID 2 hours before his scheduled dose of Lovenox -Will hold off on port for as long as possible  -Pt has no prohibitive toxicities from continuing C3D1 of Carbopaltin & Taxol at this time -Will see back in 2 weeks with labs  3. Normocytic anemia -Likely due to underlying malignancy -Will check ferritin, iron studies, vitamin B12 level, and RBC folate in the a.m.  4 Thrombocytosis -- likely due to paraneoplastic effect of tumor and reactive due to inflammation and tissue inflammation from RT.  5. Nicotine dependence -He was counseled about tobacco cessation.   FOLLOW UP: Please cancel chemotherapy/MD visit and labs currently scheduled for 10/26 Plz schedule labs and MD in 2 weeks with Dr KIrene Limbo  The total time spent in the appt was 25 minutes and more than 50% was on counseling and direct patient cares.  All of the patient's questions were answered with apparent satisfaction. The patient  knows to call the clinic with any problems, questions or concerns.   Sullivan Lone MD [place] AAHIVMS [facility] Hematology/Oncology Physician [facility]  (Office):       [phone] (Work cell):  [phone] (Fax):           [phone]  02/26/2019 2:51 AM  I, Yevette Edwards, am acting as a scribe for Dr. Sullivan Lone.   .I have reviewed the above documentation for accuracy and completeness, and I agree with the above. Brunetta Genera MD

## 2019-02-27 ENCOUNTER — Telehealth: Payer: Self-pay | Admitting: *Deleted

## 2019-02-27 ENCOUNTER — Telehealth: Payer: Self-pay | Admitting: Hematology

## 2019-02-27 ENCOUNTER — Encounter: Payer: Self-pay | Admitting: Radiation Oncology

## 2019-02-27 ENCOUNTER — Other Ambulatory Visit: Payer: Self-pay

## 2019-02-27 ENCOUNTER — Ambulatory Visit
Admission: RE | Admit: 2019-02-27 | Discharge: 2019-02-27 | Disposition: A | Discharge status: Home / Self Care | Payer: Medicaid Other | Source: Ambulatory Visit | Attending: Radiation Oncology | Admitting: Radiation Oncology

## 2019-02-27 DIAGNOSIS — C3411 Malignant neoplasm of upper lobe, right bronchus or lung: Secondary | ICD-10-CM | POA: Diagnosis not present

## 2019-02-27 DIAGNOSIS — I871 Compression of vein: Secondary | ICD-10-CM | POA: Diagnosis not present

## 2019-02-27 DIAGNOSIS — C3491 Malignant neoplasm of unspecified part of right bronchus or lung: Secondary | ICD-10-CM | POA: Diagnosis not present

## 2019-02-27 DIAGNOSIS — Z510 Encounter for antineoplastic radiation therapy: Secondary | ICD-10-CM | POA: Diagnosis not present

## 2019-02-27 MED FILL — ELIQUIS 5 MG TABLET: 5 | 30 days supply | Qty: 60 | Fill #0

## 2019-02-27 NOTE — Telephone Encounter (Signed)
Scheduled appt per 10/19 los.  Spoke with pt and he is aware of his appt date and time.

## 2019-02-27 NOTE — Telephone Encounter (Signed)
Contacted by Satira Mccallum S.: While speaking with scheduler, he stated that his patch had popped off and had replaced it....and he said he had read the instructions and it said it was okay...he said he feels fine... Contacted patient - he verified what was reported. He stated he followed the directions that came with the patch and threw away the patch that fell off and replaced it. He states he feels better on Fentanyl than when he was taking pills.

## 2019-02-28 ENCOUNTER — Ambulatory Visit: Payer: Medicaid Other

## 2019-03-01 ENCOUNTER — Ambulatory Visit: Payer: Medicaid Other

## 2019-03-02 ENCOUNTER — Ambulatory Visit: Payer: Medicaid Other

## 2019-03-05 ENCOUNTER — Other Ambulatory Visit: Payer: Self-pay

## 2019-03-05 ENCOUNTER — Encounter: Payer: Self-pay | Admitting: Nutrition

## 2019-03-05 ENCOUNTER — Ambulatory Visit: Payer: Medicaid Other

## 2019-03-05 ENCOUNTER — Telehealth: Payer: Self-pay | Admitting: *Deleted

## 2019-03-05 ENCOUNTER — Ambulatory Visit: Payer: Self-pay | Admitting: Hematology

## 2019-03-05 ENCOUNTER — Ambulatory Visit: Payer: Self-pay

## 2019-03-05 NOTE — Progress Notes (Signed)
HEMATOLOGY/ONCOLOGY CONSULTATION NOTE  Date of Service: 03/06/2019  Patient Care Team: [name], MD as PCP - General (Family Medicine)  CHIEF COMPLAINTS/PURPOSE OF CONSULTATION:  Newly diagnosed Metastatic Poorly differentiated lung adenocarcinoma  HISTORY OF PRESENTING ILLNESS:   David Berry is a 53 year old male with no significant medical history.  He presented to the hospital with progressive right arm and chest pain with new right-sided neck swelling.  The patient states that he was diagnosed with carpal tunnel syndrome received an injection in August.  However, he continued to have progressive pain up and down his right arm that would also radiate to his right chest.  He took ibuprofen with no improvement.  1 week prior to admission, he developed new right sided neck swelling.  He presented to the emergency room for further evaluation.  Work-up in the emergency room was significant for a CT angiogram of the neck and chest which revealed a bulky soft tissue mass in the right neck continuing into the upper chest most compatible with extensive metastatic lymphadenopathy.  This mass measures up to 34 mm and encases multiple vessels including the right upper lobe pulmonary vessels, superior vena cava, and brachiocephalic vessel.  He also had a large poorly defined malignant appearing infiltrative mass measuring 6.2 x 5.6 x 7.9 cm within the right anterior and middle mediastinum with suspected invasion of the distal left brachiocephalic vein and probable tumor occlusion of the right subclavian and jugular veins.  There is also suspected tumor invasion of the upper SVC with string-like narrowing of the SVC but with patency of the SVC just above the right atrium.  The tumor abuts the aorta and great vessels and also results in significant narrowing of the right upper lobe pulmonary arterial vessels.  He also had a CT of the abdomen pelvis which showed a 15 mm heterogeneously enhancing lesion  in the upper pole of the right kidney concerning for renal cell carcinoma, no lymphadenopathy in the abdomen or pelvis, focal hyperenhancement of the anterior left liver most likely related to aberrant venous anatomy/flow, sclerotic lesions in the right acetabulum and left iliac bone are indeterminate.  These are likely bone islands but attention on follow-up is recommended a metastatic disease cannot be excluded.  MRI of the brain with and without contrast did not show any evidence of intracranial metastatic disease. The patient underwent a CT-guided biopsy in interventional radiology on 01/22/2019.  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   When seen today, patient reports ongoing pain and swelling to his right arm and right side of his neck.  Reports ongoing pain to his right chest.  He reports intermittent facial swelling which is worse in the morning and improves throughout the day.  He reports anorexia and a weight loss of 20 pounds over the past few weeks. He has had dizziness for the past few months.  Denies headaches.  He also noted that on the day of admission his peripheral vision was poor.  His vision is now improved.  He denies food getting stuck or difficulty swallowing but does feel as though his throat is "tight."  He reports a nonproductive cough.  Denies fevers and chills.  He is not really having any shortness of breath.  Denies abdominal pain, nausea, vomiting, constipation, diarrhea.  He has not noticed any epistaxis, hemoptysis, hematemesis, hematuria, melena, hematochezia.  The patient is single.  He has 1 daughter who lives in  [place], [place]  Reports occasional alcohol use and currently smokes 3 cigars a day since 1997.  He is currently living in a rooming house with other roommates who all have their own room.  They share a common bathroom and kitchen.  Medical oncology was asked see the  patient to make recommendations regarding his newly diagnosed poorly differentiated carcinoma.  History reviewed. No pertinent past medical history.  INTERVAL HISTORY:   David Berry. is a wonderful 53 y.o. male who is here for evaluation and management of newly diagnosed metastatic poorly differentiated lung adenocarcinoma. The patient's last visit with [place] was on 02/26/2019. The pt reports that he is doing well overall.  The pt reports black dots on his right collar bone, which itch sometimes. Pt has finished radiation and chemotherapy and is concerned that his skin rash was caused by him beginning Eliquis. His throat is very sore, worse when he coughs or laughs and he is still having issues eating. He has been using Magic Mouthwash and Fentanyl patch. They are helping some but he is still experiencing pain. He is having no issues using the Fentanyl patch. Pt has been taking one Oxycodone in the evenings before he lays down which also help him sleep. He has been taking it in pill form. Pt is no longer using Lorazepam, Decadron, Compazine or Zofran. He is having no issues with nausea at this time. He feels that there has been a significant change in his vision and has not been to see an Optometrist in awhile. Pt was diagnosed with Glaucoma in 1989-1990. Pt has continued to use 2 Boost supplements per day.   On review of systems, pt reports black spots on his collar bone, throat pain/soreness, blurry vision and denies nausea and any other symptoms.   MEDICAL HISTORY:  Past Medical History:  Diagnosis Date   Blood in stool    Cancer ([place])    Chronic bronchitis with emphysema (HCC)    GERD (gastroesophageal reflux disease)     SURGICAL HISTORY: Past Surgical History:  Procedure Laterality Date   APPENDECTOMY     teenager   INGUINAL HERNIA REPAIR Right 2016, 2017   [facility], 2018 [facility]-removed mesh    SOCIAL HISTORY: Social History   Socioeconomic History    Marital status: Single    Spouse name: Not on file   Number of children: 1   Years of education: GED   Highest education level: Not on file  Occupational History    Comment: [employer]  Social Designer, fashion/clothing strain: Not on file   Food insecurity    Worry: Not on file    Inability: Not on file   Transportation needs    Medical: Not on file    Non-medical: Not on file  Tobacco Use   Smoking status: Former Smoker    Types: Cigars   Smokeless tobacco: Never Used   Tobacco comment: smokes 3 black and mild cigars per day  Substance and Sexual Activity   Alcohol use: Not Currently   Drug use: No   Sexual activity: Yes  Lifestyle   Physical activity    Days per week: Not on file    Minutes per session: Not on file   Stress: Not on file  Relationships   Social connections    Talks on phone: Not on file    Gets together: Not on file    Attends religious service: Not on file  Active member of club or organization: Not on file    Attends meetings of clubs or organizations: Not on file    Relationship status: Not on file   Intimate partner violence    Fear of current or ex partner: Not on file    Emotionally abused: Not on file    Physically abused: Not on file    Forced sexual activity: Not on file  Other Topics Concern   Not on file  Social History Narrative   Lives alone   Caffeine- coffee, 20 oz daily, tea occas    FAMILY HISTORY: Family History  Problem Relation Age of Onset   Prostate cancer Father     ALLERGIES:  is allergic to pregabalin.  MEDICATIONS:  Current Outpatient Medications  Medication Sig Dispense Refill   apixaban (ELIQUIS) 5 MG TABS tablet Take 1 tablet (5 mg total) by mouth 2 (two) times daily. 60 tablet 5   dexamethasone (DECADRON) 4 MG tablet Take 2 tablets (8 mg total) by mouth daily. Start the day after chemotherapy for 2 days. 30 tablet 1   fentaNYL (DURAGESIC) 12 MCG/HR Place 1 patch onto the skin  every 3 (three) days. (Patient not taking: Reported on 02/22/2019) 10 patch 0   LORazepam (ATIVAN) 0.5 MG tablet Take 1 tablet (0.5 mg total) by mouth every 6 (six) hours as needed (Nausea or vomiting). 30 tablet 0   magic mouthwash w/lidocaine SOLN Take 5 mLs by mouth 4 (four) times daily as needed for mouth pain. Swish in the mouth and swallow for radiation related throat pain 480 mL 1   ondansetron (ZOFRAN) 8 MG tablet Take 1 tablet (8 mg total) by mouth 2 (two) times daily as needed for refractory nausea / vomiting. Start on day 3 after chemo. 30 tablet 1   oxyCODONE (OXY IR/ROXICODONE) 5 MG immediate release tablet Take 1-2 tablets (5-10 mg total) by mouth every 4 (four) hours as needed for severe pain. 60 tablet 0   prochlorperazine (COMPAZINE) 10 MG tablet Take 1 tablet (10 mg total) by mouth every 6 (six) hours as needed (Nausea or vomiting). 30 tablet 1   No current facility-administered medications for this visit.     REVIEW OF SYSTEMS:   A 10+ POINT REVIEW OF SYSTEMS WAS OBTAINED including neurology, dermatology, psychiatry, cardiac, respiratory, lymph, extremities, GI, GU, Musculoskeletal, constitutional, breasts, reproductive, HEENT.  All pertinent positives are noted in the HPI.  All others are negative.   PHYSICAL EXAMINATION: ECOG PERFORMANCE STATUS: 1 - Symptomatic but completely ambulatory  . Vitals:   03/06/19 0939  BP: 101/67  Pulse: 93  Resp: 18  Temp: 98.2 F (36.8 C)  SpO2: 100%   Filed Weights   03/06/19 0939  Weight: 123 lb 12.8 oz (56.2 kg)   .Body mass index is 17.27 kg/m.   GENERAL:alert, in no acute distress and comfortable SKIN: no acute rashes, no significant lesions EYES: conjunctiva are pink and non-injected, sclera anicteric OROPHARYNX: MMM, no exudates, no oropharyngeal erythema or ulceration NECK: supple, no JVD LYMPH:  no palpable lymphadenopathy in the cervical, axillary or inguinal regions LUNGS: clear to auscultation b/l with normal  respiratory effort HEART: regular rate & rhythm ABDOMEN:  normoactive bowel sounds , non tender, not distended. No palpable hepatosplenomegaly.  Extremity: no pedal edema PSYCH: alert & oriented x 3 with fluent speech NEURO: no focal motor/sensory deficits  LABORATORY DATA:  I have reviewed the data as listed  . CBC Latest Ref Rng & Units 02/26/2019 02/19/2019 02/12/2019  WBC 4.0 - 10.5 K/uL 5.6 5.0 3.1(L)  Hemoglobin 13.0 - 17.0 g/dL 12.2(L) 11.5(L) 11.9(L)  Hematocrit 39.0 - 52.0 % 37.3(L) 35.8(L) 37.0(L)  Platelets 150 - 400 K/uL 285 356 381    . CMP Latest Ref Rng & Units 02/26/2019 02/19/2019 02/12/2019  Glucose 70 - 99 mg/dL 107(H) 134(H) 117(H)  BUN 6 - 20 mg/dL '9 8 10  '$ Creatinine 0.61 - 1.24 mg/dL 0.81 0.80 0.84  Sodium 135 - 145 mmol/L 138 139 139  Potassium 3.5 - 5.1 mmol/L 3.7 4.0 3.8  Chloride 98 - 111 mmol/L 102 103 103  CO2 22 - 32 mmol/L '25 26 25  '$ Calcium 8.9 - 10.3 mg/dL 9.5 9.4 9.4  Total Protein 6.5 - 8.1 g/dL 6.9 6.9 7.1  Total Bilirubin 0.3 - 1.2 mg/dL 0.2(L) <0.2(L) <0.2(L)  Alkaline Phos 38 - 126 U/L 108 116 140(H)  AST 15 - 41 U/L 13(L) 20 17  ALT 0 - 44 U/L 24 39 22    01/22/2019 [facility]: Tumor Mutational Burden     01/22/2019 PD-L1 Immunohistochemistry Analysis    01/22/2019 Soft Tissue Needle Core Biopsy Surgical Pathology     RADIOGRAPHIC STUDIES: I have personally reviewed the radiological images as listed and agreed with the findings in the report. No results found.  ASSESSMENT & PLAN:   This is a 53 year old malewith  1. 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 Acute DVT- Right IJ, Right Innominate Vein, and likely  also the SVC- related to malignancy+ tobacco-  on lovenox  4. Symptomatic hemorrhoids  5. S  PLAN:  -Advised pt that skin changes appear to be due to radiation + chemotherapy, changes can occur post-treatment -Recommended that the pt continue to eat well, drink at least 48-64 oz of water each day, and walk 20-30 minutes each day.  -Discussed immunotherapy only vs. immunotherapy + chemotherapy  -Recommended immunotherapy + chemotherapy due to aggressive nature of pt's lung adenocarcinoma -Will begin Carboplatin/Taxol/Atezolizumab in 2 weeks, will consider starting with just immunotherapy C1 and add in chemotherapy C2 if pt is still healing from radiation -Plan for 4 cycles of Carboplatin/Taxol/Atezolizumab -Recommended OTC Cortisone or Vaseline/Aquaphor for skin itchiness -Continue Oxycodone prn  -Continue Eliquis '5mg'$  po BID 2 hours before his scheduled dose of Lovenox -Refill Oxycodone -Will see back in 2 weeks with C1 of Carboplatin/Taxol/Atezolizumab and labs   3. Normocytic anemia -Likely due to underlying malignancy -Will check ferritin, iron studies, vitamin B12 level, and RBC folate in the a.m.  4 Thrombocytosis -- likely due to paraneoplastic effect of tumor and reactive due to inflammation and tissue inflammation from RT.-- now resolved.  5. Nicotine dependence -He was counseled about tobacco cessation.   FOLLOW UP: -Schedule to start Carboplatin/Taxol/Atezolizumab in 2 weeks with labs and MD visit      03/12/2019 David Lone, MD   The total time spent in the appt was 30 minutes and more than 50% was on counseling and direct patient cares.  All of the patient's questions were answered with apparent satisfaction. The patient knows to call the clinic with any problems, questions or concerns.   David Lone MD [place] AAHIVMS [facility] Hematology/Oncology Physician [facility]  (Office):       [phone] (Work  cell):  [phone] (Fax):            [phone]  03/06/2019 10:14 AM  I, Yevette Edwards, am acting as a Education administrator for Dr. Sullivan Berry.   I have reviewed the above documentation for accuracy and completeness, and I agree with the above. Brunetta Genera MD

## 2019-03-05 NOTE — Telephone Encounter (Signed)
Rash - black/red blotches around the base of his neck. Itches. No pain. Started last week when he started Eliquis and Fentanyl. Very concerned. Dr.Kale asked patient to come in tomorrow for evaluation. Appointment made for in the morning. Patient verbalized understanding.

## 2019-03-06 ENCOUNTER — Inpatient Hospital Stay (HOSPITAL_BASED_OUTPATIENT_CLINIC_OR_DEPARTMENT_OTHER): Payer: Medicaid Other | Admitting: Hematology

## 2019-03-06 ENCOUNTER — Ambulatory Visit: Payer: Medicaid Other

## 2019-03-06 ENCOUNTER — Other Ambulatory Visit: Payer: Self-pay

## 2019-03-06 VITALS — BP 101/67 | HR 93 | Temp 98.2°F | Resp 18 | Ht 71.0 in | Wt 123.8 lb

## 2019-03-06 DIAGNOSIS — Z5111 Encounter for antineoplastic chemotherapy: Secondary | ICD-10-CM | POA: Diagnosis not present

## 2019-03-06 DIAGNOSIS — K1233 Oral mucositis (ulcerative) due to radiation: Secondary | ICD-10-CM

## 2019-03-06 DIAGNOSIS — I82621 Acute embolism and thrombosis of deep veins of right upper extremity: Secondary | ICD-10-CM

## 2019-03-06 DIAGNOSIS — C3491 Malignant neoplasm of unspecified part of right bronchus or lung: Secondary | ICD-10-CM

## 2019-03-06 DIAGNOSIS — G893 Neoplasm related pain (acute) (chronic): Secondary | ICD-10-CM

## 2019-03-06 MED ORDER — OXYCODONE HCL 5 MG PO TABS: 5.0000 mg | ORAL_TABLET | ORAL | 0 refills | Status: DC | PRN

## 2019-03-06 MED FILL — MAGIC MOUTHWASH SPC#4: 24 days supply | Qty: 480 | Fill #1

## 2019-03-06 MED FILL — oxyCODONE HCL 5 MG TABS: 5 | 5 days supply | Qty: 60 | Fill #0

## 2019-03-07 ENCOUNTER — Ambulatory Visit: Payer: Medicaid Other

## 2019-03-07 ENCOUNTER — Telehealth: Payer: Self-pay | Admitting: Hematology

## 2019-03-07 NOTE — Telephone Encounter (Signed)
Scheduled appt per 10/27 los.  Spoke with pt and he is aware of his scheduled appt date and time.

## 2019-03-08 ENCOUNTER — Ambulatory Visit: Payer: Medicaid Other

## 2019-03-09 ENCOUNTER — Ambulatory Visit: Payer: Medicaid Other

## 2019-03-09 ENCOUNTER — Telehealth: Payer: Self-pay | Admitting: *Deleted

## 2019-03-09 NOTE — Telephone Encounter (Signed)
Patient called to verify if he needed to come to appts on Monday 11/2 - stated he was here 10/27 and had appt on 11/11 for chemo and to see Dr.Kale. Per Dr.Kale - he does not need to come in on 11/2. Attempted to contact patient - LVVM with information and informed that appts for 11/2 will be cancelled.

## 2019-03-12 ENCOUNTER — Inpatient Hospital Stay: Payer: Medicaid Other

## 2019-03-12 ENCOUNTER — Inpatient Hospital Stay: Payer: Medicaid Other | Admitting: Hematology

## 2019-03-15 ENCOUNTER — Encounter: Payer: Self-pay | Admitting: Pharmacy Technician

## 2019-03-20 ENCOUNTER — Other Ambulatory Visit: Payer: Self-pay | Admitting: Hematology

## 2019-03-20 NOTE — Progress Notes (Signed)
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 is a 53 year old male with no significant medical history.  He presented to the hospital with progressive right arm and chest pain with new right-sided neck swelling.  The patient states that he was diagnosed with carpal tunnel syndrome received an injection in August.  However, he continued to have progressive pain up and down his right arm that would also radiate to his right chest.  He took ibuprofen with no improvement.  1 week prior to admission, he developed new right sided neck swelling.  He presented to the emergency room for further evaluation.  Work-up in the emergency room was significant for a CT angiogram of the neck and chest which revealed a bulky soft tissue mass in the right neck continuing into the upper chest most compatible with extensive metastatic lymphadenopathy.  This mass measures up to 34 mm and encases multiple vessels including the right upper lobe pulmonary vessels, superior vena cava, and brachiocephalic vessel.  He also had a large poorly defined malignant appearing infiltrative mass measuring 6.2 x 5.6 x 7.9 cm within the right anterior and middle mediastinum with suspected invasion of the distal left brachiocephalic vein and probable tumor occlusion of the right subclavian and jugular veins.  There is also suspected tumor invasion of the upper SVC with string-like narrowing of the SVC but with patency of the SVC just above the right atrium.  The tumor abuts the aorta and great vessels and also results in significant narrowing of the right upper lobe pulmonary arterial vessels.  He also had a CT of the abdomen pelvis which showed a 15 mm heterogeneously enhancing  lesion in the upper pole of the right kidney concerning for renal cell carcinoma, no lymphadenopathy in the abdomen or pelvis, focal hyperenhancement of the anterior left liver most likely related to aberrant venous anatomy/flow, sclerotic lesions in the right acetabulum and left iliac bone are indeterminate.  These are likely bone islands but attention on follow-up is recommended a metastatic disease cannot be excluded.  MRI of the brain with and without contrast did not show any evidence of intracranial metastatic disease. The patient underwent a CT-guided biopsy in interventional radiology on 01/22/2019.  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   When seen today, patient reports ongoing pain and swelling to his right arm and right side of his neck.  Reports ongoing pain to his right chest.  He reports intermittent facial swelling which is worse in the morning and improves throughout the day.  He reports anorexia and a weight loss of 20 pounds over the past few weeks. He has had dizziness for the past few months.  Denies headaches.  He also noted that on the day of admission his peripheral vision was poor.  His vision is now improved.  He denies food getting stuck or difficulty swallowing but does feel as though his throat is "tight."  He reports a nonproductive cough.  Denies fevers and chills.  He is not really having any shortness of breath.  Denies abdominal pain, nausea, vomiting, constipation, diarrhea.  He has not noticed any epistaxis, hemoptysis, hematemesis, hematuria, melena, hematochezia.  The patient is single.  He has 1 daughter who lives in  [place], [place]  Reports occasional alcohol use and currently smokes 3 cigars a day since 1997.  He is currently living in a rooming house with other roommates who all have their own room.  They share a common bathroom and kitchen.  Medical oncology was asked see  the patient to make recommendations regarding his newly diagnosed poorly differentiated carcinoma.  History reviewed. No pertinent past medical history.  INTERVAL HISTORY:   [name]. is a wonderful 53 y.o. male who is here for evaluation and management of newly diagnosed metastatic poorly differentiated lung adenocarcinoma. The patient's last visit with [place] was on 03/06/2019. The pt reports that he is doing well overall.  The pt reports that radiation changes to his chest is causing some itching for which he is using A&D ointment. Pt's throat pain has improved and he has been able to eat solid food without any discomfort. He has been able to eat almost the same amount as before his treatments began. Pt continues to use 2 Boost supplements per day. He removed his last Fentanyl patch on Saturday morning and his pain has still been well controlled. He has not been using any Oxycodone recently. Pt does report constipation while using the Fentanyl patch which has resolved after he discontinued use. Pt is ready to go back to work to improve his financial situation as the process to get financial support is taking a very long time. He has continued to take Eliquis as prescribed.   Lab results today (03/21/19) of CBC w/diff and CMP is as follows: all values are WNL except for Hgb at 12.7, HCT at 38.5, RDW at 18.3, Albumin at 3.4, AST at 10. 03/21/2019 Magnesium at 1.9  On review of systems, pt reports improving throat discomfort, chest itching, chest pain and denies constipation, abdominal pain and any other symptoms.   MEDICAL HISTORY:  Past Medical History:  Diagnosis Date   Blood in stool    Cancer ([place])    Chronic bronchitis with emphysema (HCC)    GERD (gastroesophageal reflux disease)     SURGICAL HISTORY: Past Surgical History:  Procedure Laterality Date   APPENDECTOMY     teenager   INGUINAL HERNIA REPAIR Right 2016, 2017   [facility], 2018 [facility]-removed mesh     SOCIAL HISTORY: Social History   Socioeconomic History   Marital status: Single    Spouse name: Not on file   Number of children: 1   Years of education: GED   Highest education level: Not on file  Occupational History    Comment: [employer]  Social Designer, fashion/clothing strain: Not on file   Food insecurity    Worry: Not on file    Inability: Not on file   Transportation needs    Medical: Not on file    Non-medical: Not on file  Tobacco Use   Smoking status: Former Smoker    Types: Cigars   Smokeless tobacco: Never Used   Tobacco comment: smokes 3 black and mild cigars per day  Substance and Sexual Activity   Alcohol use: Not Currently   Drug use: No   Sexual activity: Yes  Lifestyle   Physical activity    Days per week: Not on file    Minutes per session: Not on file   Stress: Not on file  Relationships   Social connections    Talks on phone: Not on file    Gets together: Not on file  Attends religious service: Not on file    Active member of club or organization: Not on file    Attends meetings of clubs or organizations: Not on file    Relationship status: Not on file   Intimate partner violence    Fear of current or ex partner: Not on file    Emotionally abused: Not on file    Physically abused: Not on file    Forced sexual activity: Not on file  Other Topics Concern   Not on file  Social History Narrative   Lives alone   Caffeine- coffee, 20 oz daily, tea occas    FAMILY HISTORY: Family History  Problem Relation Age of Onset   Prostate cancer Father     ALLERGIES:  is allergic to pregabalin.  MEDICATIONS:  Current Outpatient Medications  Medication Sig Dispense Refill   apixaban (ELIQUIS) 5 MG TABS tablet Take 1 tablet (5 mg total) by mouth 2 (two) times daily. 60 tablet 5   fentaNYL (DURAGESIC) 12 MCG/HR Place 1 patch onto the skin every 3 (three) days. (Patient not taking: Reported on 02/22/2019) 10 patch  0   magic mouthwash w/lidocaine SOLN Take 5 mLs by mouth 4 (four) times daily as needed for mouth pain. Swish in the mouth and swallow for radiation related throat pain 480 mL 1   ondansetron (ZOFRAN) 8 MG tablet Take 1 tablet (8 mg total) by mouth 2 (two) times daily as needed for refractory nausea / vomiting. Start on day 3 after carboplatin chemo. 30 tablet 1   oxyCODONE (OXY IR/ROXICODONE) 5 MG immediate release tablet Take 1-2 tablets (5-10 mg total) by mouth every 4 (four) hours as needed for severe pain. 60 tablet 0   prochlorperazine (COMPAZINE) 10 MG tablet Take 1 tablet (10 mg total) by mouth every 6 (six) hours as needed (Nausea or vomiting). 30 tablet 1   Current Facility-Administered Medications  Medication Dose Route Frequency Provider Last Rate Last Dose   0.9 %  sodium chloride infusion   Intravenous Continuous Irene Limbo, Cloria Spring, MD        REVIEW OF SYSTEMS:   A 10+ POINT REVIEW OF SYSTEMS WAS OBTAINED including neurology, dermatology, psychiatry, cardiac, respiratory, lymph, extremities, GI, GU, Musculoskeletal, constitutional, breasts, reproductive, HEENT.  All pertinent positives are noted in the HPI.  All others are negative.   PHYSICAL EXAMINATION: ECOG PERFORMANCE STATUS: 1 - Symptomatic but completely ambulatory  . Vitals:   03/21/19 0906 03/21/19 0907  BP: (!) 86/86 (!) 88/65  Pulse: (!) 109   Resp: 18   Temp: 98.2 F (36.8 C)   SpO2: 99%    Filed Weights   03/21/19 0906  Weight: 126 lb 1.6 oz (57.2 kg)   .Body mass index is 17.59 kg/m.    GENERAL:alert, in no acute distress and comfortable SKIN: no acute rashes, no significant lesions EYES: conjunctiva are pink and non-injected, sclera anicteric OROPHARYNX: MMM, no exudates, no oropharyngeal erythema or ulceration NECK: supple, no JVD LYMPH:  no palpable lymphadenopathy in the cervical, axillary or inguinal regions LUNGS: clear to auscultation b/l with normal respiratory effort HEART: regular  rate & rhythm ABDOMEN:  normoactive bowel sounds , non tender, not distended. No palpable hepatosplenomegaly.  Extremity: no pedal edema PSYCH: alert & oriented x 3 with fluent speech NEURO: no focal motor/sensory deficits  LABORATORY DATA:  I have reviewed the data as listed  . CBC Latest Ref Rng & Units 03/21/2019 02/26/2019 02/19/2019  WBC 4.0 - 10.5 K/uL 5.0 5.6  5.0  Hemoglobin 13.0 - 17.0 g/dL 12.7(L) 12.2(L) 11.5(L)  Hematocrit 39.0 - 52.0 % 38.5(L) 37.3(L) 35.8(L)  Platelets 150 - 400 K/uL 219 285 356    . CMP Latest Ref Rng & Units 03/21/2019 02/26/2019 02/19/2019  Glucose 70 - 99 mg/dL 85 107(H) 134(H)  BUN 6 - 20 mg/dL '6 9 8  ' Creatinine 0.61 - 1.24 mg/dL 0.83 0.81 0.80  Sodium 135 - 145 mmol/L 137 138 139  Potassium 3.5 - 5.1 mmol/L 4.3 3.7 4.0  Chloride 98 - 111 mmol/L 103 102 103  CO2 22 - 32 mmol/L '24 25 26  ' Calcium 8.9 - 10.3 mg/dL 9.5 9.5 9.4  Total Protein 6.5 - 8.1 g/dL 7.2 6.9 6.9  Total Bilirubin 0.3 - 1.2 mg/dL 0.3 0.2(L) <0.2(L)  Alkaline Phos 38 - 126 U/L 116 108 116  AST 15 - 41 U/L 10(L) 13(L) 20  ALT 0 - 44 U/L 13 24 39    01/22/2019 [facility]: Tumor Mutational Burden     01/22/2019 PD-L1 Immunohistochemistry Analysis    01/22/2019 Soft Tissue Needle Core Biopsy Surgical Pathology     RADIOGRAPHIC STUDIES: I have personally reviewed the radiological images as listed and agreed with the findings in the report. No results found.  ASSESSMENT & PLAN:   This is a 53 year old malewith  1. 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 Acute DVT- Right IJ, Right Innominate Vein, and likely also the SVC- related to malignancy+ tobacco-   on lovenox  4. Symptomatic hemorrhoids  5. S  PLAN:  -Discussed pt labwork today, 03/21/19; all values are WNL except for Hgb at 12.7, HCT at 38.5, RDW at 18.3, Albumin at 3.4, AST at 10. -Discussed 03/21/2019 Magnesium at 1.9 -Will start pt on Atezolizumab today with Carboplatin/Taxol to start next cycle -Pt would like to continue holding off on placing a port -Plan for 4-5 cycles of Carboplatin/Taxol/Atezolizumab -Plan for 3-4 cycles of Carboplatin/Taxol/Atezolizumab before repeat scans  -Advised proper safety precautions while returning to work, especially in light of the current global pandemic -Continue Oxycodone prn  -patient has discontinued fentanyl patch -Continue Eliquis 73m po BID  -Will see back in 7-8 days for a toxicity chech  3. Normocytic anemia -Likely due to underlying malignancy -Will check ferritin, iron studies, vitamin B12 level, and RBC folate in the a.m.  4 Thrombocytosis -- likely due to paraneoplastic effect of tumor and reactive due to inflammation and tissue inflammation from RT.-- now resolved.  5. Nicotine dependence -He was counseled about tobacco cessation.   FOLLOW UP: Only Atezolizumab this cycle today Please remove appointment C1D3 (no udenyca this cycle) RTC with Dr KIrene Limboin 7-8 days with labs for toxicity check Please schedule C2 Carboplatin/taxol/teqcentric wih D3 Udenyca in 3 weeks with labs and MD visit   The total time spent in the appt was 25 minutes and more than 50% was on counseling and direct patient cares.  All of the patient's questions were answered with apparent satisfaction. The patient knows to call the clinic with any problems, questions or concerns.   GSullivan LoneMD MSt. RoseAAHIVMS SThe Hospitals Of Providence Memorial CampusCBrookings Health SystemHematology/Oncology Physician [facility] (Office):       [phone](Work cell):  [phone](Fax):  [phone]  03/21/2019 9:48 AM  I, Yevette Edwards, am acting as a scribe for Dr. Sullivan Lone.    .I have reviewed the above documentation for accuracy and completeness, and I agree with the above. Brunetta Genera MD

## 2019-03-20 NOTE — Progress Notes (Signed)
DISCONTINUE ON PATHWAY REGIMEN - Non-Small Cell Lung     Administer weekly:     Paclitaxel      Carboplatin   **Always confirm dose/schedule in your pharmacy ordering system**  REASON: Disease Progression PRIOR TREATMENT: [id number]: Carboplatin AUC=2 + Paclitaxel 45 mg/m2 Weekly During Radiation TREATMENT RESPONSE: Partial Response (PR)  START ON PATHWAY REGIMEN - Non-Small Cell Lung     A cycle is every 21 days:     Atezolizumab      Bevacizumab-xxxx      Paclitaxel      Carboplatin   **Always confirm dose/schedule in your pharmacy ordering system**  Patient Characteristics: Stage IV Metastatic, Nonsquamous, Initial Chemotherapy/Immunotherapy, PS = 0, 1, ALK Rearrangement Negative and EGFR Mutation Negative/Non-Sensitizing, PD-L1 Expression Positive  ? 50% (TPS) and Immunotherapy Candidate AJCC T Category: Staged < 8th Ed. Current Disease Status: Distant Metastases AJCC N Category: Staged < 8th Ed. AJCC M Category: M1c AJCC 8 Stage Grouping: IVB Histology: Nonsquamous Cell ROS1 Rearrangement Status: Negative T790M Mutation Status: Not Applicable - EGFR Mutation Negative/Unknown Other Mutations/Biomarkers: No Other Actionable Mutations Chemotherapy/Immunotherapy LOT: Initial Chemotherapy/Immunotherapy Molecular Targeted Therapy: Not Appropriate MET Exon 14 Mutation Status: Negative RET Gene Fusion Status: Negative EGFR Mutation Status: Negative/Wild Type NTRK Gene Fusion Status: Negative PD-L1 Expression Status: PD-L1 Positive ? 50% (TPS) ALK Rearrangement Status: Negative BRAF V600E Mutation Status: Negative ECOG Performance Status: 1 Immunotherapy Candidate Status: Candidate for Immunotherapy Intent of Therapy: Non-Curative / Palliative Intent, Discussed with Patient

## 2019-03-20 NOTE — Progress Notes (Signed)
Chemo ordered placed. Only Atezolizumab 1st cycle. Once recovered from RT side effects will plan to add back carbo/taxol

## 2019-03-21 ENCOUNTER — Inpatient Hospital Stay: Payer: Medicaid Other | Attending: Hematology

## 2019-03-21 ENCOUNTER — Inpatient Hospital Stay: Payer: Medicaid Other

## 2019-03-21 ENCOUNTER — Inpatient Hospital Stay: Payer: Medicaid Other | Admitting: Nutrition

## 2019-03-21 ENCOUNTER — Other Ambulatory Visit: Payer: Self-pay

## 2019-03-21 ENCOUNTER — Encounter: Payer: Self-pay | Admitting: Hematology

## 2019-03-21 ENCOUNTER — Inpatient Hospital Stay (HOSPITAL_BASED_OUTPATIENT_CLINIC_OR_DEPARTMENT_OTHER): Payer: Medicaid Other | Admitting: Hematology

## 2019-03-21 VITALS — BP 88/65 | HR 109 | Temp 98.2°F | Resp 18 | Ht 71.0 in | Wt 126.1 lb

## 2019-03-21 DIAGNOSIS — C3491 Malignant neoplasm of unspecified part of right bronchus or lung: Secondary | ICD-10-CM

## 2019-03-21 DIAGNOSIS — I82621 Acute embolism and thrombosis of deep veins of right upper extremity: Secondary | ICD-10-CM

## 2019-03-21 DIAGNOSIS — Z7189 Other specified counseling: Secondary | ICD-10-CM

## 2019-03-21 DIAGNOSIS — G893 Neoplasm related pain (acute) (chronic): Secondary | ICD-10-CM

## 2019-03-21 DIAGNOSIS — C3411 Malignant neoplasm of upper lobe, right bronchus or lung: Secondary | ICD-10-CM | POA: Insufficient documentation

## 2019-03-21 DIAGNOSIS — K1233 Oral mucositis (ulcerative) due to radiation: Secondary | ICD-10-CM

## 2019-03-21 DIAGNOSIS — Z5112 Encounter for antineoplastic immunotherapy: Secondary | ICD-10-CM | POA: Insufficient documentation

## 2019-03-21 DIAGNOSIS — E860 Dehydration: Secondary | ICD-10-CM

## 2019-03-21 LAB — CBC WITH DIFFERENTIAL/PLATELET
Abs Immature Granulocytes: 0.01 10*3/uL (ref 0.00–0.07)
Basophils Absolute: 0 10*3/uL (ref 0.0–0.1)
Basophils Relative: 0 %
Eosinophils Absolute: 0.1 10*3/uL (ref 0.0–0.5)
Eosinophils Relative: 1 %
HCT: 38.5 % — ABNORMAL LOW (ref 39.0–52.0)
Hemoglobin: 12.7 g/dL — ABNORMAL LOW (ref 13.0–17.0)
Immature Granulocytes: 0 %
Lymphocytes Relative: 13 %
Lymphs Abs: 0.7 10*3/uL (ref 0.7–4.0)
MCH: 28.3 pg (ref 26.0–34.0)
MCHC: 33 g/dL (ref 30.0–36.0)
MCV: 85.7 fL (ref 80.0–100.0)
Monocytes Absolute: 0.8 10*3/uL (ref 0.1–1.0)
Monocytes Relative: 16 %
Neutro Abs: 3.4 10*3/uL (ref 1.7–7.7)
Neutrophils Relative %: 70 %
Platelets: 219 10*3/uL (ref 150–400)
RBC: 4.49 MIL/uL (ref 4.22–5.81)
RDW: 18.3 % — ABNORMAL HIGH (ref 11.5–15.5)
WBC: 5 10*3/uL (ref 4.0–10.5)
nRBC: 0 % (ref 0.0–0.2)

## 2019-03-21 LAB — CMP (CANCER CENTER ONLY)
ALT: 13 U/L (ref 0–44)
AST: 10 U/L — ABNORMAL LOW (ref 15–41)
Albumin: 3.4 g/dL — ABNORMAL LOW (ref 3.5–5.0)
Alkaline Phosphatase: 116 U/L (ref 38–126)
Anion gap: 10 (ref 5–15)
BUN: 6 mg/dL (ref 6–20)
CO2: 24 mmol/L (ref 22–32)
Calcium: 9.5 mg/dL (ref 8.9–10.3)
Chloride: 103 mmol/L (ref 98–111)
Creatinine: 0.83 mg/dL (ref 0.61–1.24)
GFR, Est AFR Am: >60 mL/min (ref >60)
GFR, Estimated: >60 mL/min (ref >60)
Glucose, Bld: 85 mg/dL (ref 70–99)
Potassium: 4.3 mmol/L (ref 3.5–5.1)
Sodium: 137 mmol/L (ref 135–145)
Total Bilirubin: 0.3 mg/dL (ref 0.3–1.2)
Total Protein: 7.2 g/dL (ref 6.5–8.1)

## 2019-03-21 LAB — MAGNESIUM: Magnesium: 1.9 mg/dL (ref 1.7–2.4)

## 2019-03-21 MED ORDER — DIPHENHYDRAMINE HCL 50 MG/ML IJ SOLN
INTRAMUSCULAR | Status: AC
  Filled 2019-03-21: qty 1

## 2019-03-21 MED ORDER — SODIUM CHLORIDE 0.9 % IV SOLN
Freq: Once | INTRAVENOUS | Status: DC
  Filled 2019-03-21: qty 250

## 2019-03-21 MED ORDER — PROCHLORPERAZINE MALEATE 10 MG PO TABS: 10.0000 mg | ORAL_TABLET | Freq: Four times a day (QID) | ORAL | 1 refills | Status: DC | PRN

## 2019-03-21 MED ORDER — ONDANSETRON HCL 8 MG PO TABS: 8.0000 mg | ORAL_TABLET | Freq: Two times a day (BID) | ORAL | 1 refills | Status: DC | PRN

## 2019-03-21 MED ORDER — SODIUM CHLORIDE 0.9 % IV SOLN
INTRAVENOUS | Status: AC
  Administered 2019-03-21: 11:00:00 via INTRAVENOUS
  Filled 2019-03-21: qty 250

## 2019-03-21 MED ORDER — DIPHENHYDRAMINE HCL 50 MG/ML IJ SOLN
50.0000 mg | Freq: Once | INTRAMUSCULAR | Status: AC
  Administered 2019-03-21: 50 mg via INTRAVENOUS

## 2019-03-21 MED ORDER — FAMOTIDINE IN NACL 20-0.9 MG/50ML-% IV SOLN
INTRAVENOUS | Status: AC
  Filled 2019-03-21: qty 50

## 2019-03-21 MED ORDER — FAMOTIDINE IN NACL 20-0.9 MG/50ML-% IV SOLN
20.0000 mg | Freq: Once | INTRAVENOUS | Status: AC
  Administered 2019-03-21: 20 mg via INTRAVENOUS

## 2019-03-21 MED ORDER — SODIUM CHLORIDE 0.9 % IV SOLN
1200.0000 mg | Freq: Once | INTRAVENOUS | Status: AC
  Administered 2019-03-21: 12:00:00 1200 mg via INTRAVENOUS
  Filled 2019-03-21: qty 20

## 2019-03-21 NOTE — Progress Notes (Signed)
Nutrition follow-up completed with patient during infusion for lung cancer. Patient is pleased with weight gain. Weight increased and was documented as 126.1 pounds on November 11 increased from 123.3 pounds October 19. Noted albumin 3.4. Patient tries to drink at least 2 boost plus every day.  Nutrition diagnosis: Unintended weight loss improved.  Intervention: Enforced importance of continuing adequate calories and protein throughout the day. Provided coupons for boost plus.  Monitoring, evaluation, goals: Patient will continue to tolerate increased calories and protein for weight gain.  Next visit: To be scheduled as needed.  **Disclaimer: This note was dictated with voice recognition software. Similar sounding words can inadvertently be transcribed and this note may contain transcription errors which may not have been corrected upon publication of note.**

## 2019-03-21 NOTE — Patient Instructions (Signed)
[facility] Discharge Instructions for Patients Receiving Chemotherapy  Today you received the following chemotherapy agents:  Atezolizumab  To help prevent nausea and vomiting after your treatment, we encourage you to take your nausea medication as prescribed.   If you develop nausea and vomiting that is not controlled by your nausea medication, call the clinic.   BELOW ARE SYMPTOMS THAT SHOULD BE REPORTED IMMEDIATELY:  *FEVER GREATER THAN 100.5 F  *CHILLS WITH OR WITHOUT FEVER  NAUSEA AND VOMITING THAT IS NOT CONTROLLED WITH YOUR NAUSEA MEDICATION  *UNUSUAL SHORTNESS OF BREATH  *UNUSUAL BRUISING OR BLEEDING  TENDERNESS IN MOUTH AND THROAT WITH OR WITHOUT PRESENCE OF ULCERS  *URINARY PROBLEMS  *BOWEL PROBLEMS  UNUSUAL RASH Items with * indicate a potential emergency and should be followed up as soon as possible.  Feel free to call the clinic should you have any questions or concerns. The clinic phone number is [phone].  Please show the [facility] at check-in to the Emergency Department and triage nurse.

## 2019-03-21 NOTE — Progress Notes (Signed)
Per Dr Irene Limbo proceed with treatment with atezolizumab today with low BP.  Pt will also be getting 1L of NS.

## 2019-03-21 NOTE — Progress Notes (Signed)
Met w/ pt and he needs assistance w/ Eliquis so I completed the application w/ [employer], got signatures from the pt and Dr. Irene Limbo and faxed the application along with the required docs needed for processing today.  Once I get the outcome I will notify the pt.  Pt was already approved for the $700 [place].

## 2019-03-22 ENCOUNTER — Telehealth: Payer: Self-pay | Admitting: *Deleted

## 2019-03-22 ENCOUNTER — Telehealth: Payer: Self-pay | Admitting: Hematology

## 2019-03-22 NOTE — Telephone Encounter (Signed)
Scheduled appt per 11/11 los.  Spoke with pt and he is aware of his appt date and time.

## 2019-03-22 NOTE — Telephone Encounter (Signed)
Patient called - had Tecentriq yesterday AM. Began having chills, joint pain, back pain and headache later in afternoon. Did not take any pain meds or tylenol. States he is wrapped up in blankets. Denies fever. Wanted to know what to do. Dr. Irene Limbo given information. Advised patient to take pain meds as ordered if needed for joint pain, and can take tylenol as needed (per pkg directions) for headache. Encouraged him to drink fluids to stay hydrated.  Also Dr. Irene Limbo recommends high calorie diet with nutrient dense foods. Patient verbalized understanding of directions and will contact office if no improvement

## 2019-03-26 MED FILL — ELIQUIS 5 MG TABLET: 5 | 30 days supply | Qty: 60 | Fill #1

## 2019-03-27 ENCOUNTER — Encounter: Payer: Self-pay | Admitting: Hematology

## 2019-03-27 NOTE — Progress Notes (Signed)
HEMATOLOGY/ONCOLOGY CONSULTATION NOTE  Date of Service: 03/28/2019  Patient Care Team: [name], MD as PCP - General (Family Medicine)  CHIEF COMPLAINTS/PURPOSE OF CONSULTATION:  contnued management of Metastatic Poorly differentiated lung adenocarcinoma  HISTORY OF PRESENTING ILLNESS:   David Berry is a 53 year old male with no significant medical history.  He presented to the hospital with progressive right arm and chest pain with new right-sided neck swelling.  The patient states that he was diagnosed with carpal tunnel syndrome received an injection in August.  However, he continued to have progressive pain up and down his right arm that would also radiate to his right chest.  He took ibuprofen with no improvement.  1 week prior to admission, he developed new right sided neck swelling.  He presented to the emergency room for further evaluation.  Work-up in the emergency room was significant for a CT angiogram of the neck and chest which revealed a bulky soft tissue mass in the right neck continuing into the upper chest most compatible with extensive metastatic lymphadenopathy.  This mass measures up to 34 mm and encases multiple vessels including the right upper lobe pulmonary vessels, superior vena cava, and brachiocephalic vessel.  He also had a large poorly defined malignant appearing infiltrative mass measuring 6.2 x 5.6 x 7.9 cm within the right anterior and middle mediastinum with suspected invasion of the distal left brachiocephalic vein and probable tumor occlusion of the right subclavian and jugular veins.  There is also suspected tumor invasion of the upper SVC with string-like narrowing of the SVC but with patency of the SVC just above the right atrium.  The tumor abuts the aorta and great vessels and also results in significant narrowing of the right upper lobe pulmonary arterial vessels.  He also had a CT of the abdomen pelvis which showed a 15 mm heterogeneously enhancing  lesion in the upper pole of the right kidney concerning for renal cell carcinoma, no lymphadenopathy in the abdomen or pelvis, focal hyperenhancement of the anterior left liver most likely related to aberrant venous anatomy/flow, sclerotic lesions in the right acetabulum and left iliac bone are indeterminate.  These are likely bone islands but attention on follow-up is recommended a metastatic disease cannot be excluded.  MRI of the brain with and without contrast did not show any evidence of intracranial metastatic disease. The patient underwent a CT-guided biopsy in interventional radiology on 01/22/2019.  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   When seen today, patient reports ongoing pain and swelling to his right arm and right side of his neck.  Reports ongoing pain to his right chest.  He reports intermittent facial swelling which is worse in the morning and improves throughout the day.  He reports anorexia and a weight loss of 20 pounds over the past few weeks. He has had dizziness for the past few months.  Denies headaches.  He also noted that on the day of admission his peripheral vision was poor.  His vision is now improved.  He denies food getting stuck or difficulty swallowing but does feel as though his throat is "tight."  He reports a nonproductive cough.  Denies fevers and chills.  He is not really having any shortness of breath.  Denies abdominal pain, nausea, vomiting, constipation, diarrhea.  He has not noticed any epistaxis, hemoptysis, hematemesis, hematuria, melena, hematochezia.  The patient is single.  He has 1 daughter who lives  in [place], [place].  Reports occasional alcohol use and currently smokes 3 cigars a day since 1997.  He is currently living in a rooming house with other roommates who all have their own room.  They share a common bathroom and kitchen.  Medical oncology was asked see  the patient to make recommendations regarding his newly diagnosed poorly differentiated carcinoma.  History reviewed. No pertinent past medical history.  Current Treatment:  Atezolizumab with plan to start carbo/taxol  INTERVAL HISTORY:   David Hatchel. is a wonderful 53 y.o. male who is here for evaluation and management of newly diagnosed metastatic poorly differentiated lung adenocarcinoma. The patient's last visit with [place] was on 03/07/2019. The pt reports that he is doing well overall.  He states that he has had chest pain for the past few weeks. The pain is on the left side and he said it is feels like palpitations.  He states he has small bumps in his mouth, on his head, inner thigh, and arms they do not itch. He was told by neighbor to take ibupropion for the rashes. Rashes are not visible at this time.  He is still no longer using the fentanyl patches.  He has decreased the amount of oxycodone he is taking  He states that he has chest tightness that comes and goes. It feel like heartburn.  Lab results today (03/28/19) of CBC w/diff and CMP is as follows: all values are WNL except for Hemoglobin at 12.1, HCT at 37.2, RDW at 17.5, Albumin at 3.1, AST at 12, Total Bilirubin at <0.2  On review of systems, pt reports tightness in his chest  and denies diarrhea, problems with his diets, abdominal pain and any other symptoms.    MEDICAL HISTORY:  Past Medical History:  Diagnosis Date   Blood in stool    Cancer ([place])    Chronic bronchitis with emphysema (HCC)    GERD (gastroesophageal reflux disease)     SURGICAL HISTORY: Past Surgical History:  Procedure Laterality Date   APPENDECTOMY     teenager   INGUINAL HERNIA REPAIR Right 2016, 2017   [facility], 2018 [facility]-removed mesh    SOCIAL HISTORY: Social History   Socioeconomic History   Marital status: Single    Spouse name: Not on file   Number of children: 1   Years of education: GED    Highest education level: Not on file  Occupational History    Comment: [employer]  Social Designer, fashion/clothing strain: Not on file   Food insecurity    Worry: Not on file    Inability: Not on file   Transportation needs    Medical: Not on file    Non-medical: Not on file  Tobacco Use   Smoking status: Former Smoker    Types: Cigars   Smokeless tobacco: Never Used   Tobacco comment: smokes 3 black and mild cigars per day  Substance and Sexual Activity   Alcohol use: Not Currently   Drug use: No   Sexual activity: Yes  Lifestyle   Physical activity    Days per week: Not on file    Minutes per session: Not on file   Stress: Not on file  Relationships   Social connections    Talks on phone: Not on file    Gets together: Not on file    Attends religious service: Not on file    Active member of club or organization: Not on file  Attends meetings of clubs or organizations: Not on file    Relationship status: Not on file   Intimate partner violence    Fear of current or ex partner: Not on file    Emotionally abused: Not on file    Physically abused: Not on file    Forced sexual activity: Not on file  Other Topics Concern   Not on file  Social History Narrative   Lives alone   Caffeine- coffee, 20 oz daily, tea occas    FAMILY HISTORY: Family History  Problem Relation Age of Onset   Prostate cancer Father     ALLERGIES:  is allergic to pregabalin.  MEDICATIONS:  Current Outpatient Medications  Medication Sig Dispense Refill   apixaban (ELIQUIS) 5 MG TABS tablet Take 1 tablet (5 mg total) by mouth 2 (two) times daily. 60 tablet 5   fentaNYL (DURAGESIC) 12 MCG/HR Place 1 patch onto the skin every 3 (three) days. (Patient not taking: Reported on 02/22/2019) 10 patch 0   magic mouthwash w/lidocaine SOLN Take 5 mLs by mouth 4 (four) times daily as needed for mouth pain. Swish in the mouth and swallow for radiation related throat pain 480 mL  1   ondansetron (ZOFRAN) 8 MG tablet Take 1 tablet (8 mg total) by mouth 2 (two) times daily as needed for refractory nausea / vomiting. Start on day 3 after carboplatin chemo. 30 tablet 1   oxyCODONE (OXY IR/ROXICODONE) 5 MG immediate release tablet Take 1-2 tablets (5-10 mg total) by mouth every 4 (four) hours as needed for severe pain. 60 tablet 0   prochlorperazine (COMPAZINE) 10 MG tablet Take 1 tablet (10 mg total) by mouth every 6 (six) hours as needed (Nausea or vomiting). 30 tablet 1   No current facility-administered medications for this visit.     REVIEW OF SYSTEMS:   A 10+ POINT REVIEW OF SYSTEMS WAS OBTAINED including neurology, dermatology, psychiatry, cardiac, respiratory, lymph, extremities, GI, GU, Musculoskeletal, constitutional, breasts, reproductive, HEENT.  All pertinent positives are noted in the HPI.  All others are negative.    PHYSICAL EXAMINATION: ECOG FS:1 - Symptomatic but completely ambulatory  Vitals:   03/28/19 1004  BP: 99/77  Pulse: (!) 102  Resp: 18  Temp: 98.7 F (37.1 C)  SpO2: 100%   Wt Readings from Last 3 Encounters:  03/28/19 126 lb 8 oz (57.4 kg)  03/21/19 126 lb 1.6 oz (57.2 kg)  03/06/19 123 lb 12.8 oz (56.2 kg)   Body mass index is 17.64 kg/m.    GENERAL:alert, in no acute distress and comfortable SKIN: no acute rashes, no significant lesions EYES: conjunctiva are pink and non-injected, sclera anicteric OROPHARYNX: MMM, no exudates, no oropharyngeal erythema or ulceration NECK: supple, no JVD LYMPH:  no palpable lymphadenopathy in the cervical, axillary or inguinal regions LUNGS: clear to auscultation b/l with normal respiratory effort HEART: regular rate & rhythm ABDOMEN:  normoactive bowel sounds , non tender, not distended. Extremity: no pedal edema PSYCH: alert & oriented x 3 with fluent speech NEURO: no focal motor/sensory deficits  LABORATORY DATA:  I have reviewed the data as listed  . CBC Latest Ref Rng & Units  03/28/2019 03/21/2019 02/26/2019  WBC 4.0 - 10.5 K/uL 4.5 5.0 5.6  Hemoglobin 13.0 - 17.0 g/dL 12.1(L) 12.7(L) 12.2(L)  Hematocrit 39.0 - 52.0 % 37.2(L) 38.5(L) 37.3(L)  Platelets 150 - 400 K/uL 389 219 285    . CMP Latest Ref Rng & Units 03/28/2019 03/21/2019 02/26/2019  Glucose 70 -  99 mg/dL 95 85 107(H)  BUN 6 - 20 mg/dL _0 Creatinine 0.61 - 1.24 mg/dL 0.82 0.83 0.81  Sodium 135 - 145 mmol/L 138 137 138  Potassium 3.5 - 5.1 mmol/L 4.1 4.3 3.7  Chloride 98 - 111 mmol/L 103 103 102  CO2 22 - 32 mmol/L _1 Calcium 8.9 - 10.3 mg/dL 9.6 9.5 9.5  Total Protein 6.5 - 8.1 g/dL 7.4 7.2 6.9  Total Bilirubin 0.3 - 1.2 mg/dL <0.2(L) 0.3 0.2(L)  Alkaline Phos 38 - 126 U/L 118 116 108  AST 15 - 41 U/L 12(L) 10(L) 13(L)  ALT 0 - 44 U/L _2 01/22/2019 [facility]: Tumor Mutational Burden     01/22/2019 PD-L1 Immunohistochemistry Analysis    01/22/2019 Soft Tissue Needle Core Biopsy Surgical Pathology     RADIOGRAPHIC STUDIES: I have personally reviewed the radiological images as listed and agreed with the findings in the report. No results found.  ASSESSMENT & PLAN:   This is a 53 year old malewith  1. 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 Acute DVT- Right IJ, Right Innominate Vein, and likely also the SVC- related to malignancy+ tobacco-  on lovenox  4. Symptomatic hemorrhoids  5. Normocytic anemia -Likely due to underlying malignancy -Will check ferritin, iron studies, vitamin B12 level, and RBC folate in the a.m.  6.Thrombocytosis -- likely due to paraneoplastic effect of tumor and reactive due to inflammation and tissue inflammation from  RT.-- now resolved.  7. Nicotine dependence -He was counseled about tobacco cessation.  PLAN:  -Discussed pt labwork today, 03/28/19;  CBC w/diff and CMP is as follows: all values are WNL except for Hemoglobin at 12.1, HCT at 37.2, RDW at 17.5, Albumin at 3.1, AST at 12, Total Bilirubin at <0.2 -Discussed importance of maintaining compliance with Eliquis. -Discussed medications for heartburn -Discussed using over the counter antihistamine like benadryl or zyrtec for any minor skin rashes and to inform us as well. Also gave him the option to prescribe something similar to benadryl. Pt wants to use over the counter benadryl. -Discussed inserting a port a cath. Pt wants to schedule the appoint as soon as possible. Advised that originally we were going to hold off on inserting the port a cath due to the need to hold blood thinners for 48hr, which could complicates things for him. Pt decided to wait to have port a cath inserted. -Discussed maintaining healthy diet -Advised pt to stay active by walking while maintaining safety protocols - Advised that labs were stable  FOLLOW UP: F/u as per scheduled appointments for C2 of chemo-immunotherapy with labs and MD visit   The total time spent in the appt was 25 minutes and more than 50% was on counseling and direct patient cares.  All of the patient's questions were answered with apparent satisfaction. The patient knows to call the clinic with any problems, questions or concerns.  Sullivan Lone MD MS AAHIVMS [facility] Hematology/Oncology Physician [facility]  (Office):       [phone] (Work cell):  [phone] (Fax):           [phone]  03/27/2019 9:12 PM  I, Scot Dock, am acting as a scribe for Dr.  Sullivan Lone.   .I have reviewed the above documentation for accuracy and completeness, and I agree with the above. Brunetta Genera MD

## 2019-03-27 NOTE — Progress Notes (Signed)
Pt was approved w/ [employer] to receive Eliquis free of charge from 03/27/19 through 03/26/20.  Shipments of Eliquis will be sent directly to the pt every 30 days for the number of refills the dr requested, or through the end of the enrollment period (whichever is earlier).  I called pt to notify him of the approval but [employer] will also notify the pt by letter.

## 2019-03-28 ENCOUNTER — Inpatient Hospital Stay (HOSPITAL_BASED_OUTPATIENT_CLINIC_OR_DEPARTMENT_OTHER): Payer: Medicaid Other | Admitting: Hematology

## 2019-03-28 ENCOUNTER — Other Ambulatory Visit: Payer: Self-pay

## 2019-03-28 ENCOUNTER — Telehealth: Payer: Self-pay | Admitting: Hematology

## 2019-03-28 ENCOUNTER — Inpatient Hospital Stay: Payer: Medicaid Other

## 2019-03-28 ENCOUNTER — Encounter: Payer: Self-pay | Admitting: Radiation Oncology

## 2019-03-28 VITALS — BP 99/77 | HR 102 | Temp 98.7°F | Resp 18 | Ht 71.0 in | Wt 126.5 lb

## 2019-03-28 DIAGNOSIS — Z5112 Encounter for antineoplastic immunotherapy: Secondary | ICD-10-CM | POA: Diagnosis not present

## 2019-03-28 DIAGNOSIS — I82621 Acute embolism and thrombosis of deep veins of right upper extremity: Secondary | ICD-10-CM

## 2019-03-28 DIAGNOSIS — C3491 Malignant neoplasm of unspecified part of right bronchus or lung: Secondary | ICD-10-CM

## 2019-03-28 DIAGNOSIS — K1233 Oral mucositis (ulcerative) due to radiation: Secondary | ICD-10-CM

## 2019-03-28 DIAGNOSIS — G893 Neoplasm related pain (acute) (chronic): Secondary | ICD-10-CM | POA: Diagnosis not present

## 2019-03-28 DIAGNOSIS — C3411 Malignant neoplasm of upper lobe, right bronchus or lung: Secondary | ICD-10-CM | POA: Diagnosis not present

## 2019-03-28 LAB — CBC WITH DIFFERENTIAL/PLATELET
Abs Immature Granulocytes: 0.01 10*3/uL (ref 0.00–0.07)
Basophils Absolute: 0 10*3/uL (ref 0.0–0.1)
Basophils Relative: 0 %
Eosinophils Absolute: 0.1 10*3/uL (ref 0.0–0.5)
Eosinophils Relative: 1 %
HCT: 37.2 % — ABNORMAL LOW (ref 39.0–52.0)
Hemoglobin: 12.1 g/dL — ABNORMAL LOW (ref 13.0–17.0)
Immature Granulocytes: 0 %
Lymphocytes Relative: 17 %
Lymphs Abs: 0.8 10*3/uL (ref 0.7–4.0)
MCH: 28.5 pg (ref 26.0–34.0)
MCHC: 32.5 g/dL (ref 30.0–36.0)
MCV: 87.5 fL (ref 80.0–100.0)
Monocytes Absolute: 0.6 10*3/uL (ref 0.1–1.0)
Monocytes Relative: 13 %
Neutro Abs: 3.1 10*3/uL (ref 1.7–7.7)
Neutrophils Relative %: 69 %
Platelets: 389 10*3/uL (ref 150–400)
RBC: 4.25 MIL/uL (ref 4.22–5.81)
RDW: 17.5 % — ABNORMAL HIGH (ref 11.5–15.5)
WBC: 4.5 10*3/uL (ref 4.0–10.5)
nRBC: 0 % (ref 0.0–0.2)

## 2019-03-28 LAB — CMP (CANCER CENTER ONLY)
ALT: 15 U/L (ref 0–44)
AST: 12 U/L — ABNORMAL LOW (ref 15–41)
Albumin: 3.1 g/dL — ABNORMAL LOW (ref 3.5–5.0)
Alkaline Phosphatase: 118 U/L (ref 38–126)
Anion gap: 12 (ref 5–15)
BUN: 8 mg/dL (ref 6–20)
CO2: 23 mmol/L (ref 22–32)
Calcium: 9.6 mg/dL (ref 8.9–10.3)
Chloride: 103 mmol/L (ref 98–111)
Creatinine: 0.82 mg/dL (ref 0.61–1.24)
GFR, Est AFR Am: >60 mL/min (ref >60)
GFR, Estimated: >60 mL/min (ref >60)
Glucose, Bld: 95 mg/dL (ref 70–99)
Potassium: 4.1 mmol/L (ref 3.5–5.1)
Sodium: 138 mmol/L (ref 135–145)
Total Bilirubin: <0.2 mg/dL — ABNORMAL LOW (ref 0.3–1.2)
Total Protein: 7.4 g/dL (ref 6.5–8.1)

## 2019-03-28 LAB — MAGNESIUM: Magnesium: 1.9 mg/dL (ref 1.7–2.4)

## 2019-03-28 MED ORDER — ESOMEPRAZOLE MAGNESIUM 40 MG PO CPDR: 40.0000 mg | DELAYED_RELEASE_CAPSULE | Freq: Every day | ORAL | 1 refills | Status: DC

## 2019-03-28 NOTE — Telephone Encounter (Signed)
Per 11/18 los F/u as per scheduled appointments for C2 of chemo-immunotherapy with labs and MD visit

## 2019-04-02 ENCOUNTER — Telehealth: Payer: Self-pay | Admitting: *Deleted

## 2019-04-02 NOTE — Telephone Encounter (Signed)
On 04/02/19 faxed medical records to disability determination services, it was consult note, sim & planning note

## 2019-04-09 NOTE — Progress Notes (Signed)
HEMATOLOGY/ONCOLOGY CLINIC NOTE  Date of Service: 04/10/2019  Patient Care Team: [name], MD as PCP - General (Family Medicine)  CHIEF COMPLAINTS/PURPOSE OF CONSULTATION:  contnued management of Metastatic Poorly differentiated lung adenocarcinoma  HISTORY OF PRESENTING ILLNESS:   [name] is a 53 year old male with no significant medical history.  He presented to the hospital with progressive right arm and chest pain with new right-sided neck swelling.  The patient states that he was diagnosed with carpal tunnel syndrome received an injection in August.  However, he continued to have progressive pain up and down his right arm that would also radiate to his right chest.  He took ibuprofen with no improvement.  1 week prior to admission, he developed new right sided neck swelling.  He presented to the emergency room for further evaluation.  Work-up in the emergency room was significant for a CT angiogram of the neck and chest which revealed a bulky soft tissue mass in the right neck continuing into the upper chest most compatible with extensive metastatic lymphadenopathy.  This mass measures up to 34 mm and encases multiple vessels including the right upper lobe pulmonary vessels, superior vena cava, and brachiocephalic vessel.  He also had a large poorly defined malignant appearing infiltrative mass measuring 6.2 x 5.6 x 7.9 cm within the right anterior and middle mediastinum with suspected invasion of the distal left brachiocephalic vein and probable tumor occlusion of the right subclavian and jugular veins.  There is also suspected tumor invasion of the upper SVC with string-like narrowing of the SVC but with patency of the SVC just above the right atrium.  The tumor abuts the aorta and great vessels and also results in significant narrowing of the right upper lobe pulmonary arterial vessels.  He also had a CT of the abdomen pelvis which showed a 15 mm heterogeneously enhancing lesion  in the upper pole of the right kidney concerning for renal cell carcinoma, no lymphadenopathy in the abdomen or pelvis, focal hyperenhancement of the anterior left liver most likely related to aberrant venous anatomy/flow, sclerotic lesions in the right acetabulum and left iliac bone are indeterminate.  These are likely bone islands but attention on follow-up is recommended a metastatic disease cannot be excluded.  MRI of the brain with and without contrast did not show any evidence of intracranial metastatic disease. The patient underwent a CT-guided biopsy in interventional radiology on 01/22/2019.  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   When seen today, patient reports ongoing pain and swelling to his right arm and right side of his neck.  Reports ongoing pain to his right chest.  He reports intermittent facial swelling which is worse in the morning and improves throughout the day.  He reports anorexia and a weight loss of 20 pounds over the past few weeks. He has had dizziness for the past few months.  Denies headaches.  He also noted that on the day of admission his peripheral vision was poor.  His vision is now improved.  He denies food getting stuck or difficulty swallowing but does feel as though his throat is "tight."  He reports a nonproductive cough.  Denies fevers and chills.  He is not really having any shortness of breath.  Denies abdominal pain, nausea, vomiting, constipation, diarrhea.  He has not noticed any epistaxis, hemoptysis, hematemesis, hematuria, melena, hematochezia.  The patient is single.  He has 1 daughter who lives  in [place], [place].  Reports occasional alcohol use and currently smokes 3 cigars a day since 1997.  He is currently living in a rooming house with other roommates who all have their own room.  They share a common bathroom and kitchen.  Medical oncology was asked see the  patient to make recommendations regarding his newly diagnosed poorly differentiated carcinoma.  History reviewed. No pertinent past medical history.  Current Treatment:  Atezolizumab with plan to start carbo/taxol  INTERVAL HISTORY:   David Berry. is a  53 y.o. male who is here for evaluation and management of metastatic poorly differentiated lung adenocarcinoma. He is here for C2 of Atezolizumab, Carboplatin and Taxol. The patient's last visit with [place] was on 03/28/2019. The pt reports that he is doing well overall.  The pt reports that he has been eating well and feeling better. He denies any bowel movement issues, urinary issues or problems swallowing. He has continued to take Lorazepam at night to help him get to sleep as he sometimes has chills and body aches. Pt also notes that his hives have resolved. Pt sometimes has chest discomfort/pain and heart palpitations. Pt no longer needs to use his Oxycodone or Fentanyl Patch for pain relief. He is having no problems taking his Eliquis.  Lab results today (04/10/19) of CBC w/diff and CMP is as follows: all values are WNL except for RBC at 4.21, Hgb at 11.9, HCT at 36.9, RDW at 17.0, Albumin at 3.1, AST at 13, Total Bilirubin at <0.2. 04/10/2019 Magnesium at 2.0  On review of systems, pt reports eating well, chills, body aches, chest pain/discomfort, heart palpitations and denies diarrhea, bowel movement issues, urinary issues, problems swallowing, mouth sores, neck discomfort, abdominal pain, SOB and any other symptoms.   MEDICAL HISTORY:  Past Medical History:  Diagnosis Date   Blood in stool    Cancer ([place])    Chronic bronchitis with emphysema (HCC)    GERD (gastroesophageal reflux disease)     SURGICAL HISTORY: Past Surgical History:  Procedure Laterality Date   APPENDECTOMY     teenager   INGUINAL HERNIA REPAIR Right 2016, 2017   [facility], 2018 [facility]-removed mesh    SOCIAL HISTORY: Social History    Socioeconomic History   Marital status: Single    Spouse name: Not on file   Number of children: 1   Years of education: GED   Highest education level: Not on file  Occupational History    Comment: [employer]  Social Designer, fashion/clothing strain: Not on file   Food insecurity    Worry: Not on file    Inability: Not on file   Transportation needs    Medical: Not on file    Non-medical: Not on file  Tobacco Use   Smoking status: Former Smoker    Types: Cigars   Smokeless tobacco: Never Used   Tobacco comment: smokes 3 black and mild cigars per day  Substance and Sexual Activity   Alcohol use: Not Currently   Drug use: No   Sexual activity: Yes  Lifestyle   Physical activity    Days per week: Not on file    Minutes per session: Not on file   Stress: Not on file  Relationships   Social connections    Talks on phone: Not on file    Gets together: Not on file    Attends religious service: Not on file    Active member of club  or organization: Not on file    Attends meetings of clubs or organizations: Not on file    Relationship status: Not on file   Intimate partner violence    Fear of current or ex partner: Not on file    Emotionally abused: Not on file    Physically abused: Not on file    Forced sexual activity: Not on file  Other Topics Concern   Not on file  Social History Narrative   Lives alone   Caffeine- coffee, 20 oz daily, tea occas    FAMILY HISTORY: Family History  Problem Relation Age of Onset   Prostate cancer Father     ALLERGIES:  is allergic to pregabalin.  MEDICATIONS:  Current Outpatient Medications  Medication Sig Dispense Refill   apixaban (ELIQUIS) 5 MG TABS tablet Take 1 tablet (5 mg total) by mouth 2 (two) times daily. 60 tablet 5   esomeprazole (NEXIUM) 40 MG capsule Take 1 capsule (40 mg total) by mouth daily before breakfast. 30 capsule 1   fentaNYL (DURAGESIC) 12 MCG/HR Place 1 patch onto the skin  every 3 (three) days. (Patient not taking: Reported on 02/22/2019) 10 patch 0   magic mouthwash w/lidocaine SOLN Take 5 mLs by mouth 4 (four) times daily as needed for mouth pain. Swish in the mouth and swallow for radiation related throat pain 480 mL 1   ondansetron (ZOFRAN) 8 MG tablet Take 1 tablet (8 mg total) by mouth 2 (two) times daily as needed for refractory nausea / vomiting. Start on day 3 after carboplatin chemo. 30 tablet 1   oxyCODONE (OXY IR/ROXICODONE) 5 MG immediate release tablet Take 1-2 tablets (5-10 mg total) by mouth every 4 (four) hours as needed for severe pain. 60 tablet 0   prochlorperazine (COMPAZINE) 10 MG tablet Take 1 tablet (10 mg total) by mouth every 6 (six) hours as needed (Nausea or vomiting). 30 tablet 1   No current facility-administered medications for this visit.     REVIEW OF SYSTEMS:   A 10+ POINT REVIEW OF SYSTEMS WAS OBTAINED including neurology, dermatology, psychiatry, cardiac, respiratory, lymph, extremities, GI, GU, Musculoskeletal, constitutional, breasts, reproductive, HEENT.  All pertinent positives are noted in the HPI.  All others are negative.   PHYSICAL EXAMINATION: ECOG FS:1 - Symptomatic but completely ambulatory  Vitals:   04/10/19 1152  BP: 96/77  Pulse: 95  Resp: 18  Temp: 98.3 F (36.8 C)  SpO2: 100%   Wt Readings from Last 3 Encounters:  04/10/19 130 lb 14.4 oz (59.4 kg)  03/28/19 126 lb 8 oz (57.4 kg)  03/21/19 126 lb 1.6 oz (57.2 kg)   Body mass index is 18.26 kg/m.    GENERAL:alert, in no acute distress and comfortable SKIN: no acute rashes, no significant lesions EYES: conjunctiva are pink and non-injected, sclera anicteric OROPHARYNX: MMM, no exudates, no oropharyngeal erythema or ulceration NECK: supple, no JVD LYMPH:  no palpable lymphadenopathy in the cervical, axillary or inguinal regions LUNGS: clear to auscultation b/l with normal respiratory effort HEART: regular rate & rhythm ABDOMEN:  normoactive  bowel sounds , non tender, not distended. No palpable hepatosplenomegaly.  Extremity: no pedal edema PSYCH: alert & oriented x 3 with fluent speech NEURO: no focal motor/sensory deficits  LABORATORY DATA:  I have reviewed the data as listed  . CBC Latest Ref Rng & Units 04/10/2019 03/28/2019 03/21/2019  WBC 4.0 - 10.5 K/uL 5.1 4.5 5.0  Hemoglobin 13.0 - 17.0 g/dL 11.9(L) 12.1(L) 12.7(L)  Hematocrit 39.0 -  52.0 % 36.9(L) 37.2(L) 38.5(L)  Platelets 150 - 400 K/uL 352 389 219    . CMP Latest Ref Rng & Units 04/10/2019 03/28/2019 03/21/2019  Glucose 70 - 99 mg/dL 97 95 85  BUN 6 - 20 mg/dL _0 Creatinine 0.61 - 1.24 mg/dL 0.76 0.82 0.83  Sodium 135 - 145 mmol/L 138 138 137  Potassium 3.5 - 5.1 mmol/L 4.2 4.1 4.3  Chloride 98 - 111 mmol/L 105 103 103  CO2 22 - 32 mmol/L _1 Calcium 8.9 - 10.3 mg/dL 9.2 9.6 9.5  Total Protein 6.5 - 8.1 g/dL 7.0 7.4 7.2  Total Bilirubin 0.3 - 1.2 mg/dL <0.2(L) <0.2(L) 0.3  Alkaline Phos 38 - 126 U/L 103 118 116  AST 15 - 41 U/L 13(L) 12(L) 10(L)  ALT 0 - 44 U/L _2 01/22/2019 [facility]: Tumor Mutational Burden     01/22/2019 PD-L1 Immunohistochemistry Analysis    01/22/2019 Soft Tissue Needle Core Biopsy Surgical Pathology     RADIOGRAPHIC STUDIES: I have personally reviewed the radiological images as listed and agreed with the findings in the report. No results found.  ASSESSMENT & PLAN:   This is a 53 year old malewith  1. 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 Acute DVT- Right IJ, Right Innominate Vein, and likely also the SVC- related to malignancy+ tobacco-  on lovenox  4. Symptomatic  hemorrhoids  5. Normocytic anemia -Likely due to underlying malignancy -Will check ferritin, iron studies, vitamin B12 level, and RBC folate in the a.m.  6.Thrombocytosis -- likely due to paraneoplastic effect of tumor and reactive due to inflammation and tissue inflammation from RT.-- now resolved.  7. Nicotine dependence -He was counseled about tobacco cessation.  PLAN:  -Discussed pt labwork today, 04/10/19; blood counts and chemistries are good.  -Discussed 04/10/2019 Magnesium looks good at 2.0 -The pt has no prohibitive toxicities from continuing C2 of Atezolizumab, Carboplatin and Taxol at this time. -Advised pt that Neulasta can cause bone pain for 1-2 days - pt can take a Tylenol or Oxycodone if necessary -Continue Eliquis -Recommend OTC Claritin or Zyrtec if hives return -Will repeat PET/CT after C3 -Will see back in 3 weeks -Pt advised to contact for any concerns or changes in symptomology before next visit -patient having issues with IV lines and is now agreeable to port a cath placement  FOLLOW UP: Please schedule C3 of chemo-immunotherapy with labs , portflush and MD visit.   The total time spent in the appt was 25 minutes and more than 50% was on counseling and direct patient cares.  All of the patient's questions were answered with apparent satisfaction. The patient knows to call the clinic with any problems, questions or concerns.  Sullivan Lone MD [place] AAHIVMS [facility] Hematology/Oncology Physician [facility]  (Office):       [phone] (Work cell):  [phone] (Fax):           [phone]  04/10/2019 12:49 PM  I, Yevette Edwards, am acting as a scribe for Dr. Sullivan Lone.   .I have reviewed the above documentation for accuracy and completeness, and I agree with the above. Brunetta Genera MD

## 2019-04-10 ENCOUNTER — Inpatient Hospital Stay: Payer: Medicaid Other | Attending: Hematology

## 2019-04-10 ENCOUNTER — Inpatient Hospital Stay: Payer: Medicaid Other

## 2019-04-10 ENCOUNTER — Inpatient Hospital Stay (HOSPITAL_BASED_OUTPATIENT_CLINIC_OR_DEPARTMENT_OTHER): Payer: Medicaid Other | Admitting: Hematology

## 2019-04-10 ENCOUNTER — Other Ambulatory Visit: Payer: Self-pay

## 2019-04-10 VITALS — BP 96/77 | HR 95 | Temp 98.3°F | Resp 18 | Ht 71.0 in | Wt 130.9 lb

## 2019-04-10 DIAGNOSIS — Z8042 Family history of malignant neoplasm of prostate: Secondary | ICD-10-CM | POA: Diagnosis not present

## 2019-04-10 DIAGNOSIS — Z5111 Encounter for antineoplastic chemotherapy: Secondary | ICD-10-CM

## 2019-04-10 DIAGNOSIS — C3491 Malignant neoplasm of unspecified part of right bronchus or lung: Secondary | ICD-10-CM

## 2019-04-10 DIAGNOSIS — Z7189 Other specified counseling: Secondary | ICD-10-CM

## 2019-04-10 DIAGNOSIS — Z86718 Personal history of other venous thrombosis and embolism: Secondary | ICD-10-CM | POA: Insufficient documentation

## 2019-04-10 DIAGNOSIS — Z7901 Long term (current) use of anticoagulants: Secondary | ICD-10-CM | POA: Diagnosis not present

## 2019-04-10 DIAGNOSIS — Z79899 Other long term (current) drug therapy: Secondary | ICD-10-CM | POA: Insufficient documentation

## 2019-04-10 DIAGNOSIS — Z5112 Encounter for antineoplastic immunotherapy: Secondary | ICD-10-CM | POA: Insufficient documentation

## 2019-04-10 DIAGNOSIS — C3411 Malignant neoplasm of upper lobe, right bronchus or lung: Secondary | ICD-10-CM | POA: Insufficient documentation

## 2019-04-10 DIAGNOSIS — Z87891 Personal history of nicotine dependence: Secondary | ICD-10-CM | POA: Diagnosis not present

## 2019-04-10 DIAGNOSIS — I82621 Acute embolism and thrombosis of deep veins of right upper extremity: Secondary | ICD-10-CM

## 2019-04-10 DIAGNOSIS — D649 Anemia, unspecified: Secondary | ICD-10-CM | POA: Insufficient documentation

## 2019-04-10 DIAGNOSIS — Z5189 Encounter for other specified aftercare: Secondary | ICD-10-CM | POA: Insufficient documentation

## 2019-04-10 LAB — CBC WITH DIFFERENTIAL/PLATELET
Abs Immature Granulocytes: 0.01 10*3/uL (ref 0.00–0.07)
Basophils Absolute: 0 10*3/uL (ref 0.0–0.1)
Basophils Relative: 1 %
Eosinophils Absolute: 0 10*3/uL (ref 0.0–0.5)
Eosinophils Relative: 1 %
HCT: 36.9 % — ABNORMAL LOW (ref 39.0–52.0)
Hemoglobin: 11.9 g/dL — ABNORMAL LOW (ref 13.0–17.0)
Immature Granulocytes: 0 %
Lymphocytes Relative: 17 %
Lymphs Abs: 0.9 10*3/uL (ref 0.7–4.0)
MCH: 28.3 pg (ref 26.0–34.0)
MCHC: 32.2 g/dL (ref 30.0–36.0)
MCV: 87.6 fL (ref 80.0–100.0)
Monocytes Absolute: 1 10*3/uL (ref 0.1–1.0)
Monocytes Relative: 21 %
Neutro Abs: 3.1 10*3/uL (ref 1.7–7.7)
Neutrophils Relative %: 60 %
Platelets: 352 10*3/uL (ref 150–400)
RBC: 4.21 MIL/uL — ABNORMAL LOW (ref 4.22–5.81)
RDW: 17 % — ABNORMAL HIGH (ref 11.5–15.5)
WBC: 5.1 10*3/uL (ref 4.0–10.5)
nRBC: 0 % (ref 0.0–0.2)

## 2019-04-10 LAB — CMP (CANCER CENTER ONLY)
ALT: 12 U/L (ref 0–44)
AST: 13 U/L — ABNORMAL LOW (ref 15–41)
Albumin: 3.1 g/dL — ABNORMAL LOW (ref 3.5–5.0)
Alkaline Phosphatase: 103 U/L (ref 38–126)
Anion gap: 9 (ref 5–15)
BUN: 7 mg/dL (ref 6–20)
CO2: 24 mmol/L (ref 22–32)
Calcium: 9.2 mg/dL (ref 8.9–10.3)
Chloride: 105 mmol/L (ref 98–111)
Creatinine: 0.76 mg/dL (ref 0.61–1.24)
GFR, Est AFR Am: >60 mL/min
GFR, Estimated: >60 mL/min
Glucose, Bld: 97 mg/dL (ref 70–99)
Potassium: 4.2 mmol/L (ref 3.5–5.1)
Sodium: 138 mmol/L (ref 135–145)
Total Bilirubin: <0.2 mg/dL — ABNORMAL LOW (ref 0.3–1.2)
Total Protein: 7 g/dL (ref 6.5–8.1)

## 2019-04-10 LAB — MAGNESIUM: Magnesium: 2 mg/dL (ref 1.7–2.4)

## 2019-04-10 MED ORDER — SODIUM CHLORIDE 0.9 % IV SOLN
554.5000 mg | Freq: Once | INTRAVENOUS | Status: AC
  Administered 2019-04-10: 550 mg via INTRAVENOUS
  Filled 2019-04-10: qty 55

## 2019-04-10 MED ORDER — FAMOTIDINE IN NACL 20-0.9 MG/50ML-% IV SOLN
INTRAVENOUS | Status: AC
  Filled 2019-04-10: qty 50

## 2019-04-10 MED ORDER — SODIUM CHLORIDE 0.9 % IV SOLN
1200.0000 mg | Freq: Once | INTRAVENOUS | Status: AC
  Administered 2019-04-10: 1200 mg via INTRAVENOUS
  Filled 2019-04-10: qty 20

## 2019-04-10 MED ORDER — SODIUM CHLORIDE 0.9 % IV SOLN
Freq: Once | INTRAVENOUS | Status: AC
  Administered 2019-04-10: 13:00:00 via INTRAVENOUS
  Filled 2019-04-10: qty 250

## 2019-04-10 MED ORDER — DIPHENHYDRAMINE HCL 50 MG/ML IJ SOLN
50.0000 mg | Freq: Once | INTRAMUSCULAR | Status: AC
  Administered 2019-04-10: 50 mg via INTRAVENOUS

## 2019-04-10 MED ORDER — PALONOSETRON HCL INJECTION 0.25 MG/5ML
INTRAVENOUS | Status: AC
  Filled 2019-04-10: qty 5

## 2019-04-10 MED ORDER — SODIUM CHLORIDE 0.9 % IV SOLN
Freq: Once | INTRAVENOUS | Status: AC
  Administered 2019-04-10 (×2): via INTRAVENOUS
  Filled 2019-04-10: qty 5

## 2019-04-10 MED ORDER — SODIUM CHLORIDE 0.9 % IV SOLN
135.0000 mg/m2 | Freq: Once | INTRAVENOUS | Status: AC
  Administered 2019-04-10: 228 mg via INTRAVENOUS
  Filled 2019-04-10: qty 38

## 2019-04-10 MED ORDER — FAMOTIDINE IN NACL 20-0.9 MG/50ML-% IV SOLN
20.0000 mg | Freq: Once | INTRAVENOUS | Status: AC
  Administered 2019-04-10: 20 mg via INTRAVENOUS

## 2019-04-10 MED ORDER — PALONOSETRON HCL INJECTION 0.25 MG/5ML
0.2500 mg | Freq: Once | INTRAVENOUS | Status: AC
  Administered 2019-04-10: 0.25 mg via INTRAVENOUS

## 2019-04-10 MED ORDER — DIPHENHYDRAMINE HCL 50 MG/ML IJ SOLN
INTRAMUSCULAR | Status: AC
  Filled 2019-04-10: qty 1

## 2019-04-10 NOTE — Patient Instructions (Addendum)
[facility] Discharge Instructions for Patients Receiving Chemotherapy  Today you received the following chemotherapy agents:  Atezolizumab, Paclitaxel, Carboplatin  To help prevent nausea and vomiting after your treatment, we encourage you to take your nausea medication as prescribed.   If you develop nausea and vomiting that is not controlled by your nausea medication, call the clinic.   BELOW ARE SYMPTOMS THAT SHOULD BE REPORTED IMMEDIATELY:  *FEVER GREATER THAN 100.5 F  *CHILLS WITH OR WITHOUT FEVER  NAUSEA AND VOMITING THAT IS NOT CONTROLLED WITH YOUR NAUSEA MEDICATION  *UNUSUAL SHORTNESS OF BREATH  *UNUSUAL BRUISING OR BLEEDING  TENDERNESS IN MOUTH AND THROAT WITH OR WITHOUT PRESENCE OF ULCERS  *URINARY PROBLEMS  *BOWEL PROBLEMS  UNUSUAL RASH Items with * indicate a potential emergency and should be followed up as soon as possible.  Feel free to call the clinic should you have any questions or concerns. The clinic phone number is [phone].  Please show the [facility] at check-in to the Emergency Department and triage nurse.

## 2019-04-11 ENCOUNTER — Encounter: Payer: Self-pay | Admitting: Urology

## 2019-04-11 ENCOUNTER — Telehealth: Payer: Self-pay | Admitting: Hematology

## 2019-04-11 ENCOUNTER — Other Ambulatory Visit: Payer: Self-pay | Admitting: Hematology

## 2019-04-11 ENCOUNTER — Ambulatory Visit
Admission: RE | Admit: 2019-04-11 | Discharge: 2019-04-11 | Disposition: A | Discharge status: Home / Self Care | Payer: Self-pay | Source: Ambulatory Visit | Attending: Urology | Admitting: Urology

## 2019-04-11 ENCOUNTER — Other Ambulatory Visit: Payer: Self-pay

## 2019-04-11 DIAGNOSIS — C3491 Malignant neoplasm of unspecified part of right bronchus or lung: Secondary | ICD-10-CM

## 2019-04-11 NOTE — Progress Notes (Signed)
Radiation Oncology         [phone]) [phone] ________________________________  Name: [name] MRN: [id number]  Date: 04/11/2019  DOB: May 13, 1965  Post Treatment Note  CC: [name], MD  [name], MD  Diagnosis:   53 y.o. male with newly diagnosed metastatic NSCLC, poorly differentiated, adenocarcinoma of the RUL with extensive lymphadenopathy in the neck and chest resulting in SVC syndrome.  Interval Since Last Radiation:  6 weeks  01/26/19 - 02/27/19:   The RUL lung/bronchus and involved nodes were treated to 50 Gy in 25 fxs of 2 Gy each, concurrent with systemic chemotherapy with Carboplatin and Taxol.  Narrative:  I spoke with the patient to conduct his routine scheduled 1 month follow up visit via telephone to spare the patient unnecessary potential exposure in the healthcare setting during the current COVID-19 pandemic.  The patient was notified in advance and gave permission to proceed with this visit format. He tolerated his radiation treatment well with only mild fatigue, dysphagia and throat irritation.  His cough improved throughout his course of treatment and he denied hemoptysis, chest pain or increased ShOB.                          On review of systems, the patient states that he is doing very well overall.  He reports complete resolution of the dysphagia and significant improvement in his cough.  He specifically denies chest pain, increased shortness of breath, hemoptysis, fever, chills, significant fatigue or productive cough. He has continued to receive systemic therapy with Carboplatin and Taxol under the care and direction of Dr. Irene Limbo.  Immunotherapy with Tecentriq Huey Bienenstock) was added in 03/2019, now s/p 2 cycles of combination therapy as of 04/10/19.  Currently, the plan is to continue on this current regimen and repeat systemic imaging after the third cycle to assess treatment response and further recommendations for treatment.  ALLERGIES:  is  allergic to pregabalin.  Meds: Current Outpatient Medications  Medication Sig Dispense Refill  . apixaban (ELIQUIS) 5 MG TABS tablet Take 1 tablet (5 mg total) by mouth 2 (two) times daily. 60 tablet 5  . LORazepam (ATIVAN) 0.5 MG tablet Take 0.5 mg by mouth every 4 (four) hours as needed for anxiety.    [name] omeprazole (PRILOSEC) 20 MG capsule Take 20 mg by mouth daily.    [name] oxyCODONE (OXY IR/ROXICODONE) 5 MG immediate release tablet Take 1-2 tablets (5-10 mg total) by mouth every 4 (four) hours as needed for severe pain. 60 tablet 0  . esomeprazole (NEXIUM) 40 MG capsule Take 1 capsule (40 mg total) by mouth daily before breakfast. (Patient not taking: Reported on 04/11/2019) 30 capsule 1  . fentaNYL (DURAGESIC) 12 MCG/HR Place 1 patch onto the skin every 3 (three) days. (Patient not taking: Reported on 02/22/2019) 10 patch 0  . magic mouthwash w/lidocaine SOLN Take 5 mLs by mouth 4 (four) times daily as needed for mouth pain. Swish in the mouth and swallow for radiation related throat pain (Patient not taking: Reported on 04/11/2019) 480 mL 1  . ondansetron (ZOFRAN) 8 MG tablet Take 1 tablet (8 mg total) by mouth 2 (two) times daily as needed for refractory nausea / vomiting. Start on day 3 after carboplatin chemo. (Patient not taking: Reported on 04/11/2019) 30 tablet 1  . prochlorperazine (COMPAZINE) 10 MG tablet Take 1 tablet (10 mg total) by mouth every 6 (six) hours as needed (Nausea or vomiting). (Patient  not taking: Reported on 04/11/2019) 30 tablet 1   No current facility-administered medications for this encounter.     Physical Findings:  vitals were not taken for this visit.   /Unable to assess due to telephone follow up visit format.  Lab Findings: Lab Results  Component Value Date   WBC 5.1 04/10/2019   HGB 11.9 (L) 04/10/2019   HCT 36.9 (L) 04/10/2019   MCV 87.6 04/10/2019   PLT 352 04/10/2019     Radiographic Findings: No results found.  Impression/Plan: 74. 53 y.o. male  with newly diagnosed metastatic NSCLC, poorly differentiated, adenocarcinoma with extensive lymphadenopathy in the neck and chest resulting in SVC syndrome. He appears to have recovered well from the effects of his recent radiotherapy and is currently without complaints.  He continues to tolerate his systemic therapy, with chemo-immunotherapy, well under the care and direction of Dr. Irene Limbo.  We discussed that while we are happy to continue to participate in his care if clinically indicated, at this point, we will plan to see him back on an as-needed basis.  He will continue in routine follow-up with Dr. Irene Limbo for continued management of his systemic disease, his next visit is scheduled for May 02, 2019 for cycle 3 of systemic therapy.  He knows to call at anytime with any questions or concerns related to his previous radiotherapy.     Nicholos Johns, PA-C

## 2019-04-11 NOTE — Telephone Encounter (Signed)
Scheduled appt per 12/1 los.  Spoke with pt and they are aware of the appt date and time.

## 2019-04-12 ENCOUNTER — Other Ambulatory Visit: Payer: Self-pay

## 2019-04-12 ENCOUNTER — Inpatient Hospital Stay: Payer: Medicaid Other

## 2019-04-12 VITALS — BP 104/68 | HR 88 | Temp 98.2°F | Resp 18

## 2019-04-12 DIAGNOSIS — Z7189 Other specified counseling: Secondary | ICD-10-CM

## 2019-04-12 DIAGNOSIS — Z5112 Encounter for antineoplastic immunotherapy: Secondary | ICD-10-CM | POA: Diagnosis not present

## 2019-04-12 DIAGNOSIS — C3491 Malignant neoplasm of unspecified part of right bronchus or lung: Secondary | ICD-10-CM

## 2019-04-12 MED ORDER — PEGFILGRASTIM-CBQV 6 MG/0.6ML ~~LOC~~ SOSY
PREFILLED_SYRINGE | SUBCUTANEOUS | Status: AC
  Filled 2019-04-12: qty 0.6

## 2019-04-12 MED ORDER — PEGFILGRASTIM-CBQV 6 MG/0.6ML ~~LOC~~ SOSY
6.0000 mg | PREFILLED_SYRINGE | Freq: Once | SUBCUTANEOUS | Status: AC
  Administered 2019-04-12: 6 mg via SUBCUTANEOUS

## 2019-04-12 NOTE — Progress Notes (Signed)
Pt returned today for UIdenyca injection and possible antiemedic medication. Patient says he feels fine and has not had any nausea. He does not want the iv medications at this time

## 2019-04-12 NOTE — Patient Instructions (Signed)

## 2019-04-19 ENCOUNTER — Other Ambulatory Visit: Payer: Self-pay | Admitting: Physician Assistant

## 2019-04-20 ENCOUNTER — Other Ambulatory Visit: Payer: Self-pay

## 2019-04-20 ENCOUNTER — Ambulatory Visit (HOSPITAL_COMMUNITY): Payer: Self-pay

## 2019-04-26 ENCOUNTER — Ambulatory Visit (HOSPITAL_COMMUNITY)
Admission: RE | Admit: 2019-04-26 | Discharge: 2019-04-26 | Disposition: A | Discharge status: Home / Self Care | Payer: Medicaid Other | Source: Ambulatory Visit | Attending: Hematology | Admitting: Hematology

## 2019-04-26 ENCOUNTER — Encounter (HOSPITAL_COMMUNITY): Payer: Self-pay

## 2019-04-26 ENCOUNTER — Other Ambulatory Visit: Payer: Self-pay

## 2019-04-26 DIAGNOSIS — Z5111 Encounter for antineoplastic chemotherapy: Secondary | ICD-10-CM | POA: Diagnosis not present

## 2019-04-26 DIAGNOSIS — Z7901 Long term (current) use of anticoagulants: Secondary | ICD-10-CM | POA: Diagnosis not present

## 2019-04-26 DIAGNOSIS — Z87891 Personal history of nicotine dependence: Secondary | ICD-10-CM | POA: Diagnosis not present

## 2019-04-26 DIAGNOSIS — C3490 Malignant neoplasm of unspecified part of unspecified bronchus or lung: Secondary | ICD-10-CM | POA: Diagnosis not present

## 2019-04-26 DIAGNOSIS — K219 Gastro-esophageal reflux disease without esophagitis: Secondary | ICD-10-CM | POA: Diagnosis not present

## 2019-04-26 DIAGNOSIS — C3491 Malignant neoplasm of unspecified part of right bronchus or lung: Secondary | ICD-10-CM

## 2019-04-26 DIAGNOSIS — Z452 Encounter for adjustment and management of vascular access device: Secondary | ICD-10-CM | POA: Diagnosis not present

## 2019-04-26 HISTORY — PX: IR IMAGING GUIDED PORT INSERTION: IMG5740

## 2019-04-26 LAB — CBC
HCT: 38.3 % — ABNORMAL LOW (ref 39.0–52.0)
Hemoglobin: 12.3 g/dL — ABNORMAL LOW (ref 13.0–17.0)
MCH: 28.7 pg (ref 26.0–34.0)
MCHC: 32.1 g/dL (ref 30.0–36.0)
MCV: 89.3 fL (ref 80.0–100.0)
Platelets: 298 10*3/uL (ref 150–400)
RBC: 4.29 MIL/uL (ref 4.22–5.81)
RDW: 17.5 % — ABNORMAL HIGH (ref 11.5–15.5)
WBC: 7.5 10*3/uL (ref 4.0–10.5)
nRBC: 0 % (ref 0.0–0.2)

## 2019-04-26 LAB — PROTIME-INR
INR: 0.9 (ref 0.8–1.2)
Prothrombin Time: 12.4 seconds (ref 11.4–15.2)

## 2019-04-26 IMAGING — US IR IMAGING GUIDED PORT INSERTION
2 series · 2 of 2 positions shown · non-contrast
Comparison: none

INDICATION: 53-year-old with lung cancer.  Port-A-Cath needed for chemotherapy.

[Series 1: (id) · 1 of 1 slices shown]
[im 1/1]
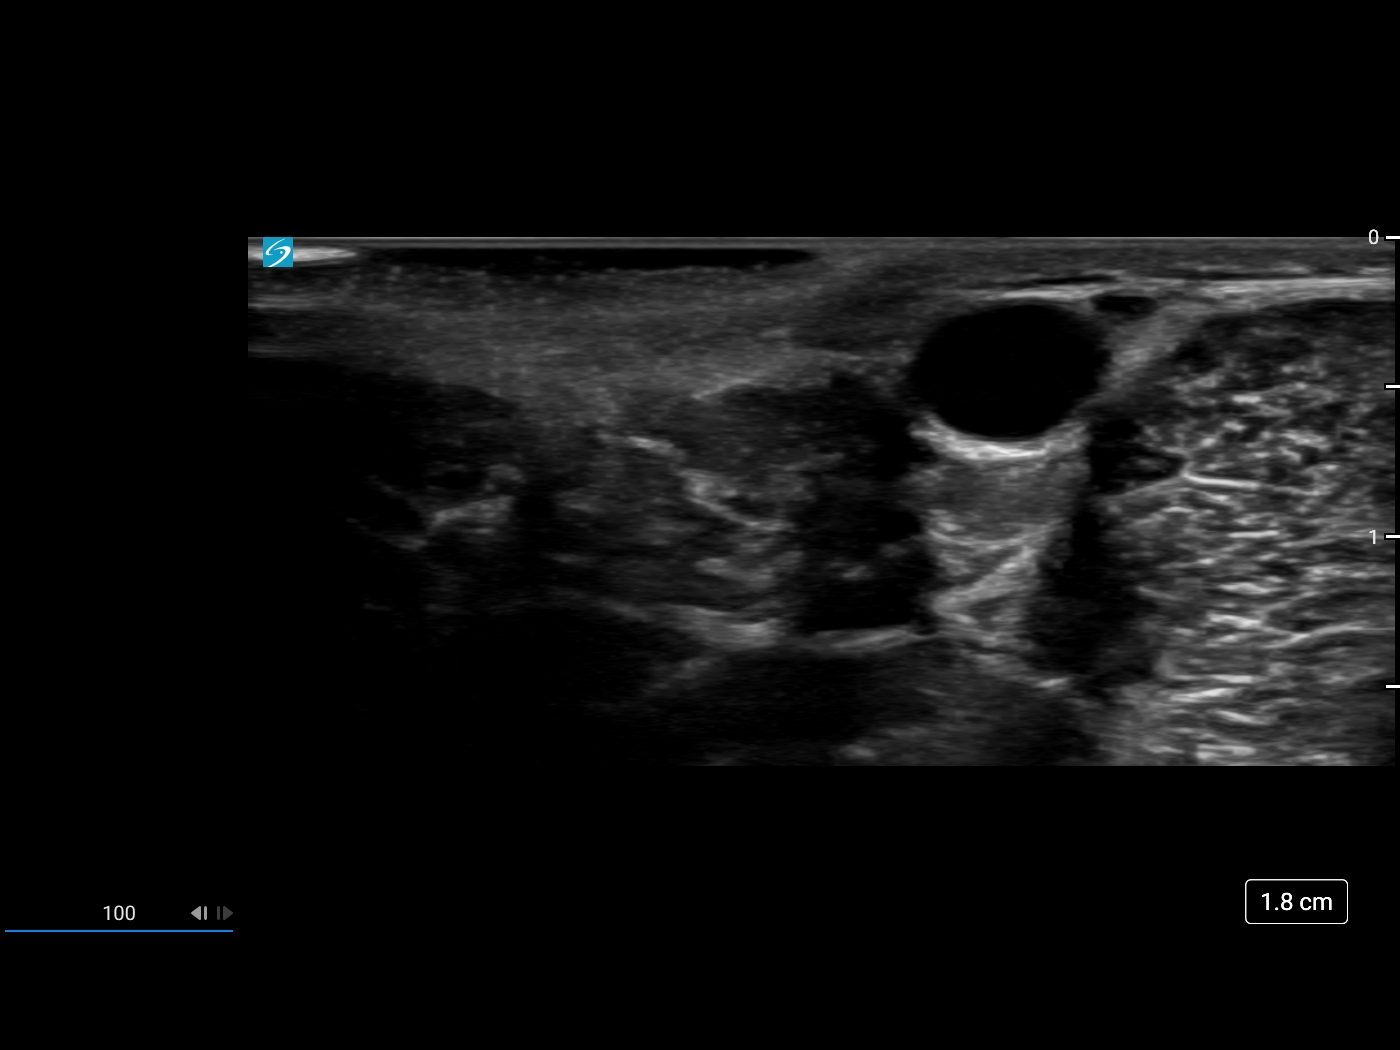

[Series 300: ir imaging guided port insertion · 1 of 1 slices shown]
[im 1/1]
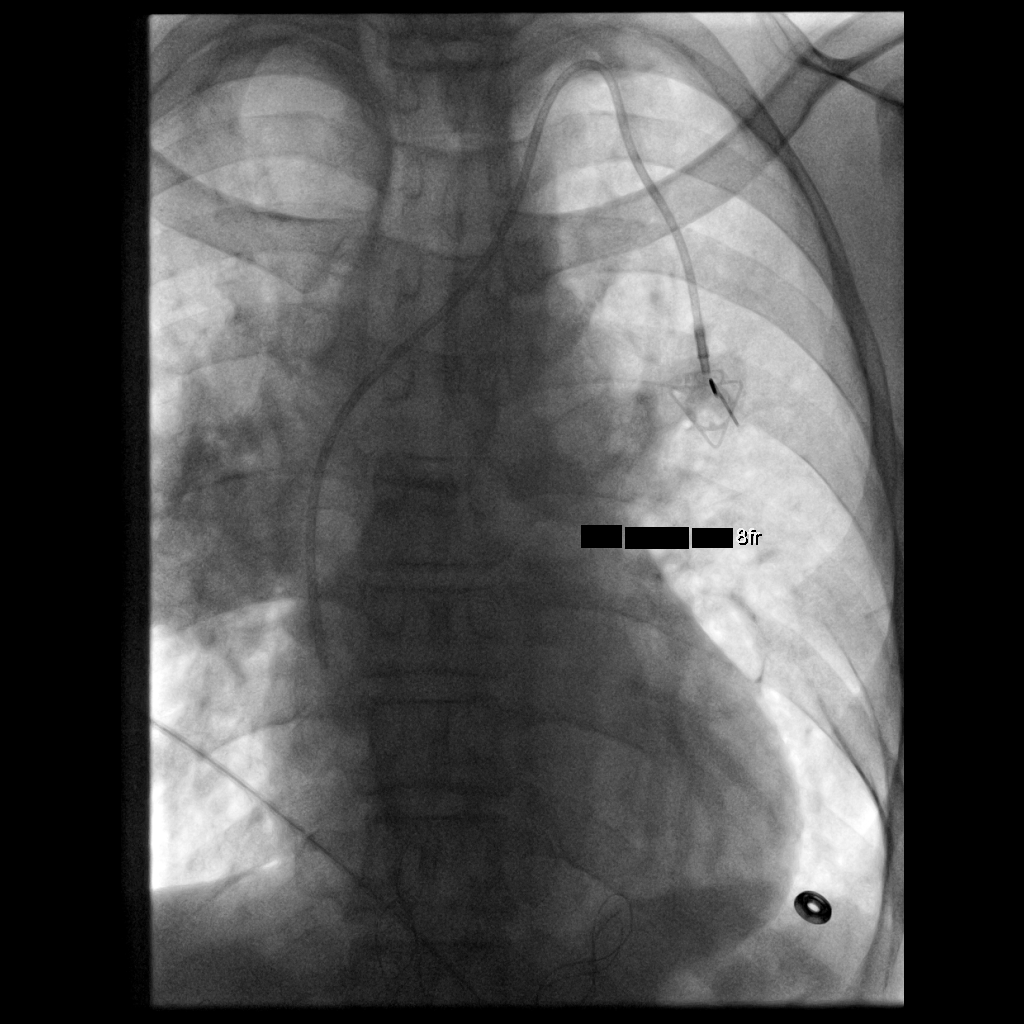

[2 of 2 positions shown; findings below may reference images not displayed]

EXAM:
FLUOROSCOPIC AND ULTRASOUND GUIDED PLACEMENT OF A SUBCUTANEOUS PORT.

MEDICATIONS:
Ancef 2 g; As antibiotic prophylaxis, Ancef 1 gm was ordered
pre-procedure and administered intravenously within one hour of
incision.

ANESTHESIA/SEDATION:
Versed 3.0 mg IV; Fentanyl 100 mcg IV;

Moderate Sedation Time:  28 minutes

The patient was continuously monitored during the procedure by the
interventional radiology nurse under my direct supervision.

FLUOROSCOPY TIME:  24 seconds, 2 mGy

COMPLICATIONS:
None immediate.

PROCEDURE:
The risks of the procedure were explained to the patient. Informed
consent was obtained. Patient was placed supine on the
interventional table. Ultrasound confirmed a patent left internal
jugular vein. Ultrasound image was saved for documentation. The left
chest and neck were cleaned with a skin antiseptic and a sterile
drape was placed. Maximal barrier sterile technique was utilized
including caps, mask, sterile gowns, sterile gloves, sterile drape,
hand hygiene and skin antiseptic. The left neck was anesthetized
with 1% lidocaine. Small incision was made in the left neck with a
blade. Micropuncture set was placed in the left IJ with ultrasound
guidance. The micropuncture wire was used for measurement purposes.
The left chest was anesthetized with 1% lidocaine with epinephrine.
#15 blade was used to make an incision and a subcutaneous port
pocket was formed. 8 french Power Port was assembled. Subcutaneous
tunnel was formed with a stiff tunneling device. The port catheter
was brought through the subcutaneous tunnel. The port was placed in
the subcutaneous pocket. The micropuncture set was exchanged for a
peel-away sheath. The catheter was placed through the peel-away
sheath and the tip was positioned at the SVC and right atrium
junction. Catheter placement was confirmed with fluoroscopy. The
port was accessed and flushed with heparinized saline. The port
pocket was closed using two layers of absorbable sutures and
Dermabond. The vein skin site was closed using a single layer of
absorbable suture and Dermabond. Sterile dressings were applied.
Patient tolerated the procedure well without an immediate
complication. Ultrasound and fluoroscopic images were taken and
saved for this procedure.
IMPRESSION: Placement of a subcutaneous port device. Catheter tip at the SVC and
right atrium junction.

## 2019-04-26 MED ORDER — LIDOCAINE HCL 1 % IJ SOLN
INTRAMUSCULAR | Status: AC
  Filled 2019-04-26: qty 20

## 2019-04-26 MED ORDER — LIDOCAINE-EPINEPHRINE 1 %-1:100000 IJ SOLN
INTRAMUSCULAR | Status: AC
  Filled 2019-04-26: qty 1

## 2019-04-26 MED ORDER — MIDAZOLAM HCL 2 MG/2ML IJ SOLN
INTRAMUSCULAR | Status: AC
  Filled 2019-04-26: qty 4

## 2019-04-26 MED ORDER — CEFAZOLIN SODIUM-DEXTROSE 2-4 GM/100ML-% IV SOLN: 2.0000 g | Freq: Once | INTRAVENOUS | Status: DC

## 2019-04-26 MED ORDER — SODIUM CHLORIDE 0.9 % IV SOLN: INTRAVENOUS | Status: DC

## 2019-04-26 MED ORDER — CEFAZOLIN SODIUM-DEXTROSE 2-4 GM/100ML-% IV SOLN: 2.0000 g | Freq: Once | INTRAVENOUS | Status: AC

## 2019-04-26 MED ORDER — MIDAZOLAM HCL 2 MG/2ML IJ SOLN
INTRAMUSCULAR | Status: AC | PRN
  Administered 2019-04-26 (×3): 1 mg via INTRAVENOUS

## 2019-04-26 MED ORDER — FENTANYL CITRATE (PF) 100 MCG/2ML IJ SOLN
INTRAMUSCULAR | Status: AC | PRN
  Administered 2019-04-26 (×2): 50 ug via INTRAVENOUS

## 2019-04-26 MED ORDER — HEPARIN SOD (PORK) LOCK FLUSH 100 UNIT/ML IV SOLN
INTRAVENOUS | Status: AC | PRN
  Administered 2019-04-26: 500 [IU] via INTRAVENOUS

## 2019-04-26 MED ORDER — FENTANYL CITRATE (PF) 100 MCG/2ML IJ SOLN
INTRAMUSCULAR | Status: AC
  Filled 2019-04-26: qty 2

## 2019-04-26 MED ORDER — CEFAZOLIN SODIUM-DEXTROSE 2-4 GM/100ML-% IV SOLN
INTRAVENOUS | Status: AC
  Administered 2019-04-26: 12:00:00 2 g via INTRAVENOUS
  Filled 2019-04-26: qty 100

## 2019-04-26 MED ORDER — LIDOCAINE-EPINEPHRINE (PF) 1 %-1:200000 IJ SOLN
INTRAMUSCULAR | Status: AC | PRN
  Administered 2019-04-26: 10 mL

## 2019-04-26 MED ORDER — LIDOCAINE HCL (PF) 1 % IJ SOLN
INTRAMUSCULAR | Status: AC | PRN
  Administered 2019-04-26: 5 mL

## 2019-04-26 MED ORDER — HEPARIN SOD (PORK) LOCK FLUSH 100 UNIT/ML IV SOLN
INTRAVENOUS | Status: AC
  Filled 2019-04-26: qty 5

## 2019-04-26 NOTE — H&P (Signed)
Chief Complaint: Patient was seen in consultation today for lung cancer/Port-a-cath placement.  Referring Physician(s): Brunetta Genera  Supervising Physician: Markus Daft  Patient Status: [facility] - Out-pt  History of Present Illness: David Ruesch. is a 53 y.o. male with a past medical history of chronic bronchitis with emphysema, lung cancer, and GERD. He was unfortunately diagnosed with metastatic poorly differentiated lung adenocarcinoma in 01/2019. His cancer is managed by Dr. Irene Limbo. He has tentative plans to begin systemic chemotherapy for management.  IR requested by Dr. Irene Limbo for possible image-guided Port-a-cath placement. Patient awake and alert sitting in bed with no complaints at this time. Denies fever, chills, chest pain, dyspnea, abdominal pain, or headache.  Currently taking Eliquis 5 mg twice daily- last dose Monday 04/23/2019.   Past Medical History:  Diagnosis Date  . Blood in stool   . Cancer ([place])   . Chronic bronchitis with emphysema ([place])   . GERD (gastroesophageal reflux disease)     Past Surgical History:  Procedure Laterality Date  . APPENDECTOMY     teenager  . INGUINAL HERNIA REPAIR Right 2016, 2017   [facility], 2018 [facility]-removed mesh    Allergies: Pregabalin  Medications: Prior to Admission medications   Medication Sig Start Date End Date Taking? Authorizing Provider  apixaban (ELIQUIS) 5 MG TABS tablet Take 1 tablet (5 mg total) by mouth 2 (two) times daily. 02/19/19   Brunetta Genera, MD  esomeprazole (NEXIUM) 40 MG capsule Take 1 capsule (40 mg total) by mouth daily before breakfast. Patient not taking: Reported on 04/11/2019 03/28/19   Brunetta Genera, MD  fentaNYL (DURAGESIC) 12 MCG/HR Place 1 patch onto the skin every 3 (three) days. Patient not taking: Reported on 02/22/2019 02/19/19   Brunetta Genera, MD  LORazepam (ATIVAN) 0.5 MG tablet Take 0.5 mg by mouth every 4 (four) hours as needed for  anxiety.    [provider]  magic mouthwash w/lidocaine SOLN Take 5 mLs by mouth 4 (four) times daily as needed for mouth pain. Swish in the mouth and swallow for radiation related throat pain Patient not taking: Reported on 04/11/2019 02/19/19   Brunetta Genera, MD  omeprazole (PRILOSEC) 20 MG capsule Take 20 mg by mouth daily.    [provider]  ondansetron (ZOFRAN) 8 MG tablet Take 1 tablet (8 mg total) by mouth 2 (two) times daily as needed for refractory nausea / vomiting. Start on day 3 after carboplatin chemo. Patient not taking: Reported on 04/11/2019 03/21/19   Brunetta Genera, MD  oxyCODONE (OXY IR/ROXICODONE) 5 MG immediate release tablet Take 1-2 tablets (5-10 mg total) by mouth every 4 (four) hours as needed for severe pain. 03/06/19   Brunetta Genera, MD  prochlorperazine (COMPAZINE) 10 MG tablet Take 1 tablet (10 mg total) by mouth every 6 (six) hours as needed (Nausea or vomiting). Patient not taking: Reported on 04/11/2019 03/21/19   Brunetta Genera, MD     Family History  Problem Relation Age of Onset  . Prostate cancer Father     Social History   Socioeconomic History  . Marital status: Single    Spouse name: Not on file  . Number of children: 1  . Years of education: GED  . Highest education level: Not on file  Occupational History    Comment: fork lift driver  Tobacco Use  . Smoking status: Former Smoker    Types: Cigars  . Smokeless tobacco: Never Used  .  Tobacco comment: smokes 3 black and mild cigars per day  Substance and Sexual Activity  . Alcohol use: Not Currently  . Drug use: No  . Sexual activity: Yes  Other Topics Concern  . Not on file  Social History Narrative   Lives alone   Caffeine- coffee, 20 oz daily, tea occas   Social Determinants of Health   Financial Resource Strain:   . Difficulty of Paying Living Expenses: Not on file  Food Insecurity:   . Worried About Charity fundraiser in the Last Year:  Not on file  . Ran Out of Food in the Last Year: Not on file  Transportation Needs:   . Lack of Transportation (Medical): Not on file  . Lack of Transportation (Non-Medical): Not on file  Physical Activity:   . Days of Exercise per Week: Not on file  . Minutes of Exercise per Session: Not on file  Stress:   . Feeling of Stress : Not on file  Social Connections:   . Frequency of Communication with Friends and Family: Not on file  . Frequency of Social Gatherings with Friends and Family: Not on file  . Attends Religious Services: Not on file  . Active Member of Clubs or Organizations: Not on file  . Attends Archivist Meetings: Not on file  . Marital Status: Not on file     Review of Systems: A 12 point ROS discussed and pertinent positives are indicated in the HPI above.  All other systems are negative.  Review of Systems  Constitutional: Negative for chills and fever.  Respiratory: Negative for shortness of breath and wheezing.   Cardiovascular: Negative for chest pain and palpitations.  Gastrointestinal: Negative for abdominal pain.  Neurological: Negative for headaches.  Psychiatric/Behavioral: Negative for behavioral problems and confusion.    Vital Signs: BP (!) 80/60   Pulse 98   Temp 98.2 F (36.8 C) (Oral)   Resp 18   SpO2 98%   Physical Exam Vitals and nursing note reviewed.  Constitutional:      General: He is not in acute distress.    Appearance: Normal appearance.  Cardiovascular:     Rate and Rhythm: Normal rate and regular rhythm.     Heart sounds: Normal heart sounds. No murmur.  Pulmonary:     Effort: Pulmonary effort is normal. No respiratory distress.     Breath sounds: Normal breath sounds. No wheezing.  Skin:    General: Skin is warm and dry.  Neurological:     Mental Status: He is alert and oriented to person, place, and time.  Psychiatric:        Mood and Affect: Mood normal.        Behavior: Behavior normal.      MD  Evaluation Airway: WNL Heart: WNL Abdomen: WNL Chest/ Lungs: WNL ASA  Classification: 3 Mallampati/Airway Score: One   Imaging: No results found.  Labs:  CBC: Recent Labs    03/21/19 0828 03/28/19 0907 04/10/19 1041 04/26/19 1027  WBC 5.0 4.5 5.1 7.5  HGB 12.7* 12.1* 11.9* 12.3*  HCT 38.5* 37.2* 36.9* 38.3*  PLT 219 389 352 298    COAGS: Recent Labs    01/20/19 1357 04/26/19 1027  INR 1.2 0.9  APTT 30  --     BMP: Recent Labs    02/26/19 0925 03/21/19 0828 03/28/19 0907 04/10/19 1041  NA 138 137 138 138  K 3.7 4.3 4.1 4.2  CL 102 103 103 105  CO2 25 24 23 24   GLUCOSE 107* 85 95 97  BUN 9 6 8 7   CALCIUM 9.5 9.5 9.6 9.2  CREATININE 0.81 0.83 0.82 0.76  GFRNONAA >60 >60 >60 >60  GFRAA >60 >60 >60 >60    LIVER FUNCTION TESTS: Recent Labs    02/26/19 0925 03/21/19 0828 03/28/19 0907 04/10/19 1041  BILITOT 0.2* 0.3 <0.2* <0.2*  AST 13* 10* 12* 13*  ALT 24 13 15 12   ALKPHOS 108 116 118 103  PROT 6.9 7.2 7.4 7.0  ALBUMIN 3.4* 3.4* 3.1* 3.1*     Assessment and Plan:  Lung cancer with tentative plans to begin systemic chemotherapy. Plan for image-guided Port-a-cath placement today in IR. Patient is NPO. Afebrile and WBCs WNL. Last dose Eliquis 04/23/2019- ok to proceed per IR protocol. INR 0.9 today.  Risks and benefits of image guided port-a-catheter placement were discussed with the patient including, but not limited to bleeding, infection, pneumothorax, or fibrin sheath development and need for additional procedures. All of the patient's questions were answered, patient is agreeable to proceed. Consent signed and in chart.   Thank you for this interesting consult.  I greatly enjoyed meeting David Alms. and look forward to participating in their care.  A copy of this report was sent to the requesting provider on this date.  Electronically Signed: Earley Abide, PA-C 04/26/2019, 10:49 AM   I spent a total of 40 Minutes in  face to face in clinical consultation, greater than 50% of which was counseling/coordinating care for lung cancer/Port-a-cath placement.

## 2019-04-26 NOTE — Procedures (Signed)
Interventional Radiology Procedure:   Indications: Lung cancer  Procedure: Port placement  Findings: Left jugular port, tip at SVC/RA junction  Complications: None     EBL: Minimal, less than 10 ml  Plan: Discharge in one hour.  Keep port site and incisions dry for at least 24 hours.     Zunaira Lamy R. Anselm Pancoast, MD  Pager: [phone]  Interventional Radiology Procedure:   Indications:   Procedure: Port placement  Findings: Right jugular port, tip at SVC/RA junction  Complications: None     EBL: Minimal, less than 10 ml  Plan: Discharge in one hour.  Keep port site and incisions dry for at least 24 hours.     Kamoria Lucien R. Anselm Pancoast, MD  Pager: [phone]

## 2019-04-26 NOTE — Progress Notes (Signed)
Spoke with Jeanne Ivan, patients neighbor, he confirmed he is patients ride home.  Updated him on possible d/c time. Will call him when patient is back from procedure.

## 2019-04-26 NOTE — Discharge Instructions (Signed)
Implanted Port Insertion, Care After This sheet gives you information about how to care for yourself after your procedure. Your health care provider may also give you more specific instructions. If you have problems or questions, contact your health care provider. What can I expect after the procedure? After the procedure, it is common to have: Discomfort at the port insertion site. Bruising on the skin over the port. This should improve over 3-4 days. Follow these instructions at home: [facility] care After your port is placed, you will get a manufacturer's information card. The card has information about your port. Keep this card with you at all times. Take care of the port as told by your health care provider. Ask your health care provider if you or a family member can get training for taking care of the port at home. A home health care nurse may also take care of the port. Make sure to remember what type of port you have. Incision care Follow instructions from your health care provider about how to take care of your port insertion site. Make sure you: Wash your hands with soap and water before and after you change your bandage (dressing). If soap and water are not available, use hand sanitizer. Change your dressing as told by your health care provider. Leave stitches (sutures), skin glue, or adhesive strips in place. These skin closures may need to stay in place for 2 weeks or longer. If adhesive strip edges start to loosen and curl up, you may trim the loose edges. Do not remove adhesive strips completely unless your health care provider tells you to do that. Check your port insertion site every day for signs of infection. Check for: Redness, swelling, or pain. Fluid or blood. Warmth. Pus or a bad smell. Activity Return to your normal activities as told by your health care provider. Ask your health care provider what activities are safe for you. Do not lift anything that is heavier than 10 lb  (4.5 kg), or the limit that you are told, until your health care provider says that it is safe. General instructions Take over-the-counter and prescription medicines only as told by your health care provider. Do not take baths, swim, or use a hot tub until your health care provider approves. Ask your health care provider if you may take showers. You may only be allowed to take sponge baths. Do not drive for 24 hours if you were given a sedative during your procedure. Wear a medical alert bracelet in case of an emergency. This will tell any health care providers that you have a port. Keep all follow-up visits as told by your health care provider. This is important. Contact a health care provider if: You cannot flush your port with saline as directed, or you cannot draw blood from the port. You have a fever or chills. You have redness, swelling, or pain around your port insertion site. You have fluid or blood coming from your port insertion site. Your port insertion site feels warm to the touch. You have pus or a bad smell coming from the port insertion site. Get help right away if: You have chest pain or shortness of breath. You have bleeding from your port that you cannot control. Summary Take care of the port as told by your health care provider. Keep the manufacturer's information card with you at all times. Change your dressing as told by your health care provider. Contact a health care provider if you have a fever or chills  or if you have redness, swelling, or pain around your port insertion site. Keep all follow-up visits as told by your health care provider. This information is not intended to replace advice given to you by your health care provider. Make sure you discuss any questions you have with your health care provider. Document Released: 02/14/2013 Document Revised: 11/22/2017 Document Reviewed: 11/22/2017 Elsevier Patient Education  [place]  not: ? Participate in activities where you could fall or become injured. ? Drive. ? Use heavy machinery. ? Drink alcohol. ? Take sleeping pills or medicines that cause drowsiness. ? Make important decisions or sign legal documents. ? Take care of children on your own.  Rest. Eating and drinking  Follow the diet recommended by your health care provider.  If you vomit: ? Drink water, juice, or soup when you can drink without vomiting. ? Make sure you have little or no nausea before eating solid foods. General instructions  Have a responsible adult stay with you until you are awake and alert.  Take over-the-counter and prescription medicines only as told by your health care provider.  If you smoke, do not smoke without supervision.  Keep all follow-up visits as told by your health care provider. This is important. Contact a health care provider if:  You keep feeling nauseous or you keep vomiting.  You feel light-headed.  You develop a rash.  You have a fever. Get help right away if:  You have trouble breathing.  Moderate Conscious Sedation, Adult, Care After These instructions provide you with information about caring for yourself after your procedure. Your health care provider may also give you more specific instructions. Your treatment has been planned according to current medical practices, but problems sometimes occur. Call your health care provider if you have any problems or questions after your procedure. What can I expect after the procedure? After your procedure, it is common:  To feel sleepy for several hours.  To feel clumsy and have poor balance for several hours.  To have poor judgment for several hours.  To vomit if you eat too soon. Follow these instructions at home: For at least 24 hours after the procedure:   Do not: ? Participate in activities where you could fall or become injured. ? Drive. ? Use heavy machinery. ? Drink alcohol. ? Take  sleeping pills or medicines that cause drowsiness. ? Make important decisions or sign legal documents. ? Take care of children on your own.  Rest. Eating and drinking  Follow the diet recommended by your health care provider.  If you vomit: ? Drink water, juice, or soup when you can drink without vomiting. ? Make sure you have little or no nausea before eating solid foods. General instructions  Have a responsible adult stay with you until you are awake and alert.  Take over-the-counter and prescription medicines only as told by your health care provider.  If you smoke, do not smoke without supervision.  Keep all follow-up visits as told by your health care provider. This is important. Contact a health care provider if:  You keep feeling nauseous or you keep vomiting.  You feel light-headed.  You develop a rash.  You have a fever. Get help right away if:  You have trouble breathing.

## 2019-04-27 ENCOUNTER — Encounter: Payer: Self-pay | Admitting: Pharmacy Technician

## 2019-04-27 NOTE — Progress Notes (Signed)
The patient is approved for drug assistance by [employer] for [employer] and [employer] for [employer]. Enrollment is effective until 04/11/20 and is based on Self-Pay. Replacement drug begins on DOS 04/12/11.

## 2019-04-29 NOTE — Progress Notes (Signed)
  Radiation Oncology         [phone]) [phone] ________________________________  Name: David Berry. 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 2)  The tumor was continued with 21 more fractions of 2 Gy to a total of 50 Gy  Beams/energy:    1)  Anterior and posterior fields were initially used on an urgent bases for SVC. 2)  A five field 3D conformal treatment arrangement was used delivering 6 and 10 MV photons.  Daily image-guidance CT was used to align the treatment with the targeted volume  Narrative: The patient tolerated radiation treatment relatively well.  The patient experienced some esophagitis characterized as moderate.  The patient also noted fatigue.  Plan: The patient has completed radiation treatment. The patient will return to radiation oncology clinic for routine followup in one month. I advised him to call or return sooner if he has any questions or concerns related to his recovery or treatment.  ________________________________  Sheral Apley. Tammi Klippel, M.D.

## 2019-05-02 ENCOUNTER — Inpatient Hospital Stay: Payer: Medicaid Other

## 2019-05-02 ENCOUNTER — Other Ambulatory Visit: Payer: Self-pay

## 2019-05-02 ENCOUNTER — Encounter: Payer: Self-pay | Admitting: Pharmacy Technician

## 2019-05-02 ENCOUNTER — Inpatient Hospital Stay (HOSPITAL_BASED_OUTPATIENT_CLINIC_OR_DEPARTMENT_OTHER): Payer: Medicaid Other | Admitting: Hematology

## 2019-05-02 ENCOUNTER — Encounter: Payer: Self-pay | Admitting: Hematology

## 2019-05-02 VITALS — HR 96

## 2019-05-02 VITALS — BP 100/76 | HR 112 | Temp 97.8°F | Resp 18 | Ht 71.0 in | Wt 131.6 lb

## 2019-05-02 DIAGNOSIS — Z5111 Encounter for antineoplastic chemotherapy: Secondary | ICD-10-CM

## 2019-05-02 DIAGNOSIS — G893 Neoplasm related pain (acute) (chronic): Secondary | ICD-10-CM

## 2019-05-02 DIAGNOSIS — C3491 Malignant neoplasm of unspecified part of right bronchus or lung: Secondary | ICD-10-CM

## 2019-05-02 DIAGNOSIS — Z5112 Encounter for antineoplastic immunotherapy: Secondary | ICD-10-CM | POA: Diagnosis not present

## 2019-05-02 DIAGNOSIS — Z7189 Other specified counseling: Secondary | ICD-10-CM

## 2019-05-02 DIAGNOSIS — I82621 Acute embolism and thrombosis of deep veins of right upper extremity: Secondary | ICD-10-CM

## 2019-05-02 LAB — CMP (CANCER CENTER ONLY)
ALT: 21 U/L (ref 0–44)
AST: 19 U/L (ref 15–41)
Albumin: 3.6 g/dL (ref 3.5–5.0)
Alkaline Phosphatase: 137 U/L — ABNORMAL HIGH (ref 38–126)
Anion gap: 8 (ref 5–15)
BUN: 10 mg/dL (ref 6–20)
CO2: 28 mmol/L (ref 22–32)
Calcium: 9.7 mg/dL (ref 8.9–10.3)
Chloride: 103 mmol/L (ref 98–111)
Creatinine: 0.72 mg/dL (ref 0.61–1.24)
GFR, Est AFR Am: >60 mL/min (ref >60)
GFR, Estimated: >60 mL/min (ref >60)
Glucose, Bld: 85 mg/dL (ref 70–99)
Potassium: 4.2 mmol/L (ref 3.5–5.1)
Sodium: 139 mmol/L (ref 135–145)
Total Bilirubin: 0.2 mg/dL — ABNORMAL LOW (ref 0.3–1.2)
Total Protein: 7.3 g/dL (ref 6.5–8.1)

## 2019-05-02 LAB — CBC WITH DIFFERENTIAL/PLATELET
Abs Immature Granulocytes: 0.03 10*3/uL (ref 0.00–0.07)
Basophils Absolute: 0 10*3/uL (ref 0.0–0.1)
Basophils Relative: 0 %
Eosinophils Absolute: 0 10*3/uL (ref 0.0–0.5)
Eosinophils Relative: 0 %
HCT: 36.4 % — ABNORMAL LOW (ref 39.0–52.0)
Hemoglobin: 11.9 g/dL — ABNORMAL LOW (ref 13.0–17.0)
Immature Granulocytes: 0 %
Lymphocytes Relative: 11 %
Lymphs Abs: 0.8 10*3/uL (ref 0.7–4.0)
MCH: 28.5 pg (ref 26.0–34.0)
MCHC: 32.7 g/dL (ref 30.0–36.0)
MCV: 87.3 fL (ref 80.0–100.0)
Monocytes Absolute: 1.6 10*3/uL — ABNORMAL HIGH (ref 0.1–1.0)
Monocytes Relative: 23 %
Neutro Abs: 4.5 10*3/uL (ref 1.7–7.7)
Neutrophils Relative %: 66 %
Platelets: 351 10*3/uL (ref 150–400)
RBC: 4.17 MIL/uL — ABNORMAL LOW (ref 4.22–5.81)
RDW: 17.3 % — ABNORMAL HIGH (ref 11.5–15.5)
WBC: 6.9 10*3/uL (ref 4.0–10.5)
nRBC: 0 % (ref 0.0–0.2)

## 2019-05-02 LAB — MAGNESIUM: Magnesium: 2 mg/dL (ref 1.7–2.4)

## 2019-05-02 MED ORDER — SODIUM CHLORIDE 0.9 % IV SOLN
550.0000 mg | Freq: Once | INTRAVENOUS | Status: AC
  Administered 2019-05-02: 17:00:00 550 mg via INTRAVENOUS
  Filled 2019-05-02: qty 55

## 2019-05-02 MED ORDER — PALONOSETRON HCL INJECTION 0.25 MG/5ML
0.2500 mg | Freq: Once | INTRAVENOUS | Status: AC
  Administered 2019-05-02: 11:00:00 0.25 mg via INTRAVENOUS

## 2019-05-02 MED ORDER — SODIUM CHLORIDE 0.9% FLUSH
10.0000 mL | INTRAVENOUS | Status: DC | PRN
  Administered 2019-05-02: 18:00:00 10 mL
  Filled 2019-05-02: qty 10

## 2019-05-02 MED ORDER — FAMOTIDINE IN NACL 20-0.9 MG/50ML-% IV SOLN
20.0000 mg | Freq: Once | INTRAVENOUS | Status: AC
  Administered 2019-05-02: 12:00:00 20 mg via INTRAVENOUS

## 2019-05-02 MED ORDER — SODIUM CHLORIDE 0.9 % IV SOLN
1200.0000 mg | Freq: Once | INTRAVENOUS | Status: AC
  Administered 2019-05-02: 13:00:00 1200 mg via INTRAVENOUS
  Filled 2019-05-02: qty 20

## 2019-05-02 MED ORDER — DIPHENHYDRAMINE HCL 50 MG/ML IJ SOLN
INTRAMUSCULAR | Status: AC
  Filled 2019-05-02: qty 1

## 2019-05-02 MED ORDER — FAMOTIDINE IN NACL 20-0.9 MG/50ML-% IV SOLN
INTRAVENOUS | Status: AC
  Filled 2019-05-02: qty 50

## 2019-05-02 MED ORDER — HEPARIN SOD (PORK) LOCK FLUSH 100 UNIT/ML IV SOLN
500.0000 [IU] | Freq: Once | INTRAVENOUS | Status: AC | PRN
  Administered 2019-05-02: 18:00:00 500 [IU]
  Filled 2019-05-02: qty 5

## 2019-05-02 MED ORDER — PALONOSETRON HCL INJECTION 0.25 MG/5ML
INTRAVENOUS | Status: AC
  Filled 2019-05-02: qty 5

## 2019-05-02 MED ORDER — LORAZEPAM 0.5 MG PO TABS: 0.5000 mg | ORAL_TABLET | ORAL | 0 refills | Status: DC | PRN

## 2019-05-02 MED ORDER — DIPHENHYDRAMINE HCL 50 MG/ML IJ SOLN
50.0000 mg | Freq: Once | INTRAMUSCULAR | Status: AC
  Administered 2019-05-02: 50 mg via INTRAVENOUS

## 2019-05-02 MED ORDER — SODIUM CHLORIDE 0.9 % IV SOLN
135.0000 mg/m2 | Freq: Once | INTRAVENOUS | Status: AC
  Administered 2019-05-02: 14:00:00 228 mg via INTRAVENOUS
  Filled 2019-05-02: qty 38

## 2019-05-02 MED ORDER — OXYCODONE HCL 5 MG PO TABS: 5.0000 mg | ORAL_TABLET | ORAL | 0 refills | Status: DC | PRN

## 2019-05-02 MED ORDER — SODIUM CHLORIDE 0.9 % IV SOLN
Freq: Once | INTRAVENOUS | Status: AC
  Filled 2019-05-02: qty 5

## 2019-05-02 MED ORDER — SODIUM CHLORIDE 0.9 % IV SOLN
Freq: Once | INTRAVENOUS | Status: AC
  Filled 2019-05-02: qty 250

## 2019-05-02 MED ORDER — LIDOCAINE-PRILOCAINE 2.5-2.5 % EX KIT: PACK | Freq: Once | CUTANEOUS | 2 refills | Status: AC

## 2019-05-02 NOTE — Progress Notes (Signed)
The patient is approved for drug assistance by [place] for [employer]  Enrollment is effective until 04/10/21 and is based on self-pay.  Drug replacement begins on DOS 04/10/19.

## 2019-05-02 NOTE — Patient Instructions (Signed)
[facility] Discharge Instructions for Patients Receiving Chemotherapy  Today you received the following chemotherapy agents:  Atezolizumab, Paclitaxel, Carboplatin  To help prevent nausea and vomiting after your treatment, we encourage you to take your nausea medication as prescribed.   If you develop nausea and vomiting that is not controlled by your nausea medication, call the clinic.   BELOW ARE SYMPTOMS THAT SHOULD BE REPORTED IMMEDIATELY:  *FEVER GREATER THAN 100.5 F  *CHILLS WITH OR WITHOUT FEVER  NAUSEA AND VOMITING THAT IS NOT CONTROLLED WITH YOUR NAUSEA MEDICATION  *UNUSUAL SHORTNESS OF BREATH  *UNUSUAL BRUISING OR BLEEDING  TENDERNESS IN MOUTH AND THROAT WITH OR WITHOUT PRESENCE OF ULCERS  *URINARY PROBLEMS  *BOWEL PROBLEMS  UNUSUAL RASH Items with * indicate a potential emergency and should be followed up as soon as possible.  Feel free to call the clinic should you have any questions or concerns. The clinic phone number is [phone].  Please show the [place] at check-in to the Emergency Department and triage nurse.

## 2019-05-02 NOTE — Progress Notes (Signed)
HEMATOLOGY/ONCOLOGY CLINIC NOTE  Date of Service: 05/02/2019  Patient Care Team: [name], MD as PCP - General (Family Medicine)  CHIEF COMPLAINTS/PURPOSE OF CONSULTATION:  contnued management of Metastatic Poorly differentiated lung adenocarcinoma  HISTORY OF PRESENTING ILLNESS:   [name] is a 53 year old male with no significant medical history.  He presented to the hospital with progressive right arm and chest pain with new right-sided neck swelling.  The patient states that he was diagnosed with carpal tunnel syndrome received an injection in August.  However, he continued to have progressive pain up and down his right arm that would also radiate to his right chest.  He took ibuprofen with no improvement.  1 week prior to admission, he developed new right sided neck swelling.  He presented to the emergency room for further evaluation.  Work-up in the emergency room was significant for a CT angiogram of the neck and chest which revealed a bulky soft tissue mass in the right neck continuing into the upper chest most compatible with extensive metastatic lymphadenopathy.  This mass measures up to 34 mm and encases multiple vessels including the right upper lobe pulmonary vessels, superior vena cava, and brachiocephalic vessel.  He also had a large poorly defined malignant appearing infiltrative mass measuring 6.2 x 5.6 x 7.9 cm within the right anterior and middle mediastinum with suspected invasion of the distal left brachiocephalic vein and probable tumor occlusion of the right subclavian and jugular veins.  There is also suspected tumor invasion of the upper SVC with string-like narrowing of the SVC but with patency of the SVC just above the right atrium.  The tumor abuts the aorta and great vessels and also results in significant narrowing of the right upper lobe pulmonary arterial vessels.  He also had a CT of the abdomen pelvis which showed a 15 mm heterogeneously enhancing lesion  in the upper pole of the right kidney concerning for renal cell carcinoma, no lymphadenopathy in the abdomen or pelvis, focal hyperenhancement of the anterior left liver most likely related to aberrant venous anatomy/flow, sclerotic lesions in the right acetabulum and left iliac bone are indeterminate.  These are likely bone islands but attention on follow-up is recommended a metastatic disease cannot be excluded.  MRI of the brain with and without contrast did not show any evidence of intracranial metastatic disease. The patient underwent a CT-guided biopsy in interventional radiology on 01/22/2019.  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   When seen today, patient reports ongoing pain and swelling to his right arm and right side of his neck.  Reports ongoing pain to his right chest.  He reports intermittent facial swelling which is worse in the morning and improves throughout the day.  He reports anorexia and a weight loss of 20 pounds over the past few weeks. He has had dizziness for the past few months.  Denies headaches.  He also noted that on the day of admission his peripheral vision was poor.  His vision is now improved.  He denies food getting stuck or difficulty swallowing but does feel as though his throat is "tight."  He reports a nonproductive cough.  Denies fevers and chills.  He is not really having any shortness of breath.  Denies abdominal pain, nausea, vomiting, constipation, diarrhea.  He has not noticed any epistaxis, hemoptysis, hematemesis, hematuria, melena, hematochezia.  The patient is single.  He has 1 daughter who lives  in [place], [place].  Reports occasional alcohol use and currently smokes 3 cigars a day since 1997.  He is currently living in a rooming house with other roommates who all have their own room.  They share a common bathroom and kitchen.  Medical oncology was asked see the  patient to make recommendations regarding his newly diagnosed poorly differentiated carcinoma.  History reviewed. No pertinent past medical history.  Current Treatment:  Atezolizumab with plan to start carbo/taxol  INTERVAL HISTORY:  David Berry. is a  53 y.o. male who is here for evaluation and management of metastatic poorly differentiated lung adenocarcinoma. He is here for C2 of Atezolizumab, Carboplatin and Taxol. The patient's last visit with [place] was on 04/10/2019. The pt reports that he is doing well overall.  The pt reports headaches, soreness from nele, and has has shortness of breath. The cough is not a wet cough but he does not feel that it is a dry cough.   He has had his port inserted   He is using the oxycodone and lorazepam.  He was approved for medicaid and SSI  Lab results today (05/02/19) of CBC w/diff and CMP is as follows: all values are WNL except for RBC at 4.17, hemoglobin at 11.9,  Hematocrit at 36.4, RDW at 17.3, Monocytes Absolute at 1.6,  Alkaline Phosphatase at 137, Total bilirubin at 0.2 .  On review of systems, pt reports pain in the LUQ and denies mouth sores, other abdominal pain, pedal edema and any other symptoms.   MEDICAL HISTORY:  Past Medical History:  Diagnosis Date  . Blood in stool   . Cancer ([place])   . Chronic bronchitis with emphysema ([place])   . GERD (gastroesophageal reflux disease)     SURGICAL HISTORY: Past Surgical History:  Procedure Laterality Date  . APPENDECTOMY     teenager  . INGUINAL HERNIA REPAIR Right 2016, 2017   [facility], 2018 [facility] mesh  . IR IMAGING GUIDED PORT INSERTION  04/26/2019    SOCIAL HISTORY: Social History   Socioeconomic History  . Marital status: Single    Spouse name: Not on file  . Number of children: 1  . Years of education: GED  . Highest education level: Not on file  Occupational History    Comment: fork lift driver  Tobacco Use  . Smoking status: Former Smoker     Types: Cigars  . Smokeless tobacco: Never Used  . Tobacco comment: smokes 3 black and mild cigars per day  Substance and Sexual Activity  . Alcohol use: Not Currently  . Drug use: No  . Sexual activity: Yes  Other Topics Concern  . Not on file  Social History Narrative   Lives alone   Caffeine- coffee, 20 oz daily, tea occas   Social Determinants of Health   Financial Resource Strain:   . Difficulty of Paying Living Expenses: Not on file  Food Insecurity:   . Worried About Charity fundraiser in the Last Year: Not on file  . Ran Out of Food in the Last Year: Not on file  Transportation Needs:   . Lack of Transportation (Medical): Not on file  . Lack of Transportation (Non-Medical): Not on file  Physical Activity:   . Days of Exercise per Week: Not on file  . Minutes of Exercise per Session: Not on file  Stress:   . Feeling of Stress : Not on file  Social Connections:   . Frequency of Communication  with Friends and Family: Not on file  . Frequency of Social Gatherings with Friends and Family: Not on file  . Attends Religious Services: Not on file  . Active Member of Clubs or Organizations: Not on file  . Attends Archivist Meetings: Not on file  . Marital Status: Not on file  Intimate Partner Violence:   . Fear of Current or Ex-Partner: Not on file  . Emotionally Abused: Not on file  . Physically Abused: Not on file  . Sexually Abused: Not on file    FAMILY HISTORY: Family History  Problem Relation Age of Onset  . Prostate cancer Father     ALLERGIES:  is allergic to pregabalin.  MEDICATIONS:  Current Outpatient Medications  Medication Sig Dispense Refill  . apixaban (ELIQUIS) 5 MG TABS tablet Take 1 tablet (5 mg total) by mouth 2 (two) times daily. 60 tablet 5  . esomeprazole (NEXIUM) 40 MG capsule Take 1 capsule (40 mg total) by mouth daily before breakfast. (Patient not taking: Reported on 04/11/2019) 30 capsule 1  . fentaNYL (DURAGESIC) 12 MCG/HR  Place 1 patch onto the skin every 3 (three) days. (Patient not taking: Reported on 02/22/2019) 10 patch 0  . LORazepam (ATIVAN) 0.5 MG tablet Take 0.5 mg by mouth every 4 (four) hours as needed for anxiety.    . magic mouthwash w/lidocaine SOLN Take 5 mLs by mouth 4 (four) times daily as needed for mouth pain. Swish in the mouth and swallow for radiation related throat pain (Patient not taking: Reported on 04/11/2019) 480 mL 1  . omeprazole (PRILOSEC) 20 MG capsule Take 20 mg by mouth daily.    . ondansetron (ZOFRAN) 8 MG tablet Take 1 tablet (8 mg total) by mouth 2 (two) times daily as needed for refractory nausea / vomiting. Start on day 3 after carboplatin chemo. (Patient not taking: Reported on 04/11/2019) 30 tablet 1  . oxyCODONE (OXY IR/ROXICODONE) 5 MG immediate release tablet Take 1-2 tablets (5-10 mg total) by mouth every 4 (four) hours as needed for severe pain. 60 tablet 0  . prochlorperazine (COMPAZINE) 10 MG tablet Take 1 tablet (10 mg total) by mouth every 6 (six) hours as needed (Nausea or vomiting). (Patient not taking: Reported on 04/11/2019) 30 tablet 1   No current facility-administered medications for this visit.    REVIEW OF SYSTEMS:   A 10+ POINT REVIEW OF SYSTEMS WAS OBTAINED including neurology, dermatology, psychiatry, cardiac, respiratory, lymph, extremities, GI, GU, Musculoskeletal, constitutional, breasts, reproductive, HEENT.  All pertinent positives are noted in the HPI.  All others are negative.    PHYSICAL EXAMINATION: ECOG FS:1 - Symptomatic but completely ambulatory  Vitals:   05/02/19 0955  BP: 100/76  Pulse: (!) 112  Resp: 18  Temp: 97.8 F (36.6 C)  SpO2: 99%   Wt Readings from Last 3 Encounters:  05/02/19 131 lb 9.6 oz (59.7 kg)  04/10/19 130 lb 14.4 oz (59.4 kg)  03/28/19 126 lb 8 oz (57.4 kg)   Body mass index is 18.35 kg/m.    GENERAL:alert, in no acute distress and comfortable SKIN: no acute rashes, no significant lesions EYES: conjunctiva  are pink and non-injected, sclera anicteric OROPHARYNX: MMM, no exudates, no oropharyngeal erythema or ulceration NECK: supple, no JVD LYMPH:  no palpable lymphadenopathy in the cervical, axillary or inguinal regions LUNGS: clear to auscultation b/l with normal respiratory effort HEART: regular rate & rhythm ABDOMEN:  normoactive bowel sounds , non tender, not distended. Extremity: no pedal edema PSYCH:  alert & oriented x 3 with fluent speech NEURO: no focal motor/sensory deficits   LABORATORY DATA:  I have reviewed the data as listed  . CBC Latest Ref Rng & Units 05/02/2019 04/26/2019 04/10/2019  WBC 4.0 - 10.5 K/uL 6.9 7.5 5.1  Hemoglobin 13.0 - 17.0 g/dL 11.9(L) 12.3(L) 11.9(L)  Hematocrit 39.0 - 52.0 % 36.4(L) 38.3(L) 36.9(L)  Platelets 150 - 400 K/uL 351 298 352    . CMP Latest Ref Rng & Units 05/02/2019 04/10/2019 03/28/2019  Glucose 70 - 99 mg/dL 85 97 95  BUN 6 - 20 mg/dL '10 7 8  '$ Creatinine 0.61 - 1.24 mg/dL 0.72 0.76 0.82  Sodium 135 - 145 mmol/L 139 138 138  Potassium 3.5 - 5.1 mmol/L 4.2 4.2 4.1  Chloride 98 - 111 mmol/L 103 105 103  CO2 22 - 32 mmol/L '28 24 23  '$ Calcium 8.9 - 10.3 mg/dL 9.7 9.2 9.6  Total Protein 6.5 - 8.1 g/dL 7.3 7.0 7.4  Total Bilirubin 0.3 - 1.2 mg/dL 0.2(L) <0.2(L) <0.2(L)  Alkaline Phos 38 - 126 U/L 137(H) 103 118  AST 15 - 41 U/L 19 13(L) 12(L)  ALT 0 - 44 U/L '21 12 15    '$ 01/22/2019 [facility]: Tumor Mutational Burden     01/22/2019 PD-L1 Immunohistochemistry Analysis    01/22/2019 Soft Tissue Needle Core Biopsy Surgical Pathology     RADIOGRAPHIC STUDIES: I have personally reviewed the radiological images as listed and agreed with the findings in the report. IR IMAGING GUIDED PORT INSERTION  Result Date: 04/26/2019 INDICATION: 53 year old with lung cancer.  Port-A-Cath needed for chemotherapy. EXAM: FLUOROSCOPIC AND ULTRASOUND GUIDED PLACEMENT OF A SUBCUTANEOUS PORT. Physician: Stephan Minister. Anselm Pancoast, MD MEDICATIONS: Ancef 2 g; As  antibiotic prophylaxis, Ancef 1 gm was ordered pre-procedure and administered intravenously within one hour of incision. ANESTHESIA/SEDATION: Versed 3.0 mg IV; Fentanyl 100 mcg IV; Moderate Sedation Time:  28 minutes The patient was continuously monitored during the procedure by the interventional radiology nurse under my direct supervision. FLUOROSCOPY TIME:  24 seconds, 2 mGy COMPLICATIONS: None immediate. PROCEDURE: The risks of the procedure were explained to the patient. Informed consent was obtained. Patient was placed supine on the interventional table. Ultrasound confirmed a patent left internal jugular vein. Ultrasound image was saved for documentation. The left chest and neck were cleaned with a skin antiseptic and a sterile drape was placed. Maximal barrier sterile technique was utilized including caps, mask, sterile gowns, sterile gloves, sterile drape, hand hygiene and skin antiseptic. The left neck was anesthetized with 1% lidocaine. Small incision was made in the left neck with a blade. Micropuncture set was placed in the left IJ with ultrasound guidance. The micropuncture wire was used for measurement purposes. The left chest was anesthetized with 1% lidocaine with epinephrine. #15 blade was used to make an incision and a subcutaneous port pocket was formed. Talala was assembled. Subcutaneous tunnel was formed with a stiff tunneling device. The port catheter was brought through the subcutaneous tunnel. The port was placed in the subcutaneous pocket. The micropuncture set was exchanged for a peel-away sheath. The catheter was placed through the peel-away sheath and the tip was positioned at the SVC and right atrium junction. Catheter placement was confirmed with fluoroscopy. The port was accessed and flushed with heparinized saline. The port pocket was closed using two layers of absorbable sutures and Dermabond. The vein skin site was closed using a single layer of absorbable suture and  Dermabond. Sterile dressings were applied.  Patient tolerated the procedure well without an immediate complication. Ultrasound and fluoroscopic images were taken and saved for this procedure. IMPRESSION: Placement of a subcutaneous port device. Catheter tip at the SVC and right atrium junction. Electronically Signed   By: Markus Daft M.D.   On: 04/26/2019 18:13    ASSESSMENT & PLAN:   This is a 53 year old malewith  1. 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 Acute DVT- Right IJ, Right Innominate Vein, and likely also the SVC- related to malignancy+ tobacco-  on lovenox  4. Symptomatic hemorrhoids  5. Normocytic anemia -Likely due to underlying malignancy -Will check ferritin, iron studies, vitamin B12 level, and RBC folate in the a.m.  6.Thrombocytosis -- likely due to paraneoplastic effect of tumor and reactive due to inflammation and tissue inflammation from RT.-- now resolved.  7. Nicotine dependence -He was counseled about tobacco cessation.  PLAN:  -Discussed pt labwork today, 05/02/19; all values are WNL except for RBC at 4.17, hemoglobin at 11.9,  Hematocrit at 36.4, RDW at 17.3, Monocytes Absolute at 1, Alkaline Phosphatase at 137, Total bilirubin at 0.2 -Will prescribe emla ointmenttopical for port -Will refill oxycodone and lorazepam. -Advised that he is in cycle 3 day 1. Will repeat PET/CT scan before cycle 4.  -Advised that chemotherapy can cause skin peeling. Recommended staying moisturized   -Advised that he does not have any eating restrictions. -PET/CT in 2 weeks to evaluate response to treatment and further treatment strategy.  FOLLOW UP: Please schedule  neulasta appointment [id number] for 05/05/2019 PET/CT in 2 weeks Please schedule C4 of carbo/taxol.atezolizumab in 3 weeks with portflush, labs and MD visit  The total time spent in the appt was 25 minutes and more than 50% was on counseling and direct patient cares.  All of the patient's questions were answered with apparent satisfaction. The patient knows to call the clinic with any problems, questions or concerns.  Sullivan Lone MD MS AAHIVMS [facility] Hematology/Oncology Physician [facility]  (Office):       [phone] (Work cell):  [phone] (Fax):           [phone]  05/02/2019 5:46 AM  I, Scot Dock, am acting as a scribe for Dr. Sullivan Lone.   .I have reviewed the above documentation for accuracy and completeness, and I agree with the above. Brunetta Genera MD

## 2019-05-02 NOTE — Progress Notes (Signed)
Correction :  Replacement drug begins 04/12/19.

## 2019-05-03 ENCOUNTER — Telehealth: Payer: Self-pay | Admitting: Hematology

## 2019-05-03 NOTE — Telephone Encounter (Signed)
Per 12/23 los, appts already scheduled.  Scheduled another treatment based off treatment plan.

## 2019-05-07 ENCOUNTER — Other Ambulatory Visit: Payer: Self-pay

## 2019-05-07 ENCOUNTER — Inpatient Hospital Stay: Payer: Medicaid Other

## 2019-05-07 VITALS — BP 101/72 | HR 97 | Temp 98.0°F | Resp 18

## 2019-05-07 DIAGNOSIS — Z5112 Encounter for antineoplastic immunotherapy: Secondary | ICD-10-CM | POA: Diagnosis not present

## 2019-05-07 DIAGNOSIS — C3491 Malignant neoplasm of unspecified part of right bronchus or lung: Secondary | ICD-10-CM

## 2019-05-07 DIAGNOSIS — Z7189 Other specified counseling: Secondary | ICD-10-CM

## 2019-05-07 MED ORDER — PEGFILGRASTIM-CBQV 6 MG/0.6ML ~~LOC~~ SOSY
6.0000 mg | PREFILLED_SYRINGE | Freq: Once | SUBCUTANEOUS | Status: AC
  Administered 2019-05-07: 08:00:00 6 mg via SUBCUTANEOUS

## 2019-05-07 MED ORDER — PEGFILGRASTIM-CBQV 6 MG/0.6ML ~~LOC~~ SOSY
PREFILLED_SYRINGE | SUBCUTANEOUS | Status: AC
  Filled 2019-05-07: qty 0.6

## 2019-05-07 NOTE — Patient Instructions (Signed)

## 2019-05-07 NOTE — Progress Notes (Signed)
Emend not given today. Pt. Denies nausea or vomiting.

## 2019-05-16 ENCOUNTER — Other Ambulatory Visit: Payer: Self-pay

## 2019-05-16 ENCOUNTER — Ambulatory Visit (HOSPITAL_COMMUNITY)
Admission: RE | Admit: 2019-05-16 | Discharge: 2019-05-16 | Disposition: A | Discharge status: Home / Self Care | Payer: Medicaid Other | Source: Ambulatory Visit | Attending: Hematology | Admitting: Hematology

## 2019-05-16 DIAGNOSIS — C3490 Malignant neoplasm of unspecified part of unspecified bronchus or lung: Secondary | ICD-10-CM | POA: Diagnosis not present

## 2019-05-16 DIAGNOSIS — C3491 Malignant neoplasm of unspecified part of right bronchus or lung: Secondary | ICD-10-CM | POA: Insufficient documentation

## 2019-05-16 DIAGNOSIS — C781 Secondary malignant neoplasm of mediastinum: Secondary | ICD-10-CM | POA: Diagnosis not present

## 2019-05-16 LAB — GLUCOSE, CAPILLARY: Glucose-Capillary: 84 mg/dL (ref 70–99)

## 2019-05-16 IMAGING — CT NM PET TUM IMG RESTAG (PS) SKULL BASE T - THIGH
1 of 8 series · 1 of 25 positions shown · non-contrast
Comparison: CTA chest dated [DATE]. CT abdomen/pelvis dated
[DATE].

CLINICAL DATA: Subsequent treatment strategy for metastatic lung
cancer, status post chemo radiation.

EXAM:
NUCLEAR MEDICINE PET SKULL BASE TO THIGH
TECHNIQUE: 7.2 mCi F-18 FDG was injected intravenously. Full-ring PET imaging
was performed from the skull base to thigh after the radiotracer. CT
data was obtained and used for attenuation correction and anatomic
localization.
Fasting blood glucose: 84 mg/dl

[Series 4: ct sk_thigh 5.0 b31f · axial · 5.0mm · 0.98mm/px · 1 of 226 slices shown]
[im 226/226  brain]
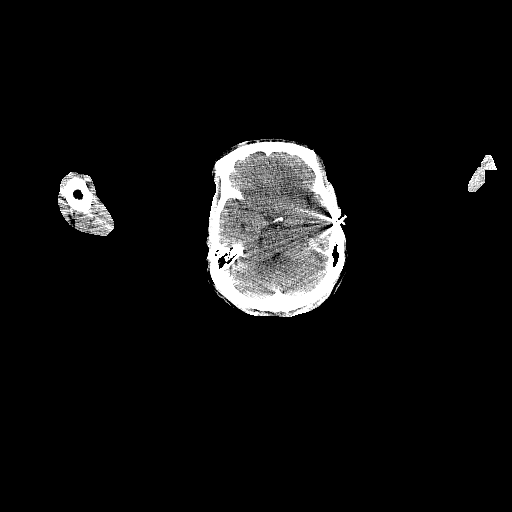

[1 of 25 positions shown; findings below may reference images not displayed]

FINDINGS: Mediastinal blood pool activity: SUV max

Liver activity: SUV max NA

NECK: No hypermetabolic cervical lymphadenopathy.

Incidental CT findings: none

CHEST: Radiation changes in the medial bilateral upper lobes and
superior segment right lower lobe. Associated hypermetabolism, max
SUV 6.3.

Abnormal soft tissue in the right paratracheal region, measuring up
to 2.1 cm short axis (series 4/image 67), previously 4.0 cm. Mild
focal hypermetabolism medially, max SUV 4.3.

9 mm short axis right azygoesophageal recess node (series 4/image
80), previously 12 mm, max SUV 5.1.

Left chest port terminates the cavoatrial junction.

Incidental CT findings: Mild atherosclerotic calcifications of the
aortic arch.

ABDOMEN/PELVIS: No hypermetabolic abdominopelvic lymphadenopathy.

No abnormal hypermetabolism in the liver, spleen, pancreas, or
adrenal glands.

Incidental CT findings: Calcified splenic granulomata. 2 mm
nonobstructing right lower pole renal calculus. Atherosclerotic
calcifications abdominal aorta and branch vessels. Thick-walled
bladder, although underdistended.

SKELETON: Diffuse/heterogeneous hypermetabolism involving the
visualized axial and appendicular skeleton, including the lower
thoracolumbar spine and pelvis, favoring marrow stimulation.

However, there is focal hypermetabolism involving the right lateral
mass of C1, max SUV 3.8. While indeterminate, metastasis not
excluded.

Incidental CT findings: none
IMPRESSION: Radiation changes in the right hemithorax, as above.

Improving mediastinal nodal metastases. Prior bulky right cervical
metastases have resolved.

No evidence of metastatic disease in the abdomen/pelvis.

Focal hypermetabolism at C1, indeterminate. Attention on follow-up
is suggested.

## 2019-05-16 MED ORDER — FLUDEOXYGLUCOSE F - 18 (FDG) INJECTION
7.1700 | Freq: Once | INTRAVENOUS | Status: AC
  Administered 2019-05-16: 7.17 via INTRAVENOUS

## 2019-05-22 NOTE — Progress Notes (Signed)
  HEMATOLOGY/ONCOLOGY CLINIC NOTE  Date of Service: 05/23/2019  Patient Care Team: Banks, Shannon R, MD as PCP - General (Family Medicine)  CHIEF COMPLAINTS/PURPOSE OF CONSULTATION:  contnued management of Metastatic Poorly differentiated lung adenocarcinoma  HISTORY OF PRESENTING ILLNESS:   David Berry is a 54-year-old male with no significant medical history.  He presented to the hospital with progressive right arm and chest pain with new right-sided neck swelling.  The patient states that he was diagnosed with carpal tunnel syndrome received an injection in August.  However, he continued to have progressive pain up and down his right arm that would also radiate to his right chest.  He took ibuprofen with no improvement.  1 week prior to admission, he developed new right sided neck swelling.  He presented to the emergency room for further evaluation.  Work-up in the emergency room was significant for a CT angiogram of the neck and chest which revealed a bulky soft tissue mass in the right neck continuing into the upper chest most compatible with extensive metastatic lymphadenopathy.  This mass measures up to 34 mm and encases multiple vessels including the right upper lobe pulmonary vessels, superior vena cava, and brachiocephalic vessel.  He also had a large poorly defined malignant appearing infiltrative mass measuring 6.2 x 5.6 x 7.9 cm within the right anterior and middle mediastinum with suspected invasion of the distal left brachiocephalic vein and probable tumor occlusion of the right subclavian and jugular veins.  There is also suspected tumor invasion of the upper SVC with string-like narrowing of the SVC but with patency of the SVC just above the right atrium.  The tumor abuts the aorta and great vessels and also results in significant narrowing of the right upper lobe pulmonary arterial vessels.  He also had a CT of the abdomen pelvis which showed a 15 mm heterogeneously enhancing lesion  in the upper pole of the right kidney concerning for renal cell carcinoma, no lymphadenopathy in the abdomen or pelvis, focal hyperenhancement of the anterior left liver most likely related to aberrant venous anatomy/flow, sclerotic lesions in the right acetabulum and left iliac bone are indeterminate.  These are likely bone islands but attention on follow-up is recommended a metastatic disease cannot be excluded.  MRI of the brain with and without contrast did not show any evidence of intracranial metastatic disease. The patient underwent a CT-guided biopsy in interventional radiology on 01/22/2019.  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   When seen today, patient reports ongoing pain and swelling to his right arm and right side of his neck.  Reports ongoing pain to his right chest.  He reports intermittent facial swelling which is worse in the morning and improves throughout the day.  He reports anorexia and a weight loss of 20 pounds over the past few weeks. He has had dizziness for the past few months.  Denies headaches.  He also noted that on the day of admission his peripheral vision was poor.  His vision is now improved.  He denies food getting stuck or difficulty swallowing but does feel as though his throat is "tight."  He reports a nonproductive cough.  Denies fevers and chills.  He is not really having any shortness of breath.  Denies abdominal pain, nausea, vomiting, constipation, diarrhea.  He has not noticed any epistaxis, hemoptysis, hematemesis, hematuria, melena, hematochezia.  The patient is single.  He has 1 daughter who lives   in [place], [place].  Reports occasional alcohol use and currently smokes 3 cigars a day since 1997.  He is currently living in a rooming house with other roommates who all have their own room.  They share a common bathroom and kitchen.  Medical oncology was asked see the  patient to make recommendations regarding his newly diagnosed poorly differentiated carcinoma.  History reviewed. No pertinent past medical history.  Current Treatment:  Atezolizumab + carbo/taxol  INTERVAL HISTORY:   David Lee Vera Jr. is a  54 y.o. male who is here for evaluation and management of metastatic poorly differentiated lung adenocarcinoma. He is here for C4 of Atezolizumab, Carboplatin and Taxol. The patient's last visit with us was on 05/02/2019. The pt reports that he is doing well overall.  The pt reports that he is having some head itching but denies any other new symptoms. Pt has continued to moisturize his skin well. He does have some intermittent hand tingling/numbness/swelling that goes away fairly quickly. He does have some mild fatigue but it is not limiting and he is able to eat well.  Of note since the patient's last visit, pt has had PET/CT ([id number]) completed on 05/16/2019 with results revealing "Radiation changes in the right hemithorax, as above. Improving mediastinal nodal metastases. Prior bulky right cervical metastases have resolved. No evidence of metastatic disease in the abdomen/pelvis. Focal hypermetabolism at C1, indeterminate. Attention on follow-up is suggested."  Lab results today (05/23/19) of CBC w/diff and CMP is as follows: all values are WNL except for RBC at 3.91, Hgb at 11.3, HCT at 35.1, RDW at 16.9, Lymphs Abs at 0.6K, Calcium at 8.8, AST at 11, Alkaline Phosphatase at 128, Total Bilirubin at 0.2. 05/23/2019 Magnesium at 1.8  On review of systems, pt reports eating well, fatigue, itching, hand tingling/numbness/swelling and denies abdominal pain, leg swelling and any other symptoms.    MEDICAL HISTORY:  Past Medical History:  Diagnosis Date  . Blood in stool   . Cancer (HCC)   . Chronic bronchitis with emphysema (HCC)   . GERD (gastroesophageal reflux disease)     SURGICAL HISTORY: Past Surgical History:  Procedure Laterality Date   . APPENDECTOMY     teenager  . INGUINAL HERNIA REPAIR Right 2016, 2017   [facility], 2018 [facility]-removed mesh  . IR IMAGING GUIDED PORT INSERTION  04/26/2019    SOCIAL HISTORY: Social History   Socioeconomic History  . Marital status: Single    Spouse name: Not on file  . Number of children: 1  . Years of education: GED  . Highest education level: Not on file  Occupational History    Comment: fork lift driver  Tobacco Use  . Smoking status: Former Smoker    Types: Cigars  . Smokeless tobacco: Never Used  . Tobacco comment: smokes 3 black and mild cigars per day  Substance and Sexual Activity  . Alcohol use: Not Currently  . Drug use: No  . Sexual activity: Yes  Other Topics Concern  . Not on file  Social History Narrative   Lives alone   Caffeine- coffee, 20 oz daily, tea occas   Social Determinants of Health   Financial Resource Strain:   . Difficulty of Paying Living Expenses: Not on file  Food Insecurity:   . Worried About Running Out of Food in the Last Year: Not on file  . Ran Out of Food in the Last Year: Not on file  Transportation Needs:   . Lack of   Transportation (Medical): Not on file  . Lack of Transportation (Non-Medical): Not on file  Physical Activity:   . Days of Exercise per Week: Not on file  . Minutes of Exercise per Session: Not on file  Stress:   . Feeling of Stress : Not on file  Social Connections:   . Frequency of Communication with Friends and Family: Not on file  . Frequency of Social Gatherings with Friends and Family: Not on file  . Attends Religious Services: Not on file  . Active Member of Clubs or Organizations: Not on file  . Attends Archivist Meetings: Not on file  . Marital Status: Not on file  Intimate Partner Violence:   . Fear of Current or Ex-Partner: Not on file  . Emotionally Abused: Not on file  . Physically Abused: Not on file  . Sexually Abused: Not on file    FAMILY HISTORY: Family History    Problem Relation Age of Onset  . Prostate cancer Father     ALLERGIES:  is allergic to pregabalin.  MEDICATIONS:  Current Outpatient Medications  Medication Sig Dispense Refill  . apixaban (ELIQUIS) 5 MG TABS tablet Take 1 tablet (5 mg total) by mouth 2 (two) times daily. 60 tablet 5  . LORazepam (ATIVAN) 0.5 MG tablet Take 1 tablet (0.5 mg total) by mouth every 4 (four) hours as needed for anxiety. 60 tablet 0  . omeprazole (PRILOSEC) 20 MG capsule Take 20 mg by mouth daily.    [name] oxyCODONE (OXY IR/ROXICODONE) 5 MG immediate release tablet Take 1-2 tablets (5-10 mg total) by mouth every 4 (four) hours as needed for severe pain. 60 tablet 0  . prochlorperazine (COMPAZINE) 10 MG tablet Take 1 tablet (10 mg total) by mouth every 6 (six) hours as needed (Nausea or vomiting). (Patient not taking: Reported on 05/23/2019) 30 tablet 1   No current facility-administered medications for this visit.    REVIEW OF SYSTEMS:   A 10+ POINT REVIEW OF SYSTEMS WAS OBTAINED including neurology, dermatology, psychiatry, cardiac, respiratory, lymph, extremities, GI, GU, Musculoskeletal, constitutional, breasts, reproductive, HEENT.  All pertinent positives are noted in the HPI.  All others are negative.   PHYSICAL EXAMINATION: ECOG FS:1 - Symptomatic but completely ambulatory  Vitals:   05/23/19 0951  BP: 98/81  Pulse: (!) 102  Resp: 18  Temp: 98.2 F (36.8 C)  SpO2: 97%   Wt Readings from Last 3 Encounters:  05/23/19 135 lb 1.6 oz (61.3 kg)  05/02/19 131 lb 9.6 oz (59.7 kg)  04/10/19 130 lb 14.4 oz (59.4 kg)   Body mass index is 18.84 kg/m.    GENERAL:alert, in no acute distress and comfortable SKIN: no acute rashes, no significant lesions EYES: conjunctiva are pink and non-injected, sclera anicteric OROPHARYNX: MMM, no exudates, no oropharyngeal erythema or ulceration NECK: supple, no JVD LYMPH:  no palpable lymphadenopathy in the cervical, axillary or inguinal regions LUNGS: clear to  auscultation b/l with normal respiratory effort HEART: regular rate & rhythm ABDOMEN:  normoactive bowel sounds , non tender, not distended. No palpable hepatosplenomegaly.  Extremity: no pedal edema PSYCH: alert & oriented x 3 with fluent speech NEURO: no focal motor/sensory deficits  LABORATORY DATA:  I have reviewed the data as listed  . CBC Latest Ref Rng & Units 05/23/2019 05/02/2019 04/26/2019  WBC 4.0 - 10.5 K/uL 4.2 6.9 7.5  Hemoglobin 13.0 - 17.0 g/dL 11.3(L) 11.9(L) 12.3(L)  Hematocrit 39.0 - 52.0 % 35.1(L) 36.4(L) 38.3(L)  Platelets 150 - 400  K/uL 265 351 298    . CMP Latest Ref Rng & Units 05/23/2019 05/02/2019 04/10/2019  Glucose 70 - 99 mg/dL 93 85 97  BUN 6 - 20 mg/dL _0 Creatinine 0.61 - 1.24 mg/dL 0.78 0.72 0.76  Sodium 135 - 145 mmol/L 138 139 138  Potassium 3.5 - 5.1 mmol/L 3.8 4.2 4.2  Chloride 98 - 111 mmol/L 105 103 105  CO2 22 - 32 mmol/L _1 Calcium 8.9 - 10.3 mg/dL 8.8(L) 9.7 9.2  Total Protein 6.5 - 8.1 g/dL 6.8 7.3 7.0  Total Bilirubin 0.3 - 1.2 mg/dL 0.2(L) 0.2(L) <0.2(L)  Alkaline Phos 38 - 126 U/L 128(H) 137(H) 103  AST 15 - 41 U/L 11(L) 19 13(L)  ALT 0 - 44 U/L _2 01/22/2019 [facility]: Tumor Mutational Burden     01/22/2019 PD-L1 Immunohistochemistry Analysis    01/22/2019 Soft Tissue Needle Core Biopsy Surgical Pathology     RADIOGRAPHIC STUDIES: I have personally reviewed the radiological images as listed and agreed with the findings in the report. NM PET Image Restag (PS) Skull Base To Thigh  Result Date: 05/16/2019 CLINICAL DATA:  Subsequent treatment strategy for metastatic lung cancer, status post chemo radiation. EXAM: NUCLEAR MEDICINE PET SKULL BASE TO THIGH TECHNIQUE: 7.2 mCi F-18 FDG was injected intravenously. Full-ring PET imaging was performed from the skull base to thigh after the radiotracer. CT data was obtained and used for attenuation correction and anatomic localization. Fasting blood glucose: 84  mg/dl COMPARISON:  CTA chest dated 01/20/2019. CT abdomen/pelvis dated 01/21/2019. FINDINGS: Mediastinal blood pool activity: SUV max 1.7 Liver activity: SUV max NA NECK: No hypermetabolic cervical lymphadenopathy. Incidental CT findings: none CHEST: Radiation changes in the medial bilateral upper lobes and superior segment right lower lobe. Associated hypermetabolism, max SUV 6.3. Abnormal soft tissue in the right paratracheal region, measuring up to 2.1 cm short axis (series 4/image 67), previously 4.0 cm. Mild focal hypermetabolism medially, max SUV 4.3. 9 mm short axis right azygoesophageal recess node (series 4/image 80), previously 12 mm, max SUV 5.1. Left chest port terminates the cavoatrial junction. Incidental CT findings: Mild atherosclerotic calcifications of the aortic arch. ABDOMEN/PELVIS: No hypermetabolic abdominopelvic lymphadenopathy. No abnormal hypermetabolism in the liver, spleen, pancreas, or adrenal glands. Incidental CT findings: Calcified splenic granulomata. 2 mm nonobstructing right lower pole renal calculus. Atherosclerotic calcifications abdominal aorta and branch vessels. Thick-walled bladder, although underdistended. SKELETON: Diffuse/heterogeneous hypermetabolism involving the visualized axial and appendicular skeleton, including the lower thoracolumbar spine and pelvis, favoring marrow stimulation. However, there is focal hypermetabolism involving the right lateral mass of C1, max SUV 3.8. While indeterminate, metastasis not excluded. Incidental CT findings: none IMPRESSION: Radiation changes in the right hemithorax, as above. Improving mediastinal nodal metastases. Prior bulky right cervical metastases have resolved. No evidence of metastatic disease in the abdomen/pelvis. Focal hypermetabolism at C1, indeterminate. Attention on follow-up is suggested. Electronically Signed   By: Julian Hy M.D.   On: 05/16/2019 16:50   IR IMAGING GUIDED PORT INSERTION  Result Date:  04/26/2019 INDICATION: 54 year old with lung cancer.  Port-A-Cath needed for chemotherapy. EXAM: FLUOROSCOPIC AND ULTRASOUND GUIDED PLACEMENT OF A SUBCUTANEOUS PORT. Physician: Stephan Minister. Anselm Pancoast, MD MEDICATIONS: Ancef 2 g; As antibiotic prophylaxis, Ancef 1 gm was ordered pre-procedure and administered intravenously within one hour of incision. ANESTHESIA/SEDATION: Versed 3.0 mg IV; Fentanyl 100 mcg IV; Moderate Sedation Time:  28 minutes The patient was continuously monitored during the procedure by the interventional radiology nurse  under my direct supervision. FLUOROSCOPY TIME:  24 seconds, 2 mGy COMPLICATIONS: None immediate. PROCEDURE: The risks of the procedure were explained to the patient. Informed consent was obtained. Patient was placed supine on the interventional table. Ultrasound confirmed a patent left internal jugular vein. Ultrasound image was saved for documentation. The left chest and neck were cleaned with a skin antiseptic and a sterile drape was placed. Maximal barrier sterile technique was utilized including caps, mask, sterile gowns, sterile gloves, sterile drape, hand hygiene and skin antiseptic. The left neck was anesthetized with 1% lidocaine. Small incision was made in the left neck with a blade. Micropuncture set was placed in the left IJ with ultrasound guidance. The micropuncture wire was used for measurement purposes. The left chest was anesthetized with 1% lidocaine with epinephrine. #15 blade was used to make an incision and a subcutaneous port pocket was formed. 8 french Power Port was assembled. Subcutaneous tunnel was formed with a stiff tunneling device. The port catheter was brought through the subcutaneous tunnel. The port was placed in the subcutaneous pocket. The micropuncture set was exchanged for a peel-away sheath. The catheter was placed through the peel-away sheath and the tip was positioned at the SVC and right atrium junction. Catheter placement was confirmed with  fluoroscopy. The port was accessed and flushed with heparinized saline. The port pocket was closed using two layers of absorbable sutures and Dermabond. The vein skin site was closed using a single layer of absorbable suture and Dermabond. Sterile dressings were applied. Patient tolerated the procedure well without an immediate complication. Ultrasound and fluoroscopic images were taken and saved for this procedure. IMPRESSION: Placement of a subcutaneous port device. Catheter tip at the SVC and right atrium junction. Electronically Signed   By: Adam  Henn M.D.   On: 04/26/2019 18:13    ASSESSMENT & PLAN:   This is a 53-year-old malewith  1. 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 Acute DVT- Right IJ, Right Innominate Vein, and likely also the SVC- related to malignancy+ tobacco-  on lovenox  4. Symptomatic hemorrhoids  5. Normocytic anemia -Likely due to underlying malignancy -Will check ferritin, iron studies, vitamin B12 level, and RBC folate in the a.m.  6.Thrombocytosis -- likely due to paraneoplastic effect of tumor and reactive due to inflammation and tissue inflammation from RT.-- now resolved.  7. Nicotine dependence -He was counseled about tobacco cessation.  PLAN: -Discussed pt labwork today, 05/23/19; blood counts and chemistries are steady, Magnesium is low nml -Discussed  05/16/2019 PET/CT ([id number]) which revealed "Radiation changes in the right hemithorax, as above. Improving mediastinal nodal metastases. Prior bulky right cervical metastases have resolved. No evidence of metastatic disease in the abdomen/pelvis." -No lymphadenopathy found on clinical  exam -Advised that chemotherapy can cause skin peeling/dryness, continue to stay moisturized   -The pt has no prohibitive toxicities from continuing C4D1 of Atezolizumab, Carboplatin and Taxol at this time  -Plan to finish 6 cycles of treatment with carboplatin/taxol and Atezolizumab and would then continue Atezolizumab for maintenance till progression/intolerance. -Will see back in 3 weeks with labs   FOLLOW UP: Plz schedule C5 of carbo/taxol.atezolizumab and neulasta (as ordered) in 3 weeks with portflush, labs and MD visit   The total time spent in the   appt was 30 minutes and more than 50% was on counseling and direct patient cares.  All of the patient's questions were answered with apparent satisfaction. The patient knows to call the clinic with any problems, questions or concerns.     MD MS AAHIVMS SCH CTH Hematology/Oncology Physician [facility]  (Office):       [phone] (Work cell):  [phone] (Fax):           [phone]  05/23/2019 10:47 AM  I, Jazzmine Knight, am acting as a scribe for Dr.  .   .I have reviewed the above documentation for accuracy and completeness, and I agree with the above. . Kishore  MD            

## 2019-05-23 ENCOUNTER — Inpatient Hospital Stay: Payer: Medicaid Other

## 2019-05-23 ENCOUNTER — Telehealth: Payer: Self-pay | Admitting: Hematology

## 2019-05-23 ENCOUNTER — Other Ambulatory Visit: Payer: Self-pay

## 2019-05-23 ENCOUNTER — Encounter: Payer: Self-pay | Admitting: Hematology

## 2019-05-23 ENCOUNTER — Inpatient Hospital Stay (HOSPITAL_BASED_OUTPATIENT_CLINIC_OR_DEPARTMENT_OTHER): Payer: Medicaid Other | Admitting: Hematology

## 2019-05-23 ENCOUNTER — Inpatient Hospital Stay: Payer: Medicaid Other | Attending: Hematology

## 2019-05-23 VITALS — BP 98/81 | HR 102 | Temp 98.2°F | Resp 18 | Ht 71.0 in | Wt 135.1 lb

## 2019-05-23 VITALS — HR 94

## 2019-05-23 DIAGNOSIS — C3491 Malignant neoplasm of unspecified part of right bronchus or lung: Secondary | ICD-10-CM

## 2019-05-23 DIAGNOSIS — Z87891 Personal history of nicotine dependence: Secondary | ICD-10-CM | POA: Diagnosis not present

## 2019-05-23 DIAGNOSIS — Z86718 Personal history of other venous thrombosis and embolism: Secondary | ICD-10-CM | POA: Insufficient documentation

## 2019-05-23 DIAGNOSIS — Z7901 Long term (current) use of anticoagulants: Secondary | ICD-10-CM | POA: Insufficient documentation

## 2019-05-23 DIAGNOSIS — Z5189 Encounter for other specified aftercare: Secondary | ICD-10-CM | POA: Diagnosis not present

## 2019-05-23 DIAGNOSIS — Z5111 Encounter for antineoplastic chemotherapy: Secondary | ICD-10-CM

## 2019-05-23 DIAGNOSIS — Z7189 Other specified counseling: Secondary | ICD-10-CM

## 2019-05-23 DIAGNOSIS — D649 Anemia, unspecified: Secondary | ICD-10-CM | POA: Diagnosis not present

## 2019-05-23 DIAGNOSIS — Z79899 Other long term (current) drug therapy: Secondary | ICD-10-CM | POA: Insufficient documentation

## 2019-05-23 DIAGNOSIS — C3411 Malignant neoplasm of upper lobe, right bronchus or lung: Secondary | ICD-10-CM | POA: Insufficient documentation

## 2019-05-23 DIAGNOSIS — Z5112 Encounter for antineoplastic immunotherapy: Secondary | ICD-10-CM | POA: Insufficient documentation

## 2019-05-23 DIAGNOSIS — I82621 Acute embolism and thrombosis of deep veins of right upper extremity: Secondary | ICD-10-CM

## 2019-05-23 DIAGNOSIS — Z8042 Family history of malignant neoplasm of prostate: Secondary | ICD-10-CM | POA: Diagnosis not present

## 2019-05-23 LAB — CMP (CANCER CENTER ONLY)
ALT: 13 U/L (ref 0–44)
AST: 11 U/L — ABNORMAL LOW (ref 15–41)
Albumin: 3.5 g/dL (ref 3.5–5.0)
Alkaline Phosphatase: 128 U/L — ABNORMAL HIGH (ref 38–126)
Anion gap: 9 (ref 5–15)
BUN: 10 mg/dL (ref 6–20)
CO2: 24 mmol/L (ref 22–32)
Calcium: 8.8 mg/dL — ABNORMAL LOW (ref 8.9–10.3)
Chloride: 105 mmol/L (ref 98–111)
Creatinine: 0.78 mg/dL (ref 0.61–1.24)
GFR, Est AFR Am: >60 mL/min (ref >60)
GFR, Estimated: >60 mL/min (ref >60)
Glucose, Bld: 93 mg/dL (ref 70–99)
Potassium: 3.8 mmol/L (ref 3.5–5.1)
Sodium: 138 mmol/L (ref 135–145)
Total Bilirubin: 0.2 mg/dL — ABNORMAL LOW (ref 0.3–1.2)
Total Protein: 6.8 g/dL (ref 6.5–8.1)

## 2019-05-23 LAB — CBC WITH DIFFERENTIAL/PLATELET
Abs Immature Granulocytes: 0.01 10*3/uL (ref 0.00–0.07)
Basophils Absolute: 0 10*3/uL (ref 0.0–0.1)
Basophils Relative: 1 %
Eosinophils Absolute: 0 10*3/uL (ref 0.0–0.5)
Eosinophils Relative: 1 %
HCT: 35.1 % — ABNORMAL LOW (ref 39.0–52.0)
Hemoglobin: 11.3 g/dL — ABNORMAL LOW (ref 13.0–17.0)
Immature Granulocytes: 0 %
Lymphocytes Relative: 15 %
Lymphs Abs: 0.6 10*3/uL — ABNORMAL LOW (ref 0.7–4.0)
MCH: 28.9 pg (ref 26.0–34.0)
MCHC: 32.2 g/dL (ref 30.0–36.0)
MCV: 89.8 fL (ref 80.0–100.0)
Monocytes Absolute: 0.9 10*3/uL (ref 0.1–1.0)
Monocytes Relative: 20 %
Neutro Abs: 2.7 10*3/uL (ref 1.7–7.7)
Neutrophils Relative %: 63 %
Platelets: 265 10*3/uL (ref 150–400)
RBC: 3.91 MIL/uL — ABNORMAL LOW (ref 4.22–5.81)
RDW: 16.9 % — ABNORMAL HIGH (ref 11.5–15.5)
WBC: 4.2 10*3/uL (ref 4.0–10.5)
nRBC: 0 % (ref 0.0–0.2)

## 2019-05-23 LAB — MAGNESIUM: Magnesium: 1.8 mg/dL (ref 1.7–2.4)

## 2019-05-23 MED ORDER — DIPHENHYDRAMINE HCL 50 MG/ML IJ SOLN
INTRAMUSCULAR | Status: AC
  Filled 2019-05-23: qty 1

## 2019-05-23 MED ORDER — PALONOSETRON HCL INJECTION 0.25 MG/5ML
INTRAVENOUS | Status: AC
  Filled 2019-05-23: qty 5

## 2019-05-23 MED ORDER — SODIUM CHLORIDE 0.9 % IV SOLN
1200.0000 mg | Freq: Once | INTRAVENOUS | Status: AC
  Administered 2019-05-23: 1200 mg via INTRAVENOUS
  Filled 2019-05-23: qty 20

## 2019-05-23 MED ORDER — HEPARIN SOD (PORK) LOCK FLUSH 100 UNIT/ML IV SOLN
500.0000 [IU] | Freq: Once | INTRAVENOUS | Status: AC | PRN
  Administered 2019-05-23: 500 [IU]
  Filled 2019-05-23: qty 5

## 2019-05-23 MED ORDER — FAMOTIDINE IN NACL 20-0.9 MG/50ML-% IV SOLN
20.0000 mg | Freq: Once | INTRAVENOUS | Status: AC
  Administered 2019-05-23: 20 mg via INTRAVENOUS

## 2019-05-23 MED ORDER — FAMOTIDINE IN NACL 20-0.9 MG/50ML-% IV SOLN
INTRAVENOUS | Status: AC
  Filled 2019-05-23: qty 50

## 2019-05-23 MED ORDER — DIPHENHYDRAMINE HCL 50 MG/ML IJ SOLN
50.0000 mg | Freq: Once | INTRAMUSCULAR | Status: AC
  Administered 2019-05-23: 50 mg via INTRAVENOUS

## 2019-05-23 MED ORDER — SODIUM CHLORIDE 0.9 % IV SOLN
135.0000 mg/m2 | Freq: Once | INTRAVENOUS | Status: AC
  Administered 2019-05-23: 228 mg via INTRAVENOUS
  Filled 2019-05-23: qty 38

## 2019-05-23 MED ORDER — SODIUM CHLORIDE 0.9 % IV SOLN
Freq: Once | INTRAVENOUS | Status: AC
  Filled 2019-05-23: qty 5

## 2019-05-23 MED ORDER — SODIUM CHLORIDE 0.9 % IV SOLN
550.0000 mg | Freq: Once | INTRAVENOUS | Status: AC
  Administered 2019-05-23: 550 mg via INTRAVENOUS
  Filled 2019-05-23: qty 55

## 2019-05-23 MED ORDER — SODIUM CHLORIDE 0.9 % IV SOLN
Freq: Once | INTRAVENOUS | Status: AC
  Filled 2019-05-23: qty 250

## 2019-05-23 MED ORDER — PALONOSETRON HCL INJECTION 0.25 MG/5ML
0.2500 mg | Freq: Once | INTRAVENOUS | Status: AC
  Administered 2019-05-23: 0.25 mg via INTRAVENOUS

## 2019-05-23 MED ORDER — SODIUM CHLORIDE 0.9% FLUSH
10.0000 mL | INTRAVENOUS | Status: DC | PRN
  Administered 2019-05-23: 10 mL
  Filled 2019-05-23: qty 10

## 2019-05-23 NOTE — Patient Instructions (Signed)
[facility] Discharge Instructions for Patients Receiving Chemotherapy  Today you received the following chemotherapy agents:  Atezolizumab Gildardo Pounds), Paclitaxel (Taxol), Carboplatin  To help prevent nausea and vomiting after your treatment, we encourage you to take your nausea medication as prescribed.   If you develop nausea and vomiting that is not controlled by your nausea medication, call the clinic.   BELOW ARE SYMPTOMS THAT SHOULD BE REPORTED IMMEDIATELY:  *FEVER GREATER THAN 100.5 F  *CHILLS WITH OR WITHOUT FEVER  NAUSEA AND VOMITING THAT IS NOT CONTROLLED WITH YOUR NAUSEA MEDICATION  *UNUSUAL SHORTNESS OF BREATH  *UNUSUAL BRUISING OR BLEEDING  TENDERNESS IN MOUTH AND THROAT WITH OR WITHOUT PRESENCE OF ULCERS  *URINARY PROBLEMS  *BOWEL PROBLEMS  UNUSUAL RASH Items with * indicate a potential emergency and should be followed up as soon as possible.  Feel free to call the clinic should you have any questions or concerns. The clinic phone number is [phone].  Please show the [place] at check-in to the Emergency Department and triage nurse.

## 2019-05-23 NOTE — Telephone Encounter (Signed)
Scheduled appt per 11/13 los.

## 2019-05-25 ENCOUNTER — Other Ambulatory Visit: Payer: Self-pay

## 2019-05-25 ENCOUNTER — Inpatient Hospital Stay: Payer: Medicaid Other

## 2019-05-25 VITALS — BP 95/64 | HR 86 | Temp 98.4°F | Resp 20

## 2019-05-25 DIAGNOSIS — C3491 Malignant neoplasm of unspecified part of right bronchus or lung: Secondary | ICD-10-CM

## 2019-05-25 DIAGNOSIS — Z5112 Encounter for antineoplastic immunotherapy: Secondary | ICD-10-CM | POA: Diagnosis not present

## 2019-05-25 DIAGNOSIS — Z7189 Other specified counseling: Secondary | ICD-10-CM

## 2019-05-25 MED ORDER — PEGFILGRASTIM-CBQV 6 MG/0.6ML ~~LOC~~ SOSY
6.0000 mg | PREFILLED_SYRINGE | Freq: Once | SUBCUTANEOUS | Status: AC
  Administered 2019-05-25: 6 mg via SUBCUTANEOUS

## 2019-05-25 MED ORDER — PEGFILGRASTIM-CBQV 6 MG/0.6ML ~~LOC~~ SOSY
PREFILLED_SYRINGE | SUBCUTANEOUS | Status: AC
  Filled 2019-05-25: qty 0.6

## 2019-05-25 NOTE — Patient Instructions (Signed)

## 2019-05-28 ENCOUNTER — Telehealth: Payer: Self-pay | Admitting: *Deleted

## 2019-05-28 NOTE — Telephone Encounter (Signed)
Called to ask if ok to take different type of stool softer - currently takes docusate. States he does not need laxative effect. Asked if ok to take 'gummie' variety stool softener or Miralax. Advised that either is ok, but that Miralax can cause a laxative effect for some. Patient verbalized understanding.

## 2019-06-13 ENCOUNTER — Inpatient Hospital Stay (HOSPITAL_BASED_OUTPATIENT_CLINIC_OR_DEPARTMENT_OTHER): Payer: Medicaid Other | Admitting: Hematology

## 2019-06-13 ENCOUNTER — Inpatient Hospital Stay: Payer: Medicaid Other

## 2019-06-13 ENCOUNTER — Other Ambulatory Visit: Payer: Self-pay

## 2019-06-13 ENCOUNTER — Telehealth: Payer: Self-pay | Admitting: Hematology

## 2019-06-13 ENCOUNTER — Inpatient Hospital Stay: Payer: Medicaid Other | Attending: Hematology

## 2019-06-13 VITALS — BP 98/62 | HR 94 | Temp 98.0°F | Resp 18 | Ht 71.0 in | Wt 135.1 lb

## 2019-06-13 DIAGNOSIS — Z8042 Family history of malignant neoplasm of prostate: Secondary | ICD-10-CM | POA: Diagnosis not present

## 2019-06-13 DIAGNOSIS — Z7189 Other specified counseling: Secondary | ICD-10-CM

## 2019-06-13 DIAGNOSIS — D649 Anemia, unspecified: Secondary | ICD-10-CM | POA: Diagnosis not present

## 2019-06-13 DIAGNOSIS — Z5111 Encounter for antineoplastic chemotherapy: Secondary | ICD-10-CM | POA: Insufficient documentation

## 2019-06-13 DIAGNOSIS — Z95828 Presence of other vascular implants and grafts: Secondary | ICD-10-CM

## 2019-06-13 DIAGNOSIS — Z5112 Encounter for antineoplastic immunotherapy: Secondary | ICD-10-CM | POA: Diagnosis not present

## 2019-06-13 DIAGNOSIS — Z86718 Personal history of other venous thrombosis and embolism: Secondary | ICD-10-CM | POA: Insufficient documentation

## 2019-06-13 DIAGNOSIS — Z79899 Other long term (current) drug therapy: Secondary | ICD-10-CM | POA: Diagnosis not present

## 2019-06-13 DIAGNOSIS — C3491 Malignant neoplasm of unspecified part of right bronchus or lung: Secondary | ICD-10-CM

## 2019-06-13 DIAGNOSIS — Z87891 Personal history of nicotine dependence: Secondary | ICD-10-CM | POA: Diagnosis not present

## 2019-06-13 DIAGNOSIS — C3411 Malignant neoplasm of upper lobe, right bronchus or lung: Secondary | ICD-10-CM | POA: Diagnosis present

## 2019-06-13 DIAGNOSIS — Z5189 Encounter for other specified aftercare: Secondary | ICD-10-CM | POA: Insufficient documentation

## 2019-06-13 LAB — CMP (CANCER CENTER ONLY)
ALT: 12 U/L (ref 0–44)
AST: 13 U/L — ABNORMAL LOW (ref 15–41)
Albumin: 3.8 g/dL (ref 3.5–5.0)
Alkaline Phosphatase: 136 U/L — ABNORMAL HIGH (ref 38–126)
Anion gap: 6 (ref 5–15)
BUN: 7 mg/dL (ref 6–20)
CO2: 28 mmol/L (ref 22–32)
Calcium: 9.6 mg/dL (ref 8.9–10.3)
Chloride: 104 mmol/L (ref 98–111)
Creatinine: 0.78 mg/dL (ref 0.61–1.24)
GFR, Est AFR Am: >60 mL/min (ref >60)
GFR, Estimated: >60 mL/min (ref >60)
Glucose, Bld: 96 mg/dL (ref 70–99)
Potassium: 4.1 mmol/L (ref 3.5–5.1)
Sodium: 138 mmol/L (ref 135–145)
Total Bilirubin: 0.2 mg/dL — ABNORMAL LOW (ref 0.3–1.2)
Total Protein: 7.3 g/dL (ref 6.5–8.1)

## 2019-06-13 LAB — CBC WITH DIFFERENTIAL/PLATELET
Abs Immature Granulocytes: 0.02 10*3/uL (ref 0.00–0.07)
Basophils Absolute: 0 10*3/uL (ref 0.0–0.1)
Basophils Relative: 1 %
Eosinophils Absolute: 0 10*3/uL (ref 0.0–0.5)
Eosinophils Relative: 0 %
HCT: 39 % (ref 39.0–52.0)
Hemoglobin: 12.3 g/dL — ABNORMAL LOW (ref 13.0–17.0)
Immature Granulocytes: 0 %
Lymphocytes Relative: 14 %
Lymphs Abs: 0.8 10*3/uL (ref 0.7–4.0)
MCH: 29.4 pg (ref 26.0–34.0)
MCHC: 31.5 g/dL (ref 30.0–36.0)
MCV: 93.3 fL (ref 80.0–100.0)
Monocytes Absolute: 1.3 10*3/uL — ABNORMAL HIGH (ref 0.1–1.0)
Monocytes Relative: 22 %
Neutro Abs: 3.7 10*3/uL (ref 1.7–7.7)
Neutrophils Relative %: 63 %
Platelets: 344 10*3/uL (ref 150–400)
RBC: 4.18 MIL/uL — ABNORMAL LOW (ref 4.22–5.81)
RDW: 16.5 % — ABNORMAL HIGH (ref 11.5–15.5)
WBC: 5.9 10*3/uL (ref 4.0–10.5)
nRBC: 0 % (ref 0.0–0.2)

## 2019-06-13 LAB — MAGNESIUM: Magnesium: 1.9 mg/dL (ref 1.7–2.4)

## 2019-06-13 MED ORDER — DIPHENHYDRAMINE HCL 50 MG/ML IJ SOLN
INTRAMUSCULAR | Status: AC
  Filled 2019-06-13: qty 1

## 2019-06-13 MED ORDER — FAMOTIDINE IN NACL 20-0.9 MG/50ML-% IV SOLN
INTRAVENOUS | Status: AC
  Filled 2019-06-13: qty 50

## 2019-06-13 MED ORDER — HEPARIN SOD (PORK) LOCK FLUSH 100 UNIT/ML IV SOLN
500.0000 [IU] | Freq: Once | INTRAVENOUS | Status: AC | PRN
  Administered 2019-06-13: 17:00:00 500 [IU]
  Filled 2019-06-13: qty 5

## 2019-06-13 MED ORDER — PALONOSETRON HCL INJECTION 0.25 MG/5ML
INTRAVENOUS | Status: AC
  Filled 2019-06-13: qty 5

## 2019-06-13 MED ORDER — SODIUM CHLORIDE 0.9 % IV SOLN
550.0000 mg | Freq: Once | INTRAVENOUS | Status: AC
  Administered 2019-06-13: 550 mg via INTRAVENOUS
  Filled 2019-06-13: qty 55

## 2019-06-13 MED ORDER — SODIUM CHLORIDE 0.9 % IV SOLN
1200.0000 mg | Freq: Once | INTRAVENOUS | Status: AC
  Administered 2019-06-13: 1200 mg via INTRAVENOUS
  Filled 2019-06-13: qty 20

## 2019-06-13 MED ORDER — SODIUM CHLORIDE 0.9 % IV SOLN
Freq: Once | INTRAVENOUS | Status: AC
  Filled 2019-06-13: qty 5

## 2019-06-13 MED ORDER — SODIUM CHLORIDE 0.9 % IV SOLN
Freq: Once | INTRAVENOUS | Status: AC
  Filled 2019-06-13: qty 250

## 2019-06-13 MED ORDER — FAMOTIDINE IN NACL 20-0.9 MG/50ML-% IV SOLN
20.0000 mg | Freq: Once | INTRAVENOUS | Status: AC
  Administered 2019-06-13: 11:00:00 20 mg via INTRAVENOUS

## 2019-06-13 MED ORDER — PALONOSETRON HCL INJECTION 0.25 MG/5ML
0.2500 mg | Freq: Once | INTRAVENOUS | Status: AC
  Administered 2019-06-13: 11:00:00 0.25 mg via INTRAVENOUS

## 2019-06-13 MED ORDER — SODIUM CHLORIDE 0.9% FLUSH
10.0000 mL | INTRAVENOUS | Status: DC | PRN
  Administered 2019-06-13: 09:00:00 10 mL
  Filled 2019-06-13: qty 10

## 2019-06-13 MED ORDER — SODIUM CHLORIDE 0.9 % IV SOLN
135.0000 mg/m2 | Freq: Once | INTRAVENOUS | Status: AC
  Administered 2019-06-13: 13:00:00 228 mg via INTRAVENOUS
  Filled 2019-06-13: qty 38

## 2019-06-13 MED ORDER — SODIUM CHLORIDE 0.9% FLUSH
10.0000 mL | INTRAVENOUS | Status: DC | PRN
  Administered 2019-06-13: 17:00:00 10 mL
  Filled 2019-06-13: qty 10

## 2019-06-13 MED ORDER — DIPHENHYDRAMINE HCL 50 MG/ML IJ SOLN
50.0000 mg | Freq: Once | INTRAMUSCULAR | Status: AC
  Administered 2019-06-13: 50 mg via INTRAVENOUS

## 2019-06-13 NOTE — Telephone Encounter (Signed)
Appointments already scheduled per 02/03 los during check out.

## 2019-06-13 NOTE — Progress Notes (Signed)
HEMATOLOGY/ONCOLOGY CLINIC NOTE  Date of Service: 06/13/2019  Patient Care Team: [name], MD as PCP - General (Family Medicine)  CHIEF COMPLAINTS/PURPOSE OF CONSULTATION:  contnued management of Metastatic Poorly differentiated lung adenocarcinoma  HISTORY OF PRESENTING ILLNESS:   [name] is a 54 year old male with no significant medical history.  He presented to the hospital with progressive right arm and chest pain with new right-sided neck swelling.  The patient states that he was diagnosed with carpal tunnel syndrome received an injection in August.  However, he continued to have progressive pain up and down his right arm that would also radiate to his right chest.  He took ibuprofen with no improvement.  1 week prior to admission, he developed new right sided neck swelling.  He presented to the emergency room for further evaluation.  Work-up in the emergency room was significant for a CT angiogram of the neck and chest which revealed a bulky soft tissue mass in the right neck continuing into the upper chest most compatible with extensive metastatic lymphadenopathy.  This mass measures up to 34 mm and encases multiple vessels including the right upper lobe pulmonary vessels, superior vena cava, and brachiocephalic vessel.  He also had a large poorly defined malignant appearing infiltrative mass measuring 6.2 x 5.6 x 7.9 cm within the right anterior and middle mediastinum with suspected invasion of the distal left brachiocephalic vein and probable tumor occlusion of the right subclavian and jugular veins.  There is also suspected tumor invasion of the upper SVC with string-like narrowing of the SVC but with patency of the SVC just above the right atrium.  The tumor abuts the aorta and great vessels and also results in significant narrowing of the right upper lobe pulmonary arterial vessels.  He also had a CT of the abdomen pelvis which showed a 15 mm heterogeneously enhancing lesion in  the upper pole of the right kidney concerning for renal cell carcinoma, no lymphadenopathy in the abdomen or pelvis, focal hyperenhancement of the anterior left liver most likely related to aberrant venous anatomy/flow, sclerotic lesions in the right acetabulum and left iliac bone are indeterminate.  These are likely bone islands but attention on follow-up is recommended a metastatic disease cannot be excluded.  MRI of the brain with and without contrast did not show any evidence of intracranial metastatic disease. The patient underwent a CT-guided biopsy in interventional radiology on 01/22/2019.  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   When seen today, patient reports ongoing pain and swelling to his right arm and right side of his neck.  Reports ongoing pain to his right chest.  He reports intermittent facial swelling which is worse in the morning and improves throughout the day.  He reports anorexia and a weight loss of 20 pounds over the past few weeks. He has had dizziness for the past few months.  Denies headaches.  He also noted that on the day of admission his peripheral vision was poor.  His vision is now improved.  He denies food getting stuck or difficulty swallowing but does feel as though his throat is "tight."  He reports a nonproductive cough.  Denies fevers and chills.  He is not really having any shortness of breath.  Denies abdominal pain, nausea, vomiting, constipation, diarrhea.  He has not noticed any epistaxis, hemoptysis, hematemesis, hematuria, melena, hematochezia.  The patient is single.  He has 1 daughter who lives  in [place], [place].  Reports occasional alcohol use and currently smokes 3 cigars a day since 1997.  He is currently living in a rooming house with other roommates who all have their own room.  They share a common bathroom and kitchen.  Medical oncology was asked see the patient  to make recommendations regarding his newly diagnosed poorly differentiated carcinoma.  History reviewed. No pertinent past medical history.  Current Treatment:  Atezolizumab + carbo/taxol  INTERVAL HISTORY:   David Berry. is a  54 y.o. male who is here for evaluation and management of metastatic poorly differentiated lung adenocarcinoma. He is here for C5 of Atezolizumab, Carboplatin and Taxol. The patient's last visit with [place] was on 05/23/2019. The pt reports that he is doing well overall.  The pt reports that he has had no new concerns in the interim. He did have significant pain after his last Neulasta injection. He has some nasal sores from wiping his nose for which he uses triple antibiotic ointment.   Lab results today (06/13/19) of CBC w/diff and CMP is as follows: all values are WNL except for RBC at 4.18, Hgb at 12.3, RDW at 16.5, Mono Abs at 1.3K, AST at 13, ALP at 136, Total Bilirubin at 0.2. 06/13/2019 Magnesium at 1.9   On review of systems, pt reports nasal sores and denies leg swelling, new tingling/numbness in hands/feet, diarrhea, SOB, fevers, chills, mouth sores, trouble passing urine, pain at port site and any other symptoms.   MEDICAL HISTORY:  Past Medical History:  Diagnosis Date  . Blood in stool   . Cancer ([place])   . Chronic bronchitis with emphysema ([place])   . GERD (gastroesophageal reflux disease)     SURGICAL HISTORY: Past Surgical History:  Procedure Laterality Date  . APPENDECTOMY     teenager  . INGUINAL HERNIA REPAIR Right 2016, 2017   [facility], 2018 [facility] mesh  . IR IMAGING GUIDED PORT INSERTION  04/26/2019    SOCIAL HISTORY: Social History   Socioeconomic History  . Marital status: Single    Spouse name: Not on file  . Number of children: 1  . Years of education: GED  . Highest education level: Not on file  Occupational History    Comment: fork lift driver  Tobacco Use  . Smoking status: Former Smoker     Types: Cigars  . Smokeless tobacco: Never Used  . Tobacco comment: smokes 3 black and mild cigars per day  Substance and Sexual Activity  . Alcohol use: Not Currently  . Drug use: No  . Sexual activity: Yes  Other Topics Concern  . Not on file  Social History Narrative   Lives alone   Caffeine- coffee, 20 oz daily, tea occas   Social Determinants of Health   Financial Resource Strain:   . Difficulty of Paying Living Expenses: Not on file  Food Insecurity:   . Worried About Charity fundraiser in the Last Year: Not on file  . Ran Out of Food in the Last Year: Not on file  Transportation Needs:   . Lack of Transportation (Medical): Not on file  . Lack of Transportation (Non-Medical): Not on file  Physical Activity:   . Days of Exercise per Week: Not on file  . Minutes of Exercise per Session: Not on file  Stress:   . Feeling of Stress : Not on file  Social Connections:   . Frequency of Communication with Friends and Family: Not on file  .  Frequency of Social Gatherings with Friends and Family: Not on file  . Attends Religious Services: Not on file  . Active Member of Clubs or Organizations: Not on file  . Attends Archivist Meetings: Not on file  . Marital Status: Not on file  Intimate Partner Violence:   . Fear of Current or Ex-Partner: Not on file  . Emotionally Abused: Not on file  . Physically Abused: Not on file  . Sexually Abused: Not on file    FAMILY HISTORY: Family History  Problem Relation Age of Onset  . Prostate cancer Father     ALLERGIES:  is allergic to pregabalin.  MEDICATIONS:  Current Outpatient Medications  Medication Sig Dispense Refill  . apixaban (ELIQUIS) 5 MG TABS tablet Take 1 tablet (5 mg total) by mouth 2 (two) times daily. 60 tablet 5  . LORazepam (ATIVAN) 0.5 MG tablet Take 1 tablet (0.5 mg total) by mouth every 4 (four) hours as needed for anxiety. 60 tablet 0  . omeprazole (PRILOSEC) 20 MG capsule Take 20 mg by mouth daily.     [name] oxyCODONE (OXY IR/ROXICODONE) 5 MG immediate release tablet Take 1-2 tablets (5-10 mg total) by mouth every 4 (four) hours as needed for severe pain. 60 tablet 0  . prochlorperazine (COMPAZINE) 10 MG tablet Take 1 tablet (10 mg total) by mouth every 6 (six) hours as needed (Nausea or vomiting). (Patient not taking: Reported on 05/23/2019) 30 tablet 1   No current facility-administered medications for this visit.    REVIEW OF SYSTEMS:   A 10+ POINT REVIEW OF SYSTEMS WAS OBTAINED including neurology, dermatology, psychiatry, cardiac, respiratory, lymph, extremities, GI, GU, Musculoskeletal, constitutional, breasts, reproductive, HEENT.  All pertinent positives are noted in the HPI.  All others are negative.    PHYSICAL EXAMINATION: ECOG FS:1 - Symptomatic but completely ambulatory  Vitals:   06/13/19 1008  BP: 98/62  Pulse: 94  Resp: 18  Temp: 98 F (36.7 C)  SpO2: 99%   Wt Readings from Last 3 Encounters:  06/13/19 135 lb 1.6 oz (61.3 kg)  05/23/19 135 lb 1.6 oz (61.3 kg)  05/02/19 131 lb 9.6 oz (59.7 kg)   Body mass index is 18.84 kg/m.    GENERAL:alert, in no acute distress and comfortable SKIN: no acute rashes, no significant lesions EYES: conjunctiva are pink and non-injected, sclera anicteric OROPHARYNX: MMM, no exudates, no oropharyngeal erythema or ulceration NECK: supple, no JVD LYMPH:  no palpable lymphadenopathy in the cervical, axillary or inguinal regions LUNGS: clear to auscultation b/l with normal respiratory effort HEART: regular rate & rhythm ABDOMEN:  normoactive bowel sounds , non tender, not distended. No palpable hepatosplenomegaly.  Extremity: no pedal edema PSYCH: alert & oriented x 3 with fluent speech NEURO: no focal motor/sensory deficits  LABORATORY DATA:  I have reviewed the data as listed  . CBC Latest Ref Rng & Units 06/13/2019 05/23/2019 05/02/2019  WBC 4.0 - 10.5 K/uL 5.9 4.2 6.9  Hemoglobin 13.0 - 17.0 g/dL 12.3(L) 11.3(L) 11.9(L)    Hematocrit 39.0 - 52.0 % 39.0 35.1(L) 36.4(L)  Platelets 150 - 400 K/uL 344 265 351    . CMP Latest Ref Rng & Units 06/13/2019 05/23/2019 05/02/2019  Glucose 70 - 99 mg/dL 96 93 85  BUN 6 - 20 mg/dL '7 10 10  ' Creatinine 0.61 - 1.24 mg/dL 0.78 0.78 0.72  Sodium 135 - 145 mmol/L 138 138 139  Potassium 3.5 - 5.1 mmol/L 4.1 3.8 4.2  Chloride 98 - 111 mmol/L  104 105 103  CO2 22 - 32 mmol/L '28 24 28  ' Calcium 8.9 - 10.3 mg/dL 9.6 8.8(L) 9.7  Total Protein 6.5 - 8.1 g/dL 7.3 6.8 7.3  Total Bilirubin 0.3 - 1.2 mg/dL 0.2(L) 0.2(L) 0.2(L)  Alkaline Phos 38 - 126 U/L 136(H) 128(H) 137(H)  AST 15 - 41 U/L 13(L) 11(L) 19  ALT 0 - 44 U/L '12 13 21    ' 01/22/2019 [facility]: Tumor Mutational Burden     01/22/2019 PD-L1 Immunohistochemistry Analysis    01/22/2019 Soft Tissue Needle Core Biopsy Surgical Pathology     RADIOGRAPHIC STUDIES: I have personally reviewed the radiological images as listed and agreed with the findings in the report. NM PET Image Restag (PS) Skull Base To Thigh  Result Date: 05/16/2019 CLINICAL DATA:  Subsequent treatment strategy for metastatic lung cancer, status post chemo radiation. EXAM: NUCLEAR MEDICINE PET SKULL BASE TO THIGH TECHNIQUE: 7.2 mCi F-18 FDG was injected intravenously. Full-ring PET imaging was performed from the skull base to thigh after the radiotracer. CT data was obtained and used for attenuation correction and anatomic localization. Fasting blood glucose: 84 mg/dl COMPARISON:  CTA chest dated 01/20/2019. CT abdomen/pelvis dated 01/21/2019. FINDINGS: Mediastinal blood pool activity: SUV max 1.7 Liver activity: SUV max NA NECK: No hypermetabolic cervical lymphadenopathy. Incidental CT findings: none CHEST: Radiation changes in the medial bilateral upper lobes and superior segment right lower lobe. Associated hypermetabolism, max SUV 6.3. Abnormal soft tissue in the right paratracheal region, measuring up to 2.1 cm short axis (series 4/image 67),  previously 4.0 cm. Mild focal hypermetabolism medially, max SUV 4.3. 9 mm short axis right azygoesophageal recess node (series 4/image 80), previously 12 mm, max SUV 5.1. Left chest port terminates the cavoatrial junction. Incidental CT findings: Mild atherosclerotic calcifications of the aortic arch. ABDOMEN/PELVIS: No hypermetabolic abdominopelvic lymphadenopathy. No abnormal hypermetabolism in the liver, spleen, pancreas, or adrenal glands. Incidental CT findings: Calcified splenic granulomata. 2 mm nonobstructing right lower pole renal calculus. Atherosclerotic calcifications abdominal aorta and branch vessels. Thick-walled bladder, although underdistended. SKELETON: Diffuse/heterogeneous hypermetabolism involving the visualized axial and appendicular skeleton, including the lower thoracolumbar spine and pelvis, favoring marrow stimulation. However, there is focal hypermetabolism involving the right lateral mass of C1, max SUV 3.8. While indeterminate, metastasis not excluded. Incidental CT findings: none IMPRESSION: Radiation changes in the right hemithorax, as above. Improving mediastinal nodal metastases. Prior bulky right cervical metastases have resolved. No evidence of metastatic disease in the abdomen/pelvis. Focal hypermetabolism at C1, indeterminate. Attention on follow-up is suggested. Electronically Signed   By: Julian Hy M.D.   On: 05/16/2019 16:50    ASSESSMENT & PLAN:   This is a 54 year old malewith  1. Newly diagnosed Metastatic Poorly differentiated lung adenocarcinoma Presented with Large neck mass, mediastinal mass, renal mass, and questionable bone lesions no brain mets on MRI brain 01/22/2019 PD-L1 Immunohistochemistry Analysis which revealed "Tumor Proportion Score (TPS) 50%" 05/16/2019 PET/CT ([id number]) revealed "Radiation changes in the right hemithorax, as above. Improving mediastinal nodal metastases. Prior bulky right cervical metastases have resolved. 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 Acute DVT- Right IJ, Right Innominate Vein, and likely also the SVC- related to malignancy+ tobacco-  on lovenox  4. Symptomatic hemorrhoids  5. Normocytic anemia -Likely due to underlying malignancy -Will check ferritin, iron studies, vitamin B12 level, and RBC folate in the a.m.  6.Thrombocytosis -- likely due to paraneoplastic effect of tumor and reactive  due to inflammation and tissue inflammation from RT.-- now resolved.  7. Nicotine dependence -He was counseled about tobacco cessation.  PLAN: -Discussed pt labwork today, 06/13/19; blood counts and chemistries are nml  -Discussed 06/13/2019 Magnesium is WNL at 1.9  -Continue triple antibiotic ointment for nasal sores -The pt has no prohibitive toxicities from continuing C5D1 of Atezolizumab, Carboplatin and Taxol at this time -Plan to finish 6 cycles of treatment with Carboplatin/Taxol and Atezolizumab and would then continue Atezolizumab for maintenance till progression/intolerance. -Will see back in 3 weeks with labs   FOLLOW UP: Follow-up for cycle 6 of chemotherapy with port flush labs and MD visit as scheduled on 07/04/2019   The total time spent in the appt was 15 minutes and more than 50% was on counseling and direct patient cares.  All of the patient's questions were answered with apparent satisfaction. The patient knows to call the clinic with any problems, questions or concerns.    Sullivan Lone MD [place] AAHIVMS [facility] Hematology/Oncology Physician [facility]  (Office):       [phone] (Work cell):  [phone] (Fax):           [phone]  06/13/2019 10:59 AM  I, Yevette Edwards, am acting as a scribe for Dr. Sullivan Lone.   .I have reviewed the above documentation for accuracy and completeness, and I agree with the above. Brunetta Genera MD

## 2019-06-13 NOTE — Patient Instructions (Signed)
[place] Discharge Instructions for Patients Receiving Chemotherapy  Today you received the following chemotherapy agents: atezolizumab, carboplatin, and paclitaxel.   To help prevent nausea and vomiting after your treatment, we encourage you to take your nausea medication as directed.   If you develop nausea and vomiting that is not controlled by your nausea medication, call the clinic.   BELOW ARE SYMPTOMS THAT SHOULD BE REPORTED IMMEDIATELY:  *FEVER GREATER THAN 100.5 F  *CHILLS WITH OR WITHOUT FEVER  NAUSEA AND VOMITING THAT IS NOT CONTROLLED WITH YOUR NAUSEA MEDICATION  *UNUSUAL SHORTNESS OF BREATH  *UNUSUAL BRUISING OR BLEEDING  TENDERNESS IN MOUTH AND THROAT WITH OR WITHOUT PRESENCE OF ULCERS  *URINARY PROBLEMS  *BOWEL PROBLEMS  UNUSUAL RASH Items with * indicate a potential emergency and should be followed up as soon as possible.  Feel free to call the clinic should you have any questions or concerns. The clinic phone number is [phone].  Please show the [place] at check-in to the Emergency Department and triage nurse.

## 2019-06-15 ENCOUNTER — Inpatient Hospital Stay: Payer: Medicaid Other

## 2019-06-15 ENCOUNTER — Other Ambulatory Visit: Payer: Self-pay

## 2019-06-15 VITALS — BP 104/68 | HR 88 | Temp 98.2°F | Resp 18

## 2019-06-15 DIAGNOSIS — Z5112 Encounter for antineoplastic immunotherapy: Secondary | ICD-10-CM | POA: Diagnosis not present

## 2019-06-15 DIAGNOSIS — Z7189 Other specified counseling: Secondary | ICD-10-CM

## 2019-06-15 DIAGNOSIS — C3491 Malignant neoplasm of unspecified part of right bronchus or lung: Secondary | ICD-10-CM

## 2019-06-15 MED ORDER — PEGFILGRASTIM-CBQV 6 MG/0.6ML ~~LOC~~ SOSY
PREFILLED_SYRINGE | SUBCUTANEOUS | Status: AC
  Filled 2019-06-15: qty 0.6

## 2019-06-15 MED ORDER — PEGFILGRASTIM-CBQV 6 MG/0.6ML ~~LOC~~ SOSY
6.0000 mg | PREFILLED_SYRINGE | Freq: Once | SUBCUTANEOUS | Status: AC
  Administered 2019-06-15: 08:00:00 6 mg via SUBCUTANEOUS

## 2019-06-15 NOTE — Patient Instructions (Signed)

## 2019-06-20 ENCOUNTER — Other Ambulatory Visit: Payer: Self-pay

## 2019-06-21 ENCOUNTER — Encounter: Payer: Self-pay | Admitting: Family Medicine

## 2019-06-21 ENCOUNTER — Ambulatory Visit (INDEPENDENT_AMBULATORY_CARE_PROVIDER_SITE_OTHER): Payer: Medicaid Other | Admitting: Family Medicine

## 2019-06-21 VITALS — BP 98/64 | HR 96 | Temp 97.9°F | Wt 136.0 lb

## 2019-06-21 DIAGNOSIS — K219 Gastro-esophageal reflux disease without esophagitis: Secondary | ICD-10-CM | POA: Diagnosis not present

## 2019-06-21 DIAGNOSIS — H6122 Impacted cerumen, left ear: Secondary | ICD-10-CM

## 2019-06-21 DIAGNOSIS — Z0000 Encounter for general adult medical examination without abnormal findings: Secondary | ICD-10-CM | POA: Diagnosis not present

## 2019-06-21 DIAGNOSIS — Z1211 Encounter for screening for malignant neoplasm of colon: Secondary | ICD-10-CM

## 2019-06-21 DIAGNOSIS — Z8669 Personal history of other diseases of the nervous system and sense organs: Secondary | ICD-10-CM | POA: Diagnosis not present

## 2019-06-21 NOTE — Progress Notes (Signed)
Subjective:     David Berry. is a 54 y.o. male and is here for a comprehensive physical exam. The patient reports no problems.  Pt states he is doing well. He is almost done with Chemo.  Pt notes needing to find a dentist and an eye dr.  Abbott Berry endorses h/o glaucoma and increased blurred vision.  Social History   Socioeconomic History  . Marital status: Single    Spouse name: Not on file  . Number of children: 1  . Years of education: GED  . Highest education level: Not on file  Occupational History    Comment: fork lift driver  Tobacco Use  . Smoking status: Former Smoker    Types: Cigars  . Smokeless tobacco: Never Used  . Tobacco comment: smokes 3 black and mild cigars per day  Substance and Sexual Activity  . Alcohol use: Not Currently  . Drug use: No  . Sexual activity: Yes  Other Topics Concern  . Not on file  Social History Narrative   Lives alone   Caffeine- coffee, 20 oz daily, tea occas   Social Determinants of Health   Financial Resource Strain:   . Difficulty of Paying Living Expenses: Not on file  Food Insecurity:   . Worried About Charity fundraiser in the Last Year: Not on file  . Ran Out of Food in the Last Year: Not on file  Transportation Needs:   . Lack of Transportation (Medical): Not on file  . Lack of Transportation (Non-Medical): Not on file  Physical Activity:   . Days of Exercise per Week: Not on file  . Minutes of Exercise per Session: Not on file  Stress:   . Feeling of Stress : Not on file  Social Connections:   . Frequency of Communication with Friends and Family: Not on file  . Frequency of Social Gatherings with Friends and Family: Not on file  . Attends Religious Services: Not on file  . Active Member of Clubs or Organizations: Not on file  . Attends Archivist Meetings: Not on file  . Marital Status: Not on file  Intimate Partner Violence:   . Fear of Current or Ex-Partner: Not on file  . Emotionally Abused: Not on  file  . Physically Abused: Not on file  . Sexually Abused: Not on file   Health Maintenance  Topic Date Due  . TETANUS/TDAP  07/14/1985  . COLONOSCOPY  07/14/2016  . INFLUENZA VACCINE  08/08/2019 (Originally 12/09/2018)  . HIV Screening  Completed    The following portions of the patient's history were reviewed and updated as appropriate: allergies, current medications, past family history, past medical history, past social history, past surgical history and problem list.  Review of Systems A comprehensive review of systems was negative.   Objective:    BP 98/64 (BP Location: Right Arm, Patient Position: Sitting, Cuff Size: Normal)   Pulse 96   Temp 97.9 F (36.6 C) (Temporal)   Wt 136 lb (61.7 kg)   SpO2 98%   BMI 18.97 kg/m  General appearance: alert, cooperative and no distress Head: Normocephalic, without obvious abnormality, atraumatic Eyes: conjunctivae/corneas clear. PERRL, EOM's intact. Fundi benign. Ears: normal TM's and external ear canals both ears Nose: Nares normal. Septum midline. Mucosa normal. No drainage or sinus tenderness. Throat: lips, mucosa, and tongue normal; teeth and gums normal Neck: no adenopathy, no carotid bruit, no JVD, supple, symmetrical, trachea midline and thyroid not enlarged, symmetric, no tenderness/mass/nodules Lungs:  clear to auscultation bilaterally Heart: regular rate and rhythm, S1, S2 normal, no murmur, click, rub or gallop Abdomen: soft, non-tender; bowel sounds normal; no masses,  no organomegaly Extremities: extremities normal, atraumatic, no cyanosis or edema Pulses: 2+ and symmetric Skin: Skin color, texture, turgor normal. No rashes or lesions Lymph nodes: Cervical, supraclavicular, and axillary nodes normal. Neurologic: Alert and oriented X 3, normal strength and tone. Normal symmetric reflexes. Normal coordination and gait    Assessment:    Healthy male exam.      Plan:     Anticipatory guidance given including wearing  seatbelts, smoke detectors in the home, increasing physical activity, increasing p.o. intake of water and vegetables. -labs up to date  -given handout See After Visit Summary for Counseling Recommendations    Impacted cerumen of left ear -consent obtained.  L ear irrigated.  Pt tolerated procedure well -given handout  Screen for colon cancer  - Plan: Ambulatory referral to Gastroenterology  History of glaucoma  - Plan: Ambulatory referral to Ophthalmology  Gastroesophageal reflux disease, unspecified whether esophagitis present  - Plan: Ambulatory referral to Gastroenterology  F/u prn  Grier Mitts, MD

## 2019-06-21 NOTE — Patient Instructions (Signed)
Preventive Care 41-53 Years Old, Male Preventive care refers to lifestyle choices and visits with your health care provider that can promote health and wellness. This includes:  A yearly physical exam. This is also called an annual well check.  Regular dental and eye exams.  Immunizations.  Screening for certain conditions.  Healthy lifestyle choices, such as eating a healthy diet, getting regular exercise, not using drugs or products that contain nicotine and tobacco, and limiting alcohol use. What can I expect for my preventive care visit? Physical exam Your health care provider will check:  Height and weight. These may be used to calculate body mass index (BMI), which is a measurement that tells if you are at a healthy weight.  Heart rate and blood pressure.  Your skin for abnormal spots. Counseling Your health care provider may ask you questions about:  Alcohol, tobacco, and drug use.  Emotional well-being.  Home and relationship well-being.  Sexual activity.  Eating habits.  Work and work Statistician. What immunizations do I need?  Influenza (flu) vaccine  This is recommended every year. Tetanus, diphtheria, and pertussis (Tdap) vaccine  You may need a Td booster every 10 years. Varicella (chickenpox) vaccine  You may need this vaccine if you have not already been vaccinated. Zoster (shingles) vaccine  You may need this after age 64. Measles, mumps, and rubella (MMR) vaccine  You may need at least one dose of MMR if you were born in 1957 or later. You may also need a second dose. Pneumococcal conjugate (PCV13) vaccine  You may need this if you have certain conditions and were not previously vaccinated. Pneumococcal polysaccharide (PPSV23) vaccine  You may need one or two doses if you smoke cigarettes or if you have certain conditions. Meningococcal conjugate (MenACWY) vaccine  You may need this if you have certain conditions. Hepatitis A  vaccine  You may need this if you have certain conditions or if you travel or work in places where you may be exposed to hepatitis A. Hepatitis B vaccine  You may need this if you have certain conditions or if you travel or work in places where you may be exposed to hepatitis B. Haemophilus influenzae type b (Hib) vaccine  You may need this if you have certain risk factors. Human papillomavirus (HPV) vaccine  If recommended by your health care provider, you may need three doses over 6 months. You may receive vaccines as individual doses or as more than one vaccine together in one shot (combination vaccines). Talk with your health care provider about the risks and benefits of combination vaccines. What tests do I need? Blood tests  Lipid and cholesterol levels. These may be checked every 5 years, or more frequently if you are over 60 years old.  Hepatitis C test.  Hepatitis B test. Screening  Lung cancer screening. You may have this screening every year starting at age 43 if you have a 30-pack-year history of smoking and currently smoke or have quit within the past 15 years.  Prostate cancer screening. Recommendations will vary depending on your family history and other risks.  Colorectal cancer screening. All adults should have this screening starting at age 72 and continuing until age 2. Your health care provider may recommend screening at age 14 if you are at increased risk. You will have tests every 1-10 years, depending on your results and the type of screening test.  Diabetes screening. This is done by checking your blood sugar (glucose) after you have not eaten  for a while (fasting). You may have this done every 1-3 years.  Sexually transmitted disease (STD) testing. Follow these instructions at home: Eating and drinking  Eat a diet that includes fresh fruits and vegetables, whole grains, lean protein, and low-fat dairy products.  Take vitamin and mineral supplements as  recommended by your health care provider.  Do not drink alcohol if your health care provider tells you not to drink.  If you drink alcohol: ? Limit how much you have to 0-2 drinks a day. ? Be aware of how much alcohol is in your drink. In the [place] one drink equals one 12 oz bottle of beer (355 mL), one 5 oz glass of wine (148 mL), or one 1 oz glass of hard liquor (44 mL). Lifestyle  Take daily care of your teeth and gums.  Stay active. Exercise for at least 30 minutes on 5 or more days each week.  Do not use any products that contain nicotine or tobacco, such as cigarettes, e-cigarettes, and chewing tobacco. If you need help quitting, ask your health care provider.  If you are sexually active, practice safe sex. Use a condom or other form of protection to prevent STIs (sexually transmitted infections).  Talk with your health care provider about taking a low-dose aspirin every day starting at age 16. What's next?  Go to your health care provider once a year for a well check visit.  Ask your health care provider how often you should have your eyes and teeth checked.  Stay up to date on all vaccines. This information is not intended to replace advice given to you by your health care provider. Make sure you discuss any questions you have with your health care provider. Document Revised: 04/20/2018 Document Reviewed: 04/20/2018 Elsevier Patient Education  [address], Adult The ears produce a substance called earwax that helps keep bacteria out of the ear and protects the skin in the ear canal. Occasionally, earwax can build up in the ear and cause discomfort or hearing loss. What increases the risk? This condition is more likely to develop in people who:  Are male.  Are elderly.  Naturally produce more earwax.  Clean their ears often with cotton swabs.  Use earplugs often.  Use in-ear headphones often.  Wear hearing aids.  Have narrow ear  canals.  Have earwax that is overly thick or sticky.  Have eczema.  Are dehydrated.  Have excess hair in the ear canal. What are the signs or symptoms? Symptoms of this condition include:  Reduced or muffled hearing.  A feeling of fullness in the ear or feeling that the ear is plugged.  Fluid coming from the ear.  Ear pain.  Ear itch.  Ringing in the ear.  Coughing.  An obvious piece of earwax that can be seen inside the ear canal. How is this diagnosed? This condition may be diagnosed based on:  Your symptoms.  Your medical history.  An ear exam. During the exam, your health care provider will look into your ear with an instrument called an otoscope. You may have tests, including a hearing test. How is this treated? This condition may be treated by:  Using ear drops to soften the earwax.  Having the earwax removed by a health care provider. The health care provider may: ? Flush the ear with water. ? Use an instrument that has a loop on the end (curette). ? Use a suction device.  Surgery to remove the wax  buildup. This may be done in severe cases. Follow these instructions at home:   Take over-the-counter and prescription medicines only as told by your health care provider.  Do not put any objects, including cotton swabs, into your ear. You can clean the opening of your ear canal with a washcloth or facial tissue.  Follow instructions from your health care provider about cleaning your ears. Do not over-clean your ears.  Drink enough fluid to keep your urine clear or pale yellow. This will help to thin the earwax.  Keep all follow-up visits as told by your health care provider. If earwax builds up in your ears often or if you use hearing aids, consider seeing your health care provider for routine, preventive ear cleanings. Ask your health care provider how often you should schedule your cleanings.  If you have hearing aids, clean them according to  instructions from the manufacturer and your health care provider. Contact a health care provider if:  You have ear pain.  You develop a fever.  You have blood, pus, or other fluid coming from your ear.  You have hearing loss.  You have ringing in your ears that does not go away.  Your symptoms do not improve with treatment.  You feel like the room is spinning (vertigo). Summary  Earwax can build up in the ear and cause discomfort or hearing loss.  The most common symptoms of this condition include reduced or muffled hearing and a feeling of fullness in the ear or feeling that the ear is plugged.  This condition may be diagnosed based on your symptoms, your medical history, and an ear exam.  This condition may be treated by using ear drops to soften the earwax or by having the earwax removed by a health care provider.  Do not put any objects, including cotton swabs, into your ear. You can clean the opening of your ear canal with a washcloth or facial tissue. This information is not intended to replace advice given to you by your health care provider. Make sure you discuss any questions you have with your health care provider. Document Revised: 04/08/2017 Document Reviewed: 07/07/2016 Elsevier Patient Education  2020 [place].  Glaucoma  Glaucoma happens when the fluid pressure in the eyeball is too high. If the pressure stays high for too long, the eye may get damaged. This can cause a loss of vision. The most common type of glaucoma causes pressure in the eye to go up slowly. There may be no symptoms at first. Testing for this condition can help to find the condition before damage happens. Early treatment can often stop vision loss. Follow these instructions at home:  Take over-the-counter and prescription medicines only as told by your doctor. ? If you were given eye drops, use them exactly as told. You will probably need to use this medicine for the rest of your  life.  Exercise often. Talk with your doctor about which types of exercise are safe for you. Do not do exercises where you lean your head on the floor while you lift part of your body off the floor (such as a headstand).  Keep all follow-up visits as told by your doctor. This is important. Contact a doctor if:  Your symptoms get worse.  You have new symptoms. Get help right away if:  You have bad pain in your eye.  You have vision problems.  You have a bad headache in the area around your eye.  You feel sick  to your stomach (nauseous).  You throw up (vomit).  You start to have the same problems with your other eye. Summary  Glaucoma happens when the fluid pressure in the eyeball is too high. If this is not treated, it can cause a loss of vision.  There may be no symptoms at first. Testing can help to find the condition before damage happens.  Early treatment can often stop vision loss. This information is not intended to replace advice given to you by your health care provider. Make sure you discuss any questions you have with your health care provider. Document Revised: 04/08/2017 Document Reviewed: 12/23/2016 Elsevier Patient Education  [place].

## 2019-06-25 ENCOUNTER — Encounter: Payer: Self-pay | Admitting: Family Medicine

## 2019-06-27 ENCOUNTER — Encounter: Payer: Self-pay | Admitting: Nurse Practitioner

## 2019-06-27 ENCOUNTER — Ambulatory Visit (INDEPENDENT_AMBULATORY_CARE_PROVIDER_SITE_OTHER): Payer: Medicaid Other | Admitting: Nurse Practitioner

## 2019-06-27 ENCOUNTER — Telehealth: Payer: Self-pay | Admitting: *Deleted

## 2019-06-27 ENCOUNTER — Other Ambulatory Visit: Payer: Self-pay

## 2019-06-27 VITALS — BP 90/62 | HR 120 | Temp 97.4°F | Ht 71.0 in | Wt 136.0 lb

## 2019-06-27 DIAGNOSIS — Z7901 Long term (current) use of anticoagulants: Secondary | ICD-10-CM

## 2019-06-27 DIAGNOSIS — Z01818 Encounter for other preprocedural examination: Secondary | ICD-10-CM | POA: Diagnosis not present

## 2019-06-27 DIAGNOSIS — K219 Gastro-esophageal reflux disease without esophagitis: Secondary | ICD-10-CM | POA: Diagnosis not present

## 2019-06-27 DIAGNOSIS — K625 Hemorrhage of anus and rectum: Secondary | ICD-10-CM

## 2019-06-27 DIAGNOSIS — Z1211 Encounter for screening for malignant neoplasm of colon: Secondary | ICD-10-CM

## 2019-06-27 MED ORDER — NA SULFATE-K SULFATE-MG SULF 17.5-3.13-1.6 GM/177ML PO SOLN: 1.0000 | Freq: Once | ORAL | 0 refills | Status: AC

## 2019-06-27 NOTE — Patient Instructions (Signed)
If you are age 53 or older, your body mass index should be between 23-30. Your Body mass index is 18.97 kg/m. If this is out of the aforementioned range listed, please consider follow up with your Primary Care Provider.  If you are age 71 or younger, your body mass index should be between 19-25. Your Body mass index is 18.97 kg/m. If this is out of the aformentioned range listed, please consider follow up with your Primary Care Provider.   You have been scheduled for an endoscopy and colonoscopy. Please follow the written instructions given to you at your visit today. Please pick up your prep supplies at the pharmacy within the next 1-3 days. If you use inhalers (even only as needed), please bring them with you on the day of your procedure.

## 2019-06-27 NOTE — Progress Notes (Signed)
Reviewed with NP Chester Holstein and I agree with her management plans.  Kally Cadden L. Tarri Glenn, MD, MPH

## 2019-06-27 NOTE — Addendum Note (Signed)
Addended by: Horris Latino on: 06/27/2019 09:46 AM   Modules accepted: Orders

## 2019-06-27 NOTE — Progress Notes (Signed)
Referring Provider: Grier Mitts, NP  Reason for Referral :              ASSESSMENT / PLAN:    54 year old male with pmh significant for but not limited to metastatic poorly differentiated adenocarcinoma of lung, chronic bronchitis, DVT (on Eliquis).  No evidence of metastatic disease in the abdomen and pelvis on PET scan in January. Has almost completed chemotherapy.   # Colon cancer screening/[facility] of colon cancer in father in his fifties .  -occasional self-limited painless rectal bleeding which patient attributes to hemorrhoids.  -Patient looks and feels well.  No evidence for metastatic disease in the abdomen or pelvis is on recent PET.  We will proceed with colonoscopy. The risks and benefits of colonoscopy with possible polypectomy / biopsies were discussed and the patient agrees to proceed.  # Longstanding GERD (heartburn). -Currently asymptomatic but at times even water causes heartburn.  Has used PPIs on and off since the 1990's -For evaluation of longstanding GERD patient will be scheduled for EGD to be done at time of colonoscopy. The risks and benefits of EGD were discussed and the patient agrees to proceed.   #  Chronic anticoagulation.  On Eliquis for history of DVT Hold Eliquis for 2 days before procedure - will instruct when and how to resume after procedure. Patient understands that there is a low but real risk of cardiovascular event such as heart attack, stroke, or embolism /  thrombosis while off blood thinner. The patient consents to proceed. Will communicate by phone or EMR with patient's prescribing provider to confirm that holding Eliquis is reasonable in this case.    HPI:     Chief Complaint:   Screening colonoscopy and GERD  David Berry. is a 54 y.o. male with a pmh significant for but not limited to lung cancer, chronic bronchitis, DVT (on Eliquis)  Mr. Quadros is a 54 year old male referred by PCP for colon cancer screening.  In September 2020  patient was diagnosed with metastatic poorly differentiated adenonocarcinoma.  PET scan in January showed improving mediastinal nodal metastasis.  No evidence of metastatic disease in the abdomen or pelvis.  He will get last chemotherapy treatment next week.  Patient tells me his father was diagnosed with colon cancer in his fifties, survived it.  Patient occasionally sees blood in his stool, attributes to hemorrhoids.  He has occasional constipation, better over the last month.  Mr. Siegman has a longstanding history of GERD diagnosed in the 1990  He has intermittently used PPIs.  Tums even water gives him heartburn.  No recent symptoms.  He describes some sort of an esophageal surgery done as a baby   Data Reviewed:  06/13/19 Alk phos 136, liver test otherwise unremarkable Hemoglobin 12.3, MCV 93, platelets 344   05/16/19 PET IMPRESSION: Radiation changes in the right hemithorax, as above.  Improving mediastinal nodal metastases. Prior bulky right cervical metastases have resolved.  No evidence of metastatic disease in the abdomen/pelvis.  Focal hypermetabolism at C1, indeterminate. Attention on follow-up is suggested   Past Medical History:  Diagnosis Date  . Blood in stool   . Cancer ([place])   . 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 GERD (gastroesophageal reflux disease)      Past Surgical History:  Procedure Laterality Date  . APPENDECTOMY     teenager  . INGUINAL HERNIA REPAIR Right 2016, 2017   [facility], 2018 [facility] mesh  . IR IMAGING GUIDED PORT  INSERTION  04/26/2019   Family History  Problem Relation Age of Onset  . Prostate cancer Father    Social History   Tobacco Use  . Smoking status: Former Smoker    Types: Cigars  . Smokeless tobacco: Never Used  . Tobacco comment: smokes 3 black and mild cigars per day  Substance Use Topics  . Alcohol use: Not Currently  . Drug use: No   Current Outpatient Medications  Medication Sig  Dispense Refill  . apixaban (ELIQUIS) 5 MG TABS tablet Take 1 tablet (5 mg total) by mouth 2 (two) times daily. 60 tablet 5  . LORazepam (ATIVAN) 0.5 MG tablet Take 1 tablet (0.5 mg total) by mouth every 4 (four) hours as needed for anxiety. 60 tablet 0  . oxyCODONE (OXY IR/ROXICODONE) 5 MG immediate release tablet Take 1-2 tablets (5-10 mg total) by mouth every 4 (four) hours as needed for severe pain. 60 tablet 0  . omeprazole (PRILOSEC) 20 MG capsule Take 20 mg by mouth daily.     No current facility-administered medications for this visit.   Allergies  Allergen Reactions  . Pregabalin Other (See Comments)    Homicidal idealizations, pain in lower extremities       Review of Systems: All systems reviewed and negative except where noted in HPI.   Serum creatinine: 0.78 mg/dL 06/13/19 0926 Estimated creatinine clearance: 94.3 mL/min   Physical Exam:    Wt Readings from Last 3 Encounters:  06/27/19 136 lb (61.7 kg)  06/21/19 136 lb (61.7 kg)  06/13/19 135 lb 1.6 oz (61.3 kg)    BP 90/62   Pulse (!) 120   Temp (!) 97.4 F (36.3 C)   Ht '5\' 11"'$  (1.803 m)   Wt 136 lb (61.7 kg)   BMI 18.97 kg/m  Constitutional:  Pleasant thin ale in no acute distress. Psychiatric: Normal mood and affect. Behavior is normal. EENT: Pupils normal.  Conjunctivae are normal. No scleral icterus. Neck supple.  Cardiovascular: Normal rate, regular rhythm. No edema Pulmonary/chest: Effort normal and breath sounds normal. No wheezing, rales or rhonchi. Abdominal: Soft, nondistended, nontender. Bowel sounds active throughout. There are no masses palpable. No hepatomegaly. Neurological: Alert and oriented to person place and time. Skin: Skin is warm and dry. No rashes noted.  Tye Savoy, NP  06/27/2019, 8:41 AM  Cc:  Referring Provider [name], MD

## 2019-06-27 NOTE — Telephone Encounter (Signed)
[facility] Pre-operative Risk Assessment     Request for surgical clearance:     Endoscopy Procedure  What type of surgery is being performed?     Colonoscopy/EGD  When is this surgery scheduled?     Monday 07/09/19  What type of clearance is required ?   Pharmacy  Are there any medications that need to be held prior to surgery and how long? Eliquis 2 days  Practice name and name of physician performing surgery?      [facility]  What is your office phone and fax number?      Phone- [phone]  [phone]  Anesthesia type (None, local, MAC, general) ?       MAC

## 2019-07-04 ENCOUNTER — Other Ambulatory Visit: Payer: Self-pay

## 2019-07-04 ENCOUNTER — Inpatient Hospital Stay: Payer: Medicaid Other

## 2019-07-04 ENCOUNTER — Inpatient Hospital Stay (HOSPITAL_BASED_OUTPATIENT_CLINIC_OR_DEPARTMENT_OTHER): Payer: Medicaid Other | Admitting: Hematology

## 2019-07-04 VITALS — BP 100/75 | HR 98 | Temp 98.2°F | Resp 17 | Ht 71.0 in | Wt 136.1 lb

## 2019-07-04 DIAGNOSIS — C3491 Malignant neoplasm of unspecified part of right bronchus or lung: Secondary | ICD-10-CM

## 2019-07-04 DIAGNOSIS — I82621 Acute embolism and thrombosis of deep veins of right upper extremity: Secondary | ICD-10-CM | POA: Diagnosis not present

## 2019-07-04 DIAGNOSIS — Z7189 Other specified counseling: Secondary | ICD-10-CM

## 2019-07-04 DIAGNOSIS — Z5112 Encounter for antineoplastic immunotherapy: Secondary | ICD-10-CM | POA: Diagnosis not present

## 2019-07-04 DIAGNOSIS — Z95828 Presence of other vascular implants and grafts: Secondary | ICD-10-CM

## 2019-07-04 DIAGNOSIS — G893 Neoplasm related pain (acute) (chronic): Secondary | ICD-10-CM

## 2019-07-04 DIAGNOSIS — Z5111 Encounter for antineoplastic chemotherapy: Secondary | ICD-10-CM

## 2019-07-04 LAB — CBC WITH DIFFERENTIAL/PLATELET
Abs Immature Granulocytes: 0.01 10*3/uL (ref 0.00–0.07)
Basophils Absolute: 0 10*3/uL (ref 0.0–0.1)
Basophils Relative: 1 %
Eosinophils Absolute: 0 10*3/uL (ref 0.0–0.5)
Eosinophils Relative: 0 %
HCT: 37.2 % — ABNORMAL LOW (ref 39.0–52.0)
Hemoglobin: 11.9 g/dL — ABNORMAL LOW (ref 13.0–17.0)
Immature Granulocytes: 0 %
Lymphocytes Relative: 18 %
Lymphs Abs: 0.7 10*3/uL (ref 0.7–4.0)
MCH: 29.9 pg (ref 26.0–34.0)
MCHC: 32 g/dL (ref 30.0–36.0)
MCV: 93.5 fL (ref 80.0–100.0)
Monocytes Absolute: 0.8 10*3/uL (ref 0.1–1.0)
Monocytes Relative: 22 %
Neutro Abs: 2.1 10*3/uL (ref 1.7–7.7)
Neutrophils Relative %: 59 %
Platelets: 289 10*3/uL (ref 150–400)
RBC: 3.98 MIL/uL — ABNORMAL LOW (ref 4.22–5.81)
RDW: 16 % — ABNORMAL HIGH (ref 11.5–15.5)
WBC: 3.7 10*3/uL — ABNORMAL LOW (ref 4.0–10.5)
nRBC: 0 % (ref 0.0–0.2)

## 2019-07-04 LAB — CMP (CANCER CENTER ONLY)
ALT: 12 U/L (ref 0–44)
AST: 12 U/L — ABNORMAL LOW (ref 15–41)
Albumin: 3.7 g/dL (ref 3.5–5.0)
Alkaline Phosphatase: 141 U/L — ABNORMAL HIGH (ref 38–126)
Anion gap: 10 (ref 5–15)
BUN: 9 mg/dL (ref 6–20)
CO2: 26 mmol/L (ref 22–32)
Calcium: 9.4 mg/dL (ref 8.9–10.3)
Chloride: 105 mmol/L (ref 98–111)
Creatinine: 0.78 mg/dL (ref 0.61–1.24)
GFR, Est AFR Am: >60 mL/min (ref >60)
GFR, Estimated: >60 mL/min (ref >60)
Glucose, Bld: 95 mg/dL (ref 70–99)
Potassium: 4 mmol/L (ref 3.5–5.1)
Sodium: 141 mmol/L (ref 135–145)
Total Bilirubin: 0.4 mg/dL (ref 0.3–1.2)
Total Protein: 7.2 g/dL (ref 6.5–8.1)

## 2019-07-04 LAB — TSH: TSH: 0.884 u[IU]/mL (ref 0.320–4.118)

## 2019-07-04 LAB — MAGNESIUM: Magnesium: 1.9 mg/dL (ref 1.7–2.4)

## 2019-07-04 MED ORDER — HEPARIN SOD (PORK) LOCK FLUSH 100 UNIT/ML IV SOLN
500.0000 [IU] | Freq: Once | INTRAVENOUS | Status: AC | PRN
  Administered 2019-07-04: 16:00:00 500 [IU]
  Filled 2019-07-04: qty 5

## 2019-07-04 MED ORDER — DIPHENHYDRAMINE HCL 50 MG/ML IJ SOLN
50.0000 mg | Freq: Once | INTRAMUSCULAR | Status: AC
  Administered 2019-07-04: 10:00:00 50 mg via INTRAVENOUS

## 2019-07-04 MED ORDER — DIPHENHYDRAMINE HCL 50 MG/ML IJ SOLN
INTRAMUSCULAR | Status: AC
  Filled 2019-07-04: qty 1

## 2019-07-04 MED ORDER — OXYCODONE HCL 5 MG PO TABS: 5.0000 mg | ORAL_TABLET | ORAL | 0 refills | Status: DC | PRN

## 2019-07-04 MED ORDER — FAMOTIDINE IN NACL 20-0.9 MG/50ML-% IV SOLN
20.0000 mg | Freq: Once | INTRAVENOUS | Status: AC
  Administered 2019-07-04: 20 mg via INTRAVENOUS

## 2019-07-04 MED ORDER — FAMOTIDINE IN NACL 20-0.9 MG/50ML-% IV SOLN
INTRAVENOUS | Status: AC
  Filled 2019-07-04: qty 50

## 2019-07-04 MED ORDER — SODIUM CHLORIDE 0.9 % IV SOLN
135.0000 mg/m2 | Freq: Once | INTRAVENOUS | Status: AC
  Administered 2019-07-04: 228 mg via INTRAVENOUS
  Filled 2019-07-04: qty 38

## 2019-07-04 MED ORDER — SODIUM CHLORIDE 0.9 % IV SOLN
550.0000 mg | Freq: Once | INTRAVENOUS | Status: AC
  Administered 2019-07-04: 15:00:00 550 mg via INTRAVENOUS
  Filled 2019-07-04: qty 55

## 2019-07-04 MED ORDER — SODIUM CHLORIDE 0.9% FLUSH
10.0000 mL | INTRAVENOUS | Status: DC | PRN
  Administered 2019-07-04: 10 mL
  Filled 2019-07-04: qty 10

## 2019-07-04 MED ORDER — SODIUM CHLORIDE 0.9 % IV SOLN
1200.0000 mg | Freq: Once | INTRAVENOUS | Status: AC
  Administered 2019-07-04: 1200 mg via INTRAVENOUS
  Filled 2019-07-04: qty 20

## 2019-07-04 MED ORDER — SODIUM CHLORIDE 0.9 % IV SOLN
Freq: Once | INTRAVENOUS | Status: AC
  Filled 2019-07-04: qty 250

## 2019-07-04 MED ORDER — PALONOSETRON HCL INJECTION 0.25 MG/5ML
0.2500 mg | Freq: Once | INTRAVENOUS | Status: AC
  Administered 2019-07-04: 10:00:00 0.25 mg via INTRAVENOUS

## 2019-07-04 MED ORDER — PALONOSETRON HCL INJECTION 0.25 MG/5ML
INTRAVENOUS | Status: AC
  Filled 2019-07-04: qty 5

## 2019-07-04 MED ORDER — SODIUM CHLORIDE 0.9 % IV SOLN
Freq: Once | INTRAVENOUS | Status: AC
  Filled 2019-07-04: qty 5

## 2019-07-04 NOTE — Progress Notes (Signed)
HEMATOLOGY/ONCOLOGY CLINIC NOTE  Date of Service: 07/04/2019  Patient Care Team: [name], MD as PCP - General (Family Medicine)  CHIEF COMPLAINTS/PURPOSE OF CONSULTATION:  contnued management of Metastatic Poorly differentiated lung adenocarcinoma  HISTORY OF PRESENTING ILLNESS:   [name] is a 54 year old male with no significant medical history.  He presented to the hospital with progressive right arm and chest pain with new right-sided neck swelling.  The patient states that he was diagnosed with carpal tunnel syndrome received an injection in August.  However, he continued to have progressive pain up and down his right arm that would also radiate to his right chest.  He took ibuprofen with no improvement.  1 week prior to admission, he developed new right sided neck swelling.  He presented to the emergency room for further evaluation.  Work-up in the emergency room was significant for a CT angiogram of the neck and chest which revealed a bulky soft tissue mass in the right neck continuing into the upper chest most compatible with extensive metastatic lymphadenopathy.  This mass measures up to 34 mm and encases multiple vessels including the right upper lobe pulmonary vessels, superior vena cava, and brachiocephalic vessel.  He also had a large poorly defined malignant appearing infiltrative mass measuring 6.2 x 5.6 x 7.9 cm within the right anterior and middle mediastinum with suspected invasion of the distal left brachiocephalic vein and probable tumor occlusion of the right subclavian and jugular veins.  There is also suspected tumor invasion of the upper SVC with string-like narrowing of the SVC but with patency of the SVC just above the right atrium.  The tumor abuts the aorta and great vessels and also results in significant narrowing of the right upper lobe pulmonary arterial vessels.  He also had a CT of the abdomen pelvis which showed a 15 mm heterogeneously enhancing lesion  in the upper pole of the right kidney concerning for renal cell carcinoma, no lymphadenopathy in the abdomen or pelvis, focal hyperenhancement of the anterior left liver most likely related to aberrant venous anatomy/flow, sclerotic lesions in the right acetabulum and left iliac bone are indeterminate.  These are likely bone islands but attention on follow-up is recommended a metastatic disease cannot be excluded.  MRI of the brain with and without contrast did not show any evidence of intracranial metastatic disease. The patient underwent a CT-guided biopsy in interventional radiology on 01/22/2019.  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   When seen today, patient reports ongoing pain and swelling to his right arm and right side of his neck.  Reports ongoing pain to his right chest.  He reports intermittent facial swelling which is worse in the morning and improves throughout the day.  He reports anorexia and a weight loss of 20 pounds over the past few weeks. He has had dizziness for the past few months.  Denies headaches.  He also noted that on the day of admission his peripheral vision was poor.  His vision is now improved.  He denies food getting stuck or difficulty swallowing but does feel as though his throat is "tight."  He reports a nonproductive cough.  Denies fevers and chills.  He is not really having any shortness of breath.  Denies abdominal pain, nausea, vomiting, constipation, diarrhea.  He has not noticed any epistaxis, hemoptysis, hematemesis, hematuria, melena, hematochezia.  The patient is single.  He has 1 daughter who lives  in [place], [place].  Reports occasional alcohol use and currently smokes 3 cigars a day since 1997.  He is currently living in a rooming house with other roommates who all have their own room.  They share a common bathroom and kitchen.  Medical oncology was asked see the  patient to make recommendations regarding his newly diagnosed poorly differentiated carcinoma.  History reviewed. No pertinent past medical history.   Current Treatment:  Atezolizumab + carbo/taxol  INTERVAL HISTORY:   David Berry. is a  54 y.o. male who is here for evaluation and management of metastatic poorly differentiated lung adenocarcinoma. He is here for C6 of Atezolizumab, Carboplatin and Taxol. The patient's last visit with [place] was on 06/13/2019. The pt reports that he is doing well overall.  The pt reports that he feels okay but he is tired for more than a week after treatment. Pt has been able cook for himself and still has a good appetite. He has been able to walk regularly and is drinking enough liquids. Pt has continued to have tingling/numbness in his fingers and toes that has not worsened. He experiences bone pain and body aches that last about a week after his Neulasta injection.   Lab results today (07/04/19) of CBC w/diff and CMP is as follows: all values are WNL except for WBC at 3.7K, RBC at 3.98, Hgb at 11.9, HCT at 37.2, RDW at 16.0, AST at 12, ALP at 141. 07/04/2019 Magnesium at 1.9 07/04/2019 TSH at 0.884  On review of systems, pt reports fatigue, healthy appetite, bone pain, body aches and denies worsening neuropathy, mouth sores, discomfort at port site, constipation, abdominal pain and any other symptoms.   MEDICAL HISTORY:  Past Medical History:  Diagnosis Date  . Blood in stool   . Cancer ([place])   . Chronic bronchitis with emphysema ([place])   . GERD (gastroesophageal reflux disease)     SURGICAL HISTORY: Past Surgical History:  Procedure Laterality Date  . APPENDECTOMY     teenager  . INGUINAL HERNIA REPAIR Right 2016, 2017   [facility], 2018 [facility] mesh  . IR IMAGING GUIDED PORT INSERTION  04/26/2019    SOCIAL HISTORY: Social History   Socioeconomic History  . Marital status: Single    Spouse name: Not on file  . Number  of children: 1  . Years of education: GED  . Highest education level: Not on file  Occupational History    Comment: fork lift driver  Tobacco Use  . Smoking status: Former Smoker    Types: Cigars  . Smokeless tobacco: Never Used  . Tobacco comment: smokes 3 black and mild cigars per day  Substance and Sexual Activity  . Alcohol use: Not Currently  . Drug use: No  . Sexual activity: Yes  Other Topics Concern  . Not on file  Social History Narrative   Lives alone   Caffeine- coffee, 20 oz daily, tea occas   Social Determinants of Health   Financial Resource Strain:   . Difficulty of Paying Living Expenses: Not on file  Food Insecurity:   . Worried About Charity fundraiser in the Last Year: Not on file  . Ran Out of Food in the Last Year: Not on file  Transportation Needs:   . Lack of Transportation (Medical): Not on file  . Lack of Transportation (Non-Medical): Not on file  Physical Activity:   . Days of Exercise per Week: Not on file  . Minutes of  Exercise per Session: Not on file  Stress:   . Feeling of Stress : Not on file  Social Connections:   . Frequency of Communication with Friends and Family: Not on file  . Frequency of Social Gatherings with Friends and Family: Not on file  . Attends Religious Services: Not on file  . Active Member of Clubs or Organizations: Not on file  . Attends Archivist Meetings: Not on file  . Marital Status: Not on file  Intimate Partner Violence:   . Fear of Current or Ex-Partner: Not on file  . Emotionally Abused: Not on file  . Physically Abused: Not on file  . Sexually Abused: Not on file    FAMILY HISTORY: Family History  Problem Relation Age of Onset  . Prostate cancer Father     ALLERGIES:  is allergic to pregabalin.  MEDICATIONS:  Current Outpatient Medications  Medication Sig Dispense Refill  . apixaban (ELIQUIS) 5 MG TABS tablet Take 1 tablet (5 mg total) by mouth 2 (two) times daily. 60 tablet 5  .  LORazepam (ATIVAN) 0.5 MG tablet Take 1 tablet (0.5 mg total) by mouth every 4 (four) hours as needed for anxiety. 60 tablet 0  . omeprazole (PRILOSEC) 20 MG capsule Take 20 mg by mouth daily.    [name] oxyCODONE (OXY IR/ROXICODONE) 5 MG immediate release tablet Take 1-2 tablets (5-10 mg total) by mouth every 4 (four) hours as needed for severe pain. 60 tablet 0   No current facility-administered medications for this visit.   Facility-Administered Medications Ordered in Other Visits  Medication Dose Route Frequency Provider Last Rate Last Admin  . atezolizumab (TECENTRIQ) 1,200 mg in sodium chloride 0.9 % 250 mL chemo infusion  1,200 mg Intravenous Once Brunetta Genera, MD      . CARBOplatin (PARAPLATIN) 550 mg in sodium chloride 0.9 % 250 mL chemo infusion  550 mg Intravenous Once Brunetta Genera, MD      . famotidine (PEPCID) IVPB 20 mg premix  20 mg Intravenous Once Brunetta Genera, MD 200 mL/hr at 07/04/19 1026 20 mg at 07/04/19 1026  . fosaprepitant (EMEND) 150 mg, dexamethasone (DECADRON) 12 mg in sodium chloride 0.9 % 145 mL IVPB   Intravenous Once Brunetta Genera, MD      . heparin lock flush 100 unit/mL  500 Units Intracatheter Once PRN Brunetta Genera, MD      . PACLitaxel (TAXOL) 228 mg in sodium chloride 0.9 % 250 mL chemo infusion (> '80mg'$ /m2)  135 mg/m2 (Treatment Plan Recorded) Intravenous Once Brunetta Genera, MD      . sodium chloride flush (NS) 0.9 % injection 10 mL  10 mL Intracatheter PRN Brunetta Genera, MD        REVIEW OF SYSTEMS:   A 10+ POINT REVIEW OF SYSTEMS WAS OBTAINED including neurology, dermatology, psychiatry, cardiac, respiratory, lymph, extremities, GI, GU, Musculoskeletal, constitutional, breasts, reproductive, HEENT.  All pertinent positives are noted in the HPI.  All others are negative.    PHYSICAL EXAMINATION: ECOG FS:1 - Symptomatic but completely ambulatory  Vitals:   07/04/19 0911  BP: 100/75  Pulse: 98  Resp: 17    Temp: 98.2 F (36.8 C)  SpO2: 98%   Wt Readings from Last 3 Encounters:  07/04/19 136 lb 1.6 oz (61.7 kg)  06/27/19 136 lb (61.7 kg)  06/21/19 136 lb (61.7 kg)   Body mass index is 18.98 kg/m.    GENERAL:alert, in no acute distress and comfortable  SKIN: no acute rashes, no significant lesions EYES: conjunctiva are pink and non-injected, sclera anicteric OROPHARYNX: MMM, no exudates, no oropharyngeal erythema or ulceration NECK: supple, no JVD LYMPH:  no palpable lymphadenopathy in the cervical, axillary or inguinal regions LUNGS: clear to auscultation b/l with normal respiratory effort HEART: regular rate & rhythm ABDOMEN:  normoactive bowel sounds , non tender, not distended. No palpable hepatosplenomegaly.  Extremity: no pedal edema PSYCH: alert & oriented x 3 with fluent speech NEURO: no focal motor/sensory deficits  LABORATORY DATA:  I have reviewed the data as listed  . CBC Latest Ref Rng & Units 07/04/2019 06/13/2019 05/23/2019  WBC 4.0 - 10.5 K/uL 3.7(L) 5.9 4.2  Hemoglobin 13.0 - 17.0 g/dL 11.9(L) 12.3(L) 11.3(L)  Hematocrit 39.0 - 52.0 % 37.2(L) 39.0 35.1(L)  Platelets 150 - 400 K/uL 289 344 265    . CMP Latest Ref Rng & Units 07/04/2019 06/13/2019 05/23/2019  Glucose 70 - 99 mg/dL 95 96 93  BUN 6 - 20 mg/dL '9 7 10  '$ Creatinine 0.61 - 1.24 mg/dL 0.78 0.78 0.78  Sodium 135 - 145 mmol/L 141 138 138  Potassium 3.5 - 5.1 mmol/L 4.0 4.1 3.8  Chloride 98 - 111 mmol/L 105 104 105  CO2 22 - 32 mmol/L '26 28 24  '$ Calcium 8.9 - 10.3 mg/dL 9.4 9.6 8.8(L)  Total Protein 6.5 - 8.1 g/dL 7.2 7.3 6.8  Total Bilirubin 0.3 - 1.2 mg/dL 0.4 0.2(L) 0.2(L)  Alkaline Phos 38 - 126 U/L 141(H) 136(H) 128(H)  AST 15 - 41 U/L 12(L) 13(L) 11(L)  ALT 0 - 44 U/L '12 12 13    '$ 01/22/2019 [facility]: Tumor Mutational Burden     01/22/2019 PD-L1 Immunohistochemistry Analysis    01/22/2019 Soft Tissue Needle Core Biopsy Surgical Pathology     RADIOGRAPHIC STUDIES: I have personally  reviewed the radiological images as listed and agreed with the findings in the report. No results found.  ASSESSMENT & PLAN:   This is a 54 year old malewith  1. Newly diagnosed Metastatic Poorly differentiated lung adenocarcinoma Presented with Large neck mass, mediastinal mass, renal mass, and questionable bone lesions no brain mets on MRI brain 01/22/2019 PD-L1 Immunohistochemistry Analysis which revealed "Tumor Proportion Score (TPS) 50%" 05/16/2019 PET/CT ([id number]) revealed "Radiation changes in the right hemithorax, as above. Improving mediastinal nodal metastases. Prior bulky right cervical metastases have resolved. 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 Acute DVT- Right IJ, Right Innominate Vein, and likely also the SVC- related to malignancy+ tobacco-  on lovenox  4. Symptomatic hemorrhoids  5. Normocytic anemia -Likely due to underlying malignancy -Will check ferritin, iron studies, vitamin B12 level, and RBC folate in the a.m.  6.Thrombocytosis -- likely due to paraneoplastic effect of tumor and reactive due to inflammation and tissue inflammation from RT.-- now resolved.  7. Nicotine dependence -He was counseled about tobacco cessation.  PLAN: -Discussed pt labwork today, 07/04/19; blood counts and chemistries are holding well  -Discussed 07/04/2019 Magnesium is WNL -Discussed 07/04/2019 TSH is WNL -The pt has no prohibitive toxicities from continuing C6D1 of Atezolizumab, Carboplatin and Taxol at this time -This is the last cycle of Atezolizumab, Carboplatin and Taxol. Will continue Atezolizumab until progression or intolerance. -Will discontinue Neulasta after C6 -Will get repeat CT C/A/P in 2-3 months, unless new symptoms arise -Refill Oxycodone  -Will see back in 3 weeks with labs   FOLLOW UP: RTC in 3 weeks with portflush,  labs, MD and treatment (switching from  carbo/taxol/atezolizumab to only atezolizumab maintenance from next cycle)   All of the patient's questions were answered with apparent satisfaction. The patient knows to call the clinic with any problems, questions or concerns.   Sullivan Lone MD [place] AAHIVMS [facility] Hematology/Oncology Physician [facility]  (Office):       [phone] (Work cell):  [phone] (Fax):           [phone]  07/04/2019 10:39 AM  I, Yevette Edwards, am acting as a scribe for Dr. Sullivan Lone.   .I have reviewed the above documentation for accuracy and completeness, and I agree with the above. Brunetta Genera MD

## 2019-07-04 NOTE — Patient Instructions (Signed)
COVID-19 Vaccine Information can be found at: [email or web address] For questions related to vaccine distribution or appointments, please email [email or web address] or call [phone].   [place] Discharge Instructions for Patients Receiving Immunotherapy and Chemotherapy  Today you received the following immunotherapy and chemotherapy agents: Atezolizumab (Tencentriq), Paclitaxel (Taxol), and Carboplatin (Paraplatin)  To help prevent nausea and vomiting after your treatment, we encourage you to take your nausea medication as directed by your provider.   If you develop nausea and vomiting that is not controlled by your nausea medication, call the clinic.   BELOW ARE SYMPTOMS THAT SHOULD BE REPORTED IMMEDIATELY:  *FEVER GREATER THAN 100.5 F  *CHILLS WITH OR WITHOUT FEVER  NAUSEA AND VOMITING THAT IS NOT CONTROLLED WITH YOUR NAUSEA MEDICATION  *UNUSUAL SHORTNESS OF BREATH  *UNUSUAL BRUISING OR BLEEDING  TENDERNESS IN MOUTH AND THROAT WITH OR WITHOUT PRESENCE OF ULCERS  *URINARY PROBLEMS  *BOWEL PROBLEMS  UNUSUAL RASH Items with * indicate a potential emergency and should be followed up as soon as possible.  Feel free to call the clinic should you have any questions or concerns. The clinic phone number is [phone].  Please show the [place] at check-in to the Emergency Department and triage nurse.  Coronavirus (COVID-19) Are you at risk?  Are you at risk for the Coronavirus (COVID-19)?  To be considered HIGH RISK for Coronavirus (COVID-19), you have to meet the following criteria:  . Traveled to [place], [place], [place], [place] or [place]; or in the [place] to [place], [place], [place], or [place]; and have fever, cough, and shortness of breath within the last 2 weeks of travel OR . Been in close contact with a person diagnosed with COVID-19 within the last 2 weeks and have  fever, cough, and shortness of breath . IF YOU DO NOT MEET THESE CRITERIA, YOU ARE CONSIDERED LOW RISK FOR COVID-19.  What to do if you are HIGH RISK for COVID-19?  [name] If you are having a medical emergency, call 911. . Seek medical care right away. Before you go to a doctor's office, urgent care or emergency department, call ahead and tell them about your recent travel, contact with someone diagnosed with COVID-19, and your symptoms. You should receive instructions from your physician's office regarding next steps of care.  . When you arrive at healthcare provider, tell the healthcare staff immediately you have returned from visiting [place], [place], [place], [place] or [place]; or traveled in the [place] to [place], [place], [place], or [place]; in the last two weeks or you have been in close contact with a person diagnosed with COVID-19 in the last 2 weeks.   . Tell the health care staff about your symptoms: fever, cough and shortness of breath. . After you have been seen by a medical provider, you will be either: o Tested for (COVID-19) and discharged home on quarantine except to seek medical care if symptoms worsen, and asked to  - Stay home and avoid contact with others until you get your results (4-5 days)  - Avoid travel on public transportation if possible (such as bus, train, or airplane) or o Sent to the Emergency Department by EMS for evaluation, COVID-19 testing, and possible admission depending on your condition and test results.  What to do if you are LOW RISK for COVID-19?  Reduce your risk of any infection by using the same precautions used for avoiding the common cold or flu:  [name] Wash your  hands often with soap and warm water for at least 20 seconds.  If soap and water are not readily available, use an alcohol-based hand sanitizer with at least 60% alcohol.  . If coughing or sneezing, cover your mouth and nose by coughing or sneezing into the elbow areas of your shirt  or coat, into a tissue or into your sleeve (not your hands). . Avoid shaking hands with others and consider head nods or verbal greetings only. . Avoid touching your eyes, nose, or mouth with unwashed hands.  . Avoid close contact with people who are sick. . Avoid places or events with large numbers of people in one location, like concerts or sporting events. . Carefully consider travel plans you have or are making. . If you are planning any travel outside or inside the [place], visit the CDC's Travelers' Health webpage for the latest health notices. . If you have some symptoms but not all symptoms, continue to monitor at home and seek medical attention if your symptoms worsen. . If you are having a medical emergency, call 911.   [place] / e-Visit: [email or web address]         [facility]: [facility]: [phone]                   [facility]: [phone]

## 2019-07-05 ENCOUNTER — Ambulatory Visit (INDEPENDENT_AMBULATORY_CARE_PROVIDER_SITE_OTHER): Payer: Medicaid Other

## 2019-07-05 ENCOUNTER — Telehealth: Payer: Self-pay | Admitting: *Deleted

## 2019-07-05 ENCOUNTER — Other Ambulatory Visit: Payer: Self-pay | Admitting: Gastroenterology

## 2019-07-05 DIAGNOSIS — Z1159 Encounter for screening for other viral diseases: Secondary | ICD-10-CM | POA: Diagnosis not present

## 2019-07-05 NOTE — Telephone Encounter (Signed)
Left message for Dr. Irene Limbo nurse to please call me back today.

## 2019-07-05 NOTE — Telephone Encounter (Signed)
Okay to hold Eliquis for 48 hours for essential procedure EGD/COlonoscopy and restart ASAP once adequate hemostasis achieved. David Berry

## 2019-07-05 NOTE — Telephone Encounter (Signed)
Patient informed to hold Eliquis for 2 days. Patient voiced understanding.

## 2019-07-05 NOTE — Telephone Encounter (Signed)
Georgina Pillion, RN with Dr. Irene Limbo office called with a verbal okay for patient to hold Eliquis for 2 days prior to procedure.

## 2019-07-05 NOTE — Telephone Encounter (Signed)
Left a message for Dr. Irene Limbo nurse or someone at the practice to please call be back today.

## 2019-07-05 NOTE — Telephone Encounter (Signed)
Verbal order Dr. Irene Limbo: [facility] for patient to hold Eliquis for 48 hours pre endoscopy/colonoscopy scheduled 07/09/19 w/[facility] Contacted Heather V. At [facility] [phone] and gave verbal order

## 2019-07-06 ENCOUNTER — Inpatient Hospital Stay: Payer: Medicaid Other

## 2019-07-06 ENCOUNTER — Other Ambulatory Visit: Payer: Self-pay

## 2019-07-06 VITALS — BP 92/62 | HR 88 | Temp 98.0°F | Resp 18

## 2019-07-06 DIAGNOSIS — Z5112 Encounter for antineoplastic immunotherapy: Secondary | ICD-10-CM | POA: Diagnosis not present

## 2019-07-06 DIAGNOSIS — C3491 Malignant neoplasm of unspecified part of right bronchus or lung: Secondary | ICD-10-CM

## 2019-07-06 DIAGNOSIS — Z7189 Other specified counseling: Secondary | ICD-10-CM

## 2019-07-06 LAB — SARS CORONAVIRUS 2 (TAT 6-24 HRS): SARS Coronavirus 2: NEGATIVE

## 2019-07-06 MED ORDER — PEGFILGRASTIM-CBQV 6 MG/0.6ML ~~LOC~~ SOSY
6.0000 mg | PREFILLED_SYRINGE | Freq: Once | SUBCUTANEOUS | Status: AC
  Administered 2019-07-06: 08:00:00 6 mg via SUBCUTANEOUS

## 2019-07-06 MED ORDER — PEGFILGRASTIM-CBQV 6 MG/0.6ML ~~LOC~~ SOSY
PREFILLED_SYRINGE | SUBCUTANEOUS | Status: AC
  Filled 2019-07-06: qty 0.6

## 2019-07-09 ENCOUNTER — Ambulatory Visit (AMBULATORY_SURGERY_CENTER): Payer: Medicaid Other | Admitting: Gastroenterology

## 2019-07-09 ENCOUNTER — Other Ambulatory Visit: Payer: Self-pay

## 2019-07-09 ENCOUNTER — Telehealth: Payer: Self-pay

## 2019-07-09 ENCOUNTER — Telehealth: Payer: Self-pay | Admitting: Hematology

## 2019-07-09 ENCOUNTER — Encounter: Payer: Self-pay | Admitting: Gastroenterology

## 2019-07-09 VITALS — BP 91/57 | HR 95 | Temp 96.8°F | Resp 16 | Ht 71.0 in | Wt 136.0 lb

## 2019-07-09 DIAGNOSIS — K2100 Gastro-esophageal reflux disease with esophagitis, without bleeding: Secondary | ICD-10-CM

## 2019-07-09 DIAGNOSIS — K222 Esophageal obstruction: Secondary | ICD-10-CM | POA: Diagnosis not present

## 2019-07-09 DIAGNOSIS — Z1211 Encounter for screening for malignant neoplasm of colon: Secondary | ICD-10-CM

## 2019-07-09 DIAGNOSIS — K219 Gastro-esophageal reflux disease without esophagitis: Secondary | ICD-10-CM | POA: Diagnosis not present

## 2019-07-09 MED ORDER — OMEPRAZOLE 40 MG PO CPDR: 40.0000 mg | DELAYED_RELEASE_CAPSULE | Freq: Two times a day (BID) | ORAL | 3 refills | Status: DC

## 2019-07-09 MED ORDER — SODIUM CHLORIDE 0.9 % IV SOLN: 500.0000 mL | Freq: Once | INTRAVENOUS | Status: DC

## 2019-07-09 NOTE — Patient Instructions (Addendum)
Impression/Recommendations:  Esophagitis handout given to patient.  Resume previous diet. Resume Eliquis (apixaban) at prior dose tomorrow.  Increase Omeprazole to 40 mg. 2 times daily.    Plan barium esophagram.  Follow-up in the office with me or Tye Savoy after the esophagram.  Repeat colonoscopy in 5 years for screening purposes.  YOU HAD AN ENDOSCOPIC PROCEDURE TODAY AT [facility]:   Refer to the procedure report that was given to you for any specific questions about what was found during the examination.  If the procedure report does not answer your questions, please call your gastroenterologist to clarify.  If you requested that your care partner not be given the details of your procedure findings, then the procedure report has been included in a sealed envelope for you to review at your convenience later.  YOU SHOULD EXPECT: Some feelings of bloating in the abdomen. Passage of more gas than usual.  Walking can help get rid of the air that was put into your GI tract during the procedure and reduce the bloating. If you had a lower endoscopy (such as a colonoscopy or flexible sigmoidoscopy) you may notice spotting of blood in your stool or on the toilet paper. If you underwent a bowel prep for your procedure, you may not have a normal bowel movement for a few days.  Please Note:  You might notice some irritation and congestion in your nose or some drainage.  This is from the oxygen used during your procedure.  There is no need for concern and it should clear up in a day or so.  SYMPTOMS TO REPORT IMMEDIATELY:   Following lower endoscopy (colonoscopy or flexible sigmoidoscopy):  Excessive amounts of blood in the stool  Significant tenderness or worsening of abdominal pains  Swelling of the abdomen that is new, acute  Fever of 100F or higher   Following upper endoscopy (EGD)  Vomiting of blood or coffee ground material  New chest pain or pain under the  shoulder blades  Painful or persistently difficult swallowing  New shortness of breath  Fever of 100F or higher  Black, tarry-looking stools  For urgent or emergent issues, a gastroenterologist can be reached at any hour by calling [phone].   DIET:  We do recommend a small meal at first, but then you may proceed to your regular diet.  Drink plenty of fluids but you should avoid alcoholic beverages for 24 hours.  ACTIVITY:  You should plan to take it easy for the rest of today and you should NOT DRIVE or use heavy machinery until tomorrow (because of the sedation medicines used during the test).    FOLLOW UP: Our staff will call the number listed on your records 48-72 hours following your procedure to check on you and address any questions or concerns that you may have regarding the information given to you following your procedure. If we do not reach you, we will leave a message.  We will attempt to reach you two times.  During this call, we will ask if you have developed any symptoms of COVID 19. If you develop any symptoms (ie: fever, flu-like symptoms, shortness of breath, cough etc.) before then, please call [phone].  If you test positive for Covid 19 in the 2 weeks post procedure, please call and report this information to [place].    If any biopsies were taken you will be contacted by phone or by letter within the next 1-3 weeks.  Please call us at [phone] if you have not heard about the biopsies in 3 weeks.    SIGNATURES/CONFIDENTIALITY: You and/or your care partner have signed paperwork which will be entered into your electronic medical record.  These signatures attest to the fact that that the information above on your After Visit Summary has been reviewed and is understood.  Full responsibility of the confidentiality of this discharge information lies with you and/or your care-partner.

## 2019-07-09 NOTE — Telephone Encounter (Signed)
Called and offered pt an earlier appt for endo/colonoscopy today due to a cancellation per Dr. Tarri Glenn.  Pt was agreeable to move appt time to 2:30 pm.  Pt was instructed to arrive at 1:30 pm.

## 2019-07-09 NOTE — Op Note (Signed)
[place] Patient Name: David Berry Procedure Date: 07/09/2019 2:40 PM MRN: [id number] Endoscopist: Thornton Park MD, MD Age: 54 Referring MD:  Date of Birth: 1966/02/26 Gender: Male Account #: [id number] Procedure:                Colonoscopy Indications:              Screening in patient at increased risk: Family                            history of 1st-degree relative with colorectal                            cancer before age 51 years                           Father with colon cancer in his 62s Medicines:                Monitored Anesthesia Care Procedure:                Pre-Anesthesia Assessment:                           - Prior to the procedure, a History and Physical                            was performed, and patient medications and                            allergies were reviewed. The patient's tolerance of                            previous anesthesia was also reviewed. The risks                            and benefits of the procedure and the sedation                            options and risks were discussed with the patient.                            All questions were answered, and informed consent                            was obtained. Prior Anticoagulants: The patient has                            taken Eliquis (apixaban), last dose was 3 days                            prior to procedure. ASA Grade Assessment: III - A                            patient with severe systemic disease. After  reviewing the risks and benefits, the patient was                            deemed in satisfactory condition to undergo the                            procedure.                           After obtaining informed consent, the colonoscope                            was passed under direct vision. Throughout the                            procedure, the patient's blood pressure, pulse, and                            oxygen  saturations were monitored continuously. The                            Colonoscope was introduced through the anus and                            advanced to the 3 cm into the ileum. A second                            forward view of the right colon was performed. The                            colonoscopy was performed without difficulty. The                            patient tolerated the procedure well. The quality                            of the bowel preparation was good. The terminal                            ileum, ileocecal valve, appendiceal orifice, and                            rectum were photographed. Scope In: 2:54:50 PM Scope Out: 3:11:00 PM Scope Withdrawal Time: 0 hours 12 minutes 37 seconds  Total Procedure Duration: 0 hours 16 minutes 10 seconds  Findings:                 The perianal and digital rectal examinations were                            normal.                           Non-bleeding internal hemorrhoids were found. The  hemorrhoids were medium-sized.                           The exam was otherwise without abnormality on                            direct and retroflexion views. No polyps or mass                            identified. Complications:            No immediate complications. Estimated Blood Loss:     Estimated blood loss: none. Impression:               - Non-bleeding internal hemorrhoids.                           - The examination was otherwise normal on direct                            and retroflexion views.                           - No specimens collected. Recommendation:           - Patient has a contact number available for                            emergencies. The signs and symptoms of potential                            delayed complications were discussed with the                            patient. Return to normal activities tomorrow.                            Written discharge instructions  were provided to the                            patient.                           - Resume previous diet.                           - Resume Eliquis (apixaban) at prior dose tomorrow.                           - Repeat colonoscopy in 5 years for screening                            purposes given the family history of colon cancer. Thornton Park MD, MD 07/09/2019 3:34:07 PM This report has been signed electronically.

## 2019-07-09 NOTE — Progress Notes (Signed)
pt tolerated well. VSS. awake and to recovery. Report given to RN.  

## 2019-07-09 NOTE — Progress Notes (Signed)
Pt's states no medical or surgical changes since previsit or office visit.  Temp- June Vitals- Donna 

## 2019-07-09 NOTE — Op Note (Signed)
[place] Patient Name: David Berry Procedure Date: 07/09/2019 2:41 PM MRN: [id number] Endoscopist: Thornton Park MD, MD Age: 53 Referring MD:  Date of Birth: April 30, 1966 Gender: Male Account #: [id number] Procedure:                Upper GI endoscopy Indications:              Suspected gastro-esophageal reflux disease Medicines:                Monitored Anesthesia Care Procedure:                Pre-Anesthesia Assessment:                           - Prior to the procedure, a History and Physical                            was performed, and patient medications and                            allergies were reviewed. The patient's tolerance of                            previous anesthesia was also reviewed. The risks                            and benefits of the procedure and the sedation                            options and risks were discussed with the patient.                            All questions were answered, and informed consent                            was obtained. Prior Anticoagulants: The patient has                            taken Eliquis (apixaban), last dose was 3 days                            prior to procedure. ASA Grade Assessment: III - A                            patient with severe systemic disease. After                            reviewing the risks and benefits, the patient was                            deemed in satisfactory condition to undergo the                            procedure.  After obtaining informed consent, the endoscope was                            passed under direct vision. Throughout the                            procedure, the patient's blood pressure, pulse, and                            oxygen saturations were monitored continuously. The                            Endoscope was introduced through the mouth, with                            the intention of advancing to the esophagus. The                             scope was advanced to the lower third of the                            esophagus before the procedure was aborted.                            Medications were given. The upper GI endoscopy was                            technically difficult and complex due to narrowing.                            The patient tolerated the procedure well. Scope In: Scope Out: Findings:                 LA Grade D (one or more mucosal breaks involving at                            least 75% of esophageal circumference) esophagitis                            with no bleeding was found.                           One benign-appearing, intrinsic severe (stenosis;                            an endoscope cannot pass) stenosis was found 40 cm                            from the incisors. The stenosis was not traversed                            due to resistance. A small rent developed. Complications:            No immediate complications. Estimated Blood Loss:  Estimated blood loss: none. Impression:               - LA Grade D esophagitis with no bleeding.                           - Benign-appearing esophageal stenosis.                           - No specimens collected. Recommendation:           - Patient has a contact number available for                            emergencies. The signs and symptoms of potential                            delayed complications were discussed with the                            patient. Return to normal activities tomorrow.                            Written discharge instructions were provided to the                            patient.                           - Resume previous diet.                           - Resume Eliquis (apixaban) at prior dose tomorrow.                           - Increase omeprazole to 40 mg BID. Consider elixir                            formulation if insurance will cover.                           - I was not aware  of any dysphagia prior to the                            procedure. Dilatation was not performed today as we                            had not discussed the risks - that are increased in                            the setting of Eliquis and radiation therapy. Will                            plan barium esophagram and readdress risks/benefits  of possible esophageal dilation after that review.                           - Follow-up in the office with me or T[facility] Savoy                            after the esophagram. Thornton Park MD, MD 07/09/2019 3:26:14 PM This report has been signed electronically.

## 2019-07-09 NOTE — Telephone Encounter (Signed)
Scheduled per 02/24 los, patient has been called and voicemail was left.

## 2019-07-10 ENCOUNTER — Telehealth: Payer: Self-pay | Admitting: *Deleted

## 2019-07-10 ENCOUNTER — Other Ambulatory Visit: Payer: Self-pay | Admitting: *Deleted

## 2019-07-10 DIAGNOSIS — K219 Gastro-esophageal reflux disease without esophagitis: Secondary | ICD-10-CM

## 2019-07-10 NOTE — Telephone Encounter (Signed)
Spoke with patient who has been scheduled DG esophagus on 3/11 at [facility], 1st floor radiology at 9:30 am (9:15 am arrival). NPO after midnight. Patient also aware of OV scheduled with [facility] as requested by Dr. Tarri Glenn with [facility] on 3/16 at 10:00 am.

## 2019-07-11 ENCOUNTER — Telehealth: Payer: Self-pay

## 2019-07-11 NOTE — Telephone Encounter (Signed)
  Follow up Call-  Call back number 07/09/2019  Post procedure Call Back phone  # [phone]  Permission to leave phone message Yes  Some recent data might be hidden     Patient questions:  Do you have a fever, pain , or abdominal swelling? No. Pain Score  0 *  Have you tolerated food without any problems? Yes.    Have you been able to return to your normal activities? Yes.    Do you have any questions about your discharge instructions: Diet   No. Medications  No. Follow up visit  No.  Do you have questions or concerns about your Care? No.  Actions: * If pain score is 4 or above: 1. No action needed, pain <4.Have you developed a fever since your procedure? no  2.   Have you had an respiratory symptoms (SOB or cough) since your procedure? no  3.   Have you tested positive for COVID 19 since your procedure no  4.   Have you had any family members/close contacts diagnosed with the COVID 19 since your procedure?  no   If yes to any of these questions please route to Joylene John, RN and Alphonsa Gin, Therapist, sports.

## 2019-07-19 ENCOUNTER — Ambulatory Visit (HOSPITAL_COMMUNITY)
Admission: RE | Admit: 2019-07-19 | Discharge: 2019-07-19 | Disposition: A | Discharge status: Home / Self Care | Payer: Medicaid Other | Source: Ambulatory Visit | Attending: Gastroenterology | Admitting: Gastroenterology

## 2019-07-19 ENCOUNTER — Other Ambulatory Visit: Payer: Self-pay

## 2019-07-19 DIAGNOSIS — K219 Gastro-esophageal reflux disease without esophagitis: Secondary | ICD-10-CM | POA: Diagnosis not present

## 2019-07-19 DIAGNOSIS — K449 Diaphragmatic hernia without obstruction or gangrene: Secondary | ICD-10-CM | POA: Diagnosis not present

## 2019-07-19 IMAGING — RF DG ESOPHAGUS
7 of 8 series · 14 of 16 positions shown · non-contrast
Comparison: None.

CLINICAL DATA: Gastroesophageal reflux disease. Esophageal
stricture. History of esophageal surgery as an infant presumably for
esophageal atresia. Current history of lung cancer.

EXAM:
ESOPHOGRAM / BARIUM SWALLOW / BARIUM TABLET STUDY
TECHNIQUE: Combined double contrast and single contrast examination performed
using effervescent crystals, thick barium liquid, and thin barium
liquid. The patient was observed with fluoroscopy swallowing a 13 mm
barium sulphate tablet.
FLUOROSCOPY TIME:  Fluoroscopy Time:  1 minute 42 second
Radiation Exposure Index (if provided by the fluoroscopic device):
Number of Acquired Spot Images: 0

[Series 1: fluoro_barium 2fps_bw · 0.18mm/px · 3 of 20 frames shown (1 of 3)]
[frame 4/20]
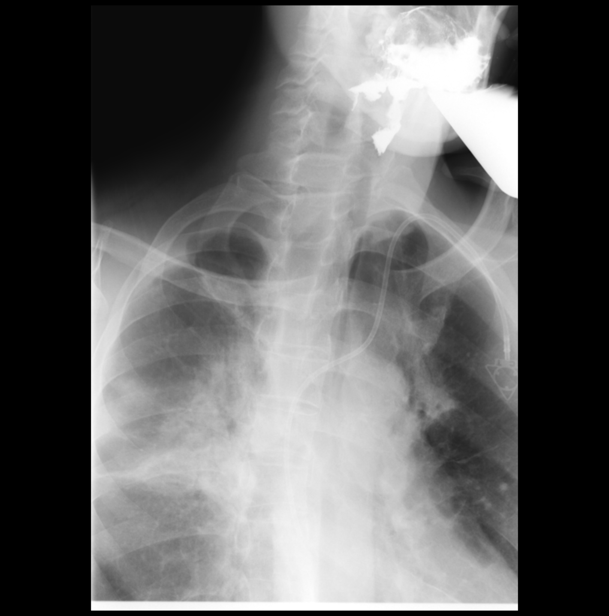
[frame 11/20]
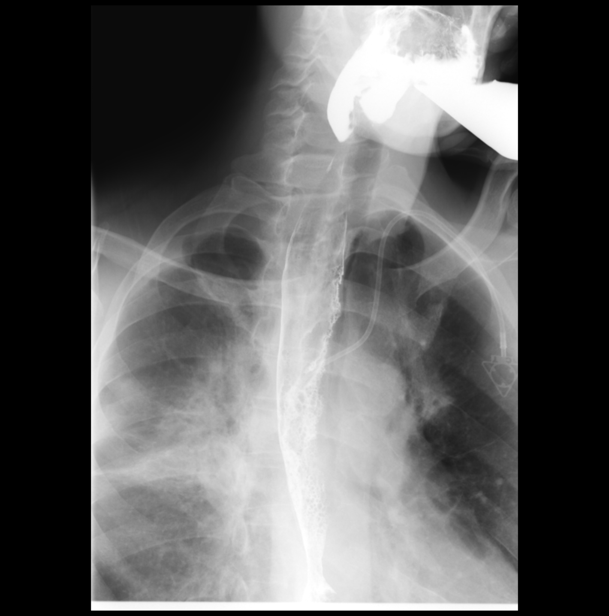
[frame 18/20]
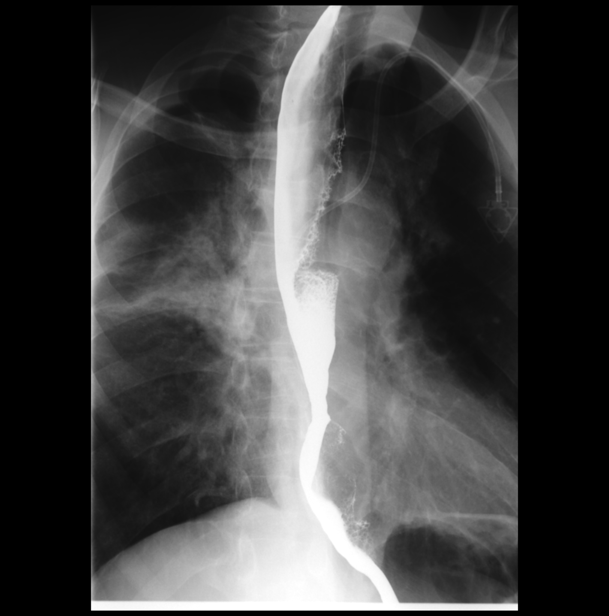

[Series 2: fluoro_barium 2fps_bw · 0.18mm/px · 3 of 17 frames shown (2 of 3)]
[frame 3/17]
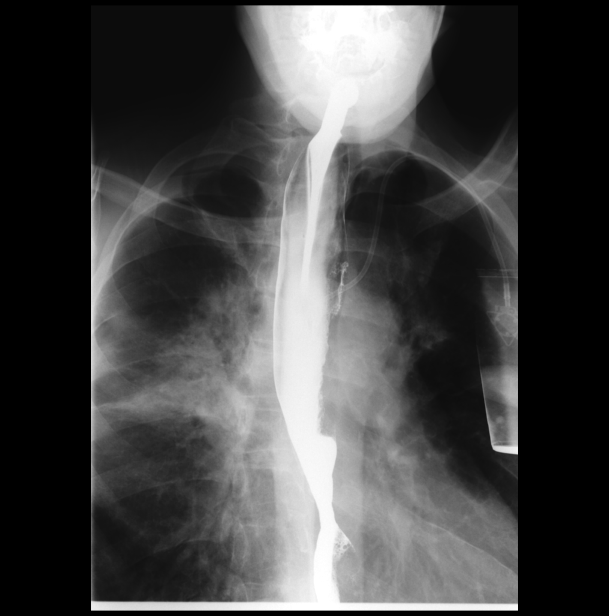
[frame 9/17]
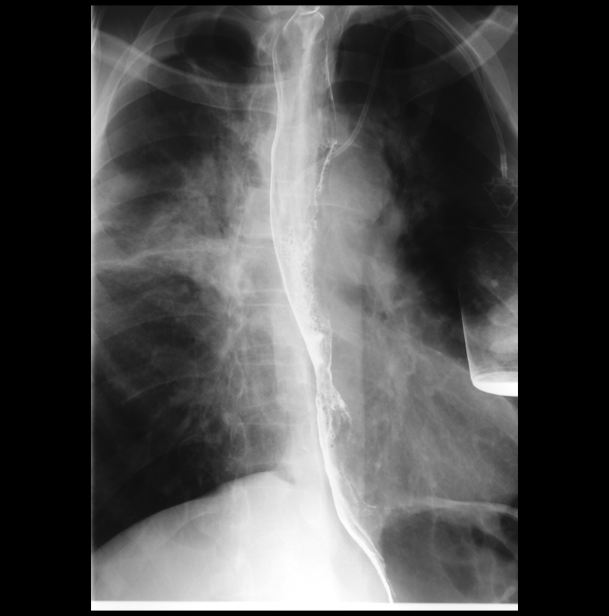
[frame 15/17]
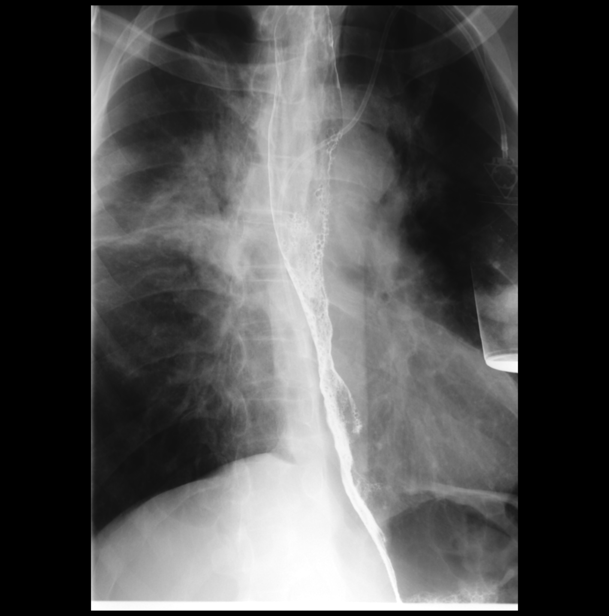

[Series 3: fluoro_barium 2fps_bw · 0.19mm/px · 4 of 20 frames shown (3 of 3)]
[frame 4/20]
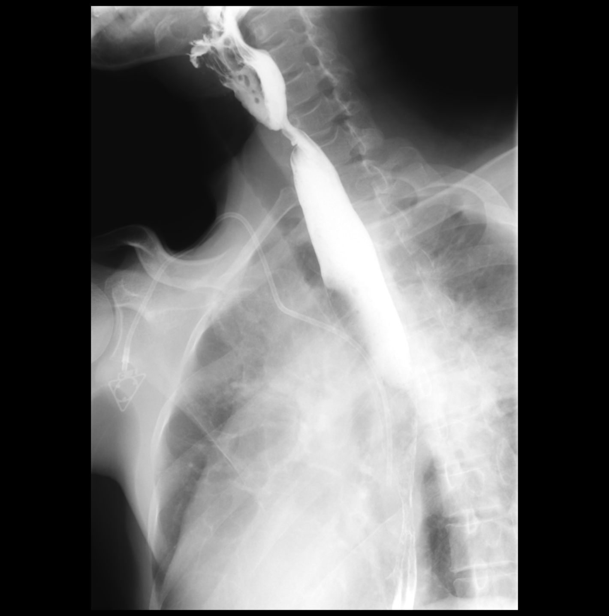
[frame 11/20]
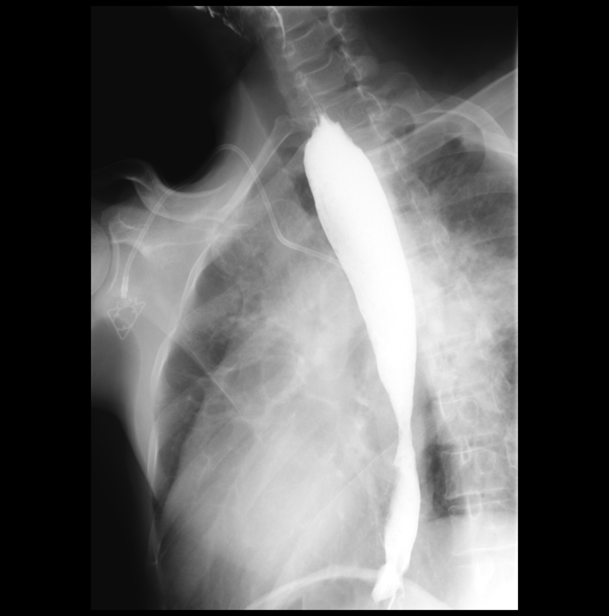
[frame 15/20]
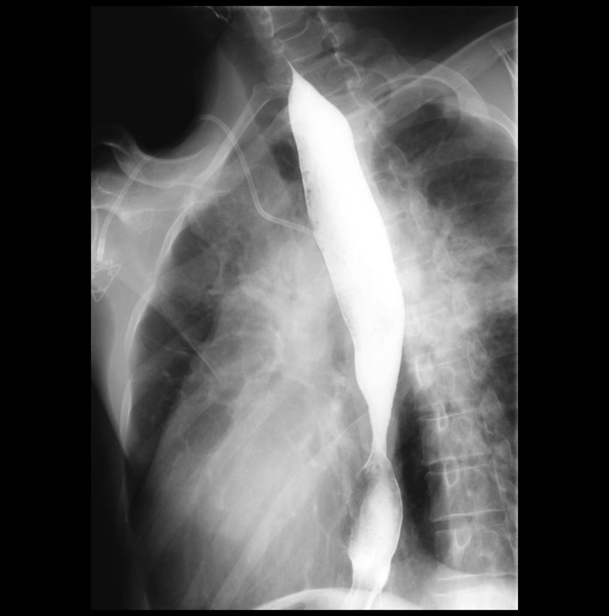
[frame 18/20]
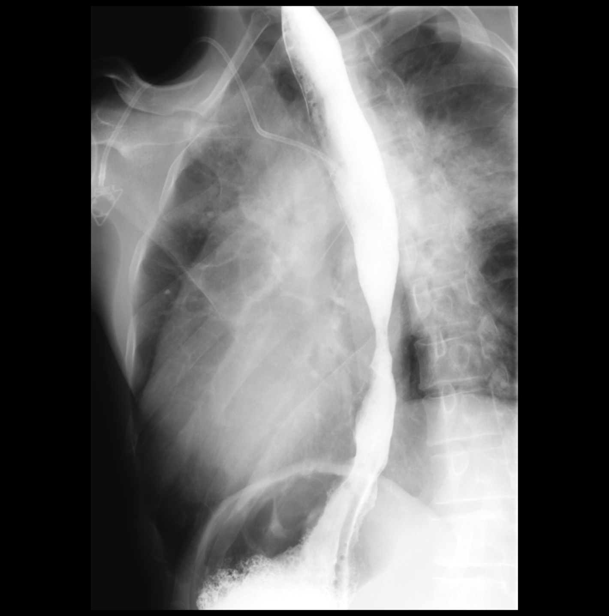

[Series 5: cp_standard · 0.29mm/px · 1 of 1 slices shown (1 of 4)]
[im 1/1]
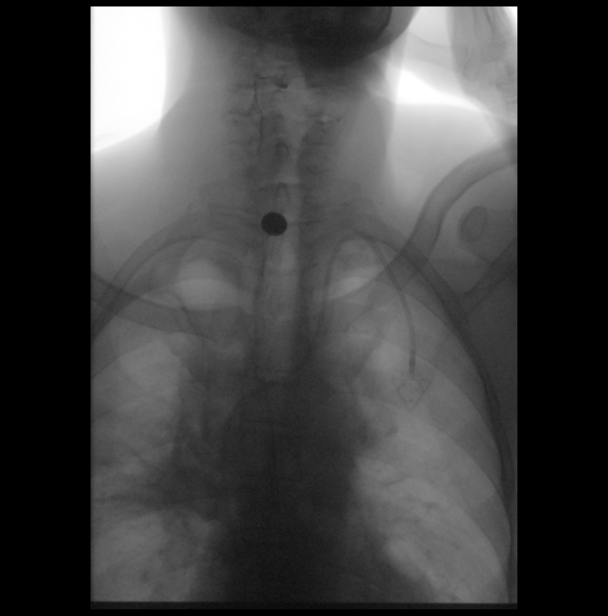

[Series 6: cp_standard · 0.29mm/px · 1 of 1 slices shown (2 of 4)]
[im 1/1]
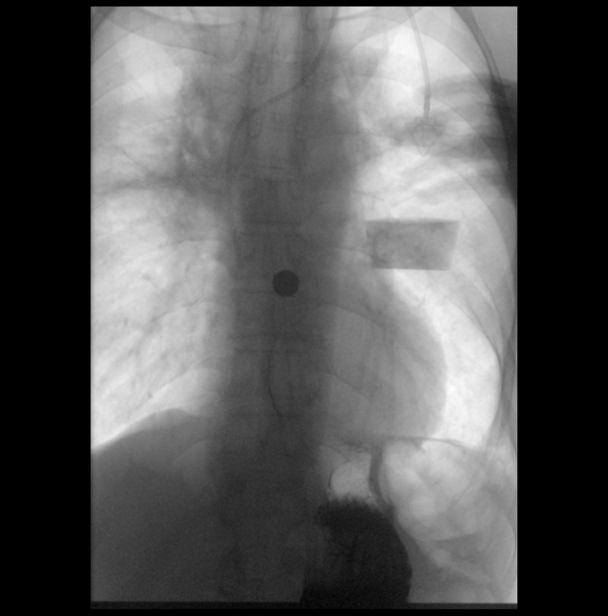

[Series 7: cp_standard · 0.29mm/px · 1 of 1 slices shown (3 of 4)]
[im 1/1]
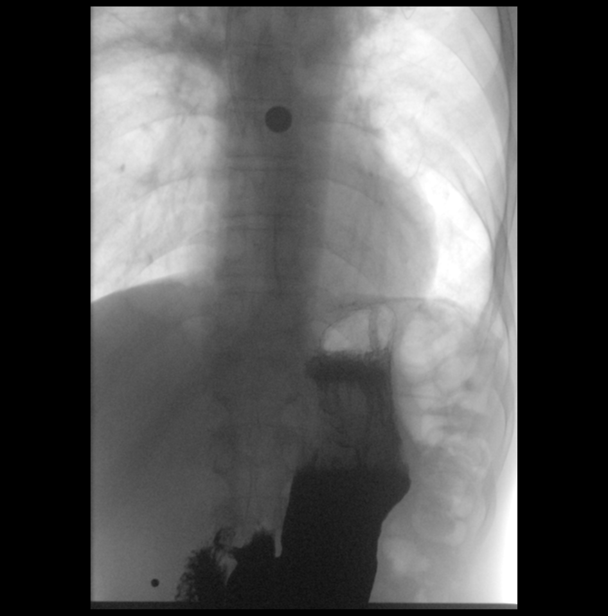

[Series 8: cp_standard · 0.28mm/px · 1 of 1 slices shown (4 of 4)]
[im 1/1]
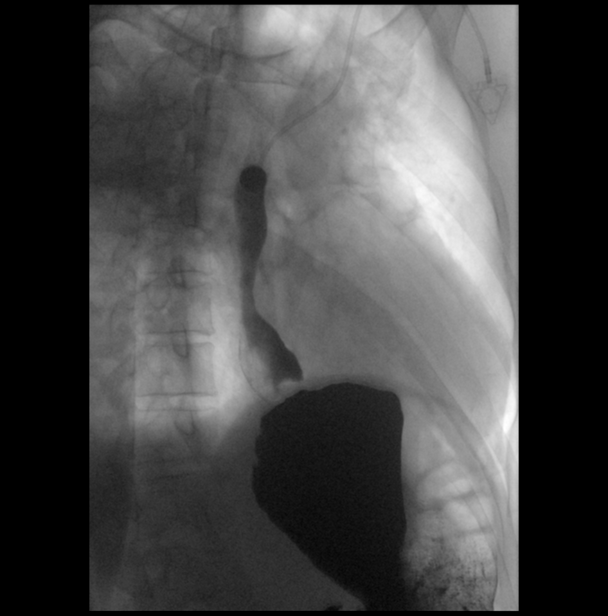

[14 of 16 positions shown; findings below may reference images not displayed]

FINDINGS: Smooth mild stricture of the esophagus at the C6-7 level. This may
be related to prior esophageal stricture and surgery. Barium tablet
was slow to pass through this area but did pass after approximately
1 minute.

Dilated esophagus. Irregularity of the esophageal mucosa
particularly on the left side most likely due to esophagitis.
Radiation therapy could also be a factor.

Moderately severe smooth stricture distal esophagus just above the
hiatal hernia. Barium tablet did not pass this area after
approximately 5 minutes.

Moderate hiatal hernia.  Mild-to-moderate gastroesophageal reflux.

Right upper lobe airspace density consistent with prior treated
cancer. Port-A-Cath in place.
IMPRESSION: Mild stricture at the C6-7 level likely related to prior esophageal
surgery. Barium tablet was slow to pass through this area but did
pass through after 1 minutes.

Hiatal hernia with moderate gastroesophageal reflux. Moderate
stricture above the hiatal hernia likely due to reflux. Barium
tablet did not pass this area. There are changes of esophagitis
which are likely due to reflux and possibly radiation as well.

## 2019-07-24 ENCOUNTER — Encounter: Payer: Self-pay | Admitting: Pharmacy Technician

## 2019-07-24 ENCOUNTER — Other Ambulatory Visit: Payer: Self-pay

## 2019-07-24 ENCOUNTER — Encounter: Payer: Self-pay | Admitting: Nurse Practitioner

## 2019-07-24 ENCOUNTER — Ambulatory Visit: Payer: Medicaid Other | Admitting: Nurse Practitioner

## 2019-07-24 VITALS — BP 88/60 | HR 94 | Temp 97.7°F | Ht 71.0 in | Wt 135.0 lb

## 2019-07-24 DIAGNOSIS — K222 Esophageal obstruction: Secondary | ICD-10-CM

## 2019-07-24 DIAGNOSIS — K219 Gastro-esophageal reflux disease without esophagitis: Secondary | ICD-10-CM

## 2019-07-24 NOTE — Progress Notes (Deleted)
Patient no longer getting Udenyca from [place] based on Medicaid coverage. Last DOS covered is 07/06/19.

## 2019-07-24 NOTE — Progress Notes (Deleted)
Patient no longer getting Tecentriq from [place] based on Medicaid coverage. Last DOS covered is 07/04/19.

## 2019-07-24 NOTE — Patient Instructions (Signed)
If you are age 53 or older, your body mass index should be between 23-30. Your Body mass index is 18.83 kg/m. If this is out of the aforementioned range listed, please consider follow up with your Primary Care Provider.  If you are age 69 or younger, your body mass index should be between 19-25. Your Body mass index is 18.83 kg/m. If this is out of the aformentioned range listed, please consider follow up with your Primary Care Provider.   You have been given GERD literature.  Follow up as needed.  Thank you for choosing me and [facility].   Tye Savoy, NP

## 2019-07-24 NOTE — Progress Notes (Signed)
IMPRESSION and PLAN:    # Longstanding GERD / LA grade D esophagitis on EGD -Symptoms have improved after switching from Prilosec OTC to omeprazole 40 mg twice daily -Discussed antireflux measures and written literature given  # Esophageal stenosis/stricturing.   --Possibly multifactorial and related to remote esophageal surgery /  radiation / GERD.  --Esophageal dilation not performed at time of EGD since etiology was unclear. Endoscope couldn't traverse the stricture. --Esophageal surgery as a child.  Patient says he was in out of the hospital between the ages of 35 and 54 years old for esophageal problems.  No major problems swallowing in many years.  He has learned to compensate by chewing well and eating very slow.  --Risk of esophageal dilation might outweigh the benefits at this point --Reiterated antireflux measures. 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   --No evidence of metastatic disease in the abdomen and pelvis on PET scan in January  HPI:    Primary GI: Dr. Tarri Glenn  Chief complaint : Follow-up on GERD and abnormal barium swallow  **History comes from the chart and patient  David Berry has returned after undergoing [place] colonoscopy and upper endoscopy for evaluation of longstanding GERD.  Though his GERD symptoms were controlled on PPI, EGD revealed grade D esophagitis.  He had not complained of any dysphagia but a benign-appearing esophageal stenosis was encountered.  The endoscope was unable to traverse the stenosis he was sent for a barium swallow which also showed narrowing/stricture, see results below.  Patient is here for follow-up  We changed Prilosec OTC to omeprazole 40 mg BID and heartburn has improved.  Patient sleeps on pillows, drinks no more than 2 caffeinated beverages a day, he tries to not lay down within 2 hours of eating  Mr. Dacus denied  history of dysphagia at our last visit mid December (prior to EGD and colonoscopy).  Given the results of EGD and barium swallow we had further discussions about swallowing.  As I noted in my previous note, patient described having an esophageal surgery as a child.  Today he elaborates on this and says that between the ages of 54 and 6 years he was constantly in the hospital with swallowing problems.  He says his esophagus was stretched to [facility] at age 54 years.  Since then he is done fairly well and has managed to compensate for any swallowing problems.  He chews well and eat slowly.  Data Reviewed:   Barium swallow 07/19/2019 IMPRESSION: Mild stricture at the C6-7 level likely related to prior esophageal surgery. Barium tablet was slow to pass through this area but did pass through after 1 minutes. Hiatal hernia with moderate gastroesophageal reflux. Moderate stricture above the hiatal hernia likely due to reflux. Barium tablet did not pass this area. There are changes of esophagitis which are likely due to reflux and possibly radiation as well.  EGD 07/09/2019 LA Grade D esophagitis with no bleeding. - Benign-appearing esophageal stenosis. - No specimens collected.  Review of systems:     No chest pain, no SOB, no fevers, no urinary sx   Past Medical History:  Diagnosis Date  . Blood in stool   . Cancer ([place])   . 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 GERD (gastroesophageal reflux disease)   . Lung cancer [facility])     Patient's surgical history, family  medical history, social history, medications and allergies were all reviewed in Epic   Serum creatinine: 0.78 mg/dL 07/04/19 0817 Estimated creatinine clearance: 92.4 mL/min  Current Outpatient Medications  Medication Sig Dispense Refill  . apixaban (ELIQUIS) 5 MG TABS tablet Take 1 tablet (5 mg total) by mouth 2 (two) times daily. 60 tablet 5  . LORazepam (ATIVAN) 0.5 MG tablet Take 1 tablet (0.5 mg total) by mouth every 4  (four) hours as needed for anxiety. 60 tablet 0  . omeprazole (PRILOSEC) 40 MG capsule Take 1 capsule (40 mg total) by mouth 2 (two) times daily. 60 capsule 3  . oxyCODONE (OXY IR/ROXICODONE) 5 MG immediate release tablet Take 1-2 tablets (5-10 mg total) by mouth every 4 (four) hours as needed for severe pain. 60 tablet 0   No current facility-administered medications for this visit.    Physical Exam:     BP (!) 88/60   Pulse 94   Temp 97.7 F (36.5 C)   Ht 5\' 11"  (1.803 m)   Wt 135 lb (61.2 kg)   BMI 18.83 kg/m   GENERAL:  Pleasant male in NAD PSYCH: : Cooperative, normal affect PULM: Normal respiratory effort, lungs CTA bilaterally, no wheezing ABDOMEN:  Nondistended, soft, nontender. No obvious masses, no hepatomegaly,  normal bowel sounds Musculoskeletal:  Normal muscle tone, normal strength NEURO: Alert and oriented x 3, no focal neurologic deficits  I spent 30 minutes total reviewing records, obtaining history, performing exam, counseling patient and documenting visit / findings.   Tye Savoy , NP 07/24/2019, 10:18 AM

## 2019-07-24 NOTE — Progress Notes (Signed)
HEMATOLOGY/ONCOLOGY CLINIC NOTE  Date of Service: 07/25/2019  Patient Care Team: [name], MD as PCP - General (Family Medicine)  CHIEF COMPLAINTS/PURPOSE OF CONSULTATION:  contnued management of Metastatic Poorly differentiated lung adenocarcinoma  HISTORY OF PRESENTING ILLNESS:   David Berry is a 54 year old male with no significant medical history.  He presented to the hospital with progressive right arm and chest pain with new right-sided neck swelling.  The patient states that he was diagnosed with carpal tunnel syndrome received an injection in August.  However, he continued to have progressive pain up and down his right arm that would also radiate to his right chest.  He took ibuprofen with no improvement.  1 week prior to admission, he developed new right sided neck swelling.  He presented to the emergency room for further evaluation.  Work-up in the emergency room was significant for a CT angiogram of the neck and chest which revealed a bulky soft tissue mass in the right neck continuing into the upper chest most compatible with extensive metastatic lymphadenopathy.  This mass measures up to 34 mm and encases multiple vessels including the right upper lobe pulmonary vessels, superior vena cava, and brachiocephalic vessel.  He also had a large poorly defined malignant appearing infiltrative mass measuring 6.2 x 5.6 x 7.9 cm within the right anterior and middle mediastinum with suspected invasion of the distal left brachiocephalic vein and probable tumor occlusion of the right subclavian and jugular veins.  There is also suspected tumor invasion of the upper SVC with string-like narrowing of the SVC but with patency of the SVC just above the right atrium.  The tumor abuts the aorta and great vessels and also results in significant narrowing of the right upper lobe pulmonary arterial vessels.  He also had a CT of the abdomen pelvis which showed a 15 mm heterogeneously enhancing lesion  in the upper pole of the right kidney concerning for renal cell carcinoma, no lymphadenopathy in the abdomen or pelvis, focal hyperenhancement of the anterior left liver most likely related to aberrant venous anatomy/flow, sclerotic lesions in the right acetabulum and left iliac bone are indeterminate.  These are likely bone islands but attention on follow-up is recommended a metastatic disease cannot be excluded.  MRI of the brain with and without contrast did not show any evidence of intracranial metastatic disease. The patient underwent a CT-guided biopsy in interventional radiology on 01/22/2019.  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   When seen today, patient reports ongoing pain and swelling to his right arm and right side of his neck.  Reports ongoing pain to his right chest.  He reports intermittent facial swelling which is worse in the morning and improves throughout the day.  He reports anorexia and a weight loss of 20 pounds over the past few weeks. He has had dizziness for the past few months.  Denies headaches.  He also noted that on the day of admission his peripheral vision was poor.  His vision is now improved.  He denies food getting stuck or difficulty swallowing but does feel as though his throat is "tight."  He reports a nonproductive cough.  Denies fevers and chills.  He is not really having any shortness of breath.  Denies abdominal pain, nausea, vomiting, constipation, diarrhea.  He has not noticed any epistaxis, hemoptysis, hematemesis, hematuria, melena, hematochezia.  The patient is single.  He has 1 daughter who lives  in [place], [place].  Reports occasional alcohol use and currently smokes 3 cigars a day since 1997.  He is currently living in a rooming house with other roommates who all have their own room.  They share a common bathroom and kitchen.  Medical oncology was asked see the  patient to make recommendations regarding his newly diagnosed poorly differentiated carcinoma.  History reviewed. No pertinent past medical history.   Current Treatment:  Atezolizumab + carbo/taxol  INTERVAL HISTORY:   David Baumgart. is a  54 y.o. male who is here for evaluation and management of metastatic poorly differentiated lung adenocarcinoma. He is here for C7 of Atezolizumab. The patient's last visit with [place] was on 07/04/2019. The pt reports that he is doing well overall.  The pt reports he is currently using Omeprazole to help with his acid reflux. He is not currently having any difficulty swallowing and reports and improvement in breathing as well. He is still having some productive phlegm, but denies having any blood in his phlegm. Pt had esophageal dilation when he was a young child. Pt is uninterested in receiving the COVID19 vaccine. 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   Of note since the patient's last visit, pt has had Esophagus ([id number]) completed on 07/19/2019 with results revealing "Mild stricture at the C6-7 level likely related to prior esophageal surgery. Barium tablet was slow to pass through this area but did pass through after 1 minutes. Hiatal hernia with moderate gastroesophageal reflux. Moderate stricture above the hiatal hernia likely due to reflux. Barium tablet did not pass this area. There are changes of esophagitis which are likely due to reflux and possibly radiation as well."  Lab results today (07/25/19) of CBC w/diff and CMP is as follows: all values are WNL except for RBC at 3.65, Hgb at 11.0, HCT at 34.7, RDW at 16.0, Lymphs Abs at 0.6K, Glucose at 100, AST at 12, ALP at 128, Total Bilirubin at <0.2. 07/25/2019 Magnesium at 1.9  On review of systems, pt reports productive cough, healthy appetite and denies rash, swelling of arms/hands, fevers, chills, night sweats, diarrhea, hemoptysis, urinary retention, SOB, difficulty  swallowing, acid reflux and any other symptoms.   MEDICAL HISTORY:  Past Medical History:  Diagnosis Date   Blood in stool    Cancer ([facility])    Chronic bronchitis with emphysema ([facility])    GERD (gastroesophageal reflux disease)    Lung cancer ([place])     SURGICAL HISTORY: Past Surgical History:  Procedure Laterality Date   APPENDECTOMY     teenager   INGUINAL HERNIA REPAIR Right 2016, 2017   [place], 2018 [facility]-removed mesh   IR IMAGING GUIDED PORT INSERTION  04/26/2019    SOCIAL HISTORY: Social History   Socioeconomic History   Marital status: Single    Spouse name: Not on file   Number of children: 1   Years of education: GED   Highest education level: Not on file  Occupational History    Comment: fork lift driver  Tobacco Use   Smoking status: Former Smoker    Types: Cigars   Smokeless tobacco: Never Used   Tobacco comment: smokes 3 black and mild cigars per day  Substance and Sexual Activity   Alcohol use: Not Currently   Drug use: No   Sexual activity: Yes  Other Topics Concern   Not on file  Social History Narrative   Lives alone   Caffeine- coffee, 20 oz daily,  tea occas   Social Determinants of Health   Financial Resource Strain:    Difficulty of Paying Living Expenses:   Food Insecurity:    Worried About Charity fundraiser in the Last Year:    Arboriculturist in the Last Year:   Transportation Needs:    Film/video editor (Medical):    Lack of Transportation (Non-Medical):   Physical Activity:    Days of Exercise per Week:    Minutes of Exercise per Session:   Stress:    Feeling of Stress :   Social Connections:    Frequency of Communication with Friends and Family:    Frequency of Social Gatherings with Friends and Family:    Attends Religious Services:    Active Member of Clubs or Organizations:    Attends Music therapist:    Marital Status:   Intimate Partner Violence:     Fear of Current or Ex-Partner:    Emotionally Abused:    Physically Abused:    Sexually Abused:     FAMILY HISTORY: Family History  Problem Relation Age of Onset   Prostate cancer Father     ALLERGIES:  is allergic to pregabalin.  MEDICATIONS:  Current Outpatient Medications  Medication Sig Dispense Refill   apixaban (ELIQUIS) 5 MG TABS tablet Take 1 tablet (5 mg total) by mouth 2 (two) times daily. 60 tablet 5   LORazepam (ATIVAN) 0.5 MG tablet Take 1 tablet (0.5 mg total) by mouth every 4 (four) hours as needed for anxiety. 60 tablet 0   omeprazole (PRILOSEC) 40 MG capsule Take 1 capsule (40 mg total) by mouth 2 (two) times daily. 60 capsule 3   oxyCODONE (OXY IR/ROXICODONE) 5 MG immediate release tablet Take 1-2 tablets (5-10 mg total) by mouth every 4 (four) hours as needed for severe pain. 60 tablet 0   No current facility-administered medications for this visit.    REVIEW OF SYSTEMS:   A 10+ POINT REVIEW OF SYSTEMS WAS OBTAINED including neurology, dermatology, psychiatry, cardiac, respiratory, lymph, extremities, GI, GU, Musculoskeletal, constitutional, breasts, reproductive, HEENT.  All pertinent positives are noted in the HPI.  All others are negative.   PHYSICAL EXAMINATION: ECOG FS:1 - Symptomatic but completely ambulatory  Vitals:   07/25/19 0858  BP: 94/73  Pulse: 87  Resp: 18  Temp: 98 F (36.7 C)  SpO2: 99%   Wt Readings from Last 3 Encounters:  07/25/19 135 lb 11.2 oz (61.6 kg)  07/24/19 135 lb (61.2 kg)  07/09/19 136 lb (61.7 kg)   Body mass index is 18.93 kg/m.    GENERAL:alert, in no acute distress and comfortable SKIN: no acute rashes, no significant lesions EYES: conjunctiva are pink and non-injected, sclera anicteric OROPHARYNX: MMM, no exudates, no oropharyngeal erythema or ulceration NECK: supple, no JVD LYMPH:  no palpable lymphadenopathy in the cervical, axillary or inguinal regions LUNGS: clear to auscultation b/l with normal  respiratory effort HEART: regular rate & rhythm ABDOMEN:  normoactive bowel sounds , non tender, not distended. No palpable hepatosplenomegaly.  Extremity: no pedal edema PSYCH: alert & oriented x 3 with fluent speech NEURO: no focal motor/sensory deficits  LABORATORY DATA:  I have reviewed the data as listed  . CBC Latest Ref Rng & Units 07/25/2019 07/04/2019 06/13/2019  WBC 4.0 - 10.5 K/uL 4.1 3.7(L) 5.9  Hemoglobin 13.0 - 17.0 g/dL 11.0(L) 11.9(L) 12.3(L)  Hematocrit 39.0 - 52.0 % 34.7(L) 37.2(L) 39.0  Platelets 150 - 400 K/uL 291 289 344    .  CMP Latest Ref Rng & Units 07/25/2019 07/04/2019 06/13/2019  Glucose 70 - 99 mg/dL 100(H) 95 96  BUN 6 - 20 mg/dL '7 9 7  '$ Creatinine 0.61 - 1.24 mg/dL 0.77 0.78 0.78  Sodium 135 - 145 mmol/L 142 141 138  Potassium 3.5 - 5.1 mmol/L 3.9 4.0 4.1  Chloride 98 - 111 mmol/L 107 105 104  CO2 22 - 32 mmol/L '26 26 28  '$ Calcium 8.9 - 10.3 mg/dL 9.1 9.4 9.6  Total Protein 6.5 - 8.1 g/dL 6.7 7.2 7.3  Total Bilirubin 0.3 - 1.2 mg/dL <0.2(L) 0.4 0.2(L)  Alkaline Phos 38 - 126 U/L 128(H) 141(H) 136(H)  AST 15 - 41 U/L 12(L) 12(L) 13(L)  ALT 0 - 44 U/L '12 12 12    '$ 01/22/2019 [facility]: Tumor Mutational Burden     01/22/2019 PD-L1 Immunohistochemistry Analysis    01/22/2019 Soft Tissue Needle Core Biopsy Surgical Pathology     RADIOGRAPHIC STUDIES: I have personally reviewed the radiological images as listed and agreed with the findings in the report. DG ESOPHAGUS W DOUBLE CM (HD)  Result Date: 07/19/2019 CLINICAL DATA:  Gastroesophageal reflux disease. Esophageal stricture. 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 Current history of lung cancer. EXAM: ESOPHOGRAM / BARIUM SWALLOW / BARIUM TABLET STUDY TECHNIQUE: Combined double contrast and single contrast examination performed using effervescent crystals, thick barium liquid, and thin barium liquid. The patient was observed with fluoroscopy swallowing a 13 mm  barium sulphate tablet. FLUOROSCOPY TIME:  Fluoroscopy Time:  1 minute 42 second Radiation Exposure Index (if provided by the fluoroscopic device): Number of Acquired Spot Images: 0 COMPARISON:  None. FINDINGS: Smooth mild stricture of the esophagus at the C6-7 level. This may be related to prior esophageal stricture and surgery. Barium tablet was slow to pass through this area but did pass after approximately 1 minute. Dilated esophagus. Irregularity of the esophageal mucosa particularly on the left side most likely due to esophagitis. Radiation therapy could also be a factor. Moderately severe smooth stricture distal esophagus just above the hiatal hernia. Barium tablet did not pass this area after approximately 5 minutes. Moderate hiatal hernia.  Mild-to-moderate gastroesophageal reflux. Right upper lobe airspace density consistent with prior treated cancer. Port-A-Cath in place. IMPRESSION: Mild stricture at the C6-7 level likely related to prior esophageal surgery. Barium tablet was slow to pass through this area but did pass through after 1 minutes. Hiatal hernia with moderate gastroesophageal reflux. Moderate stricture above the hiatal hernia likely due to reflux. Barium tablet did not pass this area. There are changes of esophagitis which are likely due to reflux and possibly radiation as well. Electronically Signed   By: Franchot Gallo M.D.   On: 07/19/2019 10:29    ASSESSMENT & PLAN:   This is a 54 year old malewith  1. Newly diagnosed Metastatic Poorly differentiated lung adenocarcinoma Presented with Large neck mass, mediastinal mass, renal mass, and questionable bone lesions no brain mets on MRI brain 01/22/2019 PD-L1 Immunohistochemistry Analysis which revealed "Tumor Proportion Score (TPS) 50%" 05/16/2019 PET/CT ([id number]) revealed "Radiation changes in the right hemithorax, as above. Improving mediastinal nodal metastases. Prior bulky right cervical metastases have resolved. 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 Acute DVT- Right IJ, Right Innominate Vein, and likely also the SVC- related to malignancy+ tobacco-  on lovenox  4. Symptomatic hemorrhoids  5. Normocytic anemia -Likely due to underlying malignancy -Will check ferritin,  iron studies, vitamin B12 level, and RBC folate in the a.m.  6.Thrombocytosis -- likely due to paraneoplastic effect of tumor and reactive due to inflammation and tissue inflammation from RT.-- now resolved.  7. Nicotine dependence -He was counseled about tobacco cessation.  PLAN: -Discussed pt labwork today, 07/25/19;  all values are WNL except for RBC at 3.65, Hgb at 11.0, HCT at 34.7, RDW at 16.0, Lymphs Abs at 0.6K, Glucose at 100, AST at 12, ALP at 128, Total Bilirubin at <0.2. -Discussed 07/25/2019 Magnesium at 1.9 -Discussed 07/19/2019 Esophagus ([id number]) which revealed "Mild stricture at the C6-7 level likely related to prior esophageal surgery. Barium tablet was slow to pass through this area but did pass through after 1 minutes. Hiatal hernia with moderate gastroesophageal reflux. Moderate stricture above the hiatal hernia likely due to reflux. Barium tablet did not pass this area. There are changes of esophagitis which are likely due to reflux and possibly radiation as well." -Stricture could be due radiation or acid reflux  -The pt has no prohibitive toxicities from continuing C7D1 of Atezolizumab at this time -Will continue Atezolizumab until progression or intolerance. -Will continue to monitor with clinic visits every 3 weeks and scans every 3-4 months -Recommend pt receive the COVID19 vaccine - pt is not interested in receiving the vaccine -Will see back in 3 weeks with labs   FOLLOW UP: -Please schedule next 3 cycles of Atezolizumab treatment with portflush, labs and MD visit   The total time spent in the appt was 30 minutes and  more than 50% was on counseling and direct patient cares and co-ordination of cares, placing, signing and monitoring of treatment  All of the patient's questions were answered with apparent satisfaction. The patient knows to call the clinic with any problems, questions or concerns.   Sullivan Lone MD [place] AAHIVMS [facility] Hematology/Oncology Physician [facility]  (Office):       [phone] (Work cell):  [phone] (Fax):           [phone]  07/25/2019 9:54 AM  I, Yevette Edwards, am acting as a scribe for Dr. Sullivan Lone.   .I have reviewed the above documentation for accuracy and completeness, and I agree with the above. Brunetta Genera MD

## 2019-07-25 ENCOUNTER — Inpatient Hospital Stay: Payer: Medicaid Other

## 2019-07-25 ENCOUNTER — Inpatient Hospital Stay: Payer: Medicaid Other | Attending: Hematology | Admitting: Hematology

## 2019-07-25 ENCOUNTER — Ambulatory Visit: Payer: Medicaid Other

## 2019-07-25 ENCOUNTER — Other Ambulatory Visit: Payer: Self-pay

## 2019-07-25 VITALS — BP 94/73 | HR 87 | Temp 98.0°F | Resp 18 | Ht 71.0 in | Wt 135.7 lb

## 2019-07-25 DIAGNOSIS — Z86718 Personal history of other venous thrombosis and embolism: Secondary | ICD-10-CM | POA: Diagnosis not present

## 2019-07-25 DIAGNOSIS — C3411 Malignant neoplasm of upper lobe, right bronchus or lung: Secondary | ICD-10-CM | POA: Diagnosis present

## 2019-07-25 DIAGNOSIS — I82621 Acute embolism and thrombosis of deep veins of right upper extremity: Secondary | ICD-10-CM

## 2019-07-25 DIAGNOSIS — Z7189 Other specified counseling: Secondary | ICD-10-CM

## 2019-07-25 DIAGNOSIS — Z8042 Family history of malignant neoplasm of prostate: Secondary | ICD-10-CM | POA: Insufficient documentation

## 2019-07-25 DIAGNOSIS — C3491 Malignant neoplasm of unspecified part of right bronchus or lung: Secondary | ICD-10-CM

## 2019-07-25 DIAGNOSIS — Z7901 Long term (current) use of anticoagulants: Secondary | ICD-10-CM | POA: Insufficient documentation

## 2019-07-25 DIAGNOSIS — Z95828 Presence of other vascular implants and grafts: Secondary | ICD-10-CM

## 2019-07-25 DIAGNOSIS — C771 Secondary and unspecified malignant neoplasm of intrathoracic lymph nodes: Secondary | ICD-10-CM | POA: Insufficient documentation

## 2019-07-25 DIAGNOSIS — Z5112 Encounter for antineoplastic immunotherapy: Secondary | ICD-10-CM | POA: Insufficient documentation

## 2019-07-25 DIAGNOSIS — Z5111 Encounter for antineoplastic chemotherapy: Secondary | ICD-10-CM

## 2019-07-25 DIAGNOSIS — F17210 Nicotine dependence, cigarettes, uncomplicated: Secondary | ICD-10-CM | POA: Diagnosis not present

## 2019-07-25 DIAGNOSIS — D649 Anemia, unspecified: Secondary | ICD-10-CM | POA: Diagnosis not present

## 2019-07-25 DIAGNOSIS — G893 Neoplasm related pain (acute) (chronic): Secondary | ICD-10-CM

## 2019-07-25 DIAGNOSIS — Z79899 Other long term (current) drug therapy: Secondary | ICD-10-CM | POA: Insufficient documentation

## 2019-07-25 LAB — CBC WITH DIFFERENTIAL/PLATELET
Abs Immature Granulocytes: 0.01 10*3/uL (ref 0.00–0.07)
Basophils Absolute: 0 10*3/uL (ref 0.0–0.1)
Basophils Relative: 0 %
Eosinophils Absolute: 0 10*3/uL (ref 0.0–0.5)
Eosinophils Relative: 1 %
HCT: 34.7 % — ABNORMAL LOW (ref 39.0–52.0)
Hemoglobin: 11 g/dL — ABNORMAL LOW (ref 13.0–17.0)
Immature Granulocytes: 0 %
Lymphocytes Relative: 14 %
Lymphs Abs: 0.6 10*3/uL — ABNORMAL LOW (ref 0.7–4.0)
MCH: 30.1 pg (ref 26.0–34.0)
MCHC: 31.7 g/dL (ref 30.0–36.0)
MCV: 95.1 fL (ref 80.0–100.0)
Monocytes Absolute: 0.7 10*3/uL (ref 0.1–1.0)
Monocytes Relative: 17 %
Neutro Abs: 2.8 10*3/uL (ref 1.7–7.7)
Neutrophils Relative %: 68 %
Platelets: 291 10*3/uL (ref 150–400)
RBC: 3.65 MIL/uL — ABNORMAL LOW (ref 4.22–5.81)
RDW: 16 % — ABNORMAL HIGH (ref 11.5–15.5)
WBC: 4.1 10*3/uL (ref 4.0–10.5)
nRBC: 0 % (ref 0.0–0.2)

## 2019-07-25 LAB — CMP (CANCER CENTER ONLY)
ALT: 12 U/L (ref 0–44)
AST: 12 U/L — ABNORMAL LOW (ref 15–41)
Albumin: 3.5 g/dL (ref 3.5–5.0)
Alkaline Phosphatase: 128 U/L — ABNORMAL HIGH (ref 38–126)
Anion gap: 9 (ref 5–15)
BUN: 7 mg/dL (ref 6–20)
CO2: 26 mmol/L (ref 22–32)
Calcium: 9.1 mg/dL (ref 8.9–10.3)
Chloride: 107 mmol/L (ref 98–111)
Creatinine: 0.77 mg/dL (ref 0.61–1.24)
GFR, Est AFR Am: >60 mL/min (ref >60)
GFR, Estimated: >60 mL/min (ref >60)
Glucose, Bld: 100 mg/dL — ABNORMAL HIGH (ref 70–99)
Potassium: 3.9 mmol/L (ref 3.5–5.1)
Sodium: 142 mmol/L (ref 135–145)
Total Bilirubin: <0.2 mg/dL — ABNORMAL LOW (ref 0.3–1.2)
Total Protein: 6.7 g/dL (ref 6.5–8.1)

## 2019-07-25 LAB — MAGNESIUM: Magnesium: 1.9 mg/dL (ref 1.7–2.4)

## 2019-07-25 MED ORDER — DIPHENHYDRAMINE HCL 25 MG PO CAPS
ORAL_CAPSULE | ORAL | Status: AC
  Filled 2019-07-25: qty 1

## 2019-07-25 MED ORDER — SODIUM CHLORIDE 0.9 % IV SOLN
Freq: Once | INTRAVENOUS | Status: AC
  Filled 2019-07-25: qty 250

## 2019-07-25 MED ORDER — ACETAMINOPHEN 325 MG PO TABS
ORAL_TABLET | ORAL | Status: AC
  Filled 2019-07-25: qty 2

## 2019-07-25 MED ORDER — FAMOTIDINE 20 MG PO TABS
ORAL_TABLET | ORAL | Status: AC
  Filled 2019-07-25: qty 1

## 2019-07-25 MED ORDER — HEPARIN SOD (PORK) LOCK FLUSH 100 UNIT/ML IV SOLN
500.0000 [IU] | Freq: Once | INTRAVENOUS | Status: AC | PRN
  Administered 2019-07-25: 500 [IU]
  Filled 2019-07-25: qty 5

## 2019-07-25 MED ORDER — SODIUM CHLORIDE 0.9 % IV SOLN
1200.0000 mg | Freq: Once | INTRAVENOUS | Status: AC
  Administered 2019-07-25: 11:00:00 1200 mg via INTRAVENOUS
  Filled 2019-07-25: qty 20

## 2019-07-25 MED ORDER — FAMOTIDINE 20 MG PO TABS
20.0000 mg | ORAL_TABLET | Freq: Once | ORAL | Status: AC
  Administered 2019-07-25: 20 mg via ORAL

## 2019-07-25 MED ORDER — SODIUM CHLORIDE 0.9% FLUSH
10.0000 mL | INTRAVENOUS | Status: DC | PRN
  Administered 2019-07-25: 10 mL
  Filled 2019-07-25: qty 10

## 2019-07-25 MED ORDER — DIPHENHYDRAMINE HCL 25 MG PO TABS
25.0000 mg | ORAL_TABLET | Freq: Once | ORAL | Status: AC
  Administered 2019-07-25: 11:00:00 25 mg via ORAL
  Filled 2019-07-25: qty 1

## 2019-07-25 MED ORDER — ACETAMINOPHEN 325 MG PO TABS
650.0000 mg | ORAL_TABLET | Freq: Once | ORAL | Status: AC
  Administered 2019-07-25: 11:00:00 650 mg via ORAL

## 2019-07-25 NOTE — Patient Instructions (Signed)
[facility] Discharge Instructions for Patients Receiving Immunotherapy and Chemotherapy  Today you received the following immunotherapy and chemotherapy agents: Atezolizumab (Tencentriq)  To help prevent nausea and vomiting after your treatment, we encourage you to take your nausea medication as directed by your provider.   If you develop nausea and vomiting that is not controlled by your nausea medication, call the clinic.   BELOW ARE SYMPTOMS THAT SHOULD BE REPORTED IMMEDIATELY:  *FEVER GREATER THAN 100.5 F  *CHILLS WITH OR WITHOUT FEVER  NAUSEA AND VOMITING THAT IS NOT CONTROLLED WITH YOUR NAUSEA MEDICATION  *UNUSUAL SHORTNESS OF BREATH  *UNUSUAL BRUISING OR BLEEDING  TENDERNESS IN MOUTH AND THROAT WITH OR WITHOUT PRESENCE OF ULCERS  *URINARY PROBLEMS  *BOWEL PROBLEMS  UNUSUAL RASH Items with * indicate a potential emergency and should be followed up as soon as possible.  Feel free to call the clinic should you have any questions or concerns. The clinic phone number is [phone].  Please show the [facility] at check-in to the Emergency Department and triage nurse.  Coronavirus (COVID-19) Are you at risk?  Are you at risk for the Coronavirus (COVID-19)?  To be considered HIGH RISK for Coronavirus (COVID-19), you have to meet the following criteria:  . Traveled to [place], [place], [place], [place] or [place]; or in the [place] to [place], [place], [place], or [place]; and have fever, cough, and shortness of breath within the last 2 weeks of travel OR . Been in close contact with a person diagnosed with COVID-19 within the last 2 weeks and have fever, cough, and shortness of breath . IF YOU DO NOT MEET THESE CRITERIA, YOU ARE CONSIDERED LOW RISK FOR COVID-19.  What to do if you are HIGH RISK for COVID-19?  [name] If you are having a medical emergency, call 911. . Seek medical care right away. Before you go to a doctor's office, urgent  care or emergency department, call ahead and tell them about your recent travel, contact with someone diagnosed with COVID-19, and your symptoms. You should receive instructions from your physician's office regarding next steps of care.  . When you arrive at healthcare provider, tell the healthcare staff immediately you have returned from visiting [place], [place], [place], [place] or [place]; or traveled in the [place] to [place], [place], [place], or [place]; in the last two weeks or you have been in close contact with a person diagnosed with COVID-19 in the last 2 weeks.   . Tell the health care staff about your symptoms: fever, cough and shortness of breath. . After you have been seen by a medical provider, you will be either: o Tested for (COVID-19) and discharged home on quarantine except to seek medical care if symptoms worsen, and asked to  - Stay home and avoid contact with others until you get your results (4-5 days)  - Avoid travel on public transportation if possible (such as bus, train, or airplane) or o Sent to the Emergency Department by EMS for evaluation, COVID-19 testing, and possible admission depending on your condition and test results.  What to do if you are LOW RISK for COVID-19?  Reduce your risk of any infection by using the same precautions used for avoiding the common cold or flu:  [name] Wash your hands often with soap and warm water for at least 20 seconds.  If soap and water are not readily available, use an alcohol-based hand sanitizer with at least  60% alcohol.  . If coughing or sneezing, cover your mouth and nose by coughing or sneezing into the elbow areas of your shirt or coat, into a tissue or into your sleeve (not your hands). . Avoid shaking hands with others and consider head nods or verbal greetings only. . Avoid touching your eyes, nose, or mouth with unwashed hands.  . Avoid close contact with people who are sick. . Avoid places or events with large  numbers of people in one location, like concerts or sporting events. . Carefully consider travel plans you have or are making. . If you are planning any travel outside or inside the [place], visit the CDC's Travelers' Health webpage for the latest health notices. . If you have some symptoms but not all symptoms, continue to monitor at home and seek medical attention if your symptoms worsen. . If you are having a medical emergency, call 911.   [place] / e-Visit: [email or web address]         [facility]: [facility]: [phone]                   [facility]: [phone]

## 2019-07-27 ENCOUNTER — Telehealth: Payer: Self-pay | Admitting: Hematology

## 2019-07-27 ENCOUNTER — Ambulatory Visit: Payer: Medicaid Other

## 2019-07-27 NOTE — Telephone Encounter (Signed)
Scheduled per 03/17 los, patient has been called and notified.

## 2019-08-02 NOTE — Progress Notes (Signed)
Reviewed and agree with management plans. ? ?Ludmilla Mcgillis L. Joanne Salah, MD, MPH  ?

## 2019-08-07 DIAGNOSIS — H40013 Open angle with borderline findings, low risk, bilateral: Secondary | ICD-10-CM | POA: Diagnosis not present

## 2019-08-07 DIAGNOSIS — H1789 Other corneal scars and opacities: Secondary | ICD-10-CM | POA: Diagnosis not present

## 2019-08-07 DIAGNOSIS — H524 Presbyopia: Secondary | ICD-10-CM | POA: Diagnosis not present

## 2019-08-09 NOTE — Progress Notes (Signed)

## 2019-08-14 NOTE — Progress Notes (Signed)
HEMATOLOGY/ONCOLOGY CLINIC NOTE  Date of Service: 08/15/2019  Patient Care Team: [name], MD as PCP - General (Family Medicine)  CHIEF COMPLAINTS/PURPOSE OF CONSULTATION:  contnued management of Metastatic Poorly differentiated lung adenocarcinoma  HISTORY OF PRESENTING ILLNESS:   Mr. Gallier is a 54 year old male with no significant medical history.  He presented to the hospital with progressive right arm and chest pain with new right-sided neck swelling.  The patient states that he was diagnosed with carpal tunnel syndrome received an injection in August.  However, he continued to have progressive pain up and down his right arm that would also radiate to his right chest.  He took ibuprofen with no improvement.  1 week prior to admission, he developed new right sided neck swelling.  He presented to the emergency room for further evaluation.  Work-up in the emergency room was significant for a CT angiogram of the neck and chest which revealed a bulky soft tissue mass in the right neck continuing into the upper chest most compatible with extensive metastatic lymphadenopathy.  This mass measures up to 34 mm and encases multiple vessels including the right upper lobe pulmonary vessels, superior vena cava, and brachiocephalic vessel.  He also had a large poorly defined malignant appearing infiltrative mass measuring 6.2 x 5.6 x 7.9 cm within the right anterior and middle mediastinum with suspected invasion of the distal left brachiocephalic vein and probable tumor occlusion of the right subclavian and jugular veins.  There is also suspected tumor invasion of the upper SVC with string-like narrowing of the SVC but with patency of the SVC just above the right atrium.  The tumor abuts the aorta and great vessels and also results in significant narrowing of the right upper lobe pulmonary arterial vessels.  He also had a CT of the abdomen pelvis which showed a 15 mm heterogeneously enhancing lesion in  the upper pole of the right kidney concerning for renal cell carcinoma, no lymphadenopathy in the abdomen or pelvis, focal hyperenhancement of the anterior left liver most likely related to aberrant venous anatomy/flow, sclerotic lesions in the right acetabulum and left iliac bone are indeterminate.  These are likely bone islands but attention on follow-up is recommended a metastatic disease cannot be excluded.  MRI of the brain with and without contrast did not show any evidence of intracranial metastatic disease. The patient underwent a CT-guided biopsy in interventional radiology on 01/22/2019.  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   When seen today, patient reports ongoing pain and swelling to his right arm and right side of his neck.  Reports ongoing pain to his right chest.  He reports intermittent facial swelling which is worse in the morning and improves throughout the day.  He reports anorexia and a weight loss of 20 pounds over the past few weeks. He has had dizziness for the past few months.  Denies headaches.  He also noted that on the day of admission his peripheral vision was poor.  His vision is now improved.  He denies food getting stuck or difficulty swallowing but does feel as though his throat is "tight."  He reports a nonproductive cough.  Denies fevers and chills.  He is not really having any shortness of breath.  Denies abdominal pain, nausea, vomiting, constipation, diarrhea.  He has not noticed any epistaxis, hemoptysis, hematemesis, hematuria, melena, hematochezia.  The patient is single.  He has 1 daughter who lives  in [place], [place].  Reports occasional alcohol use and currently smokes 3 cigars a day since 1997.  He is currently living in a rooming house with other roommates who all have their own room.  They share a common bathroom and kitchen.  Medical oncology was asked see the patient  to make recommendations regarding his newly diagnosed poorly differentiated carcinoma.  History reviewed. No pertinent past medical history.   Current Treatment:  Atezolizumab + carbo/taxol  INTERVAL HISTORY:   Klay Sobotka. is a  54 y.o. male who is here for evaluation and management of metastatic poorly differentiated lung adenocarcinoma. He is here for C8 of Atezolizumab. The patient's last visit with [place] was on 07/25/2019. The pt reports that he is doing well overall.  The pt reports that he noticed an abscessed tooth a couple of days ago. He had a crown on the tooth that is now gone. It is painful to the touch and it has kept him from eating on occasion. His right hand also started swelling a couple of days ago. Pt has previously been diagnosed with Carpal tunnel in his right wrist. He has continued taking Eliquis as prescribed. His reflux has improved since beginning to take Omeprazole.   Lab results today (08/15/19) of CBC w/diff and CMP is as follows: all values are WNL except for RBC at 3.83, Hgb at 11.5, HCT at 36.4, Lymphs Abs at 0.6K, ALP at 133. 08/15/2019 Magnesium at 1.9 08/15/2019 T4 free at 0.89 08/15/2019 TSH at 0.296  On review of systems, pt reports chills, right hand swelling, tooth pain and denies fevers, rashes, neck pain, SOB, difficulty swallowing, reflux and any other symptoms.    MEDICAL HISTORY:  Past Medical History:  Diagnosis Date  . Blood in stool   . Cancer ([place])   . Chronic bronchitis with emphysema ([place])   . GERD (gastroesophageal reflux disease)   . Lung cancer [facility])     SURGICAL HISTORY: Past Surgical History:  Procedure Laterality Date  . APPENDECTOMY     teenager  . INGUINAL HERNIA REPAIR Right 2016, 2017   [facility], 2018 [facility] mesh  . IR IMAGING GUIDED PORT INSERTION  04/26/2019    SOCIAL HISTORY: Social History   Socioeconomic History  . Marital status: Single    Spouse name: Not on file  . Number of  children: 1  . Years of education: GED  . Highest education level: Not on file  Occupational History    Comment: fork lift driver  Tobacco Use  . Smoking status: Former Smoker    Types: Cigars  . Smokeless tobacco: Never Used  . Tobacco comment: smokes 3 black and mild cigars per day  Substance and Sexual Activity  . Alcohol use: Not Currently  . Drug use: No  . Sexual activity: Yes  Other Topics Concern  . Not on file  Social History Narrative   Lives alone   Caffeine- coffee, 20 oz daily, tea occas   Social Determinants of Health   Financial Resource Strain:   . Difficulty of Paying Living Expenses:   Food Insecurity:   . Worried About Charity fundraiser in the Last Year:   . Arboriculturist in the Last Year:   Transportation Needs:   . Film/video editor (Medical):   [name] Lack of Transportation (Non-Medical):   Physical Activity:   . Days of Exercise per Week:   . Minutes of Exercise per Session:   Stress:   .  Feeling of Stress :   Social Connections:   . Frequency of Communication with Friends and Family:   . Frequency of Social Gatherings with Friends and Family:   . Attends Religious Services:   . Active Member of Clubs or Organizations:   . Attends Archivist Meetings:   [name] Marital Status:   Intimate Partner Violence:   . Fear of Current or Ex-Partner:   . Emotionally Abused:   [name] Physically Abused:   . Sexually Abused:     FAMILY HISTORY: Family History  Problem Relation Age of Onset  . Prostate cancer Father     ALLERGIES:  is allergic to pregabalin.  MEDICATIONS:  Current Outpatient Medications  Medication Sig Dispense Refill  . apixaban (ELIQUIS) 5 MG TABS tablet Take 1 tablet (5 mg total) by mouth 2 (two) times daily. 60 tablet 5  . LORazepam (ATIVAN) 0.5 MG tablet Take 1 tablet (0.5 mg total) by mouth every 4 (four) hours as needed for anxiety. 60 tablet 0  . omeprazole (PRILOSEC) 40 MG capsule Take 1 capsule (40 mg total) by mouth  2 (two) times daily. 60 capsule 3  . oxyCODONE (OXY IR/ROXICODONE) 5 MG immediate release tablet Take 1-2 tablets (5-10 mg total) by mouth every 4 (four) hours as needed for severe pain. 60 tablet 0  . amoxicillin-clavulanate (AUGMENTIN) 875-125 MG tablet Take 1 tablet by mouth 2 (two) times daily. 20 tablet 0   No current facility-administered medications for this visit.   Facility-Administered Medications Ordered in Other Visits  Medication Dose Route Frequency Provider Last Rate Last Admin  . sodium chloride flush (NS) 0.9 % injection 10 mL  10 mL Intracatheter PRN Brunetta Genera, MD   10 mL at 08/15/19 1239    REVIEW OF SYSTEMS:   A 10+ POINT REVIEW OF SYSTEMS WAS OBTAINED including neurology, dermatology, psychiatry, cardiac, respiratory, lymph, extremities, GI, GU, Musculoskeletal, constitutional, breasts, reproductive, HEENT.  All pertinent positives are noted in the HPI.  All others are negative.    PHYSICAL EXAMINATION: ECOG FS:1 - Symptomatic but completely ambulatory  Vitals:   08/15/19 1018  BP: (!) 137/59  Pulse: 84  Resp: 20  Temp: 98.2 F (36.8 C)  SpO2: 100%   Wt Readings from Last 3 Encounters:  08/15/19 137 lb 9.6 oz (62.4 kg)  07/25/19 135 lb 11.2 oz (61.6 kg)  07/24/19 135 lb (61.2 kg)   Body mass index is 19.19 kg/m.    GENERAL:alert, in no acute distress and comfortable, right lower jaw abscessed tooth  SKIN: no acute rashes, no significant lesions EYES: conjunctiva are pink and non-injected, sclera anicteric OROPHARYNX: MMM, no exudates, no oropharyngeal erythema or ulceration NECK: supple, no JVD LYMPH:  no palpable lymphadenopathy in the cervical, axillary or inguinal regions LUNGS: clear to auscultation b/l with normal respiratory effort HEART: regular rate & rhythm ABDOMEN:  normoactive bowel sounds , non tender, not distended. No palpable hepatosplenomegaly.  Extremity: no pedal edema PSYCH: alert & oriented x 3 with fluent speech NEURO:  no focal motor/sensory deficits  LABORATORY DATA:  I have reviewed the data as listed  . CBC Latest Ref Rng & Units 08/15/2019 07/25/2019 07/04/2019  WBC 4.0 - 10.5 K/uL 4.1 4.1 3.7(L)  Hemoglobin 13.0 - 17.0 g/dL 11.5(L) 11.0(L) 11.9(L)  Hematocrit 39.0 - 52.0 % 36.4(L) 34.7(L) 37.2(L)  Platelets 150 - 400 K/uL 245 291 289    . CMP Latest Ref Rng & Units 08/15/2019 07/25/2019 07/04/2019  Glucose 70 - 99 mg/dL  92 100(H) 95  BUN 6 - 20 mg/dL '9 7 9  ' Creatinine 0.61 - 1.24 mg/dL 0.78 0.77 0.78  Sodium 135 - 145 mmol/L 141 142 141  Potassium 3.5 - 5.1 mmol/L 4.1 3.9 4.0  Chloride 98 - 111 mmol/L 108 107 105  CO2 22 - 32 mmol/L '25 26 26  ' Calcium 8.9 - 10.3 mg/dL 9.3 9.1 9.4  Total Protein 6.5 - 8.1 g/dL 7.0 6.7 7.2  Total Bilirubin 0.3 - 1.2 mg/dL 0.3 <0.2(L) 0.4  Alkaline Phos 38 - 126 U/L 133(H) 128(H) 141(H)  AST 15 - 41 U/L 16 12(L) 12(L)  ALT 0 - 44 U/L '17 12 12    ' 01/22/2019 [facility]: Tumor Mutational Burden     01/22/2019 PD-L1 Immunohistochemistry Analysis    01/22/2019 Soft Tissue Needle Core Biopsy Surgical Pathology     RADIOGRAPHIC STUDIES: I have personally reviewed the radiological images as listed and agreed with the findings in the report. DG ESOPHAGUS W DOUBLE CM (HD)  Result Date: 07/19/2019 CLINICAL DATA:  Gastroesophageal reflux disease. Esophageal stricture. 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 Current history of lung cancer. EXAM: ESOPHOGRAM / BARIUM SWALLOW / BARIUM TABLET STUDY TECHNIQUE: Combined double contrast and single contrast examination performed using effervescent crystals, thick barium liquid, and thin barium liquid. The patient was observed with fluoroscopy swallowing a 13 mm barium sulphate tablet. FLUOROSCOPY TIME:  Fluoroscopy Time:  1 minute 42 second Radiation Exposure Index (if provided by the fluoroscopic device): Number of Acquired Spot Images: 0 COMPARISON:  None. FINDINGS: Smooth mild stricture  of the esophagus at the C6-7 level. This may be related to prior esophageal stricture and surgery. Barium tablet was slow to pass through this area but did pass after approximately 1 minute. Dilated esophagus. Irregularity of the esophageal mucosa particularly on the left side most likely due to esophagitis. Radiation therapy could also be a factor. Moderately severe smooth stricture distal esophagus just above the hiatal hernia. Barium tablet did not pass this area after approximately 5 minutes. Moderate hiatal hernia.  Mild-to-moderate gastroesophageal reflux. Right upper lobe airspace density consistent with prior treated cancer. Port-A-Cath in place. IMPRESSION: Mild stricture at the C6-7 level likely related to prior esophageal surgery. Barium tablet was slow to pass through this area but did pass through after 1 minutes. Hiatal hernia with moderate gastroesophageal reflux. Moderate stricture above the hiatal hernia likely due to reflux. Barium tablet did not pass this area. There are changes of esophagitis which are likely due to reflux and possibly radiation as well. Electronically Signed   By: Franchot Gallo M.D.   On: 07/19/2019 10:29    ASSESSMENT & PLAN:   This is a 53 year old malewith  1. Newly diagnosed Metastatic Poorly differentiated lung adenocarcinoma Presented with Large neck mass, mediastinal mass, renal mass, and questionable bone lesions no brain mets on MRI brain 01/22/2019 PD-L1 Immunohistochemistry Analysis which revealed "Tumor Proportion Score (TPS) 50%" 05/16/2019 PET/CT ([id number]) revealed "Radiation changes in the right hemithorax, as above. Improving mediastinal nodal metastases. Prior bulky right cervical metastases have resolved. No evidence of metastatic disease in the abdomen/pelvis." 07/19/2019 Esophagus Scan ([id number]) revealed "Mild stricture at the C6-7 level likely related to prior esophageal surgery. Barium tablet was slow to pass through this area but did  pass through after 1 minutes. Hiatal hernia with moderate gastroesophageal reflux. Moderate stricture above the hiatal hernia likely due to reflux. Barium tablet did not pass this area. 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 Acute DVT- Right IJ, Right Innominate Vein, and likely also the SVC- related to malignancy+ tobacco-  on lovenox  4. Symptomatic hemorrhoids  5. Normocytic anemia -Likely due to underlying malignancy -Will check ferritin, iron studies, vitamin B12 level, and RBC folate in the a.m.  6.Thrombocytosis -- likely due to paraneoplastic effect of tumor and reactive due to inflammation and tissue inflammation from RT.-- now resolved.  7. Nicotine dependence -He was counseled about tobacco cessation.  8. Dental abscess PLAN: -Discussed pt labwork today, 08/15/19;  all values are WNL except for RBC at 3.83, Hgb at 11.5, HCT at 36.4, Lymphs Abs at 0.6K, ALP at 133. -Discussed 08/15/2019 Magnesium at 1.9 -Discussed 08/15/2019 TSH is low at 0.296 -Discussed 08/15/2019 T4 free is WNL -The pt has no prohibitive toxicities from continuing C8D1 of Atezolizumab at this time -Will continue Atezolizumab until progression or intolerance. -Will continue to monitor with clinic visits every 3 weeks and scans every 3-4 months -Recommend pt splint right wrist and keep it elevated. Swelling most likely due to Carpal tunnel. -Recommend pt drink at least 48-64 oz of water per day. Advised pt okay to temporarily increase sodium intake due to low BP (137/59) -Will refer to Dr. Enrique Sack for dental evaluation of abscessed tooth -Rx antibiotics (Augmentin) -Will see back in 3 weeks with labs   FOLLOW UP: -Referral to Dr Enrique Sack for rt lower jaw abscessed tooth for possible dental extraction -f/u as per currently scheduled appointment for next rx, portflush, labs and MD  visit in 3 weeks   The total time spent in the appt was  30 minutes and more than 50% was on counseling and direct patient cares.  All of the patient's questions were answered with apparent satisfaction. The patient knows to call the clinic with any problems, questions or concerns.   Sullivan Lone MD [place] AAHIVMS [facility] Acute [facility] Hematology/Oncology Physician [facility]  (Office):       [phone] (Work cell):  [phone] (Fax):           [phone]  08/15/2019 4:20 PM  I, Yevette Edwards, am acting as a scribe for Dr. Sullivan Lone.   .I have reviewed the above documentation for accuracy and completeness, and I agree with the above. Brunetta Genera MD

## 2019-08-15 ENCOUNTER — Other Ambulatory Visit: Payer: Self-pay

## 2019-08-15 ENCOUNTER — Inpatient Hospital Stay: Payer: Medicaid Other | Attending: Hematology

## 2019-08-15 ENCOUNTER — Inpatient Hospital Stay (HOSPITAL_BASED_OUTPATIENT_CLINIC_OR_DEPARTMENT_OTHER): Payer: Medicaid Other | Admitting: Hematology

## 2019-08-15 ENCOUNTER — Inpatient Hospital Stay: Payer: Medicaid Other

## 2019-08-15 VITALS — BP 137/59 | HR 84 | Temp 98.2°F | Resp 20 | Ht 71.0 in | Wt 137.6 lb

## 2019-08-15 DIAGNOSIS — I82621 Acute embolism and thrombosis of deep veins of right upper extremity: Secondary | ICD-10-CM

## 2019-08-15 DIAGNOSIS — K047 Periapical abscess without sinus: Secondary | ICD-10-CM | POA: Insufficient documentation

## 2019-08-15 DIAGNOSIS — D649 Anemia, unspecified: Secondary | ICD-10-CM | POA: Diagnosis not present

## 2019-08-15 DIAGNOSIS — Z5111 Encounter for antineoplastic chemotherapy: Secondary | ICD-10-CM | POA: Diagnosis not present

## 2019-08-15 DIAGNOSIS — Z7189 Other specified counseling: Secondary | ICD-10-CM

## 2019-08-15 DIAGNOSIS — K219 Gastro-esophageal reflux disease without esophagitis: Secondary | ICD-10-CM | POA: Insufficient documentation

## 2019-08-15 DIAGNOSIS — Z86718 Personal history of other venous thrombosis and embolism: Secondary | ICD-10-CM | POA: Diagnosis not present

## 2019-08-15 DIAGNOSIS — Z8042 Family history of malignant neoplasm of prostate: Secondary | ICD-10-CM | POA: Insufficient documentation

## 2019-08-15 DIAGNOSIS — Z5112 Encounter for antineoplastic immunotherapy: Secondary | ICD-10-CM | POA: Insufficient documentation

## 2019-08-15 DIAGNOSIS — C3411 Malignant neoplasm of upper lobe, right bronchus or lung: Secondary | ICD-10-CM | POA: Diagnosis present

## 2019-08-15 DIAGNOSIS — Z87891 Personal history of nicotine dependence: Secondary | ICD-10-CM | POA: Insufficient documentation

## 2019-08-15 DIAGNOSIS — Z79899 Other long term (current) drug therapy: Secondary | ICD-10-CM | POA: Insufficient documentation

## 2019-08-15 DIAGNOSIS — C3491 Malignant neoplasm of unspecified part of right bronchus or lung: Secondary | ICD-10-CM | POA: Diagnosis not present

## 2019-08-15 DIAGNOSIS — Z95828 Presence of other vascular implants and grafts: Secondary | ICD-10-CM

## 2019-08-15 LAB — CMP (CANCER CENTER ONLY)
ALT: 17 U/L (ref 0–44)
AST: 16 U/L (ref 15–41)
Albumin: 3.7 g/dL (ref 3.5–5.0)
Alkaline Phosphatase: 133 U/L — ABNORMAL HIGH (ref 38–126)
Anion gap: 8 (ref 5–15)
BUN: 9 mg/dL (ref 6–20)
CO2: 25 mmol/L (ref 22–32)
Calcium: 9.3 mg/dL (ref 8.9–10.3)
Chloride: 108 mmol/L (ref 98–111)
Creatinine: 0.78 mg/dL (ref 0.61–1.24)
GFR, Est AFR Am: >60 mL/min (ref >60)
GFR, Estimated: >60 mL/min (ref >60)
Glucose, Bld: 92 mg/dL (ref 70–99)
Potassium: 4.1 mmol/L (ref 3.5–5.1)
Sodium: 141 mmol/L (ref 135–145)
Total Bilirubin: 0.3 mg/dL (ref 0.3–1.2)
Total Protein: 7 g/dL (ref 6.5–8.1)

## 2019-08-15 LAB — CBC WITH DIFFERENTIAL/PLATELET
Abs Immature Granulocytes: 0 10*3/uL (ref 0.00–0.07)
Basophils Absolute: 0 10*3/uL (ref 0.0–0.1)
Basophils Relative: 1 %
Eosinophils Absolute: 0.1 10*3/uL (ref 0.0–0.5)
Eosinophils Relative: 1 %
HCT: 36.4 % — ABNORMAL LOW (ref 39.0–52.0)
Hemoglobin: 11.5 g/dL — ABNORMAL LOW (ref 13.0–17.0)
Immature Granulocytes: 0 %
Lymphocytes Relative: 15 %
Lymphs Abs: 0.6 10*3/uL — ABNORMAL LOW (ref 0.7–4.0)
MCH: 30 pg (ref 26.0–34.0)
MCHC: 31.6 g/dL (ref 30.0–36.0)
MCV: 95 fL (ref 80.0–100.0)
Monocytes Absolute: 0.6 10*3/uL (ref 0.1–1.0)
Monocytes Relative: 16 %
Neutro Abs: 2.7 10*3/uL (ref 1.7–7.7)
Neutrophils Relative %: 67 %
Platelets: 245 10*3/uL (ref 150–400)
RBC: 3.83 MIL/uL — ABNORMAL LOW (ref 4.22–5.81)
RDW: 14.5 % (ref 11.5–15.5)
WBC: 4.1 10*3/uL (ref 4.0–10.5)
nRBC: 0 % (ref 0.0–0.2)

## 2019-08-15 LAB — MAGNESIUM: Magnesium: 1.9 mg/dL (ref 1.7–2.4)

## 2019-08-15 LAB — TSH: TSH: 0.296 u[IU]/mL — ABNORMAL LOW (ref 0.320–4.118)

## 2019-08-15 LAB — T4, FREE: Free T4: 0.89 ng/dL (ref 0.61–1.12)

## 2019-08-15 MED ORDER — FAMOTIDINE 20 MG PO TABS
ORAL_TABLET | ORAL | Status: AC
  Filled 2019-08-15: qty 1

## 2019-08-15 MED ORDER — AMOXICILLIN-POT CLAVULANATE 875-125 MG PO TABS: 1.0000 | ORAL_TABLET | Freq: Two times a day (BID) | ORAL | 0 refills | Status: DC

## 2019-08-15 MED ORDER — SODIUM CHLORIDE 0.9 % IV SOLN
Freq: Once | INTRAVENOUS | Status: AC
  Filled 2019-08-15: qty 250

## 2019-08-15 MED ORDER — HEPARIN SOD (PORK) LOCK FLUSH 100 UNIT/ML IV SOLN
500.0000 [IU] | Freq: Once | INTRAVENOUS | Status: AC | PRN
  Administered 2019-08-15: 500 [IU]
  Filled 2019-08-15: qty 5

## 2019-08-15 MED ORDER — FAMOTIDINE 20 MG PO TABS
20.0000 mg | ORAL_TABLET | Freq: Once | ORAL | Status: AC
  Administered 2019-08-15: 12:00:00 20 mg via ORAL

## 2019-08-15 MED ORDER — SODIUM CHLORIDE 0.9% FLUSH
10.0000 mL | INTRAVENOUS | Status: DC | PRN
  Administered 2019-08-15: 13:00:00 10 mL
  Filled 2019-08-15: qty 10

## 2019-08-15 MED ORDER — ACETAMINOPHEN 325 MG PO TABS
ORAL_TABLET | ORAL | Status: AC
  Filled 2019-08-15: qty 2

## 2019-08-15 MED ORDER — DIPHENHYDRAMINE HCL 25 MG PO CAPS
ORAL_CAPSULE | ORAL | Status: AC
  Filled 2019-08-15: qty 1

## 2019-08-15 MED ORDER — ACETAMINOPHEN 325 MG PO TABS
650.0000 mg | ORAL_TABLET | Freq: Once | ORAL | Status: AC
  Administered 2019-08-15: 12:00:00 650 mg via ORAL

## 2019-08-15 MED ORDER — SODIUM CHLORIDE 0.9 % IV SOLN
1200.0000 mg | Freq: Once | INTRAVENOUS | Status: AC
  Administered 2019-08-15: 12:00:00 1200 mg via INTRAVENOUS
  Filled 2019-08-15: qty 20

## 2019-08-15 MED ORDER — DIPHENHYDRAMINE HCL 25 MG PO TABS
25.0000 mg | ORAL_TABLET | Freq: Once | ORAL | Status: AC
  Administered 2019-08-15: 12:00:00 25 mg via ORAL
  Filled 2019-08-15: qty 1

## 2019-08-15 MED ORDER — SODIUM CHLORIDE 0.9% FLUSH
10.0000 mL | INTRAVENOUS | Status: DC | PRN
  Administered 2019-08-15: 10:00:00 10 mL
  Filled 2019-08-15: qty 10

## 2019-08-15 NOTE — Patient Instructions (Signed)
[facility] Discharge Instructions for Patients Receiving Immunotherapy and Chemotherapy  Today you received the following immunotherapy and chemotherapy agents: Atezolizumab (Tencentriq)  To help prevent nausea and vomiting after your treatment, we encourage you to take your nausea medication as directed by your provider.   If you develop nausea and vomiting that is not controlled by your nausea medication, call the clinic.   BELOW ARE SYMPTOMS THAT SHOULD BE REPORTED IMMEDIATELY:  *FEVER GREATER THAN 100.5 F  *CHILLS WITH OR WITHOUT FEVER  NAUSEA AND VOMITING THAT IS NOT CONTROLLED WITH YOUR NAUSEA MEDICATION  *UNUSUAL SHORTNESS OF BREATH  *UNUSUAL BRUISING OR BLEEDING  TENDERNESS IN MOUTH AND THROAT WITH OR WITHOUT PRESENCE OF ULCERS  *URINARY PROBLEMS  *BOWEL PROBLEMS  UNUSUAL RASH Items with * indicate a potential emergency and should be followed up as soon as possible.  Feel free to call the clinic should you have any questions or concerns. The clinic phone number is [phone].  Please show the [facility] at check-in to the Emergency Department and triage nurse.  Coronavirus (COVID-19) Are you at risk?  Are you at risk for the Coronavirus (COVID-19)?  To be considered HIGH RISK for Coronavirus (COVID-19), you have to meet the following criteria:  . Traveled to [place], [place], [place], [place] or [place]; or in the [place] to [place], [place], [place], or [place]; and have fever, cough, and shortness of breath within the last 2 weeks of travel OR . Been in close contact with a person diagnosed with COVID-19 within the last 2 weeks and have fever, cough, and shortness of breath . IF YOU DO NOT MEET THESE CRITERIA, YOU ARE CONSIDERED LOW RISK FOR COVID-19.  What to do if you are HIGH RISK for COVID-19?  [name] If you are having a medical emergency, call 911. . Seek medical care right away. Before you go to a doctor's office, urgent  care or emergency department, call ahead and tell them about your recent travel, contact with someone diagnosed with COVID-19, and your symptoms. You should receive instructions from your physician's office regarding next steps of care.  . When you arrive at healthcare provider, tell the healthcare staff immediately you have returned from visiting [place], [place], [place], [place] or [place]; or traveled in the [place] to [place], [place], [place], or [place]; in the last two weeks or you have been in close contact with a person diagnosed with COVID-19 in the last 2 weeks.   . Tell the health care staff about your symptoms: fever, cough and shortness of breath. . After you have been seen by a medical provider, you will be either: o Tested for (COVID-19) and discharged home on quarantine except to seek medical care if symptoms worsen, and asked to  - Stay home and avoid contact with others until you get your results (4-5 days)  - Avoid travel on public transportation if possible (such as bus, train, or airplane) or o Sent to the Emergency Department by EMS for evaluation, COVID-19 testing, and possible admission depending on your condition and test results.  What to do if you are LOW RISK for COVID-19?  Reduce your risk of any infection by using the same precautions used for avoiding the common cold or flu:  [name] Wash your hands often with soap and warm water for at least 20 seconds.  If soap and water are not readily available, use an alcohol-based hand sanitizer with at least  60% alcohol.  . If coughing or sneezing, cover your mouth and nose by coughing or sneezing into the elbow areas of your shirt or coat, into a tissue or into your sleeve (not your hands). . Avoid shaking hands with others and consider head nods or verbal greetings only. . Avoid touching your eyes, nose, or mouth with unwashed hands.  . Avoid close contact with people who are sick. . Avoid places or events with large  numbers of people in one location, like concerts or sporting events. . Carefully consider travel plans you have or are making. . If you are planning any travel outside or inside the [place], visit the CDC's Travelers' Health webpage for the latest health notices. . If you have some symptoms but not all symptoms, continue to monitor at home and seek medical attention if your symptoms worsen. . If you are having a medical emergency, call 911.   [place] / e-Visit: [email or web address]         [facility]: [facility]: [phone]                   [facility]: [phone]

## 2019-08-22 ENCOUNTER — Other Ambulatory Visit (HOSPITAL_COMMUNITY): Payer: Medicaid Other | Admitting: Dentistry

## 2019-08-22 ENCOUNTER — Telehealth (HOSPITAL_COMMUNITY): Payer: Self-pay | Admitting: Dentistry

## 2019-08-22 NOTE — Telephone Encounter (Signed)
08/22/2019     Patient:            David Berry. Date of Birth:  10-17-1965 MRN:                [id number]   Patient arrived 15 minutes late for dental consultation appointment.  Patient indicated that he was reluctant to proceed with dental consultation if Dental Medicine would be unable to provide dental extraction procedure today. Patient was informed that he needed to be evaluated and then most likely referred to an oral surgeon for the dental extraction procedure secondary to his complex medical problems.  Patient had been provided the same information yesterday when the appointment was scheduled.  The patient then indicated he would follow-up with the primary dentist of his choice for evaluation and treatment as indicated.   Lenn Cal, DDS

## 2019-08-24 ENCOUNTER — Telehealth: Payer: Self-pay | Admitting: Hematology

## 2019-08-24 NOTE — Telephone Encounter (Signed)
Scheduled per 04/07 los, patient has been called and notified.

## 2019-08-30 NOTE — Progress Notes (Signed)
Pharmacist Chemotherapy Monitoring - Follow Up Assessment    I verify that I have reviewed each item in the below checklist:  . Regimen for the patient is scheduled for the appropriate day and plan matches scheduled date. [name] Appropriate non-routine labs are ordered dependent on drug ordered. . If applicable, additional medications reviewed and ordered per protocol based on lifetime cumulative doses and/or treatment regimen.   Plan for follow-up and/or issues identified: No . I-vent associated with next due treatment: No . MD and/or nursing notified: No  Philomena Course 08/30/2019 8:23 AM

## 2019-09-04 DIAGNOSIS — H5213 Myopia, bilateral: Secondary | ICD-10-CM | POA: Diagnosis not present

## 2019-09-05 ENCOUNTER — Inpatient Hospital Stay (HOSPITAL_BASED_OUTPATIENT_CLINIC_OR_DEPARTMENT_OTHER): Payer: Medicaid Other | Admitting: Hematology

## 2019-09-05 ENCOUNTER — Other Ambulatory Visit: Payer: Self-pay

## 2019-09-05 ENCOUNTER — Inpatient Hospital Stay: Payer: Medicaid Other

## 2019-09-05 VITALS — BP 107/73 | HR 78 | Temp 98.5°F | Resp 18 | Ht 71.0 in | Wt 137.1 lb

## 2019-09-05 DIAGNOSIS — C3491 Malignant neoplasm of unspecified part of right bronchus or lung: Secondary | ICD-10-CM

## 2019-09-05 DIAGNOSIS — Z7189 Other specified counseling: Secondary | ICD-10-CM

## 2019-09-05 DIAGNOSIS — I82621 Acute embolism and thrombosis of deep veins of right upper extremity: Secondary | ICD-10-CM | POA: Diagnosis not present

## 2019-09-05 DIAGNOSIS — Z5111 Encounter for antineoplastic chemotherapy: Secondary | ICD-10-CM | POA: Diagnosis not present

## 2019-09-05 DIAGNOSIS — Z5112 Encounter for antineoplastic immunotherapy: Secondary | ICD-10-CM | POA: Diagnosis not present

## 2019-09-05 LAB — CMP (CANCER CENTER ONLY)
ALT: 15 U/L (ref 0–44)
AST: 15 U/L (ref 15–41)
Albumin: 3.7 g/dL (ref 3.5–5.0)
Alkaline Phosphatase: 140 U/L — ABNORMAL HIGH (ref 38–126)
Anion gap: 6 (ref 5–15)
BUN: 6 mg/dL (ref 6–20)
CO2: 28 mmol/L (ref 22–32)
Calcium: 9.4 mg/dL (ref 8.9–10.3)
Chloride: 105 mmol/L (ref 98–111)
Creatinine: 0.76 mg/dL (ref 0.61–1.24)
GFR, Est AFR Am: >60 mL/min (ref >60)
GFR, Estimated: >60 mL/min (ref >60)
Glucose, Bld: 99 mg/dL (ref 70–99)
Potassium: 4.3 mmol/L (ref 3.5–5.1)
Sodium: 139 mmol/L (ref 135–145)
Total Bilirubin: 0.3 mg/dL (ref 0.3–1.2)
Total Protein: 7.1 g/dL (ref 6.5–8.1)

## 2019-09-05 LAB — CBC WITH DIFFERENTIAL/PLATELET
Abs Immature Granulocytes: 0.01 10*3/uL (ref 0.00–0.07)
Basophils Absolute: 0 10*3/uL (ref 0.0–0.1)
Basophils Relative: 1 %
Eosinophils Absolute: 0.1 10*3/uL (ref 0.0–0.5)
Eosinophils Relative: 2 %
HCT: 38.8 % — ABNORMAL LOW (ref 39.0–52.0)
Hemoglobin: 12.2 g/dL — ABNORMAL LOW (ref 13.0–17.0)
Immature Granulocytes: 0 %
Lymphocytes Relative: 15 %
Lymphs Abs: 0.6 10*3/uL — ABNORMAL LOW (ref 0.7–4.0)
MCH: 29.2 pg (ref 26.0–34.0)
MCHC: 31.4 g/dL (ref 30.0–36.0)
MCV: 92.8 fL (ref 80.0–100.0)
Monocytes Absolute: 0.6 10*3/uL (ref 0.1–1.0)
Monocytes Relative: 16 %
Neutro Abs: 2.5 10*3/uL (ref 1.7–7.7)
Neutrophils Relative %: 66 %
Platelets: 299 10*3/uL (ref 150–400)
RBC: 4.18 MIL/uL — ABNORMAL LOW (ref 4.22–5.81)
RDW: 13.4 % (ref 11.5–15.5)
WBC: 3.8 10*3/uL — ABNORMAL LOW (ref 4.0–10.5)
nRBC: 0 % (ref 0.0–0.2)

## 2019-09-05 LAB — MAGNESIUM: Magnesium: 2 mg/dL (ref 1.7–2.4)

## 2019-09-05 MED ORDER — ACETAMINOPHEN 325 MG PO TABS
650.0000 mg | ORAL_TABLET | Freq: Once | ORAL | Status: AC
  Administered 2019-09-05: 650 mg via ORAL

## 2019-09-05 MED ORDER — SODIUM CHLORIDE 0.9 % IV SOLN
Freq: Once | INTRAVENOUS | Status: AC
  Filled 2019-09-05: qty 250

## 2019-09-05 MED ORDER — FAMOTIDINE 20 MG PO TABS
20.0000 mg | ORAL_TABLET | Freq: Once | ORAL | Status: AC
  Administered 2019-09-05: 20 mg via ORAL

## 2019-09-05 MED ORDER — SODIUM CHLORIDE 0.9 % IV SOLN
1200.0000 mg | Freq: Once | INTRAVENOUS | Status: AC
  Administered 2019-09-05: 1200 mg via INTRAVENOUS
  Filled 2019-09-05: qty 20

## 2019-09-05 MED ORDER — DIPHENHYDRAMINE HCL 25 MG PO TABS
25.0000 mg | ORAL_TABLET | Freq: Once | ORAL | Status: AC
  Administered 2019-09-05: 25 mg via ORAL
  Filled 2019-09-05: qty 1

## 2019-09-05 MED ORDER — ACETAMINOPHEN 325 MG PO TABS
ORAL_TABLET | ORAL | Status: AC
  Filled 2019-09-05: qty 2

## 2019-09-05 MED ORDER — DIPHENHYDRAMINE HCL 25 MG PO CAPS
ORAL_CAPSULE | ORAL | Status: AC
  Filled 2019-09-05: qty 1

## 2019-09-05 MED ORDER — SODIUM CHLORIDE 0.9% FLUSH
10.0000 mL | INTRAVENOUS | Status: DC | PRN
  Administered 2019-09-05: 10 mL
  Filled 2019-09-05: qty 10

## 2019-09-05 MED ORDER — HEPARIN SOD (PORK) LOCK FLUSH 100 UNIT/ML IV SOLN
500.0000 [IU] | Freq: Once | INTRAVENOUS | Status: AC | PRN
  Administered 2019-09-05: 500 [IU]
  Filled 2019-09-05: qty 5

## 2019-09-05 MED ORDER — FAMOTIDINE 20 MG PO TABS
ORAL_TABLET | ORAL | Status: AC
  Filled 2019-09-05: qty 1

## 2019-09-05 NOTE — Progress Notes (Signed)
HEMATOLOGY/ONCOLOGY CLINIC NOTE  Date of Service: 09/05/2019  Patient Care Team: [name], MD as PCP - General (Family Medicine)  CHIEF COMPLAINTS/PURPOSE OF CONSULTATION:  contnued management of Metastatic Poorly differentiated lung adenocarcinoma  HISTORY OF PRESENTING ILLNESS:   David Berry is a 54 year old male with no significant medical history.  He presented to the hospital with progressive right arm and chest pain with new right-sided neck swelling.  The patient states that he was diagnosed with carpal tunnel syndrome received an injection in August.  However, he continued to have progressive pain up and down his right arm that would also radiate to his right chest.  He took ibuprofen with no improvement.  1 week prior to admission, he developed new right sided neck swelling.  He presented to the emergency room for further evaluation.  Work-up in the emergency room was significant for a CT angiogram of the neck and chest which revealed a bulky soft tissue mass in the right neck continuing into the upper chest most compatible with extensive metastatic lymphadenopathy.  This mass measures up to 34 mm and encases multiple vessels including the right upper lobe pulmonary vessels, superior vena cava, and brachiocephalic vessel.  He also had a large poorly defined malignant appearing infiltrative mass measuring 6.2 x 5.6 x 7.9 cm within the right anterior and middle mediastinum with suspected invasion of the distal left brachiocephalic vein and probable tumor occlusion of the right subclavian and jugular veins.  There is also suspected tumor invasion of the upper SVC with string-like narrowing of the SVC but with patency of the SVC just above the right atrium.  The tumor abuts the aorta and great vessels and also results in significant narrowing of the right upper lobe pulmonary arterial vessels.  He also had a CT of the abdomen pelvis which showed a 15 mm heterogeneously enhancing lesion  in the upper pole of the right kidney concerning for renal cell carcinoma, no lymphadenopathy in the abdomen or pelvis, focal hyperenhancement of the anterior left liver most likely related to aberrant venous anatomy/flow, sclerotic lesions in the right acetabulum and left iliac bone are indeterminate.  These are likely bone islands but attention on follow-up is recommended a metastatic disease cannot be excluded.  MRI of the brain with and without contrast did not show any evidence of intracranial metastatic disease. The patient underwent a CT-guided biopsy in interventional radiology on 01/22/2019.  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   When seen today, patient reports ongoing pain and swelling to his right arm and right side of his neck.  Reports ongoing pain to his right chest.  He reports intermittent facial swelling which is worse in the morning and improves throughout the day.  He reports anorexia and a weight loss of 20 pounds over the past few weeks. He has had dizziness for the past few months.  Denies headaches.  He also noted that on the day of admission his peripheral vision was poor.  His vision is now improved.  He denies food getting stuck or difficulty swallowing but does feel as though his throat is "tight."  He reports a nonproductive cough.  Denies fevers and chills.  He is not really having any shortness of breath.  Denies abdominal pain, nausea, vomiting, constipation, diarrhea.  He has not noticed any epistaxis, hemoptysis, hematemesis, hematuria, melena, hematochezia.  The patient is single.  He has 1 daughter who lives  in [place], [place].  Reports occasional alcohol use and currently smokes 3 cigars a day since 1997.  He is currently living in a rooming house with other roommates who all have their own room.  They share a common bathroom and kitchen.  Medical oncology was asked see the  patient to make recommendations regarding his newly diagnosed poorly differentiated carcinoma.  History reviewed. No pertinent past medical history.   Current Treatment:  Atezolizumab + carbo/taxol  INTERVAL HISTORY:   David Berry. is a  54 y.o. male who is here for evaluation and management of metastatic poorly differentiated lung adenocarcinoma. He is here for C9 of Atezolizumab. The patient's last visit with [place] was on 08/15/2019. The pt reports that he is doing well overall.  The pt reports that he has been well and has had no new concerns in the interim. He has no SOB, but can feel phlegm in his lungs. Pt does have significant seasonal allergies.   Lab results today (09/05/19) of CBC w/diff and CMP is as follows: all values are WNL except for WBC at 3.8K, RBC at 4.18, Hgb at 12.2, HCT at 38.8, Lymphs Abs at 0.6K, ALP at 140. 09/05/2019 Magnesium at 2.0  On review of systems, pt denies rash, mouth sores, diarrhea, fevers, chills, SOB, coughing, sleeplessness, low appetite, abdominal pain, constipation, leg swelling, upper extremity swelling and any other symptoms.   MEDICAL HISTORY:  Past Medical History:  Diagnosis Date  . Blood in stool   . Cancer ([place])   . Chronic bronchitis with emphysema ([place])   . GERD (gastroesophageal reflux disease)   . Lung cancer [facility])     SURGICAL HISTORY: Past Surgical History:  Procedure Laterality Date  . APPENDECTOMY     teenager  . INGUINAL HERNIA REPAIR Right 2016, 2017   [facility], 2018 [facility] mesh  . IR IMAGING GUIDED PORT INSERTION  04/26/2019    SOCIAL HISTORY: Social History   Socioeconomic History  . Marital status: Single    Spouse name: Not on file  . Number of children: 1  . Years of education: GED  . Highest education level: Not on file  Occupational History    Comment: fork lift driver  Tobacco Use  . Smoking status: Former Smoker    Types: Cigars  . Smokeless tobacco: Never Used  . Tobacco  comment: smokes 3 black and mild cigars per day  Substance and Sexual Activity  . Alcohol use: Not Currently  . Drug use: No  . Sexual activity: Yes  Other Topics Concern  . Not on file  Social History Narrative   Lives alone   Caffeine- coffee, 20 oz daily, tea occas   Social Determinants of Health   Financial Resource Strain:   . Difficulty of Paying Living Expenses:   Food Insecurity:   . Worried About Charity fundraiser in the Last Year:   . Arboriculturist in the Last Year:   Transportation Needs:   . Film/video editor (Medical):   [name] Lack of Transportation (Non-Medical):   Physical Activity:   . Days of Exercise per Week:   . Minutes of Exercise per Session:   Stress:   . Feeling of Stress :   Social Connections:   . Frequency of Communication with Friends and Family:   . Frequency of Social Gatherings with Friends and Family:   . Attends Religious Services:   . Active Member of Clubs or Organizations:   .  Attends Archivist Meetings:   [name] Marital Status:   Intimate Partner Violence:   . Fear of Current or Ex-Partner:   . Emotionally Abused:   [name] Physically Abused:   . Sexually Abused:     FAMILY HISTORY: Family History  Problem Relation Age of Onset  . Prostate cancer Father     ALLERGIES:  is allergic to pregabalin.  MEDICATIONS:  Current Outpatient Medications  Medication Sig Dispense Refill  . amoxicillin-clavulanate (AUGMENTIN) 875-125 MG tablet Take 1 tablet by mouth 2 (two) times daily. 20 tablet 0  . apixaban (ELIQUIS) 5 MG TABS tablet Take 1 tablet (5 mg total) by mouth 2 (two) times daily. 60 tablet 5  . LORazepam (ATIVAN) 0.5 MG tablet Take 1 tablet (0.5 mg total) by mouth every 4 (four) hours as needed for anxiety. 60 tablet 0  . omeprazole (PRILOSEC) 40 MG capsule Take 1 capsule (40 mg total) by mouth 2 (two) times daily. 60 capsule 3  . oxyCODONE (OXY IR/ROXICODONE) 5 MG immediate release tablet Take 1-2 tablets (5-10 mg total)  by mouth every 4 (four) hours as needed for severe pain. 60 tablet 0   No current facility-administered medications for this visit.   Facility-Administered Medications Ordered in Other Visits  Medication Dose Route Frequency Provider Last Rate Last Admin  . acetaminophen (TYLENOL) tablet 650 mg  650 mg Oral Once Brunetta Genera, MD      . [facility] Bienenstock [facility]) 1,200 mg in sodium chloride 0.9 % 250 mL chemo infusion  1,200 mg Intravenous Once Brunetta Genera, MD      . diphenhydrAMINE (BENADRYL) tablet 25 mg  25 mg Oral Once Brunetta Genera, MD      . famotidine (PEPCID) tablet 20 mg  20 mg Oral Once Brunetta Genera, MD      . heparin lock flush 100 unit/mL  500 Units Intracatheter Once PRN Brunetta Genera, MD      . sodium chloride flush (NS) 0.9 % injection 10 mL  10 mL Intracatheter PRN Brunetta Genera, MD        REVIEW OF SYSTEMS:   A 10+ POINT REVIEW OF SYSTEMS WAS OBTAINED including neurology, dermatology, psychiatry, cardiac, respiratory, lymph, extremities, GI, GU, Musculoskeletal, constitutional, breasts, reproductive, HEENT.  All pertinent positives are noted in the HPI.  All others are negative.   PHYSICAL EXAMINATION: ECOG FS:1 - Symptomatic but completely ambulatory  Vitals:   09/05/19 1047  BP: 107/73  Pulse: 78  Resp: 18  Temp: 98.5 F (36.9 C)  SpO2: 100%   Wt Readings from Last 3 Encounters:  09/05/19 137 lb 1.6 oz (62.2 kg)  08/15/19 137 lb 9.6 oz (62.4 kg)  07/25/19 135 lb 11.2 oz (61.6 kg)   Body mass index is 19.12 kg/m.    GENERAL:alert, in no acute distress and comfortable SKIN: no acute rashes, no significant lesions EYES: conjunctiva are pink and non-injected, sclera anicteric OROPHARYNX: MMM, no exudates, no oropharyngeal erythema or ulceration NECK: supple, no JVD LYMPH:  no palpable lymphadenopathy in the cervical, axillary or inguinal regions LUNGS: clear to auscultation b/l with normal respiratory effort HEART:  regular rate & rhythm ABDOMEN:  normoactive bowel sounds , non tender, not distended. No palpable hepatosplenomegaly.  Extremity: no pedal edema PSYCH: alert & oriented x 3 with fluent speech NEURO: no focal motor/sensory deficits  LABORATORY DATA:  I have reviewed the data as listed  . CBC Latest Ref Rng & Units 09/05/2019 08/15/2019 07/25/2019  WBC  4.0 - 10.5 K/uL 3.8(L) 4.1 4.1  Hemoglobin 13.0 - 17.0 g/dL 12.2(L) 11.5(L) 11.0(L)  Hematocrit 39.0 - 52.0 % 38.8(L) 36.4(L) 34.7(L)  Platelets 150 - 400 K/uL 299 245 291    . CMP Latest Ref Rng & Units 09/05/2019 08/15/2019 07/25/2019  Glucose 70 - 99 mg/dL 99 92 100(H)  BUN 6 - 20 mg/dL 6 9 7  Creatinine 0.61 - 1.24 mg/dL 0.76 0.78 0.77  Sodium 135 - 145 mmol/L 139 141 142  Potassium 3.5 - 5.1 mmol/L 4.3 4.1 3.9  Chloride 98 - 111 mmol/L 105 108 107  CO2 22 - 32 mmol/L 28 25 26  Calcium 8.9 - 10.3 mg/dL 9.4 9.3 9.1  Total Protein 6.5 - 8.1 g/dL 7.1 7.0 6.7  Total Bilirubin 0.3 - 1.2 mg/dL 0.3 0.3 <0.2(L)  Alkaline Phos 38 - 126 U/L 140(H) 133(H) 128(H)  AST 15 - 41 U/L 15 16 12(L)  ALT 0 - 44 U/L 15 17 12    01/22/2019 [facility]: Tumor Mutational Burden     01/22/2019 PD-L1 Immunohistochemistry Analysis    01/22/2019 Soft Tissue Needle Core Biopsy Surgical Pathology     RADIOGRAPHIC STUDIES: I have personally reviewed the radiological images as listed and agreed with the findings in the report. No results found.  ASSESSMENT & PLAN:   This is a 53-year-old malewith  1. Newly diagnosed Metastatic Poorly differentiated lung adenocarcinoma Presented with Large neck mass, mediastinal mass, renal mass, and questionable bone lesions no brain mets on MRI brain 01/22/2019 PD-L1 Immunohistochemistry Analysis which revealed "Tumor Proportion Score (TPS) 50%" 05/16/2019 PET/CT ([id number]) revealed "Radiation changes in the right hemithorax, as above. Improving mediastinal nodal metastases. Prior bulky right cervical  metastases have resolved. No evidence of metastatic disease in the abdomen/pelvis." 07/19/2019 Esophagus Scan ([id number]) revealed "Mild stricture at the C6-7 level likely related to prior esophageal surgery. Barium tablet was slow to pass through this area but did pass through after 1 minutes. Hiatal hernia with moderate gastroesophageal reflux. Moderate stricture above the hiatal hernia likely due to reflux. Barium tablet did not pass this area. 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 Acute DVT- Right IJ, Right Innominate Vein, and likely also the SVC- related to malignancy+ tobacco-  on long term Eliquis  4. Symptomatic hemorrhoids  5. Normocytic anemia -Likely due to underlying malignancy -Will check ferritin, iron studies, vitamin B12 level, and RBC folate in the a.m.  6.Thrombocytosis -- likely due to paraneoplastic effect of tumor and reactive due to inflammation and tissue inflammation from RT.-- now resolved.  7. Nicotine dependence -He was counseled about tobacco cessation.  8. Dental abscess PLAN:  -Discussed pt labwork today, 09/05/19; WBC is okay, anemia is steady, PLT are nml, blood chemistries look good, Magnesium is WNL -Recommended that the pt continue to eat well, drink at least 48-64 oz of water each day, and walk 20-30 minutes each day.  -The pt has no prohibitive toxicities from continuing C9D1 of Atezolizumab at this time -Will continue Atezolizumab until progression or intolerance. -Due to stability of labs and symptoms will see pt with every other treatment  -Continued to recommend pt receive the COVID19 vaccine - pt continues to decline -Will repeat CT Chest in 5 weeks  -Refill Oxycodone  -Will see back in 6 weeks with labs   FOLLOW UP: Please schedule next 4 treatments with Atezolizumab q3weeks with   labs CT chest in 5 weeks MD visit in  6 weeks (Patient prefers early morning appointments for MD and treatment) Portflush with all labs    The total time spent in the appt was 30 minutes and more than 50% was on counseling and direct patient cares.  All of the patient's questions were answered with apparent satisfaction. The patient knows to call the clinic with any problems, questions or concerns.     MD MS AAHIVMS [facility] C[facility] Hematology/Oncology Physician [facility]  (Office):       [phone] (Work cell):  [phone] (Fax):           [phone]  09/05/2019 11:34 AM  I, Jazzmine Knight, am acting as a scribe for Dr.  .   .I have reviewed the above documentation for accuracy and completeness, and I agree with the above. . Kishore  MD                 

## 2019-09-06 ENCOUNTER — Other Ambulatory Visit: Payer: Self-pay | Admitting: Hematology

## 2019-09-07 ENCOUNTER — Telehealth: Payer: Self-pay | Admitting: Hematology

## 2019-09-07 ENCOUNTER — Other Ambulatory Visit: Payer: Self-pay

## 2019-09-07 NOTE — Progress Notes (Unsigned)
Pt. Requesting requesting refill for his oxycodone 5mg  IR

## 2019-09-07 NOTE — Telephone Encounter (Signed)
Scheduled per 04/28 los, patient has been called and notified. ?

## 2019-09-10 ENCOUNTER — Telehealth: Payer: Self-pay

## 2019-09-10 ENCOUNTER — Other Ambulatory Visit: Payer: Self-pay | Admitting: Hematology

## 2019-09-10 MED ORDER — OXYCODONE HCL 5 MG PO TABS: 5.0000 mg | ORAL_TABLET | ORAL | 0 refills | Status: DC | PRN

## 2019-09-10 NOTE — Telephone Encounter (Signed)
TCT pt. regarding VM requesting Oxycodone refill. Unable to reach pt. LVM that his refill has been sent to [facility] [address] and he can call office back with any questions or concerns.

## 2019-09-20 NOTE — Progress Notes (Signed)
Pharmacist Chemotherapy Monitoring - Follow Up Assessment    I verify that I have reviewed each item in the below checklist:  . Regimen for the patient is scheduled for the appropriate day and plan matches scheduled date. [name] Appropriate non-routine labs are ordered dependent on drug ordered. . If applicable, additional medications reviewed and ordered per protocol based on lifetime cumulative doses and/or treatment regimen.   Plan for follow-up and/or issues identified: No . I-vent associated with next due treatment: No . MD and/or nursing notified: No  David Berry 09/20/2019 11:25 AM

## 2019-09-26 ENCOUNTER — Inpatient Hospital Stay: Payer: Medicaid Other | Attending: Hematology

## 2019-09-26 ENCOUNTER — Ambulatory Visit: Payer: Medicaid Other | Admitting: Hematology

## 2019-09-26 ENCOUNTER — Inpatient Hospital Stay (HOSPITAL_BASED_OUTPATIENT_CLINIC_OR_DEPARTMENT_OTHER): Payer: Medicaid Other | Admitting: Hematology

## 2019-09-26 ENCOUNTER — Inpatient Hospital Stay: Payer: Medicaid Other

## 2019-09-26 ENCOUNTER — Other Ambulatory Visit: Payer: Self-pay

## 2019-09-26 ENCOUNTER — Other Ambulatory Visit: Payer: Medicaid Other

## 2019-09-26 VITALS — HR 86 | Temp 98.5°F | Resp 18 | Ht 71.0 in | Wt 137.2 lb

## 2019-09-26 DIAGNOSIS — Z87891 Personal history of nicotine dependence: Secondary | ICD-10-CM | POA: Diagnosis not present

## 2019-09-26 DIAGNOSIS — Z7901 Long term (current) use of anticoagulants: Secondary | ICD-10-CM | POA: Diagnosis not present

## 2019-09-26 DIAGNOSIS — Z7189 Other specified counseling: Secondary | ICD-10-CM

## 2019-09-26 DIAGNOSIS — Z5111 Encounter for antineoplastic chemotherapy: Secondary | ICD-10-CM | POA: Diagnosis not present

## 2019-09-26 DIAGNOSIS — C3491 Malignant neoplasm of unspecified part of right bronchus or lung: Secondary | ICD-10-CM

## 2019-09-26 DIAGNOSIS — D649 Anemia, unspecified: Secondary | ICD-10-CM | POA: Diagnosis not present

## 2019-09-26 DIAGNOSIS — Z86718 Personal history of other venous thrombosis and embolism: Secondary | ICD-10-CM | POA: Insufficient documentation

## 2019-09-26 DIAGNOSIS — C3411 Malignant neoplasm of upper lobe, right bronchus or lung: Secondary | ICD-10-CM | POA: Diagnosis present

## 2019-09-26 DIAGNOSIS — Z8042 Family history of malignant neoplasm of prostate: Secondary | ICD-10-CM | POA: Insufficient documentation

## 2019-09-26 DIAGNOSIS — Z5112 Encounter for antineoplastic immunotherapy: Secondary | ICD-10-CM | POA: Diagnosis not present

## 2019-09-26 DIAGNOSIS — I82621 Acute embolism and thrombosis of deep veins of right upper extremity: Secondary | ICD-10-CM

## 2019-09-26 DIAGNOSIS — Z79899 Other long term (current) drug therapy: Secondary | ICD-10-CM | POA: Insufficient documentation

## 2019-09-26 LAB — CMP (CANCER CENTER ONLY)
ALT: 9 U/L (ref 0–44)
AST: 13 U/L — ABNORMAL LOW (ref 15–41)
Albumin: 3.7 g/dL (ref 3.5–5.0)
Alkaline Phosphatase: 122 U/L (ref 38–126)
Anion gap: 8 (ref 5–15)
BUN: 7 mg/dL (ref 6–20)
CO2: 25 mmol/L (ref 22–32)
Calcium: 9.4 mg/dL (ref 8.9–10.3)
Chloride: 107 mmol/L (ref 98–111)
Creatinine: 0.85 mg/dL (ref 0.61–1.24)
GFR, Est AFR Am: >60 mL/min (ref >60)
GFR, Estimated: >60 mL/min (ref >60)
Glucose, Bld: 96 mg/dL (ref 70–99)
Potassium: 4 mmol/L (ref 3.5–5.1)
Sodium: 140 mmol/L (ref 135–145)
Total Bilirubin: 0.3 mg/dL (ref 0.3–1.2)
Total Protein: 7 g/dL (ref 6.5–8.1)

## 2019-09-26 LAB — CBC WITH DIFFERENTIAL/PLATELET
Abs Immature Granulocytes: 0 10*3/uL (ref 0.00–0.07)
Basophils Absolute: 0 10*3/uL (ref 0.0–0.1)
Basophils Relative: 1 %
Eosinophils Absolute: 0.1 10*3/uL (ref 0.0–0.5)
Eosinophils Relative: 1 %
HCT: 37.3 % — ABNORMAL LOW (ref 39.0–52.0)
Hemoglobin: 11.9 g/dL — ABNORMAL LOW (ref 13.0–17.0)
Immature Granulocytes: 0 %
Lymphocytes Relative: 20 %
Lymphs Abs: 0.7 10*3/uL (ref 0.7–4.0)
MCH: 28.5 pg (ref 26.0–34.0)
MCHC: 31.9 g/dL (ref 30.0–36.0)
MCV: 89.4 fL (ref 80.0–100.0)
Monocytes Absolute: 0.6 10*3/uL (ref 0.1–1.0)
Monocytes Relative: 16 %
Neutro Abs: 2.3 10*3/uL (ref 1.7–7.7)
Neutrophils Relative %: 62 %
Platelets: 273 10*3/uL (ref 150–400)
RBC: 4.17 MIL/uL — ABNORMAL LOW (ref 4.22–5.81)
RDW: 13.2 % (ref 11.5–15.5)
WBC: 3.8 10*3/uL — ABNORMAL LOW (ref 4.0–10.5)
nRBC: 0 % (ref 0.0–0.2)

## 2019-09-26 LAB — MAGNESIUM: Magnesium: 1.8 mg/dL (ref 1.7–2.4)

## 2019-09-26 MED ORDER — SODIUM CHLORIDE 0.9% FLUSH
10.0000 mL | INTRAVENOUS | Status: DC | PRN
  Administered 2019-09-26: 10 mL
  Filled 2019-09-26: qty 10

## 2019-09-26 MED ORDER — DIPHENHYDRAMINE HCL 25 MG PO TABS
25.0000 mg | ORAL_TABLET | Freq: Once | ORAL | Status: AC
  Administered 2019-09-26: 25 mg via ORAL
  Filled 2019-09-26: qty 1

## 2019-09-26 MED ORDER — ACETAMINOPHEN 325 MG PO TABS
650.0000 mg | ORAL_TABLET | Freq: Once | ORAL | Status: AC
  Administered 2019-09-26: 650 mg via ORAL

## 2019-09-26 MED ORDER — HEPARIN SOD (PORK) LOCK FLUSH 100 UNIT/ML IV SOLN
500.0000 [IU] | Freq: Once | INTRAVENOUS | Status: AC | PRN
  Administered 2019-09-26: 500 [IU]
  Filled 2019-09-26: qty 5

## 2019-09-26 MED ORDER — FAMOTIDINE 20 MG PO TABS
20.0000 mg | ORAL_TABLET | Freq: Once | ORAL | Status: AC
  Administered 2019-09-26: 20 mg via ORAL

## 2019-09-26 MED ORDER — DIPHENHYDRAMINE HCL 25 MG PO CAPS
ORAL_CAPSULE | ORAL | Status: AC
  Filled 2019-09-26: qty 1

## 2019-09-26 MED ORDER — SODIUM CHLORIDE 0.9 % IV SOLN
Freq: Once | INTRAVENOUS | Status: AC
  Filled 2019-09-26: qty 250

## 2019-09-26 MED ORDER — ACETAMINOPHEN 325 MG PO TABS
ORAL_TABLET | ORAL | Status: AC
  Filled 2019-09-26: qty 2

## 2019-09-26 MED ORDER — FAMOTIDINE 20 MG PO TABS
ORAL_TABLET | ORAL | Status: AC
  Filled 2019-09-26: qty 1

## 2019-09-26 MED ORDER — SODIUM CHLORIDE 0.9 % IV SOLN
1200.0000 mg | Freq: Once | INTRAVENOUS | Status: AC
  Administered 2019-09-26: 1200 mg via INTRAVENOUS
  Filled 2019-09-26: qty 20

## 2019-09-26 NOTE — Progress Notes (Signed)
HEMATOLOGY/ONCOLOGY CLINIC NOTE  Date of Service: 09/26/2019  Patient Care Team: [name], MD as PCP - General (Family Medicine)  CHIEF COMPLAINTS/PURPOSE OF CONSULTATION:  contnued management of Metastatic Poorly differentiated lung adenocarcinoma  HISTORY OF PRESENTING ILLNESS:   David Berry is a 54 year old male with no significant medical history.  He presented to the hospital with progressive right arm and chest pain with new right-sided neck swelling.  The patient states that he was diagnosed with carpal tunnel syndrome received an injection in August.  However, he continued to have progressive pain up and down his right arm that would also radiate to his right chest.  He took ibuprofen with no improvement.  1 week prior to admission, he developed new right sided neck swelling.  He presented to the emergency room for further evaluation.  Work-up in the emergency room was significant for a CT angiogram of the neck and chest which revealed a bulky soft tissue mass in the right neck continuing into the upper chest most compatible with extensive metastatic lymphadenopathy.  This mass measures up to 34 mm and encases multiple vessels including the right upper lobe pulmonary vessels, superior vena cava, and brachiocephalic vessel.  He also had a large poorly defined malignant appearing infiltrative mass measuring 6.2 x 5.6 x 7.9 cm within the right anterior and middle mediastinum with suspected invasion of the distal left brachiocephalic vein and probable tumor occlusion of the right subclavian and jugular veins.  There is also suspected tumor invasion of the upper SVC with string-like narrowing of the SVC but with patency of the SVC just above the right atrium.  The tumor abuts the aorta and great vessels and also results in significant narrowing of the right upper lobe pulmonary arterial vessels.  He also had a CT of the abdomen pelvis which showed a 15 mm heterogeneously enhancing lesion  in the upper pole of the right kidney concerning for renal cell carcinoma, no lymphadenopathy in the abdomen or pelvis, focal hyperenhancement of the anterior left liver most likely related to aberrant venous anatomy/flow, sclerotic lesions in the right acetabulum and left iliac bone are indeterminate.  These are likely bone islands but attention on follow-up is recommended a metastatic disease cannot be excluded.  MRI of the brain with and without contrast did not show any evidence of intracranial metastatic disease. The patient underwent a CT-guided biopsy in interventional radiology on 01/22/2019.  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   When seen today, patient reports ongoing pain and swelling to his right arm and right side of his neck.  Reports ongoing pain to his right chest.  He reports intermittent facial swelling which is worse in the morning and improves throughout the day.  He reports anorexia and a weight loss of 20 pounds over the past few weeks. He has had dizziness for the past few months.  Denies headaches.  He also noted that on the day of admission his peripheral vision was poor.  His vision is now improved.  He denies food getting stuck or difficulty swallowing but does feel as though his throat is "tight."  He reports a nonproductive cough.  Denies fevers and chills.  He is not really having any shortness of breath.  Denies abdominal pain, nausea, vomiting, constipation, diarrhea.  He has not noticed any epistaxis, hemoptysis, hematemesis, hematuria, melena, hematochezia.  The patient is single.  He has 1 daughter who lives  in [place], [place].  Reports occasional alcohol use and currently smokes 3 cigars a day since 1997.  He is currently living in a rooming house with other roommates who all have their own room.  They share a common bathroom and kitchen.  Medical oncology was asked see the  patient to make recommendations regarding his newly diagnosed poorly differentiated carcinoma.  History reviewed. No pertinent past medical history.   Current Treatment:  Atezolizumab maintenance   INTERVAL HISTORY:   David Umanzor. is a 54 y.o. male who is here for evaluation and management of metastatic poorly differentiated lung adenocarcinoma. He is here for C10 of Atezolizumab. The patient's last visit with [place] was on 09/05/2019. The pt reports that he is doing well overall.  The pt reports that he has been well and has no new concerns. He is interested in returning to work at this time.   Lab results today (09/26/19) of CBC w/diff and CMP is as follows: all values are WNL except for WBC at 3.8K, RBC at 4.17, Hgb at 11.9, HCT at 37.3, AST at 13. 09/26/2019 Magnesium at 1.8  On review of systems, pt denies diarrhea, arm swelling, new lumps/bumps, leg swelling, skin rashes and any other symptoms.   MEDICAL HISTORY:  Past Medical History:  Diagnosis Date  . Blood in stool   . Cancer ([place])   . Chronic bronchitis with emphysema ([place])   . GERD (gastroesophageal reflux disease)   . Lung cancer [facility])     SURGICAL HISTORY: Past Surgical History:  Procedure Laterality Date  . APPENDECTOMY     teenager  . INGUINAL HERNIA REPAIR Right 2016, 2017   [facility], 2018 [facility] mesh  . IR IMAGING GUIDED PORT INSERTION  04/26/2019    SOCIAL HISTORY: Social History   Socioeconomic History  . Marital status: Single    Spouse name: Not on file  . Number of children: 1  . Years of education: GED  . Highest education level: Not on file  Occupational History    Comment: fork lift driver  Tobacco Use  . Smoking status: Former Smoker    Types: Cigars  . Smokeless tobacco: Never Used  . Tobacco comment: smokes 3 black and mild cigars per day  Substance and Sexual Activity  . Alcohol use: Not Currently  . Drug use: No  . Sexual activity: Yes  Other Topics  Concern  . Not on file  Social History Narrative   Lives alone   Caffeine- coffee, 20 oz daily, tea occas   Social Determinants of Health   Financial Resource Strain:   . Difficulty of Paying Living Expenses:   Food Insecurity:   . Worried About Charity fundraiser in the Last Year:   . Arboriculturist in the Last Year:   Transportation Needs:   . Film/video editor (Medical):   [name] Lack of Transportation (Non-Medical):   Physical Activity:   . Days of Exercise per Week:   . Minutes of Exercise per Session:   Stress:   . Feeling of Stress :   Social Connections:   . Frequency of Communication with Friends and Family:   . Frequency of Social Gatherings with Friends and Family:   . Attends Religious Services:   . Active Member of Clubs or Organizations:   . Attends Archivist Meetings:   [name] Marital Status:   Intimate Partner Violence:   . Fear of Current or Ex-Partner:   .  Emotionally Abused:   [name] Physically Abused:   . Sexually Abused:     FAMILY HISTORY: Family History  Problem Relation Age of Onset  . Prostate cancer Father     ALLERGIES:  is allergic to pregabalin.  MEDICATIONS:  Current Outpatient Medications  Medication Sig Dispense Refill  . ELIQUIS 5 MG TABS tablet TAKE 1 TABLET BY MOUTH TWICE DAILY 60 tablet 11  . LORazepam (ATIVAN) 0.5 MG tablet Take 1 tablet (0.5 mg total) by mouth every 4 (four) hours as needed for anxiety. 60 tablet 0  . omeprazole (PRILOSEC) 40 MG capsule Take 1 capsule (40 mg total) by mouth 2 (two) times daily. 60 capsule 3  . oxyCODONE (OXY IR/ROXICODONE) 5 MG immediate release tablet Take 1-2 tablets (5-10 mg total) by mouth every 4 (four) hours as needed for severe pain. 60 tablet 0  . amoxicillin-clavulanate (AUGMENTIN) 875-125 MG tablet Take 1 tablet by mouth 2 (two) times daily. (Patient not taking: Reported on 09/26/2019) 20 tablet 0   No current facility-administered medications for this visit.    Facility-Administered Medications Ordered in Other Visits  Medication Dose Route Frequency Provider Last Rate Last Admin  . atezolizumab (TECENTRIQ) 1,200 mg in sodium chloride 0.9 % 250 mL chemo infusion  1,200 mg Intravenous Once David Genera, MD      . heparin lock flush 100 unit/mL  500 Units Intracatheter Once PRN David Genera, MD      . sodium chloride flush (NS) 0.9 % injection 10 mL  10 mL Intracatheter PRN David Genera, MD        REVIEW OF SYSTEMS:   A 10+ POINT REVIEW OF SYSTEMS WAS OBTAINED including neurology, dermatology, psychiatry, cardiac, respiratory, lymph, extremities, GI, GU, Musculoskeletal, constitutional, breasts, reproductive, HEENT.  All pertinent positives are noted in the HPI.  All others are negative.   PHYSICAL EXAMINATION: ECOG FS:1 - Symptomatic but completely ambulatory  Vitals:   09/26/19 0923  Pulse: 86  Resp: 18  Temp: 98.5 F (36.9 C)  SpO2: 100%   Wt Readings from Last 3 Encounters:  09/26/19 137 lb 3.2 oz (62.2 kg)  09/05/19 137 lb 1.6 oz (62.2 kg)  08/15/19 137 lb 9.6 oz (62.4 kg)   Body mass index is 19.14 kg/m.    GENERAL:alert, in no acute distress and comfortable SKIN: no acute rashes, no significant lesions EYES: conjunctiva are pink and non-injected, sclera anicteric OROPHARYNX: MMM, no exudates, no oropharyngeal erythema or ulceration NECK: supple, no JVD LYMPH:  no palpable lymphadenopathy in the cervical, axillary or inguinal regions LUNGS: clear to auscultation b/l with normal respiratory effort HEART: regular rate & rhythm ABDOMEN:  normoactive bowel sounds , non tender, not distended. No palpable hepatosplenomegaly.  Extremity: no pedal edema PSYCH: alert & oriented x 3 with fluent speech NEURO: no focal motor/sensory deficits  LABORATORY DATA:  I have reviewed the data as listed  . CBC Latest Ref Rng & Units 09/26/2019 09/05/2019 08/15/2019  WBC 4.0 - 10.5 K/uL 3.8(L) 3.8(L) 4.1  Hemoglobin  13.0 - 17.0 g/dL 11.9(L) 12.2(L) 11.5(L)  Hematocrit 39.0 - 52.0 % 37.3(L) 38.8(L) 36.4(L)  Platelets 150 - 400 K/uL 273 299 245    . CMP Latest Ref Rng & Units 09/26/2019 09/05/2019 08/15/2019  Glucose 70 - 99 mg/dL 96 99 92  BUN 6 - 20 mg/dL '7 6 9  ' Creatinine 0.61 - 1.24 mg/dL 0.85 0.76 0.78  Sodium 135 - 145 mmol/L 140 139 141  Potassium 3.5 - 5.1 mmol/L  4.0 4.3 4.1  Chloride 98 - 111 mmol/L 107 105 108  CO2 22 - 32 mmol/L '25 28 25  ' Calcium 8.9 - 10.3 mg/dL 9.4 9.4 9.3  Total Protein 6.5 - 8.1 g/dL 7.0 7.1 7.0  Total Bilirubin 0.3 - 1.2 mg/dL 0.3 0.3 0.3  Alkaline Phos 38 - 126 U/L 122 140(H) 133(H)  AST 15 - 41 U/L 13(L) 15 16  ALT 0 - 44 U/L '9 15 17    ' 01/22/2019 [facility]: Tumor Mutational Burden     01/22/2019 PD-L1 Immunohistochemistry Analysis    01/22/2019 Soft Tissue Needle Core Biopsy Surgical Pathology     RADIOGRAPHIC STUDIES: I have personally reviewed the radiological images as listed and agreed with the findings in the report. No results found.  ASSESSMENT & PLAN:   This is a 54 year old malewith  1. Newly diagnosed Metastatic Poorly differentiated lung adenocarcinoma Presented with Large neck mass, mediastinal mass, renal mass, and questionable bone lesions no brain mets on MRI brain 01/22/2019 PD-L1 Immunohistochemistry Analysis which revealed "Tumor Proportion Score (TPS) 50%" 05/16/2019 PET/CT ([id number]) revealed "Radiation changes in the right hemithorax, as above. Improving mediastinal nodal metastases. Prior bulky right cervical metastases have resolved. No evidence of metastatic disease in the abdomen/pelvis." 07/19/2019 Esophagus Scan ([id number]) revealed "Mild stricture at the C6-7 level likely related to prior esophageal surgery. Barium tablet was slow to pass through this area but did pass through after 1 minutes. Hiatal hernia with moderate gastroesophageal reflux. Moderate stricture above the hiatal hernia likely due to reflux.  Barium tablet did not pass this area. 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 Acute DVT- Right IJ, Right Innominate Vein, and likely also the SVC- related to malignancy+ tobacco-  on long term Eliquis  4. Symptomatic hemorrhoids  5. Normocytic anemia -Likely due to underlying malignancy -Will check ferritin, iron studies, vitamin B12 level, and RBC folate in the a.m.  6.Thrombocytosis -- likely due to paraneoplastic effect of tumor and reactive due to inflammation and tissue inflammation from RT.-- now resolved.  7. Nicotine dependence -He was counseled about tobacco cessation. -has quit since cancer diagnosis.  PLAN:  -Discussed pt labwork today, 09/26/19; blood counts are steady, blood chemistries are nml, Magnesium is WNL -The pt has no prohibitive toxicities from continuing C10D1 of Atezolizumab at this time -Will continue Atezolizumab until progression or intolerance. -Will give pt contact info for our Education officer, museum to discuss options for returning to work -Will get CT Chest in 2 weeks  -Will see back in 3 weeks with labs    FOLLOW UP: -Please schedule next 4 treatments with [employer] with labs -CT chest in 2 weeks MD visit in 3 weeks (Patient prefers early morning appointments for MD and treatment) [facility] with all labs     The total time spent in the appt was 20 minutes and more than 50% was on counseling and direct patient cares.  All of the patient's questions were answered with apparent satisfaction. The patient knows to call the clinic with any problems, questions or concerns.   David Lone MD [place] AAHIVMS [facility] Hematology/Oncology Physician [facility]  (Office):       [phone] (Work cell):  [phone] (Fax):           [phone]  09/26/2019 10:12 AM  I, Yevette Edwards, am acting as a scribe for Dr. Sullivan Berry.   [name]  I have reviewed the above documentation for accuracy and completeness, and I agree with the above. David Genera MD

## 2019-09-26 NOTE — Patient Instructions (Signed)
[facility] Discharge Instructions for Patients Receiving Chemotherapy  Today you received the following chemotherapy agents Atezolizumab [facility]).  To help prevent nausea and vomiting after your treatment, we encourage you to take your nausea medication as prescribed.  If you develop nausea and vomiting that is not controlled by your nausea medication, call the clinic.   BELOW ARE SYMPTOMS THAT SHOULD BE REPORTED IMMEDIATELY:  *FEVER GREATER THAN 100.5 F  *CHILLS WITH OR WITHOUT FEVER  NAUSEA AND VOMITING THAT IS NOT CONTROLLED WITH YOUR NAUSEA MEDICATION  *UNUSUAL SHORTNESS OF BREATH  *UNUSUAL BRUISING OR BLEEDING  TENDERNESS IN MOUTH AND THROAT WITH OR WITHOUT PRESENCE OF ULCERS  *URINARY PROBLEMS  *BOWEL PROBLEMS  UNUSUAL RASH Items with * indicate a potential emergency and should be followed up as soon as possible.  Feel free to call the clinic should you have any questions or concerns. The clinic phone number is [phone].  Please show the [place] at check-in to the Emergency Department and triage nurse.

## 2019-10-04 ENCOUNTER — Telehealth: Payer: Self-pay | Admitting: Hematology

## 2019-10-04 NOTE — Telephone Encounter (Signed)
Scheduled per 05/19 los, patient has been called and notified.

## 2019-10-10 ENCOUNTER — Encounter (HOSPITAL_COMMUNITY): Payer: Self-pay

## 2019-10-10 ENCOUNTER — Other Ambulatory Visit: Payer: Self-pay

## 2019-10-10 ENCOUNTER — Ambulatory Visit (HOSPITAL_COMMUNITY)
Admission: RE | Admit: 2019-10-10 | Discharge: 2019-10-10 | Disposition: A | Discharge status: Home / Self Care | Payer: Medicaid Other | Source: Ambulatory Visit | Attending: Hematology | Admitting: Hematology

## 2019-10-10 DIAGNOSIS — C3491 Malignant neoplasm of unspecified part of right bronchus or lung: Secondary | ICD-10-CM | POA: Diagnosis not present

## 2019-10-10 DIAGNOSIS — Z5111 Encounter for antineoplastic chemotherapy: Secondary | ICD-10-CM | POA: Insufficient documentation

## 2019-10-10 DIAGNOSIS — I82621 Acute embolism and thrombosis of deep veins of right upper extremity: Secondary | ICD-10-CM | POA: Insufficient documentation

## 2019-10-10 DIAGNOSIS — C3490 Malignant neoplasm of unspecified part of unspecified bronchus or lung: Secondary | ICD-10-CM | POA: Diagnosis not present

## 2019-10-10 IMAGING — CT CT CHEST W/ CM
2 of 3 series · 14 of 36 positions shown, 17 images · IV contrast (omnipaque)
Comparison: [DATE]

CLINICAL DATA: Non-small cell lung cancer, metastatic. Assess
treatment response. Post chemo radiotherapy and ongoing
immunotherapy without current complaints.

EXAM:
CT CHEST WITH CONTRAST
TECHNIQUE: Multidetector CT imaging of the chest was performed during
intravenous contrast administration.
CONTRAST:  75mL OMNIPAQUE IOHEXOL 300 MG/ML  SOLN

[Series 2: axial st · axial · 0.65mm/px · z∈[-356,-52]mm · 11 of 180 slices shown, 14 images]
[im 14/180  mediastinal]
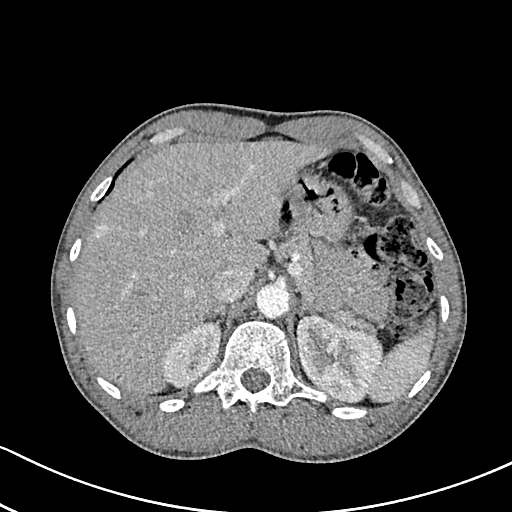
[im 14/180  lung]
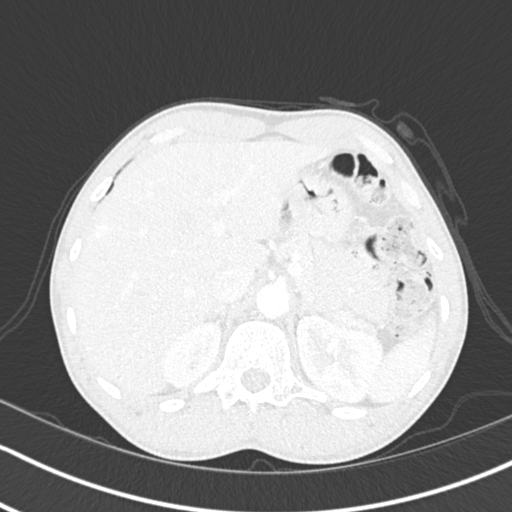
[im 27/180  lung]
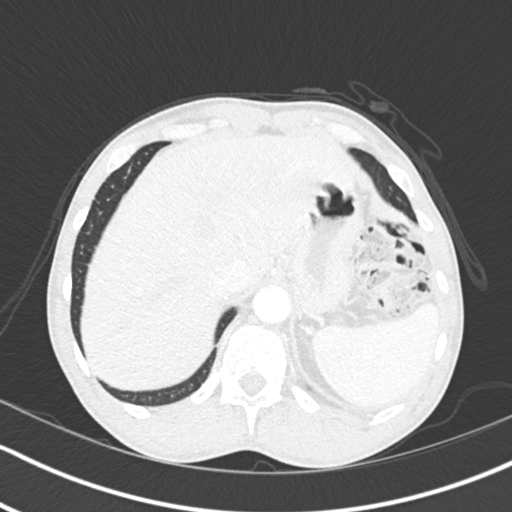
[im 40/180  lung]
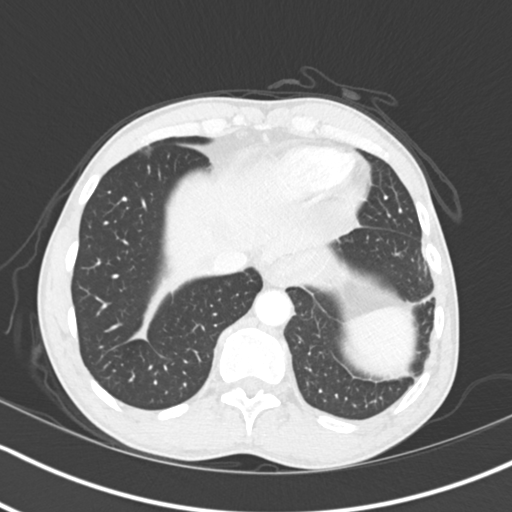
[im 60/180  lung]
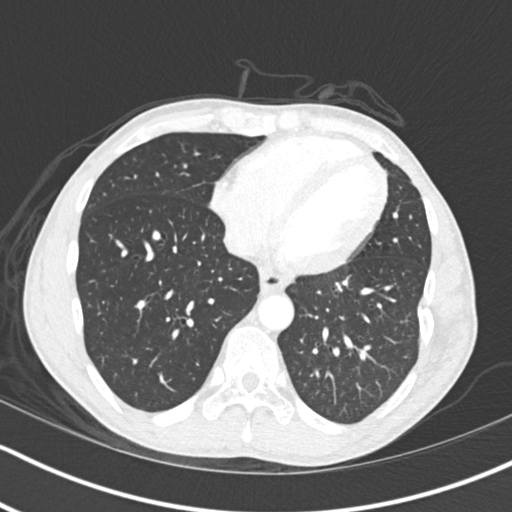
[im 73/180  mediastinal]
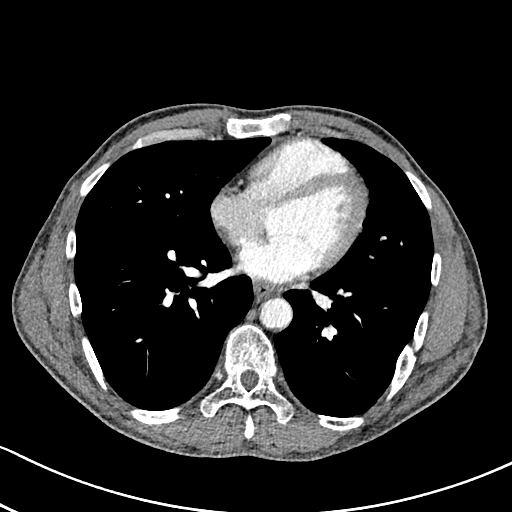
[im 73/180  lung]
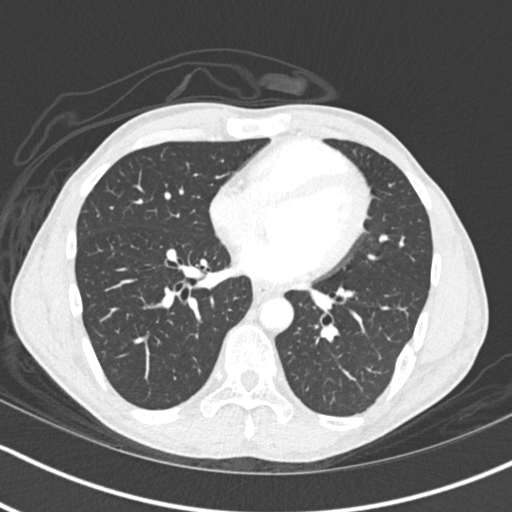
[im 93/180  lung]
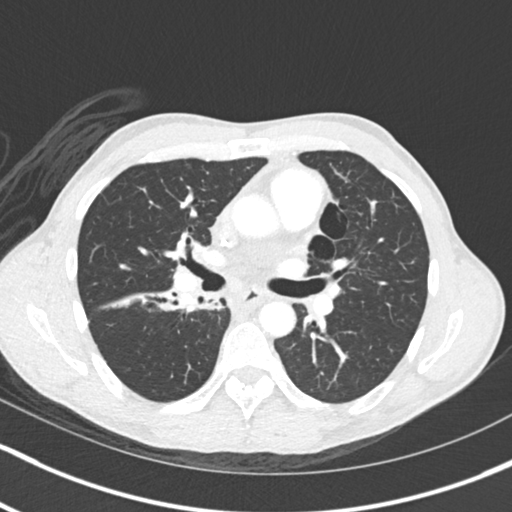
[im 107/180  lung]
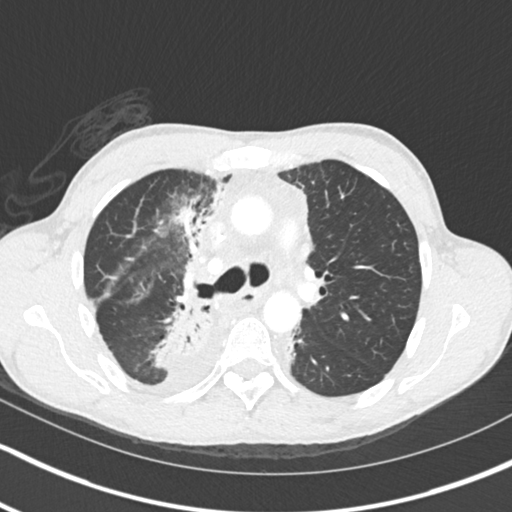
[im 120/180  lung]
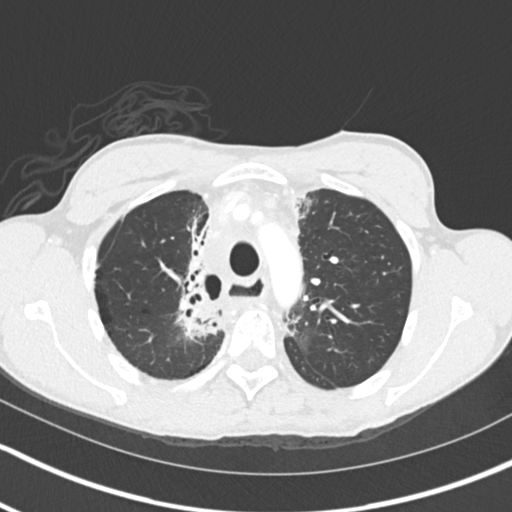
[im 140/180  mediastinal]
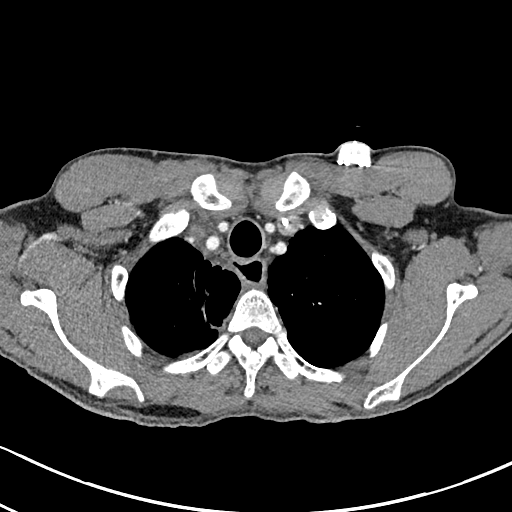
[im 140/180  lung]
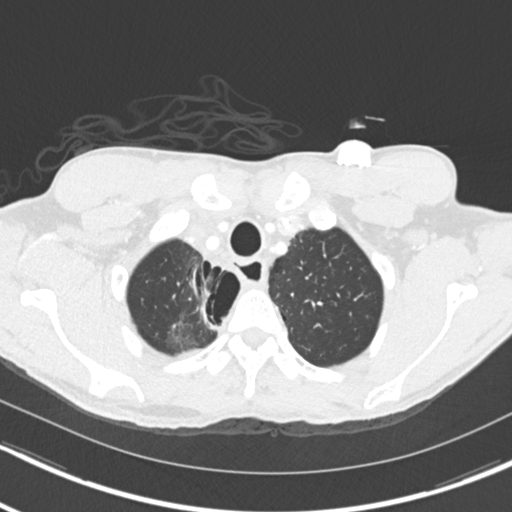
[im 153/180  lung]
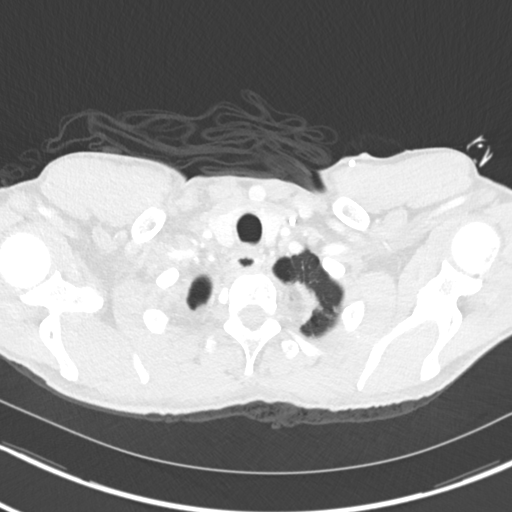
[im 166/180  lung]
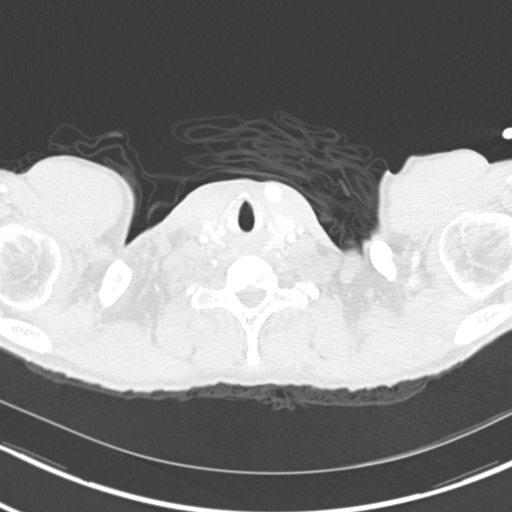

[Series 5: coronal · coronal · 0.59mm/px · 3 of 119 slices shown]
[im 24/119  lung]
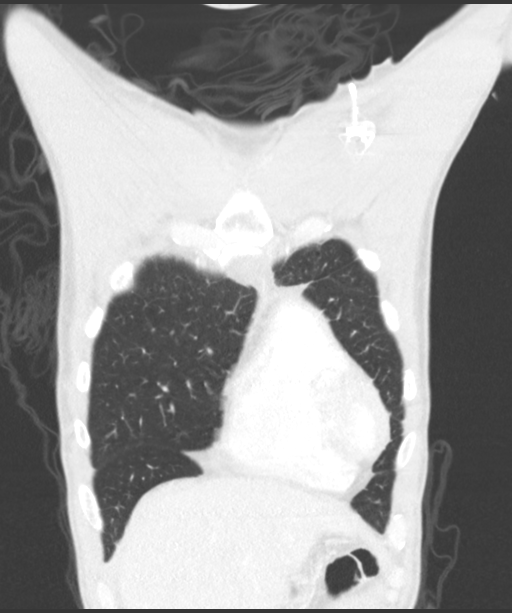
[im 48/119  lung]
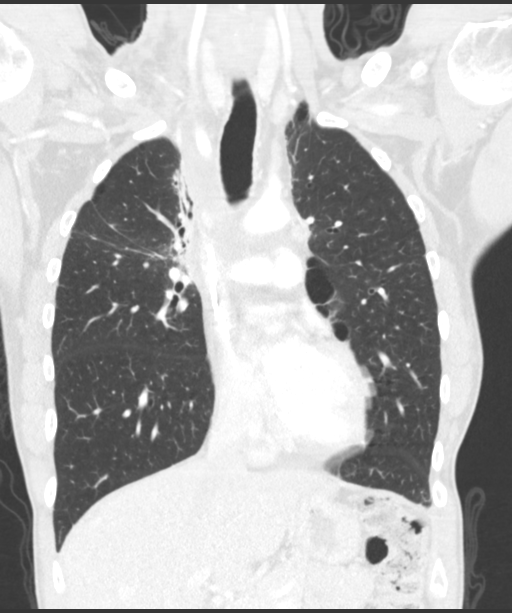
[im 71/119  lung]
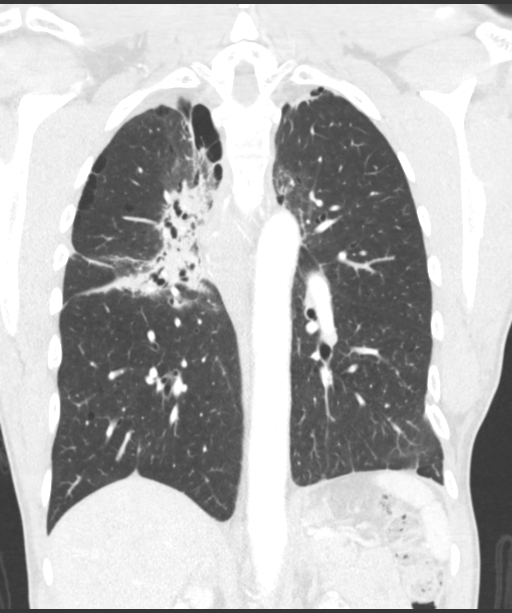

[14 of 36 positions shown; findings below may reference images not displayed]

FINDINGS: Cardiovascular: Studies acquired at a slightly different phase than
the previous examination. Engorged external jugular anterior jugular
veins on the LEFT. Similar appearance. No sign of aneurysmal
dilation of the thoracic aorta. Heart size is normal without
pericardial effusion. Partial encasement of the superior vena cava
with marked improvement since the previous study where there was
completed encasement. There is persistent occlusion of the RIGHT
brachiocephalic vein.

Soft tissue occlusion of the superior vena cava is diminished. On
image 69 of series 2 this measures 2.6 x 2.2 cm, previously
approximately 6.3 x 5.6 cm. A discrete channel of contrast is seen
flowing from LEFT brachiocephalic in the superior vena cava, this
could not be delineated without clear evidence of filling defects
compatible with invasion on the previous exam this is less
conspicuous on the current study as well.

Central pulmonary vasculature is unremarkable.

Mediastinum/Nodes: Soft tissue throughout the mediastinum diminished
compared to the previous study as outlined above.

RIGHT hilar nodal mass diminished in size measuring approximately 18
x 9 mm previously approximately 3.0 x 2.8 cm. Mural stratification
of the esophagus and patulous appearance with fluid in the lumen.

Lungs/Pleura: Paramediastinal post radiation change with
consolidative changes along the mediastinal border of the superior
mediastinum on the LEFT and RIGHT, associated with bronchiectasis.
No new or suspicious pulmonary nodule.

Upper Abdomen: Incidental imaging of upper abdominal contents with
limited assessment. No acute process. Adrenal glands are normal.

Musculoskeletal: No acute musculoskeletal process.
IMPRESSION: 1. Diminished mediastinal soft tissue and vascular involvement as
described, still with occlusion of the RIGHT brachiocephalic vein.
2. Decreased size of RIGHT hilar nodal mass.
3. Paramediastinal post radiation change with consolidative changes
along the mediastinal border of the LEFT and RIGHT, associated with
bronchiectasis. Findings are similar to the previous study.
4. Mural stratification of the esophagus and patulous appearance
with fluid in the lumen. Correlate with any clinical evidence of
esophagitis, perhaps worse when compared to previous imaging.
5. Aortic atherosclerosis.

Aortic Atherosclerosis ([V8]-[V8]).

## 2019-10-10 MED ORDER — SODIUM CHLORIDE (PF) 0.9 % IJ SOLN
INTRAMUSCULAR | Status: AC
  Filled 2019-10-10: qty 50

## 2019-10-10 MED ORDER — HEPARIN SOD (PORK) LOCK FLUSH 100 UNIT/ML IV SOLN
INTRAVENOUS | Status: AC
  Filled 2019-10-10: qty 5

## 2019-10-10 MED ORDER — IOHEXOL 300 MG/ML  SOLN
75.0000 mL | Freq: Once | INTRAMUSCULAR | Status: AC | PRN
  Administered 2019-10-10: 75 mL via INTRAVENOUS

## 2019-10-12 NOTE — Progress Notes (Signed)
Pharmacist Chemotherapy Monitoring - Follow Up Assessment    I verify that I have reviewed each item in the below checklist:  . Regimen for the patient is scheduled for the appropriate day and plan matches scheduled date. [name] Appropriate non-routine labs are ordered dependent on drug ordered. . If applicable, additional medications reviewed and ordered per protocol based on lifetime cumulative doses and/or treatment regimen.   Plan for follow-up and/or issues identified: No . I-vent associated with next due treatment: No . MD and/or nursing notified: No  David Berry K 10/12/2019 12:59 PM

## 2019-10-17 NOTE — Progress Notes (Signed)
HEMATOLOGY/ONCOLOGY CLINIC NOTE  Date of Service: 10/18/2019  Patient Care Team: [name], MD as PCP - General (Family Medicine)  CHIEF COMPLAINTS/PURPOSE OF CONSULTATION:  contnued management of Metastatic Poorly differentiated lung adenocarcinoma  HISTORY OF PRESENTING ILLNESS:   David Berry is a 54 year old male with no significant medical history.  He presented to the hospital with progressive right arm and chest pain with new right-sided neck swelling.  The patient states that he was diagnosed with carpal tunnel syndrome received an injection in August.  However, he continued to have progressive pain up and down his right arm that would also radiate to his right chest.  He took ibuprofen with no improvement.  1 week prior to admission, he developed new right sided neck swelling.  He presented to the emergency room for further evaluation.  Work-up in the emergency room was significant for a CT angiogram of the neck and chest which revealed a bulky soft tissue mass in the right neck continuing into the upper chest most compatible with extensive metastatic lymphadenopathy.  This mass measures up to 34 mm and encases multiple vessels including the right upper lobe pulmonary vessels, superior vena cava, and brachiocephalic vessel.  He also had a large poorly defined malignant appearing infiltrative mass measuring 6.2 x 5.6 x 7.9 cm within the right anterior and middle mediastinum with suspected invasion of the distal left brachiocephalic vein and probable tumor occlusion of the right subclavian and jugular veins.  There is also suspected tumor invasion of the upper SVC with string-like narrowing of the SVC but with patency of the SVC just above the right atrium.  The tumor abuts the aorta and great vessels and also results in significant narrowing of the right upper lobe pulmonary arterial vessels.  He also had a CT of the abdomen pelvis which showed a 15 mm heterogeneously enhancing lesion  in the upper pole of the right kidney concerning for renal cell carcinoma, no lymphadenopathy in the abdomen or pelvis, focal hyperenhancement of the anterior left liver most likely related to aberrant venous anatomy/flow, sclerotic lesions in the right acetabulum and left iliac bone are indeterminate.  These are likely bone islands but attention on follow-up is recommended a metastatic disease cannot be excluded.  MRI of the brain with and without contrast did not show any evidence of intracranial metastatic disease. The patient underwent a CT-guided biopsy in interventional radiology on 01/22/2019.  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   When seen today, patient reports ongoing pain and swelling to his right arm and right side of his neck.  Reports ongoing pain to his right chest.  He reports intermittent facial swelling which is worse in the morning and improves throughout the day.  He reports anorexia and a weight loss of 20 pounds over the past few weeks. He has had dizziness for the past few months.  Denies headaches.  He also noted that on the day of admission his peripheral vision was poor.  His vision is now improved.  He denies food getting stuck or difficulty swallowing but does feel as though his throat is "tight."  He reports a nonproductive cough.  Denies fevers and chills.  He is not really having any shortness of breath.  Denies abdominal pain, nausea, vomiting, constipation, diarrhea.  He has not noticed any epistaxis, hemoptysis, hematemesis, hematuria, melena, hematochezia.  The patient is single.  He has 1 daughter who lives  in [place], [place].  Reports occasional alcohol use and currently smokes 3 cigars a day since 1997.  He is currently living in a rooming house with other roommates who all have their own room.  They share a common bathroom and kitchen.  Medical oncology was asked see the  patient to make recommendations regarding his newly diagnosed poorly differentiated carcinoma.  History reviewed. No pertinent past medical history.   Current Treatment:  Atezolizumab maintenance   INTERVAL HISTORY:   David Berry. is a 54 y.o. male who is here for evaluation and management of metastatic poorly differentiated lung adenocarcinoma. He is here for C11 of Atezolizumab. The patient's last visit with [place] was on 09/26/19. The pt reports that he is doing well overall.  The pt reports he is good. He has bee swelling and sore over the right upper chest. There is no redness around the area. The pt has not been lifting anything heavy or hit the area. Pt uses Lorazepam for his nausea. He has had no reflux, and food has not been getting stuck while swallowing.   Of note since the patient's last visit, pt has had CT Chest W Contrast ([id number]) completed on 10/10/19 with results revealing "1. Diminished mediastinal soft tissue and vascular involvement as described, still with occlusion of the RIGHT brachiocephalic vein. 2. Decreased size of RIGHT hilar nodal mass. 3. Paramediastinal post radiation change with consolidative changes along the mediastinal border of the LEFT and RIGHT, associated with bronchiectasis. Findings are similar to the previous study. 4. Mural stratification of the esophagus and patulous appearance with fluid in the lumen. Correlate with any clinical evidence of esophagitis, perhaps worse when compared to previous imaging. 5. Aortic atherosclerosis."  Lab results today (10/18/19) of CBC w/diff and CMP is as follows: all values are WNL except for WBC at 3.6k, RBC at 4.10, Hemoglobin at 11.7, HCT at 36.7, Lymph's Abs at 0.6K, Glucose at 100, AST at 13, Alkaline Phosphatase at 135 10/18/19 of Magnesium at 2.0: WNL  On review of systems, pt denies rashes, diarrhea, irregular bowl movements, swallowing difficulty, acid reflux, hand swelling, pain along back and any other  symptoms.   MEDICAL HISTORY:  Past Medical History:  Diagnosis Date  . Blood in stool   . Cancer ([place])   . Chronic bronchitis with emphysema ([place])   . GERD (gastroesophageal reflux disease)   . Lung cancer [facility])     SURGICAL HISTORY: Past Surgical History:  Procedure Laterality Date  . APPENDECTOMY     teenager  . INGUINAL HERNIA REPAIR Right 2016, 2017   [facility], 2018 [facility] mesh  . IR IMAGING GUIDED PORT INSERTION  04/26/2019    SOCIAL HISTORY: Social History   Socioeconomic History  . Marital status: Single    Spouse name: Not on file  . Number of children: 1  . Years of education: GED  . Highest education level: Not on file  Occupational History    Comment: fork lift driver  Tobacco Use  . Smoking status: Former Smoker    Types: Cigars  . Smokeless tobacco: Never Used  . Tobacco comment: smokes 3 black and mild cigars per day  Substance and Sexual Activity  . Alcohol use: Not Currently  . Drug use: No  . Sexual activity: Yes  Other Topics Concern  . Not on file  Social History Narrative   Lives alone   Caffeine- coffee, 20 oz daily, tea occas   Social Determinants of Health  Financial Resource Strain:   . Difficulty of Paying Living Expenses:   Food Insecurity:   . Worried About Charity fundraiser in the Last Year:   . Arboriculturist in the Last Year:   Transportation Needs:   . Film/video editor (Medical):   [name] Lack of Transportation (Non-Medical):   Physical Activity:   . Days of Exercise per Week:   . Minutes of Exercise per Session:   Stress:   . Feeling of Stress :   Social Connections:   . Frequency of Communication with Friends and Family:   . Frequency of Social Gatherings with Friends and Family:   . Attends Religious Services:   . Active Member of Clubs or Organizations:   . Attends Archivist Meetings:   [name] Marital Status:   Intimate Partner Violence:   . Fear of Current or Ex-Partner:   .  Emotionally Abused:   [name] Physically Abused:   . Sexually Abused:     FAMILY HISTORY: Family History  Problem Relation Age of Onset  . Prostate cancer Father     ALLERGIES:  is allergic to pregabalin.  MEDICATIONS:  Current Outpatient Medications  Medication Sig Dispense Refill  . ELIQUIS 5 MG TABS tablet TAKE 1 TABLET BY MOUTH TWICE DAILY 60 tablet 11  . LORazepam (ATIVAN) 0.5 MG tablet Take 1 tablet (0.5 mg total) by mouth every 4 (four) hours as needed for anxiety. 60 tablet 0  . omeprazole (PRILOSEC) 40 MG capsule Take 1 capsule (40 mg total) by mouth 2 (two) times daily. 60 capsule 3  . oxyCODONE (OXY IR/ROXICODONE) 5 MG immediate release tablet Take 1-2 tablets (5-10 mg total) by mouth every 4 (four) hours as needed for severe pain. 60 tablet 0  . amoxicillin-clavulanate (AUGMENTIN) 875-125 MG tablet Take 1 tablet by mouth 2 (two) times daily. (Patient not taking: Reported on 10/18/2019) 20 tablet 0   No current facility-administered medications for this visit.   Facility-Administered Medications Ordered in Other Visits  Medication Dose Route Frequency Provider Last Rate Last Admin  . atezolizumab (TECENTRIQ) 1,200 mg in sodium chloride 0.9 % 250 mL chemo infusion  1,200 mg Intravenous Once Brunetta Genera, MD      . heparin lock flush 100 unit/mL  500 Units Intracatheter Once PRN Brunetta Genera, MD      . sodium chloride flush (NS) 0.9 % injection 10 mL  10 mL Intracatheter PRN Brunetta Genera, MD        REVIEW OF SYSTEMS:   A 10+ POINT REVIEW OF SYSTEMS WAS OBTAINED including neurology, dermatology, psychiatry, cardiac, respiratory, lymph, extremities, GI, GU, Musculoskeletal, constitutional, breasts, reproductive, HEENT.  All pertinent positives are noted in the HPI.  All others are negative.   PHYSICAL EXAMINATION: ECOG FS:1 - Symptomatic but completely ambulatory  Vitals:   10/18/19 0845  BP: 93/65  Pulse: 92  Resp: 18  Temp: (!) 97.5 F (36.4 C)    SpO2: 99%   Wt Readings from Last 3 Encounters:  10/18/19 137 lb 1.6 oz (62.2 kg)  09/26/19 137 lb 3.2 oz (62.2 kg)  09/05/19 137 lb 1.6 oz (62.2 kg)   Body mass index is 19.12 kg/m.    GENERAL:alert, in no acute distress and comfortable SKIN: no acute rashes, no significant lesions EYES: conjunctiva are pink and non-injected, sclera anicteric OROPHARYNX: MMM, no exudates, no oropharyngeal erythema or ulceration NECK: supple, no JVD LYMPH:  no palpable lymphadenopathy in the cervical, axillary or  inguinal regions LUNGS: clear to auscultation b/l with normal respiratory effort HEART: regular rate & rhythm ABDOMEN:  normoactive bowel sounds , non tender, not distended. Extremity: no pedal edema PSYCH: alert & oriented x 3 with fluent speech NEURO: no focal motor/sensory deficits  LABORATORY DATA:  I have reviewed the data as listed  . CBC Latest Ref Rng & Units 10/18/2019 09/26/2019 09/05/2019  WBC 4.0 - 10.5 K/uL 3.6(L) 3.8(L) 3.8(L)  Hemoglobin 13.0 - 17.0 g/dL 11.7(L) 11.9(L) 12.2(L)  Hematocrit 39 - 52 % 36.7(L) 37.3(L) 38.8(L)  Platelets 150 - 400 K/uL 287 273 299    . CMP Latest Ref Rng & Units 10/18/2019 09/26/2019 09/05/2019  Glucose 70 - 99 mg/dL 100(H) 96 99  BUN 6 - 20 mg/dL _0 Creatinine 0.61 - 1.24 mg/dL 0.86 0.85 0.76  Sodium 135 - 145 mmol/L 140 140 139  Potassium 3.5 - 5.1 mmol/L 4.0 4.0 4.3  Chloride 98 - 111 mmol/L 106 107 105  CO2 22 - 32 mmol/L _1 Calcium 8.9 - 10.3 mg/dL 9.3 9.4 9.4  Total Protein 6.5 - 8.1 g/dL 7.0 7.0 7.1  Total Bilirubin 0.3 - 1.2 mg/dL 0.4 0.3 0.3  Alkaline Phos 38 - 126 U/L 135(H) 122 140(H)  AST 15 - 41 U/L 13(L) 13(L) 15  ALT 0 - 44 U/L _2 01/22/2019 [facility]: Tumor Mutational Burden     01/22/2019 PD-L1 Immunohistochemistry Analysis    01/22/2019 Soft Tissue Needle Core Biopsy Surgical Pathology     RADIOGRAPHIC STUDIES: I have personally reviewed the radiological images as listed and  agreed with the findings in the report. CT Chest W Contrast  Result Date: 10/10/2019 CLINICAL DATA:  Non-small cell lung cancer, metastatic. Assess treatment response. Post chemo radiotherapy and ongoing immunotherapy without current complaints. EXAM: CT CHEST WITH CONTRAST TECHNIQUE: Multidetector CT imaging of the chest was performed during intravenous contrast administration. CONTRAST:  29m OMNIPAQUE IOHEXOL 300 MG/ML  SOLN COMPARISON:  01/20/2019 FINDINGS: Cardiovascular: Studies acquired at a slightly different phase than the previous examination. Engorged external jugular anterior jugular veins on the LEFT. Similar appearance. No sign of aneurysmal dilation of the thoracic aorta. Heart size is normal without pericardial effusion. Partial encasement of the superior vena cava with marked improvement since the previous study where there was completed encasement. 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 Soft tissue occlusion of the superior vena cava is diminished. On image 69 of series 2 this measures 2.6 x 2.2 cm, previously approximately 6.3 x 5.6 cm. A discrete channel of contrast is seen flowing from LEFT brachiocephalic in the superior vena cava, this could not be delineated without clear evidence of filling defects compatible with invasion on the previous exam this is less conspicuous on the current study as well. Central pulmonary vasculature is unremarkable. Mediastinum/Nodes: Soft tissue throughout the mediastinum diminished compared to the previous study as outlined above. RIGHT hilar nodal mass diminished in size measuring approximately 18 x 9 mm previously approximately 3.0 x 2.8 cm. Mural stratification of the esophagus and patulous appearance with fluid in the lumen. Lungs/Pleura: Paramediastinal post radiation change with consolidative changes along the mediastinal border of the superior mediastinum on the LEFT and RIGHT, associated with bronchiectasis. No new or  suspicious pulmonary nodule. Upper Abdomen: Incidental imaging of upper abdominal contents with limited assessment. No acute process. Adrenal glands are normal. Musculoskeletal: No acute musculoskeletal process. IMPRESSION: 1. Diminished mediastinal soft tissue and vascular involvement  as described, still with occlusion of the RIGHT brachiocephalic vein. 2. Decreased size of RIGHT hilar nodal mass. 3. Paramediastinal post radiation change with consolidative changes along the mediastinal border of the LEFT and RIGHT, associated with bronchiectasis. Findings are similar to the previous study. 4. Mural stratification of the esophagus and patulous appearance with fluid in the lumen. Correlate with any clinical evidence of esophagitis, perhaps worse when compared to previous imaging. 5. Aortic atherosclerosis. Aortic Atherosclerosis (ICD10-I70.0). Electronically Signed   By: Zetta Bills M.D.   On: 10/10/2019 16:30    ASSESSMENT & PLAN:   This is a 54 year old malewith  1. Newly diagnosed Metastatic Poorly differentiated lung adenocarcinoma Presented with Large neck mass, mediastinal mass, renal mass, and questionable bone lesions no brain mets on MRI brain 01/22/2019 PD-L1 Immunohistochemistry Analysis which revealed "Tumor Proportion Score (TPS) 50%" 05/16/2019 PET/CT ([id number]) revealed "Radiation changes in the right hemithorax, as above. Improving mediastinal nodal metastases. Prior bulky right cervical metastases have resolved. No evidence of metastatic disease in the abdomen/pelvis." 07/19/2019 Esophagus Scan ([id number]) revealed "Mild stricture at the C6-7 level likely related to prior esophageal surgery. Barium tablet was slow to pass through this area but did pass through after 1 minutes. Hiatal hernia with moderate gastroesophageal reflux. Moderate stricture above the hiatal hernia likely due to reflux. Barium tablet did not pass this area. 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 Acute DVT- Right IJ, Right Innominate Vein, and likely also the SVC- related to malignancy+ tobacco-  on long term Eliquis  4. Symptomatic hemorrhoids  5. Normocytic anemia -Likely due to underlying malignancy -Will check ferritin, iron studies, vitamin B12 level, and RBC folate in the a.m.  6.Thrombocytosis -- likely due to paraneoplastic effect of tumor and reactive due to inflammation and tissue inflammation from RT.-- now resolved.  7. Nicotine dependence -He was counseled about tobacco cessation. -has quit since cancer diagnosis.  PLAN:  -Discussed pt labwork today, 10/18/19;  of CBC w/diff and CMP is as follows: all values are WNL except for WBC at 3.6k, RBC at 4.10, Hemoglobin at 11.7, HCT at 36.7, Lymph's Abs at 0.6K, Glucose at 100, AST at 13, Alkaline Phosphatase at 135 -Discussed 10/18/19 of Magnesium at 2.0: WNL -Discussed 10/10/19 of  CT Chest W Contrast ([id number])- no evidence of lung cancer progression at this time. -The pt has no prohibitive toxicities from continuing C11D1 of Atezolizumab at this time -Will continue Atezolizumab until progression or intolerance. -Advised on tumor likely was due to smoking  -Advised on pain on upper chest- Recommends icing it -Recommends eating as much as the pt can -Recommends continuing blood thinners-ELiquis  FOLLOW UP: F/u as pr scheduled appointments.   The total time spent in the appt was 20 minutes and more than 50% was on counseling and direct patient cares.  All of the patient's questions were answered with apparent satisfaction. The patient knows to call the clinic with any problems, questions or concerns.  Sullivan Lone MD MS AAHIVMS [facility] Hematology/Oncology Physician [facility]  (Office):       [phone] (Work cell):  [phone] (Fax):           [phone]  10/18/2019 10:09 AM  I,  Dawayne Cirri am acting as a Education administrator for Dr. Sullivan Lone.   .I have reviewed the above documentation for accuracy and completeness, and I agree with the  above. .Brunetta Genera MD

## 2019-10-18 ENCOUNTER — Inpatient Hospital Stay (HOSPITAL_BASED_OUTPATIENT_CLINIC_OR_DEPARTMENT_OTHER): Payer: Medicaid Other | Admitting: Hematology

## 2019-10-18 ENCOUNTER — Other Ambulatory Visit: Payer: Self-pay

## 2019-10-18 ENCOUNTER — Inpatient Hospital Stay: Payer: Medicaid Other | Attending: Hematology

## 2019-10-18 ENCOUNTER — Inpatient Hospital Stay: Payer: Medicaid Other

## 2019-10-18 VITALS — BP 93/65 | HR 92 | Temp 97.5°F | Resp 18 | Ht 71.0 in | Wt 137.1 lb

## 2019-10-18 DIAGNOSIS — C3491 Malignant neoplasm of unspecified part of right bronchus or lung: Secondary | ICD-10-CM

## 2019-10-18 DIAGNOSIS — D7589 Other specified diseases of blood and blood-forming organs: Secondary | ICD-10-CM | POA: Insufficient documentation

## 2019-10-18 DIAGNOSIS — D649 Anemia, unspecified: Secondary | ICD-10-CM | POA: Diagnosis not present

## 2019-10-18 DIAGNOSIS — Z7901 Long term (current) use of anticoagulants: Secondary | ICD-10-CM | POA: Insufficient documentation

## 2019-10-18 DIAGNOSIS — Z5112 Encounter for antineoplastic immunotherapy: Secondary | ICD-10-CM | POA: Diagnosis present

## 2019-10-18 DIAGNOSIS — C3411 Malignant neoplasm of upper lobe, right bronchus or lung: Secondary | ICD-10-CM | POA: Diagnosis present

## 2019-10-18 DIAGNOSIS — Z95828 Presence of other vascular implants and grafts: Secondary | ICD-10-CM

## 2019-10-18 DIAGNOSIS — Z7189 Other specified counseling: Secondary | ICD-10-CM

## 2019-10-18 DIAGNOSIS — Z86718 Personal history of other venous thrombosis and embolism: Secondary | ICD-10-CM | POA: Diagnosis not present

## 2019-10-18 DIAGNOSIS — Z5111 Encounter for antineoplastic chemotherapy: Secondary | ICD-10-CM | POA: Diagnosis not present

## 2019-10-18 DIAGNOSIS — Z87891 Personal history of nicotine dependence: Secondary | ICD-10-CM | POA: Diagnosis not present

## 2019-10-18 LAB — CMP (CANCER CENTER ONLY)
ALT: 11 U/L (ref 0–44)
AST: 13 U/L — ABNORMAL LOW (ref 15–41)
Albumin: 3.7 g/dL (ref 3.5–5.0)
Alkaline Phosphatase: 135 U/L — ABNORMAL HIGH (ref 38–126)
Anion gap: 9 (ref 5–15)
BUN: 8 mg/dL (ref 6–20)
CO2: 25 mmol/L (ref 22–32)
Calcium: 9.3 mg/dL (ref 8.9–10.3)
Chloride: 106 mmol/L (ref 98–111)
Creatinine: 0.86 mg/dL (ref 0.61–1.24)
GFR, Est AFR Am: >60 mL/min (ref >60)
GFR, Estimated: >60 mL/min (ref >60)
Glucose, Bld: 100 mg/dL — ABNORMAL HIGH (ref 70–99)
Potassium: 4 mmol/L (ref 3.5–5.1)
Sodium: 140 mmol/L (ref 135–145)
Total Bilirubin: 0.4 mg/dL (ref 0.3–1.2)
Total Protein: 7 g/dL (ref 6.5–8.1)

## 2019-10-18 LAB — CBC WITH DIFFERENTIAL/PLATELET
Abs Immature Granulocytes: 0 10*3/uL (ref 0.00–0.07)
Basophils Absolute: 0 10*3/uL (ref 0.0–0.1)
Basophils Relative: 1 %
Eosinophils Absolute: 0 10*3/uL (ref 0.0–0.5)
Eosinophils Relative: 1 %
HCT: 36.7 % — ABNORMAL LOW (ref 39.0–52.0)
Hemoglobin: 11.7 g/dL — ABNORMAL LOW (ref 13.0–17.0)
Immature Granulocytes: 0 %
Lymphocytes Relative: 17 %
Lymphs Abs: 0.6 10*3/uL — ABNORMAL LOW (ref 0.7–4.0)
MCH: 28.5 pg (ref 26.0–34.0)
MCHC: 31.9 g/dL (ref 30.0–36.0)
MCV: 89.5 fL (ref 80.0–100.0)
Monocytes Absolute: 0.6 10*3/uL (ref 0.1–1.0)
Monocytes Relative: 17 %
Neutro Abs: 2.3 10*3/uL (ref 1.7–7.7)
Neutrophils Relative %: 64 %
Platelets: 287 10*3/uL (ref 150–400)
RBC: 4.1 MIL/uL — ABNORMAL LOW (ref 4.22–5.81)
RDW: 13.3 % (ref 11.5–15.5)
WBC: 3.6 10*3/uL — ABNORMAL LOW (ref 4.0–10.5)
nRBC: 0 % (ref 0.0–0.2)

## 2019-10-18 LAB — MAGNESIUM: Magnesium: 2 mg/dL (ref 1.7–2.4)

## 2019-10-18 MED ORDER — SODIUM CHLORIDE 0.9% FLUSH
10.0000 mL | INTRAVENOUS | Status: DC | PRN
  Administered 2019-10-18: 10 mL
  Filled 2019-10-18: qty 10

## 2019-10-18 MED ORDER — DIPHENHYDRAMINE HCL 25 MG PO TABS
25.0000 mg | ORAL_TABLET | Freq: Once | ORAL | Status: AC
  Administered 2019-10-18: 25 mg via ORAL
  Filled 2019-10-18: qty 1

## 2019-10-18 MED ORDER — FAMOTIDINE 20 MG PO TABS
20.0000 mg | ORAL_TABLET | Freq: Once | ORAL | Status: AC
  Administered 2019-10-18: 20 mg via ORAL

## 2019-10-18 MED ORDER — SODIUM CHLORIDE 0.9 % IV SOLN
Freq: Once | INTRAVENOUS | Status: AC
  Filled 2019-10-18: qty 250

## 2019-10-18 MED ORDER — SODIUM CHLORIDE 0.9 % IV SOLN
1200.0000 mg | Freq: Once | INTRAVENOUS | Status: AC
  Administered 2019-10-18: 1200 mg via INTRAVENOUS
  Filled 2019-10-18: qty 20

## 2019-10-18 MED ORDER — DIPHENHYDRAMINE HCL 25 MG PO CAPS
ORAL_CAPSULE | ORAL | Status: AC
  Filled 2019-10-18: qty 1

## 2019-10-18 MED ORDER — HEPARIN SOD (PORK) LOCK FLUSH 100 UNIT/ML IV SOLN
500.0000 [IU] | Freq: Once | INTRAVENOUS | Status: AC | PRN
  Administered 2019-10-18: 500 [IU]
  Filled 2019-10-18: qty 5

## 2019-10-18 MED ORDER — ACETAMINOPHEN 325 MG PO TABS
ORAL_TABLET | ORAL | Status: AC
  Filled 2019-10-18: qty 2

## 2019-10-18 MED ORDER — ACETAMINOPHEN 325 MG PO TABS
650.0000 mg | ORAL_TABLET | Freq: Once | ORAL | Status: AC
  Administered 2019-10-18: 650 mg via ORAL

## 2019-10-18 MED ORDER — FAMOTIDINE 20 MG PO TABS
ORAL_TABLET | ORAL | Status: AC
  Filled 2019-10-18: qty 1

## 2019-10-18 NOTE — Patient Instructions (Signed)
[facility] Discharge Instructions for Patients Receiving Chemotherapy  Today you received the following chemotherapy agents: atezolizumab.  To help prevent nausea and vomiting after your treatment, we encourage you to take your nausea medication as directed.   If you develop nausea and vomiting that is not controlled by your nausea medication, call the clinic.   BELOW ARE SYMPTOMS THAT SHOULD BE REPORTED IMMEDIATELY:  *FEVER GREATER THAN 100.5 F  *CHILLS WITH OR WITHOUT FEVER  NAUSEA AND VOMITING THAT IS NOT CONTROLLED WITH YOUR NAUSEA MEDICATION  *UNUSUAL SHORTNESS OF BREATH  *UNUSUAL BRUISING OR BLEEDING  TENDERNESS IN MOUTH AND THROAT WITH OR WITHOUT PRESENCE OF ULCERS  *URINARY PROBLEMS  *BOWEL PROBLEMS  UNUSUAL RASH Items with * indicate a potential emergency and should be followed up as soon as possible.  Feel free to call the clinic should you have any questions or concerns. The clinic phone number is [phone].  Please show the [facility] at check-in to the Emergency Department and triage nurse.

## 2019-11-07 ENCOUNTER — Inpatient Hospital Stay: Payer: Medicaid Other

## 2019-11-07 ENCOUNTER — Inpatient Hospital Stay (HOSPITAL_BASED_OUTPATIENT_CLINIC_OR_DEPARTMENT_OTHER): Payer: Medicaid Other | Admitting: Hematology

## 2019-11-07 ENCOUNTER — Other Ambulatory Visit: Payer: Self-pay

## 2019-11-07 VITALS — BP 104/80 | HR 72 | Temp 98.2°F | Resp 18 | Wt 136.5 lb

## 2019-11-07 DIAGNOSIS — Z5111 Encounter for antineoplastic chemotherapy: Secondary | ICD-10-CM | POA: Diagnosis not present

## 2019-11-07 DIAGNOSIS — C3491 Malignant neoplasm of unspecified part of right bronchus or lung: Secondary | ICD-10-CM | POA: Diagnosis not present

## 2019-11-07 DIAGNOSIS — Z5112 Encounter for antineoplastic immunotherapy: Secondary | ICD-10-CM | POA: Diagnosis not present

## 2019-11-07 DIAGNOSIS — Z95828 Presence of other vascular implants and grafts: Secondary | ICD-10-CM

## 2019-11-07 DIAGNOSIS — Z7189 Other specified counseling: Secondary | ICD-10-CM

## 2019-11-07 LAB — CBC WITH DIFFERENTIAL/PLATELET
Abs Immature Granulocytes: 0 10*3/uL (ref 0.00–0.07)
Basophils Absolute: 0 10*3/uL (ref 0.0–0.1)
Basophils Relative: 0 %
Eosinophils Absolute: 0 10*3/uL (ref 0.0–0.5)
Eosinophils Relative: 1 %
HCT: 37.2 % — ABNORMAL LOW (ref 39.0–52.0)
Hemoglobin: 11.7 g/dL — ABNORMAL LOW (ref 13.0–17.0)
Immature Granulocytes: 0 %
Lymphocytes Relative: 20 %
Lymphs Abs: 0.8 10*3/uL (ref 0.7–4.0)
MCH: 27.7 pg (ref 26.0–34.0)
MCHC: 31.5 g/dL (ref 30.0–36.0)
MCV: 87.9 fL (ref 80.0–100.0)
Monocytes Absolute: 0.5 10*3/uL (ref 0.1–1.0)
Monocytes Relative: 13 %
Neutro Abs: 2.6 10*3/uL (ref 1.7–7.7)
Neutrophils Relative %: 66 %
Platelets: 294 10*3/uL (ref 150–400)
RBC: 4.23 MIL/uL (ref 4.22–5.81)
RDW: 14 % (ref 11.5–15.5)
WBC: 3.9 10*3/uL — ABNORMAL LOW (ref 4.0–10.5)
nRBC: 0 % (ref 0.0–0.2)

## 2019-11-07 LAB — CMP (CANCER CENTER ONLY)
ALT: 9 U/L (ref 0–44)
AST: 13 U/L — ABNORMAL LOW (ref 15–41)
Albumin: 3.6 g/dL (ref 3.5–5.0)
Alkaline Phosphatase: 129 U/L — ABNORMAL HIGH (ref 38–126)
Anion gap: 9 (ref 5–15)
BUN: 9 mg/dL (ref 6–20)
CO2: 25 mmol/L (ref 22–32)
Calcium: 9.1 mg/dL (ref 8.9–10.3)
Chloride: 106 mmol/L (ref 98–111)
Creatinine: 0.79 mg/dL (ref 0.61–1.24)
GFR, Est AFR Am: >60 mL/min (ref >60)
GFR, Estimated: >60 mL/min (ref >60)
Glucose, Bld: 99 mg/dL (ref 70–99)
Potassium: 3.9 mmol/L (ref 3.5–5.1)
Sodium: 140 mmol/L (ref 135–145)
Total Bilirubin: 0.3 mg/dL (ref 0.3–1.2)
Total Protein: 7 g/dL (ref 6.5–8.1)

## 2019-11-07 LAB — MAGNESIUM: Magnesium: 1.7 mg/dL (ref 1.7–2.4)

## 2019-11-07 MED ORDER — SODIUM CHLORIDE 0.9% FLUSH
10.0000 mL | INTRAVENOUS | Status: DC | PRN
  Administered 2019-11-07: 10 mL
  Filled 2019-11-07: qty 10

## 2019-11-07 MED ORDER — DIPHENHYDRAMINE HCL 25 MG PO CAPS
ORAL_CAPSULE | ORAL | Status: AC
  Filled 2019-11-07: qty 1

## 2019-11-07 MED ORDER — FAMOTIDINE 20 MG PO TABS
ORAL_TABLET | ORAL | Status: AC
  Filled 2019-11-07: qty 1

## 2019-11-07 MED ORDER — HEPARIN SOD (PORK) LOCK FLUSH 100 UNIT/ML IV SOLN
500.0000 [IU] | Freq: Once | INTRAVENOUS | Status: AC | PRN
  Administered 2019-11-07: 500 [IU]
  Filled 2019-11-07: qty 5

## 2019-11-07 MED ORDER — ACETAMINOPHEN 325 MG PO TABS
ORAL_TABLET | ORAL | Status: AC
  Filled 2019-11-07: qty 2

## 2019-11-07 MED ORDER — SODIUM CHLORIDE 0.9 % IV SOLN
Freq: Once | INTRAVENOUS | Status: AC
  Filled 2019-11-07: qty 250

## 2019-11-07 MED ORDER — DIPHENHYDRAMINE HCL 25 MG PO TABS
25.0000 mg | ORAL_TABLET | Freq: Once | ORAL | Status: AC
  Administered 2019-11-07: 25 mg via ORAL
  Filled 2019-11-07: qty 1

## 2019-11-07 MED ORDER — SODIUM CHLORIDE 0.9 % IV SOLN
1200.0000 mg | Freq: Once | INTRAVENOUS | Status: AC
  Administered 2019-11-07: 1200 mg via INTRAVENOUS
  Filled 2019-11-07: qty 20

## 2019-11-07 MED ORDER — FAMOTIDINE 20 MG PO TABS
20.0000 mg | ORAL_TABLET | Freq: Once | ORAL | Status: AC
  Administered 2019-11-07: 20 mg via ORAL

## 2019-11-07 MED ORDER — ACETAMINOPHEN 325 MG PO TABS
650.0000 mg | ORAL_TABLET | Freq: Once | ORAL | Status: AC
  Administered 2019-11-07: 650 mg via ORAL

## 2019-11-07 NOTE — Progress Notes (Signed)
HEMATOLOGY/ONCOLOGY CLINIC NOTE  Date of Service: 11/07/2019  Patient Care Team: [name], MD as PCP - General (Family Medicine)  CHIEF COMPLAINTS/PURPOSE OF CONSULTATION:  contnued management of Metastatic Poorly differentiated lung adenocarcinoma  HISTORY OF PRESENTING ILLNESS:   David Berry is a 54 year old male with no significant medical history.  He presented to the hospital with progressive right arm and chest pain with new right-sided neck swelling.  The patient states that he was diagnosed with carpal tunnel syndrome received an injection in August.  However, he continued to have progressive pain up and down his right arm that would also radiate to his right chest.  He took ibuprofen with no improvement.  1 week prior to admission, he developed new right sided neck swelling.  He presented to the emergency room for further evaluation.  Work-up in the emergency room was significant for a CT angiogram of the neck and chest which revealed a bulky soft tissue mass in the right neck continuing into the upper chest most compatible with extensive metastatic lymphadenopathy.  This mass measures up to 34 mm and encases multiple vessels including the right upper lobe pulmonary vessels, superior vena cava, and brachiocephalic vessel.  He also had a large poorly defined malignant appearing infiltrative mass measuring 6.2 x 5.6 x 7.9 cm within the right anterior and middle mediastinum with suspected invasion of the distal left brachiocephalic vein and probable tumor occlusion of the right subclavian and jugular veins.  There is also suspected tumor invasion of the upper SVC with string-like narrowing of the SVC but with patency of the SVC just above the right atrium.  The tumor abuts the aorta and great vessels and also results in significant narrowing of the right upper lobe pulmonary arterial vessels.  He also had a CT of the abdomen pelvis which showed a 15 mm heterogeneously enhancing lesion  in the upper pole of the right kidney concerning for renal cell carcinoma, no lymphadenopathy in the abdomen or pelvis, focal hyperenhancement of the anterior left liver most likely related to aberrant venous anatomy/flow, sclerotic lesions in the right acetabulum and left iliac bone are indeterminate.  These are likely bone islands but attention on follow-up is recommended a metastatic disease cannot be excluded.  MRI of the brain with and without contrast did not show any evidence of intracranial metastatic disease. The patient underwent a CT-guided biopsy in interventional radiology on 01/22/2019.  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   When seen today, patient reports ongoing pain and swelling to his right arm and right side of his neck.  Reports ongoing pain to his right chest.  He reports intermittent facial swelling which is worse in the morning and improves throughout the day.  He reports anorexia and a weight loss of 20 pounds over the past few weeks. He has had dizziness for the past few months.  Denies headaches.  He also noted that on the day of admission his peripheral vision was poor.  His vision is now improved.  He denies food getting stuck or difficulty swallowing but does feel as though his throat is "tight."  He reports a nonproductive cough.  Denies fevers and chills.  He is not really having any shortness of breath.  Denies abdominal pain, nausea, vomiting, constipation, diarrhea.  He has not noticed any epistaxis, hemoptysis, hematemesis, hematuria, melena, hematochezia.  The patient is single.  He has 1 daughter who lives  in [place], [place].  Reports occasional alcohol use and currently smokes 3 cigars a day since 1997.  He is currently living in a rooming house with other roommates who all have their own room.  They share a common bathroom and kitchen.  Medical oncology was asked see the  patient to make recommendations regarding his newly diagnosed poorly differentiated carcinoma.  History reviewed. No pertinent past medical history.   Current Treatment:  Atezolizumab maintenance   INTERVAL HISTORY:   David Berry. is a 54 y.o. male who is here for evaluation and management of metastatic poorly differentiated lung adenocarcinoma. He is here for C12 of Atezolizumab. The patient's last visit with [place] was on 10/18/2019. The pt reports that he is doing well overall.  The pt reports that the other night he was lying on his bed when both of his feet and hands began to swell. There were no accompanying swollen joints or pain. The swelling lasted for about 30 minutes. Pt denies any injury to the areas, but had gone fishing earlier in the day. Pt has otherwise felt well and has no new concerns.   Lab results today (11/07/19) of CBC w/diff and CMP is as follows: all values are WNL except for WBC at 3.9K, Hgb at 11.7, HCT at 37.2, AST at 13, ALP at 129. 11/07/2019 Magnesium at 1.7  On review of systems, pt denies fevers, chills, rashes, hand/feet swelling and any other symptoms.    MEDICAL HISTORY:  Past Medical History:  Diagnosis Date  . Blood in stool   . Cancer ([place])   . Chronic bronchitis with emphysema ([place])   . GERD (gastroesophageal reflux disease)   . Lung cancer [facility])     SURGICAL HISTORY: Past Surgical History:  Procedure Laterality Date  . APPENDECTOMY     teenager  . INGUINAL HERNIA REPAIR Right 2016, 2017   [facility] mesh  . IR IMAGING GUIDED PORT INSERTION  04/26/2019    SOCIAL HISTORY: Social History   Socioeconomic History  . Marital status: Single    Spouse name: Not on file  . Number of children: 1  . Years of education: GED  . Highest education level: Not on file  Occupational History    Comment: fork lift driver  Tobacco Use  . Smoking status: Former Smoker    Types: Cigars  . Smokeless tobacco:  Never Used  . Tobacco comment: smokes 3 black and mild cigars per day  Substance and Sexual Activity  . Alcohol use: Not Currently  . Drug use: No  . Sexual activity: Yes  Other Topics Concern  . Not on file  Social History Narrative   Lives alone   Caffeine- coffee, 20 oz daily, tea occas   Social Determinants of Health   Financial Resource Strain:   . Difficulty of Paying Living Expenses:   Food Insecurity:   . Worried About Charity fundraiser in the Last Year:   . Arboriculturist in the Last Year:   Transportation Needs:   . Film/video editor (Medical):   [name] Lack of Transportation (Non-Medical):   Physical Activity:   . Days of Exercise per Week:   . Minutes of Exercise per Session:   Stress:   . Feeling of Stress :   Social Connections:   . Frequency of Communication with Friends and Family:   . Frequency of Social Gatherings with Friends and Family:   . Attends Religious Services:   .  Active Member of Clubs or Organizations:   . Attends Archivist Meetings:   [name] Marital Status:   Intimate Partner Violence:   . Fear of Current or Ex-Partner:   . Emotionally Abused:   [name] Physically Abused:   . Sexually Abused:     FAMILY HISTORY: Family History  Problem Relation Age of Onset  . Prostate cancer Father     ALLERGIES:  is allergic to pregabalin.  MEDICATIONS:  Current Outpatient Medications  Medication Sig Dispense Refill  . amoxicillin-clavulanate (AUGMENTIN) 875-125 MG tablet Take 1 tablet by mouth 2 (two) times daily. (Patient not taking: Reported on 10/18/2019) 20 tablet 0  . ELIQUIS 5 MG TABS tablet TAKE 1 TABLET BY MOUTH TWICE DAILY 60 tablet 11  . LORazepam (ATIVAN) 0.5 MG tablet Take 1 tablet (0.5 mg total) by mouth every 4 (four) hours as needed for anxiety. 60 tablet 0  . omeprazole (PRILOSEC) 40 MG capsule Take 1 capsule (40 mg total) by mouth 2 (two) times daily. 60 capsule 3  . oxyCODONE (OXY IR/ROXICODONE) 5 MG immediate release tablet  Take 1-2 tablets (5-10 mg total) by mouth every 4 (four) hours as needed for severe pain. 60 tablet 0   No current facility-administered medications for this visit.    REVIEW OF SYSTEMS:   A 10+ POINT REVIEW OF SYSTEMS WAS OBTAINED including neurology, dermatology, psychiatry, cardiac, respiratory, lymph, extremities, GI, GU, Musculoskeletal, constitutional, breasts, reproductive, HEENT.  All pertinent positives are noted in the HPI.  All others are negative.   PHYSICAL EXAMINATION: ECOG FS:1 - Symptomatic but completely ambulatory  There were no vitals filed for this visit. Wt Readings from Last 3 Encounters:  10/18/19 137 lb 1.6 oz (62.2 kg)  09/26/19 137 lb 3.2 oz (62.2 kg)  09/05/19 137 lb 1.6 oz (62.2 kg)   There is no height or weight on file to calculate BMI.    Exam was given in a chair   GENERAL:alert, in no acute distress and comfortable SKIN: no acute rashes, no significant lesions EYES: conjunctiva are pink and non-injected, sclera anicteric OROPHARYNX: MMM, no exudates, no oropharyngeal erythema or ulceration NECK: supple, no JVD LYMPH:  no palpable lymphadenopathy in the cervical, axillary or inguinal regions LUNGS: clear to auscultation b/l with normal respiratory effort HEART: regular rate & rhythm ABDOMEN:  normoactive bowel sounds , non tender, not distended. No palpable hepatosplenomegaly.  Extremity: no pedal edema PSYCH: alert & oriented x 3 with fluent speech NEURO: no focal motor/sensory deficits  LABORATORY DATA:  I have reviewed the data as listed  . CBC Latest Ref Rng & Units 10/18/2019 09/26/2019 09/05/2019  WBC 4.0 - 10.5 K/uL 3.6(L) 3.8(L) 3.8(L)  Hemoglobin 13.0 - 17.0 g/dL 11.7(L) 11.9(L) 12.2(L)  Hematocrit 39 - 52 % 36.7(L) 37.3(L) 38.8(L)  Platelets 150 - 400 K/uL 287 273 299    . CMP Latest Ref Rng & Units 10/18/2019 09/26/2019 09/05/2019  Glucose 70 - 99 mg/dL 100(H) 96 99  BUN 6 - 20 mg/dL _0 Creatinine 0.61 - 1.24 mg/dL 0.86 0.85  0.76  Sodium 135 - 145 mmol/L 140 140 139  Potassium 3.5 - 5.1 mmol/L 4.0 4.0 4.3  Chloride 98 - 111 mmol/L 106 107 105  CO2 22 - 32 mmol/L _1 Calcium 8.9 - 10.3 mg/dL 9.3 9.4 9.4  Total Protein 6.5 - 8.1 g/dL 7.0 7.0 7.1  Total Bilirubin 0.3 - 1.2 mg/dL 0.4 0.3 0.3  Alkaline Phos 38 - 126 U/L  135(H) 122 140(H)  AST 15 - 41 U/L 13(L) 13(L) 15  ALT 0 - 44 U/L _0 01/22/2019 [facility]: Tumor Mutational Burden     01/22/2019 PD-L1 Immunohistochemistry Analysis    01/22/2019 Soft Tissue Needle Core Biopsy Surgical Pathology     RADIOGRAPHIC STUDIES: I have personally reviewed the radiological images as listed and agreed with the findings in the report. CT Chest W Contrast  Result Date: 10/10/2019 CLINICAL DATA:  Non-small cell lung cancer, metastatic. Assess treatment response. Post chemo radiotherapy and ongoing immunotherapy without current complaints. EXAM: CT CHEST WITH CONTRAST TECHNIQUE: Multidetector CT imaging of the chest was performed during intravenous contrast administration. CONTRAST:  85m OMNIPAQUE IOHEXOL 300 MG/ML  SOLN COMPARISON:  01/20/2019 FINDINGS: Cardiovascular: Studies acquired at a slightly different phase than the previous examination. Engorged external jugular anterior jugular veins on the LEFT. Similar appearance. No sign of aneurysmal dilation of the thoracic aorta. Heart size is normal without pericardial effusion. Partial encasement of the superior vena cava with marked improvement since the previous study where there was completed encasement. 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 Soft tissue occlusion of the superior vena cava is diminished. On image 69 of series 2 this measures 2.6 x 2.2 cm, previously approximately 6.3 x 5.6 cm. A discrete channel of contrast is seen flowing from LEFT brachiocephalic in the superior vena cava, this could not be delineated without clear evidence of filling defects compatible with  invasion on the previous exam this is less conspicuous on the current study as well. Central pulmonary vasculature is unremarkable. Mediastinum/Nodes: Soft tissue throughout the mediastinum diminished compared to the previous study as outlined above. RIGHT hilar nodal mass diminished in size measuring approximately 18 x 9 mm previously approximately 3.0 x 2.8 cm. Mural stratification of the esophagus and patulous appearance with fluid in the lumen. Lungs/Pleura: Paramediastinal post radiation change with consolidative changes along the mediastinal border of the superior mediastinum on the LEFT and RIGHT, associated with bronchiectasis. No new or suspicious pulmonary nodule. Upper Abdomen: Incidental imaging of upper abdominal contents with limited assessment. No acute process. Adrenal glands are normal. Musculoskeletal: No acute musculoskeletal process. IMPRESSION: 1. Diminished mediastinal soft tissue and vascular involvement as described, still with occlusion of the RIGHT brachiocephalic vein. 2. Decreased size of RIGHT hilar nodal mass. 3. Paramediastinal post radiation change with consolidative changes along the mediastinal border of the LEFT and RIGHT, associated with bronchiectasis. Findings are similar to the previous study. 4. Mural stratification of the esophagus and patulous appearance with fluid in the lumen. Correlate with any clinical evidence of esophagitis, perhaps worse when compared to previous imaging. 5. Aortic atherosclerosis. Aortic Atherosclerosis (ICD10-I70.0). Electronically Signed   By: GZetta BillsM.D.   On: 10/10/2019 16:30    ASSESSMENT & PLAN:   This is a 54year old malewith  1. Metastatic Poorly differentiated lung adenocarcinoma Presented with Large neck mass, mediastinal mass, renal mass, and questionable bone lesions no brain mets on MRI brain 01/22/2019 PD-L1 Immunohistochemistry Analysis which revealed "Tumor Proportion Score (TPS) 50%" 05/16/2019 PET/CT  ([id number] revealed "Radiation changes in the right hemithorax, as above. Improving mediastinal nodal metastases. Prior bulky right cervical metastases have resolved. No evidence of metastatic disease in the abdomen/pelvis." 07/19/2019 Esophagus Scan ([id number] revealed "Mild stricture at the C6-7 level likely related to prior esophageal surgery. Barium tablet was slow to pass through this area but did pass through after 1 minutes. Hiatal hernia with moderate gastroesophageal reflux. Moderate  stricture above the hiatal hernia likely due to reflux. Barium tablet did not pass this area. There are changes of esophagitis which are likely due to reflux and possibly radiation as well."  10/10/19 of  CT Chest W Contrast ([id number])- no evidence of lung cancer progression at this time.   2. 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 Acute DVT- Right IJ, Right Innominate Vein, and likely also the SVC- related to malignancy+ tobacco-  on long term Eliquis  4. Symptomatic hemorrhoids  5. Normocytic anemia -Likely due to underlying malignancy -Will check ferritin, iron studies, vitamin B12 level, and RBC folate in the a.m.  6.Thrombocytosis -- likely due to paraneoplastic effect of tumor and reactive due to inflammation and tissue inflammation from RT.-- now resolved.  7. Nicotine dependence -He was counseled about tobacco cessation. -has quit since cancer diagnosis.  PLAN:  -Discussed pt labwork today, 11/07/19; blood counts and chemistries are steady, Magnesium is WNL  -The pt has no prohibitive toxicities from continuing C12D1 of Atezolizumab at this time -Will continue Atezolizumab until progression or intolerance. -Will see back with every other treatment    FOLLOW UP: Please schedule next 4 cycles of Atezolizumab with portflush and labs MD visit every other treatment   The total time spent in the appt was 20 minutes and more than 50% was on counseling and direct patient  cares.  All of the patient's questions were answered with apparent satisfaction. The patient knows to call the clinic with any problems, questions or concerns.   Sullivan Lone MD [place] AAHIVMS [facility] Hematology/Oncology Physician [facility]  (Office):       [phone] (Work cell):  [phone] (Fax):           [phone]  11/07/2019 7:45 AM  I, Yevette Edwards, am acting as a scribe for Dr. Sullivan Lone.   .I have reviewed the above documentation for accuracy and completeness, and I agree with the above. Brunetta Genera MD

## 2019-11-07 NOTE — Patient Instructions (Signed)
[place] Discharge Instructions for Patients Receiving Chemotherapy  Today you received the following chemotherapy agents: atezolizumab.  To help prevent nausea and vomiting after your treatment, we encourage you to take your nausea medication as directed.   If you develop nausea and vomiting that is not controlled by your nausea medication, call the clinic.   BELOW ARE SYMPTOMS THAT SHOULD BE REPORTED IMMEDIATELY:  *FEVER GREATER THAN 100.5 F  *CHILLS WITH OR WITHOUT FEVER  NAUSEA AND VOMITING THAT IS NOT CONTROLLED WITH YOUR NAUSEA MEDICATION  *UNUSUAL SHORTNESS OF BREATH  *UNUSUAL BRUISING OR BLEEDING  TENDERNESS IN MOUTH AND THROAT WITH OR WITHOUT PRESENCE OF ULCERS  *URINARY PROBLEMS  *BOWEL PROBLEMS  UNUSUAL RASH Items with * indicate a potential emergency and should be followed up as soon as possible.  Feel free to call the clinic should you have any questions or concerns. The clinic phone number is [phone].  Please show the [facility] at check-in to the Emergency Department and triage nurse.

## 2019-11-07 NOTE — Patient Instructions (Signed)

## 2019-11-09 ENCOUNTER — Other Ambulatory Visit: Payer: Self-pay | Admitting: Medical

## 2019-11-09 MED ORDER — OXYCODONE HCL 5 MG PO TABS: 5.0000 mg | ORAL_TABLET | ORAL | 0 refills | Status: DC | PRN

## 2019-11-23 ENCOUNTER — Other Ambulatory Visit: Payer: Self-pay | Admitting: Hematology

## 2019-11-26 ENCOUNTER — Telehealth: Payer: Self-pay | Admitting: Hematology

## 2019-11-26 NOTE — Telephone Encounter (Signed)
Scheduled per los, patient has been called and notified. 

## 2019-11-28 ENCOUNTER — Other Ambulatory Visit: Payer: Self-pay | Admitting: Hematology

## 2019-11-28 ENCOUNTER — Inpatient Hospital Stay: Payer: Medicaid Other

## 2019-11-28 ENCOUNTER — Ambulatory Visit: Payer: Medicaid Other | Admitting: Hematology

## 2019-11-28 ENCOUNTER — Other Ambulatory Visit: Payer: Self-pay

## 2019-11-28 ENCOUNTER — Inpatient Hospital Stay: Payer: Medicaid Other | Attending: Hematology

## 2019-11-28 VITALS — BP 104/77 | HR 74 | Temp 98.1°F | Resp 18 | Wt 136.0 lb

## 2019-11-28 DIAGNOSIS — Z5112 Encounter for antineoplastic immunotherapy: Secondary | ICD-10-CM | POA: Insufficient documentation

## 2019-11-28 DIAGNOSIS — Z7189 Other specified counseling: Secondary | ICD-10-CM

## 2019-11-28 DIAGNOSIS — C3411 Malignant neoplasm of upper lobe, right bronchus or lung: Secondary | ICD-10-CM | POA: Diagnosis present

## 2019-11-28 DIAGNOSIS — C3491 Malignant neoplasm of unspecified part of right bronchus or lung: Secondary | ICD-10-CM

## 2019-11-28 DIAGNOSIS — Z5111 Encounter for antineoplastic chemotherapy: Secondary | ICD-10-CM

## 2019-11-28 DIAGNOSIS — K0889 Other specified disorders of teeth and supporting structures: Secondary | ICD-10-CM

## 2019-11-28 DIAGNOSIS — Z95828 Presence of other vascular implants and grafts: Secondary | ICD-10-CM

## 2019-11-28 LAB — CBC WITH DIFFERENTIAL/PLATELET
Abs Immature Granulocytes: 0.01 10*3/uL (ref 0.00–0.07)
Basophils Absolute: 0 10*3/uL (ref 0.0–0.1)
Basophils Relative: 0 %
Eosinophils Absolute: 0 10*3/uL (ref 0.0–0.5)
Eosinophils Relative: 1 %
HCT: 37.6 % — ABNORMAL LOW (ref 39.0–52.0)
Hemoglobin: 11.8 g/dL — ABNORMAL LOW (ref 13.0–17.0)
Immature Granulocytes: 0 %
Lymphocytes Relative: 14 %
Lymphs Abs: 0.8 10*3/uL (ref 0.7–4.0)
MCH: 27 pg (ref 26.0–34.0)
MCHC: 31.4 g/dL (ref 30.0–36.0)
MCV: 86 fL (ref 80.0–100.0)
Monocytes Absolute: 0.6 10*3/uL (ref 0.1–1.0)
Monocytes Relative: 11 %
Neutro Abs: 4 10*3/uL (ref 1.7–7.7)
Neutrophils Relative %: 74 %
Platelets: 244 10*3/uL (ref 150–400)
RBC: 4.37 MIL/uL (ref 4.22–5.81)
RDW: 13.9 % (ref 11.5–15.5)
WBC: 5.5 10*3/uL (ref 4.0–10.5)
nRBC: 0 % (ref 0.0–0.2)

## 2019-11-28 LAB — CMP (CANCER CENTER ONLY)
ALT: 9 U/L (ref 0–44)
AST: 14 U/L — ABNORMAL LOW (ref 15–41)
Albumin: 3.7 g/dL (ref 3.5–5.0)
Alkaline Phosphatase: 128 U/L — ABNORMAL HIGH (ref 38–126)
Anion gap: 9 (ref 5–15)
BUN: 9 mg/dL (ref 6–20)
CO2: 25 mmol/L (ref 22–32)
Calcium: 9.5 mg/dL (ref 8.9–10.3)
Chloride: 106 mmol/L (ref 98–111)
Creatinine: 0.8 mg/dL (ref 0.61–1.24)
GFR, Est AFR Am: >60 mL/min (ref >60)
GFR, Estimated: >60 mL/min (ref >60)
Glucose, Bld: 92 mg/dL (ref 70–99)
Potassium: 3.8 mmol/L (ref 3.5–5.1)
Sodium: 140 mmol/L (ref 135–145)
Total Bilirubin: 0.3 mg/dL (ref 0.3–1.2)
Total Protein: 6.9 g/dL (ref 6.5–8.1)

## 2019-11-28 LAB — MAGNESIUM: Magnesium: 2 mg/dL (ref 1.7–2.4)

## 2019-11-28 MED ORDER — ACETAMINOPHEN 325 MG PO TABS
ORAL_TABLET | ORAL | Status: AC
  Filled 2019-11-28: qty 2

## 2019-11-28 MED ORDER — HEPARIN SOD (PORK) LOCK FLUSH 100 UNIT/ML IV SOLN
500.0000 [IU] | Freq: Once | INTRAVENOUS | Status: AC | PRN
  Administered 2019-11-28: 500 [IU]
  Filled 2019-11-28: qty 5

## 2019-11-28 MED ORDER — DIPHENHYDRAMINE HCL 25 MG PO TABS
25.0000 mg | ORAL_TABLET | Freq: Once | ORAL | Status: AC
  Administered 2019-11-28: 25 mg via ORAL
  Filled 2019-11-28: qty 1

## 2019-11-28 MED ORDER — FAMOTIDINE 20 MG PO TABS
ORAL_TABLET | ORAL | Status: AC
  Filled 2019-11-28: qty 1

## 2019-11-28 MED ORDER — SODIUM CHLORIDE 0.9 % IV SOLN
Freq: Once | INTRAVENOUS | Status: AC
  Filled 2019-11-28: qty 250

## 2019-11-28 MED ORDER — SODIUM CHLORIDE 0.9 % IV SOLN
1200.0000 mg | Freq: Once | INTRAVENOUS | Status: AC
  Administered 2019-11-28: 1200 mg via INTRAVENOUS
  Filled 2019-11-28: qty 20

## 2019-11-28 MED ORDER — SODIUM CHLORIDE 0.9% FLUSH
10.0000 mL | INTRAVENOUS | Status: DC | PRN
  Administered 2019-11-28: 10 mL
  Filled 2019-11-28: qty 10

## 2019-11-28 MED ORDER — FAMOTIDINE 20 MG PO TABS
20.0000 mg | ORAL_TABLET | Freq: Once | ORAL | Status: AC
  Administered 2019-11-28: 20 mg via ORAL

## 2019-11-28 MED ORDER — DIPHENHYDRAMINE HCL 25 MG PO CAPS
ORAL_CAPSULE | ORAL | Status: AC
  Filled 2019-11-28: qty 1

## 2019-11-28 MED ORDER — ACETAMINOPHEN 325 MG PO TABS
650.0000 mg | ORAL_TABLET | Freq: Once | ORAL | Status: AC
  Administered 2019-11-28: 650 mg via ORAL

## 2019-11-28 NOTE — Patient Instructions (Signed)

## 2019-11-28 NOTE — Patient Instructions (Signed)
[place] Discharge Instructions for Patients Receiving Chemotherapy  Today you received the following chemotherapy agents: atezolizumab.  To help prevent nausea and vomiting after your treatment, we encourage you to take your nausea medication as directed.   If you develop nausea and vomiting that is not controlled by your nausea medication, call the clinic.   BELOW ARE SYMPTOMS THAT SHOULD BE REPORTED IMMEDIATELY:  *FEVER GREATER THAN 100.5 F  *CHILLS WITH OR WITHOUT FEVER  NAUSEA AND VOMITING THAT IS NOT CONTROLLED WITH YOUR NAUSEA MEDICATION  *UNUSUAL SHORTNESS OF BREATH  *UNUSUAL BRUISING OR BLEEDING  TENDERNESS IN MOUTH AND THROAT WITH OR WITHOUT PRESENCE OF ULCERS  *URINARY PROBLEMS  *BOWEL PROBLEMS  UNUSUAL RASH Items with * indicate a potential emergency and should be followed up as soon as possible.  Feel free to call the clinic should you have any questions or concerns. The clinic phone number is [phone].  Please show the [facility] at check-in to the Emergency Department and triage nurse.

## 2019-12-03 ENCOUNTER — Telehealth: Payer: Self-pay | Admitting: *Deleted

## 2019-12-03 NOTE — Telephone Encounter (Signed)
Dr. Enrique Sack called office. He recommended referring patient to Dr. Diona Browner, DDS/Oral Surgeon for tooth extraction. (phone [phone])  Dr. Irene Limbo informed of receomendation

## 2019-12-12 ENCOUNTER — Telehealth: Payer: Self-pay | Admitting: *Deleted

## 2019-12-12 NOTE — Telephone Encounter (Signed)
Per Dr. Ritta Slot recommendation - Dr. Irene Limbo referred patient to Dr. Hoyt Koch for evaluation for oral concerns.  Contacted Dr. Lupita Leash office [phone] - they requested referral and Dr. Grier Mitts last office visit note. Faxed to [phone] per directions from Dr.Jensen's staff. Fax confirmation received

## 2019-12-14 ENCOUNTER — Other Ambulatory Visit: Payer: Self-pay | Admitting: *Deleted

## 2019-12-14 ENCOUNTER — Telehealth: Payer: Self-pay | Admitting: *Deleted

## 2019-12-14 DIAGNOSIS — C3491 Malignant neoplasm of unspecified part of right bronchus or lung: Secondary | ICD-10-CM

## 2019-12-14 MED ORDER — ESOMEPRAZOLE MAGNESIUM 40 MG PO CPDR: 40.0000 mg | DELAYED_RELEASE_CAPSULE | Freq: Every day | ORAL | 1 refills | Status: DC

## 2019-12-14 NOTE — Telephone Encounter (Signed)
Patient requested refill of Esomeprazole - states Dr. Irene Limbo last prescribed it in May. He states that it works better than the Omeprazole prescribed earlier in the spring by Dr. Tarri Glenn.

## 2019-12-19 ENCOUNTER — Other Ambulatory Visit: Payer: Medicaid Other

## 2019-12-19 ENCOUNTER — Inpatient Hospital Stay: Payer: Medicaid Other

## 2019-12-19 ENCOUNTER — Other Ambulatory Visit: Payer: Self-pay

## 2019-12-19 ENCOUNTER — Inpatient Hospital Stay: Payer: Medicaid Other | Attending: Hematology

## 2019-12-19 ENCOUNTER — Ambulatory Visit: Payer: Medicaid Other

## 2019-12-19 ENCOUNTER — Inpatient Hospital Stay (HOSPITAL_BASED_OUTPATIENT_CLINIC_OR_DEPARTMENT_OTHER): Payer: Medicaid Other | Admitting: Hematology

## 2019-12-19 VITALS — BP 98/81 | HR 81 | Temp 95.7°F | Resp 18 | Ht 71.0 in | Wt 136.7 lb

## 2019-12-19 DIAGNOSIS — C3491 Malignant neoplasm of unspecified part of right bronchus or lung: Secondary | ICD-10-CM

## 2019-12-19 DIAGNOSIS — Z5111 Encounter for antineoplastic chemotherapy: Secondary | ICD-10-CM

## 2019-12-19 DIAGNOSIS — C3411 Malignant neoplasm of upper lobe, right bronchus or lung: Secondary | ICD-10-CM | POA: Insufficient documentation

## 2019-12-19 DIAGNOSIS — Z8042 Family history of malignant neoplasm of prostate: Secondary | ICD-10-CM | POA: Insufficient documentation

## 2019-12-19 DIAGNOSIS — D649 Anemia, unspecified: Secondary | ICD-10-CM | POA: Diagnosis not present

## 2019-12-19 DIAGNOSIS — I82621 Acute embolism and thrombosis of deep veins of right upper extremity: Secondary | ICD-10-CM

## 2019-12-19 DIAGNOSIS — Z923 Personal history of irradiation: Secondary | ICD-10-CM | POA: Insufficient documentation

## 2019-12-19 DIAGNOSIS — Z86718 Personal history of other venous thrombosis and embolism: Secondary | ICD-10-CM | POA: Insufficient documentation

## 2019-12-19 DIAGNOSIS — Z7901 Long term (current) use of anticoagulants: Secondary | ICD-10-CM | POA: Insufficient documentation

## 2019-12-19 DIAGNOSIS — Z7189 Other specified counseling: Secondary | ICD-10-CM

## 2019-12-19 DIAGNOSIS — Z87891 Personal history of nicotine dependence: Secondary | ICD-10-CM | POA: Insufficient documentation

## 2019-12-19 DIAGNOSIS — Z5112 Encounter for antineoplastic immunotherapy: Secondary | ICD-10-CM | POA: Insufficient documentation

## 2019-12-19 DIAGNOSIS — Z79899 Other long term (current) drug therapy: Secondary | ICD-10-CM | POA: Diagnosis not present

## 2019-12-19 DIAGNOSIS — Z95828 Presence of other vascular implants and grafts: Secondary | ICD-10-CM

## 2019-12-19 LAB — CBC WITH DIFFERENTIAL/PLATELET
Abs Immature Granulocytes: 0.01 10*3/uL (ref 0.00–0.07)
Basophils Absolute: 0 10*3/uL (ref 0.0–0.1)
Basophils Relative: 1 %
Eosinophils Absolute: 0.1 10*3/uL (ref 0.0–0.5)
Eosinophils Relative: 2 %
HCT: 37.4 % — ABNORMAL LOW (ref 39.0–52.0)
Hemoglobin: 11.8 g/dL — ABNORMAL LOW (ref 13.0–17.0)
Immature Granulocytes: 0 %
Lymphocytes Relative: 24 %
Lymphs Abs: 0.9 10*3/uL (ref 0.7–4.0)
MCH: 26.6 pg (ref 26.0–34.0)
MCHC: 31.6 g/dL (ref 30.0–36.0)
MCV: 84.4 fL (ref 80.0–100.0)
Monocytes Absolute: 0.5 10*3/uL (ref 0.1–1.0)
Monocytes Relative: 13 %
Neutro Abs: 2.4 10*3/uL (ref 1.7–7.7)
Neutrophils Relative %: 60 %
Platelets: 262 10*3/uL (ref 150–400)
RBC: 4.43 MIL/uL (ref 4.22–5.81)
RDW: 14.6 % (ref 11.5–15.5)
WBC: 3.9 10*3/uL — ABNORMAL LOW (ref 4.0–10.5)
nRBC: 0 % (ref 0.0–0.2)

## 2019-12-19 LAB — TSH: TSH: 0.714 u[IU]/mL (ref 0.320–4.118)

## 2019-12-19 LAB — CMP (CANCER CENTER ONLY)
ALT: 10 U/L (ref 0–44)
AST: 13 U/L — ABNORMAL LOW (ref 15–41)
Albumin: 3.9 g/dL (ref 3.5–5.0)
Alkaline Phosphatase: 137 U/L — ABNORMAL HIGH (ref 38–126)
Anion gap: 7 (ref 5–15)
BUN: 10 mg/dL (ref 6–20)
CO2: 27 mmol/L (ref 22–32)
Calcium: 10.1 mg/dL (ref 8.9–10.3)
Chloride: 106 mmol/L (ref 98–111)
Creatinine: 0.82 mg/dL (ref 0.61–1.24)
GFR, Est AFR Am: >60 mL/min (ref >60)
GFR, Estimated: >60 mL/min (ref >60)
Glucose, Bld: 95 mg/dL (ref 70–99)
Potassium: 4.2 mmol/L (ref 3.5–5.1)
Sodium: 140 mmol/L (ref 135–145)
Total Bilirubin: 0.3 mg/dL (ref 0.3–1.2)
Total Protein: 7.2 g/dL (ref 6.5–8.1)

## 2019-12-19 LAB — MAGNESIUM: Magnesium: 1.8 mg/dL (ref 1.7–2.4)

## 2019-12-19 MED ORDER — HEPARIN SOD (PORK) LOCK FLUSH 100 UNIT/ML IV SOLN
500.0000 [IU] | Freq: Once | INTRAVENOUS | Status: AC | PRN
  Administered 2019-12-19: 500 [IU]
  Filled 2019-12-19: qty 5

## 2019-12-19 MED ORDER — SODIUM CHLORIDE 0.9% FLUSH
10.0000 mL | INTRAVENOUS | Status: DC | PRN
  Administered 2019-12-19: 10 mL
  Filled 2019-12-19: qty 10

## 2019-12-19 MED ORDER — ACETAMINOPHEN 325 MG PO TABS
650.0000 mg | ORAL_TABLET | Freq: Once | ORAL | Status: AC
  Administered 2019-12-19: 650 mg via ORAL

## 2019-12-19 MED ORDER — FAMOTIDINE 20 MG PO TABS
ORAL_TABLET | ORAL | Status: AC
  Filled 2019-12-19: qty 1

## 2019-12-19 MED ORDER — DIPHENHYDRAMINE HCL 25 MG PO CAPS
ORAL_CAPSULE | ORAL | Status: AC
  Filled 2019-12-19: qty 1

## 2019-12-19 MED ORDER — SODIUM CHLORIDE 0.9 % IV SOLN
Freq: Once | INTRAVENOUS | Status: AC
  Filled 2019-12-19: qty 250

## 2019-12-19 MED ORDER — DIPHENHYDRAMINE HCL 25 MG PO TABS
25.0000 mg | ORAL_TABLET | Freq: Once | ORAL | Status: AC
  Administered 2019-12-19: 25 mg via ORAL
  Filled 2019-12-19: qty 1

## 2019-12-19 MED ORDER — ACETAMINOPHEN 325 MG PO TABS
ORAL_TABLET | ORAL | Status: AC
  Filled 2019-12-19: qty 2

## 2019-12-19 MED ORDER — FAMOTIDINE 20 MG PO TABS
20.0000 mg | ORAL_TABLET | Freq: Once | ORAL | Status: AC
  Administered 2019-12-19: 20 mg via ORAL

## 2019-12-19 MED ORDER — SODIUM CHLORIDE 0.9 % IV SOLN
1200.0000 mg | Freq: Once | INTRAVENOUS | Status: AC
  Administered 2019-12-19: 1200 mg via INTRAVENOUS
  Filled 2019-12-19: qty 20

## 2019-12-19 MED ORDER — FAMOTIDINE IN NACL 20-0.9 MG/50ML-% IV SOLN
INTRAVENOUS | Status: AC
  Filled 2019-12-19: qty 50

## 2019-12-19 NOTE — Progress Notes (Signed)
HEMATOLOGY/ONCOLOGY CLINIC NOTE  Date of Service: 12/19/2019  Patient Care Team: [name], MD as PCP - General (Family Medicine)  CHIEF COMPLAINTS/PURPOSE OF CONSULTATION:  contnued management of Metastatic Poorly differentiated lung adenocarcinoma  HISTORY OF PRESENTING ILLNESS:   David Berry is a 54 year old male with no significant medical history.  He presented to the hospital with progressive right arm and chest pain with new right-sided neck swelling.  The patient states that he was diagnosed with carpal tunnel syndrome received an injection in August.  However, he continued to have progressive pain up and down his right arm that would also radiate to his right chest.  He took ibuprofen with no improvement.  1 week prior to admission, he developed new right sided neck swelling.  He presented to the emergency room for further evaluation.  Work-up in the emergency room was significant for a CT angiogram of the neck and chest which revealed a bulky soft tissue mass in the right neck continuing into the upper chest most compatible with extensive metastatic lymphadenopathy.  This mass measures up to 34 mm and encases multiple vessels including the right upper lobe pulmonary vessels, superior vena cava, and brachiocephalic vessel.  He also had a large poorly defined malignant appearing infiltrative mass measuring 6.2 x 5.6 x 7.9 cm within the right anterior and middle mediastinum with suspected invasion of the distal left brachiocephalic vein and probable tumor occlusion of the right subclavian and jugular veins.  There is also suspected tumor invasion of the upper SVC with string-like narrowing of the SVC but with patency of the SVC just above the right atrium.  The tumor abuts the aorta and great vessels and also results in significant narrowing of the right upper lobe pulmonary arterial vessels.  He also had a CT of the abdomen pelvis which showed a 15 mm heterogeneously enhancing lesion  in the upper pole of the right kidney concerning for renal cell carcinoma, no lymphadenopathy in the abdomen or pelvis, focal hyperenhancement of the anterior left liver most likely related to aberrant venous anatomy/flow, sclerotic lesions in the right acetabulum and left iliac bone are indeterminate.  These are likely bone islands but attention on follow-up is recommended a metastatic disease cannot be excluded.  MRI of the brain with and without contrast did not show any evidence of intracranial metastatic disease. The patient underwent a CT-guided biopsy in interventional radiology on 01/22/2019.  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   When seen today, patient reports ongoing pain and swelling to his right arm and right side of his neck.  Reports ongoing pain to his right chest.  He reports intermittent facial swelling which is worse in the morning and improves throughout the day.  He reports anorexia and a weight loss of 20 pounds over the past few weeks. He has had dizziness for the past few months.  Denies headaches.  He also noted that on the day of admission his peripheral vision was poor.  His vision is now improved.  He denies food getting stuck or difficulty swallowing but does feel as though his throat is "tight."  He reports a nonproductive cough.  Denies fevers and chills.  He is not really having any shortness of breath.  Denies abdominal pain, nausea, vomiting, constipation, diarrhea.  He has not noticed any epistaxis, hemoptysis, hematemesis, hematuria, melena, hematochezia.  The patient is single.  He has 1 daughter who lives  in [place], [place].  Reports occasional alcohol use and currently smokes 3 cigars a day since 1997.  He is currently living in a rooming house with other roommates who all have their own room.  They share a common bathroom and kitchen.  Medical oncology was asked see the  patient to make recommendations regarding his newly diagnosed poorly differentiated carcinoma.  History reviewed. No pertinent past medical history.   Current Treatment:  Atezolizumab maintenance   INTERVAL HISTORY:   David Grave. is a 54 y.o. male who is here for evaluation and management of metastatic poorly differentiated lung adenocarcinoma. He is here for C14 of Atezolizumab. The patient's last visit with [place] was on 11/07/2019. The pt reports that he is doing well overall.  The pt reports that he has new boils along his left flank, between his legs, and along his upper lip. He is working with a Pharmacist, community and is planning on having multiple teeth extracted. Pt is interested in going back to work.   Lab results today (12/19/19) of CBC w/diff and CMP is as follows: all values are WNL except for WBC at 3.9K, Hgb at 11.8, HCT at 37.4, AST at 13, ALP at 137. 12/19/2019 TSH at 0.714 12/19/2019 Magnesium at 1.8  On review of systems, pt reports boils and denies any other symptoms.   MEDICAL HISTORY:  Past Medical History:  Diagnosis Date  . Blood in stool   . Cancer ([place])   . Chronic bronchitis with emphysema ([place])   . GERD (gastroesophageal reflux disease)   . Lung cancer [facility])     SURGICAL HISTORY: Past Surgical History:  Procedure Laterality Date  . APPENDECTOMY     teenager  . INGUINAL HERNIA REPAIR Right 2016, 2017   [facility], 2018 [facility] mesh  . IR IMAGING GUIDED PORT INSERTION  04/26/2019    SOCIAL HISTORY: Social History   Socioeconomic History  . Marital status: Single    Spouse name: Not on file  . Number of children: 1  . Years of education: GED  . Highest education level: Not on file  Occupational History    Comment: fork lift driver  Tobacco Use  . Smoking status: Former Smoker    Types: Cigars  . Smokeless tobacco: Never Used  . Tobacco comment: smokes 3 black and mild cigars per day  Substance and Sexual Activity  . Alcohol  use: Not Currently  . Drug use: No  . Sexual activity: Yes  Other Topics Concern  . Not on file  Social History Narrative   Lives alone   Caffeine- coffee, 20 oz daily, tea occas   Social Determinants of Health   Financial Resource Strain:   . Difficulty of Paying Living Expenses:   Food Insecurity:   . Worried About Charity fundraiser in the Last Year:   . Arboriculturist in the Last Year:   Transportation Needs:   . Film/video editor (Medical):   [name] Lack of Transportation (Non-Medical):   Physical Activity:   . Days of Exercise per Week:   . Minutes of Exercise per Session:   Stress:   . Feeling of Stress :   Social Connections:   . Frequency of Communication with Friends and Family:   . Frequency of Social Gatherings with Friends and Family:   . Attends Religious Services:   . Active Member of Clubs or Organizations:   . Attends Archivist Meetings:   [name] Marital Status:  Intimate Partner Violence:   . Fear of Current or Ex-Partner:   . Emotionally Abused:   [name] Physically Abused:   . Sexually Abused:     FAMILY HISTORY: Family History  Problem Relation Age of Onset  . Prostate cancer Father     ALLERGIES:  is allergic to pregabalin.  MEDICATIONS:  Current Outpatient Medications  Medication Sig Dispense Refill  . amoxicillin (AMOXIL) 500 MG tablet Take 500 mg by mouth 4 (four) times daily.    [name] amoxicillin-clavulanate (AUGMENTIN) 875-125 MG tablet Take 1 tablet by mouth 2 (two) times daily. (Patient not taking: Reported on 10/18/2019) 20 tablet 0  . ELIQUIS 5 MG TABS tablet TAKE 1 TABLET BY MOUTH TWICE DAILY 60 tablet 11  . esomeprazole (NEXIUM) 40 MG capsule Take 1 capsule (40 mg total) by mouth daily. 30 capsule 1  . ibuprofen (ADVIL) 800 MG tablet Take 800 mg by mouth 3 (three) times daily.    [name] LORazepam (ATIVAN) 0.5 MG tablet Take 1 tablet (0.5 mg total) by mouth every 4 (four) hours as needed for anxiety. 60 tablet 0  . oxyCODONE (OXY  IR/ROXICODONE) 5 MG immediate release tablet Take 1-2 tablets (5-10 mg total) by mouth every 4 (four) hours as needed for severe pain. 60 tablet 0   No current facility-administered medications for this visit.   Facility-Administered Medications Ordered in Other Visits  Medication Dose Route Frequency Provider Last Rate Last Admin  . 0.9 %  sodium chloride infusion   Intravenous Once David Genera, MD      . acetaminophen (TYLENOL) tablet 650 mg  650 mg Oral Once David Genera, MD      . [facility]) 1,200 mg in sodium chloride 0.9 % 250 mL chemo infusion  1,200 mg Intravenous Once David Genera, MD      . diphenhydrAMINE (BENADRYL) tablet 25 mg  25 mg Oral Once David Genera, MD      . famotidine (PEPCID) tablet 20 mg  20 mg Oral Once David Genera, MD      . heparin lock flush 100 unit/mL  500 Units Intracatheter Once PRN David Genera, MD      . sodium chloride flush (NS) 0.9 % injection 10 mL  10 mL Intracatheter PRN David Genera, MD        REVIEW OF SYSTEMS:   A 10+ POINT REVIEW OF SYSTEMS WAS OBTAINED including neurology, dermatology, psychiatry, cardiac, respiratory, lymph, extremities, GI, GU, Musculoskeletal, constitutional, breasts, reproductive, HEENT.  All pertinent positives are noted in the HPI.  All others are negative.   PHYSICAL EXAMINATION: ECOG FS:1 - Symptomatic but completely ambulatory  Vitals:   12/19/19 0915  BP: 98/81  Pulse: 81  Resp: 18  Temp: (!) 95.7 F (35.4 C)  SpO2: 99%   Wt Readings from Last 3 Encounters:  12/19/19 136 lb 11.2 oz (62 kg)  11/28/19 136 lb (61.7 kg)  11/07/19 136 lb 8 oz (61.9 kg)   Body mass index is 19.07 kg/m.    GENERAL:alert, in no acute distress and comfortable SKIN: no acute rashes, no significant lesions EYES: conjunctiva are pink and non-injected, sclera anicteric OROPHARYNX: MMM, no exudates, no oropharyngeal erythema or ulceration NECK: supple, no  JVD LYMPH:  no palpable lymphadenopathy in the cervical, axillary or inguinal regions LUNGS: clear to auscultation b/l with normal respiratory effort HEART: regular rate & rhythm ABDOMEN:  normoactive bowel sounds , non tender, not distended. No palpable hepatosplenomegaly.  Extremity: no  pedal edema PSYCH: alert & oriented x 3 with fluent speech NEURO: no focal motor/sensory deficits  LABORATORY DATA:  I have reviewed the data as listed  . CBC Latest Ref Rng & Units 12/19/2019 11/28/2019 11/07/2019  WBC 4.0 - 10.5 K/uL 3.9(L) 5.5 3.9(L)  Hemoglobin 13.0 - 17.0 g/dL 11.8(L) 11.8(L) 11.7(L)  Hematocrit 39 - 52 % 37.4(L) 37.6(L) 37.2(L)  Platelets 150 - 400 K/uL 262 244 294    . CMP Latest Ref Rng & Units 12/19/2019 11/28/2019 11/07/2019  Glucose 70 - 99 mg/dL 95 92 99  BUN 6 - 20 mg/dL _0 Creatinine 0.61 - 1.24 mg/dL 0.82 0.80 0.79  Sodium 135 - 145 mmol/L 140 140 140  Potassium 3.5 - 5.1 mmol/L 4.2 3.8 3.9  Chloride 98 - 111 mmol/L 106 106 106  CO2 22 - 32 mmol/L _1 Calcium 8.9 - 10.3 mg/dL 10.1 9.5 9.1  Total Protein 6.5 - 8.1 g/dL 7.2 6.9 7.0  Total Bilirubin 0.3 - 1.2 mg/dL 0.3 0.3 0.3  Alkaline Phos 38 - 126 U/L 137(H) 128(H) 129(H)  AST 15 - 41 U/L 13(L) 14(L) 13(L)  ALT 0 - 44 U/L _2 01/22/2019 [facility]: Tumor Mutational Burden     01/22/2019 PD-L1 Immunohistochemistry Analysis    01/22/2019 Soft Tissue Needle Core Biopsy Surgical Pathology     RADIOGRAPHIC STUDIES: I have personally reviewed the radiological images as listed and agreed with the findings in the report. No results found.  ASSESSMENT & PLAN:   This is a 54 year old malewith  1. Metastatic Poorly differentiated lung adenocarcinoma Presented with Large neck mass, mediastinal mass, renal mass, and questionable bone lesions no brain mets on MRI brain 01/22/2019 PD-L1 Immunohistochemistry Analysis which revealed "Tumor Proportion Score (TPS) 50%" 05/16/2019 PET/CT  ([id number]) revealed "Radiation changes in the right hemithorax, as above. Improving mediastinal nodal metastases. Prior bulky right cervical metastases have resolved. No evidence of metastatic disease in the abdomen/pelvis." 07/19/2019 Esophagus Scan ([id number]) revealed "Mild stricture at the C6-7 level likely related to prior esophageal surgery. Barium tablet was slow to pass through this area but did pass through after 1 minutes. Hiatal hernia with moderate gastroesophageal reflux. Moderate stricture above the hiatal hernia likely due to reflux. Barium tablet did not pass this area. There are changes of esophagitis which are likely due to reflux and possibly radiation as well."  10/10/19 of  CT Chest W Contrast ([id number])- no evidence of lung cancer progression at this time.   2. 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 Acute DVT- Right IJ, Right Innominate Vein, and likely also the SVC- related to malignancy+ tobacco-  on long term Eliquis  4. Symptomatic hemorrhoids  5. Normocytic anemia -Likely due to underlying malignancy -Will check ferritin, iron studies, vitamin B12 level, and RBC folate in the a.m.  6.Thrombocytosis -- likely due to paraneoplastic effect of tumor and reactive due to inflammation and tissue inflammation from RT.-- now resolved.  7. Nicotine dependence -He was counseled about tobacco cessation. -has quit since cancer diagnosis.  PLAN:  -Discussed pt labwork today, 12/19/19; blood counts and chemistries are steady, Magnesium is WNL, TSH is WNL -The pt has no prohibitive toxicities from continuing C14D1 of Atezolizumab at this time.  -Will continue Atezolizumab until progression or intolerance. Orders reviewed and signed.  -Recommend pt receive the COVID19 vaccine, especially if he is interested in returning to work - pt has decided not to receive the vaccine.  -  Recommend pt hold Eliquis for two days prior to his tooth extraction. Unless actively  bleeding, restart Eliquis 24-48 hours after the dental procedure.  -Will see back with every other treatment   FOLLOW UP: Please schedule next 4 cycles of Atezolizumab with portflush and labs MD visit every other treatment   The total time spent in the appt was 30 minutes and more than 50% was on counseling and direct patient cares, ordering and managing immunotherapy.  All of the patient's questions were answered with apparent satisfaction. The patient knows to call the clinic with any problems, questions or concerns.   David Lone MD [place] AAHIVMS [facility] Hematology/Oncology Physician [facility]  (Office):       [phone] (Work cell):  [phone] (Fax):           [phone]  12/19/2019 10:25 AM  I, David Berry, am acting as a scribe for Dr. Sullivan Berry.   .I have reviewed the above documentation for accuracy and completeness, and I agree with the above. David Genera MD

## 2019-12-19 NOTE — Patient Instructions (Signed)
[place] Discharge Instructions for Patients Receiving Chemotherapy  Today you received the following chemotherapy agents: atezolizumab.  To help prevent nausea and vomiting after your treatment, we encourage you to take your nausea medication as directed.   If you develop nausea and vomiting that is not controlled by your nausea medication, call the clinic.   BELOW ARE SYMPTOMS THAT SHOULD BE REPORTED IMMEDIATELY:  *FEVER GREATER THAN 100.5 F  *CHILLS WITH OR WITHOUT FEVER  NAUSEA AND VOMITING THAT IS NOT CONTROLLED WITH YOUR NAUSEA MEDICATION  *UNUSUAL SHORTNESS OF BREATH  *UNUSUAL BRUISING OR BLEEDING  TENDERNESS IN MOUTH AND THROAT WITH OR WITHOUT PRESENCE OF ULCERS  *URINARY PROBLEMS  *BOWEL PROBLEMS  UNUSUAL RASH Items with * indicate a potential emergency and should be followed up as soon as possible.  Feel free to call the clinic should you have any questions or concerns. The clinic phone number is [phone].  Please show the [facility] at check-in to the Emergency Department and triage nurse.

## 2019-12-20 ENCOUNTER — Telehealth: Payer: Self-pay | Admitting: Hematology

## 2019-12-20 NOTE — Telephone Encounter (Signed)
Scheduled per 08/11 los, patient has been called and notified.

## 2020-01-09 ENCOUNTER — Other Ambulatory Visit: Payer: Self-pay

## 2020-01-09 ENCOUNTER — Inpatient Hospital Stay: Payer: Medicaid Other

## 2020-01-09 ENCOUNTER — Inpatient Hospital Stay: Payer: Medicaid Other | Attending: Hematology

## 2020-01-09 VITALS — BP 106/71 | HR 79 | Temp 98.3°F | Resp 18 | Ht 71.0 in | Wt 136.0 lb

## 2020-01-09 DIAGNOSIS — C3491 Malignant neoplasm of unspecified part of right bronchus or lung: Secondary | ICD-10-CM

## 2020-01-09 DIAGNOSIS — C3411 Malignant neoplasm of upper lobe, right bronchus or lung: Secondary | ICD-10-CM | POA: Insufficient documentation

## 2020-01-09 DIAGNOSIS — Z7901 Long term (current) use of anticoagulants: Secondary | ICD-10-CM | POA: Insufficient documentation

## 2020-01-09 DIAGNOSIS — Z79899 Other long term (current) drug therapy: Secondary | ICD-10-CM | POA: Diagnosis not present

## 2020-01-09 DIAGNOSIS — Z5112 Encounter for antineoplastic immunotherapy: Secondary | ICD-10-CM | POA: Insufficient documentation

## 2020-01-09 DIAGNOSIS — Z87891 Personal history of nicotine dependence: Secondary | ICD-10-CM | POA: Diagnosis not present

## 2020-01-09 DIAGNOSIS — Z8042 Family history of malignant neoplasm of prostate: Secondary | ICD-10-CM | POA: Diagnosis not present

## 2020-01-09 DIAGNOSIS — Z5111 Encounter for antineoplastic chemotherapy: Secondary | ICD-10-CM

## 2020-01-09 DIAGNOSIS — Z86718 Personal history of other venous thrombosis and embolism: Secondary | ICD-10-CM | POA: Diagnosis not present

## 2020-01-09 DIAGNOSIS — Z95828 Presence of other vascular implants and grafts: Secondary | ICD-10-CM

## 2020-01-09 DIAGNOSIS — Z7189 Other specified counseling: Secondary | ICD-10-CM

## 2020-01-09 LAB — CBC WITH DIFFERENTIAL/PLATELET
Abs Immature Granulocytes: 0.01 10*3/uL (ref 0.00–0.07)
Basophils Absolute: 0 10*3/uL (ref 0.0–0.1)
Basophils Relative: 0 %
Eosinophils Absolute: 0 10*3/uL (ref 0.0–0.5)
Eosinophils Relative: 0 %
HCT: 36.2 % — ABNORMAL LOW (ref 39.0–52.0)
Hemoglobin: 11.5 g/dL — ABNORMAL LOW (ref 13.0–17.0)
Immature Granulocytes: 0 %
Lymphocytes Relative: 14 %
Lymphs Abs: 0.7 10*3/uL (ref 0.7–4.0)
MCH: 26.9 pg (ref 26.0–34.0)
MCHC: 31.8 g/dL (ref 30.0–36.0)
MCV: 84.6 fL (ref 80.0–100.0)
Monocytes Absolute: 0.5 10*3/uL (ref 0.1–1.0)
Monocytes Relative: 9 %
Neutro Abs: 3.9 10*3/uL (ref 1.7–7.7)
Neutrophils Relative %: 77 %
Platelets: 270 10*3/uL (ref 150–400)
RBC: 4.28 MIL/uL (ref 4.22–5.81)
RDW: 14.8 % (ref 11.5–15.5)
WBC: 5.1 10*3/uL (ref 4.0–10.5)
nRBC: 0 % (ref 0.0–0.2)

## 2020-01-09 LAB — CMP (CANCER CENTER ONLY)
ALT: 8 U/L (ref 0–44)
AST: 12 U/L — ABNORMAL LOW (ref 15–41)
Albumin: 3.8 g/dL (ref 3.5–5.0)
Alkaline Phosphatase: 119 U/L (ref 38–126)
Anion gap: 6 (ref 5–15)
BUN: 11 mg/dL (ref 6–20)
CO2: 27 mmol/L (ref 22–32)
Calcium: 10.2 mg/dL (ref 8.9–10.3)
Chloride: 105 mmol/L (ref 98–111)
Creatinine: 0.76 mg/dL (ref 0.61–1.24)
GFR, Est AFR Am: >60 mL/min (ref >60)
GFR, Estimated: >60 mL/min (ref >60)
Glucose, Bld: 98 mg/dL (ref 70–99)
Potassium: 4.1 mmol/L (ref 3.5–5.1)
Sodium: 138 mmol/L (ref 135–145)
Total Bilirubin: 0.3 mg/dL (ref 0.3–1.2)
Total Protein: 6.9 g/dL (ref 6.5–8.1)

## 2020-01-09 LAB — MAGNESIUM: Magnesium: 1.8 mg/dL (ref 1.7–2.4)

## 2020-01-09 MED ORDER — SODIUM CHLORIDE 0.9 % IV SOLN
1200.0000 mg | Freq: Once | INTRAVENOUS | Status: AC
  Administered 2020-01-09: 1200 mg via INTRAVENOUS
  Filled 2020-01-09: qty 20

## 2020-01-09 MED ORDER — FAMOTIDINE 20 MG PO TABS
20.0000 mg | ORAL_TABLET | Freq: Once | ORAL | Status: AC
  Administered 2020-01-09: 20 mg via ORAL

## 2020-01-09 MED ORDER — DIPHENHYDRAMINE HCL 25 MG PO TABS
25.0000 mg | ORAL_TABLET | Freq: Once | ORAL | Status: AC
  Administered 2020-01-09: 25 mg via ORAL
  Filled 2020-01-09: qty 1

## 2020-01-09 MED ORDER — SODIUM CHLORIDE 0.9% FLUSH
10.0000 mL | INTRAVENOUS | Status: DC | PRN
  Administered 2020-01-09: 10 mL
  Filled 2020-01-09: qty 10

## 2020-01-09 MED ORDER — FAMOTIDINE 20 MG PO TABS
ORAL_TABLET | ORAL | Status: AC
  Filled 2020-01-09: qty 1

## 2020-01-09 MED ORDER — ACETAMINOPHEN 325 MG PO TABS
650.0000 mg | ORAL_TABLET | Freq: Once | ORAL | Status: AC
  Administered 2020-01-09: 650 mg via ORAL

## 2020-01-09 MED ORDER — DIPHENHYDRAMINE HCL 25 MG PO CAPS
ORAL_CAPSULE | ORAL | Status: AC
  Filled 2020-01-09: qty 1

## 2020-01-09 MED ORDER — ACETAMINOPHEN 325 MG PO TABS
ORAL_TABLET | ORAL | Status: AC
  Filled 2020-01-09: qty 2

## 2020-01-09 MED ORDER — SODIUM CHLORIDE 0.9 % IV SOLN
Freq: Once | INTRAVENOUS | Status: AC
  Filled 2020-01-09: qty 250

## 2020-01-09 MED ORDER — HEPARIN SOD (PORK) LOCK FLUSH 100 UNIT/ML IV SOLN
500.0000 [IU] | Freq: Once | INTRAVENOUS | Status: AC | PRN
  Administered 2020-01-09: 500 [IU]
  Filled 2020-01-09: qty 5

## 2020-01-09 NOTE — Patient Instructions (Signed)
[place] Discharge Instructions for Patients Receiving Chemotherapy  Today you received the following chemotherapy agents Atezolizumab [facility]).  To help prevent nausea and vomiting after your treatment, we encourage you to take your nausea medication as prescribed.   If you develop nausea and vomiting that is not controlled by your nausea medication, call the clinic.   BELOW ARE SYMPTOMS THAT SHOULD BE REPORTED IMMEDIATELY:  *FEVER GREATER THAN 100.5 F  *CHILLS WITH OR WITHOUT FEVER  NAUSEA AND VOMITING THAT IS NOT CONTROLLED WITH YOUR NAUSEA MEDICATION  *UNUSUAL SHORTNESS OF BREATH  *UNUSUAL BRUISING OR BLEEDING  TENDERNESS IN MOUTH AND THROAT WITH OR WITHOUT PRESENCE OF ULCERS  *URINARY PROBLEMS  *BOWEL PROBLEMS  UNUSUAL RASH Items with * indicate a potential emergency and should be followed up as soon as possible.  Feel free to call the clinic should you have any questions or concerns. The clinic phone number is [phone].  Please show the [facility] at check-in to the Emergency Department and triage nurse.

## 2020-01-29 NOTE — Progress Notes (Signed)
HEMATOLOGY/ONCOLOGY CLINIC NOTE  Date of Service: 01/30/2020  Patient Care Team: Deeann Saint, MD as PCP - General (Family Medicine)  CHIEF COMPLAINTS/PURPOSE OF CONSULTATION:  contnued management of Metastatic Poorly differentiated lung adenocarcinoma  HISTORY OF PRESENTING ILLNESS:   David Berry is a 54 year old male with no significant medical history.  He presented to the hospital with progressive right arm and chest pain with new right-sided neck swelling.  The patient states that he was diagnosed with carpal tunnel syndrome received an injection in August.  However, he continued to have progressive pain up and down his right arm that would also radiate to his right chest.  He took ibuprofen with no improvement.  1 week prior to admission, he developed new right sided neck swelling.  He presented to the emergency room for further evaluation.  Work-up in the emergency room was significant for a CT angiogram of the neck and chest which revealed a bulky soft tissue mass in the right neck continuing into the upper chest most compatible with extensive metastatic lymphadenopathy.  This mass measures up to 34 mm and encases multiple vessels including the right upper lobe pulmonary vessels, superior vena cava, and brachiocephalic vessel.  He also had a large poorly defined malignant appearing infiltrative mass measuring 6.2 x 5.6 x 7.9 cm within the right anterior and middle mediastinum with suspected invasion of the distal left brachiocephalic vein and probable tumor occlusion of the right subclavian and jugular veins.  There is also suspected tumor invasion of the upper SVC with string-like narrowing of the SVC but with patency of the SVC just above the right atrium.  The tumor abuts the aorta and great vessels and also results in significant narrowing of the right upper lobe pulmonary arterial vessels.  He also had a CT of the abdomen pelvis which showed a 15 mm heterogeneously enhancing lesion  in the upper pole of the right kidney concerning for renal cell carcinoma, no lymphadenopathy in the abdomen or pelvis, focal hyperenhancement of the anterior left liver most likely related to aberrant venous anatomy/flow, sclerotic lesions in the right acetabulum and left iliac bone are indeterminate.  These are likely bone islands but attention on follow-up is recommended a metastatic disease cannot be excluded.  MRI of the brain with and without contrast did not show any evidence of intracranial metastatic disease. The patient underwent a CT-guided biopsy in interventional radiology on 01/22/2019.  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   When seen today, patient reports ongoing pain and swelling to his right arm and right side of his neck.  Reports ongoing pain to his right chest.  He reports intermittent facial swelling which is worse in the morning and improves throughout the day.  He reports anorexia and a weight loss of 20 pounds over the past few weeks. He has had dizziness for the past few months.  Denies headaches.  He also noted that on the day of admission his peripheral vision was poor.  His vision is now improved.  He denies food getting stuck or difficulty swallowing but does feel as though his throat is "tight."  He reports a nonproductive cough.  Denies fevers and chills.  He is not really having any shortness of breath.  Denies abdominal pain, nausea, vomiting, constipation, diarrhea.  He has not noticed any epistaxis, hemoptysis, hematemesis, hematuria, melena, hematochezia.  The patient is single.  He has 1 daughter who lives  in [place], [place].  Reports occasional alcohol use and currently smokes 3 cigars a day since 1997.  He is currently living in a rooming house with other roommates who all have their own room.  They share a common bathroom and kitchen.  Medical oncology was asked see the  patient to make recommendations regarding his newly diagnosed poorly differentiated carcinoma.  History reviewed. No pertinent past medical history.   Current Treatment:  Atezolizumab maintenance   INTERVAL HISTORY:   David Snelgrove. is a 54 y.o. male who is here for evaluation and management of metastatic poorly differentiated lung adenocarcinoma. He is here for C16 of Atezolizumab. The patient's last visit with [place] was on 12/19/2019. The pt reports that he is doing well overall.  The pt reports that he has been experiencing intermittent, throbbing headaches. These headaches are bilateral, just above his ears. Pt is unaware of any triggers. Pt is still having some swelling and numbness in his upper extremities, but feels this is related to his Carpal tunnel.   Pt has been off of Eliquis since Tuesday and has a tooth extraction scheduled for tomorrow. He would like several other teeth removed. Pt is eating well outside of his current dental issues.   Pt has returned to work at [employer] and declines the [place] and annual flu vaccines.   Lab results today (01/30/20) of CBC w/diff and CMP is as follows: all values are WNL except for WBC at 3.5K, Hgb at 11.3, HCT at 35.9, Anion gap at 3. 01/30/2020 Magnesium at 1.8  On review of systems, pt reports hand swelling, numbness/tingling in hands/fingers, intermittent headaches, eating well and denies chest pain, SOB, abdominal pain, leg swelling, pain at port site and any other symptoms.   MEDICAL HISTORY:  Past Medical History:  Diagnosis Date  . Blood in stool   . Cancer ([place])   . Chronic bronchitis with emphysema ([place])   . GERD (gastroesophageal reflux disease)   . Lung cancer [facility])     SURGICAL HISTORY: Past Surgical History:  Procedure Laterality Date  . APPENDECTOMY     teenager  . INGUINAL HERNIA REPAIR Right 2016, 2017   [facility], 2018 [facility] mesh  . IR IMAGING GUIDED PORT  INSERTION  04/26/2019    SOCIAL HISTORY: Social History   Socioeconomic History  . Marital status: Single    Spouse name: Not on file  . Number of children: 1  . Years of education: GED  . Highest education level: Not on file  Occupational History    Comment: fork lift driver  Tobacco Use  . Smoking status: Former Smoker    Types: Cigars  . Smokeless tobacco: Never Used  . Tobacco comment: smokes 3 black and mild cigars per day  Substance and Sexual Activity  . Alcohol use: Not Currently  . Drug use: No  . Sexual activity: Yes  Other Topics Concern  . Not on file  Social History Narrative   Lives alone   Caffeine- coffee, 20 oz daily, tea occas   Social Determinants of Health   Financial Resource Strain:   . Difficulty of Paying Living Expenses: Not on file  Food Insecurity:   . Worried About Charity fundraiser in the Last Year: Not on file  . Ran Out of Food in the Last Year: Not on file  Transportation Needs:   . Lack of Transportation (Medical): Not on file  . Lack of Transportation (Non-Medical): Not  on file  Physical Activity:   . Days of Exercise per Week: Not on file  . Minutes of Exercise per Session: Not on file  Stress:   . Feeling of Stress : Not on file  Social Connections:   . Frequency of Communication with Friends and Family: Not on file  . Frequency of Social Gatherings with Friends and Family: Not on file  . Attends Religious Services: Not on file  . Active Member of Clubs or Organizations: Not on file  . Attends Banker Meetings: Not on file  . Marital Status: Not on file  Intimate Partner Violence:   . Fear of Current or Ex-Partner: Not on file  . Emotionally Abused: Not on file  . Physically Abused: Not on file  . Sexually Abused: Not on file    FAMILY HISTORY: Family History  Problem Relation Age of Onset  . Prostate cancer Father     ALLERGIES:  is allergic to pregabalin.  MEDICATIONS:  Current Outpatient  Medications  Medication Sig Dispense Refill  . amoxicillin (AMOXIL) 500 MG tablet Take 500 mg by mouth 4 (four) times daily.    [name] amoxicillin-clavulanate (AUGMENTIN) 875-125 MG tablet Take 1 tablet by mouth 2 (two) times daily. (Patient not taking: Reported on 10/18/2019) 20 tablet 0  . ELIQUIS 5 MG TABS tablet TAKE 1 TABLET BY MOUTH TWICE DAILY 60 tablet 11  . esomeprazole (NEXIUM) 40 MG capsule Take 1 capsule (40 mg total) by mouth daily. 30 capsule 1  . ibuprofen (ADVIL) 800 MG tablet Take 800 mg by mouth 3 (three) times daily.    [name] LORazepam (ATIVAN) 0.5 MG tablet Take 1 tablet (0.5 mg total) by mouth every 4 (four) hours as needed for anxiety. 60 tablet 0  . oxyCODONE (OXY IR/ROXICODONE) 5 MG immediate release tablet Take 1-2 tablets (5-10 mg total) by mouth every 4 (four) hours as needed for severe pain. 60 tablet 0   No current facility-administered medications for this visit.   Facility-Administered Medications Ordered in Other Visits  Medication Dose Route Frequency Provider Last Rate Last Admin  . atezolizumab (TECENTRIQ) 1,200 mg in sodium chloride 0.9 % 250 mL chemo infusion  1,200 mg Intravenous Once [name], MD      . heparin lock flush 100 unit/mL  500 Units Intracatheter Once PRN [name], MD      . sodium chloride flush (NS) 0.9 % injection 10 mL  10 mL Intracatheter PRN [name], MD        REVIEW OF SYSTEMS:   A 10+ POINT REVIEW OF SYSTEMS WAS OBTAINED including neurology, dermatology, psychiatry, cardiac, respiratory, lymph, extremities, GI, GU, Musculoskeletal, constitutional, breasts, reproductive, HEENT.  All pertinent positives are noted in the HPI.  All others are negative.   PHYSICAL EXAMINATION: ECOG FS:1 - Symptomatic but completely ambulatory  Vitals:   01/30/20 0923  BP: 100/79  Pulse: 70  Resp: 18  Temp: (!) 95.6 F (35.3 C)  SpO2: 100%   Wt Readings from Last 3 Encounters:  01/30/20 136 lb 8 oz (61.9 kg)  01/09/20  136 lb (61.7 kg)  12/19/19 136 lb 11.2 oz (62 kg)   Body mass index is 19.04 kg/m.    GENERAL:alert, in no acute distress and comfortable SKIN: no acute rashes, no significant lesions EYES: conjunctiva are pink and non-injected, sclera anicteric OROPHARYNX: MMM, no exudates, no oropharyngeal erythema or ulceration NECK: supple, no JVD LYMPH:  no palpable lymphadenopathy in the cervical, axillary or  inguinal regions LUNGS: clear to auscultation b/l with normal respiratory effort HEART: regular rate & rhythm ABDOMEN:  normoactive bowel sounds , non tender, not distended. No palpable hepatosplenomegaly.  Extremity: no pedal edema PSYCH: alert & oriented x 3 with fluent speech NEURO: no focal motor/sensory deficits  LABORATORY DATA:  I have reviewed the data as listed  . CBC Latest Ref Rng & Units 01/30/2020 01/09/2020 12/19/2019  WBC 4.0 - 10.5 K/uL 3.5(L) 5.1 3.9(L)  Hemoglobin 13.0 - 17.0 g/dL 11.3(L) 11.5(L) 11.8(L)  Hematocrit 39 - 52 % 35.9(L) 36.2(L) 37.4(L)  Platelets 150 - 400 K/uL 255 270 262    . CMP Latest Ref Rng & Units 01/30/2020 01/09/2020 12/19/2019  Glucose 70 - 99 mg/dL 95 98 95  BUN 6 - 20 mg/dL _0 Creatinine 0.61 - 1.24 mg/dL 0.80 0.76 0.82  Sodium 135 - 145 mmol/L 138 138 140  Potassium 3.5 - 5.1 mmol/L 4.0 4.1 4.2  Chloride 98 - 111 mmol/L 106 105 106  CO2 22 - 32 mmol/L _1 Calcium 8.9 - 10.3 mg/dL 9.4 10.2 10.1  Total Protein 6.5 - 8.1 g/dL 6.8 6.9 7.2  Total Bilirubin 0.3 - 1.2 mg/dL 0.3 0.3 0.3  Alkaline Phos 38 - 126 U/L 120 119 137(H)  AST 15 - 41 U/L 17 12(L) 13(L)  ALT 0 - 44 U/L _2 01/22/2019 [facility]: Tumor Mutational Burden     01/22/2019 PD-L1 Immunohistochemistry Analysis    01/22/2019 Soft Tissue Needle Core Biopsy Surgical Pathology     RADIOGRAPHIC STUDIES: I have personally reviewed the radiological images as listed and agreed with the findings in the report. No results found.  ASSESSMENT & PLAN:    This is a 54 year old malewith  1. Metastatic Poorly differentiated lung adenocarcinoma Presented with Large neck mass, mediastinal mass, renal mass, and questionable bone lesions no brain mets on MRI brain 01/22/2019 PD-L1 Immunohistochemistry Analysis which revealed "Tumor Proportion Score (TPS) 50%" 05/16/2019 PET/CT ([id number]) revealed "Radiation changes in the right hemithorax, as above. Improving mediastinal nodal metastases. Prior bulky right cervical metastases have resolved. No evidence of metastatic disease in the abdomen/pelvis." 07/19/2019 Esophagus Scan ([id number]) revealed "Mild stricture at the C6-7 level likely related to prior esophageal surgery. Barium tablet was slow to pass through this area but did pass through after 1 minutes. Hiatal hernia with moderate gastroesophageal reflux. Moderate stricture above the hiatal hernia likely due to reflux. Barium tablet did not pass this area. There are changes of esophagitis which are likely due to reflux and possibly radiation as well."  10/10/19 of  CT Chest W Contrast ([id number])- no evidence of lung cancer progression at this time.   2. 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 Acute DVT- Right IJ, Right Innominate Vein, and likely also the SVC- related to malignancy+ tobacco-  on long term Eliquis  4. Symptomatic hemorrhoids  5. Normocytic anemia -Likely due to underlying malignancy -Will check ferritin, iron studies, vitamin B12 level, and RBC folate in the a.m.  6.Thrombocytosis -- likely due to paraneoplastic effect of tumor and reactive due to inflammation and tissue inflammation from RT.-- now resolved.  7. Nicotine dependence -He was counseled about tobacco cessation. -has quit since cancer diagnosis.  PLAN:  -Discussed pt labwork today, 12/19/19; blood counts and chemistries are steady, Magnesium is WNL, TSH is WNL -The pt has no prohibitive toxicities from continuing C14D1 of Atezolizumab at  this time.  -Will continue Atezolizumab until  progression or intolerance. Orders reviewed and signed.  -Recommend pt receive the COVID19 vaccine, especially if he is interested in returning to work - pt has decided not to receive the vaccine.  -Recommend pt hold Eliquis for two days prior to his tooth extraction. Unless actively bleeding, restart Eliquis 24-48 hours after the dental procedure.  -Will see back with every other treatment   FOLLOW UP: Please schedule next 4 cycles of Atezolizumab with portflush and labs MD visit every other treatment   The total time spent in the appt was 30 minutes and more than 50% was on counseling and direct patient cares, ordering and managing immunotherapy.  All of the patient's questions were answered with apparent satisfaction. The patient knows to call the clinic with any problems, questions or concerns.   David Lone MD [place] AAHIVMS [facility] Hematology/Oncology Physician [facility]  (Office):       [phone] (Work cell):  [phone] (Fax):           [phone]  01/30/2020 10:44 AM  I, Yevette Edwards, am acting as a scribe for Dr. Sullivan Berry.   .I have reviewed the above documentation for accuracy and completeness, and I agree with the above. Brunetta Genera MD                          HEMATOLOGY/ONCOLOGY CLINIC NOTE  Date of Service: 01/30/2020  Patient Care Team: [name], MD as PCP - General (Family Medicine)  CHIEF COMPLAINTS/PURPOSE OF CONSULTATION:  contnued management of Metastatic Poorly differentiated lung adenocarcinoma  HISTORY OF PRESENTING ILLNESS:   Mr. Burgoon is a 54 year old male with no significant medical history.  He presented to the hospital with progressive right arm and chest pain with new right-sided neck swelling.  The patient states that he was diagnosed with carpal tunnel syndrome received an injection in August.  However, he continued to have progressive  pain up and down his right arm that would also radiate to his right chest.  He took ibuprofen with no improvement.  1 week prior to admission, he developed new right sided neck swelling.  He presented to the emergency room for further evaluation.  Work-up in the emergency room was significant for a CT angiogram of the neck and chest which revealed a bulky soft tissue mass in the right neck continuing into the upper chest most compatible with extensive metastatic lymphadenopathy.  This mass measures up to 34 mm and encases multiple vessels including the right upper lobe pulmonary vessels, superior vena cava, and brachiocephalic vessel.  He also had a large poorly defined malignant appearing infiltrative mass measuring 6.2 x 5.6 x 7.9 cm within the right anterior and middle mediastinum with suspected invasion of the distal left brachiocephalic vein and probable tumor occlusion of the right subclavian and jugular veins.  There is also suspected tumor invasion of the upper SVC with string-like narrowing of the SVC but with patency of the SVC just above the right atrium.  The tumor abuts the aorta and great vessels and also results in significant narrowing of the right upper lobe pulmonary arterial vessels.  He also had a CT of the abdomen pelvis which showed a 15 mm heterogeneously enhancing lesion in the upper pole of the right kidney concerning for renal cell carcinoma, no lymphadenopathy in the abdomen or pelvis, focal hyperenhancement of the anterior left liver most likely related to aberrant venous anatomy/flow, sclerotic lesions in the right acetabulum  and left iliac bone are indeterminate.  These are likely bone islands but attention on follow-up is recommended a metastatic disease cannot be excluded.  MRI of the brain with and without contrast did not show any evidence of intracranial metastatic disease. The patient underwent a CT-guided biopsy in interventional radiology on 01/22/2019.  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   When seen today, patient reports ongoing pain and swelling to his right arm and right side of his neck.  Reports ongoing pain to his right chest.  He reports intermittent facial swelling which is worse in the morning and improves throughout the day.  He reports anorexia and a weight loss of 20 pounds over the past few weeks. He has had dizziness for the past few months.  Denies headaches.  He also noted that on the day of admission his peripheral vision was poor.  His vision is now improved.  He denies food getting stuck or difficulty swallowing but does feel as though his throat is "tight."  He reports a nonproductive cough.  Denies fevers and chills.  He is not really having any shortness of breath.  Denies abdominal pain, nausea, vomiting, constipation, diarrhea.  He has not noticed any epistaxis, hemoptysis, hematemesis, hematuria, melena, hematochezia.  The patient is single.  He has 1 daughter who lives in [place], [place].  Reports occasional alcohol use and currently smokes 3 cigars a day since 1997.  He is currently living in a rooming house with other roommates who all have their own room.  They share a common bathroom and kitchen.  Medical oncology was asked see the patient to make recommendations regarding his newly diagnosed poorly differentiated carcinoma.  History reviewed. No pertinent past medical history.   Current Treatment:  Atezolizumab maintenance   INTERVAL HISTORY:   David Dershem. is a 54 y.o. male who is here for evaluation and management of metastatic poorly differentiated lung adenocarcinoma. He is here for C14 of Atezolizumab. The patient's last visit with [place] was on 11/07/2019. The pt reports that he is doing well overall.  The pt reports that he has new boils along his left flank, between his legs, and along his upper lip. He is working with a Pharmacist, community and  is planning on having multiple teeth extracted. Pt is interested in going back to work.   Lab results today (12/19/19) of CBC w/diff and CMP is as follows: all values are WNL except for WBC at 3.9K, Hgb at 11.8, HCT at 37.4, AST at 13, ALP at 137. 12/19/2019 TSH at 0.714 12/19/2019 Magnesium at 1.8  On review of systems, pt reports boils and denies any other symptoms.   MEDICAL HISTORY:  Past Medical History:  Diagnosis Date  . Blood in stool   . Cancer ([place])   . Chronic bronchitis with emphysema ([place])   . GERD (gastroesophageal reflux disease)   . Lung cancer [facility])     SURGICAL HISTORY: Past Surgical History:  Procedure Laterality Date  . APPENDECTOMY     teenager  . INGUINAL HERNIA REPAIR Right 2016, 2017   [facility], 2018 [facility] Xxx mesh  . IR IMAGING GUIDED PORT INSERTION  04/26/2019    SOCIAL HISTORY: Social History   Socioeconomic History  . Marital status: Single    Spouse name: Not on file  . Number of children: 1  . Years of education: GED  . Highest education level: Not on file  Occupational History    Comment: fork lift driver  Tobacco Use  . Smoking status: Former Smoker    Types: Cigars  . Smokeless tobacco: Never Used  . Tobacco comment: smokes 3 black and mild cigars per day  Substance and Sexual Activity  . Alcohol use: Not Currently  . Drug use: No  . Sexual activity: Yes  Other Topics Concern  . Not on file  Social History Narrative   Lives alone   Caffeine- coffee, 20 oz daily, tea occas   Social Determinants of Health   Financial Resource Strain:   . Difficulty of Paying Living Expenses: Not on file  Food Insecurity:   . Worried About Charity fundraiser in the Last Year: Not on file  . Ran Out of Food in the Last Year: Not on file  Transportation Needs:   . Lack of Transportation (Medical): Not on file  . Lack of Transportation (Non-Medical): Not on file  Physical Activity:   . Days of Exercise per Week: Not on file    . Minutes of Exercise per Session: Not on file  Stress:   . Feeling of Stress : Not on file  Social Connections:   . Frequency of Communication with Friends and Family: Not on file  . Frequency of Social Gatherings with Friends and Family: Not on file  . Attends Religious Services: Not on file  . Active Member of Clubs or Organizations: Not on file  . Attends Archivist Meetings: Not on file  . Marital Status: Not on file  Intimate Partner Violence:   . Fear of Current or Ex-Partner: Not on file  . Emotionally Abused: Not on file  . Physically Abused: Not on file  . Sexually Abused: Not on file    FAMILY HISTORY: Family History  Problem Relation Age of Onset  . Prostate cancer Father     ALLERGIES:  is allergic to pregabalin.  MEDICATIONS:  Current Outpatient Medications  Medication Sig Dispense Refill  . amoxicillin (AMOXIL) 500 MG tablet Take 500 mg by mouth 4 (four) times daily.    [name] amoxicillin-clavulanate (AUGMENTIN) 875-125 MG tablet Take 1 tablet by mouth 2 (two) times daily. (Patient not taking: Reported on 10/18/2019) 20 tablet 0  . ELIQUIS 5 MG TABS tablet TAKE 1 TABLET BY MOUTH TWICE DAILY 60 tablet 11  . esomeprazole (NEXIUM) 40 MG capsule Take 1 capsule (40 mg total) by mouth daily. 30 capsule 1  . ibuprofen (ADVIL) 800 MG tablet Take 800 mg by mouth 3 (three) times daily.    [name] LORazepam (ATIVAN) 0.5 MG tablet Take 1 tablet (0.5 mg total) by mouth every 4 (four) hours as needed for anxiety. 60 tablet 0  . oxyCODONE (OXY IR/ROXICODONE) 5 MG immediate release tablet Take 1-2 tablets (5-10 mg total) by mouth every 4 (four) hours as needed for severe pain. 60 tablet 0   No current facility-administered medications for this visit.   Facility-Administered Medications Ordered in Other Visits  Medication Dose Route Frequency Provider Last Rate Last Admin  . atezolizumab (TECENTRIQ) 1,200 mg in sodium chloride 0.9 % 250 mL chemo infusion  1,200 mg Intravenous  Once Brunetta Genera, MD      . heparin lock flush 100 unit/mL  500 Units Intracatheter Once PRN Brunetta Genera, MD      . sodium chloride flush (NS) 0.9 % injection 10 mL  10 mL Intracatheter PRN Brunetta Genera, MD        REVIEW  OF SYSTEMS:   A 10+ POINT REVIEW OF SYSTEMS WAS OBTAINED including neurology, dermatology, psychiatry, cardiac, respiratory, lymph, extremities, GI, GU, Musculoskeletal, constitutional, breasts, reproductive, HEENT.  All pertinent positives are noted in the HPI.  All others are negative.   PHYSICAL EXAMINATION: ECOG FS:1 - Symptomatic but completely ambulatory  Vitals:   01/30/20 0923  BP: 100/79  Pulse: 70  Resp: 18  Temp: (!) 95.6 F (35.3 C)  SpO2: 100%   Wt Readings from Last 3 Encounters:  01/30/20 136 lb 8 oz (61.9 kg)  01/09/20 136 lb (61.7 kg)  12/19/19 136 lb 11.2 oz (62 kg)   Body mass index is 19.04 kg/m.    GENERAL:alert, in no acute distress and comfortable SKIN: no acute rashes, no significant lesions EYES: conjunctiva are pink and non-injected, sclera anicteric OROPHARYNX: MMM, no exudates, no oropharyngeal erythema or ulceration NECK: supple, no JVD LYMPH:  no palpable lymphadenopathy in the cervical, axillary or inguinal regions LUNGS: clear to auscultation b/l with normal respiratory effort HEART: regular rate & rhythm ABDOMEN:  normoactive bowel sounds , non tender, not distended. No palpable hepatosplenomegaly.  Extremity: no pedal edema PSYCH: alert & oriented x 3 with fluent speech NEURO: no focal motor/sensory deficits  LABORATORY DATA:  I have reviewed the data as listed  . CBC Latest Ref Rng & Units 01/30/2020 01/09/2020 12/19/2019  WBC 4.0 - 10.5 K/uL 3.5(L) 5.1 3.9(L)  Hemoglobin 13.0 - 17.0 g/dL 11.3(L) 11.5(L) 11.8(L)  Hematocrit 39 - 52 % 35.9(L) 36.2(L) 37.4(L)  Platelets 150 - 400 K/uL 255 270 262    . CMP Latest Ref Rng & Units 01/30/2020 01/09/2020 12/19/2019  Glucose 70 - 99 mg/dL 95 98 95    BUN 6 - 20 mg/dL _0 Creatinine 0.61 - 1.24 mg/dL 0.80 0.76 0.82  Sodium 135 - 145 mmol/L 138 138 140  Potassium 3.5 - 5.1 mmol/L 4.0 4.1 4.2  Chloride 98 - 111 mmol/L 106 105 106  CO2 22 - 32 mmol/L _1 Calcium 8.9 - 10.3 mg/dL 9.4 10.2 10.1  Total Protein 6.5 - 8.1 g/dL 6.8 6.9 7.2  Total Bilirubin 0.3 - 1.2 mg/dL 0.3 0.3 0.3  Alkaline Phos 38 - 126 U/L 120 119 137(H)  AST 15 - 41 U/L 17 12(L) 13(L)  ALT 0 - 44 U/L _2 01/22/2019 [facility]: Tumor Mutational Burden     01/22/2019 PD-L1 Immunohistochemistry Analysis    01/22/2019 Soft Tissue Needle Core Biopsy Surgical Pathology     RADIOGRAPHIC STUDIES: I have personally reviewed the radiological images as listed and agreed with the findings in the report. No results found.  ASSESSMENT & PLAN:   This is a 54 year old malewith  1. Metastatic Poorly differentiated lung adenocarcinoma Presented with Large neck mass, mediastinal mass, renal mass, and questionable bone lesions no brain mets on MRI brain 01/22/2019 PD-L1 Immunohistochemistry Analysis which revealed "Tumor Proportion Score (TPS) 50%" 05/16/2019 PET/CT ([id number]) revealed "Radiation changes in the right hemithorax, as above. Improving mediastinal nodal metastases. Prior bulky right cervical metastases have resolved. No evidence of metastatic disease in the abdomen/pelvis." 07/19/2019 Esophagus Scan ([id number]) revealed "Mild stricture at the C6-7 level likely related to prior esophageal surgery. Barium tablet was slow to pass through this area but did pass through after 1 minutes. Hiatal hernia with moderate gastroesophageal reflux. Moderate stricture above the hiatal hernia likely due to reflux. Barium tablet did not pass this area. There are changes of esophagitis which are  likely due to reflux and possibly radiation as well."  10/10/19 of  CT Chest W Contrast ([id number])- no evidence of lung cancer progression at this time.   2.  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 Acute DVT- Right IJ, Right Innominate Vein, and likely also the SVC- related to malignancy+ tobacco-  on long term Eliquis  4. Symptomatic hemorrhoids  5. Normocytic anemia -Likely due to underlying malignancy -Will check ferritin, iron studies, vitamin B12 level, and RBC folate in the a.m.  6.Thrombocytosis -- likely due to paraneoplastic effect of tumor and reactive due to inflammation and tissue inflammation from RT.-- now resolved.  7. Nicotine dependence -He was counseled about tobacco cessation. -has quit since cancer diagnosis.  PLAN:  -Discussed pt labwork today, 01/30/20; blood counts and chemistries are steady, Magnesium is WNL -The pt has no prohibitive toxicities from continuing C16D1 of Atezolizumab at this time. Will continue until progression or intolerance.  -Advised pt that there are no contraindications to tooth extraction from a treatment/Adenocarcinoma standpoint.  -Recommend prophylactic antibiotics with any significant dental procedures.  -Recommend pt hold Eliquis two days prior and restart after hemostasis is achieved after procedure. 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  -Will repeat CT Neck/Chest/Abd/Pel in 4-5 weeks  -Will see back with every other treatment   FOLLOW UP: Please schedule next 4 cycles of Atezolizumab with portflush and labs CT neck/chest/abd/pelvis MD visit every other treatment (next visit in 6 weeks)   The total time spent in the appt was 20 minutes and more than 50% was on counseling and direct patient cares.  All of the patient's questions were answered with apparent satisfaction. The patient knows to call the clinic with any problems, questions or concerns.   David Lone MD [place] AAHIVMS [facility] Hematology/Oncology Physician [facility]  (Office):        [phone] (Work cell):  [phone] (Fax):           [phone]  01/30/2020 10:44 AM  I, Yevette Edwards, am acting as a scribe for Dr. Sullivan Berry.   .I have reviewed the above documentation for accuracy and completeness, and I agree with the above. Brunetta Genera MD

## 2020-01-30 ENCOUNTER — Inpatient Hospital Stay (HOSPITAL_BASED_OUTPATIENT_CLINIC_OR_DEPARTMENT_OTHER): Payer: Medicaid Other | Admitting: Hematology

## 2020-01-30 ENCOUNTER — Inpatient Hospital Stay: Payer: Medicaid Other

## 2020-01-30 ENCOUNTER — Other Ambulatory Visit: Payer: Self-pay

## 2020-01-30 VITALS — BP 100/79 | HR 70 | Temp 95.6°F | Resp 18 | Ht 71.0 in | Wt 136.5 lb

## 2020-01-30 DIAGNOSIS — C3491 Malignant neoplasm of unspecified part of right bronchus or lung: Secondary | ICD-10-CM

## 2020-01-30 DIAGNOSIS — Z5111 Encounter for antineoplastic chemotherapy: Secondary | ICD-10-CM | POA: Diagnosis not present

## 2020-01-30 DIAGNOSIS — Z95828 Presence of other vascular implants and grafts: Secondary | ICD-10-CM

## 2020-01-30 DIAGNOSIS — I82621 Acute embolism and thrombosis of deep veins of right upper extremity: Secondary | ICD-10-CM

## 2020-01-30 DIAGNOSIS — Z5112 Encounter for antineoplastic immunotherapy: Secondary | ICD-10-CM | POA: Diagnosis not present

## 2020-01-30 DIAGNOSIS — Z7189 Other specified counseling: Secondary | ICD-10-CM

## 2020-01-30 LAB — CMP (CANCER CENTER ONLY)
ALT: 12 U/L (ref 0–44)
AST: 17 U/L (ref 15–41)
Albumin: 3.7 g/dL (ref 3.5–5.0)
Alkaline Phosphatase: 120 U/L (ref 38–126)
Anion gap: 3 — ABNORMAL LOW (ref 5–15)
BUN: 9 mg/dL (ref 6–20)
CO2: 29 mmol/L (ref 22–32)
Calcium: 9.4 mg/dL (ref 8.9–10.3)
Chloride: 106 mmol/L (ref 98–111)
Creatinine: 0.8 mg/dL (ref 0.61–1.24)
GFR, Est AFR Am: >60 mL/min (ref >60)
GFR, Estimated: >60 mL/min (ref >60)
Glucose, Bld: 95 mg/dL (ref 70–99)
Potassium: 4 mmol/L (ref 3.5–5.1)
Sodium: 138 mmol/L (ref 135–145)
Total Bilirubin: 0.3 mg/dL (ref 0.3–1.2)
Total Protein: 6.8 g/dL (ref 6.5–8.1)

## 2020-01-30 LAB — CBC WITH DIFFERENTIAL/PLATELET
Abs Immature Granulocytes: 0 10*3/uL (ref 0.00–0.07)
Basophils Absolute: 0 10*3/uL (ref 0.0–0.1)
Basophils Relative: 1 %
Eosinophils Absolute: 0.1 10*3/uL (ref 0.0–0.5)
Eosinophils Relative: 1 %
HCT: 35.9 % — ABNORMAL LOW (ref 39.0–52.0)
Hemoglobin: 11.3 g/dL — ABNORMAL LOW (ref 13.0–17.0)
Immature Granulocytes: 0 %
Lymphocytes Relative: 24 %
Lymphs Abs: 0.8 10*3/uL (ref 0.7–4.0)
MCH: 26.4 pg (ref 26.0–34.0)
MCHC: 31.5 g/dL (ref 30.0–36.0)
MCV: 83.9 fL (ref 80.0–100.0)
Monocytes Absolute: 0.5 10*3/uL (ref 0.1–1.0)
Monocytes Relative: 15 %
Neutro Abs: 2.1 10*3/uL (ref 1.7–7.7)
Neutrophils Relative %: 59 %
Platelets: 255 10*3/uL (ref 150–400)
RBC: 4.28 MIL/uL (ref 4.22–5.81)
RDW: 15.1 % (ref 11.5–15.5)
WBC: 3.5 10*3/uL — ABNORMAL LOW (ref 4.0–10.5)
nRBC: 0 % (ref 0.0–0.2)

## 2020-01-30 LAB — MAGNESIUM: Magnesium: 1.8 mg/dL (ref 1.7–2.4)

## 2020-01-30 MED ORDER — ACETAMINOPHEN 325 MG PO TABS
ORAL_TABLET | ORAL | Status: AC
  Filled 2020-01-30: qty 2

## 2020-01-30 MED ORDER — ACETAMINOPHEN 325 MG PO TABS
650.0000 mg | ORAL_TABLET | Freq: Once | ORAL | Status: AC
  Administered 2020-01-30: 650 mg via ORAL

## 2020-01-30 MED ORDER — SODIUM CHLORIDE 0.9% FLUSH
10.0000 mL | INTRAVENOUS | Status: DC | PRN
  Administered 2020-01-30: 10 mL
  Filled 2020-01-30: qty 10

## 2020-01-30 MED ORDER — DIPHENHYDRAMINE HCL 25 MG PO CAPS
25.0000 mg | ORAL_CAPSULE | Freq: Once | ORAL | Status: AC
  Administered 2020-01-30: 25 mg via ORAL

## 2020-01-30 MED ORDER — SODIUM CHLORIDE 0.9 % IV SOLN
1200.0000 mg | Freq: Once | INTRAVENOUS | Status: AC
  Administered 2020-01-30: 1200 mg via INTRAVENOUS
  Filled 2020-01-30: qty 20

## 2020-01-30 MED ORDER — FAMOTIDINE 20 MG PO TABS
20.0000 mg | ORAL_TABLET | Freq: Once | ORAL | Status: AC
  Administered 2020-01-30: 20 mg via ORAL

## 2020-01-30 MED ORDER — FAMOTIDINE 20 MG PO TABS
ORAL_TABLET | ORAL | Status: AC
  Filled 2020-01-30: qty 1

## 2020-01-30 MED ORDER — SODIUM CHLORIDE 0.9 % IV SOLN
Freq: Once | INTRAVENOUS | Status: AC
  Filled 2020-01-30: qty 250

## 2020-01-30 MED ORDER — HEPARIN SOD (PORK) LOCK FLUSH 100 UNIT/ML IV SOLN
500.0000 [IU] | Freq: Once | INTRAVENOUS | Status: AC | PRN
  Administered 2020-01-30: 500 [IU]
  Filled 2020-01-30: qty 5

## 2020-01-30 MED ORDER — DIPHENHYDRAMINE HCL 25 MG PO CAPS
ORAL_CAPSULE | ORAL | Status: AC
  Filled 2020-01-30: qty 1

## 2020-01-30 NOTE — Patient Instructions (Signed)

## 2020-01-30 NOTE — Patient Instructions (Signed)
[facility] Discharge Instructions for Patients Receiving Chemotherapy  Today you received the following chemotherapy agents Atezolizumab [facility]).  To help prevent nausea and vomiting after your treatment, we encourage you to take your nausea medication as prescribed.   If you develop nausea and vomiting that is not controlled by your nausea medication, call the clinic.   BELOW ARE SYMPTOMS THAT SHOULD BE REPORTED IMMEDIATELY:  *FEVER GREATER THAN 100.5 F  *CHILLS WITH OR WITHOUT FEVER  NAUSEA AND VOMITING THAT IS NOT CONTROLLED WITH YOUR NAUSEA MEDICATION  *UNUSUAL SHORTNESS OF BREATH  *UNUSUAL BRUISING OR BLEEDING  TENDERNESS IN MOUTH AND THROAT WITH OR WITHOUT PRESENCE OF ULCERS  *URINARY PROBLEMS  *BOWEL PROBLEMS  UNUSUAL RASH Items with * indicate a potential emergency and should be followed up as soon as possible.  Feel free to call the clinic should you have any questions or concerns. The clinic phone number is [phone].  Please show the [place] at check-in to the Emergency Department and triage nurse.

## 2020-02-12 ENCOUNTER — Other Ambulatory Visit: Payer: Self-pay | Admitting: Hematology

## 2020-02-12 DIAGNOSIS — C3491 Malignant neoplasm of unspecified part of right bronchus or lung: Secondary | ICD-10-CM

## 2020-02-20 ENCOUNTER — Inpatient Hospital Stay: Payer: Medicaid Other

## 2020-02-20 ENCOUNTER — Other Ambulatory Visit: Payer: Self-pay

## 2020-02-20 ENCOUNTER — Inpatient Hospital Stay: Payer: Medicaid Other | Attending: Hematology

## 2020-02-20 VITALS — BP 100/71 | HR 76 | Temp 97.9°F | Resp 18 | Wt 136.8 lb

## 2020-02-20 DIAGNOSIS — Z87891 Personal history of nicotine dependence: Secondary | ICD-10-CM | POA: Insufficient documentation

## 2020-02-20 DIAGNOSIS — Z86718 Personal history of other venous thrombosis and embolism: Secondary | ICD-10-CM | POA: Diagnosis not present

## 2020-02-20 DIAGNOSIS — D649 Anemia, unspecified: Secondary | ICD-10-CM | POA: Insufficient documentation

## 2020-02-20 DIAGNOSIS — Z95828 Presence of other vascular implants and grafts: Secondary | ICD-10-CM

## 2020-02-20 DIAGNOSIS — Z5112 Encounter for antineoplastic immunotherapy: Secondary | ICD-10-CM | POA: Insufficient documentation

## 2020-02-20 DIAGNOSIS — C3491 Malignant neoplasm of unspecified part of right bronchus or lung: Secondary | ICD-10-CM

## 2020-02-20 DIAGNOSIS — Z7189 Other specified counseling: Secondary | ICD-10-CM

## 2020-02-20 DIAGNOSIS — Z5111 Encounter for antineoplastic chemotherapy: Secondary | ICD-10-CM

## 2020-02-20 DIAGNOSIS — C3411 Malignant neoplasm of upper lobe, right bronchus or lung: Secondary | ICD-10-CM | POA: Diagnosis present

## 2020-02-20 DIAGNOSIS — Z79899 Other long term (current) drug therapy: Secondary | ICD-10-CM | POA: Diagnosis not present

## 2020-02-20 LAB — CMP (CANCER CENTER ONLY)
ALT: 11 U/L (ref 0–44)
AST: 15 U/L (ref 15–41)
Albumin: 3.6 g/dL (ref 3.5–5.0)
Alkaline Phosphatase: 117 U/L (ref 38–126)
Anion gap: 1 — ABNORMAL LOW (ref 5–15)
BUN: 8 mg/dL (ref 6–20)
CO2: 32 mmol/L (ref 22–32)
Calcium: 9.5 mg/dL (ref 8.9–10.3)
Chloride: 105 mmol/L (ref 98–111)
Creatinine: 0.8 mg/dL (ref 0.61–1.24)
GFR, Estimated: >60 mL/min (ref >60)
Glucose, Bld: 98 mg/dL (ref 70–99)
Potassium: 4.2 mmol/L (ref 3.5–5.1)
Sodium: 138 mmol/L (ref 135–145)
Total Bilirubin: 0.3 mg/dL (ref 0.3–1.2)
Total Protein: 6.8 g/dL (ref 6.5–8.1)

## 2020-02-20 LAB — CBC WITH DIFFERENTIAL/PLATELET
Abs Immature Granulocytes: 0.01 10*3/uL (ref 0.00–0.07)
Basophils Absolute: 0 10*3/uL (ref 0.0–0.1)
Basophils Relative: 1 %
Eosinophils Absolute: 0.1 10*3/uL (ref 0.0–0.5)
Eosinophils Relative: 2 %
HCT: 34.2 % — ABNORMAL LOW (ref 39.0–52.0)
Hemoglobin: 10.8 g/dL — ABNORMAL LOW (ref 13.0–17.0)
Immature Granulocytes: 0 %
Lymphocytes Relative: 25 %
Lymphs Abs: 0.8 10*3/uL (ref 0.7–4.0)
MCH: 26.8 pg (ref 26.0–34.0)
MCHC: 31.6 g/dL (ref 30.0–36.0)
MCV: 84.9 fL (ref 80.0–100.0)
Monocytes Absolute: 0.6 10*3/uL (ref 0.1–1.0)
Monocytes Relative: 17 %
Neutro Abs: 1.9 10*3/uL (ref 1.7–7.7)
Neutrophils Relative %: 55 %
Platelets: 284 10*3/uL (ref 150–400)
RBC: 4.03 MIL/uL — ABNORMAL LOW (ref 4.22–5.81)
RDW: 14.9 % (ref 11.5–15.5)
WBC: 3.4 10*3/uL — ABNORMAL LOW (ref 4.0–10.5)
nRBC: 0 % (ref 0.0–0.2)

## 2020-02-20 LAB — MAGNESIUM: Magnesium: 1.9 mg/dL (ref 1.7–2.4)

## 2020-02-20 MED ORDER — SODIUM CHLORIDE 0.9% FLUSH
10.0000 mL | INTRAVENOUS | Status: DC | PRN
  Administered 2020-02-20: 10 mL
  Filled 2020-02-20: qty 10

## 2020-02-20 MED ORDER — FAMOTIDINE 20 MG PO TABS
20.0000 mg | ORAL_TABLET | Freq: Once | ORAL | Status: AC
  Administered 2020-02-20: 20 mg via ORAL

## 2020-02-20 MED ORDER — DIPHENHYDRAMINE HCL 25 MG PO TABS
25.0000 mg | ORAL_TABLET | Freq: Once | ORAL | Status: AC
  Administered 2020-02-20: 25 mg via ORAL
  Filled 2020-02-20: qty 1

## 2020-02-20 MED ORDER — ACETAMINOPHEN 325 MG PO TABS
ORAL_TABLET | ORAL | Status: AC
  Filled 2020-02-20: qty 2

## 2020-02-20 MED ORDER — DIPHENHYDRAMINE HCL 25 MG PO CAPS
ORAL_CAPSULE | ORAL | Status: AC
  Filled 2020-02-20: qty 1

## 2020-02-20 MED ORDER — SODIUM CHLORIDE 0.9 % IV SOLN
Freq: Once | INTRAVENOUS | Status: AC
  Filled 2020-02-20: qty 250

## 2020-02-20 MED ORDER — FAMOTIDINE 20 MG PO TABS
ORAL_TABLET | ORAL | Status: AC
  Filled 2020-02-20: qty 1

## 2020-02-20 MED ORDER — ACETAMINOPHEN 325 MG PO TABS
650.0000 mg | ORAL_TABLET | Freq: Once | ORAL | Status: AC
  Administered 2020-02-20: 650 mg via ORAL

## 2020-02-20 MED ORDER — SODIUM CHLORIDE 0.9 % IV SOLN
1200.0000 mg | Freq: Once | INTRAVENOUS | Status: AC
  Administered 2020-02-20: 1200 mg via INTRAVENOUS
  Filled 2020-02-20: qty 20

## 2020-02-20 MED ORDER — HEPARIN SOD (PORK) LOCK FLUSH 100 UNIT/ML IV SOLN
500.0000 [IU] | Freq: Once | INTRAVENOUS | Status: AC | PRN
  Administered 2020-02-20: 500 [IU]
  Filled 2020-02-20: qty 5

## 2020-02-20 NOTE — Patient Instructions (Signed)
[facility] Discharge Instructions for Patients Receiving Chemotherapy  Today you received the following chemotherapy agents Atezolizumab [facility]).  To help prevent nausea and vomiting after your treatment, we encourage you to take your nausea medication as prescribed.   If you develop nausea and vomiting that is not controlled by your nausea medication, call the clinic.   BELOW ARE SYMPTOMS THAT SHOULD BE REPORTED IMMEDIATELY:  *FEVER GREATER THAN 100.5 F  *CHILLS WITH OR WITHOUT FEVER  NAUSEA AND VOMITING THAT IS NOT CONTROLLED WITH YOUR NAUSEA MEDICATION  *UNUSUAL SHORTNESS OF BREATH  *UNUSUAL BRUISING OR BLEEDING  TENDERNESS IN MOUTH AND THROAT WITH OR WITHOUT PRESENCE OF ULCERS  *URINARY PROBLEMS  *BOWEL PROBLEMS  UNUSUAL RASH Items with * indicate a potential emergency and should be followed up as soon as possible.  Feel free to call the clinic should you have any questions or concerns. The clinic phone number is [phone].  Please show the [place] at check-in to the Emergency Department and triage nurse.

## 2020-02-20 NOTE — Patient Instructions (Signed)

## 2020-03-11 ENCOUNTER — Other Ambulatory Visit (HOSPITAL_COMMUNITY)
Admission: RE | Admit: 2020-03-11 | Discharge: 2020-03-11 | Disposition: A | Discharge status: Home / Self Care | Payer: Medicaid Other | Source: Ambulatory Visit | Attending: Oral Surgery | Admitting: Oral Surgery

## 2020-03-11 DIAGNOSIS — Z01812 Encounter for preprocedural laboratory examination: Secondary | ICD-10-CM | POA: Diagnosis present

## 2020-03-11 DIAGNOSIS — Z20822 Contact with and (suspected) exposure to covid-19: Secondary | ICD-10-CM | POA: Diagnosis not present

## 2020-03-11 LAB — SARS CORONAVIRUS 2 (TAT 6-24 HRS): SARS Coronavirus 2: NEGATIVE

## 2020-03-11 NOTE — H&P (Signed)
HISTORY AND PHYSICAL  David Berry. is a 54 y.o. male patient with CC: painful teeth No diagnosis found.  Past Medical History:  Diagnosis Date  . Blood in stool   . Cancer ([place])   . Chronic bronchitis with emphysema ([place])   . GERD (gastroesophageal reflux disease)   . Lung cancer ([place])     No current facility-administered medications for this encounter.   Current Outpatient Medications  Medication Sig Dispense Refill  . ELIQUIS 5 MG TABS tablet TAKE 1 TABLET BY MOUTH TWICE DAILY (Patient taking differently: Take 5 mg by mouth 2 (two) times daily. ) 60 tablet 11  . esomeprazole (NEXIUM) 40 MG capsule TAKE 1 CAPSULE(40 MG) BY MOUTH DAILY (Patient taking differently: Take 40 mg by mouth daily. ) 30 capsule 1  . ibuprofen (ADVIL) 800 MG tablet Take 800 mg by mouth every 6 (six) hours as needed for headache or moderate pain.     [name] LORazepam (ATIVAN) 0.5 MG tablet Take 1 tablet (0.5 mg total) by mouth every 4 (four) hours as needed for anxiety. (Patient taking differently: Take 0.5 mg by mouth every 4 (four) hours as needed (Nausea). ) 60 tablet 0  . oxyCODONE (OXY IR/ROXICODONE) 5 MG immediate release tablet Take 1-2 tablets (5-10 mg total) by mouth every 4 (four) hours as needed for severe pain. 60 tablet 0   Allergies  Allergen Reactions  . Pregabalin Other (See Comments)    Homicidal idealizations, pain in lower extremities     Active Problems:   * No active hospital problems. *  Vitals: There were no vitals taken for this visit. Lab results:No results found for this or any previous visit (from the past 5 hour(s)). Radiology Results: No results found. General appearance: alert, cooperative and no distress Head: Normocephalic, without obvious abnormality, atraumatic Eyes: negative Nose: Nares normal. Septum midline. Mucosa normal. No drainage or sinus tenderness. Throat: multiple carious teeth. No purulence, edema, fluctnance, trismus. Pharynx clear. Neck: no adenopathy  and supple, symmetrical, trachea midline Resp: clear to auscultation bilaterally Cardio: regular rate and rhythm, S1, S2 normal, no murmur, click, rub or gallop  Assessment:Multile non-restorable teeth.  Plan:Multiple extractions with alveoloplasty. GA. Day surgery.   Diona Browner 03/11/2020

## 2020-03-12 ENCOUNTER — Inpatient Hospital Stay: Payer: Medicaid Other | Attending: Hematology

## 2020-03-12 ENCOUNTER — Other Ambulatory Visit: Payer: Self-pay | Admitting: *Deleted

## 2020-03-12 ENCOUNTER — Inpatient Hospital Stay: Payer: Medicaid Other

## 2020-03-12 ENCOUNTER — Inpatient Hospital Stay (HOSPITAL_BASED_OUTPATIENT_CLINIC_OR_DEPARTMENT_OTHER): Payer: Medicaid Other | Admitting: Hematology

## 2020-03-12 ENCOUNTER — Other Ambulatory Visit: Payer: Self-pay

## 2020-03-12 VITALS — BP 95/71 | HR 93 | Temp 96.3°F | Resp 18 | Ht 71.0 in | Wt 133.9 lb

## 2020-03-12 DIAGNOSIS — C3411 Malignant neoplasm of upper lobe, right bronchus or lung: Secondary | ICD-10-CM | POA: Diagnosis present

## 2020-03-12 DIAGNOSIS — Z8042 Family history of malignant neoplasm of prostate: Secondary | ICD-10-CM | POA: Insufficient documentation

## 2020-03-12 DIAGNOSIS — Z7901 Long term (current) use of anticoagulants: Secondary | ICD-10-CM | POA: Insufficient documentation

## 2020-03-12 DIAGNOSIS — K219 Gastro-esophageal reflux disease without esophagitis: Secondary | ICD-10-CM | POA: Insufficient documentation

## 2020-03-12 DIAGNOSIS — D649 Anemia, unspecified: Secondary | ICD-10-CM | POA: Insufficient documentation

## 2020-03-12 DIAGNOSIS — C3491 Malignant neoplasm of unspecified part of right bronchus or lung: Secondary | ICD-10-CM

## 2020-03-12 DIAGNOSIS — Z5112 Encounter for antineoplastic immunotherapy: Secondary | ICD-10-CM | POA: Insufficient documentation

## 2020-03-12 DIAGNOSIS — C771 Secondary and unspecified malignant neoplasm of intrathoracic lymph nodes: Secondary | ICD-10-CM | POA: Diagnosis not present

## 2020-03-12 DIAGNOSIS — Z5111 Encounter for antineoplastic chemotherapy: Secondary | ICD-10-CM | POA: Diagnosis not present

## 2020-03-12 DIAGNOSIS — Z7189 Other specified counseling: Secondary | ICD-10-CM

## 2020-03-12 DIAGNOSIS — Z87891 Personal history of nicotine dependence: Secondary | ICD-10-CM | POA: Diagnosis not present

## 2020-03-12 DIAGNOSIS — Z95828 Presence of other vascular implants and grafts: Secondary | ICD-10-CM

## 2020-03-12 DIAGNOSIS — Z79899 Other long term (current) drug therapy: Secondary | ICD-10-CM | POA: Diagnosis not present

## 2020-03-12 LAB — CBC WITH DIFFERENTIAL/PLATELET
Abs Immature Granulocytes: 0 10*3/uL (ref 0.00–0.07)
Basophils Absolute: 0 10*3/uL (ref 0.0–0.1)
Basophils Relative: 1 %
Eosinophils Absolute: 0.1 10*3/uL (ref 0.0–0.5)
Eosinophils Relative: 2 %
HCT: 36.2 % — ABNORMAL LOW (ref 39.0–52.0)
Hemoglobin: 11.4 g/dL — ABNORMAL LOW (ref 13.0–17.0)
Immature Granulocytes: 0 %
Lymphocytes Relative: 21 %
Lymphs Abs: 0.7 10*3/uL (ref 0.7–4.0)
MCH: 27 pg (ref 26.0–34.0)
MCHC: 31.5 g/dL (ref 30.0–36.0)
MCV: 85.8 fL (ref 80.0–100.0)
Monocytes Absolute: 0.5 10*3/uL (ref 0.1–1.0)
Monocytes Relative: 15 %
Neutro Abs: 2.2 10*3/uL (ref 1.7–7.7)
Neutrophils Relative %: 61 %
Platelets: 258 10*3/uL (ref 150–400)
RBC: 4.22 MIL/uL (ref 4.22–5.81)
RDW: 14.6 % (ref 11.5–15.5)
WBC: 3.5 10*3/uL — ABNORMAL LOW (ref 4.0–10.5)
nRBC: 0 % (ref 0.0–0.2)

## 2020-03-12 LAB — CMP (CANCER CENTER ONLY)
ALT: 11 U/L (ref 0–44)
AST: 15 U/L (ref 15–41)
Albumin: 3.7 g/dL (ref 3.5–5.0)
Alkaline Phosphatase: 110 U/L (ref 38–126)
Anion gap: 6 (ref 5–15)
BUN: 9 mg/dL (ref 6–20)
CO2: 27 mmol/L (ref 22–32)
Calcium: 9.5 mg/dL (ref 8.9–10.3)
Chloride: 106 mmol/L (ref 98–111)
Creatinine: 0.87 mg/dL (ref 0.61–1.24)
GFR, Estimated: >60 mL/min (ref >60)
Glucose, Bld: 113 mg/dL — ABNORMAL HIGH (ref 70–99)
Potassium: 4.3 mmol/L (ref 3.5–5.1)
Sodium: 139 mmol/L (ref 135–145)
Total Bilirubin: 0.3 mg/dL (ref 0.3–1.2)
Total Protein: 7 g/dL (ref 6.5–8.1)

## 2020-03-12 MED ORDER — FAMOTIDINE 20 MG PO TABS
ORAL_TABLET | ORAL | Status: AC
  Filled 2020-03-12: qty 1

## 2020-03-12 MED ORDER — HEPARIN SOD (PORK) LOCK FLUSH 100 UNIT/ML IV SOLN
500.0000 [IU] | Freq: Once | INTRAVENOUS | Status: AC | PRN
  Administered 2020-03-12: 500 [IU]
  Filled 2020-03-12: qty 5

## 2020-03-12 MED ORDER — DIPHENHYDRAMINE HCL 25 MG PO CAPS
ORAL_CAPSULE | ORAL | Status: AC
  Filled 2020-03-12: qty 1

## 2020-03-12 MED ORDER — FAMOTIDINE 20 MG PO TABS
20.0000 mg | ORAL_TABLET | Freq: Once | ORAL | Status: AC
  Administered 2020-03-12: 20 mg via ORAL

## 2020-03-12 MED ORDER — ACETAMINOPHEN 325 MG PO TABS
650.0000 mg | ORAL_TABLET | Freq: Once | ORAL | Status: AC
  Administered 2020-03-12: 650 mg via ORAL

## 2020-03-12 MED ORDER — SODIUM CHLORIDE 0.9 % IV SOLN
Freq: Once | INTRAVENOUS | Status: AC
  Filled 2020-03-12: qty 250

## 2020-03-12 MED ORDER — SODIUM CHLORIDE 0.9% FLUSH
10.0000 mL | INTRAVENOUS | Status: DC | PRN
  Administered 2020-03-12: 10 mL
  Filled 2020-03-12: qty 10

## 2020-03-12 MED ORDER — SODIUM CHLORIDE 0.9 % IV SOLN
1200.0000 mg | Freq: Once | INTRAVENOUS | Status: AC
  Administered 2020-03-12: 1200 mg via INTRAVENOUS
  Filled 2020-03-12: qty 20

## 2020-03-12 MED ORDER — DIPHENHYDRAMINE HCL 25 MG PO CAPS
25.0000 mg | ORAL_CAPSULE | Freq: Once | ORAL | Status: AC
  Administered 2020-03-12: 25 mg via ORAL

## 2020-03-12 MED ORDER — ACETAMINOPHEN 325 MG PO TABS
ORAL_TABLET | ORAL | Status: AC
  Filled 2020-03-12: qty 2

## 2020-03-12 NOTE — Patient Instructions (Signed)

## 2020-03-12 NOTE — Patient Instructions (Signed)
[facility] Discharge Instructions for Patients Receiving Chemotherapy  Today you received the following chemotherapy agents: Tecentriq  To help prevent nausea and vomiting after your treatment, we encourage you to take your nausea medication as directed.   If you develop nausea and vomiting that is not controlled by your nausea medication, call the clinic.   BELOW ARE SYMPTOMS THAT SHOULD BE REPORTED IMMEDIATELY:  *FEVER GREATER THAN 100.5 F  *CHILLS WITH OR WITHOUT FEVER  NAUSEA AND VOMITING THAT IS NOT CONTROLLED WITH YOUR NAUSEA MEDICATION  *UNUSUAL SHORTNESS OF BREATH  *UNUSUAL BRUISING OR BLEEDING  TENDERNESS IN MOUTH AND THROAT WITH OR WITHOUT PRESENCE OF ULCERS  *URINARY PROBLEMS  *BOWEL PROBLEMS  UNUSUAL RASH Items with * indicate a potential emergency and should be followed up as soon as possible.  Feel free to call the clinic should you have any questions or concerns. The clinic phone number is [phone].  Please show the CHEMO ALERT CARD at check-in to the Emergency Department and triage nurse.   

## 2020-03-12 NOTE — Progress Notes (Signed)
HEMATOLOGY/ONCOLOGY CLINIC NOTE  Date of Service: 03/12/2020  Patient Care Team: [name], MD as PCP - General (Family Medicine)  CHIEF COMPLAINTS/PURPOSE OF CONSULTATION:  contnued management of Metastatic Poorly differentiated lung adenocarcinoma  HISTORY OF PRESENTING ILLNESS:   David Berry is a 54 year old male with no significant medical history.  He presented to the hospital with progressive right arm and chest pain with new right-sided neck swelling.  The patient states that he was diagnosed with carpal tunnel syndrome received an injection in August.  However, he continued to have progressive pain up and down his right arm that would also radiate to his right chest.  He took ibuprofen with no improvement.  1 week prior to admission, he developed new right sided neck swelling.  He presented to the emergency room for further evaluation.  Work-up in the emergency room was significant for a CT angiogram of the neck and chest which revealed a bulky soft tissue mass in the right neck continuing into the upper chest most compatible with extensive metastatic lymphadenopathy.  This mass measures up to 34 mm and encases multiple vessels including the right upper lobe pulmonary vessels, superior vena cava, and brachiocephalic vessel.  He also had a large poorly defined malignant appearing infiltrative mass measuring 6.2 x 5.6 x 7.9 cm within the right anterior and middle mediastinum with suspected invasion of the distal left brachiocephalic vein and probable tumor occlusion of the right subclavian and jugular veins.  There is also suspected tumor invasion of the upper SVC with string-like narrowing of the SVC but with patency of the SVC just above the right atrium.  The tumor abuts the aorta and great vessels and also results in significant narrowing of the right upper lobe pulmonary arterial vessels.  He also had a CT of the abdomen pelvis which showed a 15 mm heterogeneously enhancing  lesion in the upper pole of the right kidney concerning for renal cell carcinoma, no lymphadenopathy in the abdomen or pelvis, focal hyperenhancement of the anterior left liver most likely related to aberrant venous anatomy/flow, sclerotic lesions in the right acetabulum and left iliac bone are indeterminate.  These are likely bone islands but attention on follow-up is recommended a metastatic disease cannot be excluded.  MRI of the brain with and without contrast did not show any evidence of intracranial metastatic disease. The patient underwent a CT-guided biopsy in interventional radiology on 01/22/2019.  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   When seen today, patient reports ongoing pain and swelling to his right arm and right side of his neck.  Reports ongoing pain to his right chest.  He reports intermittent facial swelling which is worse in the morning and improves throughout the day.  He reports anorexia and a weight loss of 20 pounds over the past few weeks. He has had dizziness for the past few months.  Denies headaches.  He also noted that on the day of admission his peripheral vision was poor.  His vision is now improved.  He denies food getting stuck or difficulty swallowing but does feel as though his throat is "tight."  He reports a nonproductive cough.  Denies fevers and chills.  He is not really having any shortness of breath.  Denies abdominal pain, nausea, vomiting, constipation, diarrhea.  He has not noticed any epistaxis, hemoptysis, hematemesis, hematuria, melena, hematochezia.  The patient is single.  He has 1 daughter  who lives in [place], [place].  Reports occasional alcohol use and currently smokes 3 cigars a day since 1997.  He is currently living in a rooming house with other roommates who all have their own room.  They share a common bathroom and kitchen.  Medical oncology was asked see  the patient to make recommendations regarding his newly diagnosed poorly differentiated carcinoma.  History reviewed. No pertinent past medical history.   Current Treatment:  Atezolizumab maintenance   INTERVAL HISTORY:   David Berry. is a 54 y.o. male who is here for evaluation and management of metastatic poorly differentiated lung adenocarcinoma. He is here for C18 of Atezolizumab. The patient's last visit with [place] was on 01/30/2020. The pt reports that he is doing well overall.  The pt reports that he becomes nauseous at night 1-2 times per week. This varies based on his last meal of the day. He is not eating as well as he would like due to his dental issues. Pt is scheduled for several tooth extractions on 11/05. Pt has held his Eliquis today and plans to hold until after this procedure. He denies any issues with his current immunotherapy.   Lab results today (03/12/20) of CBC w/diff and CMP is as follows: all values are WNL except for WBC at 3.5K, Hgb at 11.4, HCT at 36.2, Glucose at 113.  On review of systems, pt reports occasional nausea, low appetite, occasional right hand swelling and denies mouth sores, rash, diarrhea, pain at port site, abdominal pain and any other symptoms.   MEDICAL HISTORY:  Past Medical History:  Diagnosis Date  . Blood in stool   . Cancer ([place])   . Chronic bronchitis with emphysema ([place])   . GERD (gastroesophageal reflux disease)   . Lung cancer [facility])     SURGICAL HISTORY: Past Surgical History:  Procedure Laterality Date  . APPENDECTOMY     teenager  . INGUINAL HERNIA REPAIR Right 2016, 2017   [facility], 2018 [facility] mesh  . IR IMAGING GUIDED PORT INSERTION  04/26/2019    SOCIAL HISTORY: Social History   Socioeconomic History  . Marital status: Single    Spouse name: Not on file  . Number of children: 1  . Years of education: GED  . Highest education level: Not on file  Occupational History    Comment: fork  lift driver  Tobacco Use  . Smoking status: Former Smoker    Types: Cigars  . Smokeless tobacco: Never Used  . Tobacco comment: smokes 3 black and mild cigars per day  Substance and Sexual Activity  . Alcohol use: Not Currently  . Drug use: No  . Sexual activity: Yes  Other Topics Concern  . Not on file  Social History Narrative   Lives alone   Caffeine- coffee, 20 oz daily, tea occas   Social Determinants of Health   Financial Resource Strain:   . Difficulty of Paying Living Expenses: Not on file  Food Insecurity:   . Worried About Charity fundraiser in the Last Year: Not on file  . Ran Out of Food in the Last Year: Not on file  Transportation Needs:   . Lack of Transportation (Medical): Not on file  . Lack of Transportation (Non-Medical): Not on file  Physical Activity:   . Days of Exercise per Week: Not on file  . Minutes of Exercise per Session: Not on file  Stress:   . Feeling of Stress : Not on file  Social Connections:   . Frequency of Communication with Friends and Family: Not on file  . Frequency of Social Gatherings with Friends and Family: Not on file  . Attends Religious Services: Not on file  . Active Member of Clubs or Organizations: Not on file  . Attends Archivist Meetings: Not on file  . Marital Status: Not on file  Intimate Partner Violence:   . Fear of Current or Ex-Partner: Not on file  . Emotionally Abused: Not on file  . Physically Abused: Not on file  . Sexually Abused: Not on file    FAMILY HISTORY: Family History  Problem Relation Age of Onset  . Prostate cancer Father     ALLERGIES:  is allergic to pregabalin.  MEDICATIONS:  Current Outpatient Medications  Medication Sig Dispense Refill  . ELIQUIS 5 MG TABS tablet TAKE 1 TABLET BY MOUTH TWICE DAILY (Patient taking differently: Take 5 mg by mouth 2 (two) times daily. ) 60 tablet 11  . esomeprazole (NEXIUM) 40 MG capsule TAKE 1 CAPSULE(40 MG) BY MOUTH DAILY (Patient taking  differently: Take 40 mg by mouth daily. ) 30 capsule 1  . ibuprofen (ADVIL) 800 MG tablet Take 800 mg by mouth every 6 (six) hours as needed for headache or moderate pain.     [name] LORazepam (ATIVAN) 0.5 MG tablet Take 1 tablet (0.5 mg total) by mouth every 4 (four) hours as needed for anxiety. (Patient taking differently: Take 0.5 mg by mouth every 4 (four) hours as needed (Nausea). ) 60 tablet 0  . oxyCODONE (OXY IR/ROXICODONE) 5 MG immediate release tablet Take 1-2 tablets (5-10 mg total) by mouth every 4 (four) hours as needed for severe pain. 60 tablet 0   No current facility-administered medications for this visit.    REVIEW OF SYSTEMS:   A 10+ POINT REVIEW OF SYSTEMS WAS OBTAINED including neurology, dermatology, psychiatry, cardiac, respiratory, lymph, extremities, GI, GU, Musculoskeletal, constitutional, breasts, reproductive, HEENT.  All pertinent positives are noted in the HPI.  All others are negative.   PHYSICAL EXAMINATION: ECOG FS:1 - Symptomatic but completely ambulatory  Vitals:   03/12/20 1028  BP: 95/71  Pulse: 93  Resp: 18  Temp: (!) 96.3 F (35.7 C)  SpO2: 100%   Wt Readings from Last 3 Encounters:  03/12/20 133 lb 14.4 oz (60.7 kg)  02/20/20 136 lb 12 oz (62 kg)  01/30/20 136 lb 8 oz (61.9 kg)   Body mass index is 18.68 kg/m.    GENERAL:alert, in no acute distress and comfortable SKIN: no acute rashes, no significant lesions EYES: conjunctiva are pink and non-injected, sclera anicteric OROPHARYNX: MMM, no exudates, no oropharyngeal erythema or ulceration NECK: supple, no JVD LYMPH:  no palpable lymphadenopathy in the cervical, axillary or inguinal regions LUNGS: clear to auscultation b/l with normal respiratory effort HEART: regular rate & rhythm ABDOMEN:  normoactive bowel sounds , non tender, not distended. No palpable hepatosplenomegaly.  Extremity: no pedal edema PSYCH: alert & oriented x 3 with fluent speech NEURO: no focal motor/sensory  deficits  LABORATORY DATA:  I have reviewed the data as listed  . CBC Latest Ref Rng & Units 03/12/2020 02/20/2020 01/30/2020  WBC 4.0 - 10.5 K/uL 3.5(L) 3.4(L) 3.5(L)  Hemoglobin 13.0 - 17.0 g/dL 11.4(L) 10.8(L) 11.3(L)  Hematocrit 39 - 52 % 36.2(L) 34.2(L) 35.9(L)  Platelets 150 - 400 K/uL 258 284 255    . CMP Latest Ref Rng & Units 03/12/2020 02/20/2020 01/30/2020  Glucose 70 - 99 mg/dL 113(H)  98 95  BUN 6 - 20 mg/dL _0 Creatinine 0.61 - 1.24 mg/dL 0.87 0.80 0.80  Sodium 135 - 145 mmol/L 139 138 138  Potassium 3.5 - 5.1 mmol/L 4.3 4.2 4.0  Chloride 98 - 111 mmol/L 106 105 106  CO2 22 - 32 mmol/L 27 32 29  Calcium 8.9 - 10.3 mg/dL 9.5 9.5 9.4  Total Protein 6.5 - 8.1 g/dL 7.0 6.8 6.8  Total Bilirubin 0.3 - 1.2 mg/dL 0.3 0.3 0.3  Alkaline Phos 38 - 126 U/L 110 117 120  AST 15 - 41 U/L _1 ALT 0 - 44 U/L _2 01/22/2019 [facility]: Tumor Mutational Burden     01/22/2019 PD-L1 Immunohistochemistry Analysis    01/22/2019 Soft Tissue Needle Core Biopsy Surgical Pathology     RADIOGRAPHIC STUDIES: I have personally reviewed the radiological images as listed and agreed with the findings in the report. No results found.  ASSESSMENT & PLAN:   This is a 54 year old malewith  1. Metastatic Poorly differentiated lung adenocarcinoma Presented with Large neck mass, mediastinal mass, renal mass, and questionable bone lesions no brain mets on MRI brain 01/22/2019 PD-L1 Immunohistochemistry Analysis which revealed "Tumor Proportion Score (TPS) 50%" 05/16/2019 PET/CT ([id number]) revealed "Radiation changes in the right hemithorax, as above. Improving mediastinal nodal metastases. Prior bulky right cervical metastases have resolved. No evidence of metastatic disease in the abdomen/pelvis." 07/19/2019 Esophagus Scan ([id number]) revealed "Mild stricture at the C6-7 level likely related to prior esophageal surgery. Barium tablet was slow to pass through this  area but did pass through after 1 minutes. Hiatal hernia with moderate gastroesophageal reflux. Moderate stricture above the hiatal hernia likely due to reflux. Barium tablet did not pass this area. There are changes of esophagitis which are likely due to reflux and possibly radiation as well."  10/10/19 of  CT Chest W Contrast ([id number])- no evidence of lung cancer progression at this time.   2. 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 Acute DVT- Right IJ, Right Innominate Vein, and likely also the SVC- related to malignancy+ tobacco-  on long term Eliquis  4. Symptomatic hemorrhoids  5. Normocytic anemia -Likely due to underlying malignancy -Will check ferritin, iron studies, vitamin B12 level, and RBC folate in the a.m.  6.Thrombocytosis -- likely due to paraneoplastic effect of tumor and reactive due to inflammation and tissue inflammation from RT.-- now resolved.  7. Nicotine dependence -He was counseled about tobacco cessation. -has quit since cancer diagnosis.  PLAN:  -Discussed pt labwork today, 03/12/20; blood counts look good, blood chemistries are nml. -No lab or clinical evidence of Lung Adenocarcinoma progression at this time.  -The pt has no prohibitive toxicities from continuing C17D1 of Atezolizumab at this time. Will continue until progression or intolerance. -Advised pt that we typically wait 24 hours after medical procedures to restart anticoagulation. Defer to the recommendation of his surgeon. -Recommend pt increase his intake of healthy, whole grain carbohydrates.  -Last scan was in June. Will repeat scans prior to our next visit. -Will get CT C/A/P in 5 weeks -Will see back with every other treatment   FOLLOW UP: Please schedule next 4 cycles of Atezolizumab with portflush and labs CT chest/abd/pelvis in 5 weeks MD visit every other treatment (next visit in 6 weeks)   The total time spent in the appt was 20 minutes and more than 50% was on  counseling and direct patient cares.  All of the  patient's questions were answered with apparent satisfaction. The patient knows to call the clinic with any problems, questions or concerns.   Sullivan Lone MD [place] AAHIVMS [facility] Hematology/Oncology Physician [facility]  (Office):       [phone] (Work cell):  [phone] (Fax):           [phone]  03/12/2020 10:54 AM  I, Yevette Edwards, am acting as a scribe for Dr. Sullivan Lone.   .I have reviewed the above documentation for accuracy and completeness, and I agree with the above. Brunetta Genera MD

## 2020-03-13 ENCOUNTER — Encounter (HOSPITAL_COMMUNITY): Payer: Self-pay | Admitting: Oral Surgery

## 2020-03-13 ENCOUNTER — Other Ambulatory Visit: Payer: Self-pay

## 2020-03-13 NOTE — Progress Notes (Signed)
Spoke with pt for pre-op call. Pt denies cardiac history, HTN or Diabetes. Pt has had a clot in his neck (September, 2020) and is on Eliquis. He states he was instructed to hold 2 days prior to surgery. Last dose was 03/11/20, PM dose.   Covid test done 03/11/20 and it's negative. Pt states he went to work today because no one told him he couldn't. I explained to him that we would have to do another test on arrival. He will arrive 3 hours prior to surgery start time to get the Covid test done.

## 2020-03-14 ENCOUNTER — Ambulatory Visit (HOSPITAL_COMMUNITY): Payer: Medicaid Other | Admitting: Registered Nurse

## 2020-03-14 ENCOUNTER — Encounter (HOSPITAL_COMMUNITY): Payer: Self-pay | Admitting: Oral Surgery

## 2020-03-14 ENCOUNTER — Ambulatory Visit (HOSPITAL_COMMUNITY)
Admission: RE | Admit: 2020-03-14 | Discharge: 2020-03-14 | Disposition: A | Discharge status: Home / Self Care | Payer: Medicaid Other | Attending: Oral Surgery | Admitting: Oral Surgery

## 2020-03-14 ENCOUNTER — Other Ambulatory Visit: Payer: Self-pay

## 2020-03-14 ENCOUNTER — Encounter (HOSPITAL_COMMUNITY)
Admission: RE | Disposition: A | Discharge status: Home / Self Care | Payer: Self-pay | Source: Home / Self Care | Attending: Oral Surgery

## 2020-03-14 DIAGNOSIS — K056 Periodontal disease, unspecified: Secondary | ICD-10-CM | POA: Insufficient documentation

## 2020-03-14 DIAGNOSIS — M899 Disorder of bone, unspecified: Secondary | ICD-10-CM | POA: Insufficient documentation

## 2020-03-14 DIAGNOSIS — Z7901 Long term (current) use of anticoagulants: Secondary | ICD-10-CM | POA: Insufficient documentation

## 2020-03-14 DIAGNOSIS — Z20822 Contact with and (suspected) exposure to covid-19: Secondary | ICD-10-CM | POA: Insufficient documentation

## 2020-03-14 DIAGNOSIS — Z79899 Other long term (current) drug therapy: Secondary | ICD-10-CM | POA: Diagnosis not present

## 2020-03-14 DIAGNOSIS — K029 Dental caries, unspecified: Secondary | ICD-10-CM | POA: Insufficient documentation

## 2020-03-14 DIAGNOSIS — K0889 Other specified disorders of teeth and supporting structures: Secondary | ICD-10-CM | POA: Diagnosis not present

## 2020-03-14 DIAGNOSIS — Z888 Allergy status to other drugs, medicaments and biological substances status: Secondary | ICD-10-CM | POA: Diagnosis not present

## 2020-03-14 DIAGNOSIS — M5416 Radiculopathy, lumbar region: Secondary | ICD-10-CM | POA: Diagnosis not present

## 2020-03-14 HISTORY — DX: Bipolar disorder, unspecified: F31.9

## 2020-03-14 HISTORY — PX: TOOTH EXTRACTION: SHX859

## 2020-03-14 HISTORY — DX: Peripheral vascular disease, unspecified: I73.9

## 2020-03-14 HISTORY — DX: Anxiety disorder, unspecified: F41.9

## 2020-03-14 LAB — SARS CORONAVIRUS 2 BY RT PCR (HOSPITAL ORDER, PERFORMED IN ~~LOC~~ HOSPITAL LAB): SARS Coronavirus 2: NEGATIVE

## 2020-03-14 SURGERY — DENTAL RESTORATION/EXTRACTIONS
Anesthesia: General

## 2020-03-14 MED ORDER — ONDANSETRON HCL 4 MG/2ML IJ SOLN
INTRAMUSCULAR | Status: DC | PRN
  Administered 2020-03-14: 4 mg via INTRAVENOUS

## 2020-03-14 MED ORDER — ONDANSETRON HCL 4 MG/2ML IJ SOLN: 4.0000 mg | Freq: Once | INTRAMUSCULAR | Status: DC | PRN

## 2020-03-14 MED ORDER — FENTANYL CITRATE (PF) 100 MCG/2ML IJ SOLN: 25.0000 ug | INTRAMUSCULAR | Status: DC | PRN

## 2020-03-14 MED ORDER — AMOXICILLIN 500 MG PO CAPS: 500.0000 mg | ORAL_CAPSULE | Freq: Three times a day (TID) | ORAL | 0 refills | Status: DC

## 2020-03-14 MED ORDER — BUPIVACAINE-EPINEPHRINE 0.25% -1:200000 IJ SOLN
INTRAMUSCULAR | Status: DC | PRN
  Administered 2020-03-14: 20 mL

## 2020-03-14 MED ORDER — FENTANYL CITRATE (PF) 250 MCG/5ML IJ SOLN
INTRAMUSCULAR | Status: DC | PRN
  Administered 2020-03-14: 100 ug via INTRAVENOUS
  Administered 2020-03-14: 50 ug via INTRAVENOUS

## 2020-03-14 MED ORDER — MIDAZOLAM HCL 2 MG/2ML IJ SOLN
INTRAMUSCULAR | Status: AC
  Filled 2020-03-14: qty 2

## 2020-03-14 MED ORDER — ONDANSETRON HCL 4 MG/2ML IJ SOLN
INTRAMUSCULAR | Status: AC
  Filled 2020-03-14: qty 2

## 2020-03-14 MED ORDER — MIDAZOLAM HCL 5 MG/5ML IJ SOLN
INTRAMUSCULAR | Status: DC | PRN
  Administered 2020-03-14: 2 mg via INTRAVENOUS

## 2020-03-14 MED ORDER — PROPOFOL 10 MG/ML IV BOLUS
INTRAVENOUS | Status: DC | PRN
  Administered 2020-03-14: 130 mg via INTRAVENOUS

## 2020-03-14 MED ORDER — OXYCODONE HCL 5 MG/5ML PO SOLN: 5.0000 mg | Freq: Once | ORAL | Status: DC | PRN

## 2020-03-14 MED ORDER — LIDOCAINE 2% (20 MG/ML) 5 ML SYRINGE
INTRAMUSCULAR | Status: AC
  Filled 2020-03-14: qty 5

## 2020-03-14 MED ORDER — CHLORHEXIDINE GLUCONATE 0.12 % MT SOLN: 15.0000 mL | Freq: Once | OROMUCOSAL | Status: AC

## 2020-03-14 MED ORDER — ACETAMINOPHEN 325 MG PO TABS: 325.0000 mg | ORAL_TABLET | ORAL | Status: DC | PRN

## 2020-03-14 MED ORDER — PROPOFOL 10 MG/ML IV BOLUS
INTRAVENOUS | Status: AC
  Filled 2020-03-14: qty 20

## 2020-03-14 MED ORDER — OXYCODONE HCL 5 MG PO TABS: 5.0000 mg | ORAL_TABLET | Freq: Once | ORAL | Status: DC | PRN

## 2020-03-14 MED ORDER — ROCURONIUM BROMIDE 10 MG/ML (PF) SYRINGE
PREFILLED_SYRINGE | INTRAVENOUS | Status: DC | PRN
  Administered 2020-03-14: 60 mg via INTRAVENOUS

## 2020-03-14 MED ORDER — CHLORHEXIDINE GLUCONATE 0.12 % MT SOLN
OROMUCOSAL | Status: AC
  Administered 2020-03-14: 15 mL via OROMUCOSAL
  Filled 2020-03-14: qty 15

## 2020-03-14 MED ORDER — SUGAMMADEX SODIUM 200 MG/2ML IV SOLN
INTRAVENOUS | Status: DC | PRN
  Administered 2020-03-14 (×2): 100 mg via INTRAVENOUS

## 2020-03-14 MED ORDER — ROCURONIUM BROMIDE 10 MG/ML (PF) SYRINGE
PREFILLED_SYRINGE | INTRAVENOUS | Status: AC
  Filled 2020-03-14: qty 10

## 2020-03-14 MED ORDER — DEXAMETHASONE SODIUM PHOSPHATE 10 MG/ML IJ SOLN
INTRAMUSCULAR | Status: DC | PRN
  Administered 2020-03-14: 5 mg via INTRAVENOUS

## 2020-03-14 MED ORDER — FENTANYL CITRATE (PF) 250 MCG/5ML IJ SOLN
INTRAMUSCULAR | Status: AC
  Filled 2020-03-14: qty 5

## 2020-03-14 MED ORDER — ACETAMINOPHEN 10 MG/ML IV SOLN: 1000.0000 mg | Freq: Once | INTRAVENOUS | Status: DC | PRN

## 2020-03-14 MED ORDER — SODIUM CHLORIDE 0.9 % IR SOLN
Status: DC | PRN
  Administered 2020-03-14: 1000 mL

## 2020-03-14 MED ORDER — OXYCODONE-ACETAMINOPHEN 5-325 MG PO TABS
1.0000 | ORAL_TABLET | ORAL | 0 refills | Status: DC | PRN
Start: 2020-03-14 — End: 2020-10-02

## 2020-03-14 MED ORDER — CEFAZOLIN SODIUM-DEXTROSE 2-4 GM/100ML-% IV SOLN
2.0000 g | INTRAVENOUS | Status: AC
  Administered 2020-03-14: 2 g via INTRAVENOUS
  Filled 2020-03-14: qty 100

## 2020-03-14 MED ORDER — LIDOCAINE 2% (20 MG/ML) 5 ML SYRINGE
INTRAMUSCULAR | Status: DC | PRN
  Administered 2020-03-14: 40 mg via INTRAVENOUS

## 2020-03-14 MED ORDER — ACETAMINOPHEN 160 MG/5ML PO SOLN: 325.0000 mg | ORAL | Status: DC | PRN

## 2020-03-14 MED ORDER — MEPERIDINE HCL 25 MG/ML IJ SOLN: 6.2500 mg | INTRAMUSCULAR | Status: DC | PRN

## 2020-03-14 MED ORDER — ORAL CARE MOUTH RINSE: 15.0000 mL | Freq: Once | OROMUCOSAL | Status: AC

## 2020-03-14 MED ORDER — DEXAMETHASONE SODIUM PHOSPHATE 10 MG/ML IJ SOLN
INTRAMUSCULAR | Status: AC
  Filled 2020-03-14: qty 1

## 2020-03-14 MED ORDER — LACTATED RINGERS IV SOLN: INTRAVENOUS | Status: DC

## 2020-03-14 SURGICAL SUPPLY — 29 items
BLADE SURG 15 STRL LF DISP TIS (BLADE) ×1 IMPLANT
BLADE SURG 15 STRL SS (BLADE) ×1
BUR CROSS CUT FISSURE 1.6 (BURR) ×2 IMPLANT
BUR EGG ELITE 4.0 (BURR) ×2 IMPLANT
CANISTER SUCT 3000ML PPV (MISCELLANEOUS) ×2 IMPLANT
COVER SURGICAL LIGHT HANDLE (MISCELLANEOUS) ×2 IMPLANT
DECANTER SPIKE VIAL GLASS SM (MISCELLANEOUS) ×2 IMPLANT
DRAPE U-SHAPE 76X120 STRL (DRAPES) ×2 IMPLANT
GAUZE PACKING FOLDED 2  STR (GAUZE/BANDAGES/DRESSINGS) ×1
GAUZE PACKING FOLDED 2 STR (GAUZE/BANDAGES/DRESSINGS) ×1 IMPLANT
GLOVE BIO SURGEON STRL SZ8 (GLOVE) ×2 IMPLANT
GOWN STRL REUS W/ TWL LRG LVL3 (GOWN DISPOSABLE) ×1 IMPLANT
GOWN STRL REUS W/ TWL XL LVL3 (GOWN DISPOSABLE) ×1 IMPLANT
GOWN STRL REUS W/TWL LRG LVL3 (GOWN DISPOSABLE) ×1
GOWN STRL REUS W/TWL XL LVL3 (GOWN DISPOSABLE) ×1
IV NS 1000ML (IV SOLUTION) ×1
IV NS 1000ML BAXH (IV SOLUTION) ×1 IMPLANT
KIT BASIN OR (CUSTOM PROCEDURE TRAY) ×2 IMPLANT
KIT TURNOVER KIT B (KITS) ×2 IMPLANT
NDL HYPO 25GX1X1/2 BEV (NEEDLE) ×2 IMPLANT
NEEDLE HYPO 25GX1X1/2 BEV (NEEDLE) ×4 IMPLANT
NS IRRIG 1000ML POUR BTL (IV SOLUTION) ×2 IMPLANT
PAD ARMBOARD 7.5X6 YLW CONV (MISCELLANEOUS) ×2 IMPLANT
SLEEVE IRRIGATION ELITE 7 (MISCELLANEOUS) ×2 IMPLANT
SUT CHROMIC 3 0 PS 2 (SUTURE) ×2 IMPLANT
SYR CONTROL 10ML LL (SYRINGE) ×2 IMPLANT
TRAY ENT MC OR (CUSTOM PROCEDURE TRAY) ×2 IMPLANT
TUBING IRRIGATION (MISCELLANEOUS) ×2 IMPLANT
YANKAUER SUCT BULB TIP NO VENT (SUCTIONS) ×2 IMPLANT

## 2020-03-14 NOTE — Anesthesia Preprocedure Evaluation (Addendum)
Anesthesia Evaluation  Patient identified by MRN, date of birth, ID band Patient awake    Reviewed: Allergy & Precautions, NPO status , Patient's Chart, lab work & pertinent test results  Airway Mallampati: I       Dental  (+) Poor Dentition, Loose   Pulmonary former smoker,    Pulmonary exam normal        Cardiovascular negative cardio ROS Normal cardiovascular exam     Neuro/Psych PSYCHIATRIC DISORDERS Anxiety    GI/Hepatic Neg liver ROS, GERD  Medicated and Controlled,  Endo/Other  negative endocrine ROS  Renal/GU negative Renal ROS  negative genitourinary   Musculoskeletal negative musculoskeletal ROS (+)   Abdominal Normal abdominal exam  (+)   Peds  Hematology  (+) anemia ,   Anesthesia Other Findings   Reproductive/Obstetrics                            Anesthesia Physical Anesthesia Plan  ASA: II  Anesthesia Plan: General   Post-op Pain Management:    Induction: Intravenous  PONV Risk Score and Plan: 2 and Ondansetron and Midazolam  Airway Management Planned: Nasal ETT  Additional Equipment: None  Intra-op Plan:   Post-operative Plan: Extubation in OR  Informed Consent: I have reviewed the patients History and Physical, chart, labs and discussed the procedure including the risks, benefits and alternatives for the proposed anesthesia with the patient or authorized representative who has indicated his/her understanding and acceptance.     Dental advisory given  Plan Discussed with: CRNA  Anesthesia Plan Comments:         Anesthesia Quick Evaluation

## 2020-03-14 NOTE — Transfer of Care (Signed)
Immediate Anesthesia Transfer of Care Note  Patient: David Berry.  Procedure(s) Performed: DENTAL RESTORATION/EXTRACTIONS (N/A )  Patient Location: PACU  Anesthesia Type:General  Level of Consciousness: awake, alert  and oriented  Airway & Oxygen Therapy: Patient Spontanous Breathing  Post-op Assessment: Report given to RN and Post -op Vital signs reviewed and stable  Post vital signs: Reviewed and stable  Last Vitals:  Vitals Value Taken Time  BP 142/99 03/14/20 1025  Temp    Pulse 87 03/14/20 1027  Resp 12 03/14/20 1027  SpO2 100 % 03/14/20 1027  Vitals shown include unvalidated device data.  Last Pain:  Vitals:   03/14/20 0723  TempSrc:   PainSc: 0-No pain      Patients Stated Pain Goal: 3 (83/37/44 5146)  Complications: No complications documented.

## 2020-03-14 NOTE — Anesthesia Procedure Notes (Signed)
Procedure Name: Intubation Date/Time: 03/14/2020 9:14 AM Performed by: Trinna Post., CRNA Pre-anesthesia Checklist: Patient identified, Emergency Drugs available, Suction available, Patient being monitored and Timeout performed Patient Re-evaluated:Patient Re-evaluated prior to induction Oxygen Delivery Method: Circle system utilized Preoxygenation: Pre-oxygenation with 100% oxygen Induction Type: IV induction Ventilation: Mask ventilation without difficulty Laryngoscope Size: Mac and 4 Grade View: Grade I Nasal Tubes: Right, Nasal prep performed, Nasal Rae and Magill forceps - small, utilized Tube size: 7.0 mm Number of attempts: 1 Placement Confirmation: ETT inserted through vocal cords under direct vision,  positive ETCO2 and breath sounds checked- equal and bilateral Tube secured with: Tape Dental Injury: Teeth and Oropharynx as per pre-operative assessment

## 2020-03-14 NOTE — Op Note (Signed)
03/14/2020  10:12 AM  PATIENT:  David Berry.  54 y.o. male  PRE-OPERATIVE DIAGNOSIS:  NON-RESTORABLE TEETH # 1, 2, 4, 5, 6, 7, 8, 9, 10, 11, 12, 13, 17, 18  POST-OPERATIVE DIAGNOSIS:  SAME+  LEFT MAXILLARY BUCCAL EXOSTOSIS   PROCEDURE:  Procedure(s): EXTRACTION  TEETH # 1, 2, 4, 5, 6, 7, 8, 9, 10, 11, 12, 13, 17, 18, ALVEOLOPLASTY, REMOVAL LEFT MAXILLARY EXOSTOSIS   SURGEON:  Surgeon(s): Diona Browner, DDS  ANESTHESIA:   local and general  EBL:  minimal  DRAINS: none   SPECIMEN:  No Specimen  COUNTS:  YES  PLAN OF CARE: Discharge to home after PACU  PATIENT DISPOSITION:  PACU - hemodynamically stable.   PROCEDURE DETAILS: Dictation #[id number]  David Berry, DMD 03/14/2020 10:12 AM

## 2020-03-14 NOTE — H&P (Signed)
H&P documentation  -History and Physical Reviewed  -Patient has been re-examined  -No change in the plan of care  David Berry  

## 2020-03-14 NOTE — Op Note (Signed)
NAME: David JR., [place] JS:[id number] ACCOUNT [id number] DATE OF BIRTH:[birth year] FACILITY: [facility] LOCATION: [facility] PHYSICIAN:Laia Wiley M. Abshir Paolini, DDS  OPERATIVE REPORT  DATE OF PROCEDURE:  03/14/2020  PREOPERATIVE DIAGNOSIS:  Nonrestorable teeth secondary to dental caries and periodontal disease numbers 1, 2, 4, 5, 6, 7, 8, 9, 10, 11, 12, 13, 17, 18.  POSTOPERATIVE DIAGNOSES:   1.  Nonrestorable teeth secondary to dental caries and periodontal disease numbers 1, 2, 4, 5, 6, 7, 8, 9, 10, 11, 12, 13, 17, 18.   2.  Left maxillary buccal exostosis.  PROCEDURE:   1.  Extraction of teeth numbers 1, 2, 4, 5, 6, 7, 8, 9, 10, 11, 12, 13, 17, 18.   2.  Alveoplasty right and left maxilla.   3.  Removal of left maxillary buccal exostosis.  SURGEON:  Diona Browner, DDS  ANESTHESIA:  General with nasal intubation, Dr. Linna Caprice attending.  DESCRIPTION OF PROCEDURE:  The patient was taken to the operating room and placed on the table in supine position.  General anesthesia was administered and a nasal endotracheal tube was placed and secured.  The eyes were protected and the patient was  draped for surgery.  A timeout was performed.  The posterior pharynx was suctioned and a throat pack was placed.  Quarter percent Marcaine, 1:200,000 epinephrine was infiltrated in an inferior alveolar block on the left side and it was administered in  buccal and palatal infiltration in the maxilla around the teeth to be removed.  A bite block was placed on the right side of the mouth and then the sweetheart retractor was used to retract the tongue.  A 15 blade used to make an incision around teeth  numbers 18 and 19.  The teeth were elevated with a 301 elevator and removed from the mouth with the dental forceps.  The sockets were curetted, irrigated and closed with 3-0 chromic.  Then, in the posterior left maxilla, there was noted to be an  approximately 4 x 4 mm buccal exostosis near the maxillary  tuberosity on the buccal aspect.  It was decided this would interfere with denture fit, so additional local anesthesia was administered around this area buccally and palatally.  Then, a 15 blade  was used to make an incision over the buccal exostosis,  starting on the alveolar crest and carried forward to tooth 13 and then the incision was made buccally in the gingival sulcus around the anterior teeth until tooth #6 was encountered.  Then, a  palatal incision in the gingival sulcus was also made around these teeth 13 through 6.  The periosteum was reflected.  The teeth were elevated with a 301 elevator.  Teeth 7, 8, 9 and 10 were removed with the dental forceps.  Teeth numbers 11, 12 and 13  fractured upon attempted removal.  This required removal of circumferential bone using a Stryker handpiece with the fissure bur under irrigation.  The roots of teeth numbers 11, 12 and 13 were then removed.  Then, the egg bur was used to remove the  buccal exostosis and alveoplasty was performed using the egg bur and then the bone file was used to further smooth the areas.  Then, the left maxilla was closed with 3-0 chromic after irrigating the areas.  Then the bite block was repositioned to the  other side of the mouth.  A 15 blade was used to make an incision around teeth numbers 1 and 2, 4, 5 and 6 in the buccal and  palatal gingival sulcus.  The periosteum was reflected.  The teeth were elevated and removed with the forceps.  The sockets were  curetted.  The tissue was trimmed to provide primary closure.  The periosteum was reflected to expose the alveolar crest.  Alveoplasty was performed using the egg bur, followed by the bone file.  Then, the area was irrigated and closed with 3-0 chromic.   Then, the oral cavity was irrigated and suctioned.  Additional local anesthesia was administered and then the oral cavity was suctioned and the throat pack was removed.  The patient was left in care of anesthesia for extubation  and transported to  recovery room with plans for discharge home through day surgery.  ESTIMATED BLOOD LOSS:  Minimal.  COMPLICATIONS:  None.  SPECIMENS:  None.  VN/NUANCE  D:03/14/2020 T:03/14/2020 JOB:[id number]

## 2020-03-16 ENCOUNTER — Encounter (HOSPITAL_COMMUNITY): Payer: Self-pay | Admitting: Oral Surgery

## 2020-03-17 NOTE — Anesthesia Postprocedure Evaluation (Signed)
Anesthesia Post Note  Patient: David Berry.  Procedure(s) Performed: DENTAL RESTORATION/EXTRACTIONS (N/A )     Patient location during evaluation: PACU Anesthesia Type: General Level of consciousness: awake Pain management: pain level controlled Vital Signs Assessment: post-procedure vital signs reviewed and stable Respiratory status: spontaneous breathing Cardiovascular status: stable Postop Assessment: no apparent nausea or vomiting Anesthetic complications: no   No complications documented.  Last Vitals:  Vitals:   03/14/20 1025 03/14/20 1040  BP: (!) 142/99 (!) 139/95  Pulse: 95 88  Resp: 18 12  Temp: (!) 36.3 C   SpO2: 99% 100%    Last Pain:  Vitals:   03/14/20 1025  TempSrc:   PainSc: [name]

## 2020-04-01 ENCOUNTER — Other Ambulatory Visit: Payer: Self-pay | Admitting: Hematology

## 2020-04-02 ENCOUNTER — Inpatient Hospital Stay: Payer: Medicaid Other

## 2020-04-02 ENCOUNTER — Other Ambulatory Visit: Payer: Self-pay

## 2020-04-02 VITALS — BP 95/67 | HR 90 | Temp 98.4°F | Resp 16 | Wt 132.5 lb

## 2020-04-02 DIAGNOSIS — C3491 Malignant neoplasm of unspecified part of right bronchus or lung: Secondary | ICD-10-CM

## 2020-04-02 DIAGNOSIS — Z95828 Presence of other vascular implants and grafts: Secondary | ICD-10-CM

## 2020-04-02 DIAGNOSIS — Z5112 Encounter for antineoplastic immunotherapy: Secondary | ICD-10-CM | POA: Diagnosis not present

## 2020-04-02 DIAGNOSIS — Z7189 Other specified counseling: Secondary | ICD-10-CM

## 2020-04-02 DIAGNOSIS — Z5111 Encounter for antineoplastic chemotherapy: Secondary | ICD-10-CM

## 2020-04-02 LAB — CBC WITH DIFFERENTIAL/PLATELET
Abs Immature Granulocytes: 0.01 10*3/uL (ref 0.00–0.07)
Basophils Absolute: 0 10*3/uL (ref 0.0–0.1)
Basophils Relative: 1 %
Eosinophils Absolute: 0.1 10*3/uL (ref 0.0–0.5)
Eosinophils Relative: 1 %
HCT: 34 % — ABNORMAL LOW (ref 39.0–52.0)
Hemoglobin: 10.8 g/dL — ABNORMAL LOW (ref 13.0–17.0)
Immature Granulocytes: 0 %
Lymphocytes Relative: 26 %
Lymphs Abs: 1.1 10*3/uL (ref 0.7–4.0)
MCH: 26.6 pg (ref 26.0–34.0)
MCHC: 31.8 g/dL (ref 30.0–36.0)
MCV: 83.7 fL (ref 80.0–100.0)
Monocytes Absolute: 0.6 10*3/uL (ref 0.1–1.0)
Monocytes Relative: 15 %
Neutro Abs: 2.3 10*3/uL (ref 1.7–7.7)
Neutrophils Relative %: 57 %
Platelets: 320 10*3/uL (ref 150–400)
RBC: 4.06 MIL/uL — ABNORMAL LOW (ref 4.22–5.81)
RDW: 14.2 % (ref 11.5–15.5)
WBC: 4.1 10*3/uL (ref 4.0–10.5)
nRBC: 0 % (ref 0.0–0.2)

## 2020-04-02 LAB — CMP (CANCER CENTER ONLY)
ALT: 7 U/L (ref 0–44)
AST: 14 U/L — ABNORMAL LOW (ref 15–41)
Albumin: 3.7 g/dL (ref 3.5–5.0)
Alkaline Phosphatase: 113 U/L (ref 38–126)
Anion gap: 7 (ref 5–15)
BUN: 8 mg/dL (ref 6–20)
CO2: 25 mmol/L (ref 22–32)
Calcium: 9.4 mg/dL (ref 8.9–10.3)
Chloride: 106 mmol/L (ref 98–111)
Creatinine: 0.94 mg/dL (ref 0.61–1.24)
GFR, Estimated: >60 mL/min (ref >60)
Glucose, Bld: 95 mg/dL (ref 70–99)
Potassium: 4.2 mmol/L (ref 3.5–5.1)
Sodium: 138 mmol/L (ref 135–145)
Total Bilirubin: 0.3 mg/dL (ref 0.3–1.2)
Total Protein: 7.1 g/dL (ref 6.5–8.1)

## 2020-04-02 MED ORDER — ACETAMINOPHEN 325 MG PO TABS
650.0000 mg | ORAL_TABLET | Freq: Once | ORAL | Status: AC
  Administered 2020-04-02: 650 mg via ORAL

## 2020-04-02 MED ORDER — FAMOTIDINE 20 MG PO TABS
ORAL_TABLET | ORAL | Status: AC
  Filled 2020-04-02: qty 1

## 2020-04-02 MED ORDER — FAMOTIDINE 20 MG PO TABS
20.0000 mg | ORAL_TABLET | Freq: Once | ORAL | Status: AC
  Administered 2020-04-02: 20 mg via ORAL

## 2020-04-02 MED ORDER — HEPARIN SOD (PORK) LOCK FLUSH 100 UNIT/ML IV SOLN
500.0000 [IU] | Freq: Once | INTRAVENOUS | Status: AC | PRN
  Administered 2020-04-02: 500 [IU]
  Filled 2020-04-02: qty 5

## 2020-04-02 MED ORDER — SODIUM CHLORIDE 0.9 % IV SOLN
Freq: Once | INTRAVENOUS | Status: AC
  Filled 2020-04-02: qty 250

## 2020-04-02 MED ORDER — SODIUM CHLORIDE 0.9% FLUSH
10.0000 mL | INTRAVENOUS | Status: DC | PRN
  Administered 2020-04-02: 10 mL
  Filled 2020-04-02: qty 10

## 2020-04-02 MED ORDER — DIPHENHYDRAMINE HCL 25 MG PO CAPS
ORAL_CAPSULE | ORAL | Status: AC
  Filled 2020-04-02: qty 1

## 2020-04-02 MED ORDER — DIPHENHYDRAMINE HCL 25 MG PO TABS
25.0000 mg | ORAL_TABLET | Freq: Once | ORAL | Status: AC
  Administered 2020-04-02: 25 mg via ORAL
  Filled 2020-04-02: qty 1

## 2020-04-02 MED ORDER — SODIUM CHLORIDE 0.9 % IV SOLN
1200.0000 mg | Freq: Once | INTRAVENOUS | Status: AC
  Administered 2020-04-02: 1200 mg via INTRAVENOUS
  Filled 2020-04-02: qty 20

## 2020-04-02 NOTE — Patient Instructions (Signed)
[facility] Discharge Instructions for Patients Receiving Chemotherapy  Today you received the following chemotherapy agents: Tecentriq  To help prevent nausea and vomiting after your treatment, we encourage you to take your nausea medication as directed.   If you develop nausea and vomiting that is not controlled by your nausea medication, call the clinic.   BELOW ARE SYMPTOMS THAT SHOULD BE REPORTED IMMEDIATELY:  *FEVER GREATER THAN 100.5 F  *CHILLS WITH OR WITHOUT FEVER  NAUSEA AND VOMITING THAT IS NOT CONTROLLED WITH YOUR NAUSEA MEDICATION  *UNUSUAL SHORTNESS OF BREATH  *UNUSUAL BRUISING OR BLEEDING  TENDERNESS IN MOUTH AND THROAT WITH OR WITHOUT PRESENCE OF ULCERS  *URINARY PROBLEMS  *BOWEL PROBLEMS  UNUSUAL RASH Items with * indicate a potential emergency and should be followed up as soon as possible.  Feel free to call the clinic should you have any questions or concerns. The clinic phone number is [phone].  Please show the CHEMO ALERT CARD at check-in to the Emergency Department and triage nurse.   

## 2020-04-17 ENCOUNTER — Ambulatory Visit (HOSPITAL_COMMUNITY): Payer: Medicaid Other

## 2020-04-23 ENCOUNTER — Inpatient Hospital Stay: Payer: Medicaid Other

## 2020-04-23 ENCOUNTER — Inpatient Hospital Stay (HOSPITAL_BASED_OUTPATIENT_CLINIC_OR_DEPARTMENT_OTHER): Payer: Medicaid Other | Admitting: Hematology

## 2020-04-23 ENCOUNTER — Other Ambulatory Visit: Payer: Self-pay

## 2020-04-23 ENCOUNTER — Inpatient Hospital Stay: Payer: Medicaid Other | Attending: Hematology

## 2020-04-23 VITALS — BP 97/75 | HR 80 | Temp 96.0°F | Resp 18 | Ht 71.0 in | Wt 135.4 lb

## 2020-04-23 DIAGNOSIS — C3491 Malignant neoplasm of unspecified part of right bronchus or lung: Secondary | ICD-10-CM

## 2020-04-23 DIAGNOSIS — Z7901 Long term (current) use of anticoagulants: Secondary | ICD-10-CM | POA: Insufficient documentation

## 2020-04-23 DIAGNOSIS — Z5111 Encounter for antineoplastic chemotherapy: Secondary | ICD-10-CM | POA: Diagnosis not present

## 2020-04-23 DIAGNOSIS — Z95828 Presence of other vascular implants and grafts: Secondary | ICD-10-CM

## 2020-04-23 DIAGNOSIS — Z87891 Personal history of nicotine dependence: Secondary | ICD-10-CM | POA: Diagnosis not present

## 2020-04-23 DIAGNOSIS — Z79899 Other long term (current) drug therapy: Secondary | ICD-10-CM | POA: Insufficient documentation

## 2020-04-23 DIAGNOSIS — R519 Headache, unspecified: Secondary | ICD-10-CM | POA: Insufficient documentation

## 2020-04-23 DIAGNOSIS — Z5112 Encounter for antineoplastic immunotherapy: Secondary | ICD-10-CM | POA: Insufficient documentation

## 2020-04-23 DIAGNOSIS — Z86718 Personal history of other venous thrombosis and embolism: Secondary | ICD-10-CM | POA: Insufficient documentation

## 2020-04-23 DIAGNOSIS — Z7189 Other specified counseling: Secondary | ICD-10-CM

## 2020-04-23 DIAGNOSIS — Z8042 Family history of malignant neoplasm of prostate: Secondary | ICD-10-CM | POA: Diagnosis not present

## 2020-04-23 DIAGNOSIS — C3411 Malignant neoplasm of upper lobe, right bronchus or lung: Secondary | ICD-10-CM | POA: Diagnosis present

## 2020-04-23 LAB — CBC WITH DIFFERENTIAL/PLATELET
Abs Immature Granulocytes: 0.01 10*3/uL (ref 0.00–0.07)
Basophils Absolute: 0 10*3/uL (ref 0.0–0.1)
Basophils Relative: 1 %
Eosinophils Absolute: 0.1 10*3/uL (ref 0.0–0.5)
Eosinophils Relative: 1 %
HCT: 34.2 % — ABNORMAL LOW (ref 39.0–52.0)
Hemoglobin: 10.7 g/dL — ABNORMAL LOW (ref 13.0–17.0)
Immature Granulocytes: 0 %
Lymphocytes Relative: 25 %
Lymphs Abs: 1 10*3/uL (ref 0.7–4.0)
MCH: 26.5 pg (ref 26.0–34.0)
MCHC: 31.3 g/dL (ref 30.0–36.0)
MCV: 84.7 fL (ref 80.0–100.0)
Monocytes Absolute: 0.6 10*3/uL (ref 0.1–1.0)
Monocytes Relative: 15 %
Neutro Abs: 2.3 10*3/uL (ref 1.7–7.7)
Neutrophils Relative %: 58 %
Platelets: 272 10*3/uL (ref 150–400)
RBC: 4.04 MIL/uL — ABNORMAL LOW (ref 4.22–5.81)
RDW: 13.9 % (ref 11.5–15.5)
WBC: 4 10*3/uL (ref 4.0–10.5)
nRBC: 0 % (ref 0.0–0.2)

## 2020-04-23 LAB — CMP (CANCER CENTER ONLY)
ALT: 10 U/L (ref 0–44)
AST: 14 U/L — ABNORMAL LOW (ref 15–41)
Albumin: 3.9 g/dL (ref 3.5–5.0)
Alkaline Phosphatase: 120 U/L (ref 38–126)
Anion gap: 3 — ABNORMAL LOW (ref 5–15)
BUN: 10 mg/dL (ref 6–20)
CO2: 30 mmol/L (ref 22–32)
Calcium: 9.5 mg/dL (ref 8.9–10.3)
Chloride: 108 mmol/L (ref 98–111)
Creatinine: 0.9 mg/dL (ref 0.61–1.24)
GFR, Estimated: >60 mL/min (ref >60)
Glucose, Bld: 95 mg/dL (ref 70–99)
Potassium: 4.2 mmol/L (ref 3.5–5.1)
Sodium: 141 mmol/L (ref 135–145)
Total Bilirubin: 0.4 mg/dL (ref 0.3–1.2)
Total Protein: 7.4 g/dL (ref 6.5–8.1)

## 2020-04-23 LAB — MAGNESIUM: Magnesium: 1.9 mg/dL (ref 1.7–2.4)

## 2020-04-23 MED ORDER — SODIUM CHLORIDE 0.9 % IV SOLN
1200.0000 mg | Freq: Once | INTRAVENOUS | Status: AC
  Administered 2020-04-23: 14:00:00 1200 mg via INTRAVENOUS
  Filled 2020-04-23: qty 20

## 2020-04-23 MED ORDER — SODIUM CHLORIDE 0.9 % IV SOLN
Freq: Once | INTRAVENOUS | Status: AC
  Filled 2020-04-23: qty 250

## 2020-04-23 MED ORDER — FAMOTIDINE 20 MG PO TABS
ORAL_TABLET | ORAL | Status: AC
  Filled 2020-04-23: qty 1

## 2020-04-23 MED ORDER — HEPARIN SOD (PORK) LOCK FLUSH 100 UNIT/ML IV SOLN
500.0000 [IU] | Freq: Once | INTRAVENOUS | Status: AC | PRN
  Administered 2020-04-23: 15:00:00 500 [IU]
  Filled 2020-04-23: qty 5

## 2020-04-23 MED ORDER — DIPHENHYDRAMINE HCL 25 MG PO CAPS
ORAL_CAPSULE | ORAL | Status: AC
  Filled 2020-04-23: qty 1

## 2020-04-23 MED ORDER — SODIUM CHLORIDE 0.9% FLUSH
10.0000 mL | INTRAVENOUS | Status: DC | PRN
  Administered 2020-04-23: 12:00:00 10 mL
  Filled 2020-04-23: qty 10

## 2020-04-23 MED ORDER — DIPHENHYDRAMINE HCL 25 MG PO TABS
25.0000 mg | ORAL_TABLET | Freq: Once | ORAL | Status: AC
  Administered 2020-04-23: 14:00:00 25 mg via ORAL
  Filled 2020-04-23: qty 1

## 2020-04-23 MED ORDER — ACETAMINOPHEN 325 MG PO TABS
650.0000 mg | ORAL_TABLET | Freq: Once | ORAL | Status: AC
  Administered 2020-04-23: 14:00:00 650 mg via ORAL

## 2020-04-23 MED ORDER — FAMOTIDINE 20 MG PO TABS
20.0000 mg | ORAL_TABLET | Freq: Once | ORAL | Status: AC
  Administered 2020-04-23: 14:00:00 20 mg via ORAL

## 2020-04-23 MED ORDER — SODIUM CHLORIDE 0.9% FLUSH
10.0000 mL | INTRAVENOUS | Status: DC | PRN
  Administered 2020-04-23: 15:00:00 10 mL
  Filled 2020-04-23: qty 10

## 2020-04-23 MED ORDER — ACETAMINOPHEN 325 MG PO TABS
ORAL_TABLET | ORAL | Status: AC
  Filled 2020-04-23: qty 2

## 2020-04-23 NOTE — Patient Instructions (Addendum)
[facility] Scheduling [place] Discharge Instructions for Patients Receiving Chemotherapy  Today you received the following chemotherapy agents Tecentriq  To help prevent nausea and vomiting after your treatment, we encourage you to take your nausea medication as directed.  If you develop nausea and vomiting that is not controlled by your nausea medication, call the clinic.   BELOW ARE SYMPTOMS THAT SHOULD BE REPORTED IMMEDIATELY:  *FEVER GREATER THAN 100.5 F  *CHILLS WITH OR WITHOUT FEVER  NAUSEA AND VOMITING THAT IS NOT CONTROLLED WITH YOUR NAUSEA MEDICATION  *UNUSUAL SHORTNESS OF BREATH  *UNUSUAL BRUISING OR BLEEDING  TENDERNESS IN MOUTH AND THROAT WITH OR WITHOUT PRESENCE OF ULCERS  *URINARY PROBLEMS  *BOWEL PROBLEMS  UNUSUAL RASH Items with * indicate a potential emergency and should be followed up as soon as possible.  Feel free to call the clinic should you have any questions or concerns. The clinic phone number is [phone].  Please show the [place] at check-in to the Emergency Department and triage nurse.

## 2020-04-23 NOTE — Progress Notes (Signed)
HEMATOLOGY/ONCOLOGY CLINIC NOTE  Date of Service: 04/23/2020  Patient Care Team: [name], MD as PCP - General (Family Medicine)  CHIEF COMPLAINTS/PURPOSE OF CONSULTATION:  contnued management of Metastatic Poorly differentiated lung adenocarcinoma  HISTORY OF PRESENTING ILLNESS:   David Berry is a 54 year old male with no significant medical history.  He presented to the hospital with progressive right arm and chest pain with new right-sided neck swelling.  The patient states that he was diagnosed with carpal tunnel syndrome received an injection in August.  However, he continued to have progressive pain up and down his right arm that would also radiate to his right chest.  He took ibuprofen with no improvement.  1 week prior to admission, he developed new right sided neck swelling.  He presented to the emergency room for further evaluation.  Work-up in the emergency room was significant for a CT angiogram of the neck and chest which revealed a bulky soft tissue mass in the right neck continuing into the upper chest most compatible with extensive metastatic lymphadenopathy.  This mass measures up to 34 mm and encases multiple vessels including the right upper lobe pulmonary vessels, superior vena cava, and brachiocephalic vessel.  He also had a large poorly defined malignant appearing infiltrative mass measuring 6.2 x 5.6 x 7.9 cm within the right anterior and middle mediastinum with suspected invasion of the distal left brachiocephalic vein and probable tumor occlusion of the right subclavian and jugular veins.  There is also suspected tumor invasion of the upper SVC with string-like narrowing of the SVC but with patency of the SVC just above the right atrium.  The tumor abuts the aorta and great vessels and also results in significant narrowing of the right upper lobe pulmonary arterial vessels.  He also had a CT of the abdomen pelvis which showed a 15 mm heterogeneously enhancing  lesion in the upper pole of the right kidney concerning for renal cell carcinoma, no lymphadenopathy in the abdomen or pelvis, focal hyperenhancement of the anterior left liver most likely related to aberrant venous anatomy/flow, sclerotic lesions in the right acetabulum and left iliac bone are indeterminate.  These are likely bone islands but attention on follow-up is recommended a metastatic disease cannot be excluded.  MRI of the brain with and without contrast did not show any evidence of intracranial metastatic disease. The patient underwent a CT-guided biopsy in interventional radiology on 01/22/2019.  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   When seen today, patient reports ongoing pain and swelling to his right arm and right side of his neck.  Reports ongoing pain to his right chest.  He reports intermittent facial swelling which is worse in the morning and improves throughout the day.  He reports anorexia and a weight loss of 20 pounds over the past few weeks. He has had dizziness for the past few months.  Denies headaches.  He also noted that on the day of admission his peripheral vision was poor.  His vision is now improved.  He denies food getting stuck or difficulty swallowing but does feel as though his throat is "tight."  He reports a nonproductive cough.  Denies fevers and chills.  He is not really having any shortness of breath.  Denies abdominal pain, nausea, vomiting, constipation, diarrhea.  He has not noticed any epistaxis, hemoptysis, hematemesis, hematuria, melena, hematochezia.  The patient is single.  He has 1 daughter  who lives in [place], [place].  Reports occasional alcohol use and currently smokes 3 cigars a day since 1997.  He is currently living in a rooming house with other roommates who all have their own room.  They share a common bathroom and kitchen.  Medical oncology was asked see  the patient to make recommendations regarding his newly diagnosed poorly differentiated carcinoma.  History reviewed. No pertinent past medical history.   Current Treatment:  Atezolizumab maintenance   INTERVAL HISTORY:  Naod Sweetland. is a 54 y.o. male who is here for evaluation and management of metastatic poorly differentiated lung adenocarcinoma. He is here for C20 of Atezolizumab. The patient's last visit with [place] was on 03/12/2020. The pt reports that he is doing well overall.  The pt reports that he continues to experience occasional headaches and arm swelling, as well as some nausea at night. He denies any new symptoms. Pt has not been able to get his CT C/A/P due to insurance issues.  Lab results today (04/23/20) of CBC w/diff and CMP is as follows: all values are WNL except for RBC at 4.04, Hgb at 10.7, HCT at 34.2, AST at 14, Anion Gap at 3. 04/23/2020 Magnesium at 1.9  On review of systems, pt reports headaches, nausea, arm swelling and denies pain at port site, new lumps/bumps, leg swelling, diarrhea, skin rash, back pain, abdominal pain, constipation, testicular pain/swelling and any other symptoms.   MEDICAL HISTORY:  Past Medical History:  Diagnosis Date  . Anxiety   . Bipolar disorder ([place])   . Blood in stool   . Cancer ([place])   . 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 GERD (gastroesophageal reflux disease)   . Lung cancer ([place])   . Peripheral vascular disease ([place])    blood clot in neck Sept 12, 2020    SURGICAL HISTORY: Past Surgical History:  Procedure Laterality Date  . APPENDECTOMY     teenager  . INGUINAL HERNIA REPAIR Right 2016, 2017   [facility], 2018 [facility] mesh  . IR IMAGING GUIDED PORT INSERTION  04/26/2019  . TOOTH EXTRACTION N/A 03/14/2020   Procedure: DENTAL RESTORATION/EXTRACTIONS;  Surgeon: Diona Browner, DDS;  Location: [facility];  Service: Oral Surgery;  Laterality: N/A;    SOCIAL HISTORY: Social  History   Socioeconomic History  . Marital status: Single    Spouse name: Not on file  . Number of children: 1  . Years of education: GED  . Highest education level: Not on file  Occupational History    Comment: fork lift driver  Tobacco Use  . Smoking status: Former Smoker    Types: Cigars    Quit date: 01/20/2019    Years since quitting: 1.2  . Smokeless tobacco: Never Used  . Tobacco comment: smokes 3 black and mild cigars per day  Substance and Sexual Activity  . Alcohol use: Not Currently  . Drug use: No  . Sexual activity: Yes  Other Topics Concern  . Not on file  Social History Narrative   Lives alone   Caffeine- coffee, 20 oz daily, tea occas   Social Determinants of Health   Financial Resource Strain: Not on file  Food Insecurity: Not on file  Transportation Needs: Not on file  Physical Activity: Not on file  Stress: Not on file  Social Connections: Not on file  Intimate Partner Violence: Not on file    FAMILY HISTORY: Family History  Problem Relation Age  of Onset  . Prostate cancer Father     ALLERGIES:  is allergic to pregabalin.  MEDICATIONS:  Current Outpatient Medications  Medication Sig Dispense Refill  . amoxicillin (AMOXIL) 500 MG capsule Take 1 capsule (500 mg total) by mouth 3 (three) times daily. (Patient not taking: Reported on 04/02/2020) 21 capsule 0  . ELIQUIS 5 MG TABS tablet TAKE 1 TABLET BY MOUTH TWICE DAILY (Patient taking differently: Take 5 mg by mouth 2 (two) times daily. ) 60 tablet 11  . esomeprazole (NEXIUM) 40 MG capsule TAKE 1 CAPSULE(40 MG) BY MOUTH DAILY (Patient taking differently: Take 40 mg by mouth daily. ) 30 capsule 1  . ibuprofen (ADVIL) 800 MG tablet Take 800 mg by mouth every 6 (six) hours as needed for headache or moderate pain.     [name] LORazepam (ATIVAN) 0.5 MG tablet Take 1 tablet (0.5 mg total) by mouth every 4 (four) hours as needed for anxiety. (Patient taking differently: Take 0.5 mg by mouth every 4 (four) hours  as needed (Nausea). ) 60 tablet 0  . oxyCODONE-acetaminophen (PERCOCET) 5-325 MG tablet Take 1 tablet by mouth every 4 (four) hours as needed. (Patient not taking: Reported on 04/02/2020) 30 tablet 0   No current facility-administered medications for this visit.   Facility-Administered Medications Ordered in Other Visits  Medication Dose Route Frequency Provider Last Rate Last Admin  . atezolizumab (TECENTRIQ) 1,200 mg in sodium chloride 0.9 % 250 mL chemo infusion  1,200 mg Intravenous Once Brunetta Genera, MD      . heparin lock flush 100 unit/mL  500 Units Intracatheter Once PRN Brunetta Genera, MD      . sodium chloride flush (NS) 0.9 % injection 10 mL  10 mL Intracatheter PRN Brunetta Genera, MD        REVIEW OF SYSTEMS:   A 10+ POINT REVIEW OF SYSTEMS WAS OBTAINED including neurology, dermatology, psychiatry, cardiac, respiratory, lymph, extremities, GI, GU, Musculoskeletal, constitutional, breasts, reproductive, HEENT.  All pertinent positives are noted in the HPI.  All others are negative.   PHYSICAL EXAMINATION: ECOG FS:1 - Symptomatic but completely ambulatory  Vitals:   04/23/20 1221  BP: 97/75  Pulse: 80  Resp: 18  Temp: (!) 96 F (35.6 C)  SpO2: 100%   Wt Readings from Last 3 Encounters:  04/23/20 135 lb 6.4 oz (61.4 kg)  04/02/20 132 lb 8 oz (60.1 kg)  03/14/20 133 lb (60.3 kg)   Body mass index is 18.88 kg/m.    GENERAL:alert, in no acute distress and comfortable SKIN: no acute rashes, no significant lesions EYES: conjunctiva are pink and non-injected, sclera anicteric OROPHARYNX: MMM, no exudates, no oropharyngeal erythema or ulceration NECK: supple, no JVD LYMPH:  no palpable lymphadenopathy in the cervical, axillary or inguinal regions LUNGS: clear to auscultation b/l with normal respiratory effort HEART: regular rate & rhythm ABDOMEN:  normoactive bowel sounds , non tender, not distended. No palpable hepatosplenomegaly.  Extremity: no  pedal edema PSYCH: alert & oriented x 3 with fluent speech NEURO: no focal motor/sensory deficits  LABORATORY DATA:  I have reviewed the data as listed  . CBC Latest Ref Rng & Units 04/23/2020 04/02/2020 03/12/2020  WBC 4.0 - 10.5 K/uL 4.0 4.1 3.5(L)  Hemoglobin 13.0 - 17.0 g/dL 10.7(L) 10.8(L) 11.4(L)  Hematocrit 39.0 - 52.0 % 34.2(L) 34.0(L) 36.2(L)  Platelets 150 - 400 K/uL 272 320 258    . CMP Latest Ref Rng & Units 04/23/2020 04/02/2020 03/12/2020  Glucose 70 -  99 mg/dL 95 95 113(H)  BUN 6 - 20 mg/dL $Remove'10 8 9  'tdxvxWM$ Creatinine 0.61 - 1.24 mg/dL 0.90 0.94 0.87  Sodium 135 - 145 mmol/L 141 138 139  Potassium 3.5 - 5.1 mmol/L 4.2 4.2 4.3  Chloride 98 - 111 mmol/L 108 106 106  CO2 22 - 32 mmol/L $RemoveB'30 25 27  'erLujhuO$ Calcium 8.9 - 10.3 mg/dL 9.5 9.4 9.5  Total Protein 6.5 - 8.1 g/dL 7.4 7.1 7.0  Total Bilirubin 0.3 - 1.2 mg/dL 0.4 0.3 0.3  Alkaline Phos 38 - 126 U/L 120 113 110  AST 15 - 41 U/L 14(L) 14(L) 15  ALT 0 - 44 U/L $Remo'10 7 11    'LUNTG$ 01/22/2019 [facility]: Tumor Mutational Burden     01/22/2019 PD-L1 Immunohistochemistry Analysis    01/22/2019 Soft Tissue Needle Core Biopsy Surgical Pathology     RADIOGRAPHIC STUDIES: I have personally reviewed the radiological images as listed and agreed with the findings in the report. No results found.  ASSESSMENT & PLAN:   This is a 54 year old malewith  1. Metastatic Poorly differentiated lung adenocarcinoma Presented with Large neck mass, mediastinal mass, renal mass, and questionable bone lesions no brain mets on MRI brain 01/22/2019 PD-L1 Immunohistochemistry Analysis which revealed "Tumor Proportion Score (TPS) 50%" 05/16/2019 PET/CT ([id number]) revealed "Radiation changes in the right hemithorax, as above. Improving mediastinal nodal metastases. Prior bulky right cervical metastases have resolved. No evidence of metastatic disease in the abdomen/pelvis." 07/19/2019 Esophagus Scan ([id number]) revealed "Mild stricture at the C6-7  level likely related to prior esophageal surgery. Barium tablet was slow to pass through this area but did pass through after 1 minutes. Hiatal hernia with moderate gastroesophageal reflux. Moderate stricture above the hiatal hernia likely due to reflux. Barium tablet did not pass this area. There are changes of esophagitis which are likely due to reflux and possibly radiation as well."  10/10/19 of  CT Chest W Contrast ([id number])- no evidence of lung cancer progression at this time.   2. 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 Symptomatic hemorrhoids  5. Normocytic anemia -Likely due to underlying malignancy  6.Thrombocytosis -- likely due to paraneoplastic effect of tumor and reactive due to inflammation and tissue inflammation from RT.-- now resolved.  7. Nicotine dependence -He was counseled about tobacco cessation. -has quit since cancer diagnosis.  PLAN:  -Discussed pt labwork today, 04/23/20; blood counts and chemistries are steady, Magnesium is WNL.  -No lab or clinical evidence of Lung Adenocarcinoma progression at this time.  -The pt has no prohibitive toxicities from continuing C20D1 of Atezolizumab at this time. -Advised pt that we would continue Atezolizumab until progression or intolerance.  -Will scheduled CT C/A/P in 1 week -Will see back in 6 weeks with labs    FOLLOW UP: Please schedule next 4 cycles of Atezolizumab with portflush and labs CT chest/abd/pelvis in 1 weeks MD visit every other treatment (next visit in 6 weeks)   The total time spent in the appt was 20 minutes and more than 50% was on counseling and direct patient cares.  All of the patient's questions were answered with apparent satisfaction. The patient knows to call the clinic with any problems, questions or concerns.   Sullivan Lone MD [place] [facility]IV[facility]  [facility] Hematology/Oncology Physician [facility]  (Office):       [phone] (Work cell):  [phone] (Fax):           [phone]  04/23/2020 2:07 PM  I, Yevette Edwards, am acting as a scribe for Dr. Sullivan Lone.   .I have reviewed the above documentation for accuracy and completeness, and I agree with the above. Brunetta Genera MD

## 2020-05-14 ENCOUNTER — Inpatient Hospital Stay: Payer: Medicaid Other | Attending: Hematology

## 2020-05-14 ENCOUNTER — Other Ambulatory Visit: Payer: Self-pay

## 2020-05-14 ENCOUNTER — Inpatient Hospital Stay: Payer: Medicaid Other

## 2020-05-14 VITALS — BP 117/72 | HR 94 | Temp 97.9°F | Resp 18 | Wt 135.5 lb

## 2020-05-14 DIAGNOSIS — Z5112 Encounter for antineoplastic immunotherapy: Secondary | ICD-10-CM | POA: Diagnosis present

## 2020-05-14 DIAGNOSIS — Z7189 Other specified counseling: Secondary | ICD-10-CM

## 2020-05-14 DIAGNOSIS — C3491 Malignant neoplasm of unspecified part of right bronchus or lung: Secondary | ICD-10-CM

## 2020-05-14 DIAGNOSIS — C3411 Malignant neoplasm of upper lobe, right bronchus or lung: Secondary | ICD-10-CM | POA: Insufficient documentation

## 2020-05-14 DIAGNOSIS — Z5111 Encounter for antineoplastic chemotherapy: Secondary | ICD-10-CM

## 2020-05-14 DIAGNOSIS — Z95828 Presence of other vascular implants and grafts: Secondary | ICD-10-CM

## 2020-05-14 LAB — CMP (CANCER CENTER ONLY)
ALT: 9 U/L (ref 0–44)
AST: 13 U/L — ABNORMAL LOW (ref 15–41)
Albumin: 3.7 g/dL (ref 3.5–5.0)
Alkaline Phosphatase: 109 U/L (ref 38–126)
Anion gap: 8 (ref 5–15)
BUN: 9 mg/dL (ref 6–20)
CO2: 25 mmol/L (ref 22–32)
Calcium: 9.2 mg/dL (ref 8.9–10.3)
Chloride: 107 mmol/L (ref 98–111)
Creatinine: 0.94 mg/dL (ref 0.61–1.24)
GFR, Estimated: >60 mL/min (ref >60)
Glucose, Bld: 95 mg/dL (ref 70–99)
Potassium: 4.2 mmol/L (ref 3.5–5.1)
Sodium: 140 mmol/L (ref 135–145)
Total Bilirubin: 0.4 mg/dL (ref 0.3–1.2)
Total Protein: 7 g/dL (ref 6.5–8.1)

## 2020-05-14 LAB — CBC WITH DIFFERENTIAL/PLATELET
Abs Immature Granulocytes: 0.01 10*3/uL (ref 0.00–0.07)
Basophils Absolute: 0 10*3/uL (ref 0.0–0.1)
Basophils Relative: 1 %
Eosinophils Absolute: 0.1 10*3/uL (ref 0.0–0.5)
Eosinophils Relative: 1 %
HCT: 35.6 % — ABNORMAL LOW (ref 39.0–52.0)
Hemoglobin: 11.1 g/dL — ABNORMAL LOW (ref 13.0–17.0)
Immature Granulocytes: 0 %
Lymphocytes Relative: 29 %
Lymphs Abs: 1.2 10*3/uL (ref 0.7–4.0)
MCH: 26.1 pg (ref 26.0–34.0)
MCHC: 31.2 g/dL (ref 30.0–36.0)
MCV: 83.6 fL (ref 80.0–100.0)
Monocytes Absolute: 0.6 10*3/uL (ref 0.1–1.0)
Monocytes Relative: 15 %
Neutro Abs: 2.2 10*3/uL (ref 1.7–7.7)
Neutrophils Relative %: 54 %
Platelets: 343 10*3/uL (ref 150–400)
RBC: 4.26 MIL/uL (ref 4.22–5.81)
RDW: 14.1 % (ref 11.5–15.5)
WBC: 4 10*3/uL (ref 4.0–10.5)
nRBC: 0 % (ref 0.0–0.2)

## 2020-05-14 LAB — MAGNESIUM: Magnesium: 1.8 mg/dL (ref 1.7–2.4)

## 2020-05-14 MED ORDER — SODIUM CHLORIDE 0.9 % IV SOLN
Freq: Once | INTRAVENOUS | Status: AC
  Filled 2020-05-14: qty 250

## 2020-05-14 MED ORDER — DIPHENHYDRAMINE HCL 25 MG PO CAPS
ORAL_CAPSULE | ORAL | Status: AC
  Filled 2020-05-14: qty 1

## 2020-05-14 MED ORDER — ACETAMINOPHEN 325 MG PO TABS
650.0000 mg | ORAL_TABLET | Freq: Once | ORAL | Status: AC
  Administered 2020-05-14: 650 mg via ORAL

## 2020-05-14 MED ORDER — DIPHENHYDRAMINE HCL 25 MG PO TABS
25.0000 mg | ORAL_TABLET | Freq: Once | ORAL | Status: AC
  Administered 2020-05-14: 25 mg via ORAL
  Filled 2020-05-14: qty 1

## 2020-05-14 MED ORDER — HEPARIN SOD (PORK) LOCK FLUSH 100 UNIT/ML IV SOLN
500.0000 [IU] | Freq: Once | INTRAVENOUS | Status: AC | PRN
  Administered 2020-05-14: 500 [IU]
  Filled 2020-05-14: qty 5

## 2020-05-14 MED ORDER — SODIUM CHLORIDE 0.9% FLUSH
10.0000 mL | INTRAVENOUS | Status: DC | PRN
  Administered 2020-05-14: 10 mL
  Filled 2020-05-14: qty 10

## 2020-05-14 MED ORDER — FAMOTIDINE 20 MG PO TABS
ORAL_TABLET | ORAL | Status: AC
  Filled 2020-05-14: qty 1

## 2020-05-14 MED ORDER — ACETAMINOPHEN 325 MG PO TABS
ORAL_TABLET | ORAL | Status: AC
  Filled 2020-05-14: qty 2

## 2020-05-14 MED ORDER — FAMOTIDINE 20 MG PO TABS
20.0000 mg | ORAL_TABLET | Freq: Once | ORAL | Status: AC
  Administered 2020-05-14: 20 mg via ORAL

## 2020-05-14 MED ORDER — SODIUM CHLORIDE 0.9 % IV SOLN
1200.0000 mg | Freq: Once | INTRAVENOUS | Status: AC
  Administered 2020-05-14: 1200 mg via INTRAVENOUS
  Filled 2020-05-14: qty 20

## 2020-05-14 NOTE — Patient Instructions (Signed)

## 2020-05-14 NOTE — Patient Instructions (Signed)
[facility] Discharge Instructions for Patients Receiving Chemotherapy  Today you received the following chemotherapy agents: atezolizumab.  To help prevent nausea and vomiting after your treatment, we encourage you to take your nausea medication as directed.   If you develop nausea and vomiting that is not controlled by your nausea medication, call the clinic.   BELOW ARE SYMPTOMS THAT SHOULD BE REPORTED IMMEDIATELY:  *FEVER GREATER THAN 100.5 F  *CHILLS WITH OR WITHOUT FEVER  NAUSEA AND VOMITING THAT IS NOT CONTROLLED WITH YOUR NAUSEA MEDICATION  *UNUSUAL SHORTNESS OF BREATH  *UNUSUAL BRUISING OR BLEEDING  TENDERNESS IN MOUTH AND THROAT WITH OR WITHOUT PRESENCE OF ULCERS  *URINARY PROBLEMS  *BOWEL PROBLEMS  UNUSUAL RASH Items with * indicate a potential emergency and should be followed up as soon as possible.  Feel free to call the clinic should you have any questions or concerns. The clinic phone number is [phone].  Please show the [place] at check-in to the Emergency Department and triage nurse.

## 2020-05-17 ENCOUNTER — Other Ambulatory Visit: Payer: Self-pay | Admitting: Hematology

## 2020-05-17 DIAGNOSIS — C3491 Malignant neoplasm of unspecified part of right bronchus or lung: Secondary | ICD-10-CM

## 2020-05-19 NOTE — Telephone Encounter (Signed)
Patient request refill

## 2020-05-21 ENCOUNTER — Ambulatory Visit (HOSPITAL_COMMUNITY)
Admission: RE | Admit: 2020-05-21 | Discharge: 2020-05-21 | Disposition: A | Discharge status: Home / Self Care | Payer: Medicaid Other | Source: Ambulatory Visit | Attending: Hematology | Admitting: Hematology

## 2020-05-21 ENCOUNTER — Encounter (HOSPITAL_COMMUNITY): Payer: Self-pay

## 2020-05-21 ENCOUNTER — Other Ambulatory Visit: Payer: Self-pay

## 2020-05-21 DIAGNOSIS — C3491 Malignant neoplasm of unspecified part of right bronchus or lung: Secondary | ICD-10-CM | POA: Insufficient documentation

## 2020-05-21 DIAGNOSIS — C3490 Malignant neoplasm of unspecified part of unspecified bronchus or lung: Secondary | ICD-10-CM | POA: Diagnosis not present

## 2020-05-21 DIAGNOSIS — C3431 Malignant neoplasm of lower lobe, right bronchus or lung: Secondary | ICD-10-CM | POA: Diagnosis not present

## 2020-05-21 DIAGNOSIS — I700 Atherosclerosis of aorta: Secondary | ICD-10-CM | POA: Diagnosis not present

## 2020-05-21 DIAGNOSIS — N200 Calculus of kidney: Secondary | ICD-10-CM | POA: Diagnosis not present

## 2020-05-21 DIAGNOSIS — Z5111 Encounter for antineoplastic chemotherapy: Secondary | ICD-10-CM | POA: Diagnosis present

## 2020-05-21 IMAGING — CT CT CHEST-ABD-PELV W/ CM
2 of 5 series · 13 of 36 positions shown, 15 images · IV contrast (OMNIPAQUE)
Comparison: Bone CT chest [DATE] and CT AP [DATE]

CLINICAL DATA: Restaging non-small cell lung cancer.

EXAM:
CT CHEST, ABDOMEN, AND PELVIS WITH CONTRAST
TECHNIQUE: Multidetector CT imaging of the chest, abdomen and pelvis was
performed following the standard protocol during bolus
administration of intravenous contrast.
CONTRAST:  100mL OMNIPAQUE IOHEXOL 300 MG/ML  SOLN

[Series 2: cap with · axial · 0.68mm/px · z∈[-512,+24]mm · 10 of 131 slices shown, 12 images]
[im 12/131  mediastinal]
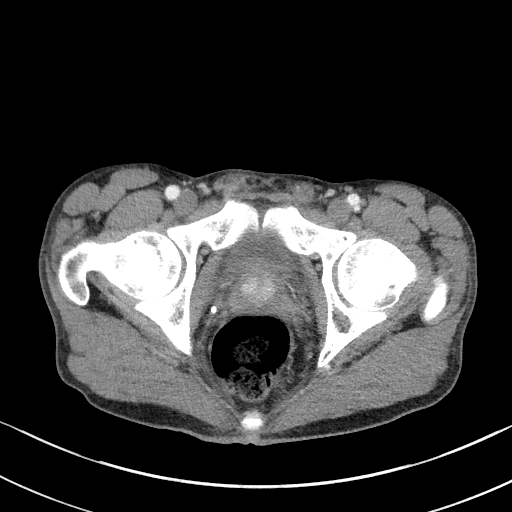
[im 12/131  bone]
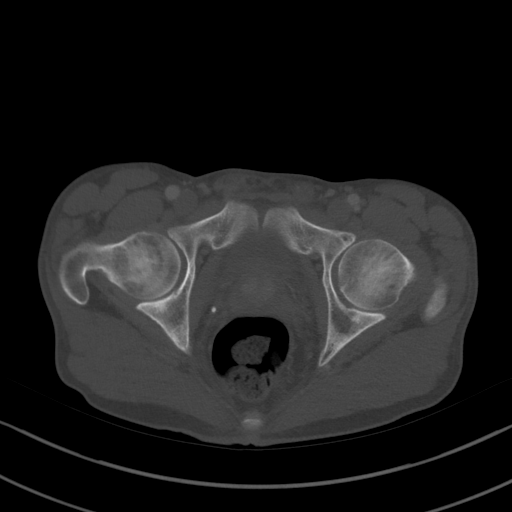
[im 24/131  mediastinal]
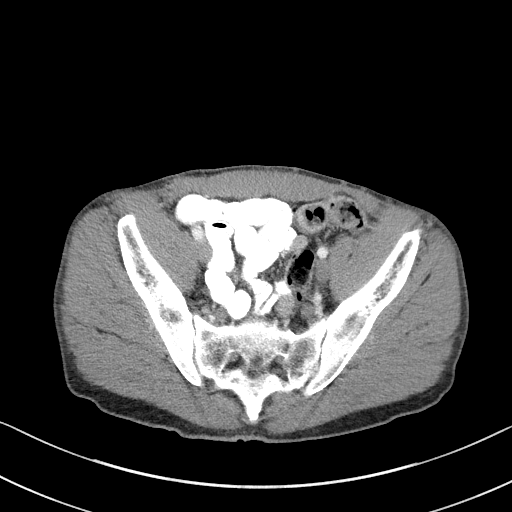
[im 36/131  mediastinal]
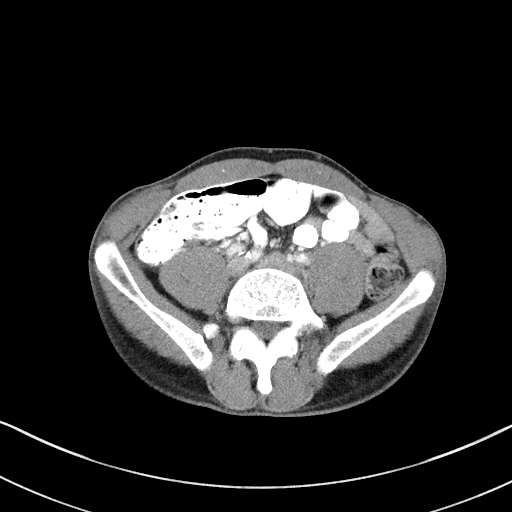
[im 48/131  mediastinal]
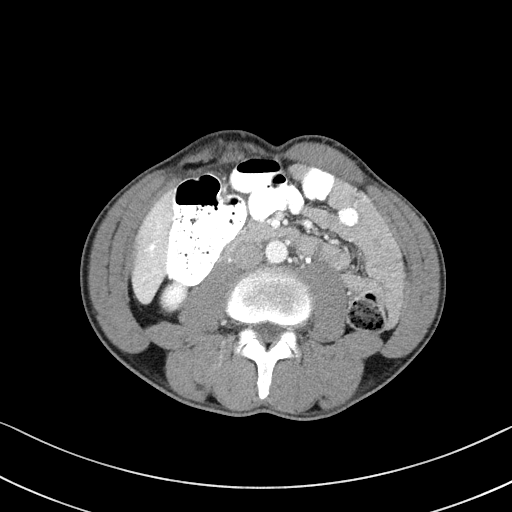
[im 60/131  mediastinal]
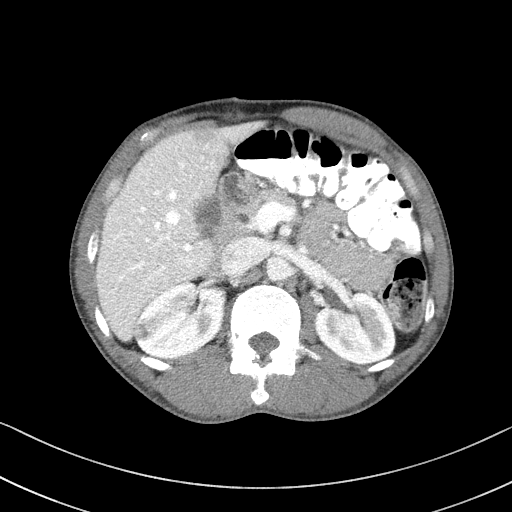
[im 71/131  mediastinal]
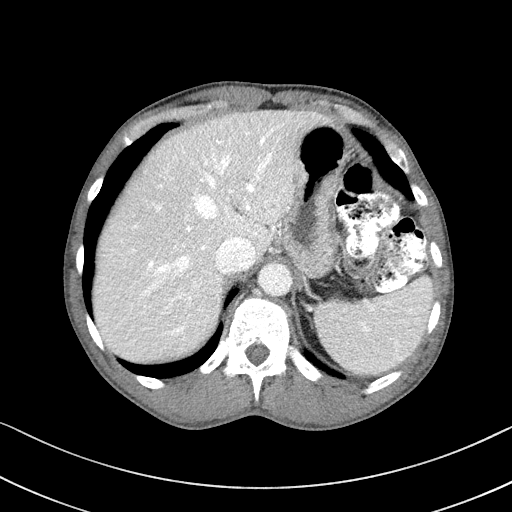
[im 83/131  mediastinal]
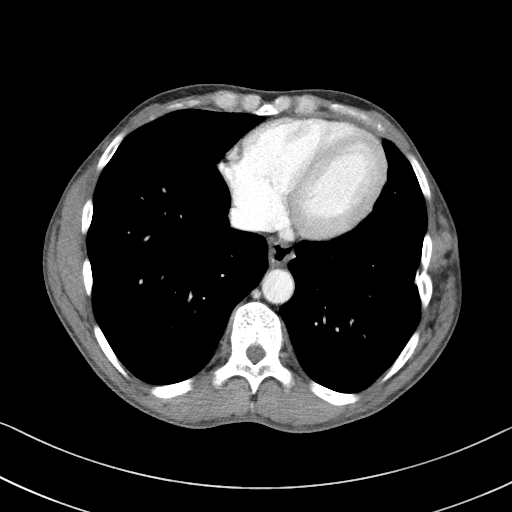
[im 95/131  mediastinal]
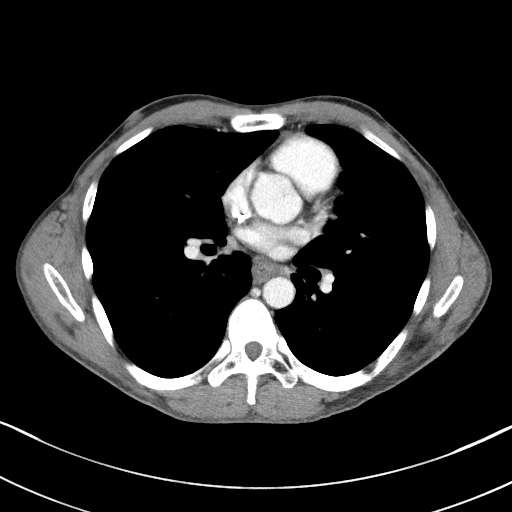
[im 107/131  mediastinal]
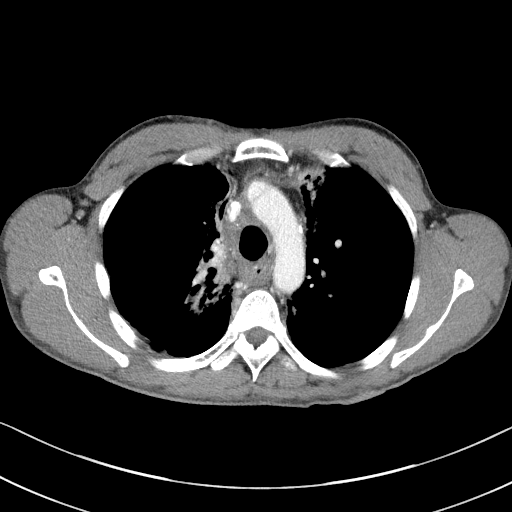
[im 107/131  bone]
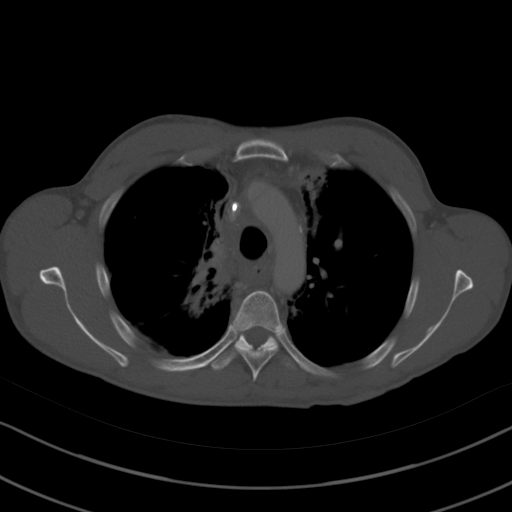
[im 119/131  mediastinal]
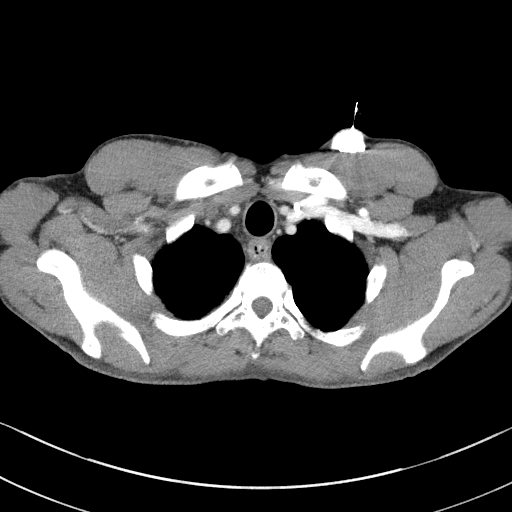

[Series 4: coronals · coronal · 0.83mm/px · 3 of 129 slices shown]
[im 26/129  mediastinal]
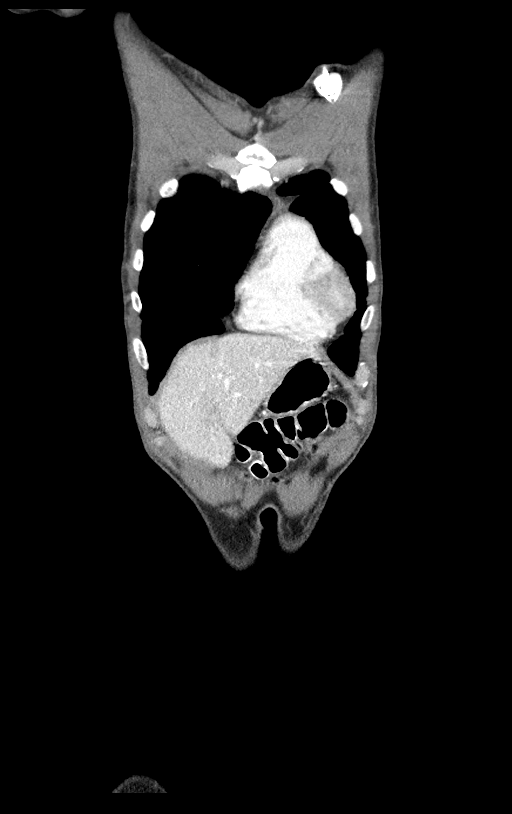
[im 52/129  mediastinal]
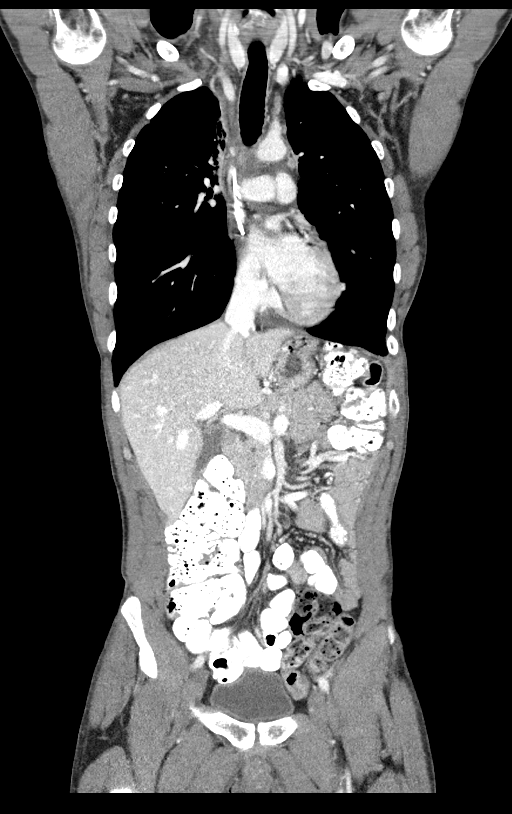
[im 77/129  mediastinal]
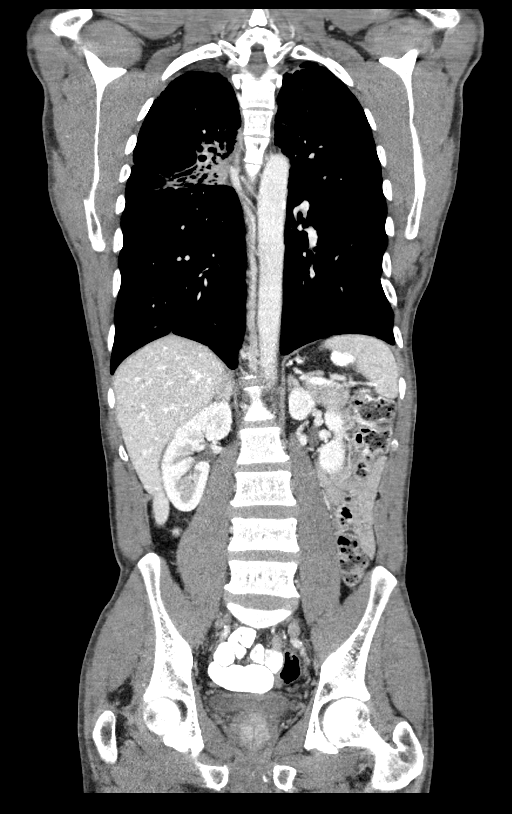

[13 of 36 positions shown; findings below may reference images not displayed]

FINDINGS: CT CHEST FINDINGS

Cardiovascular: The heart size is within normal limits. No
pericardial effusion. Aortic atherosclerosis.

Mediastinum/Nodes: Normal appearance of the thyroid gland. The
trachea appears patent and is midline. Normal appearance of the
esophagus. Similar appearance of diffuse soft tissue infiltration
with diminished mediastinal fat. Index right hilar nodal mass
measures 1.6 x 0.8 cm, image [DATE]. Previously 1.8 x 0.9 cm.
Enhancing subcarinal lymph node measures 0.8 cm, image 36/2.
Previously 0.9 cm. Right paratracheal lymph node measures 1.2 cm,
image [DATE]. Not significantly changed from previous exam.

Lungs/Pleura: No pleural effusion. Bilateral upper lobe and superior
segment of right lower lobe paramediastinal fibrosis is again noted
compatible with changes due to external beam radiation. The
appearance is unchanged from previous exam no suspicious nodule or
mass identified.

Musculoskeletal: No chest wall mass or suspicious bone lesions
identified.

CT ABDOMEN PELVIS FINDINGS

Hepatobiliary: No focal liver abnormality is seen. No gallstones,
gallbladder wall thickening, or biliary dilatation.

Pancreas: Unremarkable. No pancreatic ductal dilatation or
surrounding inflammatory changes.

Spleen: Normal in size without focal abnormality.

Adrenals/Urinary Tract: A new right adrenal nodule is suspected
measuring 1.6 cm, image 74/4. Normal left adrenal gland.
Heterogeneous and enhancing lesion arising from the lateral cortex
of the right mid kidney is again noted measuring 1.4 cm, image 91/4.
This was described previously this measured the same. Normal
appearance of the left kidney. Nonobstructing punctate stone within
inferior pole of right kidney is identified measuring 2 mm. Urinary
bladder is unremarkable.

Stomach/Bowel: Stomach is within normal limits. No evidence of bowel
wall thickening, distention, or inflammatory changes.

Vascular/Lymphatic: Mild aortic atherosclerosis. No aneurysm. No
abdominopelvic adenopathy.

Reproductive: Prostate is unremarkable.

Other: No free fluid or fluid collections.

Musculoskeletal: Unchanged sclerotic lesion in the left iliac bone
which likely represents a bone island. Similar appearance of
sclerotic lesion in the posterior right acetabulum, image 112/2.
Also likely bone island. No acute or suspicious osseous findings.
IMPRESSION: 1. Stable appearance of paramediastinal fibrosis within both lungs
compatible with changes due to external beam radiation.
2. New right adrenal nodule is suspected. Recommend more definitive
characterization with abdominal MRI.
3. Stable appearance of enhancing lesion arising from the lateral
cortex of the right mid kidney compatible. Incompletely
characterized. This can also be better addressed with MRI of the
abdomen without and with contrast material.
4. Aortic atherosclerosis.

Aortic Atherosclerosis ([ZQ]-[ZQ]).

## 2020-05-21 MED ORDER — HEPARIN SOD (PORK) LOCK FLUSH 100 UNIT/ML IV SOLN
INTRAVENOUS | Status: AC
  Filled 2020-05-21: qty 5

## 2020-05-21 MED ORDER — HEPARIN SOD (PORK) LOCK FLUSH 100 UNIT/ML IV SOLN
500.0000 [IU] | Freq: Once | INTRAVENOUS | Status: AC
  Administered 2020-05-21: 500 [IU] via INTRAVENOUS

## 2020-05-21 MED ORDER — IOHEXOL 300 MG/ML  SOLN
100.0000 mL | Freq: Once | INTRAMUSCULAR | Status: AC | PRN
  Administered 2020-05-21: 100 mL via INTRAVENOUS

## 2020-06-03 NOTE — Progress Notes (Signed)
HEMATOLOGY/ONCOLOGY CLINIC NOTE  Date of Service: 06/03/2020  Patient Care Team: [name], MD as PCP - General (Family Medicine)  CHIEF COMPLAINTS/PURPOSE OF CONSULTATION:  contnued management of Metastatic Poorly differentiated lung adenocarcinoma  HISTORY OF PRESENTING ILLNESS:   David Berry is a 55 year old male with no significant medical history.  He presented to the hospital with progressive right arm and chest pain with new right-sided neck swelling.  The patient states that he was diagnosed with carpal tunnel syndrome received an injection in August.  However, he continued to have progressive pain up and down his right arm that would also radiate to his right chest.  He took ibuprofen with no improvement.  1 week prior to admission, he developed new right sided neck swelling.  He presented to the emergency room for further evaluation.  Work-up in the emergency room was significant for a CT angiogram of the neck and chest which revealed a bulky soft tissue mass in the right neck continuing into the upper chest most compatible with extensive metastatic lymphadenopathy.  This mass measures up to 34 mm and encases multiple vessels including the right upper lobe pulmonary vessels, superior vena cava, and brachiocephalic vessel.  He also had a large poorly defined malignant appearing infiltrative mass measuring 6.2 x 5.6 x 7.9 cm within the right anterior and middle mediastinum with suspected invasion of the distal left brachiocephalic vein and probable tumor occlusion of the right subclavian and jugular veins.  There is also suspected tumor invasion of the upper SVC with string-like narrowing of the SVC but with patency of the SVC just above the right atrium.  The tumor abuts the aorta and great vessels and also results in significant narrowing of the right upper lobe pulmonary arterial vessels.  He also had a CT of the abdomen pelvis which showed a 15 mm heterogeneously enhancing  lesion in the upper pole of the right kidney concerning for renal cell carcinoma, no lymphadenopathy in the abdomen or pelvis, focal hyperenhancement of the anterior left liver most likely related to aberrant venous anatomy/flow, sclerotic lesions in the right acetabulum and left iliac bone are indeterminate.  These are likely bone islands but attention on follow-up is recommended a metastatic disease cannot be excluded.  MRI of the brain with and without contrast did not show any evidence of intracranial metastatic disease. The patient underwent a CT-guided biopsy in interventional radiology on 01/22/2019.  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   When seen today, patient reports ongoing pain and swelling to his right arm and right side of his neck.  Reports ongoing pain to his right chest.  He reports intermittent facial swelling which is worse in the morning and improves throughout the day.  He reports anorexia and a weight loss of 20 pounds over the past few weeks. He has had dizziness for the past few months.  Denies headaches.  He also noted that on the day of admission his peripheral vision was poor.  His vision is now improved.  He denies food getting stuck or difficulty swallowing but does feel as though his throat is "tight."  He reports a nonproductive cough.  Denies fevers and chills.  He is not really having any shortness of breath.  Denies abdominal pain, nausea, vomiting, constipation, diarrhea.  He has not noticed any epistaxis, hemoptysis, hematemesis, hematuria, melena, hematochezia.  The patient is single.  He has 1 daughter  who lives in [place], [place].  Reports occasional alcohol use and currently smokes 3 cigars a day since 1997.  He is currently living in a rooming house with other roommates who all have their own room.  They share a common bathroom and kitchen.  Medical oncology was asked see  the patient to make recommendations regarding his newly diagnosed poorly differentiated carcinoma.  History reviewed. No pertinent past medical history.   Current Treatment:  Atezolizumab maintenance   INTERVAL HISTORY:   David Berry. is a 55 y.o. male who is here for evaluation and management of metastatic poorly differentiated lung adenocarcinoma. He is here for C22 of Atezolizumab. The patient's last visit with [place] was on 04/23/2020. The pt reports that he is doing well overall.  The pt reports that he recently had a respirator test at his job, which was performed due to his recent shortness of breath. 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   The pt notes that he gets lightheaded when he stands up abruptly. He notes he has lost 4 pounds and this is probably due to his decrease appetite and busier work schedule.   The pt notes no problems with his ports. He still has chronic headaches that happen intermittently throughout the week. This lasts a few seconds and goes away on his own. He believes it is stress-related.   Of note since the patient's last visit, pt has had CT Chest/Abd/Pel w contrast ([id number]) on 05/21/2020, which revealed "1. Stable appearance of paramediastinal fibrosis within both lungs compatible with changes due to external beam radiation. 2. New right adrenal nodule is suspected. Recommend more definitive characterization with abdominal MRI. 3. Stable appearance of enhancing lesion arising from the lateral cortex of the right mid kidney compatible. Incompletely characterized. This can also be better addressed with MRI of the abdomen without and with contrast material. 4. Aortic atherosclerosis."  Lab results today 06/04/2020 of CBC w/diff and CMP is as follows: all values are WNL except for WBC at 3.8K, Hgb of 11.3, HCT of 37.4, MCH of 25.0.  Chemistries wnl  On review of  systems, pt reports SOB, lightheadedness and denies abdominal pain, back pain, flank pain, changes in urination or bowel habits, leg swelling and any other symptoms.   MEDICAL HISTORY:  Past Medical History:  Diagnosis Date  . Anxiety   . Bipolar disorder ([place])   . Blood in stool   . Cancer ([place])   . 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 GERD (gastroesophageal reflux disease)   . Lung cancer ([place]) dx'd 01/2019  . Peripheral vascular disease ([place])    blood clot in neck Sept 12, 2020    SURGICAL HISTORY: Past Surgical History:  Procedure Laterality Date  . APPENDECTOMY     teenager  . INGUINAL HERNIA REPAIR Right 2016, 2017   [facility], 2018 [facility] mesh  . IR IMAGING GUIDED PORT INSERTION  04/26/2019  . TOOTH EXTRACTION N/A 03/14/2020   Procedure: DENTAL RESTORATION/EXTRACTIONS;  Surgeon: Diona Browner, DDS;  Location: [facility];  Service: Oral Surgery;  Laterality: N/A;    SOCIAL HISTORY: Social History   Socioeconomic History  . Marital status: Single    Spouse name: Not on file  . Number of children: 1  . Years of education: GED  . Highest education level: Not on file  Occupational History    Comment: fork lift driver  Tobacco Use  . Smoking status: Former Smoker    Types: Cigars    Quit date: 01/20/2019    Years since quitting: 1.3  . Smokeless tobacco: Never Used  . Tobacco comment: smokes 3 black and mild cigars per day  Substance and Sexual Activity  . Alcohol use: Not Currently  . Drug use: No  . Sexual activity: Yes  Other Topics Concern  . Not on file  Social History Narrative   Lives alone   Caffeine- coffee, 20 oz daily, tea occas   Social Determinants of Health   Financial Resource Strain: Not on file  Food Insecurity: Not on file  Transportation Needs: Not on file  Physical Activity: Not on file  Stress: Not on file  Social Connections: Not on file  Intimate Partner Violence: Not on file    FAMILY  HISTORY: Family History  Problem Relation Age of Onset  . Prostate cancer Father     ALLERGIES:  is allergic to pregabalin.  MEDICATIONS:  Current Outpatient Medications  Medication Sig Dispense Refill  . amoxicillin (AMOXIL) 500 MG capsule Take 1 capsule (500 mg total) by mouth 3 (three) times daily. (Patient not taking: Reported on 04/02/2020) 21 capsule 0  . ELIQUIS 5 MG TABS tablet TAKE 1 TABLET BY MOUTH TWICE DAILY (Patient taking differently: Take 5 mg by mouth 2 (two) times daily. ) 60 tablet 11  . esomeprazole (NEXIUM) 40 MG capsule TAKE 1 CAPSULE(40 MG) BY MOUTH DAILY 30 capsule 1  . ibuprofen (ADVIL) 800 MG tablet Take 800 mg by mouth every 6 (six) hours as needed for headache or moderate pain.     [name] LORazepam (ATIVAN) 0.5 MG tablet Take 1 tablet (0.5 mg total) by mouth every 4 (four) hours as needed for anxiety. (Patient taking differently: Take 0.5 mg by mouth every 4 (four) hours as needed (Nausea). ) 60 tablet 0  . oxyCODONE-acetaminophen (PERCOCET) 5-325 MG tablet Take 1 tablet by mouth every 4 (four) hours as needed. (Patient not taking: Reported on 04/02/2020) 30 tablet 0   No current facility-administered medications for this visit.    REVIEW OF SYSTEMS:   10 Point review of Systems was done is negative except as noted above.  PHYSICAL EXAMINATION: ECOG FS:1 - Symptomatic but completely ambulatory  Vitals:   06/04/20 0910  BP: 103/77  Pulse: 73  Resp: 20  Temp: 98.1 F (36.7 C)  SpO2: 100%   Wt Readings from Last 3 Encounters:  05/14/20 135 lb 8 oz (61.5 kg)  04/23/20 135 lb 6.4 oz (61.4 kg)  04/02/20 132 lb 8 oz (60.1 kg)   Body mass index is 18.35 kg/m.     GENERAL:alert, in no acute distress and comfortable SKIN: no acute rashes, no significant lesions EYES: conjunctiva are pink and non-injected, sclera anicteric OROPHARYNX: MMM, no exudates, no oropharyngeal erythema or ulceration NECK: supple, no JVD LYMPH:  no palpable lymphadenopathy in the  cervical, axillary or inguinal regions LUNGS: clear to auscultation b/l with normal respiratory effort HEART: regular rate & rhythm ABDOMEN:  normoactive bowel sounds , non tender, not distended. Extremity: no pedal edema PSYCH: alert & oriented x 3 with fluent speech NEURO: no focal motor/sensory deficits    LABORATORY DATA:  I have reviewed the data as listed  . CBC Latest Ref Rng & Units 06/04/2020 05/14/2020 04/23/2020  WBC 4.0 - 10.5 K/uL 3.8(L) 4.0 4.0  Hemoglobin 13.0 - 17.0 g/dL 11.3(L) 11.1(L)  10.7(L)  Hematocrit 39.0 - 52.0 % 37.4(L) 35.6(L) 34.2(L)  Platelets 150 - 400 K/uL 275 343 272    . CMP Latest Ref Rng & Units 06/04/2020 05/14/2020 04/23/2020  Glucose 70 - 99 mg/dL 91 95 95  BUN 6 - 20 mg/dL '10 9 10  ' Creatinine 0.61 - 1.24 mg/dL 0.92 0.94 0.90  Sodium 135 - 145 mmol/L 141 140 141  Potassium 3.5 - 5.1 mmol/L 4.1 4.2 4.2  Chloride 98 - 111 mmol/L 107 107 108  CO2 22 - 32 mmol/L '27 25 30  ' Calcium 8.9 - 10.3 mg/dL 9.4 9.2 9.5  Total Protein 6.5 - 8.1 g/dL 7.5 7.0 7.4  Total Bilirubin 0.3 - 1.2 mg/dL 0.3 0.4 0.4  Alkaline Phos 38 - 126 U/L 111 109 120  AST 15 - 41 U/L 15 13(L) 14(L)  ALT 0 - 44 U/L '9 9 10    ' 01/22/2019 [facility]: Tumor Mutational Burden     01/22/2019 PD-L1 Immunohistochemistry Analysis    01/22/2019 Soft Tissue Needle Core Biopsy Surgical Pathology     RADIOGRAPHIC STUDIES: I have personally reviewed the radiological images as listed and agreed with the findings in the report. CT CHEST ABDOMEN PELVIS W CONTRAST  Result Date: 05/21/2020 CLINICAL DATA:  Restaging non-small cell lung cancer. EXAM: CT CHEST, ABDOMEN, AND PELVIS WITH CONTRAST TECHNIQUE: Multidetector CT imaging of the chest, abdomen and pelvis was performed following the standard protocol during bolus administration of intravenous contrast. CONTRAST:  165m OMNIPAQUE IOHEXOL 300 MG/ML  SOLN COMPARISON:  Bone CT chest 10/10/2019 and CT AP 01/21/2019 FINDINGS: CT CHEST  FINDINGS Cardiovascular: The heart size is within normal limits. No pericardial effusion. Aortic atherosclerosis. Mediastinum/Nodes: Normal appearance of the thyroid gland. The trachea appears patent and is midline. Normal appearance of the esophagus. Similar appearance of diffuse soft tissue infiltration with diminished mediastinal fat. Index right hilar nodal mass measures 1.6 x 0.8 cm, image 31/2. Previously 1.8 x 0.9 cm. Enhancing subcarinal lymph node measures 0.8 cm, image 36/2. Previously 0.9 cm. Right paratracheal lymph node measures 1.2 cm, image 27/2. Not significantly changed from previous exam. Lungs/Pleura: No pleural effusion. Bilateral upper lobe and superior segment of right lower lobe paramediastinal fibrosis is again noted compatible with changes due to external beam radiation. The appearance is unchanged from previous exam no suspicious nodule or mass identified. Musculoskeletal: No chest wall mass or suspicious bone lesions identified. CT ABDOMEN PELVIS FINDINGS Hepatobiliary: No focal liver abnormality is seen. No gallstones, gallbladder wall thickening, or biliary dilatation. Pancreas: Unremarkable. No pancreatic ductal dilatation or surrounding inflammatory changes. Spleen: Normal in size without focal abnormality. Adrenals/Urinary Tract: A new right adrenal nodule is suspected measuring 1.6 cm, image 74/4. Normal left adrenal gland. Heterogeneous and enhancing lesion arising from the lateral cortex of the right mid kidney is again noted measuring 1.4 cm, image 91/4. This was described previously this measured the same. Normal appearance of the left kidney. Nonobstructing punctate stone within inferior pole of right kidney is identified measuring 2 mm. Urinary bladder is unremarkable. Stomach/Bowel: Stomach is within normal limits. No evidence of bowel wall thickening, distention, or inflammatory changes. Vascular/Lymphatic: Mild aortic atherosclerosis. No aneurysm. No abdominopelvic  adenopathy. Reproductive: Prostate is unremarkable. Other: No free fluid or fluid collections. Musculoskeletal: Unchanged sclerotic lesion in the left iliac bone which likely represents a bone island. Similar appearance of sclerotic lesion in the posterior right acetabulum, image 112/2. Also likely bone island. No acute or suspicious osseous findings. IMPRESSION: 1. Stable appearance of paramediastinal  fibrosis within both lungs compatible with changes due to external beam radiation. 2. New right adrenal nodule is suspected. Recommend more definitive characterization with abdominal MRI. 3. Stable appearance of enhancing lesion arising from the lateral cortex of the right mid kidney compatible. Incompletely characterized. This can also be better addressed with MRI of the abdomen without and with contrast material. 4. Aortic atherosclerosis. Aortic Atherosclerosis (ICD10-I70.0). Electronically Signed   By: Kerby Moors M.D.   On: 05/21/2020 10:53    ASSESSMENT & PLAN:   This is a 55 year old malewith  1. Metastatic Poorly differentiated lung adenocarcinoma Presented with Large neck mass, mediastinal mass, renal mass, and questionable bone lesions no brain mets on MRI brain 01/22/2019 PD-L1 Immunohistochemistry Analysis which revealed "Tumor Proportion Score (TPS) 50%" 05/16/2019 PET/CT ([id number]) revealed "Radiation changes in the right hemithorax, as above. Improving mediastinal nodal metastases. Prior bulky right cervical metastases have resolved. No evidence of metastatic disease in the abdomen/pelvis." 07/19/2019 Esophagus Scan ([id number]) revealed "Mild stricture at the C6-7 level likely related to prior esophageal surgery. Barium tablet was slow to pass through this area but did pass through after 1 minutes. Hiatal hernia with moderate gastroesophageal reflux. Moderate stricture above the hiatal hernia likely due to reflux. Barium tablet did not pass this area. There are changes of  esophagitis which are likely due to reflux and possibly radiation as well."  10/10/19 of  CT Chest W Contrast ([id number])- no evidence of lung cancer progression at this time.   2. 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 Symptomatic hemorrhoids  5. Normocytic anemia -Likely due to underlying malignancy  6.Thrombocytosis -- likely due to paraneoplastic effect of tumor and reactive due to inflammation and tissue inflammation from RT.-- now resolved.  7. Nicotine dependence -He was counseled about tobacco cessation. -has quit since cancer diagnosis.  PLAN:  -Discussed pt labwork today, counts are stable and chemistries pending. -Discussed pt CT Chest/Abd/Pel w contrast ([id number]) on 05/21/2020; no signs of new lung cancer in lungs or chest. But there is concern new rt adrenal nodule. -Discussed pt's requirement for wearing respirator mask at work; advised pt to f/u with PCP. Dr. Irene Limbo will provide a general letter of recommendation on pt's behalf. -Advised pt that there is no change in lung cancer based off CT scan and if there are no new breathing symptoms, he is okay to wear N95 at work. -Recommend MRI of kidney area due to indeterminate findings on a nodule observed from CT scan.  -Refill Eliquis.  -The pt has no prohibitive toxicities from continuing C22 of Atezolizumab at this time. -Will get MRI in 1 week. -Will see back in 3 weeks with labs.    FOLLOW UP: MRI abdomen with and without contrast in 1 week Please schedule next 4 cycles of atezolizumab with labs MD visit with next cycle of treatment in 3 weeks   The total time spent in the appointment was 20 minutes and more than 50% was on counseling and direct patient cares.  All of the patient's questions were answered with apparent satisfaction. The patient knows to call the  clinic with any problems, questions or concerns.   David Lone MD Bee AAHIVMS [facility] Hematology/Oncology Physician [facility]  (Office):       [phone] (Work cell):  [phone] (Fax):  [phone]  06/03/2020 1:08 PM  I, Reinaldo Raddle, am acting as scribe for Dr. Sullivan Lone, MD.   .I have reviewed the above documentation for accuracy and completeness, and I agree with the above. Brunetta Genera MD

## 2020-06-04 ENCOUNTER — Inpatient Hospital Stay (HOSPITAL_BASED_OUTPATIENT_CLINIC_OR_DEPARTMENT_OTHER): Payer: Medicaid Other | Admitting: Hematology

## 2020-06-04 ENCOUNTER — Inpatient Hospital Stay: Payer: Medicaid Other

## 2020-06-04 ENCOUNTER — Encounter: Payer: Self-pay | Admitting: *Deleted

## 2020-06-04 ENCOUNTER — Other Ambulatory Visit: Payer: Self-pay

## 2020-06-04 VITALS — BP 103/77 | HR 73 | Temp 98.1°F | Resp 20 | Ht 71.0 in | Wt 131.6 lb

## 2020-06-04 DIAGNOSIS — E278 Other specified disorders of adrenal gland: Secondary | ICD-10-CM

## 2020-06-04 DIAGNOSIS — C3491 Malignant neoplasm of unspecified part of right bronchus or lung: Secondary | ICD-10-CM

## 2020-06-04 DIAGNOSIS — Z5112 Encounter for antineoplastic immunotherapy: Secondary | ICD-10-CM

## 2020-06-04 DIAGNOSIS — Z7189 Other specified counseling: Secondary | ICD-10-CM

## 2020-06-04 DIAGNOSIS — Z5111 Encounter for antineoplastic chemotherapy: Secondary | ICD-10-CM

## 2020-06-04 LAB — CBC WITH DIFFERENTIAL/PLATELET
Abs Immature Granulocytes: 0.01 10*3/uL (ref 0.00–0.07)
Basophils Absolute: 0.1 10*3/uL (ref 0.0–0.1)
Basophils Relative: 1 %
Eosinophils Absolute: 0.1 10*3/uL (ref 0.0–0.5)
Eosinophils Relative: 1 %
HCT: 37.4 % — ABNORMAL LOW (ref 39.0–52.0)
Hemoglobin: 11.3 g/dL — ABNORMAL LOW (ref 13.0–17.0)
Immature Granulocytes: 0 %
Lymphocytes Relative: 23 %
Lymphs Abs: 0.9 10*3/uL (ref 0.7–4.0)
MCH: 25 pg — ABNORMAL LOW (ref 26.0–34.0)
MCHC: 30.2 g/dL (ref 30.0–36.0)
MCV: 82.7 fL (ref 80.0–100.0)
Monocytes Absolute: 0.5 10*3/uL (ref 0.1–1.0)
Monocytes Relative: 13 %
Neutro Abs: 2.4 10*3/uL (ref 1.7–7.7)
Neutrophils Relative %: 62 %
Platelets: 275 10*3/uL (ref 150–400)
RBC: 4.52 MIL/uL (ref 4.22–5.81)
RDW: 14.3 % (ref 11.5–15.5)
WBC: 3.8 10*3/uL — ABNORMAL LOW (ref 4.0–10.5)
nRBC: 0 % (ref 0.0–0.2)

## 2020-06-04 LAB — CMP (CANCER CENTER ONLY)
ALT: 9 U/L (ref 0–44)
AST: 15 U/L (ref 15–41)
Albumin: 4 g/dL (ref 3.5–5.0)
Alkaline Phosphatase: 111 U/L (ref 38–126)
Anion gap: 7 (ref 5–15)
BUN: 10 mg/dL (ref 6–20)
CO2: 27 mmol/L (ref 22–32)
Calcium: 9.4 mg/dL (ref 8.9–10.3)
Chloride: 107 mmol/L (ref 98–111)
Creatinine: 0.92 mg/dL (ref 0.61–1.24)
GFR, Estimated: >60 mL/min (ref >60)
Glucose, Bld: 91 mg/dL (ref 70–99)
Potassium: 4.1 mmol/L (ref 3.5–5.1)
Sodium: 141 mmol/L (ref 135–145)
Total Bilirubin: 0.3 mg/dL (ref 0.3–1.2)
Total Protein: 7.5 g/dL (ref 6.5–8.1)

## 2020-06-04 MED ORDER — ACETAMINOPHEN 325 MG PO TABS
ORAL_TABLET | ORAL | Status: AC
  Filled 2020-06-04: qty 2

## 2020-06-04 MED ORDER — DIPHENHYDRAMINE HCL 25 MG PO CAPS
ORAL_CAPSULE | ORAL | Status: AC
  Filled 2020-06-04: qty 2

## 2020-06-04 MED ORDER — FAMOTIDINE 20 MG PO TABS
ORAL_TABLET | ORAL | Status: AC
  Filled 2020-06-04: qty 1

## 2020-06-04 MED ORDER — SODIUM CHLORIDE 0.9 % IV SOLN
1200.0000 mg | Freq: Once | INTRAVENOUS | Status: AC
  Administered 2020-06-04: 1200 mg via INTRAVENOUS
  Filled 2020-06-04: qty 20

## 2020-06-04 MED ORDER — SODIUM CHLORIDE 0.9 % IV SOLN
Freq: Once | INTRAVENOUS | Status: AC
  Filled 2020-06-04: qty 250

## 2020-06-04 MED ORDER — ACETAMINOPHEN 325 MG PO TABS
650.0000 mg | ORAL_TABLET | Freq: Once | ORAL | Status: AC
  Administered 2020-06-04: 650 mg via ORAL

## 2020-06-04 MED ORDER — FAMOTIDINE 20 MG PO TABS
20.0000 mg | ORAL_TABLET | Freq: Once | ORAL | Status: AC
  Administered 2020-06-04: 20 mg via ORAL

## 2020-06-04 MED ORDER — DIPHENHYDRAMINE HCL 25 MG PO TABS
25.0000 mg | ORAL_TABLET | Freq: Once | ORAL | Status: AC
  Administered 2020-06-04: 25 mg via ORAL
  Filled 2020-06-04: qty 1

## 2020-06-04 MED ORDER — SODIUM CHLORIDE 0.9% FLUSH
10.0000 mL | INTRAVENOUS | Status: DC | PRN
  Administered 2020-06-04: 10 mL
  Filled 2020-06-04: qty 10

## 2020-06-04 MED ORDER — APIXABAN 5 MG PO TABS: 5.0000 mg | ORAL_TABLET | Freq: Two times a day (BID) | ORAL | 11 refills | Status: DC

## 2020-06-04 MED ORDER — HEPARIN SOD (PORK) LOCK FLUSH 100 UNIT/ML IV SOLN
500.0000 [IU] | Freq: Once | INTRAVENOUS | Status: AC | PRN
  Administered 2020-06-04: 500 [IU]
  Filled 2020-06-04: qty 5

## 2020-06-04 MED ORDER — DIPHENHYDRAMINE HCL 25 MG PO CAPS
ORAL_CAPSULE | ORAL | Status: AC
  Filled 2020-06-04: qty 1

## 2020-06-04 NOTE — Patient Instructions (Signed)
[facility] Discharge Instructions for Patients Receiving Chemotherapy  Today you received the following chemotherapy agents: Atezolizumab (Tecentriq)  To help prevent nausea and vomiting after your treatment, we encourage you to take your nausea medication as prescribed.    If you develop nausea and vomiting that is not controlled by your nausea medication, call the clinic.   BELOW ARE SYMPTOMS THAT SHOULD BE REPORTED IMMEDIATELY:  *FEVER GREATER THAN 100.5 F  *CHILLS WITH OR WITHOUT FEVER  NAUSEA AND VOMITING THAT IS NOT CONTROLLED WITH YOUR NAUSEA MEDICATION  *UNUSUAL SHORTNESS OF BREATH  *UNUSUAL BRUISING OR BLEEDING  TENDERNESS IN MOUTH AND THROAT WITH OR WITHOUT PRESENCE OF ULCERS  *URINARY PROBLEMS  *BOWEL PROBLEMS  UNUSUAL RASH Items with * indicate a potential emergency and should be followed up as soon as possible.  Feel free to call the clinic should you have any questions or concerns. The clinic phone number is [phone].  Please show the CHEMO ALERT CARD at check-in to the Emergency Department and triage nurse.   

## 2020-06-18 ENCOUNTER — Ambulatory Visit (HOSPITAL_COMMUNITY)
Admission: RE | Admit: 2020-06-18 | Discharge: 2020-06-18 | Disposition: A | Discharge status: Home / Self Care | Payer: Medicaid Other | Source: Ambulatory Visit | Attending: Hematology | Admitting: Hematology

## 2020-06-18 ENCOUNTER — Other Ambulatory Visit: Payer: Self-pay

## 2020-06-18 DIAGNOSIS — C3491 Malignant neoplasm of unspecified part of right bronchus or lung: Secondary | ICD-10-CM | POA: Insufficient documentation

## 2020-06-18 DIAGNOSIS — E278 Other specified disorders of adrenal gland: Secondary | ICD-10-CM | POA: Insufficient documentation

## 2020-06-18 DIAGNOSIS — K769 Liver disease, unspecified: Secondary | ICD-10-CM | POA: Diagnosis not present

## 2020-06-18 DIAGNOSIS — C3490 Malignant neoplasm of unspecified part of unspecified bronchus or lung: Secondary | ICD-10-CM | POA: Diagnosis not present

## 2020-06-18 IMAGING — MR MR ABDOMEN WO/W CM
25 series · 48 of 48 positions shown · IV contrast (gadavist)
Comparison: CT chest abdomen pelvis, [DATE], CT abdomen pelvis,
[DATE]

CLINICAL DATA: Indeterminate adrenal nodule in the setting of lung
cancer, characterize

EXAM:
MRI ABDOMEN WITHOUT AND WITH CONTRAST
TECHNIQUE: Multiplanar multisequence MR imaging of the abdomen was performed
both before and after the administration of intravenous contrast.
CONTRAST:  6mL GADAVIST GADOBUTROL 1 MMOL/ML IV SOLN

[Series 4: T2 fat-sat · axial · 6.0mm · 0.86mm/px · z∈[-171,+66]mm · 2 of 34 slices shown]
[im 1/34]
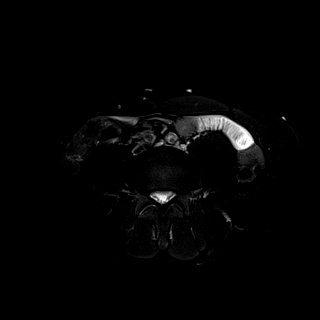
[im 34/34]
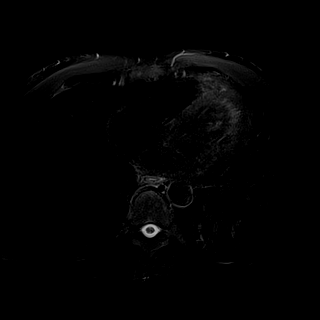

[Series 5: T2 · coronal · 6.0mm · 1.07mm/px · 2 of 30 slices shown (1 of 2)]
[im 1/30]
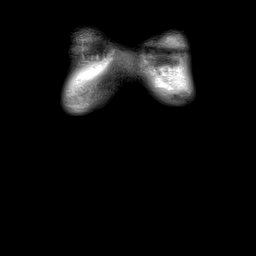
[im 30/30]
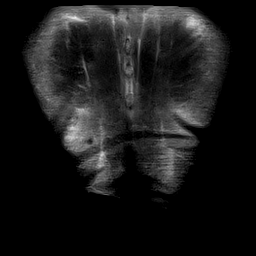

[Series 6: T1 · axial · 3.0mm · 0.86mm/px · z∈[-198,+63]mm · 2 of 88 slices shown (1 of 4)]
[im 1/88]
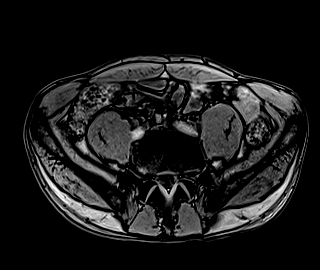
[im 88/88]
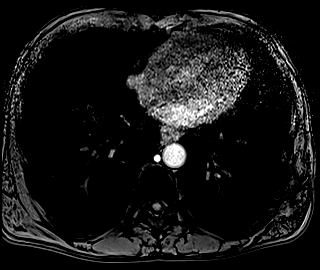

[Series 7: T1 · axial · 3.0mm · 0.86mm/px · z∈[-198,+63]mm · 2 of 88 slices shown (2 of 4)]
[im 1/88]
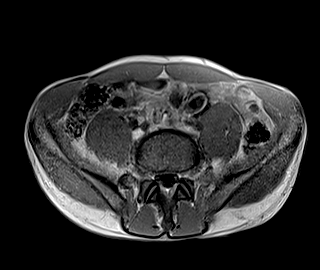
[im 88/88]
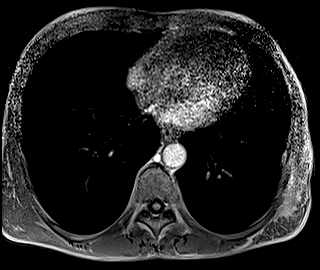

[Series 8: T1 · coronal · 3.0mm · 0.86mm/px · 2 of 72 slices shown (3 of 4)]
[im 1/72]
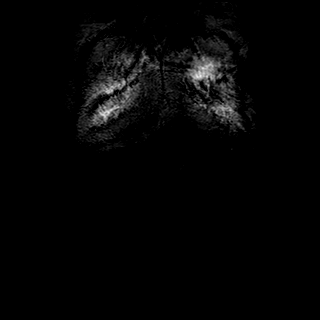
[im 72/72]
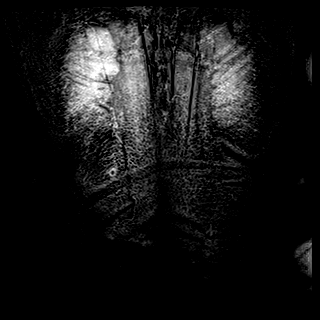

[Series 9: T1 · coronal · 3.0mm · 0.86mm/px · 2 of 72 slices shown (4 of 4)]
[im 1/72]
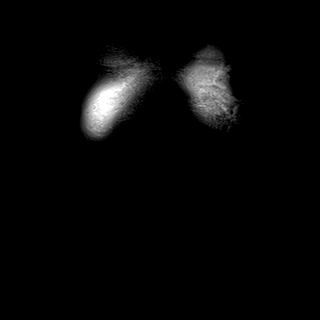
[im 72/72]
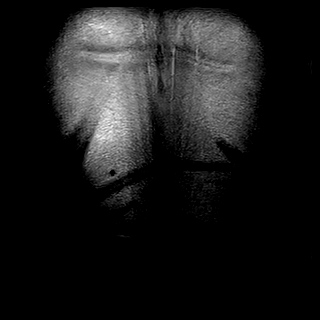

[Series 10: DWI · axial · 6.0mm · 1.36mm/px · z∈[-183,+69]mm · 2 of 72 slices shown (1 of 2)]
[im 1/72]
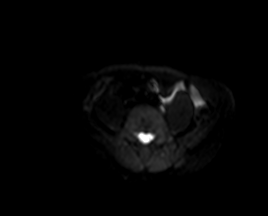
[im 72/72]
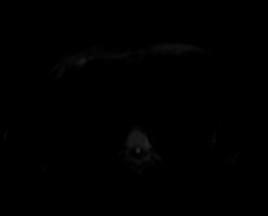

[Series 11: DWI · axial · 6.0mm · 1.36mm/px · 1 of 36 slices shown (2 of 2)]
[im 1/36]
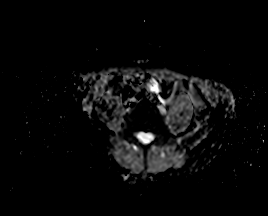

[Series 12: T1 dynamic · axial · 3.0mm · 0.86mm/px · z∈[-201,+60]mm · 2 of 88 slices shown (1 of 16)]
[im 1/88]
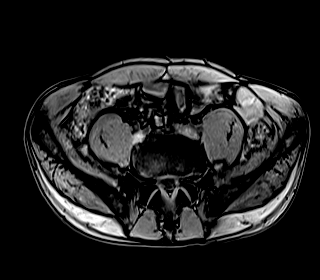
[im 88/88]
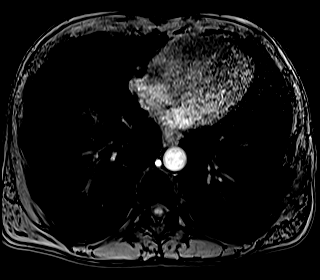

[Series 13: T1 dynamic · axial · 3.0mm · 0.86mm/px · z∈[-201,+60]mm · 2 of 88 slices shown (2 of 16)]
[im 1/88]
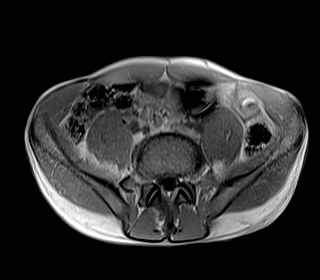
[im 88/88]
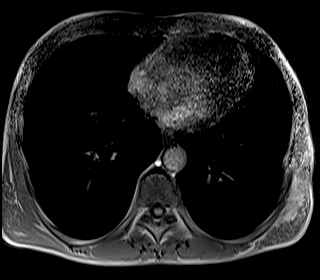

[Series 15: T1 dynamic · axial · 3.0mm · 0.86mm/px · z∈[-201,+60]mm · 2 of 88 slices shown (3 of 16)]
[im 1/88]
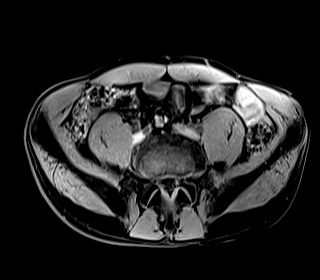
[im 88/88]
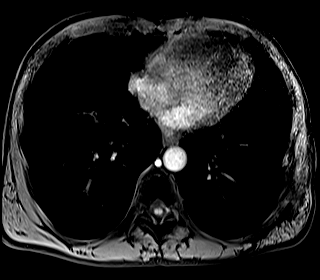

[Series 17: T1 dynamic · axial · 3.0mm · 0.86mm/px · z∈[-201,+60]mm · 2 of 88 slices shown (4 of 16)]
[im 1/88]
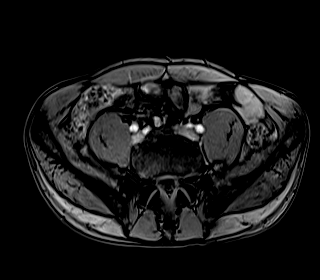
[im 88/88]
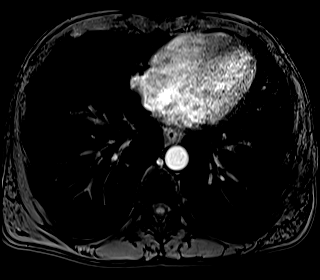

[Series 18: T1 dynamic · axial · 3.0mm · 0.86mm/px · z∈[-201,+60]mm · 2 of 88 slices shown (5 of 16)]
[im 1/88]
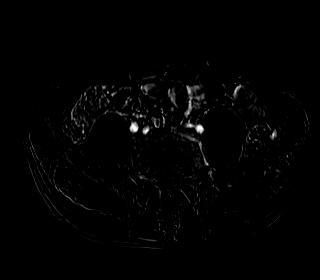
[im 88/88]
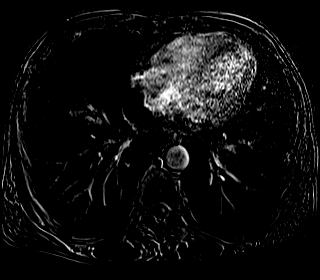

[Series 19: T1 dynamic · axial · 3.0mm · 0.86mm/px · z∈[-201,+60]mm · 2 of 88 slices shown (6 of 16)]
[im 1/88]
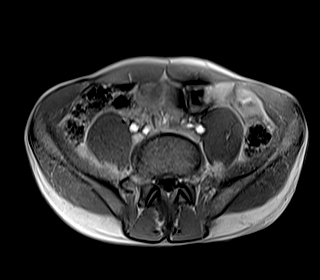
[im 88/88]
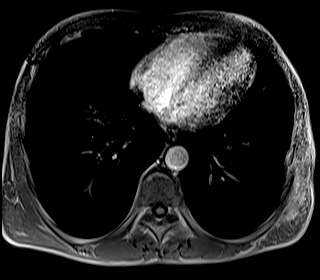

[Series 20: T1 dynamic · axial · 3.0mm · 0.86mm/px · z∈[-201,+60]mm · 2 of 88 slices shown (7 of 16)]
[im 1/88]
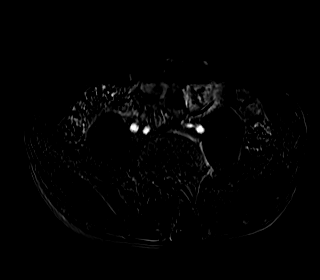
[im 88/88]
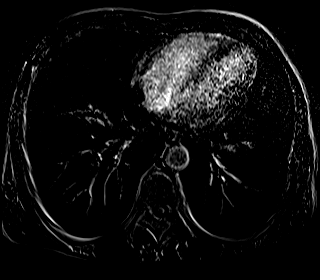

[Series 23: T1 dynamic · axial · 3.0mm · 0.86mm/px · z∈[-201,+60]mm · 2 of 88 slices shown (8 of 16)]
[im 1/88]
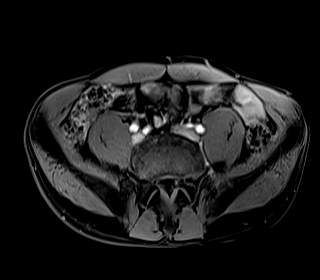
[im 88/88]
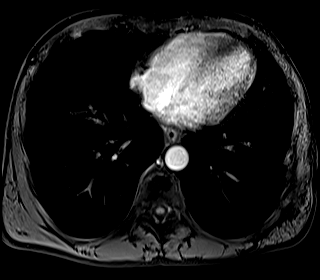

[Series 24: T1 dynamic · axial · 3.0mm · 0.86mm/px · z∈[-201,+60]mm · 2 of 88 slices shown (9 of 16)]
[im 1/88]
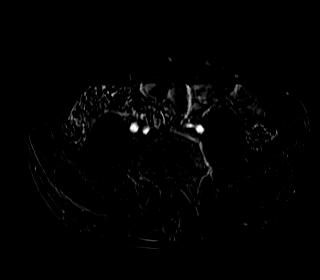
[im 88/88]
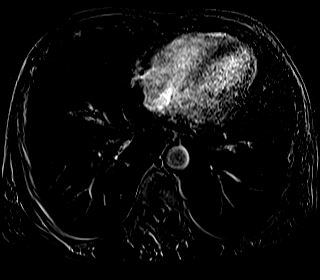

[Series 31: T1 dynamic · axial · 3.0mm · 0.86mm/px · z∈[-201,+60]mm · 2 of 88 slices shown (10 of 16)]
[im 1/88]
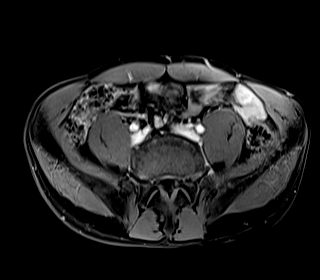
[im 88/88]
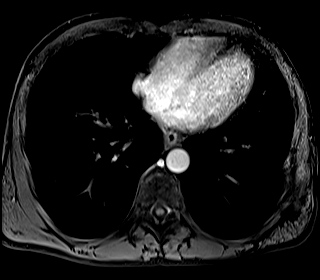

[Series 32: T1 dynamic · axial · 3.0mm · 0.86mm/px · z∈[-201,+60]mm · 2 of 88 slices shown (11 of 16)]
[im 1/88]
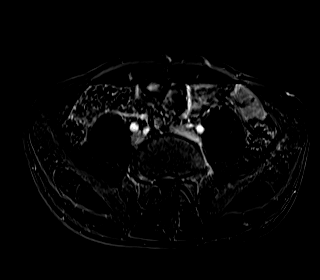
[im 88/88]
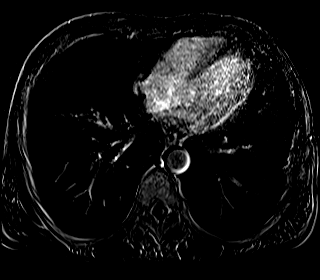

[Series 39: T1 dynamic · axial · 3.0mm · 0.86mm/px · z∈[-201,+60]mm · 2 of 88 slices shown (12 of 16)]
[im 1/88]
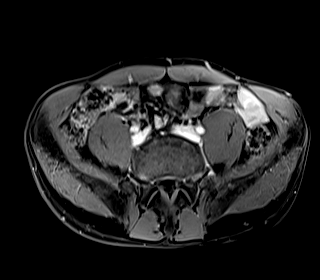
[im 88/88]
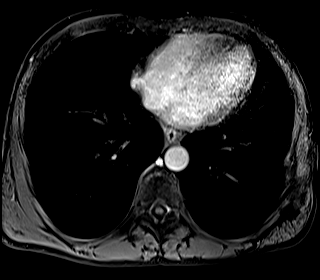

[Series 40: T1 dynamic · axial · 3.0mm · 0.86mm/px · z∈[-201,+60]mm · 2 of 88 slices shown (13 of 16)]
[im 1/88]
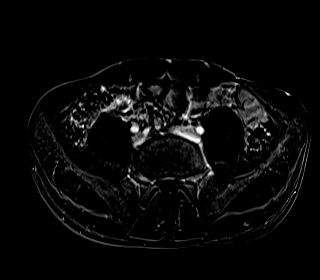
[im 88/88]
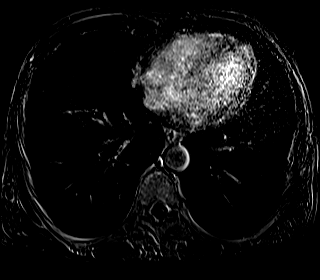

[Series 42: T1 dynamic · coronal · 3.0mm · 0.86mm/px · 2 of 72 slices shown (14 of 16)]
[im 1/72]
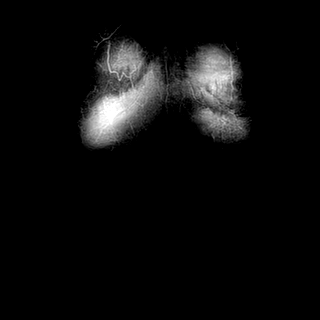
[im 72/72]
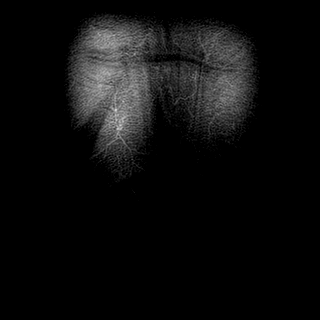

[Series 43: T2 · axial · 6.0mm · 1.07mm/px · 1 of 36 slices shown (2 of 2)]
[im 1/36]
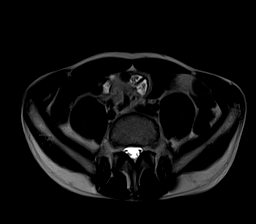

[Series 50: T1 dynamic · axial · 3.0mm · 0.86mm/px · z∈[-201,+60]mm · 2 of 88 slices shown (15 of 16)]
[im 1/88]
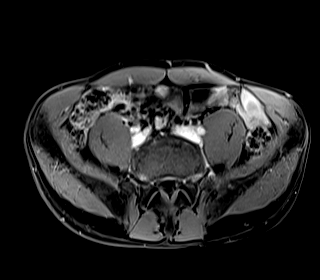
[im 88/88]
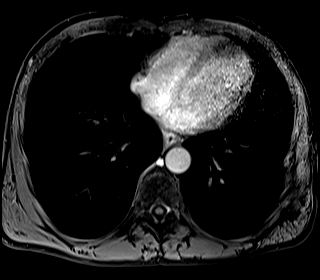

[Series 51: T1 dynamic · axial · 3.0mm · 0.86mm/px · z∈[-201,+60]mm · 2 of 88 slices shown (16 of 16)]
[im 1/88]
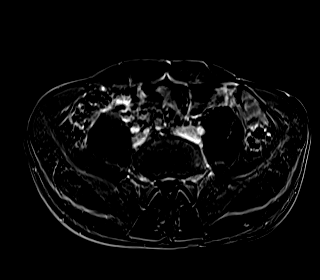
[im 88/88]
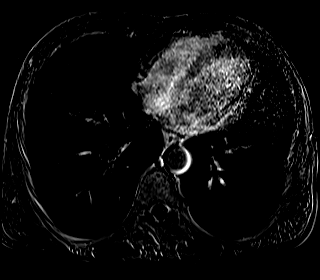

[48 of 48 positions shown; findings below may reference images not displayed]

FINDINGS: Lower chest: No acute findings.

Hepatobiliary: There is a rounded, arterially hyperenhancing lesion
of the anterior right lobe of the liver, hepatic segment VII,
measuring 0.9 x 0.8 cm, which fades to background parenchymal
enhancement on multiphasic imaging. This demonstrates very subtle
underlying intrinsic T2 hyperintensity and macroscopic fatty dropout
on in and opposed phase imaging. No other parenchymal abnormality
identified.

Pancreas: No mass, inflammatory changes, or other parenchymal
abnormality identified.

Spleen:  Within normal limits in size and appearance.

Adrenals/Urinary Tract: The right adrenal gland is mildly thickened,
but with macroscopic fatty composition on in and out of phase
sequences (series 6, image 42). No suspicious contrast enhancement.
There is a lesion of the lateral midportion of the right kidney
which demonstrates heterogeneous internal signal, including
macroscopic fat, intrinsic T2 hypointensity, intrinsic T1
hyperintensity, and some evidence of peripheral contrast enhancement
on multiphasic imaging (series 12, image 48, series 24, image 47).
Although generally somewhat ill-defined this lesion measures
approximately 1.4 x 1.2 x 1.3 cm and does not appear significantly
changed compared to CT examinations dating back to [DATE]. No
evidence of hydronephrosis.

Stomach/Bowel: Visualized portions within the abdomen are
unremarkable.

Vascular/Lymphatic: No pathologically enlarged lymph nodes
identified. No abdominal aortic aneurysm demonstrated.

Other:  None.

Musculoskeletal: No suspicious bone lesions identified.
IMPRESSION: 1. Mild thickening of the right adrenal gland with macroscopic fatty
composition, consistent with benign adenomatous hypertrophy. No
evidence of mass or suspicious contrast enhancement to suggest
metastasis.
2. There is a lesion of the lateral midportion of the right kidney
which demonstrates heterogeneous internal signal, including
macroscopic fat, intrinsic T1 hyperintensity, and some evidence of
peripheral contrast enhancement on multiphasic imaging. Although
generally somewhat ill-defined this lesion measures approximately
1.4 cm and does not appear significantly changed compared to CT
examinations dating back to [DATE]. This is most likely a benign
renal angiomyolipoma, however given generally heterogeneous
appearance and associated contrast enhancement a small renal cell
carcinoma is difficult to exclude, these lesions occasionally
containing macroscopic fat. Attention on follow-up.
3. There is a 0.9 cm lesion of the anterior right lobe of the liver
with imaging characteristics consistent with incidental benign focal
nodular hyperplasia.

## 2020-06-18 MED ORDER — GADOBUTROL 1 MMOL/ML IV SOLN
6.0000 mL | Freq: Once | INTRAVENOUS | Status: AC | PRN
  Administered 2020-06-18: 6 mL via INTRAVENOUS

## 2020-06-24 NOTE — Progress Notes (Signed)
HEMATOLOGY/ONCOLOGY CLINIC NOTE  Date of Service: 06/24/2020  Patient Care Team: [name], MD as PCP - General (Family Medicine)  CHIEF COMPLAINTS/PURPOSE OF CONSULTATION:  contnued management of Metastatic Poorly differentiated lung adenocarcinoma  HISTORY OF PRESENTING ILLNESS:   [name] is a 55 year old male with no significant medical history.  He presented to the hospital with progressive right arm and chest pain with new right-sided neck swelling.  The patient states that he was diagnosed with carpal tunnel syndrome received an injection in August.  However, he continued to have progressive pain up and down his right arm that would also radiate to his right chest.  He took ibuprofen with no improvement.  1 week prior to admission, he developed new right sided neck swelling.  He presented to the emergency room for further evaluation.  Work-up in the emergency room was significant for a CT angiogram of the neck and chest which revealed a bulky soft tissue mass in the right neck continuing into the upper chest most compatible with extensive metastatic lymphadenopathy.  This mass measures up to 34 mm and encases multiple vessels including the right upper lobe pulmonary vessels, superior vena cava, and brachiocephalic vessel.  He also had a large poorly defined malignant appearing infiltrative mass measuring 6.2 x 5.6 x 7.9 cm within the right anterior and middle mediastinum with suspected invasion of the distal left brachiocephalic vein and probable tumor occlusion of the right subclavian and jugular veins.  There is also suspected tumor invasion of the upper SVC with string-like narrowing of the SVC but with patency of the SVC just above the right atrium.  The tumor abuts the aorta and great vessels and also results in significant narrowing of the right upper lobe pulmonary arterial vessels.  He also had a CT of the abdomen pelvis which showed a 15 mm heterogeneously enhancing  lesion in the upper pole of the right kidney concerning for renal cell carcinoma, no lymphadenopathy in the abdomen or pelvis, focal hyperenhancement of the anterior left liver most likely related to aberrant venous anatomy/flow, sclerotic lesions in the right acetabulum and left iliac bone are indeterminate.  These are likely bone islands but attention on follow-up is recommended a metastatic disease cannot be excluded.  MRI of the brain with and without contrast did not show any evidence of intracranial metastatic disease. The patient underwent a CT-guided biopsy in interventional radiology on 01/22/2019.  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   When seen today, patient reports ongoing pain and swelling to his right arm and right side of his neck.  Reports ongoing pain to his right chest.  He reports intermittent facial swelling which is worse in the morning and improves throughout the day.  He reports anorexia and a weight loss of 20 pounds over the past few weeks. He has had dizziness for the past few months.  Denies headaches.  He also noted that on the day of admission his peripheral vision was poor.  His vision is now improved.  He denies food getting stuck or difficulty swallowing but does feel as though his throat is "tight."  He reports a nonproductive cough.  Denies fevers and chills.  He is not really having any shortness of breath.  Denies abdominal pain, nausea, vomiting, constipation, diarrhea.  He has not noticed any epistaxis, hemoptysis, hematemesis, hematuria, melena, hematochezia.  The patient is single.  He has 1 daughter  who lives in [place], [place].  Reports occasional alcohol use and currently smokes 3 cigars a day since 1997.  He is currently living in a rooming house with other roommates who all have their own room.  They share a common bathroom and kitchen.  Medical oncology was asked see  the patient to make recommendations regarding his newly diagnosed poorly differentiated carcinoma.  History reviewed. No pertinent past medical history.   Current Treatment:  Atezolizumab maintenance   INTERVAL HISTORY:   David Berry. is a 55 y.o. male who is here for evaluation and management of metastatic poorly differentiated lung adenocarcinoma. He is here for C23 of Atezolizumab. The patient's last visit with [place] was on 06/04/2020. The pt reports that he is doing well overall.  The pt reports no new symptoms or concerns. He still experiences intermittent nausea in general. He also experiences slight headaches in the top of his head. He notes he still gets slightly dizzy after standing up. The pt notes he does not drink enough water, as he drinks maybe 16 oz daily. These headaches are not persistent. The pt notes that his Eliquis is no longer be delivered to him by mail.  Of note since last visit, the pt has had MR Abdomen w wo contrast ([id number]) on 06/18/2020, which revealed "1. Mild thickening of the right adrenal gland with macroscopic fatty composition, consistent with benign adenomatous hypertrophy. No evidence of mass or suspicious contrast enhancement to suggest metastasis. 2. There is a lesion of the lateral midportion of the right kidney which demonstrates heterogeneous internal signal, including macroscopic fat, intrinsic T1 hyperintensity, and some evidence of peripheral contrast enhancement on multiphasic imaging. Although generally somewhat ill-defined this lesion measures approximately 1.4 cm and does not appear significantly changed compared to CT examinations dating back to 01/21/2019. 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 Attention on follow-up. 3. There is a 0.9 cm  lesion of the anterior right lobe of the liver with imaging characteristics consistent with incidental benign focal nodular hyperplasia."  Lab results today 06/25/2020 of CBC w/diff and CMP is as follows: all values are WNL except for WBC of 3.8K, Hgb of 10.6, HCT of 34.8, MCH of 25.0, Total Bilirubin of 0.2.  On review of systems, pt reports intermittent nausea, headaches, and dizziness and denies diarrhea, skin rashes, constipation, swelling in hands, abdominal pain, Port issues, back pain, leg swelling and any other symptoms.  MEDICAL HISTORY:  Past Medical History:  Diagnosis Date  . Anxiety   . Bipolar disorder ([place])   . Blood in stool   . Cancer ([place])   . 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 GERD (gastroesophageal reflux disease)   . Lung cancer ([place]) dx'd 01/2019  . Peripheral vascular disease ([place])    blood clot in neck Sept 12, 2020    SURGICAL HISTORY: Past Surgical History:  Procedure Laterality Date  . APPENDECTOMY     teenager  . INGUINAL HERNIA REPAIR Right 2016, 2017   [facility] 2018 [facility] mesh  . IR IMAGING GUIDED PORT INSERTION  04/26/2019  . TOOTH EXTRACTION N/A 03/14/2020   Procedure: DENTAL RESTORATION/EXTRACTIONS;  Surgeon: Diona Browner, DDS;  Location: [place] [facility];  Service: Oral Surgery;  Laterality: N/A;    SOCIAL HISTORY: Social History   Socioeconomic History  .  Marital status: Single    Spouse name: Not on file  . Number of children: 1  . Years of education: GED  . Highest education level: Not on file  Occupational History    Comment: fork lift driver  Tobacco Use  . Smoking status: Former Smoker    Types: Cigars    Quit date: 01/20/2019    Years since quitting: 1.4  . Smokeless tobacco: Never Used  . Tobacco comment: smokes 3 black and mild cigars per day  Substance and Sexual Activity  . Alcohol use: Not Currently  . Drug use: No  . Sexual activity: Yes  Other Topics Concern  . Not on file   Social History Narrative   Lives alone   Caffeine- coffee, 20 oz daily, tea occas   Social Determinants of Health   Financial Resource Strain: Not on file  Food Insecurity: Not on file  Transportation Needs: Not on file  Physical Activity: Not on file  Stress: Not on file  Social Connections: Not on file  Intimate Partner Violence: Not on file    FAMILY HISTORY: Family History  Problem Relation Age of Onset  . Prostate cancer Father     ALLERGIES:  is allergic to pregabalin.  MEDICATIONS:  Current Outpatient Medications  Medication Sig Dispense Refill  . amoxicillin (AMOXIL) 500 MG capsule Take 1 capsule (500 mg total) by mouth 3 (three) times daily. (Patient not taking: Reported on 04/02/2020) 21 capsule 0  . apixaban (ELIQUIS) 5 MG TABS tablet Take 1 tablet (5 mg total) by mouth 2 (two) times daily. 60 tablet 11  . esomeprazole (NEXIUM) 40 MG capsule TAKE 1 CAPSULE(40 MG) BY MOUTH DAILY 30 capsule 1  . LORazepam (ATIVAN) 0.5 MG tablet Take 1 tablet (0.5 mg total) by mouth every 4 (four) hours as needed for anxiety. (Patient taking differently: Take 0.5 mg by mouth every 4 (four) hours as needed (Nausea). ) 60 tablet 0  . oxyCODONE-acetaminophen (PERCOCET) 5-325 MG tablet Take 1 tablet by mouth every 4 (four) hours as needed. (Patient not taking: Reported on 04/02/2020) 30 tablet 0   No current facility-administered medications for this visit.   REVIEW OF SYSTEMS:   10 Point review of Systems was done is negative except as noted above.  PHYSICAL EXAMINATION: ECOG FS:1 - Symptomatic but completely ambulatory  Vitals:   06/25/20 1425  BP: 95/61  Pulse: 69  Resp: 18  Temp: 97.8 F (36.6 C)  SpO2: 100%   Wt Readings from Last 3 Encounters:  06/04/20 131 lb 9.6 oz (59.7 kg)  05/14/20 135 lb 8 oz (61.5 kg)  04/23/20 135 lb 6.4 oz (61.4 kg)   Body mass index is 19.14 kg/m.    GENERAL:alert, in no acute distress and comfortable SKIN: no acute rashes, no  significant lesions EYES: conjunctiva are pink and non-injected, sclera anicteric OROPHARYNX: MMM, no exudates, no oropharyngeal erythema or ulceration NECK: supple, no JVD LYMPH:  no palpable lymphadenopathy in the cervical, axillary or inguinal regions LUNGS: clear to auscultation b/l with normal respiratory effort HEART: regular rate & rhythm ABDOMEN:  normoactive bowel sounds , non tender, not distended. Extremity: no pedal edema PSYCH: alert & oriented x 3 with fluent speech NEURO: no focal motor/sensory deficits  LABORATORY DATA:  I have reviewed the data as listed  . CBC Latest Ref Rng & Units 06/25/2020 06/04/2020 05/14/2020  WBC 4.0 - 10.5 K/uL 3.8(L) 3.8(L) 4.0  Hemoglobin 13.0 - 17.0 g/dL 10.6(L) 11.3(L) 11.1(L)  Hematocrit 39.0 -  52.0 % 34.8(L) 37.4(L) 35.6(L)  Platelets 150 - 400 K/uL 275 275 343    . CMP Latest Ref Rng & Units 06/25/2020 06/04/2020 05/14/2020  Glucose 70 - 99 mg/dL 91 91 95  BUN 6 - 20 mg/dL _0 Creatinine 0.61 - 1.24 mg/dL 0.95 0.92 0.94  Sodium 135 - 145 mmol/L 138 141 140  Potassium 3.5 - 5.1 mmol/L 4.5 4.1 4.2  Chloride 98 - 111 mmol/L 104 107 107  CO2 22 - 32 mmol/L _1 Calcium 8.9 - 10.3 mg/dL 9.1 9.4 9.2  Total Protein 6.5 - 8.1 g/dL 7.0 7.5 7.0  Total Bilirubin 0.3 - 1.2 mg/dL 0.2(L) 0.3 0.4  Alkaline Phos 38 - 126 U/L 105 111 109  AST 15 - 41 U/L 15 15 13(L)  ALT 0 - 44 U/L _2 01/22/2019 [facility]: Tumor Mutational Burden     01/22/2019 PD-L1 Immunohistochemistry Analysis    01/22/2019 Soft Tissue Needle Core Biopsy Surgical Pathology     RADIOGRAPHIC STUDIES: I have personally reviewed the radiological images as listed and agreed with the findings in the report. MR Abdomen W Wo Contrast  Result Date: 06/19/2020 CLINICAL DATA:  Indeterminate adrenal nodule in the setting of lung cancer, characterize EXAM: MRI ABDOMEN WITHOUT AND WITH CONTRAST TECHNIQUE: Multiplanar multisequence MR imaging of the abdomen was  performed both before and after the administration of intravenous contrast. CONTRAST:  14m GADAVIST GADOBUTROL 1 MMOL/ML IV SOLN COMPARISON:  CT chest abdomen pelvis, 05/21/2020, CT abdomen pelvis, 01/21/2019 FINDINGS: Lower chest: No acute findings. Hepatobiliary: There is a rounded, arterially hyperenhancing lesion of the anterior right lobe of the liver, hepatic segment VII, measuring 0.9 x 0.8 cm, which fades to background parenchymal enhancement on multiphasic imaging. This demonstrates very subtle underlying intrinsic T2 hyperintensity and macroscopic fatty dropout on in and opposed phase imaging. No other parenchymal abnormality identified. Pancreas: No mass, inflammatory changes, or other parenchymal abnormality identified. Spleen:  Within normal limits in size and appearance. Adrenals/Urinary Tract: The right adrenal gland is mildly thickened, but with macroscopic fatty composition on in and out of phase sequences (series 6, image 42). No suspicious contrast enhancement. There is a lesion of the lateral midportion of the right kidney which demonstrates heterogeneous internal signal, including macroscopic fat, intrinsic T2 hypointensity, intrinsic T1 hyperintensity, and some evidence of peripheral contrast enhancement on multiphasic imaging (series 12, image 48, series 24, image 47). Although generally somewhat ill-defined this lesion measures approximately 1.4 x 1.2 x 1.3 cm and does not appear significantly changed compared to CT examinations dating back to 01/21/2019. No evidence of hydronephrosis. Stomach/Bowel: Visualized portions within the abdomen are unremarkable. Vascular/Lymphatic: No pathologically enlarged lymph nodes identified. No abdominal aortic aneurysm demonstrated. Other:  None. Musculoskeletal: No suspicious bone lesions identified. IMPRESSION: 1. Mild thickening of the right adrenal gland with macroscopic fatty composition, consistent with benign adenomatous hypertrophy. No evidence of  mass or suspicious contrast enhancement to suggest metastasis. 2. There is a lesion of the lateral midportion of the right kidney which demonstrates heterogeneous internal signal, including macroscopic fat, intrinsic T1 hyperintensity, and some evidence of peripheral contrast enhancement on multiphasic imaging. Although generally somewhat ill-defined this lesion measures approximately 1.4 cm and does not appear significantly changed compared to CT examinations dating back to 01/21/2019. 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 Attention on follow-up. 3. There is a 0.9 cm lesion of the anterior right lobe of the liver with imaging characteristics consistent with incidental benign focal nodular hyperplasia. Electronically Signed   By: Eddie Candle M.D.   On: 06/19/2020 10:53    ASSESSMENT & PLAN:   This is a 55 year old malewith  1. Metastatic Poorly differentiated lung adenocarcinoma Presented with Large neck mass, mediastinal mass, renal mass, and questionable bone lesions no brain mets on MRI brain 01/22/2019 PD-L1 Immunohistochemistry Analysis which revealed "Tumor Proportion Score (TPS) 50%" 05/16/2019 PET/CT ([id number]) revealed "Radiation changes in the right hemithorax, as above. Improving mediastinal nodal metastases. Prior bulky right cervical metastases have resolved. No evidence of metastatic disease in the abdomen/pelvis." 07/19/2019 Esophagus Scan ([id number]) revealed "Mild stricture at the C6-7 level likely related to prior esophageal surgery. Barium tablet was slow to pass through this area but did pass through after 1 minutes. Hiatal hernia with moderate gastroesophageal reflux. Moderate stricture above the hiatal hernia likely due to reflux. Barium tablet did not pass this area.  There are changes of esophagitis which are likely due to reflux and possibly radiation as well."  10/10/19 of  CT Chest W Contrast ([id number])- no evidence of lung cancer progression at this time.   2. 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 Symptomatic hemorrhoids  5. Normocytic anemia -Likely due to underlying malignancy  6.Thrombocytosis -- likely due to paraneoplastic effect of tumor and reactive due to inflammation and tissue inflammation from RT.-- now resolved.  7. Nicotine dependence -He was counseled about tobacco cessation. -has quit since cancer diagnosis.  PLAN:  -Discussed pt labwork today, 06/25/2020; blood counts stable, chemistries normal.  -Discussed pt MR Abdomen w wo contrast ([id number]) on 06/18/2020; no evidence of disease found. Lesion in kidney is stable and same as prior scan. -Advised pt there is no major change in plan or treatment at this time. Will continue. -Advised pt his headaches and dizziness could be due to dehydration and not enough water intake.  -Recommended pt drink 48-64 oz of water daily. -The pt has no prohibitive toxicities from continuing C22 of Atezolizumab at this time.  -Refill Lorazepam and Eliquis. -Will see back in 6 weeks with labs.   FOLLOW UP: Plz move MD visit from 3/9 to 08/07/2020 and then every other treatment Continue Teqcentric q3weeks with labs - plz schedule for next 4 doses   The total time spent in the appointment was 20 minutes and more than 50% was on counseling and direct patient cares.  All of the patient's questions were answered with apparent satisfaction. The patient knows to call the clinic with any problems, questions or concerns.   Sullivan Lone MD [place] AAHIVMS Operating Room Services [facility] Hematology/Oncology Physician [facility]  (Office):       [phone] (Work  cell):  [phone] (Fax):           [phone]  06/24/2020 10:05 AM  I, Reinaldo Raddle, am acting as scribe for Dr. Sullivan Lone, MD.   .I have reviewed the above documentation for accuracy and completeness, and I agree with the above. Brunetta Genera MD

## 2020-06-25 ENCOUNTER — Inpatient Hospital Stay (HOSPITAL_BASED_OUTPATIENT_CLINIC_OR_DEPARTMENT_OTHER): Payer: Medicaid Other | Admitting: Hematology

## 2020-06-25 ENCOUNTER — Inpatient Hospital Stay: Payer: Medicaid Other

## 2020-06-25 ENCOUNTER — Other Ambulatory Visit: Payer: Self-pay

## 2020-06-25 ENCOUNTER — Inpatient Hospital Stay: Payer: Medicaid Other | Attending: Hematology

## 2020-06-25 VITALS — BP 95/61 | HR 69 | Temp 97.8°F | Resp 18 | Ht 71.0 in | Wt 137.2 lb

## 2020-06-25 DIAGNOSIS — C3411 Malignant neoplasm of upper lobe, right bronchus or lung: Secondary | ICD-10-CM | POA: Diagnosis present

## 2020-06-25 DIAGNOSIS — C3491 Malignant neoplasm of unspecified part of right bronchus or lung: Secondary | ICD-10-CM

## 2020-06-25 DIAGNOSIS — F418 Other specified anxiety disorders: Secondary | ICD-10-CM | POA: Diagnosis not present

## 2020-06-25 DIAGNOSIS — Z79899 Other long term (current) drug therapy: Secondary | ICD-10-CM | POA: Insufficient documentation

## 2020-06-25 DIAGNOSIS — Z8042 Family history of malignant neoplasm of prostate: Secondary | ICD-10-CM | POA: Diagnosis not present

## 2020-06-25 DIAGNOSIS — Z5112 Encounter for antineoplastic immunotherapy: Secondary | ICD-10-CM | POA: Diagnosis not present

## 2020-06-25 DIAGNOSIS — C771 Secondary and unspecified malignant neoplasm of intrathoracic lymph nodes: Secondary | ICD-10-CM | POA: Insufficient documentation

## 2020-06-25 DIAGNOSIS — Z95828 Presence of other vascular implants and grafts: Secondary | ICD-10-CM

## 2020-06-25 DIAGNOSIS — D649 Anemia, unspecified: Secondary | ICD-10-CM | POA: Insufficient documentation

## 2020-06-25 DIAGNOSIS — Z7901 Long term (current) use of anticoagulants: Secondary | ICD-10-CM | POA: Insufficient documentation

## 2020-06-25 DIAGNOSIS — Z87891 Personal history of nicotine dependence: Secondary | ICD-10-CM | POA: Insufficient documentation

## 2020-06-25 DIAGNOSIS — Z86718 Personal history of other venous thrombosis and embolism: Secondary | ICD-10-CM | POA: Insufficient documentation

## 2020-06-25 DIAGNOSIS — Z7189 Other specified counseling: Secondary | ICD-10-CM

## 2020-06-25 DIAGNOSIS — Z5111 Encounter for antineoplastic chemotherapy: Secondary | ICD-10-CM

## 2020-06-25 LAB — CMP (CANCER CENTER ONLY)
ALT: 10 U/L (ref 0–44)
AST: 15 U/L (ref 15–41)
Albumin: 3.9 g/dL (ref 3.5–5.0)
Alkaline Phosphatase: 105 U/L (ref 38–126)
Anion gap: 6 (ref 5–15)
BUN: 7 mg/dL (ref 6–20)
CO2: 28 mmol/L (ref 22–32)
Calcium: 9.1 mg/dL (ref 8.9–10.3)
Chloride: 104 mmol/L (ref 98–111)
Creatinine: 0.95 mg/dL (ref 0.61–1.24)
GFR, Estimated: >60 mL/min (ref >60)
Glucose, Bld: 91 mg/dL (ref 70–99)
Potassium: 4.5 mmol/L (ref 3.5–5.1)
Sodium: 138 mmol/L (ref 135–145)
Total Bilirubin: 0.2 mg/dL — ABNORMAL LOW (ref 0.3–1.2)
Total Protein: 7 g/dL (ref 6.5–8.1)

## 2020-06-25 LAB — CBC WITH DIFFERENTIAL/PLATELET
Abs Immature Granulocytes: 0 10*3/uL (ref 0.00–0.07)
Basophils Absolute: 0 10*3/uL (ref 0.0–0.1)
Basophils Relative: 1 %
Eosinophils Absolute: 0.1 10*3/uL (ref 0.0–0.5)
Eosinophils Relative: 1 %
HCT: 34.8 % — ABNORMAL LOW (ref 39.0–52.0)
Hemoglobin: 10.6 g/dL — ABNORMAL LOW (ref 13.0–17.0)
Immature Granulocytes: 0 %
Lymphocytes Relative: 25 %
Lymphs Abs: 1 10*3/uL (ref 0.7–4.0)
MCH: 25 pg — ABNORMAL LOW (ref 26.0–34.0)
MCHC: 30.5 g/dL (ref 30.0–36.0)
MCV: 82.1 fL (ref 80.0–100.0)
Monocytes Absolute: 0.6 10*3/uL (ref 0.1–1.0)
Monocytes Relative: 15 %
Neutro Abs: 2.2 10*3/uL (ref 1.7–7.7)
Neutrophils Relative %: 58 %
Platelets: 275 10*3/uL (ref 150–400)
RBC: 4.24 MIL/uL (ref 4.22–5.81)
RDW: 14.5 % (ref 11.5–15.5)
WBC: 3.8 10*3/uL — ABNORMAL LOW (ref 4.0–10.5)
nRBC: 0 % (ref 0.0–0.2)

## 2020-06-25 MED ORDER — FAMOTIDINE 20 MG PO TABS
ORAL_TABLET | ORAL | Status: AC
  Filled 2020-06-25: qty 1

## 2020-06-25 MED ORDER — ACETAMINOPHEN 325 MG PO TABS
ORAL_TABLET | ORAL | Status: AC
  Filled 2020-06-25: qty 2

## 2020-06-25 MED ORDER — SODIUM CHLORIDE 0.9% FLUSH
10.0000 mL | INTRAVENOUS | Status: DC | PRN
  Administered 2020-06-25: 10 mL
  Filled 2020-06-25: qty 10

## 2020-06-25 MED ORDER — APIXABAN 5 MG PO TABS: 5.0000 mg | ORAL_TABLET | Freq: Two times a day (BID) | ORAL | 11 refills | Status: DC

## 2020-06-25 MED ORDER — DIPHENHYDRAMINE HCL 25 MG PO CAPS
ORAL_CAPSULE | ORAL | Status: AC
  Filled 2020-06-25: qty 1

## 2020-06-25 MED ORDER — HEPARIN SOD (PORK) LOCK FLUSH 100 UNIT/ML IV SOLN
500.0000 [IU] | Freq: Once | INTRAVENOUS | Status: AC | PRN
  Administered 2020-06-25: 500 [IU]
  Filled 2020-06-25: qty 5

## 2020-06-25 MED ORDER — FAMOTIDINE 20 MG PO TABS
20.0000 mg | ORAL_TABLET | Freq: Once | ORAL | Status: AC
  Administered 2020-06-25: 20 mg via ORAL

## 2020-06-25 MED ORDER — SODIUM CHLORIDE 0.9 % IV SOLN
Freq: Once | INTRAVENOUS | Status: AC
  Filled 2020-06-25: qty 250

## 2020-06-25 MED ORDER — SODIUM CHLORIDE 0.9 % IV SOLN
1200.0000 mg | Freq: Once | INTRAVENOUS | Status: AC
  Administered 2020-06-25: 1200 mg via INTRAVENOUS
  Filled 2020-06-25: qty 20

## 2020-06-25 MED ORDER — ACETAMINOPHEN 325 MG PO TABS
650.0000 mg | ORAL_TABLET | Freq: Once | ORAL | Status: AC
  Administered 2020-06-25: 650 mg via ORAL

## 2020-06-25 MED ORDER — DIPHENHYDRAMINE HCL 25 MG PO TABS
25.0000 mg | ORAL_TABLET | Freq: Once | ORAL | Status: AC
Start: 2020-06-25 — End: 2020-06-25
  Administered 2020-06-25: 25 mg via ORAL
  Filled 2020-06-25: qty 1

## 2020-06-25 MED ORDER — LORAZEPAM 0.5 MG PO TABS: 0.5000 mg | ORAL_TABLET | Freq: Three times a day (TID) | ORAL | 0 refills | Status: DC | PRN

## 2020-06-25 NOTE — Patient Instructions (Signed)
Implanted Port Insertion, Care After This sheet gives you information about how to care for yourself after your procedure. Your health care provider may also give you more specific instructions. If you have problems or questions, contact your health care provider. What can I expect after the procedure? After the procedure, it is common to have:  Discomfort at the port insertion site.  Bruising on the skin over the port. This should improve over 3-4 days. Follow these instructions at home: Port care  After your port is placed, you will get a manufacturer's information card. The card has information about your port. Keep this card with you at all times.  Take care of the port as told by your health care provider. Ask your health care provider if you or a family member can get training for taking care of the port at home. A home health care nurse may also take care of the port.  Make sure to remember what type of port you have. Incision care  Follow instructions from your health care provider about how to take care of your port insertion site. Make sure you: ? Wash your hands with soap and water before and after you change your bandage (dressing). If soap and water are not available, use hand sanitizer. ? Change your dressing as told by your health care provider. ? Leave stitches (sutures), skin glue, or adhesive strips in place. These skin closures may need to stay in place for 2 weeks or longer. If adhesive strip edges start to loosen and curl up, you may trim the loose edges. Do not remove adhesive strips completely unless your health care provider tells you to do that.  Check your port insertion site every day for signs of infection. Check for: ? Redness, swelling, or pain. ? Fluid or blood. ? Warmth. ? Pus or a bad smell.      Activity  Return to your normal activities as told by your health care provider. Ask your health care provider what activities are safe for you.  Do not  lift anything that is heavier than 10 lb (4.5 kg), or the limit that you are told, until your health care provider says that it is safe. General instructions  Take over-the-counter and prescription medicines only as told by your health care provider.  Do not take baths, swim, or use a hot tub until your health care provider approves. Ask your health care provider if you may take showers. You may only be allowed to take sponge baths.  Do not drive for 24 hours if you were given a sedative during your procedure.  Wear a medical alert bracelet in case of an emergency. This will tell any health care providers that you have a port.  Keep all follow-up visits as told by your health care provider. This is important. Contact a health care provider if:  You cannot flush your port with saline as directed, or you cannot draw blood from the port.  You have a fever or chills.  You have redness, swelling, or pain around your port insertion site.  You have fluid or blood coming from your port insertion site.  Your port insertion site feels warm to the touch.  You have pus or a bad smell coming from the port insertion site. Get help right away if:  You have chest pain or shortness of breath.  You have bleeding from your port that you cannot control. Summary  Take care of the port as told by your   health care provider. Keep the manufacturer's information card with you at all times.  Change your dressing as told by your health care provider.  Contact a health care provider if you have a fever or chills or if you have redness, swelling, or pain around your port insertion site.  Keep all follow-up visits as told by your health care provider. This information is not intended to replace advice given to you by your health care provider. Make sure you discuss any questions you have with your health care provider. Document Revised: 11/22/2017 Document Reviewed: 11/22/2017 Elsevier Patient Education   2021 Elsevier Inc.  

## 2020-06-25 NOTE — Patient Instructions (Signed)
[facility] Discharge Instructions for Patients Receiving Chemotherapy  Today you received the following chemotherapy agents: atezolizumab Gildardo Pounds).  To help prevent nausea and vomiting after your treatment, we encourage you to take your nausea medication as directed.   If you develop nausea and vomiting that is not controlled by your nausea medication, call the clinic.   BELOW ARE SYMPTOMS THAT SHOULD BE REPORTED IMMEDIATELY:  *FEVER GREATER THAN 100.5 F  *CHILLS WITH OR WITHOUT FEVER  NAUSEA AND VOMITING THAT IS NOT CONTROLLED WITH YOUR NAUSEA MEDICATION  *UNUSUAL SHORTNESS OF BREATH  *UNUSUAL BRUISING OR BLEEDING  TENDERNESS IN MOUTH AND THROAT WITH OR WITHOUT PRESENCE OF ULCERS  *URINARY PROBLEMS  *BOWEL PROBLEMS  UNUSUAL RASH Items with * indicate a potential emergency and should be followed up as soon as possible.  Feel free to call the clinic should you have any questions or concerns. The clinic phone number is [phone].  Please show the [facility] at check-in to the Emergency Department and triage nurse.

## 2020-06-26 ENCOUNTER — Other Ambulatory Visit: Payer: Medicaid Other

## 2020-06-26 ENCOUNTER — Ambulatory Visit: Payer: Medicaid Other

## 2020-07-16 ENCOUNTER — Inpatient Hospital Stay: Payer: Medicaid Other | Attending: Hematology

## 2020-07-16 ENCOUNTER — Other Ambulatory Visit: Payer: Self-pay

## 2020-07-16 ENCOUNTER — Inpatient Hospital Stay: Payer: Medicaid Other

## 2020-07-16 ENCOUNTER — Ambulatory Visit: Payer: Medicaid Other | Admitting: Hematology

## 2020-07-16 VITALS — BP 106/68 | HR 88 | Temp 98.0°F | Resp 16

## 2020-07-16 DIAGNOSIS — Z5112 Encounter for antineoplastic immunotherapy: Secondary | ICD-10-CM | POA: Diagnosis not present

## 2020-07-16 DIAGNOSIS — Z87891 Personal history of nicotine dependence: Secondary | ICD-10-CM | POA: Insufficient documentation

## 2020-07-16 DIAGNOSIS — C3491 Malignant neoplasm of unspecified part of right bronchus or lung: Secondary | ICD-10-CM

## 2020-07-16 DIAGNOSIS — C3411 Malignant neoplasm of upper lobe, right bronchus or lung: Secondary | ICD-10-CM | POA: Diagnosis present

## 2020-07-16 DIAGNOSIS — Z5111 Encounter for antineoplastic chemotherapy: Secondary | ICD-10-CM

## 2020-07-16 DIAGNOSIS — Z7189 Other specified counseling: Secondary | ICD-10-CM

## 2020-07-16 LAB — CMP (CANCER CENTER ONLY)
ALT: 9 U/L (ref 0–44)
AST: 14 U/L — ABNORMAL LOW (ref 15–41)
Albumin: 4 g/dL (ref 3.5–5.0)
Alkaline Phosphatase: 105 U/L (ref 38–126)
Anion gap: 10 (ref 5–15)
BUN: 10 mg/dL (ref 6–20)
CO2: 26 mmol/L (ref 22–32)
Calcium: 9.3 mg/dL (ref 8.9–10.3)
Chloride: 105 mmol/L (ref 98–111)
Creatinine: 1 mg/dL (ref 0.61–1.24)
GFR, Estimated: >60 mL/min (ref >60)
Glucose, Bld: 111 mg/dL — ABNORMAL HIGH (ref 70–99)
Potassium: 4 mmol/L (ref 3.5–5.1)
Sodium: 141 mmol/L (ref 135–145)
Total Bilirubin: 0.4 mg/dL (ref 0.3–1.2)
Total Protein: 7.3 g/dL (ref 6.5–8.1)

## 2020-07-16 LAB — CBC WITH DIFFERENTIAL/PLATELET
Abs Immature Granulocytes: 0.01 10*3/uL (ref 0.00–0.07)
Basophils Absolute: 0 10*3/uL (ref 0.0–0.1)
Basophils Relative: 1 %
Eosinophils Absolute: 0.1 10*3/uL (ref 0.0–0.5)
Eosinophils Relative: 2 %
HCT: 36.1 % — ABNORMAL LOW (ref 39.0–52.0)
Hemoglobin: 11.1 g/dL — ABNORMAL LOW (ref 13.0–17.0)
Immature Granulocytes: 0 %
Lymphocytes Relative: 25 %
Lymphs Abs: 1 10*3/uL (ref 0.7–4.0)
MCH: 24.7 pg — ABNORMAL LOW (ref 26.0–34.0)
MCHC: 30.7 g/dL (ref 30.0–36.0)
MCV: 80.2 fL (ref 80.0–100.0)
Monocytes Absolute: 0.6 10*3/uL (ref 0.1–1.0)
Monocytes Relative: 16 %
Neutro Abs: 2.3 10*3/uL (ref 1.7–7.7)
Neutrophils Relative %: 56 %
Platelets: 300 10*3/uL (ref 150–400)
RBC: 4.5 MIL/uL (ref 4.22–5.81)
RDW: 14.9 % (ref 11.5–15.5)
WBC: 4 10*3/uL (ref 4.0–10.5)
nRBC: 0 % (ref 0.0–0.2)

## 2020-07-16 MED ORDER — SODIUM CHLORIDE 0.9 % IV SOLN
1200.0000 mg | Freq: Once | INTRAVENOUS | Status: AC
  Administered 2020-07-16: 1200 mg via INTRAVENOUS
  Filled 2020-07-16: qty 20

## 2020-07-16 MED ORDER — ACETAMINOPHEN 325 MG PO TABS
650.0000 mg | ORAL_TABLET | Freq: Once | ORAL | Status: AC
  Administered 2020-07-16: 650 mg via ORAL

## 2020-07-16 MED ORDER — HEPARIN SOD (PORK) LOCK FLUSH 100 UNIT/ML IV SOLN
500.0000 [IU] | Freq: Once | INTRAVENOUS | Status: AC | PRN
  Administered 2020-07-16: 500 [IU]
  Filled 2020-07-16: qty 5

## 2020-07-16 MED ORDER — DIPHENHYDRAMINE HCL 25 MG PO CAPS
ORAL_CAPSULE | ORAL | Status: AC
  Filled 2020-07-16: qty 1

## 2020-07-16 MED ORDER — ACETAMINOPHEN 325 MG PO TABS
ORAL_TABLET | ORAL | Status: AC
  Filled 2020-07-16: qty 2

## 2020-07-16 MED ORDER — DIPHENHYDRAMINE HCL 25 MG PO TABS
25.0000 mg | ORAL_TABLET | Freq: Once | ORAL | Status: AC
  Administered 2020-07-16: 25 mg via ORAL
  Filled 2020-07-16: qty 1

## 2020-07-16 MED ORDER — SODIUM CHLORIDE 0.9% FLUSH
10.0000 mL | INTRAVENOUS | Status: DC | PRN
  Administered 2020-07-16: 10 mL
  Filled 2020-07-16: qty 10

## 2020-07-16 MED ORDER — SODIUM CHLORIDE 0.9 % IV SOLN
Freq: Once | INTRAVENOUS | Status: AC
  Filled 2020-07-16: qty 250

## 2020-07-16 MED ORDER — FAMOTIDINE 20 MG PO TABS
ORAL_TABLET | ORAL | Status: AC
  Filled 2020-07-16: qty 1

## 2020-07-16 MED ORDER — FAMOTIDINE 20 MG PO TABS
20.0000 mg | ORAL_TABLET | Freq: Once | ORAL | Status: AC
  Administered 2020-07-16: 20 mg via ORAL

## 2020-07-16 NOTE — Patient Instructions (Signed)
[facility] Discharge Instructions for Patients Receiving Chemotherapy  Today you received the following chemotherapy agents: Tecentriq  To help prevent nausea and vomiting after your treatment, we encourage you to take your nausea medication as directed.   If you develop nausea and vomiting that is not controlled by your nausea medication, call the clinic.   BELOW ARE SYMPTOMS THAT SHOULD BE REPORTED IMMEDIATELY:  *FEVER GREATER THAN 100.5 F  *CHILLS WITH OR WITHOUT FEVER  NAUSEA AND VOMITING THAT IS NOT CONTROLLED WITH YOUR NAUSEA MEDICATION  *UNUSUAL SHORTNESS OF BREATH  *UNUSUAL BRUISING OR BLEEDING  TENDERNESS IN MOUTH AND THROAT WITH OR WITHOUT PRESENCE OF ULCERS  *URINARY PROBLEMS  *BOWEL PROBLEMS  UNUSUAL RASH Items with * indicate a potential emergency and should be followed up as soon as possible.  Feel free to call the clinic should you have any questions or concerns. The clinic phone number is [phone].  Please show the CHEMO ALERT CARD at check-in to the Emergency Department and triage nurse.   

## 2020-08-06 NOTE — Progress Notes (Signed)
HEMATOLOGY/ONCOLOGY CLINIC NOTE  Date of Service: 08/07/2020  Patient Care Team: [name], MD as PCP - General (Family Medicine)   CHIEF COMPLAINTS/PURPOSE OF CONSULTATION:  contnued management of Metastatic Poorly differentiated lung adenocarcinoma  HISTORY OF PRESENTING ILLNESS:   David Berry is a 55 year old male with no significant medical history.  He presented to the hospital with progressive right arm and chest pain with new right-sided neck swelling.  The patient states that he was diagnosed with carpal tunnel syndrome received an injection in August.  However, he continued to have progressive pain up and down his right arm that would also radiate to his right chest.  He took ibuprofen with no improvement.  1 week prior to admission, he developed new right sided neck swelling.  He presented to the emergency room for further evaluation.  Work-up in the emergency room was significant for a CT angiogram of the neck and chest which revealed a bulky soft tissue mass in the right neck continuing into the upper chest most compatible with extensive metastatic lymphadenopathy.  This mass measures up to 34 mm and encases multiple vessels including the right upper lobe pulmonary vessels, superior vena cava, and brachiocephalic vessel.  He also had a large poorly defined malignant appearing infiltrative mass measuring 6.2 x 5.6 x 7.9 cm within the right anterior and middle mediastinum with suspected invasion of the distal left brachiocephalic vein and probable tumor occlusion of the right subclavian and jugular veins.  There is also suspected tumor invasion of the upper SVC with string-like narrowing of the SVC but with patency of the SVC just above the right atrium.  The tumor abuts the aorta and great vessels and also results in significant narrowing of the right upper lobe pulmonary arterial vessels.  He also had a CT of the abdomen pelvis which showed a 15 mm heterogeneously enhancing  lesion in the upper pole of the right kidney concerning for renal cell carcinoma, no lymphadenopathy in the abdomen or pelvis, focal hyperenhancement of the anterior left liver most likely related to aberrant venous anatomy/flow, sclerotic lesions in the right acetabulum and left iliac bone are indeterminate.  These are likely bone islands but attention on follow-up is recommended a metastatic disease cannot be excluded.  MRI of the brain with and without contrast did not show any evidence of intracranial metastatic disease. The patient underwent a CT-guided biopsy in interventional radiology on 01/22/2019.  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   When seen today, patient reports ongoing pain and swelling to his right arm and right side of his neck.  Reports ongoing pain to his right chest.  He reports intermittent facial swelling which is worse in the morning and improves throughout the day.  He reports anorexia and a weight loss of 20 pounds over the past few weeks. He has had dizziness for the past few months.  Denies headaches.  He also noted that on the day of admission his peripheral vision was poor.  His vision is now improved.  He denies food getting stuck or difficulty swallowing but does feel as though his throat is "tight."  He reports a nonproductive cough.  Denies fevers and chills.  He is not really having any shortness of breath.  Denies abdominal pain, nausea, vomiting, constipation, diarrhea.  He has not noticed any epistaxis, hemoptysis, hematemesis, hematuria, melena, hematochezia.  The patient is single.  He has 1  daughter who lives in [place], [place].  Reports occasional alcohol use and currently smokes 3 cigars a day since 1997.  He is currently living in a rooming house with other roommates who all have their own room.  They share a common bathroom and kitchen.  Medical oncology was asked see  the patient to make recommendations regarding his newly diagnosed poorly differentiated carcinoma.  History reviewed. No pertinent past medical history.  Current Treatment:  Atezolizumab maintenance   INTERVAL HISTORY:   David Berry. is a 55 y.o. male who is here for evaluation and management of metastatic poorly differentiated lung adenocarcinoma. He is here for C25 of Atezolizumab. The patient's last visit with David Berry was on 06/25/2020. The pt reports that he is doing well overall.  The pt reports that he has been getting intermittent headaches. The pt notes that he has continued the same brand and amount of coffee he drinks daily. The pt notes they are intermittent and throbbing sensations that are more sensory headaches. The lights bother his vision slightly. The pt notes that the pain can be on both or one side of his head. This has been going on steady for three months. The pt notes he takes Ibuprofen and this helps them. The pt notes that his gums and need for dentures is messing up his eating and appetite. The pt notes he was supposed to see Dr. Nyoka Cowden and has not seen or heard from them. The pt notes that he needs another referral for dentures. The pt notes that because he is chewing with his gums and not teeth, that could be causing the headaches. 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  Lab results today 08/07/2020 of CBC w/diff and CMP is as follows: all values are WNL except for Hgb of 10.8, HCT of 35.9, MCV of 79.8, MCH of 24.0, Glucose of 120, AST of 14.  On review of systems, pt reports intermittent headaches and denies neck swelling, SOB, chest pain, leg swelling, arm swelling, sudden weight loss, nasal drainage, skull pain, pain over sinuses, change in bowel habits, testicular pain/swelling, skin rashes, diarrhea, and any other symptoms.  MEDICAL HISTORY:  Past Medical History:  Diagnosis Date  . Anxiety   . Bipolar  disorder ([place])   . Blood in stool   . Cancer ([place])   . 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 GERD (gastroesophageal reflux disease)   . Lung cancer ([place]) dx'd 01/2019  . Peripheral vascular disease ([place])    blood clot in neck Sept 12, 2020    SURGICAL HISTORY: Past Surgical History:  Procedure Laterality Date  . APPENDECTOMY     teenager  . INGUINAL HERNIA REPAIR Right 2016, 2017   [facility], 2018 [facility] mesh  . IR IMAGING GUIDED PORT INSERTION  04/26/2019  . TOOTH EXTRACTION N/A 03/14/2020   Procedure: DENTAL RESTORATION/EXTRACTIONS;  Surgeon: Diona Browner, DDS;  Location: [facility];  Service: Oral Surgery;  Laterality: N/A;    SOCIAL HISTORY: Social History   Socioeconomic History  . Marital status: Single    Spouse name: Not on file  . Number of children: 1  . Years of education: GED  . Highest education level: Not on file  Occupational History    Comment: fork lift driver  Tobacco Use  . Smoking status: Former Smoker    Types: Cigars    Quit date: 01/20/2019  Years since quitting: 1.5  . Smokeless tobacco: Never Used  . Tobacco comment: smokes 3 black and mild cigars per day  Substance and Sexual Activity  . Alcohol use: Not Currently  . Drug use: No  . Sexual activity: Yes  Other Topics Concern  . Not on file  Social History Narrative   Lives alone   Caffeine- coffee, 20 oz daily, tea occas   Social Determinants of Health   Financial Resource Strain: Not on file  Food Insecurity: Not on file  Transportation Needs: Not on file  Physical Activity: Not on file  Stress: Not on file  Social Connections: Not on file  Intimate Partner Violence: Not on file    FAMILY HISTORY: Family History  Problem Relation Age of Onset  . Prostate cancer Father     ALLERGIES:  is allergic to pregabalin.  MEDICATIONS:  Current Outpatient Medications  Medication Sig Dispense Refill  . amoxicillin (AMOXIL) 500 MG capsule Take 1  capsule (500 mg total) by mouth 3 (three) times daily. (Patient not taking: Reported on 04/02/2020) 21 capsule 0  . apixaban (ELIQUIS) 5 MG TABS tablet Take 1 tablet (5 mg total) by mouth 2 (two) times daily. 60 tablet 11  . esomeprazole (NEXIUM) 40 MG capsule TAKE 1 CAPSULE(40 MG) BY MOUTH DAILY 30 capsule 1  . LORazepam (ATIVAN) 0.5 MG tablet Take 1 tablet (0.5 mg total) by mouth every 8 (eight) hours as needed for anxiety. 60 tablet 0  . oxyCODONE-acetaminophen (PERCOCET) 5-325 MG tablet Take 1 tablet by mouth every 4 (four) hours as needed. (Patient not taking: Reported on 04/02/2020) 30 tablet 0   No current facility-administered medications for this visit.   Facility-Administered Medications Ordered in Other Visits  Medication Dose Route Frequency Provider Last Rate Last Admin  . sodium chloride flush (NS) 0.9 % injection 10 mL  10 mL Intracatheter PRN Brunetta Genera, MD   10 mL at 08/07/20 1352   REVIEW OF SYSTEMS:   10 Point review of Systems was done is negative except as noted above.  PHYSICAL EXAMINATION: ECOG FS:1 - Symptomatic but completely ambulatory  Vitals:   08/07/20 1410  BP: 99/71  Pulse: 90  Resp: 18  Temp: 97.6 F (36.4 C)  SpO2: 100%   Wt Readings from Last 3 Encounters:  06/25/20 137 lb 3.2 oz (62.2 kg)  06/04/20 131 lb 9.6 oz (59.7 kg)  05/14/20 135 lb 8 oz (61.5 kg)   Body mass index is 18.87 kg/m.    GENERAL:alert, in no acute distress and comfortable SKIN: no acute rashes, no significant lesions EYES: conjunctiva are pink and non-injected, sclera anicteric OROPHARYNX: MMM, no exudates, no oropharyngeal erythema or ulceration NECK: supple, no JVD LYMPH:  no palpable lymphadenopathy in the cervical, axillary or inguinal regions LUNGS: clear to auscultation b/l with normal respiratory effort HEART: regular rate & rhythm ABDOMEN:  normoactive bowel sounds , non tender, not distended. Extremity: no pedal edema PSYCH: alert & oriented x 3 with  fluent speech NEURO: no focal motor/sensory deficits   LABORATORY DATA:  I have reviewed the data as listed  . CBC Latest Ref Rng & Units 08/07/2020 07/16/2020 06/25/2020  WBC 4.0 - 10.5 K/uL 4.8 4.0 3.8(L)  Hemoglobin 13.0 - 17.0 g/dL 10.8(L) 11.1(L) 10.6(L)  Hematocrit 39.0 - 52.0 % 35.9(L) 36.1(L) 34.8(L)  Platelets 150 - 400 K/uL 309 300 275    . CMP Latest Ref Rng & Units 07/16/2020 06/25/2020 06/04/2020  Glucose 70 - 99 mg/dL 111(H)  91 91  BUN 6 - 20 mg/dL $Remove'10 7 10  'vqVSZhG$ Creatinine 0.61 - 1.24 mg/dL 1.00 0.95 0.92  Sodium 135 - 145 mmol/L 141 138 141  Potassium 3.5 - 5.1 mmol/L 4.0 4.5 4.1  Chloride 98 - 111 mmol/L 105 104 107  CO2 22 - 32 mmol/L $RemoveB'26 28 27  'mrUhubfj$ Calcium 8.9 - 10.3 mg/dL 9.3 9.1 9.4  Total Protein 6.5 - 8.1 g/dL 7.3 7.0 7.5  Total Bilirubin 0.3 - 1.2 mg/dL 0.4 0.2(L) 0.3  Alkaline Phos 38 - 126 U/L 105 105 111  AST 15 - 41 U/L 14(L) 15 15  ALT 0 - 44 U/L $Remo'9 10 9    'qpFOM$ 01/22/2019 [facility]: Tumor Mutational Burden     01/22/2019 PD-L1 Immunohistochemistry Analysis    01/22/2019 Soft Tissue Needle Core Biopsy Surgical Pathology     RADIOGRAPHIC STUDIES: I have personally reviewed the radiological images as listed and agreed with the findings in the report. No results found.  ASSESSMENT & PLAN:   This is a 55 year old malewith  1. Metastatic Poorly differentiated lung adenocarcinoma Presented with Large neck mass, mediastinal mass, renal mass, and questionable bone lesions no brain mets on MRI brain 01/22/2019 PD-L1 Immunohistochemistry Analysis which revealed "Tumor Proportion Score (TPS) 50%" 05/16/2019 PET/CT ([id number]) revealed "Radiation changes in the right hemithorax, as above. Improving mediastinal nodal metastases. Prior bulky right cervical metastases have resolved. No evidence of metastatic disease in the abdomen/pelvis." 07/19/2019 Esophagus Scan ([id number]) revealed "Mild stricture at the C6-7 level likely related to prior esophageal surgery.  Barium tablet was slow to pass through this area but did pass through after 1 minutes. Hiatal hernia with moderate gastroesophageal reflux. Moderate stricture above the hiatal hernia likely due to reflux. Barium tablet did not pass this area. There are changes of esophagitis which are likely due to reflux and possibly radiation as well."  10/10/19 of  CT Chest W Contrast ([id number])- no evidence of lung cancer progression at this time.   2. 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 Symptomatic hemorrhoids  5. Normocytic anemia -Likely due to underlying malignancy  6.Thrombocytosis -- likely due to paraneoplastic effect of tumor and reactive due to inflammation and tissue inflammation from RT.-- now resolved.  7. Nicotine dependence -He was counseled about tobacco cessation. -has quit since cancer diagnosis.  PLAN:  -Discussed pt labwork today, 08/07/2020; mild anemia but holding stable, other counts normal. Chemistries normal. -Advised pt that he is in no evidence of disease status currently. -Recommended pt drink 48-64 oz of water daily. -The pt has no prohibitive toxicities from continuing C25 of Atezolizumab at this time.  -Will see back in 6 weeks with labs as scheduled.   FOLLOW UP: F/u as scheduled for next 2 cycles of treatment    The total time spent in the appointment was 20 minutes and more than 50% was on counseling and direct patient cares.  All of the patient's questions were answered with apparent satisfaction. The patient knows to call the clinic with any problems, questions or concerns.   Sullivan Lone MD [place] AAHIVMS [facility] Hematology/Oncology Physician [facility]  (Office):       [phone] (Work cell):  [phone] (Fax):           [phone]  08/07/2020 2:11 PM  I, Reinaldo Raddle, am acting as  scribe for Dr. Suzan Slick  Irene Limbo, MD.    .I have reviewed the above documentation for accuracy and completeness, and I agree with the above. Brunetta Genera MD

## 2020-08-07 ENCOUNTER — Other Ambulatory Visit: Payer: Medicaid Other

## 2020-08-07 ENCOUNTER — Ambulatory Visit: Payer: Medicaid Other

## 2020-08-07 ENCOUNTER — Inpatient Hospital Stay: Payer: Medicaid Other

## 2020-08-07 ENCOUNTER — Other Ambulatory Visit: Payer: Self-pay

## 2020-08-07 ENCOUNTER — Inpatient Hospital Stay (HOSPITAL_BASED_OUTPATIENT_CLINIC_OR_DEPARTMENT_OTHER): Payer: Medicaid Other | Admitting: Hematology

## 2020-08-07 VITALS — BP 99/71 | HR 90 | Temp 97.6°F | Resp 18 | Ht 71.0 in | Wt 135.3 lb

## 2020-08-07 DIAGNOSIS — Z95828 Presence of other vascular implants and grafts: Secondary | ICD-10-CM

## 2020-08-07 DIAGNOSIS — Z5112 Encounter for antineoplastic immunotherapy: Secondary | ICD-10-CM

## 2020-08-07 DIAGNOSIS — C3491 Malignant neoplasm of unspecified part of right bronchus or lung: Secondary | ICD-10-CM | POA: Diagnosis not present

## 2020-08-07 DIAGNOSIS — Z7189 Other specified counseling: Secondary | ICD-10-CM

## 2020-08-07 LAB — CBC WITH DIFFERENTIAL/PLATELET
Abs Immature Granulocytes: 0.01 10*3/uL (ref 0.00–0.07)
Basophils Absolute: 0 10*3/uL (ref 0.0–0.1)
Basophils Relative: 1 %
Eosinophils Absolute: 0 10*3/uL (ref 0.0–0.5)
Eosinophils Relative: 1 %
HCT: 35.9 % — ABNORMAL LOW (ref 39.0–52.0)
Hemoglobin: 10.8 g/dL — ABNORMAL LOW (ref 13.0–17.0)
Immature Granulocytes: 0 %
Lymphocytes Relative: 20 %
Lymphs Abs: 1 10*3/uL (ref 0.7–4.0)
MCH: 24 pg — ABNORMAL LOW (ref 26.0–34.0)
MCHC: 30.1 g/dL (ref 30.0–36.0)
MCV: 79.8 fL — ABNORMAL LOW (ref 80.0–100.0)
Monocytes Absolute: 0.6 10*3/uL (ref 0.1–1.0)
Monocytes Relative: 12 %
Neutro Abs: 3.2 10*3/uL (ref 1.7–7.7)
Neutrophils Relative %: 66 %
Platelets: 309 10*3/uL (ref 150–400)
RBC: 4.5 MIL/uL (ref 4.22–5.81)
RDW: 14.9 % (ref 11.5–15.5)
WBC: 4.8 10*3/uL (ref 4.0–10.5)
nRBC: 0 % (ref 0.0–0.2)

## 2020-08-07 LAB — CMP (CANCER CENTER ONLY)
ALT: 9 U/L (ref 0–44)
AST: 14 U/L — ABNORMAL LOW (ref 15–41)
Albumin: 4.1 g/dL (ref 3.5–5.0)
Alkaline Phosphatase: 105 U/L (ref 38–126)
Anion gap: 9 (ref 5–15)
BUN: 11 mg/dL (ref 6–20)
CO2: 27 mmol/L (ref 22–32)
Calcium: 9 mg/dL (ref 8.9–10.3)
Chloride: 106 mmol/L (ref 98–111)
Creatinine: 1.03 mg/dL (ref 0.61–1.24)
GFR, Estimated: >60 mL/min (ref >60)
Glucose, Bld: 120 mg/dL — ABNORMAL HIGH (ref 70–99)
Potassium: 3.9 mmol/L (ref 3.5–5.1)
Sodium: 142 mmol/L (ref 135–145)
Total Bilirubin: 0.3 mg/dL (ref 0.3–1.2)
Total Protein: 7.3 g/dL (ref 6.5–8.1)

## 2020-08-07 MED ORDER — SODIUM CHLORIDE 0.9% FLUSH
10.0000 mL | INTRAVENOUS | Status: DC | PRN
  Administered 2020-08-07: 10 mL
  Filled 2020-08-07: qty 10

## 2020-08-07 MED ORDER — DIPHENHYDRAMINE HCL 25 MG PO TABS
25.0000 mg | ORAL_TABLET | Freq: Once | ORAL | Status: AC
  Administered 2020-08-07: 25 mg via ORAL
  Filled 2020-08-07: qty 1

## 2020-08-07 MED ORDER — ACETAMINOPHEN 325 MG PO TABS
650.0000 mg | ORAL_TABLET | Freq: Once | ORAL | Status: AC
  Administered 2020-08-07: 650 mg via ORAL

## 2020-08-07 MED ORDER — SODIUM CHLORIDE 0.9 % IV SOLN
Freq: Once | INTRAVENOUS | Status: AC
  Filled 2020-08-07: qty 250

## 2020-08-07 MED ORDER — HEPARIN SOD (PORK) LOCK FLUSH 100 UNIT/ML IV SOLN
500.0000 [IU] | Freq: Once | INTRAVENOUS | Status: AC | PRN
  Administered 2020-08-07: 500 [IU]
  Filled 2020-08-07: qty 5

## 2020-08-07 MED ORDER — FAMOTIDINE 20 MG PO TABS
ORAL_TABLET | ORAL | Status: AC
  Filled 2020-08-07: qty 1

## 2020-08-07 MED ORDER — FAMOTIDINE 20 MG PO TABS
20.0000 mg | ORAL_TABLET | Freq: Once | ORAL | Status: AC
Start: 2020-08-07 — End: 2020-08-07
  Administered 2020-08-07: 20 mg via ORAL

## 2020-08-07 MED ORDER — SODIUM CHLORIDE 0.9 % IV SOLN
1200.0000 mg | Freq: Once | INTRAVENOUS | Status: AC
  Administered 2020-08-07: 1200 mg via INTRAVENOUS
  Filled 2020-08-07: qty 20

## 2020-08-07 MED ORDER — ACETAMINOPHEN 325 MG PO TABS
ORAL_TABLET | ORAL | Status: AC
  Filled 2020-08-07: qty 2

## 2020-08-07 MED ORDER — DIPHENHYDRAMINE HCL 25 MG PO CAPS
ORAL_CAPSULE | ORAL | Status: AC
  Filled 2020-08-07: qty 2

## 2020-08-07 NOTE — Patient Instructions (Signed)
[facility] Discharge Instructions for Patients Receiving Chemotherapy  Today you received the following chemotherapy agents: atezolizumab.  To help prevent nausea and vomiting after your treatment, we encourage you to take your nausea medication as directed.   If you develop nausea and vomiting that is not controlled by your nausea medication, call the clinic.   BELOW ARE SYMPTOMS THAT SHOULD BE REPORTED IMMEDIATELY:  *FEVER GREATER THAN 100.5 F  *CHILLS WITH OR WITHOUT FEVER  NAUSEA AND VOMITING THAT IS NOT CONTROLLED WITH YOUR NAUSEA MEDICATION  *UNUSUAL SHORTNESS OF BREATH  *UNUSUAL BRUISING OR BLEEDING  TENDERNESS IN MOUTH AND THROAT WITH OR WITHOUT PRESENCE OF ULCERS  *URINARY PROBLEMS  *BOWEL PROBLEMS  UNUSUAL RASH Items with * indicate a potential emergency and should be followed up as soon as possible.  Feel free to call the clinic should you have any questions or concerns. The clinic phone number is [phone].  Please show the [facility] at check-in to the Emergency Department and triage nurse.

## 2020-08-07 NOTE — Patient Instructions (Signed)
Implanted Port Insertion, Care After This sheet gives you information about how to care for yourself after your procedure. Your health care provider may also give you more specific instructions. If you have problems or questions, contact your health care provider. What can I expect after the procedure? After the procedure, it is common to have:  Discomfort at the port insertion site.  Bruising on the skin over the port. This should improve over 3-4 days. Follow these instructions at home: Port care  After your port is placed, you will get a manufacturer's information card. The card has information about your port. Keep this card with you at all times.  Take care of the port as told by your health care provider. Ask your health care provider if you or a family member can get training for taking care of the port at home. A home health care nurse may also take care of the port.  Make sure to remember what type of port you have. Incision care  Follow instructions from your health care provider about how to take care of your port insertion site. Make sure you: ? Wash your hands with soap and water before and after you change your bandage (dressing). If soap and water are not available, use hand sanitizer. ? Change your dressing as told by your health care provider. ? Leave stitches (sutures), skin glue, or adhesive strips in place. These skin closures may need to stay in place for 2 weeks or longer. If adhesive strip edges start to loosen and curl up, you may trim the loose edges. Do not remove adhesive strips completely unless your health care provider tells you to do that.  Check your port insertion site every day for signs of infection. Check for: ? Redness, swelling, or pain. ? Fluid or blood. ? Warmth. ? Pus or a bad smell.      Activity  Return to your normal activities as told by your health care provider. Ask your health care provider what activities are safe for you.  Do not  lift anything that is heavier than 10 lb (4.5 kg), or the limit that you are told, until your health care provider says that it is safe. General instructions  Take over-the-counter and prescription medicines only as told by your health care provider.  Do not take baths, swim, or use a hot tub until your health care provider approves. Ask your health care provider if you may take showers. You may only be allowed to take sponge baths.  Do not drive for 24 hours if you were given a sedative during your procedure.  Wear a medical alert bracelet in case of an emergency. This will tell any health care providers that you have a port.  Keep all follow-up visits as told by your health care provider. This is important. Contact a health care provider if:  You cannot flush your port with saline as directed, or you cannot draw blood from the port.  You have a fever or chills.  You have redness, swelling, or pain around your port insertion site.  You have fluid or blood coming from your port insertion site.  Your port insertion site feels warm to the touch.  You have pus or a bad smell coming from the port insertion site. Get help right away if:  You have chest pain or shortness of breath.  You have bleeding from your port that you cannot control. Summary  Take care of the port as told by your   health care provider. Keep the manufacturer's information card with you at all times.  Change your dressing as told by your health care provider.  Contact a health care provider if you have a fever or chills or if you have redness, swelling, or pain around your port insertion site.  Keep all follow-up visits as told by your health care provider. This information is not intended to replace advice given to you by your health care provider. Make sure you discuss any questions you have with your health care provider. Document Revised: 11/22/2017 Document Reviewed: 11/22/2017 Elsevier Patient Education   2021 Elsevier Inc.  

## 2020-08-27 ENCOUNTER — Other Ambulatory Visit: Payer: Medicaid Other

## 2020-08-27 ENCOUNTER — Inpatient Hospital Stay: Payer: Medicaid Other

## 2020-08-27 ENCOUNTER — Inpatient Hospital Stay: Payer: Medicaid Other | Attending: Hematology

## 2020-08-27 ENCOUNTER — Other Ambulatory Visit: Payer: Self-pay

## 2020-08-27 VITALS — BP 108/63 | HR 73 | Temp 97.7°F | Resp 18 | Wt 131.5 lb

## 2020-08-27 DIAGNOSIS — C3411 Malignant neoplasm of upper lobe, right bronchus or lung: Secondary | ICD-10-CM | POA: Insufficient documentation

## 2020-08-27 DIAGNOSIS — C3491 Malignant neoplasm of unspecified part of right bronchus or lung: Secondary | ICD-10-CM

## 2020-08-27 DIAGNOSIS — Z87891 Personal history of nicotine dependence: Secondary | ICD-10-CM | POA: Insufficient documentation

## 2020-08-27 DIAGNOSIS — Z5112 Encounter for antineoplastic immunotherapy: Secondary | ICD-10-CM | POA: Insufficient documentation

## 2020-08-27 DIAGNOSIS — Z79899 Other long term (current) drug therapy: Secondary | ICD-10-CM | POA: Insufficient documentation

## 2020-08-27 DIAGNOSIS — Z7189 Other specified counseling: Secondary | ICD-10-CM

## 2020-08-27 DIAGNOSIS — Z86718 Personal history of other venous thrombosis and embolism: Secondary | ICD-10-CM | POA: Diagnosis not present

## 2020-08-27 DIAGNOSIS — C778 Secondary and unspecified malignant neoplasm of lymph nodes of multiple regions: Secondary | ICD-10-CM | POA: Insufficient documentation

## 2020-08-27 DIAGNOSIS — Z7901 Long term (current) use of anticoagulants: Secondary | ICD-10-CM | POA: Diagnosis not present

## 2020-08-27 DIAGNOSIS — Z95828 Presence of other vascular implants and grafts: Secondary | ICD-10-CM

## 2020-08-27 LAB — CMP (CANCER CENTER ONLY)
ALT: 8 U/L (ref 0–44)
AST: 14 U/L — ABNORMAL LOW (ref 15–41)
Albumin: 3.8 g/dL (ref 3.5–5.0)
Alkaline Phosphatase: 105 U/L (ref 38–126)
Anion gap: 7 (ref 5–15)
BUN: 9 mg/dL (ref 6–20)
CO2: 26 mmol/L (ref 22–32)
Calcium: 9.1 mg/dL (ref 8.9–10.3)
Chloride: 107 mmol/L (ref 98–111)
Creatinine: 0.81 mg/dL (ref 0.61–1.24)
GFR, Estimated: >60 mL/min (ref >60)
Glucose, Bld: 91 mg/dL (ref 70–99)
Potassium: 4.1 mmol/L (ref 3.5–5.1)
Sodium: 140 mmol/L (ref 135–145)
Total Bilirubin: 0.3 mg/dL (ref 0.3–1.2)
Total Protein: 7 g/dL (ref 6.5–8.1)

## 2020-08-27 LAB — CBC WITH DIFFERENTIAL/PLATELET
Abs Immature Granulocytes: 0.01 10*3/uL (ref 0.00–0.07)
Basophils Absolute: 0 10*3/uL (ref 0.0–0.1)
Basophils Relative: 1 %
Eosinophils Absolute: 0 10*3/uL (ref 0.0–0.5)
Eosinophils Relative: 1 %
HCT: 35.5 % — ABNORMAL LOW (ref 39.0–52.0)
Hemoglobin: 10.8 g/dL — ABNORMAL LOW (ref 13.0–17.0)
Immature Granulocytes: 0 %
Lymphocytes Relative: 26 %
Lymphs Abs: 0.9 10*3/uL (ref 0.7–4.0)
MCH: 24.3 pg — ABNORMAL LOW (ref 26.0–34.0)
MCHC: 30.4 g/dL (ref 30.0–36.0)
MCV: 79.8 fL — ABNORMAL LOW (ref 80.0–100.0)
Monocytes Absolute: 0.6 10*3/uL (ref 0.1–1.0)
Monocytes Relative: 17 %
Neutro Abs: 1.9 10*3/uL (ref 1.7–7.7)
Neutrophils Relative %: 55 %
Platelets: 284 10*3/uL (ref 150–400)
RBC: 4.45 MIL/uL (ref 4.22–5.81)
RDW: 15.4 % (ref 11.5–15.5)
WBC: 3.3 10*3/uL — ABNORMAL LOW (ref 4.0–10.5)
nRBC: 0 % (ref 0.0–0.2)

## 2020-08-27 MED ORDER — SODIUM CHLORIDE 0.9% FLUSH
10.0000 mL | INTRAVENOUS | Status: DC | PRN
Start: 2020-08-27 — End: 2020-08-27
  Administered 2020-08-27: 10 mL
  Filled 2020-08-27: qty 10

## 2020-08-27 MED ORDER — SODIUM CHLORIDE 0.9 % IV SOLN
Freq: Once | INTRAVENOUS | Status: AC
  Filled 2020-08-27: qty 250

## 2020-08-27 MED ORDER — ACETAMINOPHEN 325 MG PO TABS
ORAL_TABLET | ORAL | Status: AC
  Filled 2020-08-27: qty 2

## 2020-08-27 MED ORDER — FAMOTIDINE 20 MG PO TABS
20.0000 mg | ORAL_TABLET | Freq: Once | ORAL | Status: AC
  Administered 2020-08-27: 20 mg via ORAL

## 2020-08-27 MED ORDER — SODIUM CHLORIDE 0.9% FLUSH
10.0000 mL | INTRAVENOUS | Status: DC | PRN
  Administered 2020-08-27: 10 mL
  Filled 2020-08-27: qty 10

## 2020-08-27 MED ORDER — DIPHENHYDRAMINE HCL 25 MG PO TABS
25.0000 mg | ORAL_TABLET | Freq: Once | ORAL | Status: AC
  Administered 2020-08-27: 25 mg via ORAL
  Filled 2020-08-27: qty 1

## 2020-08-27 MED ORDER — HEPARIN SOD (PORK) LOCK FLUSH 100 UNIT/ML IV SOLN
500.0000 [IU] | Freq: Once | INTRAVENOUS | Status: AC | PRN
  Administered 2020-08-27: 500 [IU]
  Filled 2020-08-27: qty 5

## 2020-08-27 MED ORDER — DIPHENHYDRAMINE HCL 25 MG PO CAPS
ORAL_CAPSULE | ORAL | Status: AC
  Filled 2020-08-27: qty 1

## 2020-08-27 MED ORDER — ACETAMINOPHEN 325 MG PO TABS
650.0000 mg | ORAL_TABLET | Freq: Once | ORAL | Status: AC
  Administered 2020-08-27: 650 mg via ORAL

## 2020-08-27 MED ORDER — SODIUM CHLORIDE 0.9 % IV SOLN
1200.0000 mg | Freq: Once | INTRAVENOUS | Status: AC
  Administered 2020-08-27: 1200 mg via INTRAVENOUS
  Filled 2020-08-27: qty 20

## 2020-08-27 MED ORDER — FAMOTIDINE 20 MG PO TABS
ORAL_TABLET | ORAL | Status: AC
  Filled 2020-08-27: qty 1

## 2020-08-27 NOTE — Patient Instructions (Signed)
Implanted Port Insertion, Care After This sheet gives you information about how to care for yourself after your procedure. Your health care provider may also give you more specific instructions. If you have problems or questions, contact your health care provider. What can I expect after the procedure? After the procedure, it is common to have:  Discomfort at the port insertion site.  Bruising on the skin over the port. This should improve over 3-4 days. Follow these instructions at home: Port care  After your port is placed, you will get a manufacturer's information card. The card has information about your port. Keep this card with you at all times.  Take care of the port as told by your health care provider. Ask your health care provider if you or a family member can get training for taking care of the port at home. A home health care nurse may also take care of the port.  Make sure to remember what type of port you have. Incision care  Follow instructions from your health care provider about how to take care of your port insertion site. Make sure you: ? Wash your hands with soap and water before and after you change your bandage (dressing). If soap and water are not available, use hand sanitizer. ? Change your dressing as told by your health care provider. ? Leave stitches (sutures), skin glue, or adhesive strips in place. These skin closures may need to stay in place for 2 weeks or longer. If adhesive strip edges start to loosen and curl up, you may trim the loose edges. Do not remove adhesive strips completely unless your health care provider tells you to do that.  Check your port insertion site every day for signs of infection. Check for: ? Redness, swelling, or pain. ? Fluid or blood. ? Warmth. ? Pus or a bad smell.      Activity  Return to your normal activities as told by your health care provider. Ask your health care provider what activities are safe for you.  Do not  lift anything that is heavier than 10 lb (4.5 kg), or the limit that you are told, until your health care provider says that it is safe. General instructions  Take over-the-counter and prescription medicines only as told by your health care provider.  Do not take baths, swim, or use a hot tub until your health care provider approves. Ask your health care provider if you may take showers. You may only be allowed to take sponge baths.  Do not drive for 24 hours if you were given a sedative during your procedure.  Wear a medical alert bracelet in case of an emergency. This will tell any health care providers that you have a port.  Keep all follow-up visits as told by your health care provider. This is important. Contact a health care provider if:  You cannot flush your port with saline as directed, or you cannot draw blood from the port.  You have a fever or chills.  You have redness, swelling, or pain around your port insertion site.  You have fluid or blood coming from your port insertion site.  Your port insertion site feels warm to the touch.  You have pus or a bad smell coming from the port insertion site. Get help right away if:  You have chest pain or shortness of breath.  You have bleeding from your port that you cannot control. Summary  Take care of the port as told by your   health care provider. Keep the manufacturer's information card with you at all times.  Change your dressing as told by your health care provider.  Contact a health care provider if you have a fever or chills or if you have redness, swelling, or pain around your port insertion site.  Keep all follow-up visits as told by your health care provider. This information is not intended to replace advice given to you by your health care provider. Make sure you discuss any questions you have with your health care provider. Document Revised: 11/22/2017 Document Reviewed: 11/22/2017 Elsevier Patient Education   2021 Elsevier Inc.  

## 2020-08-27 NOTE — Patient Instructions (Signed)
[facility] Discharge Instructions for Patients Receiving Chemotherapy  Today you received the following chemotherapy agents: atezolizumab.  To help prevent nausea and vomiting after your treatment, we encourage you to take your nausea medication as directed.   If you develop nausea and vomiting that is not controlled by your nausea medication, call the clinic.   BELOW ARE SYMPTOMS THAT SHOULD BE REPORTED IMMEDIATELY:  *FEVER GREATER THAN 100.5 F  *CHILLS WITH OR WITHOUT FEVER  NAUSEA AND VOMITING THAT IS NOT CONTROLLED WITH YOUR NAUSEA MEDICATION  *UNUSUAL SHORTNESS OF BREATH  *UNUSUAL BRUISING OR BLEEDING  TENDERNESS IN MOUTH AND THROAT WITH OR WITHOUT PRESENCE OF ULCERS  *URINARY PROBLEMS  *BOWEL PROBLEMS  UNUSUAL RASH Items with * indicate a potential emergency and should be followed up as soon as possible.  Feel free to call the clinic should you have any questions or concerns. The clinic phone number is [phone].  Please show the [facility] at check-in to the Emergency Department and triage nurse.

## 2020-08-28 ENCOUNTER — Ambulatory Visit: Payer: Medicaid Other

## 2020-08-28 ENCOUNTER — Other Ambulatory Visit: Payer: Medicaid Other

## 2020-09-16 NOTE — Progress Notes (Signed)
HEMATOLOGY/ONCOLOGY CLINIC NOTE  Date of Service: 09/17/2020  Patient Care Team: [name], MD as PCP - General (Family Medicine)   CHIEF COMPLAINTS/PURPOSE OF CONSULTATION:  contnued management of Metastatic Poorly differentiated lung adenocarcinoma  HISTORY OF PRESENTING ILLNESS:   David Berry is a 55 year old male with no significant medical history.  He presented to the hospital with progressive right arm and chest pain with new right-sided neck swelling.  The patient states that he was diagnosed with carpal tunnel syndrome received an injection in August.  However, he continued to have progressive pain up and down his right arm that would also radiate to his right chest.  He took ibuprofen with no improvement.  1 week prior to admission, he developed new right sided neck swelling.  He presented to the emergency room for further evaluation.  Work-up in the emergency room was significant for a CT angiogram of the neck and chest which revealed a bulky soft tissue mass in the right neck continuing into the upper chest most compatible with extensive metastatic lymphadenopathy.  This mass measures up to 34 mm and encases multiple vessels including the right upper lobe pulmonary vessels, superior vena cava, and brachiocephalic vessel.  He also had a large poorly defined malignant appearing infiltrative mass measuring 6.2 x 5.6 x 7.9 cm within the right anterior and middle mediastinum with suspected invasion of the distal left brachiocephalic vein and probable tumor occlusion of the right subclavian and jugular veins.  There is also suspected tumor invasion of the upper SVC with string-like narrowing of the SVC but with patency of the SVC just above the right atrium.  The tumor abuts the aorta and great vessels and also results in significant narrowing of the right upper lobe pulmonary arterial vessels.  He also had a CT of the abdomen pelvis which showed a 15 mm heterogeneously enhancing  lesion in the upper pole of the right kidney concerning for renal cell carcinoma, no lymphadenopathy in the abdomen or pelvis, focal hyperenhancement of the anterior left liver most likely related to aberrant venous anatomy/flow, sclerotic lesions in the right acetabulum and left iliac bone are indeterminate.  These are likely bone islands but attention on follow-up is recommended a metastatic disease cannot be excluded.  MRI of the brain with and without contrast did not show any evidence of intracranial metastatic disease. The patient underwent a CT-guided biopsy in interventional radiology on 01/22/2019.  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   When seen today, patient reports ongoing pain and swelling to his right arm and right side of his neck.  Reports ongoing pain to his right chest.  He reports intermittent facial swelling which is worse in the morning and improves throughout the day.  He reports anorexia and a weight loss of 20 pounds over the past few weeks. He has had dizziness for the past few months.  Denies headaches.  He also noted that on the day of admission his peripheral vision was poor.  His vision is now improved.  He denies food getting stuck or difficulty swallowing but does feel as though his throat is "tight."  He reports a nonproductive cough.  Denies fevers and chills.  He is not really having any shortness of breath.  Denies abdominal pain, nausea, vomiting, constipation, diarrhea.  He has not noticed any epistaxis, hemoptysis, hematemesis, hematuria, melena, hematochezia.  The patient is single.  He has 1  daughter who lives in [place], [place].  Reports occasional alcohol use and currently smokes 3 cigars a day since 1997.  He is currently living in a rooming house with other roommates who all have their own room.  They share a common bathroom and kitchen.  Medical oncology was asked see  the patient to make recommendations regarding his newly diagnosed poorly differentiated carcinoma.  History reviewed. No pertinent past medical history.  Current Treatment:  Atezolizumab maintenance   INTERVAL HISTORY:   David Berry. is a 55 y.o. male who is here for evaluation and management of metastatic poorly differentiated lung adenocarcinoma. He is here for C25 of Atezolizumab. The patient's last visit with [place] was on 08/07/2020. The pt reports that he is doing well overall.  The pt reports that he still experiences dizziness when bending and squatting down. The pt notes that he has been losing some weight, denying he is more busy and active. The pt notes he works three nights now and this does keep him moving. This is also new for him and could be the reason for the weight loss. The pt notes the dizziness he experiences is lightheadedness, but not spinning feeling. The pt notes he has not been eating as much due to his busy schedule. The pt notes some problems with maintaining an erection. He has mentioned this to his PCP but was dealing with lower BP at the time.  Lab results today 09/17/2020 of CBC w/diff and CMP is as follows: all values are WNL except for Hgb of 10.4, HCT of 34.3, MCV of 79.4, MCH of 24.1.  On review of systems, pt reports dizziness, weight loss, decreased food intake, difficulty maintaining erection and denies changes in bowel habits, tingling/numbness in hands/feet, fevers, chills, leg swelling, and any other symptoms.  MEDICAL HISTORY:  Past Medical History:  Diagnosis Date  . Anxiety   . Bipolar disorder ([place])   . Blood in stool   . Cancer ([place])   . 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 GERD (gastroesophageal reflux disease)   . Lung cancer ([place]) dx'd 01/2019  . Peripheral vascular disease ([place])    blood clot in neck Sept 12, 2020    SURGICAL HISTORY: Past Surgical History:  Procedure Laterality Date  . APPENDECTOMY      teenager  . INGUINAL HERNIA REPAIR Right 2016, 2017   [facility], 2018 [facility] mesh  . IR IMAGING GUIDED PORT INSERTION  04/26/2019  . TOOTH EXTRACTION N/A 03/14/2020   Procedure: DENTAL RESTORATION/EXTRACTIONS;  Surgeon: Diona Browner, DDS;  Location: [place];  Service: Oral Surgery;  Laterality: N/A;    SOCIAL HISTORY: Social History   Socioeconomic History  . Marital status: Single    Spouse name: Not on file  . Number of children: 1  . Years of education: GED  . Highest education level: Not on file  Occupational History    Comment: fork lift driver  Tobacco Use  . Smoking status: Former Smoker    Types: Cigars    Quit date: 01/20/2019    Years since quitting: 1.6  . Smokeless tobacco: Never Used  . Tobacco comment: smokes 3 black and mild cigars per day  Substance and Sexual Activity  . Alcohol use: Not Currently  . Drug use: No  . Sexual activity: Yes  Other Topics Concern  . Not on file  Social History Narrative   Lives alone   Caffeine- coffee, 20 oz  daily, tea occas   Social Determinants of Health   Financial Resource Strain: Not on file  Food Insecurity: Not on file  Transportation Needs: Not on file  Physical Activity: Not on file  Stress: Not on file  Social Connections: Not on file  Intimate Partner Violence: Not on file    FAMILY HISTORY: Family History  Problem Relation Age of Onset  . Prostate cancer Father     ALLERGIES:  is allergic to pregabalin.  MEDICATIONS:  Current Outpatient Medications  Medication Sig Dispense Refill  . amoxicillin (AMOXIL) 500 MG capsule Take 1 capsule (500 mg total) by mouth 3 (three) times daily. (Patient not taking: Reported on 04/02/2020) 21 capsule 0  . apixaban (ELIQUIS) 5 MG TABS tablet Take 1 tablet (5 mg total) by mouth 2 (two) times daily. 60 tablet 11  . esomeprazole (NEXIUM) 40 MG capsule TAKE 1 CAPSULE(40 MG) BY MOUTH DAILY 30 capsule 1  . LORazepam (ATIVAN) 0.5 MG tablet Take 1 tablet (0.5  mg total) by mouth every 8 (eight) hours as needed for anxiety. 60 tablet 0  . oxyCODONE-acetaminophen (PERCOCET) 5-325 MG tablet Take 1 tablet by mouth every 4 (four) hours as needed. (Patient not taking: Reported on 04/02/2020) 30 tablet 0   No current facility-administered medications for this visit.   REVIEW OF SYSTEMS:   10 Point review of Systems was done is negative except as noted above.  PHYSICAL EXAMINATION: ECOG FS:1 - Symptomatic but completely ambulatory  Vitals:   09/17/20 0935  BP: 136/68  Pulse: 83  Resp: 17  Temp: 98 F (36.7 C)  SpO2: 100%   Wt Readings from Last 3 Encounters:  09/17/20 131 lb 6.4 oz (59.6 kg)  08/27/20 131 lb 8 oz (59.6 kg)  08/07/20 135 lb 4.8 oz (61.4 kg)   Body mass index is 18.33 kg/m.    GENERAL:alert, in no acute distress and comfortable SKIN: no acute rashes, no significant lesions EYES: conjunctiva are pink and non-injected, sclera anicteric OROPHARYNX: MMM, no exudates, no oropharyngeal erythema or ulceration NECK: supple, no JVD LYMPH:  no palpable lymphadenopathy in the cervical, axillary or inguinal regions LUNGS: clear to auscultation b/l with normal respiratory effort HEART: regular rate & rhythm ABDOMEN:  normoactive bowel sounds , non tender, not distended. Extremity: no pedal edema PSYCH: alert & oriented x 3 with fluent speech NEURO: no focal motor/sensory deficits   LABORATORY DATA:  I have reviewed the data as listed  . CBC Latest Ref Rng & Units 09/17/2020 08/27/2020 08/07/2020  WBC 4.0 - 10.5 K/uL 4.1 3.3(L) 4.8  Hemoglobin 13.0 - 17.0 g/dL 10.4(L) 10.8(L) 10.8(L)  Hematocrit 39.0 - 52.0 % 34.3(L) 35.5(L) 35.9(L)  Platelets 150 - 400 K/uL 268 284 309    . CMP Latest Ref Rng & Units 09/17/2020 08/27/2020 08/07/2020  Glucose 70 - 99 mg/dL 98 91 120(H)  BUN 6 - 20 mg/dL _0 Creatinine 0.61 - 1.24 mg/dL 0.83 0.81 1.03  Sodium 135 - 145 mmol/L 139 140 142  Potassium 3.5 - 5.1 mmol/L 3.8 4.1 3.9  Chloride 98  - 111 mmol/L 105 107 106  CO2 22 - 32 mmol/L _1 Calcium 8.9 - 10.3 mg/dL 9.5 9.1 9.0  Total Protein 6.5 - 8.1 g/dL 6.9 7.0 7.3  Total Bilirubin 0.3 - 1.2 mg/dL 0.3 0.3 0.3  Alkaline Phos 38 - 126 U/L 89 105 105  AST 15 - 41 U/L 18 14(L) 14(L)  ALT 0 - 44 U/L 8  8 9    01/22/2019 [facility]: Tumor Mutational Burden     01/22/2019 PD-L1 Immunohistochemistry Analysis    01/22/2019 Soft Tissue Needle Core Biopsy Surgical Pathology     RADIOGRAPHIC STUDIES: I have personally reviewed the radiological images as listed and agreed with the findings in the report. No results found.  ASSESSMENT & PLAN:   This is a 55 year old malewith  1. Metastatic Poorly differentiated lung adenocarcinoma Presented with Large neck mass, mediastinal mass, renal mass, and questionable bone lesions no brain mets on MRI brain 01/22/2019 PD-L1 Immunohistochemistry Analysis which revealed "Tumor Proportion Score (TPS) 50%" 05/16/2019 PET/CT ([id number]) revealed "Radiation changes in the right hemithorax, as above. Improving mediastinal nodal metastases. Prior bulky right cervical metastases have resolved. No evidence of metastatic disease in the abdomen/pelvis." 07/19/2019 Esophagus Scan ([id number]) revealed "Mild stricture at the C6-7 level likely related to prior esophageal surgery. Barium tablet was slow to pass through this area but did pass through after 1 minutes. Hiatal hernia with moderate gastroesophageal reflux. Moderate stricture above the hiatal hernia likely due to reflux. Barium tablet did not pass this area. There are changes of esophagitis which are likely due to reflux and possibly radiation as well."  10/10/19 of  CT Chest W Contrast ([id number])- no evidence of lung cancer progression at this time.   2. 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 Symptomatic hemorrhoids  5. Normocytic anemia -Likely due to underlying malignancy  6.Thrombocytosis -- likely due to paraneoplastic effect of tumor and reactive due to inflammation and tissue inflammation from RT.-- now resolved.  7. Nicotine dependence -He was counseled about tobacco cessation. -has quit since cancer diagnosis.  PLAN:  -Discussed pt labwork today, 09/17/2020; blood counts stable, chemistries normal. -Advised pt his dizziness is most likely orthostasis due to dehydration. -Recommended pt increase salt intake for the next two weeks and continue to increase his caloric and food intake. -Recommended pt f/u w PCP regarding starting Viagra. -Recommended pt use stool softeners to maintain consistent bowel habits if needed. -Advised pt his last scans were in [place]. Will get repeat scans in around July/August. -Advised pt that he is in no evidence of disease status currently. -Recommended pt drink 48-64 oz of water daily. -The pt has no prohibitive toxicities from continuing Atezolizumab at this time.  -Will see back in 6 weeks with labs.   FOLLOW UP: Plz schedule next 4 - Atezolizumab treatments with portflush and labs  MD visit every other treatment. IF MD visit is due during [facility] plz schedule that visit with Murray Hodgkins.   The total time spent in the appointment was 20 minutes and more than 50% was on counseling and direct patient cares.  All of the patient's questions were answered with apparent satisfaction. The patient knows to call the clinic with any problems, questions or concerns.   Sullivan Lone MD [place] AAHIVMS [facility] Hematology/Oncology [facility]  (Office):       [phone] (Work cell):  [phone] (Fax):           [phone]  09/17/2020 10:17 AM  I, Reinaldo Raddle, am acting as scribe for Dr. Sullivan Lone, MD. .I have reviewed the above documentation for accuracy and completeness, and I agree with the  above. Brunetta Genera MD

## 2020-09-17 ENCOUNTER — Inpatient Hospital Stay: Payer: Medicaid Other | Attending: Hematology | Admitting: Hematology

## 2020-09-17 ENCOUNTER — Inpatient Hospital Stay: Payer: Medicaid Other

## 2020-09-17 ENCOUNTER — Other Ambulatory Visit: Payer: Medicaid Other

## 2020-09-17 ENCOUNTER — Ambulatory Visit: Payer: Medicaid Other | Admitting: Hematology

## 2020-09-17 ENCOUNTER — Other Ambulatory Visit: Payer: Self-pay

## 2020-09-17 ENCOUNTER — Ambulatory Visit: Payer: Medicaid Other

## 2020-09-17 VITALS — BP 136/68 | HR 83 | Temp 98.0°F | Resp 17 | Ht 71.0 in | Wt 131.4 lb

## 2020-09-17 DIAGNOSIS — Z5112 Encounter for antineoplastic immunotherapy: Secondary | ICD-10-CM | POA: Insufficient documentation

## 2020-09-17 DIAGNOSIS — Z8042 Family history of malignant neoplasm of prostate: Secondary | ICD-10-CM | POA: Diagnosis not present

## 2020-09-17 DIAGNOSIS — C3491 Malignant neoplasm of unspecified part of right bronchus or lung: Secondary | ICD-10-CM | POA: Diagnosis not present

## 2020-09-17 DIAGNOSIS — Z95828 Presence of other vascular implants and grafts: Secondary | ICD-10-CM

## 2020-09-17 DIAGNOSIS — Z79899 Other long term (current) drug therapy: Secondary | ICD-10-CM | POA: Insufficient documentation

## 2020-09-17 DIAGNOSIS — C3411 Malignant neoplasm of upper lobe, right bronchus or lung: Secondary | ICD-10-CM | POA: Insufficient documentation

## 2020-09-17 DIAGNOSIS — Z7189 Other specified counseling: Secondary | ICD-10-CM

## 2020-09-17 DIAGNOSIS — C771 Secondary and unspecified malignant neoplasm of intrathoracic lymph nodes: Secondary | ICD-10-CM | POA: Insufficient documentation

## 2020-09-17 DIAGNOSIS — Z87891 Personal history of nicotine dependence: Secondary | ICD-10-CM | POA: Diagnosis not present

## 2020-09-17 LAB — CBC WITH DIFFERENTIAL/PLATELET
Abs Immature Granulocytes: 0.01 10*3/uL (ref 0.00–0.07)
Basophils Absolute: 0 10*3/uL (ref 0.0–0.1)
Basophils Relative: 0 %
Eosinophils Absolute: 0.1 10*3/uL (ref 0.0–0.5)
Eosinophils Relative: 2 %
HCT: 34.3 % — ABNORMAL LOW (ref 39.0–52.0)
Hemoglobin: 10.4 g/dL — ABNORMAL LOW (ref 13.0–17.0)
Immature Granulocytes: 0 %
Lymphocytes Relative: 18 %
Lymphs Abs: 0.7 10*3/uL (ref 0.7–4.0)
MCH: 24.1 pg — ABNORMAL LOW (ref 26.0–34.0)
MCHC: 30.3 g/dL (ref 30.0–36.0)
MCV: 79.4 fL — ABNORMAL LOW (ref 80.0–100.0)
Monocytes Absolute: 0.7 10*3/uL (ref 0.1–1.0)
Monocytes Relative: 17 %
Neutro Abs: 2.6 10*3/uL (ref 1.7–7.7)
Neutrophils Relative %: 63 %
Platelets: 268 10*3/uL (ref 150–400)
RBC: 4.32 MIL/uL (ref 4.22–5.81)
RDW: 15 % (ref 11.5–15.5)
WBC: 4.1 10*3/uL (ref 4.0–10.5)
nRBC: 0 % (ref 0.0–0.2)

## 2020-09-17 LAB — CMP (CANCER CENTER ONLY)
ALT: 8 U/L (ref 0–44)
AST: 18 U/L (ref 15–41)
Albumin: 3.7 g/dL (ref 3.5–5.0)
Alkaline Phosphatase: 89 U/L (ref 38–126)
Anion gap: 8 (ref 5–15)
BUN: 9 mg/dL (ref 6–20)
CO2: 26 mmol/L (ref 22–32)
Calcium: 9.5 mg/dL (ref 8.9–10.3)
Chloride: 105 mmol/L (ref 98–111)
Creatinine: 0.83 mg/dL (ref 0.61–1.24)
GFR, Estimated: >60 mL/min (ref >60)
Glucose, Bld: 98 mg/dL (ref 70–99)
Potassium: 3.8 mmol/L (ref 3.5–5.1)
Sodium: 139 mmol/L (ref 135–145)
Total Bilirubin: 0.3 mg/dL (ref 0.3–1.2)
Total Protein: 6.9 g/dL (ref 6.5–8.1)

## 2020-09-17 MED ORDER — DIPHENHYDRAMINE HCL 25 MG PO CAPS
ORAL_CAPSULE | ORAL | Status: AC
  Filled 2020-09-17: qty 2

## 2020-09-17 MED ORDER — SODIUM CHLORIDE 0.9% FLUSH
10.0000 mL | INTRAVENOUS | Status: DC | PRN
  Administered 2020-09-17: 10 mL
  Filled 2020-09-17: qty 10

## 2020-09-17 MED ORDER — SODIUM CHLORIDE 0.9 % IV SOLN
1200.0000 mg | Freq: Once | INTRAVENOUS | Status: AC
  Administered 2020-09-17: 1200 mg via INTRAVENOUS
  Filled 2020-09-17: qty 20

## 2020-09-17 MED ORDER — FAMOTIDINE 20 MG PO TABS
20.0000 mg | ORAL_TABLET | Freq: Once | ORAL | Status: AC
  Administered 2020-09-17: 20 mg via ORAL

## 2020-09-17 MED ORDER — DIPHENHYDRAMINE HCL 25 MG PO TABS
25.0000 mg | ORAL_TABLET | Freq: Once | ORAL | Status: AC
  Administered 2020-09-17: 25 mg via ORAL
  Filled 2020-09-17: qty 1

## 2020-09-17 MED ORDER — SODIUM CHLORIDE 0.9 % IV SOLN
Freq: Once | INTRAVENOUS | Status: AC
  Filled 2020-09-17: qty 250

## 2020-09-17 MED ORDER — ACETAMINOPHEN 325 MG PO TABS
650.0000 mg | ORAL_TABLET | Freq: Once | ORAL | Status: AC
  Administered 2020-09-17: 650 mg via ORAL

## 2020-09-17 MED ORDER — FAMOTIDINE 20 MG PO TABS
ORAL_TABLET | ORAL | Status: AC
  Filled 2020-09-17: qty 1

## 2020-09-17 MED ORDER — ACETAMINOPHEN 325 MG PO TABS
ORAL_TABLET | ORAL | Status: AC
  Filled 2020-09-17: qty 2

## 2020-09-22 ENCOUNTER — Telehealth: Payer: Self-pay | Admitting: Hematology

## 2020-09-22 NOTE — Telephone Encounter (Signed)
Scheduled follow-up appointments per 5/11 los. Patient is aware. ?

## 2020-09-26 ENCOUNTER — Ambulatory Visit (INDEPENDENT_AMBULATORY_CARE_PROVIDER_SITE_OTHER): Payer: Medicaid Other | Admitting: Family Medicine

## 2020-09-26 ENCOUNTER — Other Ambulatory Visit: Payer: Self-pay

## 2020-09-26 ENCOUNTER — Encounter: Payer: Self-pay | Admitting: Family Medicine

## 2020-09-26 VITALS — BP 100/78 | HR 84 | Temp 98.0°F | Ht 71.0 in | Wt 131.6 lb

## 2020-09-26 DIAGNOSIS — Z1159 Encounter for screening for other viral diseases: Secondary | ICD-10-CM | POA: Diagnosis not present

## 2020-09-26 DIAGNOSIS — Z125 Encounter for screening for malignant neoplasm of prostate: Secondary | ICD-10-CM

## 2020-09-26 DIAGNOSIS — E861 Hypovolemia: Secondary | ICD-10-CM | POA: Diagnosis not present

## 2020-09-26 DIAGNOSIS — C3491 Malignant neoplasm of unspecified part of right bronchus or lung: Secondary | ICD-10-CM

## 2020-09-26 DIAGNOSIS — K219 Gastro-esophageal reflux disease without esophagitis: Secondary | ICD-10-CM | POA: Diagnosis not present

## 2020-09-26 DIAGNOSIS — H6122 Impacted cerumen, left ear: Secondary | ICD-10-CM | POA: Diagnosis not present

## 2020-09-26 DIAGNOSIS — I9589 Other hypotension: Secondary | ICD-10-CM

## 2020-09-26 DIAGNOSIS — R1032 Left lower quadrant pain: Secondary | ICD-10-CM | POA: Diagnosis not present

## 2020-09-26 DIAGNOSIS — Z0000 Encounter for general adult medical examination without abnormal findings: Secondary | ICD-10-CM | POA: Diagnosis not present

## 2020-09-26 DIAGNOSIS — Z13220 Encounter for screening for lipoid disorders: Secondary | ICD-10-CM

## 2020-09-26 LAB — LIPID PANEL
Cholesterol: 199 mg/dL (ref 0–200)
HDL: 81.6 mg/dL (ref >39.00)
LDL Cholesterol: 109 mg/dL — ABNORMAL HIGH (ref 0–99)
NonHDL: 117.61
Total CHOL/HDL Ratio: 2
Triglycerides: 43 mg/dL (ref 0.0–149.0)
VLDL: 8.6 mg/dL (ref 0.0–40.0)

## 2020-09-26 LAB — PSA: PSA: 0.77 ng/mL (ref 0.10–4.00)

## 2020-09-26 NOTE — Progress Notes (Signed)
Subjective:     David Berry. is a 55 y.o. male and is here for a comprehensive physical exam. May drink one bottle of water q 3 days.  Notes dizziness when leans over at his part time job or gets up too quickly.  bp low at recent visit with specialist.  Pt states drinking water causes acid reflux/pain in chest.  Cannot drink cold water.  Now able to eat pizza since taking nexium.  Also notes sharp pain in left groin/left lower side of stomach similar to when he had a right inguinal hernia.  Patient denies heavy lifting, pushing pulling edema, or bulging in the groin or scrotum.  Patient followed by oncology for history of adenocarcinoma of right lung.  Patient inquires if he really has lung cancer as he feels okay and does not have any issues breathing.  Social History   Socioeconomic History  . Marital status: Single    Spouse name: Not on file  . Number of children: 1  . Years of education: GED  . Highest education level: Not on file  Occupational History    Comment: fork lift driver  Tobacco Use  . Smoking status: Former Smoker    Types: Cigars    Quit date: 01/20/2019    Years since quitting: 1.6  . Smokeless tobacco: Never Used  . Tobacco comment: smokes 3 black and mild cigars per day  Substance and Sexual Activity  . Alcohol use: Not Currently  . Drug use: No  . Sexual activity: Yes  Other Topics Concern  . Not on file  Social History Narrative   Lives alone   Caffeine- coffee, 20 oz daily, tea occas   Social Determinants of Health   Financial Resource Strain: Not on file  Food Insecurity: Not on file  Transportation Needs: Not on file  Physical Activity: Not on file  Stress: Not on file  Social Connections: Not on file  Intimate Partner Violence: Not on file   Health Maintenance  Topic Date Due  . COVID-19 Vaccine (1) Never done  . Hepatitis C Screening  Never done  . TETANUS/TDAP  Never done  . INFLUENZA VACCINE  12/08/2020  . COLONOSCOPY (Pts 45-62yrs  Insurance coverage will need to be confirmed)  07/08/2024  . HIV Screening  Completed  . HPV VACCINES  Aged Out    The following portions of the patient's history were reviewed and updated as appropriate: allergies, current medications, past family history, past medical history, past social history, past surgical history and problem list.  Review of Systems Pertinent items noted in HPI and remainder of comprehensive ROS otherwise negative.   Objective:    BP 100/78 (BP Location: Left Arm, Patient Position: Sitting, Cuff Size: Normal)   Pulse 84   Temp 98 F (36.7 C) (Oral)   Ht 5\' 11"  (1.803 m)   Wt 131 lb 9.6 oz (59.7 kg)   SpO2 (!) 84%   BMI 18.35 kg/m  General appearance: alert, cooperative and no distress Head: Normocephalic, without obvious abnormality, atraumatic Eyes: conjunctivae/corneas clear. PERRL, EOM's intact. Fundi benign. Ears: Normal external ear, canal, and TM of R ear.  L ear normal externally.  Canal occluded with cerumen.  TM remained partially occluded, but normal after irrigation and use of currette. Nose: Nares normal. Septum midline. Mucosa normal. No drainage or sinus tenderness. Throat: lips, mucosa, and tongue normal; teeth and gums normal Neck: no adenopathy, no carotid bruit, no JVD, supple, symmetrical, trachea midline and thyroid not  enlarged, symmetric, no tenderness/mass/nodules Lungs: clear to auscultation bilaterally Heart: regular rate and rhythm, S1, S2 normal, no murmur, click, rub or gallop Abdomen: BS present, soft, TTP in LLQ, non distended, no hepatospleenomegaly.  no inguinal mass Extremities: extremities normal, atraumatic, no cyanosis or edema Pulses: 2+ and symmetric Skin: Skin color, texture, turgor normal. No rashes or lesions Lymph nodes: Cervical, supraclavicular, and axillary nodes normal. Neurologic: Alert and oriented X 3, normal strength and tone. Normal symmetric reflexes. Normal coordination and gait    Assessment:     Healthy male exam with cerumen impaction in L ear, increased GERD,    Plan:     Anticipatory guidance given including wearing seatbelts, smoke detectors in the home, increasing physical activity, increasing p.o. intake of water and vegetables. -will obtain labs.  CBC and CMP reviewed from 09/17/20 -colonoscopy up to date See After Visit Summary for Counseling Recommendations    Left inguinal pain - Plan: US Abdomen Limited  Impacted cerumen of left ear -Consent obtained.  Left ear irrigated.  Patient tolerated procedure well. -Consider Debrox eardrops OTC  Gastroesophageal reflux disease, unspecified whether esophagitis present -Continue Nexium -Avoid foods known to cause problems -Given with liquids and solids will place referral to GI for EGD -Plan: Ambulatory referral to Gastroenterology  Screening for prostate cancer - Plan: PSA  Encounter for hepatitis C screening test for low risk patient  - Plan: Hep C Antibody  Screening for cholesterol level  - Plan: Lipid panel  Hypotension due to hypovolemia -Symptomatic -BP 100/78 -Orthostatics -Patient encouraged to increase p.o. intake of water and fluids -We will obtain labs -Continue to monitor  Adenocarcinoma of right lung ([place]) -As patient is in denial about diagnosis, pt showed CT images of mass in right lung -Continue smoking cessation -Continue follow-up with oncology, Dr. Irene Limbo  F/u prn  Grier Mitts, MD

## 2020-09-26 NOTE — Patient Instructions (Addendum)
Preventive Care 26-54 Years Old, Male Preventive care refers to lifestyle choices and visits with your health care provider that can promote health and wellness. This includes:  A yearly physical exam. This is also called an annual wellness visit.  Regular dental and eye exams.  Immunizations.  Screening for certain conditions.  Healthy lifestyle choices, such as: ? Eating a healthy diet. ? Getting regular exercise. ? Not using drugs or products that contain nicotine and tobacco. ? Limiting alcohol use. What can I expect for my preventive care visit? Physical exam Your health care provider will check your:  Height and weight. These may be used to calculate your BMI (body mass index). BMI is a measurement that tells if you are at a healthy weight.  Heart rate and blood pressure.  Body temperature.  Skin for abnormal spots. Counseling Your health care provider may ask you questions about your:  Past medical problems.  Family's medical history.  Alcohol, tobacco, and drug use.  Emotional well-being.  Home life and relationship well-being.  Sexual activity.  Diet, exercise, and sleep habits.  Work and work Statistician.  Access to firearms. What immunizations do I need? Vaccines are usually given at various ages, according to a schedule. Your health care provider will recommend vaccines for you based on your age, medical history, and lifestyle or other factors, such as travel or where you work.   What tests do I need? Blood tests  Lipid and cholesterol levels. These may be checked every 5 years, or more often if you are over 43 years old.  Hepatitis C test.  Hepatitis B test. Screening  Lung cancer screening. You may have this screening every year starting at age 16 if you have a 30-pack-year history of smoking and currently smoke or have quit within the past 15 years.  Prostate cancer screening. Recommendations will vary depending on your family history and  other risks.  Genital exam to check for testicular cancer or hernias.  Colorectal cancer screening. ? All adults should have this screening starting at age 47 and continuing until age 37. ? Your health care provider may recommend screening at age 66 if you are at increased risk. ? You will have tests every 1-10 years, depending on your results and the type of screening test.  Diabetes screening. ? This is done by checking your blood sugar (glucose) after you have not eaten for a while (fasting). ? You may have this done every 1-3 years.  STD (sexually transmitted disease) testing, if you are at risk. Follow these instructions at home: Eating and drinking  Eat a diet that includes fresh fruits and vegetables, whole grains, lean protein, and low-fat dairy products.  Take vitamin and mineral supplements as recommended by your health care provider.  Do not drink alcohol if your health care provider tells you not to drink.  If you drink alcohol: ? Limit how much you have to 0-2 drinks a day. ? Be aware of how much alcohol is in your drink. In the [place] one drink equals one 12 oz bottle of beer (355 mL), one 5 oz glass of wine (148 mL), or one 1 oz glass of hard liquor (44 mL).   Lifestyle  Take daily care of your teeth and gums. Brush your teeth every morning and night with fluoride toothpaste. Floss one time each day.  Stay active. Exercise for at least 30 minutes 5 or more days each week.  Do not use any products that contain nicotine or  tobacco, such as cigarettes, e-cigarettes, and chewing tobacco. If you need help quitting, ask your health care provider.  Do not use drugs.  If you are sexually active, practice safe sex. Use a condom or other form of protection to prevent STIs (sexually transmitted infections).  If told by your health care provider, take low-dose aspirin daily starting at age 28.  Find healthy ways to cope with stress, such as: ? Meditation, yoga, or  listening to music. ? Journaling. ? Talking to a trusted person. ? Spending time with friends and family. Safety  Always wear your seat belt while driving or riding in a vehicle.  Do not drive: ? If you have been drinking alcohol. Do not ride with someone who has been drinking. ? When you are tired or distracted. ? While texting.  Wear a helmet and other protective equipment during sports activities.  If you have firearms in your house, make sure you follow all gun safety procedures. What's next?  Go to your health care provider once a year for an annual wellness visit.  Ask your health care provider how often you should have your eyes and teeth checked.  Stay up to date on all vaccines. This information is not intended to replace advice given to you by your health care provider. Make sure you discuss any questions you have with your health care provider. Document Revised: 01/23/2019 Document Reviewed: 04/20/2018 Elsevier Patient Education  2021 [place], Adult The ears produce a substance called earwax that helps keep bacteria out of the ear and protects the skin in the ear canal. Occasionally, earwax can build up in the ear and cause discomfort or hearing loss. What are the causes? This condition is caused by a buildup of earwax. Ear canals are self-cleaning. Ear wax is made in the outer part of the ear canal and generally falls out in small amounts over time. When the self-cleaning mechanism is not working, earwax builds up and can cause decreased hearing and discomfort. Attempting to clean ears with cotton swabs can push the earwax deep into the ear canal and cause decreased hearing and pain. What increases the risk? This condition is more likely to develop in people who:  Clean their ears often with cotton swabs.  Pick at their ears.  Use earplugs or in-ear headphones often, or wear hearing aids. The following factors may also make you more likely to  develop this condition:  Being male.  Being of older age.  Naturally producing more earwax.  Having narrow ear canals.  Having earwax that is overly thick or sticky.  Having excess hair in the ear canal.  Having eczema.  Being dehydrated. What are the signs or symptoms? Symptoms of this condition include:  Reduced or muffled hearing.  A feeling of fullness in the ear or feeling that the ear is plugged.  Fluid coming from the ear.  Ear pain or an itchy ear.  Ringing in the ear.  Coughing.  Balance problems.  An obvious piece of earwax that can be seen inside the ear canal. How is this diagnosed? This condition may be diagnosed based on:  Your symptoms.  Your medical history.  An ear exam. During the exam, your health care provider will look into your ear with an instrument called an otoscope. You may have tests, including a hearing test. How is this treated? This condition may be treated by:  Using ear drops to soften the earwax.  Having the earwax removed by  a health care provider. The health care provider may: ? Flush the ear with water. ? Use an instrument that has a loop on the end (curette). ? Use a suction device.  Having surgery to remove the wax buildup. This may be done in severe cases. Follow these instructions at home:  Take over-the-counter and prescription medicines only as told by your health care provider.  Do not put any objects, including cotton swabs, into your ear. You can clean the opening of your ear canal with a washcloth or facial tissue.  Follow instructions from your health care provider about cleaning your ears. Do not overclean your ears.  Drink enough fluid to keep your urine pale yellow. This will help to thin the earwax.  Keep all follow-up visits as told. If earwax builds up in your ears often or if you use hearing aids, consider seeing your health care provider for routine, preventive ear cleanings. Ask your health care  provider how often you should schedule your cleanings.  If you have hearing aids, clean them according to instructions from the manufacturer and your health care provider.   Contact a health care provider if:  You have ear pain.  You develop a fever.  You have pus or other fluid coming from your ear.  You have hearing loss.  You have ringing in your ears that does not go away.  You feel like the room is spinning (vertigo).  Your symptoms do not improve with treatment. Get help right away if:  You have bleeding from the affected ear.  You have severe ear pain. Summary  Earwax can build up in the ear and cause discomfort or hearing loss.  The most common symptoms of this condition include reduced or muffled hearing, a feeling of fullness in the ear, or feeling that the ear is plugged.  This condition may be diagnosed based on your symptoms, your medical history, and an ear exam.  This condition may be treated by using ear drops to soften the earwax or by having the earwax removed by a health care provider.  Do not put any objects, including cotton swabs, into your ear. You can clean the opening of your ear canal with a washcloth or facial tissue. This information is not intended to replace advice given to you by your health care provider. Make sure you discuss any questions you have with your health care provider. Document Revised: 08/14/2019 Document Reviewed: 08/14/2019 Elsevier Patient Education  2021 [place] for Gastroesophageal Reflux Disease, Adult When you have gastroesophageal reflux disease (GERD), the foods you eat and your eating habits are very important. Choosing the right foods can help ease your discomfort. Think about working with a food expert (dietitian) to help you make good choices. What are tips for following this plan? Reading food labels  Look for foods that are low in saturated fat. Foods that may help with your symptoms  include: ? Foods that have less than 5% of daily value (DV) of fat. ? Foods that have 0 grams of trans fat. Cooking  Do not fry your food.  Cook your food by baking, steaming, grilling, or broiling. These are all methods that do not need a lot of fat for cooking.  To add flavor, try to use herbs that are low in spice and acidity. Meal planning  Choose healthy foods that are low in fat, such as: ? Fruits and vegetables. ? Whole grains. ? Low-fat dairy products. ? Lean meats, fish,  and poultry.  Eat small meals often instead of eating 3 large meals each day. Eat your meals slowly in a place where you are relaxed. Avoid bending over or lying down until 2-3 hours after eating.  Limit high-fat foods such as fatty meats or fried foods.  Limit your intake of fatty foods, such as oils, butter, and shortening.  Avoid the following as told by your doctor: ? Foods that cause symptoms. These may be different for different people. Keep a food diary to keep track of foods that cause symptoms. ? Alcohol. ? Drinking a lot of liquid with meals. ? Eating meals during the 2-3 hours before bed.   Lifestyle  Stay at a healthy weight. Ask your doctor what weight is healthy for you. If you need to lose weight, work with your doctor to do so safely.  Exercise for at least 30 minutes on 5 or more days each week, or as told by your doctor.  Wear loose-fitting clothes.  Do not smoke or use any products that contain nicotine or tobacco. If you need help quitting, ask your doctor.  Sleep with the head of your bed higher than your feet. Use a wedge under the mattress or blocks under the bed frame to raise the head of the bed.  Chew sugar-free gum after meals. What foods should eat? Eat a healthy, well-balanced diet of fruits, vegetables, whole grains, low-fat dairy products, lean meats, fish, and poultry. Each person is different. Foods that may cause symptoms in one person may not cause any symptoms  in another person. Work with your doctor to find foods that are safe for you. The items listed above may not be a complete list of what you can eat and drink. Contact a food expert for more options.   What foods should I avoid? Limiting some of these foods may help in managing the symptoms of GERD. Everyone is different. Talk with a food expert or your doctor to help you find the exact foods to avoid, if any. Fruits Any fruits prepared with added fat. Any fruits that cause symptoms. For some people, this may include citrus fruits, such as oranges, grapefruit, pineapple, and lemons. Vegetables Deep-fried vegetables. [place] fries. Any vegetables prepared with added fat. Any vegetables that cause symptoms. For some people, this may include tomatoes and tomato products, chili peppers, onions and garlic, and horseradish. Grains Pastries or quick breads with added fat. Meats and other proteins High-fat meats, such as fatty beef or pork, hot dogs, ribs, ham, sausage, salami, and bacon. Fried meat or protein, including fried fish and fried chicken. Nuts and nut butters, in large amounts. Dairy Whole milk and chocolate milk. Sour cream. Cream. Ice cream. Cream cheese. Milkshakes. Fats and oils Butter. Margarine. Shortening. Ghee. Beverages Coffee and tea, with or without caffeine. Carbonated beverages. Sodas. Energy drinks. Fruit juice made with acidic fruits, such as orange or grapefruit. Tomato juice. Alcoholic drinks. Sweets and desserts Chocolate and cocoa. Donuts. Seasonings and condiments Pepper. Peppermint and spearmint. Added salt. Any condiments, herbs, or seasonings that cause symptoms. For some people, this may include curry, hot sauce, or vinegar-based salad dressings. The items listed above may not be a complete list of what you should not eat and drink. Contact a food expert for more options. Questions to ask your doctor Diet and lifestyle changes are often the first steps that are taken  to manage symptoms of GERD. If diet and lifestyle changes do not help, talk with your doctor about  taking medicines. Where to find more information  International Foundation for Gastrointestinal Disorders: aboutgerd.org Summary  When you have GERD, food and lifestyle choices are very important in easing your symptoms.  Eat small meals often instead of 3 large meals a day. Eat your meals slowly and in a place where you are relaxed.  Avoid bending over or lying down until 2-3 hours after eating.  Limit high-fat foods such as fatty meats or fried foods. This information is not intended to replace advice given to you by your health care provider. Make sure you discuss any questions you have with your health care provider. Document Revised: 11/05/2019 Document Reviewed: 11/05/2019 Elsevier Patient Education  2021 [place].  Gastroesophageal Reflux Disease, Adult  Gastroesophageal reflux (GER) happens when acid from the stomach flows up into the tube that connects the mouth and the stomach (esophagus). Normally, food travels down the esophagus and stays in the stomach to be digested. With GER, food and stomach acid sometimes move back up into the esophagus. You may have a disease called gastroesophageal reflux disease (GERD) if the reflux:  Happens often.  Causes frequent or very bad symptoms.  Causes problems such as damage to the esophagus. When this happens, the esophagus becomes sore and swollen. Over time, GERD can make small holes (ulcers) in the lining of the esophagus. What are the causes? This condition is caused by a problem with the muscle between the esophagus and the stomach. When this muscle is weak or not normal, it does not close properly to keep food and acid from coming back up from the stomach. The muscle can be weak because of:  Tobacco use.  Pregnancy.  Having a certain type of hernia (hiatal hernia).  Alcohol use.  Certain foods and drinks, such as coffee,  chocolate, onions, and peppermint. What increases the risk?  Being overweight.  Having a disease that affects your connective tissue.  Taking NSAIDs, such a ibuprofen. What are the signs or symptoms?  Heartburn.  Difficult or painful swallowing.  The feeling of having a lump in the throat.  A bitter taste in the mouth.  Bad breath.  Having a lot of saliva.  Having an upset or bloated stomach.  Burping.  Chest pain. Different conditions can cause chest pain. Make sure you see your doctor if you have chest pain.  Shortness of breath or wheezing.  A long-term cough or a cough at night.  Wearing away of the surface of teeth (tooth enamel).  Weight loss. How is this treated?  Making changes to your diet.  Taking medicine.  Having surgery. Treatment will depend on how bad your symptoms are. Follow these instructions at home: Eating and drinking  Follow a diet as told by your doctor. You may need to avoid foods and drinks such as: ? Coffee and tea, with or without caffeine. ? Drinks that contain alcohol. ? Energy drinks and sports drinks. ? Bubbly (carbonated) drinks or sodas. ? Chocolate and cocoa. ? Peppermint and mint flavorings. ? Garlic and onions. ? Horseradish. ? Spicy and acidic foods. These include peppers, chili powder, curry powder, vinegar, hot sauces, and BBQ sauce. ? Citrus fruit juices and citrus fruits, such as oranges, lemons, and limes. ? Tomato-based foods. These include red sauce, chili, salsa, and pizza with red sauce. ? Fried and fatty foods. These include donuts, french fries, potato chips, and high-fat dressings. ? High-fat meats. These include hot dogs, rib eye steak, sausage, ham, and bacon. ? High-fat dairy items,  such as whole milk, butter, and cream cheese.  Eat small meals often. Avoid eating large meals.  Avoid drinking large amounts of liquid with your meals.  Avoid eating meals during the 2-3 hours before bedtime.  Avoid  lying down right after you eat.  Do not exercise right after you eat.   Lifestyle  Do not smoke or use any products that contain nicotine or tobacco. If you need help quitting, ask your doctor.  Try to lower your stress. If you need help doing this, ask your doctor.  If you are overweight, lose an amount of weight that is healthy for you. Ask your doctor about a safe weight loss goal.   General instructions  Pay attention to any changes in your symptoms.  Take over-the-counter and prescription medicines only as told by your doctor.  Do not take aspirin, ibuprofen, or other NSAIDs unless your doctor says it is okay.  Wear loose clothes. Do not wear anything tight around your waist.  Raise (elevate) the head of your bed about 6 inches (15 cm). You may need to use a wedge to do this.  Avoid bending over if this makes your symptoms worse.  Keep all follow-up visits. Contact a doctor if:  You have new symptoms.  You lose weight and you do not know why.  You have trouble swallowing or it hurts to swallow.  You have wheezing or a cough that keeps happening.  You have a hoarse voice.  Your symptoms do not get better with treatment. Get help right away if:  You have sudden pain in your arms, neck, jaw, teeth, or back.  You suddenly feel sweaty, dizzy, or light-headed.  You have chest pain or shortness of breath.  You vomit and the vomit is green, yellow, or black, or it looks like blood or coffee grounds.  You faint.  Your poop (stool) is red, bloody, or black.  You cannot swallow, drink, or eat. These symptoms may represent a serious problem that is an emergency. Do not wait to see if the symptoms will go away. Get medical help right away. Call your local emergency services (911 in the [place] Do not drive yourself to the hospital. Summary  If a person has gastroesophageal reflux disease (GERD), food and stomach acid move back up into the esophagus and cause symptoms or  problems such as damage to the esophagus.  Treatment will depend on how bad your symptoms are.  Follow a diet as told by your doctor.  Take all medicines only as told by your doctor. This information is not intended to replace advice given to you by your health care provider. Make sure you discuss any questions you have with your health care provider. Document Revised: 11/05/2019 Document Reviewed: 11/05/2019 Elsevier Patient Education  [place].

## 2020-09-29 LAB — HEPATITIS C ANTIBODY
Hepatitis C Ab: NONREACTIVE
SIGNAL TO CUT-OFF: 0.02 (ref <1.00)

## 2020-09-29 NOTE — Progress Notes (Signed)
Spoke with patient, is aware. Patient had other questions, virtual visit was offered and scheduled.

## 2020-10-02 ENCOUNTER — Telehealth (INDEPENDENT_AMBULATORY_CARE_PROVIDER_SITE_OTHER): Payer: Medicaid Other | Admitting: Family Medicine

## 2020-10-02 ENCOUNTER — Encounter: Payer: Self-pay | Admitting: Family Medicine

## 2020-10-02 DIAGNOSIS — I9589 Other hypotension: Secondary | ICD-10-CM | POA: Diagnosis not present

## 2020-10-02 DIAGNOSIS — E861 Hypovolemia: Secondary | ICD-10-CM | POA: Insufficient documentation

## 2020-10-02 DIAGNOSIS — N529 Male erectile dysfunction, unspecified: Secondary | ICD-10-CM | POA: Diagnosis not present

## 2020-10-02 DIAGNOSIS — K219 Gastro-esophageal reflux disease without esophagitis: Secondary | ICD-10-CM | POA: Insufficient documentation

## 2020-10-02 DIAGNOSIS — R1032 Left lower quadrant pain: Secondary | ICD-10-CM | POA: Insufficient documentation

## 2020-10-02 NOTE — Progress Notes (Signed)
Virtual Visit via Telephone Note  I connected with David Berry. on 10/02/20 at  8:30 AM EDT by telephone and verified that I am speaking with the correct person using two identifiers.   I discussed the limitations, risks, security and privacy concerns of performing an evaluation and management service by telephone and the availability of in person appointments. I also discussed with the patient that there may be a patient responsible charge related to this service. The patient expressed understanding and agreed to proceed.  Location patient: home Location provider: work or home office Participants present for the call: patient, provider Patient did not have a visit in the prior 7 days to address this/these issue(s).   History of Present Illness: Pt with ED.  States this has been an ongoing concern.  In the past patient advised his BP would not support certain medications for ED.  Patient states he is been increasing p.o. intake of water.  At last [facility] on 5/20 pt states he may drink 1 bottle of water every 3 days.  BP at [facility] on 5/20 was 100/78.  At that time patient endorsed dizziness with bending down.  Patient expresses frustration with check-in process for virtual visits as he does not feel like he needs to "tell everyone my business before I can speak with the doctor".   Observations/Objective: Patient sounds cheerful and well on the phone. I do not appreciate any SOB. Speech and thought processing are grossly intact. Patient reported vitals:  Assessment and Plan: Erectile dysfunction, unspecified erectile dysfunction type -Patient advised against use of phosphodiesterase inhibitors with current hypotension -Offered referral to urology.  Patient declines at this time. -Wishes to work on increasing p.o. intake of fluids and attempt to increase his blood pressure. -Continue to monitor  Hypotension due to hypovolemia -Patient encouraged to increase p.o. intake of water and  fluids -Patient to obtain BP monitor for home use   Follow Up Instructions:  F/u prn  [id number] [id number] [id number] [id number] 21-30 I did not refer this patient for an OV in the next 24 hours for this/these issue(s).  I discussed the assessment and treatment plan with the patient. The patient was provided an opportunity to ask questions and all were answered. The patient agreed with the plan and demonstrated an understanding of the instructions.   The patient was advised to call back or seek an in-person evaluation if the symptoms worsen or if the condition fails to improve as anticipated.  I provided 5: 34 minutes of non-face-to-face time during this encounter.   [name], MD

## 2020-10-08 ENCOUNTER — Inpatient Hospital Stay: Payer: Medicaid Other

## 2020-10-08 ENCOUNTER — Other Ambulatory Visit: Payer: Self-pay

## 2020-10-08 ENCOUNTER — Inpatient Hospital Stay: Payer: Medicaid Other | Attending: Physician Assistant

## 2020-10-08 VITALS — BP 90/62 | HR 87 | Temp 98.0°F | Resp 20 | Wt 131.8 lb

## 2020-10-08 DIAGNOSIS — D649 Anemia, unspecified: Secondary | ICD-10-CM | POA: Insufficient documentation

## 2020-10-08 DIAGNOSIS — Z5112 Encounter for antineoplastic immunotherapy: Secondary | ICD-10-CM | POA: Diagnosis not present

## 2020-10-08 DIAGNOSIS — C3490 Malignant neoplasm of unspecified part of unspecified bronchus or lung: Secondary | ICD-10-CM | POA: Insufficient documentation

## 2020-10-08 DIAGNOSIS — C3491 Malignant neoplasm of unspecified part of right bronchus or lung: Secondary | ICD-10-CM

## 2020-10-08 DIAGNOSIS — Z86718 Personal history of other venous thrombosis and embolism: Secondary | ICD-10-CM | POA: Diagnosis not present

## 2020-10-08 DIAGNOSIS — Z7901 Long term (current) use of anticoagulants: Secondary | ICD-10-CM | POA: Diagnosis not present

## 2020-10-08 DIAGNOSIS — Z79899 Other long term (current) drug therapy: Secondary | ICD-10-CM | POA: Insufficient documentation

## 2020-10-08 DIAGNOSIS — Z87891 Personal history of nicotine dependence: Secondary | ICD-10-CM | POA: Insufficient documentation

## 2020-10-08 DIAGNOSIS — Z7189 Other specified counseling: Secondary | ICD-10-CM

## 2020-10-08 DIAGNOSIS — Z95828 Presence of other vascular implants and grafts: Secondary | ICD-10-CM

## 2020-10-08 LAB — CBC WITH DIFFERENTIAL/PLATELET
Abs Immature Granulocytes: 0.01 10*3/uL (ref 0.00–0.07)
Basophils Absolute: 0 10*3/uL (ref 0.0–0.1)
Basophils Relative: 1 %
Eosinophils Absolute: 0.1 10*3/uL (ref 0.0–0.5)
Eosinophils Relative: 2 %
HCT: 33 % — ABNORMAL LOW (ref 39.0–52.0)
Hemoglobin: 10 g/dL — ABNORMAL LOW (ref 13.0–17.0)
Immature Granulocytes: 0 %
Lymphocytes Relative: 27 %
Lymphs Abs: 1.1 10*3/uL (ref 0.7–4.0)
MCH: 24.2 pg — ABNORMAL LOW (ref 26.0–34.0)
MCHC: 30.3 g/dL (ref 30.0–36.0)
MCV: 79.7 fL — ABNORMAL LOW (ref 80.0–100.0)
Monocytes Absolute: 0.6 10*3/uL (ref 0.1–1.0)
Monocytes Relative: 14 %
Neutro Abs: 2.3 10*3/uL (ref 1.7–7.7)
Neutrophils Relative %: 56 %
Platelets: 290 10*3/uL (ref 150–400)
RBC: 4.14 MIL/uL — ABNORMAL LOW (ref 4.22–5.81)
RDW: 15.3 % (ref 11.5–15.5)
WBC: 4.1 10*3/uL (ref 4.0–10.5)
nRBC: 0 % (ref 0.0–0.2)

## 2020-10-08 LAB — CMP (CANCER CENTER ONLY)
ALT: 7 U/L (ref 0–44)
AST: 11 U/L — ABNORMAL LOW (ref 15–41)
Albumin: 3.7 g/dL (ref 3.5–5.0)
Alkaline Phosphatase: 91 U/L (ref 38–126)
Anion gap: 7 (ref 5–15)
BUN: 8 mg/dL (ref 6–20)
CO2: 26 mmol/L (ref 22–32)
Calcium: 9.4 mg/dL (ref 8.9–10.3)
Chloride: 107 mmol/L (ref 98–111)
Creatinine: 0.8 mg/dL (ref 0.61–1.24)
GFR, Estimated: >60 mL/min (ref >60)
Glucose, Bld: 92 mg/dL (ref 70–99)
Potassium: 4.2 mmol/L (ref 3.5–5.1)
Sodium: 140 mmol/L (ref 135–145)
Total Bilirubin: 0.3 mg/dL (ref 0.3–1.2)
Total Protein: 6.6 g/dL (ref 6.5–8.1)

## 2020-10-08 LAB — TSH: TSH: 1.059 u[IU]/mL (ref 0.320–4.118)

## 2020-10-08 MED ORDER — FAMOTIDINE 20 MG PO TABS
ORAL_TABLET | ORAL | Status: AC
  Filled 2020-10-08: qty 1

## 2020-10-08 MED ORDER — FAMOTIDINE 20 MG PO TABS
20.0000 mg | ORAL_TABLET | Freq: Once | ORAL | Status: AC
  Administered 2020-10-08: 20 mg via ORAL

## 2020-10-08 MED ORDER — DIPHENHYDRAMINE HCL 25 MG PO CAPS
ORAL_CAPSULE | ORAL | Status: AC
  Filled 2020-10-08: qty 1

## 2020-10-08 MED ORDER — SODIUM CHLORIDE 0.9 % IV SOLN
Freq: Once | INTRAVENOUS | Status: AC
Start: 2020-10-08 — End: 2020-10-08
  Filled 2020-10-08: qty 250

## 2020-10-08 MED ORDER — ACETAMINOPHEN 325 MG PO TABS
ORAL_TABLET | ORAL | Status: AC
  Filled 2020-10-08: qty 2

## 2020-10-08 MED ORDER — ACETAMINOPHEN 325 MG PO TABS
650.0000 mg | ORAL_TABLET | Freq: Once | ORAL | Status: AC
  Administered 2020-10-08: 650 mg via ORAL

## 2020-10-08 MED ORDER — SODIUM CHLORIDE 0.9% FLUSH
10.0000 mL | INTRAVENOUS | Status: DC | PRN
  Administered 2020-10-08: 10 mL
  Filled 2020-10-08: qty 10

## 2020-10-08 MED ORDER — DIPHENHYDRAMINE HCL 25 MG PO TABS
25.0000 mg | ORAL_TABLET | Freq: Once | ORAL | Status: AC
  Administered 2020-10-08: 25 mg via ORAL
  Filled 2020-10-08: qty 1

## 2020-10-08 MED ORDER — HEPARIN SOD (PORK) LOCK FLUSH 100 UNIT/ML IV SOLN
500.0000 [IU] | Freq: Once | INTRAVENOUS | Status: AC | PRN
  Administered 2020-10-08: 500 [IU]
  Filled 2020-10-08: qty 5

## 2020-10-08 MED ORDER — SODIUM CHLORIDE 0.9 % IV SOLN
1200.0000 mg | Freq: Once | INTRAVENOUS | Status: AC
  Administered 2020-10-08: 1200 mg via INTRAVENOUS
  Filled 2020-10-08: qty 20

## 2020-10-08 NOTE — Patient Instructions (Signed)
[facility]  Discharge Instructions: Thank you for choosing [facility] to provide your oncology and hematology care.   If you have a lab appointment with the [facility], please go directly to the [facility] and check in at the registration area.   Wear comfortable clothing and clothing appropriate for easy access to any Portacath or PICC line.   We strive to give you quality time with your provider. You may need to reschedule your appointment if you arrive late (15 or more minutes).  Arriving late affects you and other patients whose appointments are after yours.  Also, if you miss three or more appointments without notifying the office, you may be dismissed from the clinic at the provider's discretion.      For prescription refill requests, have your pharmacy contact our office and allow 72 hours for refills to be completed.    Today you received the following chemotherapy and/or immunotherapy agents Tecentriq      To help prevent nausea and vomiting after your treatment, we encourage you to take your nausea medication as directed.  BELOW ARE SYMPTOMS THAT SHOULD BE REPORTED IMMEDIATELY: . *FEVER GREATER THAN 100.4 F (38 C) OR HIGHER . *CHILLS OR SWEATING . *NAUSEA AND VOMITING THAT IS NOT CONTROLLED WITH YOUR NAUSEA MEDICATION . *UNUSUAL SHORTNESS OF BREATH . *UNUSUAL BRUISING OR BLEEDING . *URINARY PROBLEMS (pain or burning when urinating, or frequent urination) . *BOWEL PROBLEMS (unusual diarrhea, constipation, pain near the anus) . TENDERNESS IN MOUTH AND THROAT WITH OR WITHOUT PRESENCE OF ULCERS (sore throat, sores in mouth, or a toothache) . UNUSUAL RASH, SWELLING OR PAIN  . UNUSUAL VAGINAL DISCHARGE OR ITCHING   Items with * indicate a potential emergency and should be followed up as soon as possible or go to the Emergency Department if any problems should occur.  Please show the CHEMOTHERAPY ALERT CARD or IMMUNOTHERAPY ALERT  CARD at check-in to the Emergency Department and triage nurse.  Should you have questions after your visit or need to cancel or reschedule your appointment, please contact [facility]  Dept: [phone]  and follow the prompts.  Office hours are 8:00 a.m. to 4:30 p.m. Monday - Friday. Please note that voicemails left after 4:00 p.m. may not be returned until the following business day.  We are closed weekends and major holidays. You have access to a nurse at all times for urgent questions. Please call the main number to the clinic Dept: [phone] and follow the prompts.   For any non-urgent questions, you may also contact your provider using MyChart. We now offer e-Visits for anyone 72 and older to request care online for non-urgent symptoms. For details visit [email or web address].   Also download the MyChart app! Go to the app store, search "MyChart", open the app, select [facility], and log in with your MyChart username and password.  Due to Covid, a mask is required upon entering the hospital/clinic. If you do not have a mask, one will be given to you upon arrival. For doctor visits, patients may have 1 support person aged 74 or older with them. For treatment visits, patients cannot have anyone with them due to current Covid guidelines and our immunocompromised population.   Atezolizumab injection What is this medicine? ATEZOLIZUMAB (a te zoe LIZ ue mab) is a monoclonal antibody. It is used to treat bladder cancer (urothelial cancer), liver cancer, lung cancer, and melanoma. This medicine may be used for other  purposes; ask your health care provider or pharmacist if you have questions. COMMON BRAND NAME(S): Tecentriq What should I tell my health care provider before I take this medicine? They need to know if you have any of these conditions:  autoimmune diseases like Crohn's disease, ulcerative colitis, or lupus  have had or planning to have an allogeneic  stem cell transplant (uses someone else's stem cells)  history of organ transplant  history of radiation to the chest  nervous system problems like myasthenia gravis or Guillain-Barre syndrome  an unusual or allergic reaction to atezolizumab, other medicines, foods, dyes, or preservatives  pregnant or trying to get pregnant  breast-feeding How should I use this medicine? This medicine is for infusion into a vein. It is given by a health care professional in a hospital or clinic setting. A special MedGuide will be given to you before each treatment. Be sure to read this information carefully each time. Talk to your pediatrician regarding the use of this medicine in children. Special care may be needed. Overdosage: If you think you have taken too much of this medicine contact a poison control center or emergency room at once. NOTE: This medicine is only for you. Do not share this medicine with others. What if I miss a dose? It is important not to miss your dose. Call your doctor or health care professional if you are unable to keep an appointment. What may interact with this medicine? Interactions have not been studied. This list may not describe all possible interactions. Give your health care provider a list of all the medicines, herbs, non-prescription drugs, or dietary supplements you use. Also tell them if you smoke, drink alcohol, or use illegal drugs. Some items may interact with your medicine. What should I watch for while using this medicine? Your condition will be monitored carefully while you are receiving this medicine. You may need blood work done while you are taking this medicine. Do not become pregnant while taking this medicine or for at least 5 months after stopping it. Women should inform their doctor if they wish to become pregnant or think they might be pregnant. There is a potential for serious side effects to an unborn child. Talk to your health care professional or  pharmacist for more information. Do not breast-feed an infant while taking this medicine or for at least 5 months after the last dose. What side effects may I notice from receiving this medicine? Side effects that you should report to your doctor or health care professional as soon as possible:  allergic reactions like skin rash, itching or hives, swelling of the face, lips, or tongue  black, tarry stools  bloody or watery diarrhea  breathing problems  changes in vision  chest pain or chest tightness  chills  facial flushing  fever  headache  signs and symptoms of high blood sugar such as dizziness; dry mouth; dry skin; fruity breath; nausea; stomach pain; increased hunger or thirst; increased urination  signs and symptoms of liver injury like dark yellow or brown urine; general ill feeling or flu-like symptoms; light-colored stools; loss of appetite; nausea; right upper belly pain; unusually weak or tired; yellowing of the eyes or skin  stomach pain  trouble passing urine or change in the amount of urine Side effects that usually do not require medical attention (report to your doctor or health care professional if they continue or are bothersome):  bone pain  cough  diarrhea  joint pain  muscle pain  muscle weakness  swelling of arms or legs  tiredness  weight loss This list may not describe all possible side effects. Call your doctor for medical advice about side effects. You may report side effects to FDA at 1-800-FDA-1088. Where should I keep my medicine? This drug is given in a hospital or clinic and will not be stored at home. NOTE: This sheet is a summary. It may not cover all possible information. If you have questions about this medicine, talk to your doctor, pharmacist, or health care provider.  2021 Elsevier/Gold Standard (2020-01-24 13:59:34)

## 2020-10-28 ENCOUNTER — Other Ambulatory Visit: Payer: Self-pay | Admitting: Physician Assistant

## 2020-10-28 DIAGNOSIS — C3491 Malignant neoplasm of unspecified part of right bronchus or lung: Secondary | ICD-10-CM

## 2020-10-29 ENCOUNTER — Inpatient Hospital Stay: Payer: Medicaid Other

## 2020-10-29 ENCOUNTER — Other Ambulatory Visit: Payer: Self-pay

## 2020-10-29 ENCOUNTER — Inpatient Hospital Stay (HOSPITAL_BASED_OUTPATIENT_CLINIC_OR_DEPARTMENT_OTHER): Payer: Medicaid Other | Admitting: Physician Assistant

## 2020-10-29 VITALS — BP 99/63 | HR 81 | Temp 97.0°F | Resp 18 | Wt 133.4 lb

## 2020-10-29 DIAGNOSIS — C3491 Malignant neoplasm of unspecified part of right bronchus or lung: Secondary | ICD-10-CM

## 2020-10-29 DIAGNOSIS — Z298 Encounter for other specified prophylactic measures: Secondary | ICD-10-CM | POA: Diagnosis not present

## 2020-10-29 DIAGNOSIS — Z2989 Encounter for other specified prophylactic measures: Secondary | ICD-10-CM | POA: Insufficient documentation

## 2020-10-29 DIAGNOSIS — Z7189 Other specified counseling: Secondary | ICD-10-CM

## 2020-10-29 DIAGNOSIS — Z95828 Presence of other vascular implants and grafts: Secondary | ICD-10-CM

## 2020-10-29 DIAGNOSIS — D509 Iron deficiency anemia, unspecified: Secondary | ICD-10-CM | POA: Insufficient documentation

## 2020-10-29 DIAGNOSIS — Z5112 Encounter for antineoplastic immunotherapy: Secondary | ICD-10-CM | POA: Diagnosis not present

## 2020-10-29 LAB — CBC WITH DIFFERENTIAL (CANCER CENTER ONLY)
Abs Immature Granulocytes: 0.01 10*3/uL (ref 0.00–0.07)
Basophils Absolute: 0 10*3/uL (ref 0.0–0.1)
Basophils Relative: 1 %
Eosinophils Absolute: 0.1 10*3/uL (ref 0.0–0.5)
Eosinophils Relative: 2 %
HCT: 33.2 % — ABNORMAL LOW (ref 39.0–52.0)
Hemoglobin: 10.2 g/dL — ABNORMAL LOW (ref 13.0–17.0)
Immature Granulocytes: 0 %
Lymphocytes Relative: 22 %
Lymphs Abs: 0.9 10*3/uL (ref 0.7–4.0)
MCH: 24.1 pg — ABNORMAL LOW (ref 26.0–34.0)
MCHC: 30.7 g/dL (ref 30.0–36.0)
MCV: 78.5 fL — ABNORMAL LOW (ref 80.0–100.0)
Monocytes Absolute: 0.7 10*3/uL (ref 0.1–1.0)
Monocytes Relative: 16 %
Neutro Abs: 2.5 10*3/uL (ref 1.7–7.7)
Neutrophils Relative %: 59 %
Platelet Count: 283 10*3/uL (ref 150–400)
RBC: 4.23 MIL/uL (ref 4.22–5.81)
RDW: 15.5 % (ref 11.5–15.5)
WBC Count: 4.2 10*3/uL (ref 4.0–10.5)
nRBC: 0 % (ref 0.0–0.2)

## 2020-10-29 LAB — CMP (CANCER CENTER ONLY)
ALT: 9 U/L (ref 0–44)
AST: 16 U/L (ref 15–41)
Albumin: 3.7 g/dL (ref 3.5–5.0)
Alkaline Phosphatase: 83 U/L (ref 38–126)
Anion gap: 7 (ref 5–15)
BUN: 11 mg/dL (ref 6–20)
CO2: 27 mmol/L (ref 22–32)
Calcium: 9.7 mg/dL (ref 8.9–10.3)
Chloride: 104 mmol/L (ref 98–111)
Creatinine: 0.96 mg/dL (ref 0.61–1.24)
GFR, Estimated: >60 mL/min (ref >60)
Glucose, Bld: 89 mg/dL (ref 70–99)
Potassium: 4.3 mmol/L (ref 3.5–5.1)
Sodium: 138 mmol/L (ref 135–145)
Total Bilirubin: 0.2 mg/dL — ABNORMAL LOW (ref 0.3–1.2)
Total Protein: 6.9 g/dL (ref 6.5–8.1)

## 2020-10-29 LAB — TSH: TSH: 1.118 u[IU]/mL (ref 0.320–4.118)

## 2020-10-29 MED ORDER — ACETAMINOPHEN 325 MG PO TABS
650.0000 mg | ORAL_TABLET | Freq: Once | ORAL | Status: AC
  Administered 2020-10-29: 650 mg via ORAL

## 2020-10-29 MED ORDER — SODIUM CHLORIDE 0.9% FLUSH
10.0000 mL | INTRAVENOUS | Status: DC | PRN
  Administered 2020-10-29: 10 mL
  Filled 2020-10-29: qty 10

## 2020-10-29 MED ORDER — DIPHENHYDRAMINE HCL 25 MG PO TABS
25.0000 mg | ORAL_TABLET | Freq: Once | ORAL | Status: AC
  Administered 2020-10-29: 25 mg via ORAL
  Filled 2020-10-29: qty 1

## 2020-10-29 MED ORDER — HEPARIN SOD (PORK) LOCK FLUSH 100 UNIT/ML IV SOLN
500.0000 [IU] | Freq: Once | INTRAVENOUS | Status: AC | PRN
  Administered 2020-10-29: 500 [IU]
  Filled 2020-10-29: qty 5

## 2020-10-29 MED ORDER — ACETAMINOPHEN 325 MG PO TABS
ORAL_TABLET | ORAL | Status: AC
  Filled 2020-10-29: qty 2

## 2020-10-29 MED ORDER — SODIUM CHLORIDE 0.9 % IV SOLN
1200.0000 mg | Freq: Once | INTRAVENOUS | Status: AC
  Administered 2020-10-29: 1200 mg via INTRAVENOUS
  Filled 2020-10-29: qty 20

## 2020-10-29 MED ORDER — DIPHENHYDRAMINE HCL 25 MG PO CAPS
ORAL_CAPSULE | ORAL | Status: AC
  Filled 2020-10-29: qty 1

## 2020-10-29 MED ORDER — FAMOTIDINE 20 MG PO TABS
20.0000 mg | ORAL_TABLET | Freq: Once | ORAL | Status: AC
  Administered 2020-10-29: 20 mg via ORAL

## 2020-10-29 MED ORDER — LIDOCAINE-PRILOCAINE 2.5-2.5 % EX CREA: 1.0000 "application " | TOPICAL_CREAM | CUTANEOUS | 0 refills | Status: DC | PRN

## 2020-10-29 MED ORDER — SODIUM CHLORIDE 0.9 % IV SOLN
Freq: Once | INTRAVENOUS | Status: AC
  Filled 2020-10-29: qty 250

## 2020-10-29 MED ORDER — FAMOTIDINE 20 MG PO TABS
ORAL_TABLET | ORAL | Status: AC
  Filled 2020-10-29: qty 1

## 2020-10-29 NOTE — Progress Notes (Signed)
HEMATOLOGY/ONCOLOGY CLINIC NOTE  Date of Service: 10/29/2020  Patient Care Team: [name], MD as PCP - General (Family Medicine)   CHIEF COMPLAINT:  Metastatic Poorly differentiated lung adenocarcinoma  Current Treatment:  Atezolizumab maintenance   INTERVAL HISTORY:   Jalynn Betzold. is a 55 y.o. male who is here for evaluation and management of metastatic poorly differentiated lung adenocarcinoma. He is here for C29 of Atezolizumab. The patient's last visit with [place] was on 09/17/2020. Patient is unaccompanied for this visit. He continues to tolerate the treatment without any significant limitations.  He reports his energy level is stable and he has a good appetite.  He continues to complete all his ADLs on the.  Patient denies any nausea, vomiting or abdominal pain.  His bowel movements are regular without any diarrhea or constipation.  Patient denies easy bruising or signs of bleeding.  Patient denies any fevers, chills, night sweats, shortness of breath, chest pain or cough.  He has no other complaints.  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[place])    pt denies this   GERD (gastroesophageal reflux disease)    Lung cancer ([place]) dx'd 01/2019   Peripheral vascular disease ([place])    blood clot in neck Sept 12, 2020    SURGICAL HISTORY: Past Surgical History:  Procedure Laterality Date   APPENDECTOMY     teenager   INGUINAL HERNIA REPAIR Right 2016, 2017   [place], [facility]-removed mesh   IR IMAGING GUIDED PORT INSERTION  04/26/2019   TOOTH EXTRACTION N/A 03/14/2020   Procedure: DENTAL RESTORATION/EXTRACTIONS;  Surgeon: Diona Browner, DDS;  Location: [facility];  Service: Oral Surgery;  Laterality: N/A;    SOCIAL HISTORY: Social History   Socioeconomic History   Marital status: Single    Spouse name: Not on file    Number of children: 1   Years of education: GED   Highest education level: Some college, no degree  Occupational History    Comment: fork lift driver  Tobacco Use   Smoking status: Former    Pack years: 0.00    Types: Cigars    Quit date: 01/20/2019    Years since quitting: 1.7   Smokeless tobacco: Never   Tobacco comments:    smokes 3 black and mild cigars per day  Substance and Sexual Activity   Alcohol use: Not Currently   Drug use: No   Sexual activity: Yes  Other Topics Concern   Not on file  Social History Narrative   Lives alone   Caffeine- coffee, 20 oz daily, tea occas   Social Determinants of Health   Financial Resource Strain: Low Risk    Difficulty of Paying Living Expenses: Not very hard  Food Insecurity: No Food Insecurity   Worried About Charity fundraiser in the Last Year: Never true   Ran Out of Food in the Last Year: Never true  Transportation Needs: No Transportation Needs   Lack of Transportation (Medical): No   Lack of Transportation (Non-Medical): No  Physical Activity: Sufficiently Active   Days of Exercise per Week: 3 days   Minutes of Exercise per Session: 150+ min  Stress: No Stress Concern Present   Feeling of Stress : Not at all  Social Connections: Socially Isolated   Frequency of  Communication with Friends and Family: More than three times a week   Frequency of Social Gatherings with Friends and Family: Once a week   Attends Religious Services: Never   Marine scientist or Organizations: No   Attends Music therapist: Not on file   Marital Status: Never married  Human resources officer Violence: Not on file    FAMILY HISTORY: Family History  Problem Relation Age of Onset   Prostate cancer Father     ALLERGIES:  is allergic to pregabalin.  MEDICATIONS:  Current Outpatient Medications  Medication Sig Dispense Refill   apixaban (ELIQUIS) 5 MG TABS tablet Take 1 tablet (5 mg total) by mouth 2 (two) times daily. 60 tablet  11   esomeprazole (NEXIUM) 40 MG capsule TAKE 1 CAPSULE(40 MG) BY MOUTH DAILY 30 capsule 1   lidocaine-prilocaine (EMLA) cream Apply 1 application topically as needed. 30 g 0   LORazepam (ATIVAN) 0.5 MG tablet Take 1 tablet (0.5 mg total) by mouth every 8 (eight) hours as needed for anxiety. 60 tablet 0   No current facility-administered medications for this visit.   Facility-Administered Medications Ordered in Other Visits  Medication Dose Route Frequency Provider Last Rate Last Admin   sodium chloride flush (NS) 0.9 % injection 10 mL  10 mL Intracatheter PRN Brunetta Genera, MD   10 mL at 10/29/20 1223   REVIEW OF SYSTEMS:   Review of Systems  Constitutional:  Negative for chills, fever, malaise/fatigue and weight loss.  HENT:  Negative for congestion and sore throat.   Respiratory:  Negative for cough, hemoptysis, shortness of breath and wheezing.   Cardiovascular:  Negative for chest pain and palpitations.  Gastrointestinal:  Negative for abdominal pain, blood in stool, constipation, diarrhea, melena, nausea and vomiting.  Skin:  Negative for rash.  Neurological:  Negative for dizziness and headaches.  Endo/Heme/Allergies:  Does not bruise/bleed easily.    PHYSICAL EXAMINATION: ECOG FS:1 - Symptomatic but completely ambulatory  Vitals:   10/29/20 1030  BP: 99/63  Pulse: 81  Resp: 18  Temp: (!) 97 F (36.1 C)  SpO2: 100%   Wt Readings from Last 3 Encounters:  10/29/20 133 lb 6.4 oz (60.5 kg)  10/08/20 131 lb 12 oz (59.8 kg)  09/26/20 131 lb 9.6 oz (59.7 kg)   Body mass index is 18.61 kg/m.    GENERAL:alert, in no acute distress and comfortable SKIN: no acute rashes, no significant lesions EYES: conjunctiva are pink and non-injected, sclera anicteric OROPHARYNX: MMM, no exudates, no oropharyngeal erythema or ulceration NECK: supple, no JVD LYMPH:  no palpable lymphadenopathy in the cervical, axillary or inguinal regions LUNGS: clear to auscultation b/l with  normal respiratory effort HEART: regular rate & rhythm ABDOMEN:  normoactive bowel sounds , non tender, not distended. Extremity: no pedal edema PSYCH: alert & oriented x 3 with fluent speech NEURO: no focal motor/sensory deficits   LABORATORY DATA:  I have reviewed the data as listed  . CBC Latest Ref Rng & Units 10/29/2020 10/08/2020 09/17/2020  WBC 4.0 - 10.5 K/uL 4.2 4.1 4.1  Hemoglobin 13.0 - 17.0 g/dL 10.2(L) 10.0(L) 10.4(L)  Hematocrit 39.0 - 52.0 % 33.2(L) 33.0(L) 34.3(L)  Platelets 150 - 400 K/uL 283 290 268    . CMP Latest Ref Rng & Units 10/29/2020 10/08/2020 09/17/2020  Glucose 70 - 99 mg/dL 89 92 98  BUN 6 - 20 mg/dL $Remove'11 8 9  'NdvrJrc$ Creatinine 0.61 - 1.24 mg/dL 0.96 0.80 0.83  Sodium 135 - 145  mmol/L 138 140 139  Potassium 3.5 - 5.1 mmol/L 4.3 4.2 3.8  Chloride 98 - 111 mmol/L 104 107 105  CO2 22 - 32 mmol/L $RemoveB'27 26 26  'XTpxamgP$ Calcium 8.9 - 10.3 mg/dL 9.7 9.4 9.5  Total Protein 6.5 - 8.1 g/dL 6.9 6.6 6.9  Total Bilirubin 0.3 - 1.2 mg/dL 0.2(L) 0.3 0.3  Alkaline Phos 38 - 126 U/L 83 91 89  AST 15 - 41 U/L 16 11(L) 18  ALT 0 - 44 U/L $Remo'9 7 8    'NUBGl$ 01/22/2019 [facility]: Tumor Mutational Burden     01/22/2019 PD-L1 Immunohistochemistry Analysis    01/22/2019 Soft Tissue Needle Core Biopsy Surgical Pathology     RADIOGRAPHIC STUDIES: No results found.  ASSESSMENT & PLAN:  1. Metastatic Poorly differentiated lung adenocarcinoma -Presented with Large neck mass, mediastinal mass, renal mass, and questionable bone lesions  no brain mets on MRI brain -01/22/2019 PD-L1 Immunohistochemistry Analysis which revealed "Tumor Proportion Score (TPS) 50%" -Patient started Atezolizumab on 03/21/2019. -Most recent CT scan from 05/21/2020 and MRI abdomen from 06/19/2020 revealed no evidence of disease. -Patient presents today for Cycle 29, Day 1. Labs from today were reviewed without any intervention needed. Patient will proceed with treatment as planned. He will return on 11/19/2020 prior to  Cycle 30. He will be due for a scan in July/August which I have ordered prior to his visit with Dr. Demonta Wombles [place] on 12/10/2020.   2. 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 Symptomatic hemorrhoids: -Continue to monitor.   5. Microcytic anemia: -Hemoglobin is stable at 10.2. MCV 78.5.  -We will check iron panel at next visit.    6.Thrombocytosis:  -- likely due to paraneoplastic effect of tumor and reactive due to inflammation and tissue inflammation from RT. -- now resolved.   7. Nicotine dependence -He was counseled about tobacco cessation.  -has quit since cancer diagnosis.   All of the patient's questions were answered with apparent satisfaction. The patient knows to call the clinic with any problems, questions or concerns.  I have spent a total of 30 minutes minutes of face-to-face and non-face-to-face time, preparing to see the patient, obtaining and/or reviewing separately obtained history, performing a medically appropriate examination, counseling and educating the patient, ordering tests/medications, documenting clinical information in the electronic health record, and care coordination.    Lincoln Brigham PA-C Hematology and Oncology Surgery [facility] P: [phone]

## 2020-11-19 ENCOUNTER — Other Ambulatory Visit: Payer: Self-pay

## 2020-11-19 ENCOUNTER — Inpatient Hospital Stay: Payer: Medicaid Other

## 2020-11-19 ENCOUNTER — Other Ambulatory Visit: Payer: Self-pay | Admitting: Hematology

## 2020-11-19 ENCOUNTER — Inpatient Hospital Stay: Payer: Medicaid Other | Attending: Hematology

## 2020-11-19 VITALS — BP 98/64 | HR 82 | Temp 98.0°F | Resp 16 | Wt 131.1 lb

## 2020-11-19 DIAGNOSIS — Z86718 Personal history of other venous thrombosis and embolism: Secondary | ICD-10-CM | POA: Diagnosis not present

## 2020-11-19 DIAGNOSIS — Z7189 Other specified counseling: Secondary | ICD-10-CM

## 2020-11-19 DIAGNOSIS — Z7901 Long term (current) use of anticoagulants: Secondary | ICD-10-CM | POA: Insufficient documentation

## 2020-11-19 DIAGNOSIS — Z5112 Encounter for antineoplastic immunotherapy: Secondary | ICD-10-CM | POA: Diagnosis not present

## 2020-11-19 DIAGNOSIS — C3491 Malignant neoplasm of unspecified part of right bronchus or lung: Secondary | ICD-10-CM

## 2020-11-19 DIAGNOSIS — Z87891 Personal history of nicotine dependence: Secondary | ICD-10-CM | POA: Insufficient documentation

## 2020-11-19 DIAGNOSIS — D649 Anemia, unspecified: Secondary | ICD-10-CM | POA: Diagnosis not present

## 2020-11-19 DIAGNOSIS — Z79899 Other long term (current) drug therapy: Secondary | ICD-10-CM | POA: Insufficient documentation

## 2020-11-19 DIAGNOSIS — Z95828 Presence of other vascular implants and grafts: Secondary | ICD-10-CM

## 2020-11-19 DIAGNOSIS — C3490 Malignant neoplasm of unspecified part of unspecified bronchus or lung: Secondary | ICD-10-CM | POA: Insufficient documentation

## 2020-11-19 LAB — CBC WITH DIFFERENTIAL (CANCER CENTER ONLY)
Abs Immature Granulocytes: 0.01 10*3/uL (ref 0.00–0.07)
Basophils Absolute: 0 10*3/uL (ref 0.0–0.1)
Basophils Relative: 0 %
Eosinophils Absolute: 0 10*3/uL (ref 0.0–0.5)
Eosinophils Relative: 1 %
HCT: 34.2 % — ABNORMAL LOW (ref 39.0–52.0)
Hemoglobin: 10.5 g/dL — ABNORMAL LOW (ref 13.0–17.0)
Immature Granulocytes: 0 %
Lymphocytes Relative: 15 %
Lymphs Abs: 0.7 10*3/uL (ref 0.7–4.0)
MCH: 24.2 pg — ABNORMAL LOW (ref 26.0–34.0)
MCHC: 30.7 g/dL (ref 30.0–36.0)
MCV: 78.8 fL — ABNORMAL LOW (ref 80.0–100.0)
Monocytes Absolute: 0.7 10*3/uL (ref 0.1–1.0)
Monocytes Relative: 14 %
Neutro Abs: 3.4 10*3/uL (ref 1.7–7.7)
Neutrophils Relative %: 70 %
Platelet Count: 283 10*3/uL (ref 150–400)
RBC: 4.34 MIL/uL (ref 4.22–5.81)
RDW: 15.1 % (ref 11.5–15.5)
WBC Count: 4.8 10*3/uL (ref 4.0–10.5)
nRBC: 0 % (ref 0.0–0.2)

## 2020-11-19 LAB — CMP (CANCER CENTER ONLY)
ALT: 8 U/L (ref 0–44)
AST: 15 U/L (ref 15–41)
Albumin: 3.6 g/dL (ref 3.5–5.0)
Alkaline Phosphatase: 94 U/L (ref 38–126)
Anion gap: 8 (ref 5–15)
BUN: 9 mg/dL (ref 6–20)
CO2: 28 mmol/L (ref 22–32)
Calcium: 9.5 mg/dL (ref 8.9–10.3)
Chloride: 105 mmol/L (ref 98–111)
Creatinine: 0.89 mg/dL (ref 0.61–1.24)
GFR, Estimated: >60 mL/min (ref >60)
Glucose, Bld: 100 mg/dL — ABNORMAL HIGH (ref 70–99)
Potassium: 3.9 mmol/L (ref 3.5–5.1)
Sodium: 141 mmol/L (ref 135–145)
Total Bilirubin: 0.3 mg/dL (ref 0.3–1.2)
Total Protein: 7.1 g/dL (ref 6.5–8.1)

## 2020-11-19 LAB — IRON AND TIBC
Iron: 20 ug/dL — ABNORMAL LOW (ref 42–163)
Saturation Ratios: 5 % — ABNORMAL LOW (ref 20–55)
TIBC: 380 ug/dL (ref 202–409)
UIBC: 360 ug/dL (ref 117–376)

## 2020-11-19 LAB — FERRITIN: Ferritin: 10 ng/mL — ABNORMAL LOW (ref 24–336)

## 2020-11-19 MED ORDER — ACETAMINOPHEN 325 MG PO TABS
650.0000 mg | ORAL_TABLET | Freq: Once | ORAL | Status: AC
Start: 2020-11-19 — End: 2020-11-19
  Administered 2020-11-19: 650 mg via ORAL

## 2020-11-19 MED ORDER — DIPHENHYDRAMINE HCL 25 MG PO CAPS
ORAL_CAPSULE | ORAL | Status: AC
  Filled 2020-11-19: qty 1

## 2020-11-19 MED ORDER — SODIUM CHLORIDE 0.9 % IV SOLN
Freq: Once | INTRAVENOUS | Status: AC
  Filled 2020-11-19: qty 250

## 2020-11-19 MED ORDER — DIPHENHYDRAMINE HCL 25 MG PO TABS
25.0000 mg | ORAL_TABLET | Freq: Once | ORAL | Status: AC
  Administered 2020-11-19: 25 mg via ORAL
  Filled 2020-11-19: qty 1

## 2020-11-19 MED ORDER — SODIUM CHLORIDE 0.9% FLUSH
10.0000 mL | INTRAVENOUS | Status: DC | PRN
  Administered 2020-11-19: 10 mL
  Filled 2020-11-19: qty 10

## 2020-11-19 MED ORDER — FAMOTIDINE 20 MG IN NS 100 ML IVPB
INTRAVENOUS | Status: AC
  Filled 2020-11-19: qty 100

## 2020-11-19 MED ORDER — ACETAMINOPHEN 325 MG PO TABS
ORAL_TABLET | ORAL | Status: AC
  Filled 2020-11-19: qty 2

## 2020-11-19 MED ORDER — FAMOTIDINE 20 MG PO TABS
20.0000 mg | ORAL_TABLET | Freq: Once | ORAL | Status: AC
  Administered 2020-11-19: 20 mg via ORAL

## 2020-11-19 MED ORDER — HEPARIN SOD (PORK) LOCK FLUSH 100 UNIT/ML IV SOLN
500.0000 [IU] | Freq: Once | INTRAVENOUS | Status: AC | PRN
  Administered 2020-11-19: 500 [IU]
  Filled 2020-11-19: qty 5

## 2020-11-19 MED ORDER — FAMOTIDINE 20 MG PO TABS
ORAL_TABLET | ORAL | Status: AC
  Filled 2020-11-19: qty 1

## 2020-11-19 MED ORDER — SODIUM CHLORIDE 0.9 % IV SOLN
1200.0000 mg | Freq: Once | INTRAVENOUS | Status: AC
  Administered 2020-11-19: 1200 mg via INTRAVENOUS
  Filled 2020-11-19: qty 20

## 2020-11-19 NOTE — Patient Instructions (Signed)
[facility]  Discharge Instructions: Thank you for choosing [facility] to provide your oncology and hematology care.   If you have a lab appointment with the [facility], please go directly to the [facility] and check in at the registration area.   Wear comfortable clothing and clothing appropriate for easy access to any Portacath or PICC line.   We strive to give you quality time with your provider. You may need to reschedule your appointment if you arrive late (15 or more minutes).  Arriving late affects you and other patients whose appointments are after yours.  Also, if you miss three or more appointments without notifying the office, you may be dismissed from the clinic at the provider's discretion.      For prescription refill requests, have your pharmacy contact our office and allow 72 hours for refills to be completed.    Today you received the following chemotherapy and/or immunotherapy agents Tecentriq      To help prevent nausea and vomiting after your treatment, we encourage you to take your nausea medication as directed.  BELOW ARE SYMPTOMS THAT SHOULD BE REPORTED IMMEDIATELY: *FEVER GREATER THAN 100.4 F (38 C) OR HIGHER *CHILLS OR SWEATING *NAUSEA AND VOMITING THAT IS NOT CONTROLLED WITH YOUR NAUSEA MEDICATION *UNUSUAL SHORTNESS OF BREATH *UNUSUAL BRUISING OR BLEEDING *URINARY PROBLEMS (pain or burning when urinating, or frequent urination) *BOWEL PROBLEMS (unusual diarrhea, constipation, pain near the anus) TENDERNESS IN MOUTH AND THROAT WITH OR WITHOUT PRESENCE OF ULCERS (sore throat, sores in mouth, or a toothache) UNUSUAL RASH, SWELLING OR PAIN  UNUSUAL VAGINAL DISCHARGE OR ITCHING   Items with * indicate a potential emergency and should be followed up as soon as possible or go to the Emergency Department if any problems should occur.  Please show the CHEMOTHERAPY ALERT CARD or IMMUNOTHERAPY ALERT CARD at check-in to  the Emergency Department and triage nurse.  Should you have questions after your visit or need to cancel or reschedule your appointment, please contact [facility]  Dept: [phone]  and follow the prompts.  Office hours are 8:00 a.m. to 4:30 p.m. Monday - Friday. Please note that voicemails left after 4:00 p.m. may not be returned until the following business day.  We are closed weekends and major holidays. You have access to a nurse at all times for urgent questions. Please call the main number to the clinic Dept: [phone] and follow the prompts.   For any non-urgent questions, you may also contact your provider using MyChart. We now offer e-Visits for anyone 25 and older to request care online for non-urgent symptoms. For details visit [email or web address].   Also download the MyChart app! Go to the app store, search "MyChart", open the app, select [facility], and log in with your MyChart username and password.  Due to Covid, a mask is required upon entering the hospital/clinic. If you do not have a mask, one will be given to you upon arrival. For doctor visits, patients may have 1 support person aged 64 or older with them. For treatment visits, patients cannot have anyone with them due to current Covid guidelines and our immunocompromised population.   Atezolizumab injection What is this medicine? ATEZOLIZUMAB (a te zoe LIZ ue mab) is a monoclonal antibody. It is used to treat bladder cancer (urothelial cancer), liver cancer, lung cancer, and melanoma. This medicine may be used for other purposes; ask your health care provider or pharmacist if you  have questions. COMMON BRAND NAME(S): Tecentriq What should I tell my health care provider before I take this medicine? They need to know if you have any of these conditions: autoimmune diseases like Crohn's disease, ulcerative colitis, or lupus have had or planning to have an allogeneic stem cell transplant  (uses someone else's stem cells) history of organ transplant history of radiation to the chest nervous system problems like myasthenia gravis or Guillain-Barre syndrome an unusual or allergic reaction to atezolizumab, other medicines, foods, dyes, or preservatives pregnant or trying to get pregnant breast-feeding How should I use this medicine? This medicine is for infusion into a vein. It is given by a health care professional in a hospital or clinic setting. A special MedGuide will be given to you before each treatment. Be sure to read this information carefully each time. Talk to your pediatrician regarding the use of this medicine in children. Special care may be needed. Overdosage: If you think you have taken too much of this medicine contact a poison control center or emergency room at once. NOTE: This medicine is only for you. Do not share this medicine with others. What if I miss a dose? It is important not to miss your dose. Call your doctor or health care professional if you are unable to keep an appointment. What may interact with this medicine? Interactions have not been studied. This list may not describe all possible interactions. Give your health care provider a list of all the medicines, herbs, non-prescription drugs, or dietary supplements you use. Also tell them if you smoke, drink alcohol, or use illegal drugs. Some items may interact with your medicine. What should I watch for while using this medicine? Your condition will be monitored carefully while you are receiving this medicine. You may need blood work done while you are taking this medicine. Do not become pregnant while taking this medicine or for at least 5 months after stopping it. Women should inform their doctor if they wish to become pregnant or think they might be pregnant. There is a potential for serious side effects to an unborn child. Talk to your health care professional or pharmacist for more information. Do  not breast-feed an infant while taking this medicine or for at least 5 months after the last dose. What side effects may I notice from receiving this medicine? Side effects that you should report to your doctor or health care professional as soon as possible: allergic reactions like skin rash, itching or hives, swelling of the face, lips, or tongue black, tarry stools bloody or watery diarrhea breathing problems changes in vision chest pain or chest tightness chills facial flushing fever headache signs and symptoms of high blood sugar such as dizziness; dry mouth; dry skin; fruity breath; nausea; stomach pain; increased hunger or thirst; increased urination signs and symptoms of liver injury like dark yellow or brown urine; general ill feeling or flu-like symptoms; light-colored stools; loss of appetite; nausea; right upper belly pain; unusually weak or tired; yellowing of the eyes or skin stomach pain trouble passing urine or change in the amount of urine Side effects that usually do not require medical attention (report to your doctor or health care professional if they continue or are bothersome): bone pain cough diarrhea joint pain muscle pain muscle weakness swelling of arms or legs tiredness weight loss This list may not describe all possible side effects. Call your doctor for medical advice about side effects. You may report side effects to FDA at 1-800-FDA-1088. Where  should I keep my medicine? This drug is given in a hospital or clinic and will not be stored at home. NOTE: This sheet is a summary. It may not cover all possible information. If you have questions about this medicine, talk to your doctor, pharmacist, or health care provider.  2021 [employer] Standard (2020-01-24 13:59:34)

## 2020-11-28 ENCOUNTER — Other Ambulatory Visit: Payer: Self-pay

## 2020-11-28 ENCOUNTER — Ambulatory Visit (HOSPITAL_COMMUNITY)
Admission: RE | Admit: 2020-11-28 | Discharge: 2020-11-28 | Disposition: A | Discharge status: Home / Self Care | Payer: Medicaid Other | Source: Ambulatory Visit | Attending: Physician Assistant | Admitting: Physician Assistant

## 2020-11-28 DIAGNOSIS — J439 Emphysema, unspecified: Secondary | ICD-10-CM | POA: Diagnosis not present

## 2020-11-28 DIAGNOSIS — D3501 Benign neoplasm of right adrenal gland: Secondary | ICD-10-CM | POA: Diagnosis not present

## 2020-11-28 DIAGNOSIS — I82290 Acute embolism and thrombosis of other thoracic veins: Secondary | ICD-10-CM | POA: Diagnosis not present

## 2020-11-28 DIAGNOSIS — J701 Chronic and other pulmonary manifestations due to radiation: Secondary | ICD-10-CM | POA: Diagnosis not present

## 2020-11-28 DIAGNOSIS — N400 Enlarged prostate without lower urinary tract symptoms: Secondary | ICD-10-CM | POA: Diagnosis not present

## 2020-11-28 DIAGNOSIS — I700 Atherosclerosis of aorta: Secondary | ICD-10-CM | POA: Insufficient documentation

## 2020-11-28 DIAGNOSIS — C3491 Malignant neoplasm of unspecified part of right bronchus or lung: Secondary | ICD-10-CM | POA: Insufficient documentation

## 2020-11-28 DIAGNOSIS — J432 Centrilobular emphysema: Secondary | ICD-10-CM | POA: Diagnosis not present

## 2020-11-28 DIAGNOSIS — C3411 Malignant neoplasm of upper lobe, right bronchus or lung: Secondary | ICD-10-CM | POA: Diagnosis not present

## 2020-11-28 DIAGNOSIS — D1771 Benign lipomatous neoplasm of kidney: Secondary | ICD-10-CM | POA: Insufficient documentation

## 2020-11-28 IMAGING — CT CT CHEST-ABD-PELV W/ CM
2 of 5 series · 12 of 36 positions shown, 14 images · IV contrast (omnipaque)
Comparison: [DATE] CT chest, abdomen and pelvis. [DATE] MRI
abdomen.

CLINICAL DATA: Non-small cell right upper lobe lung adenocarcinoma
status post chemotherapy and radiation therapy with ongoing
immunotherapy. Restaging.

EXAM:
CT CHEST, ABDOMEN, AND PELVIS WITH CONTRAST
TECHNIQUE: Multidetector CT imaging of the chest, abdomen and pelvis was
performed following the standard protocol during bolus
administration of intravenous contrast.
CONTRAST:  85mL OMNIPAQUE IOHEXOL 350 MG/ML SOLN

[Series 2: cap with · axial · 0.78mm/px · z∈[-577,-22]mm · 9 of 139 slices shown, 11 images]
[im 14/139  mediastinal]
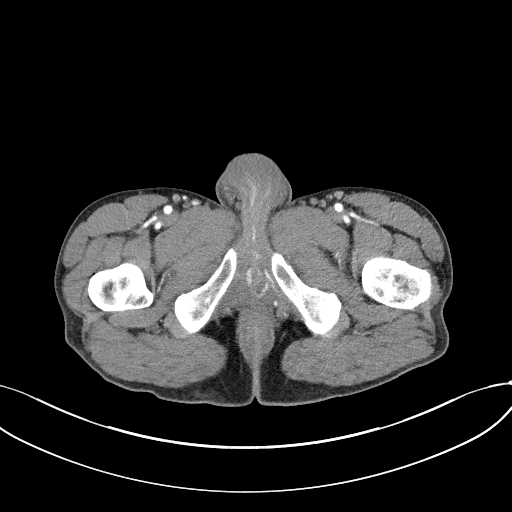
[im 14/139  bone]
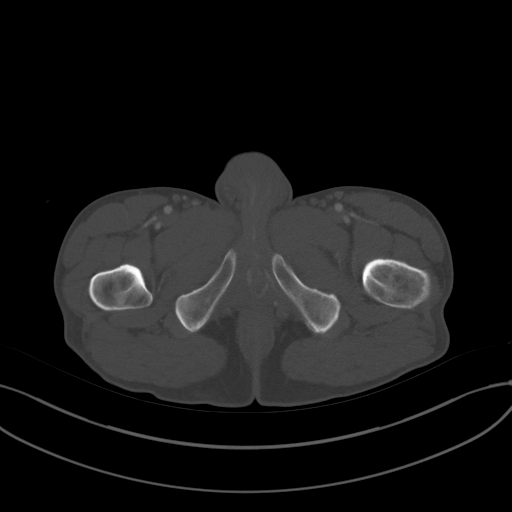
[im 28/139  mediastinal]
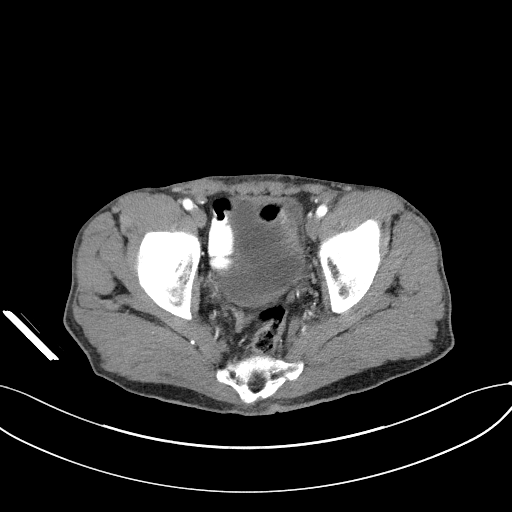
[im 42/139  mediastinal]
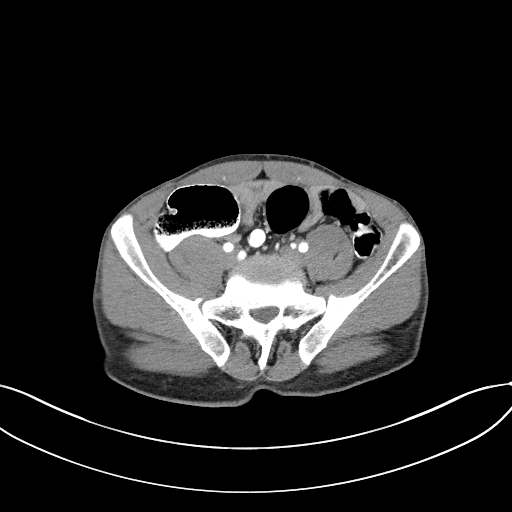
[im 56/139  mediastinal]
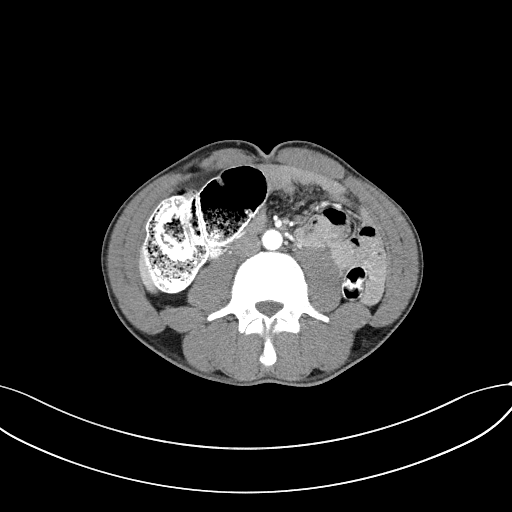
[im 70/139  mediastinal]
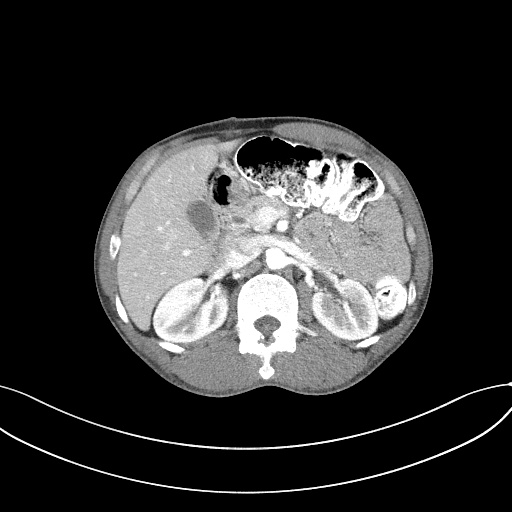
[im 83/139  mediastinal]
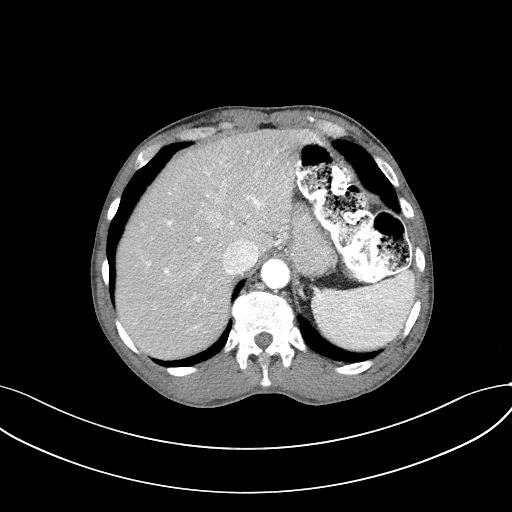
[im 97/139  mediastinal]
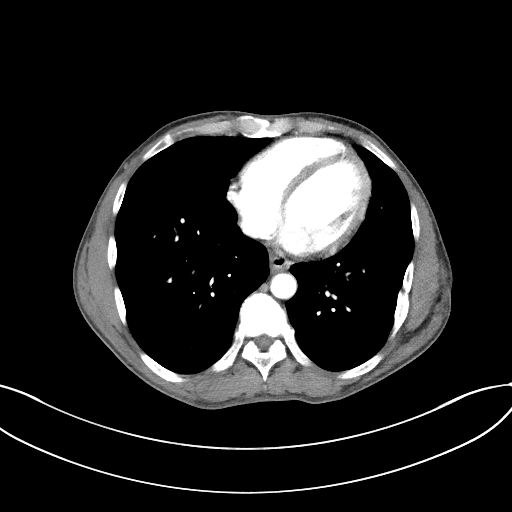
[im 111/139  mediastinal]
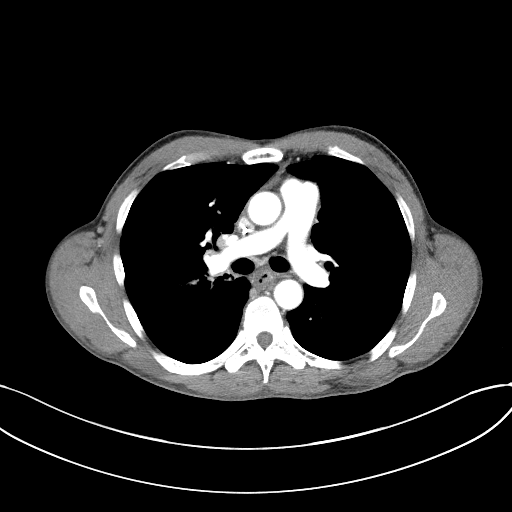
[im 125/139  mediastinal]
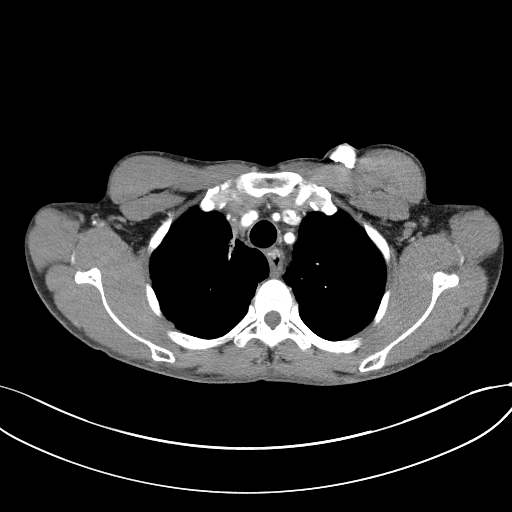
[im 125/139  bone]
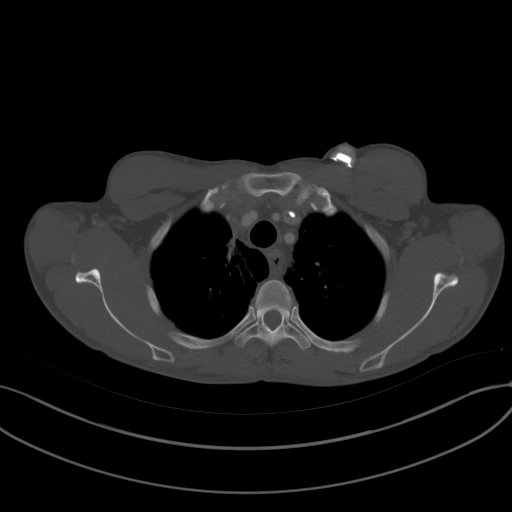

[Series 5: coronals · coronal · 0.88mm/px · 3 of 146 slices shown]
[im 30/146  mediastinal]
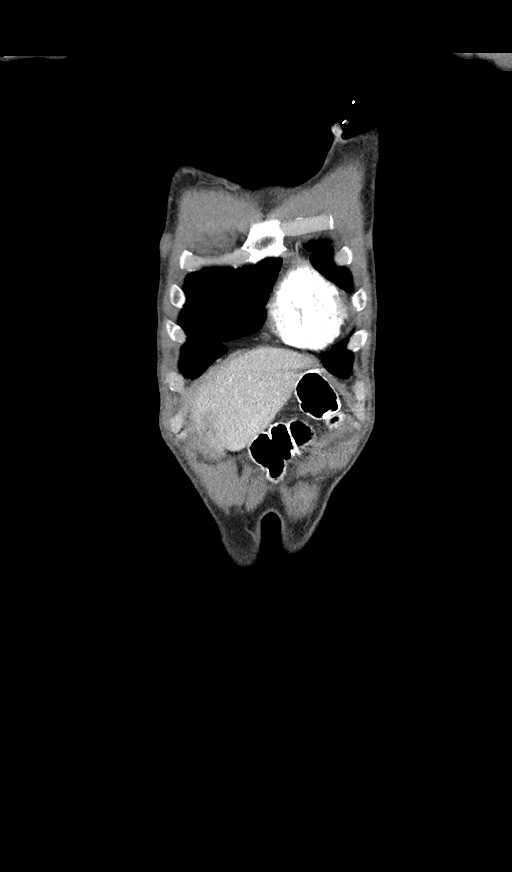
[im 59/146  mediastinal]
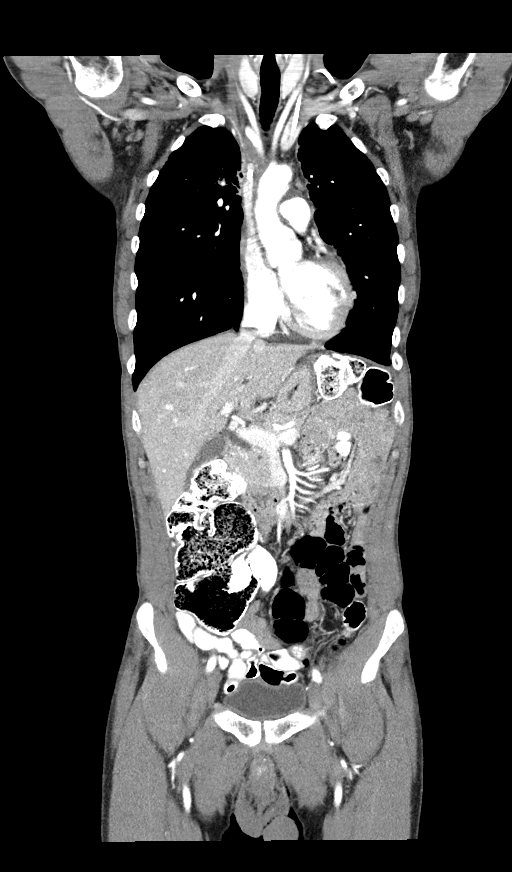
[im 88/146  mediastinal]
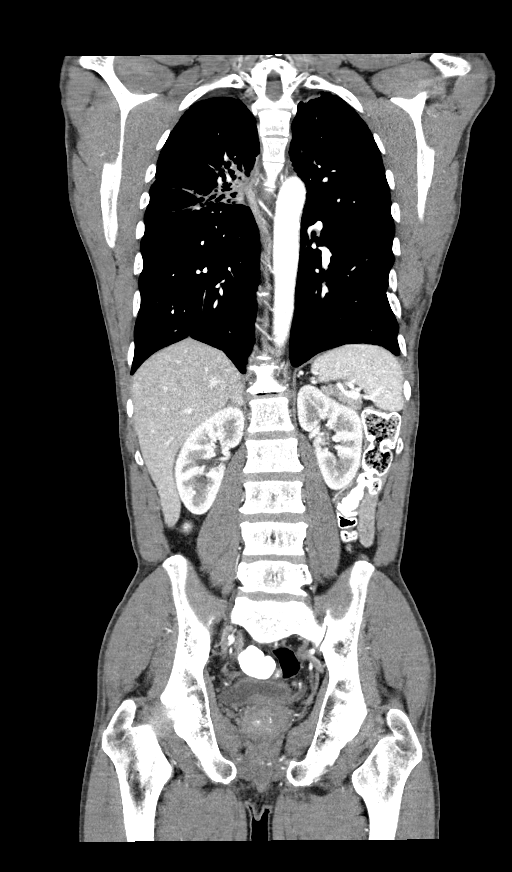

[12 of 36 positions shown; findings below may reference images not displayed]

FINDINGS: CT CHEST FINDINGS

Cardiovascular: Normal heart size. No significant pericardial
effusion/thickening. Left internal jugular Port-A-Cath terminates at
the cavoatrial junction. Atherosclerotic nonaneurysmal thoracic
aorta. Normal caliber pulmonary arteries. No central pulmonary
emboli. Chronic occlusion of the right brachiocephalic vein.

Mediastinum/Nodes: No discrete thyroid nodules. Generalized mild
smooth wall thickening throughout the thoracic esophagus, unchanged.
No axillary adenopathy. Right paratracheal soft tissue measures up
to 1.1 cm short axis diameter (series 2/image 23), stable. No new
pathologically enlarged mediastinal nodes. No pathologically
enlarged hilar nodes. Previously described right hilar 0.8 cm short
axis diameter node measures 0.7 cm (series 2/image 28).

Lungs/Pleura: No pneumothorax. No pleural effusion. Moderate
centrilobular and paraseptal emphysema. Sharply marginated upper
paramediastinal consolidation in both lungs with associated
bronchiectasis, volume loss and distortion, right greater than left,
unchanged, compatible with radiation fibrosis. No acute
consolidative airspace disease, lung masses or significant pulmonary
nodules.

Musculoskeletal:  No aggressive appearing focal osseous lesions.

CT ABDOMEN PELVIS FINDINGS

Hepatobiliary: Normal liver with no liver mass. Normal gallbladder
with no radiopaque cholelithiasis. No biliary ductal dilatation.

Pancreas: Normal, with no mass or duct dilation.

Spleen: Normal size. No mass.

Adrenals/Urinary Tract: Stable 1.0 cm right adrenal nodule with
density 85 HU, characterized as an adenoma on prior MRI. No left
adrenal nodules. No hydronephrosis. Heterogeneous hypodense 1.5 cm
lateral upper right renal cortical lesion (series 7/image 17),
stable and characterized as probable angiomyolipoma on prior MRI. No
additional renal lesions. Normal bladder.

Stomach/Bowel: Normal non-distended stomach. Normal caliber small
bowel with no small bowel wall thickening. Appendix not discretely
visualized. Oral contrast transits to the colon. Normal large bowel
with no diverticulosis, large bowel wall thickening or pericolonic
fat stranding.

Vascular/Lymphatic: Atherosclerotic nonaneurysmal abdominal aorta.
Patent portal, splenic, hepatic and renal veins. No pathologically
enlarged lymph nodes in the abdomen or pelvis.

Reproductive: Mild prostatomegaly.

Other: No pneumoperitoneum, ascites or focal fluid collection.

Musculoskeletal: No aggressive appearing focal osseous lesions.
IMPRESSION: 1. Stable radiation fibrosis in the upper paramediastinal lungs
bilaterally. Stable right paratracheal soft tissue.
2. No new or progressive metastatic disease in the chest, abdomen or
pelvis.
3. Chronic occlusion of the right brachiocephalic vein.
4. Stable right adrenal adenoma.
5. Stable suspected angiomyolipoma in the upper right kidney.
6. Mild prostatomegaly.
7. Aortic Atherosclerosis ([7B]-[7B]) and Emphysema ([7B]-[7B]).

## 2020-11-28 MED ORDER — IOHEXOL 350 MG/ML SOLN
100.0000 mL | Freq: Once | INTRAVENOUS | Status: AC | PRN
  Administered 2020-11-28: 85 mL via INTRAVENOUS

## 2020-11-28 MED ORDER — HEPARIN SOD (PORK) LOCK FLUSH 100 UNIT/ML IV SOLN: 500.0000 [IU] | Freq: Once | INTRAVENOUS | Status: AC

## 2020-11-28 MED ORDER — HEPARIN SOD (PORK) LOCK FLUSH 100 UNIT/ML IV SOLN
INTRAVENOUS | Status: AC
  Administered 2020-11-28: 500 [IU] via INTRAVENOUS
  Filled 2020-11-28: qty 5

## 2020-12-01 NOTE — Progress Notes (Signed)
Contacted pt per Dr Irene Limbo to let patient know CT C/A/P shows no evidence of new or progressive metastatic disease. Findings are stable. Pt acknowledged and verbalized understanding.

## 2020-12-09 NOTE — Progress Notes (Signed)
HEMATOLOGY/ONCOLOGY CLINIC NOTE  Date of Service: 12/09/2020  Patient Care Team: [name], MD as PCP - General (Family Medicine)   CHIEF COMPLAINTS/PURPOSE OF CONSULTATION:  contnued management of Metastatic Poorly differentiated lung adenocarcinoma  HISTORY OF PRESENTING ILLNESS:   David Berry is a 55 year old male with no significant medical history.  He presented to the hospital with progressive right arm and chest pain with new right-sided neck swelling.  The patient states that he was diagnosed with carpal tunnel syndrome received an injection in August.  However, he continued to have progressive pain up and down his right arm that would also radiate to his right chest.  He took ibuprofen with no improvement.  1 week prior to admission, he developed new right sided neck swelling.  He presented to the emergency room for further evaluation.  Work-up in the emergency room was significant for a CT angiogram of the neck and chest which revealed a bulky soft tissue mass in the right neck continuing into the upper chest most compatible with extensive metastatic lymphadenopathy.  This mass measures up to 34 mm and encases multiple vessels including the right upper lobe pulmonary vessels, superior vena cava, and brachiocephalic vessel.  He also had a large poorly defined malignant appearing infiltrative mass measuring 6.2 x 5.6 x 7.9 cm within the right anterior and middle mediastinum with suspected invasion of the distal left brachiocephalic vein and probable tumor occlusion of the right subclavian and jugular veins.  There is also suspected tumor invasion of the upper SVC with string-like narrowing of the SVC but with patency of the SVC just above the right atrium.  The tumor abuts the aorta and great vessels and also results in significant narrowing of the right upper lobe pulmonary arterial vessels.  He also had a CT of the abdomen pelvis which showed a 15 mm heterogeneously enhancing  lesion in the upper pole of the right kidney concerning for renal cell carcinoma, no lymphadenopathy in the abdomen or pelvis, focal hyperenhancement of the anterior left liver most likely related to aberrant venous anatomy/flow, sclerotic lesions in the right acetabulum and left iliac bone are indeterminate.  These are likely bone islands but attention on follow-up is recommended a metastatic disease cannot be excluded.  MRI of the brain with and without contrast did not show any evidence of intracranial metastatic disease. The patient underwent a CT-guided biopsy in interventional radiology on 01/22/2019.  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   When seen today, patient reports ongoing pain and swelling to his right arm and right side of his neck.  Reports ongoing pain to his right chest.  He reports intermittent facial swelling which is worse in the morning and improves throughout the day.  He reports anorexia and a weight loss of 20 pounds over the past few weeks. He has had dizziness for the past few months.  Denies headaches.  He also noted that on the day of admission his peripheral vision was poor.  His vision is now improved.  He denies food getting stuck or difficulty swallowing but does feel as though his throat is "tight."  He reports a nonproductive cough.  Denies fevers and chills.  He is not really having any shortness of breath.  Denies abdominal pain, nausea, vomiting, constipation, diarrhea.  He has not noticed any epistaxis, hemoptysis, hematemesis, hematuria, melena, hematochezia.  The patient is single.  He has 1  daughter who lives in [place], [place].  Reports occasional alcohol use and currently smokes 3 cigars a day since 1997.  He is currently living in a rooming house with other roommates who all have their own room.  They share a common bathroom and kitchen.  Medical oncology was asked see  the patient to make recommendations regarding his newly diagnosed poorly differentiated carcinoma.  History reviewed. No pertinent past medical history.  Current Treatment:  Atezolizumab maintenance   INTERVAL HISTORY:   David Berry. is a 55 y.o. male who is here for evaluation and management of metastatic poorly differentiated lung adenocarcinoma. He is here for C31 of Atezolizumab. The patient's last visit with [place] was on 09/17/2020. The pt reports that he is doing well overall.  The pt reports no acute new focal symptoms. He notes he could eat better and was counseled on dietary intake.  Discussed CT chest/abd/pelvis 7/22/- 1. 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 Stable right paratracheal soft tissue. 2. No new or progressive metastatic disease in the chest, abdomen or pelvis. 3. 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[place])    pt denies this   GERD (gastroesophageal reflux disease)    Lung cancer ([place]) dx'd 01/2019   Peripheral vascular disease ([place])    blood clot in neck Sept 12, 2020    SURGICAL HISTORY: Past Surgical History:  Procedure Laterality Date   APPENDECTOMY     teenager   INGUINAL HERNIA REPAIR Right 2016, 2017   [place], 2018 [facility]-removed mesh   IR IMAGING GUIDED PORT INSERTION  04/26/2019   TOOTH EXTRACTION N/A 03/14/2020   Procedure: DENTAL RESTORATION/EXTRACTIONS;  Surgeon: Diona Browner, DDS;  Location: [place];  Service: Oral Surgery;  Laterality: N/A;    SOCIAL  HISTORY: Social History   Socioeconomic History   Marital status: Single    Spouse name: Not on file   Number of children: 1   Years of education: GED   Highest education level: Some college, no degree  Occupational History    Comment: fork lift driver  Tobacco Use   Smoking status: Former    Types: Cigars    Quit date: 01/20/2019    Years since quitting: 1.8   Smokeless tobacco: Never   Tobacco comments:    smokes 3 black and mild cigars per day  Substance and Sexual Activity   Alcohol use: Not Currently   Drug use: No   Sexual activity: Yes  Other Topics Concern   Not on file  Social History Narrative   Lives alone   Caffeine- coffee, 20 oz daily, tea occas   Social Determinants of Health   Financial Resource Strain: Low Risk    Difficulty of Paying Living Expenses: Not very hard  Food Insecurity: No Food Insecurity   Worried About Running Out of Food in the Last Year: Never true   Ran Out of Food in the Last Year: Never true  Transportation Needs:  No Transportation Needs   Lack of Transportation (Medical): No   Lack of Transportation (Non-Medical): No  Physical Activity: Sufficiently Active   Days of Exercise per Week: 3 days   Minutes of Exercise per Session: 150+ min  Stress: No Stress Concern Present   Feeling of Stress : Not at all  Social Connections: Socially Isolated   Frequency of Communication with Friends and Family: More than three times a week   Frequency of Social Gatherings with Friends and Family: Once a week   Attends Religious Services: Never   Marine scientist or Organizations: No   Attends Music therapist: Not on file   Marital Status: Never married  Human resources officer Violence: Not on file    FAMILY HISTORY: Family History  Problem Relation Age of Onset   Prostate cancer Father     ALLERGIES:  is allergic to pregabalin.  MEDICATIONS:  Current Outpatient Medications  Medication Sig Dispense Refill   apixaban  (ELIQUIS) 5 MG TABS tablet Take 1 tablet (5 mg total) by mouth 2 (two) times daily. 60 tablet 11   esomeprazole (NEXIUM) 40 MG capsule TAKE 1 CAPSULE(40 MG) BY MOUTH DAILY 30 capsule 1   lidocaine-prilocaine (EMLA) cream Apply 1 application topically as needed. 30 g 0   LORazepam (ATIVAN) 0.5 MG tablet Take 1 tablet (0.5 mg total) by mouth every 8 (eight) hours as needed for anxiety. 60 tablet 0   No current facility-administered medications for this visit.   REVIEW OF SYSTEMS:   10 Point review of Systems was done is negative except as noted above.  PHYSICAL EXAMINATION: ECOG FS:1 - Symptomatic but completely ambulatory  Vitals:   12/10/20 0941  BP: 95/62  Pulse: 82  Resp: 17  Temp: 98.2 F (36.8 C)  SpO2: 99%    Wt Readings from Last 3 Encounters:  11/19/20 131 lb 1.9 oz (59.5 kg)  10/29/20 133 lb 6.4 oz (60.5 kg)  10/08/20 131 lb 12 oz (59.8 kg)   There is no height or weight on file to calculate BMI.    NAD GENERAL:alert, in no acute distress and comfortable SKIN: no acute rashes, no significant lesions EYES: conjunctiva are pink and non-injected, sclera anicteric OROPHARYNX: MMM, no exudates, no oropharyngeal erythema or ulceration NECK: supple, no JVD LYMPH:  no palpable lymphadenopathy in the cervical, axillary or inguinal regions LUNGS: clear to auscultation b/l with normal respiratory effort HEART: regular rate & rhythm ABDOMEN:  normoactive bowel sounds , non tender, not distended. Extremity: no pedal edema PSYCH: alert & oriented x 3 with fluent speech NEURO: no focal motor/sensory deficits   LABORATORY DATA:  I have reviewed the data as listed  . CBC Latest Ref Rng & Units 12/10/2020 11/19/2020 10/29/2020  WBC 4.0 - 10.5 K/uL 4.6 4.8 4.2  Hemoglobin 13.0 - 17.0 g/dL 9.9(L) 10.5(L) 10.2(L)  Hematocrit 39.0 - 52.0 % 32.0(L) 34.2(L) 33.2(L)  Platelets 150 - 400 K/uL 337 283 283    . CMP Latest Ref Rng & Units 12/10/2020 11/19/2020 10/29/2020  Glucose 70 - 99  mg/dL 97 100(H) 89  BUN 6 - 20 mg/dL _0 Creatinine 0.61 - 1.24 mg/dL 0.99 0.89 0.96  Sodium 135 - 145 mmol/L 139 141 138  Potassium 3.5 - 5.1 mmol/L 4.2 3.9 4.3  Chloride 98 - 111 mmol/L 105 105 104  CO2 22 - 32 mmol/L _1 Calcium 8.9 - 10.3 mg/dL 9.4 9.5 9.7  Total Protein 6.5 - 8.1 g/dL  6.8 7.1 6.9  Total Bilirubin 0.3 - 1.2 mg/dL <0.2(L) 0.3 0.2(L)  Alkaline Phos 38 - 126 U/L 93 94 83  AST 15 - 41 U/L 14(L) 15 16  ALT 0 - 44 U/L _0 01/22/2019 [facility]: Tumor Mutational Burden     01/22/2019 PD-L1 Immunohistochemistry Analysis    01/22/2019 Soft Tissue Needle Core Biopsy Surgical Pathology     RADIOGRAPHIC STUDIES: I have personally reviewed the radiological images as listed and agreed with the findings in the report. CT CHEST ABDOMEN PELVIS W CONTRAST  Result Date: 11/30/2020 CLINICAL DATA:  Non-small cell right upper lobe lung adenocarcinoma status post chemotherapy and radiation therapy with ongoing immunotherapy. Restaging. EXAM: CT CHEST, ABDOMEN, AND PELVIS WITH CONTRAST TECHNIQUE: Multidetector CT imaging of the chest, abdomen and pelvis was performed following the standard protocol during bolus administration of intravenous contrast. CONTRAST:  77m OMNIPAQUE IOHEXOL 350 MG/ML SOLN COMPARISON:  05/21/2020 CT chest, abdomen and pelvis. 06/18/2020 MRI abdomen. FINDINGS: CT CHEST FINDINGS Cardiovascular: Normal heart size. No significant pericardial effusion/thickening. Left internal jugular Port-A-Cath terminates at the cavoatrial junction. Atherosclerotic nonaneurysmal thoracic aorta. Normal caliber pulmonary arteries. No central pulmonary emboli. 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 Mediastinum/Nodes: No discrete thyroid nodules. Generalized mild smooth wall thickening throughout the thoracic esophagus, unchanged. No axillary adenopathy. Right paratracheal soft tissue measures up to 1.1 cm short axis diameter (series 2/image 23), stable.  No new pathologically enlarged mediastinal nodes. No pathologically enlarged hilar nodes. Previously described right hilar 0.8 cm short axis diameter node measures 0.7 cm (series 2/image 28). Lungs/Pleura: No pneumothorax. No pleural effusion. Moderate centrilobular and paraseptal emphysema. Sharply marginated upper paramediastinal consolidation in both lungs with associated bronchiectasis, volume loss and distortion, right greater than left, unchanged, compatible with radiation fibrosis. No acute consolidative airspace disease, lung masses or significant pulmonary nodules. Musculoskeletal:  No aggressive appearing focal osseous lesions. CT ABDOMEN PELVIS FINDINGS Hepatobiliary: Normal liver with no liver mass. Normal gallbladder with no radiopaque cholelithiasis. No biliary ductal dilatation. Pancreas: Normal, with no mass or duct dilation. Spleen: Normal size. No mass. Adrenals/Urinary Tract: Stable 1.0 cm right adrenal nodule with density 85 HU, characterized as an adenoma on prior MRI. No left adrenal nodules. No hydronephrosis. 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 No additional renal lesions. Normal bladder. Stomach/Bowel: Normal non-distended stomach. Normal caliber small bowel with no small bowel wall thickening. Appendix not discretely visualized. Oral contrast transits to the colon. Normal large bowel with no diverticulosis, large bowel wall thickening or pericolonic fat stranding. Vascular/Lymphatic: Atherosclerotic nonaneurysmal abdominal aorta. Patent portal, splenic, hepatic and renal veins. No pathologically enlarged lymph nodes in the abdomen or pelvis. Reproductive: Mild prostatomegaly. Other: No pneumoperitoneum, ascites or focal fluid collection. Musculoskeletal: No aggressive appearing focal osseous lesions. IMPRESSION: 1. Stable radiation fibrosis in the upper paramediastinal  lungs bilaterally. Stable right paratracheal soft tissue. 2. No new or progressive metastatic disease in the chest, abdomen or pelvis. 3. 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 4. Stable right adrenal adenoma. 5. 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 6. Mild prostatomegaly. 7. Aortic Atherosclerosis (ICD10-I70.0) and Emphysema (ICD10-J43.9). Electronically Signed   By: JIlona SorrelM.D.   On: 11/30/2020 08:58    ASSESSMENT & PLAN:   This is a 55year old male with   1. Metastatic Poorly differentiated lung adenocarcinoma Presented with Large neck mass, mediastinal mass, renal mass, and  questionable bone lesions  no brain mets on MRI brain 01/22/2019 PD-L1 Immunohistochemistry Analysis which revealed "Tumor Proportion Score (TPS) 50%" 05/16/2019 PET/CT ([id number]) revealed "Radiation changes in the right hemithorax, as above. Improving mediastinal nodal metastases. Prior bulky right cervical metastases have resolved. No evidence of metastatic disease in the abdomen/pelvis." 07/19/2019 Esophagus Scan ([id number]) revealed "Mild stricture at the C6-7 level likely related to prior esophageal surgery. Barium tablet was slow to pass through this area but did pass through after 1 minutes. Hiatal hernia with moderate gastroesophageal reflux. Moderate stricture above the hiatal hernia likely due to reflux. Barium tablet did not pass this area. There are changes of esophagitis which are likely due to reflux and possibly radiation as well."  10/10/19 of  CT Chest W Contrast ([id number])- no evidence of lung cancer progression at this time.   2. 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 Symptomatic hemorrhoids  5. Normocytic anemia -Likely due to underlying malignancy   6. S/pThrombocytosis -- likely due to paraneoplastic effect of  tumor and reactive due to inflammation and tissue inflammation from RT.-- now resolved.   7. Nicotine dependence -He was counseled about tobacco cessation.  -has quit since cancer diagnosis.  8. Iron deficiency - likely due to blood loss from hemorrhoids PLAN:  -Discussed pt labwork today, 12/10/2020; reviewed with patient. -CT chest/abd/pelvis --discussed with patient -- no evidence of progression metastatic disease. -Advised pt that he is in no evidence of disease status currently. -Recommended pt drink 48-64 oz of water daily. -The pt has no prohibitive toxicities from continuing Atezolizumab at this time.  -discussed improving po food intake and encouraged increasing po salt intake in context of low BP -PO iron polysaccharide 137m po daily -- will consider IV Iron if needed   FOLLOW UP: Plz schedule next 4 - Atezolizumab treatments with portflush and labs MD visit every other treatment.   The total time spent in the appointment was 20 minutes and more than 50% was on counseling and direct patient cares.  All of the patient's questions were answered with apparent satisfaction. The patient knows to call the clinic with any problems, questions or concerns.   GSullivan LoneMD MRepublicAAHIVMS SRehoboth Mckinley Christian Health Care ServicesCEye Surgery Center Of Northern NevadaHematology/Oncology Physician CPhysicians Care Surgical [facility] (Office):       [phone](Work cell):  [phone](Fax):           [phone] 12/09/2020 7:52 PM  I, RReinaldo Raddle am acting as scribe for Dr. GSullivan Lone MD   .I have reviewed the above documentation for accuracy and completeness, and I agree with the above. GBrunetta GeneraMD

## 2020-12-10 ENCOUNTER — Inpatient Hospital Stay (HOSPITAL_BASED_OUTPATIENT_CLINIC_OR_DEPARTMENT_OTHER): Payer: Medicaid Other | Admitting: Hematology

## 2020-12-10 ENCOUNTER — Inpatient Hospital Stay: Payer: Medicaid Other

## 2020-12-10 ENCOUNTER — Other Ambulatory Visit: Payer: Self-pay

## 2020-12-10 ENCOUNTER — Telehealth: Payer: Self-pay | Admitting: Hematology

## 2020-12-10 ENCOUNTER — Inpatient Hospital Stay: Payer: Medicaid Other | Attending: Hematology

## 2020-12-10 VITALS — BP 95/62 | HR 82 | Temp 98.2°F | Resp 17 | Ht 71.0 in | Wt 133.3 lb

## 2020-12-10 DIAGNOSIS — C3491 Malignant neoplasm of unspecified part of right bronchus or lung: Secondary | ICD-10-CM | POA: Diagnosis not present

## 2020-12-10 DIAGNOSIS — D509 Iron deficiency anemia, unspecified: Secondary | ICD-10-CM | POA: Insufficient documentation

## 2020-12-10 DIAGNOSIS — C778 Secondary and unspecified malignant neoplasm of lymph nodes of multiple regions: Secondary | ICD-10-CM | POA: Diagnosis present

## 2020-12-10 DIAGNOSIS — Z86718 Personal history of other venous thrombosis and embolism: Secondary | ICD-10-CM | POA: Insufficient documentation

## 2020-12-10 DIAGNOSIS — Z5112 Encounter for antineoplastic immunotherapy: Secondary | ICD-10-CM | POA: Diagnosis present

## 2020-12-10 DIAGNOSIS — Z79899 Other long term (current) drug therapy: Secondary | ICD-10-CM | POA: Insufficient documentation

## 2020-12-10 DIAGNOSIS — Z95828 Presence of other vascular implants and grafts: Secondary | ICD-10-CM

## 2020-12-10 DIAGNOSIS — Z7901 Long term (current) use of anticoagulants: Secondary | ICD-10-CM | POA: Diagnosis not present

## 2020-12-10 DIAGNOSIS — Z87891 Personal history of nicotine dependence: Secondary | ICD-10-CM | POA: Insufficient documentation

## 2020-12-10 DIAGNOSIS — C3490 Malignant neoplasm of unspecified part of unspecified bronchus or lung: Secondary | ICD-10-CM | POA: Insufficient documentation

## 2020-12-10 DIAGNOSIS — Z7189 Other specified counseling: Secondary | ICD-10-CM

## 2020-12-10 LAB — CMP (CANCER CENTER ONLY)
ALT: 9 U/L (ref 0–44)
AST: 14 U/L — ABNORMAL LOW (ref 15–41)
Albumin: 3.7 g/dL (ref 3.5–5.0)
Alkaline Phosphatase: 93 U/L (ref 38–126)
Anion gap: 8 (ref 5–15)
BUN: 9 mg/dL (ref 6–20)
CO2: 26 mmol/L (ref 22–32)
Calcium: 9.4 mg/dL (ref 8.9–10.3)
Chloride: 105 mmol/L (ref 98–111)
Creatinine: 0.99 mg/dL (ref 0.61–1.24)
GFR, Estimated: >60 mL/min (ref >60)
Glucose, Bld: 97 mg/dL (ref 70–99)
Potassium: 4.2 mmol/L (ref 3.5–5.1)
Sodium: 139 mmol/L (ref 135–145)
Total Bilirubin: <0.2 mg/dL — ABNORMAL LOW (ref 0.3–1.2)
Total Protein: 6.8 g/dL (ref 6.5–8.1)

## 2020-12-10 LAB — CBC WITH DIFFERENTIAL/PLATELET
Abs Immature Granulocytes: 0.01 10*3/uL (ref 0.00–0.07)
Basophils Absolute: 0 10*3/uL (ref 0.0–0.1)
Basophils Relative: 1 %
Eosinophils Absolute: 0.1 10*3/uL (ref 0.0–0.5)
Eosinophils Relative: 2 %
HCT: 32 % — ABNORMAL LOW (ref 39.0–52.0)
Hemoglobin: 9.9 g/dL — ABNORMAL LOW (ref 13.0–17.0)
Immature Granulocytes: 0 %
Lymphocytes Relative: 25 %
Lymphs Abs: 1.2 10*3/uL (ref 0.7–4.0)
MCH: 24 pg — ABNORMAL LOW (ref 26.0–34.0)
MCHC: 30.9 g/dL (ref 30.0–36.0)
MCV: 77.5 fL — ABNORMAL LOW (ref 80.0–100.0)
Monocytes Absolute: 0.7 10*3/uL (ref 0.1–1.0)
Monocytes Relative: 16 %
Neutro Abs: 2.6 10*3/uL (ref 1.7–7.7)
Neutrophils Relative %: 56 %
Platelets: 337 10*3/uL (ref 150–400)
RBC: 4.13 MIL/uL — ABNORMAL LOW (ref 4.22–5.81)
RDW: 15.2 % (ref 11.5–15.5)
WBC: 4.6 10*3/uL (ref 4.0–10.5)
nRBC: 0 % (ref 0.0–0.2)

## 2020-12-10 MED ORDER — SODIUM CHLORIDE 0.9 % IV SOLN
Freq: Once | INTRAVENOUS | Status: AC
  Filled 2020-12-10: qty 250

## 2020-12-10 MED ORDER — HEPARIN SOD (PORK) LOCK FLUSH 100 UNIT/ML IV SOLN
500.0000 [IU] | Freq: Once | INTRAVENOUS | Status: AC | PRN
  Administered 2020-12-10: 500 [IU]
  Filled 2020-12-10: qty 5

## 2020-12-10 MED ORDER — FAMOTIDINE 20 MG PO TABS
ORAL_TABLET | ORAL | Status: AC
  Filled 2020-12-10: qty 1

## 2020-12-10 MED ORDER — SODIUM CHLORIDE 0.9% FLUSH
10.0000 mL | INTRAVENOUS | Status: DC | PRN
  Administered 2020-12-10: 10 mL
  Filled 2020-12-10: qty 10

## 2020-12-10 MED ORDER — SODIUM CHLORIDE 0.9 % IV SOLN
1200.0000 mg | Freq: Once | INTRAVENOUS | Status: AC
  Administered 2020-12-10: 1200 mg via INTRAVENOUS
  Filled 2020-12-10: qty 20

## 2020-12-10 MED ORDER — FAMOTIDINE 20 MG PO TABS
20.0000 mg | ORAL_TABLET | Freq: Once | ORAL | Status: AC
  Administered 2020-12-10: 20 mg via ORAL

## 2020-12-10 MED ORDER — ACETAMINOPHEN 325 MG PO TABS
ORAL_TABLET | ORAL | Status: AC
  Filled 2020-12-10: qty 2

## 2020-12-10 MED ORDER — DIPHENHYDRAMINE HCL 25 MG PO TABS
25.0000 mg | ORAL_TABLET | Freq: Once | ORAL | Status: AC
  Administered 2020-12-10: 25 mg via ORAL
  Filled 2020-12-10: qty 1

## 2020-12-10 MED ORDER — DIPHENHYDRAMINE HCL 25 MG PO CAPS
ORAL_CAPSULE | ORAL | Status: AC
  Filled 2020-12-10: qty 1

## 2020-12-10 MED ORDER — ACETAMINOPHEN 325 MG PO TABS
650.0000 mg | ORAL_TABLET | Freq: Once | ORAL | Status: AC
  Administered 2020-12-10: 650 mg via ORAL

## 2020-12-10 NOTE — Patient Instructions (Signed)
[facility]COL[facility]  Discharge Instructions: Thank you for choosing [facility] to provide your oncology and hematology care.   If you have a lab appointment with the [facility], please go directly to the [facility] and check in at the registration area.   Wear comfortable clothing and clothing appropriate for easy access to any Portacath or PICC line.   We strive to give you quality time with your provider. You may need to reschedule your appointment if you arrive late (15 or more minutes).  Arriving late affects you and other patients whose appointments are after yours.  Also, if you miss three or more appointments without notifying the office, you may be dismissed from the clinic at the provider's discretion.      For prescription refill requests, have your pharmacy contact our office and allow 72 hours for refills to be completed.    Today you received the following chemotherapy and/or immunotherapy agents Tecentriq      To help prevent nausea and vomiting after your treatment, we encourage you to take your nausea medication as directed.  BELOW ARE SYMPTOMS THAT SHOULD BE REPORTED IMMEDIATELY: *FEVER GREATER THAN 100.4 F (38 C) OR HIGHER *CHILLS OR SWEATING *NAUSEA AND VOMITING THAT IS NOT CONTROLLED WITH YOUR NAUSEA MEDICATION *UNUSUAL SHORTNESS OF BREATH *UNUSUAL BRUISING OR BLEEDING *URINARY PROBLEMS (pain or burning when urinating, or frequent urination) *BOWEL PROBLEMS (unusual diarrhea, constipation, pain near the anus) TENDERNESS IN MOUTH AND THROAT WITH OR WITHOUT PRESENCE OF ULCERS (sore throat, sores in mouth, or a toothache) UNUSUAL RASH, SWELLING OR PAIN  UNUSUAL VAGINAL DISCHARGE OR ITCHING   Items with * indicate a potential emergency and should be followed up as soon as possible or go to the Emergency Department if any problems should occur.  Please show the CHEMOTHERAPY ALERT CARD or IMMUNOTHERAPY ALERT CARD at check-in to  the Emergency Department and triage nurse.  Should you have questions after your visit or need to cancel or reschedule your appointment, please contact [facility]  Dept: [phone]  and follow the prompts.  Office hours are 8:00 a.m. to 4:30 p.m. Monday - Friday. Please note that voicemails left after 4:00 p.m. may not be returned until the following business day.  We are closed weekends and major holidays. You have access to a nurse at all times for urgent questions. Please call the main number to the clinic Dept: [phone] and follow the prompts.   For any non-urgent questions, you may also contact your provider using MyChart. We now offer e-Visits for anyone 27 and older to request care online for non-urgent symptoms. For details visit [email or web address].   Also download the MyChart app! Go to the app store, search "MyChart", open the app, select [facility], and log in with your MyChart username and password.  Due to Covid, a mask is required upon entering the hospital/clinic. If you do not have a mask, one will be given to you upon arrival. For doctor visits, patients may have 1 support person aged 40 or older with them. For treatment visits, patients cannot have anyone with them due to current Covid guidelines and our immunocompromised population.   Atezolizumab injection What is this medicine? ATEZOLIZUMAB (a te zoe LIZ ue mab) is a monoclonal antibody. It is used to treat bladder cancer (urothelial cancer), liver cancer, lung cancer, and melanoma. This medicine may be used for other purposes; ask your health care provider or pharmacist if you  have questions. COMMON BRAND NAME(S): Tecentriq What should I tell my health care provider before I take this medicine? They need to know if you have any of these conditions: autoimmune diseases like Crohn's disease, ulcerative colitis, or lupus have had or planning to have an allogeneic stem cell transplant  (uses someone else's stem cells) history of organ transplant history of radiation to the chest nervous system problems like myasthenia gravis or Guillain-Barre syndrome an unusual or allergic reaction to atezolizumab, other medicines, foods, dyes, or preservatives pregnant or trying to get pregnant breast-feeding How should I use this medicine? This medicine is for infusion into a vein. It is given by a health care professional in a hospital or clinic setting. A special MedGuide will be given to you before each treatment. Be sure to read this information carefully each time. Talk to your pediatrician regarding the use of this medicine in children. Special care may be needed. Overdosage: If you think you have taken too much of this medicine contact a poison control center or emergency room at once. NOTE: This medicine is only for you. Do not share this medicine with others. What if I miss a dose? It is important not to miss your dose. Call your doctor or health care professional if you are unable to keep an appointment. What may interact with this medicine? Interactions have not been studied. This list may not describe all possible interactions. Give your health care provider a list of all the medicines, herbs, non-prescription drugs, or dietary supplements you use. Also tell them if you smoke, drink alcohol, or use illegal drugs. Some items may interact with your medicine. What should I watch for while using this medicine? Your condition will be monitored carefully while you are receiving this medicine. You may need blood work done while you are taking this medicine. Do not become pregnant while taking this medicine or for at least 5 months after stopping it. Women should inform their doctor if they wish to become pregnant or think they might be pregnant. There is a potential for serious side effects to an unborn child. Talk to your health care professional or pharmacist for more information. Do  not breast-feed an infant while taking this medicine or for at least 5 months after the last dose. What side effects may I notice from receiving this medicine? Side effects that you should report to your doctor or health care professional as soon as possible: allergic reactions like skin rash, itching or hives, swelling of the face, lips, or tongue black, tarry stools bloody or watery diarrhea breathing problems changes in vision chest pain or chest tightness chills facial flushing fever headache signs and symptoms of high blood sugar such as dizziness; dry mouth; dry skin; fruity breath; nausea; stomach pain; increased hunger or thirst; increased urination signs and symptoms of liver injury like dark yellow or brown urine; general ill feeling or flu-like symptoms; light-colored stools; loss of appetite; nausea; right upper belly pain; unusually weak or tired; yellowing of the eyes or skin stomach pain trouble passing urine or change in the amount of urine Side effects that usually do not require medical attention (report to your doctor or health care professional if they continue or are bothersome): bone pain cough diarrhea joint pain muscle pain muscle weakness swelling of arms or legs tiredness weight loss This list may not describe all possible side effects. Call your doctor for medical advice about side effects. You may report side effects to FDA at 1-800-FDA-1088. Where  should I keep my medicine? This drug is given in a hospital or clinic and will not be stored at home. NOTE: This sheet is a summary. It may not cover all possible information. If you have questions about this medicine, talk to your doctor, pharmacist, or health care provider.  2021 Elsevier/Gold Standard (2020-01-24 13:59:34)

## 2020-12-10 NOTE — Telephone Encounter (Signed)
Scheduled follow-up appointments per 8/3 los. Patient is aware.

## 2020-12-18 ENCOUNTER — Encounter: Payer: Self-pay | Admitting: Hematology

## 2020-12-18 MED ORDER — POLYSACCHARIDE IRON COMPLEX 150 MG PO CAPS: 150.0000 mg | ORAL_CAPSULE | Freq: Every day | ORAL | 3 refills | Status: DC

## 2020-12-23 NOTE — Progress Notes (Signed)
Attempted to contact pt to let him know per Dr Irene Limbo:   let patient know his iron level are low-- I have sent po iron polysaccharide to his pharmacy to start taking.    Left message for pt with above information.

## 2020-12-31 ENCOUNTER — Inpatient Hospital Stay: Payer: Medicaid Other

## 2020-12-31 ENCOUNTER — Other Ambulatory Visit: Payer: Self-pay

## 2020-12-31 VITALS — Wt 132.2 lb

## 2020-12-31 VITALS — BP 102/61 | HR 85 | Temp 98.6°F | Resp 18

## 2020-12-31 DIAGNOSIS — C3491 Malignant neoplasm of unspecified part of right bronchus or lung: Secondary | ICD-10-CM

## 2020-12-31 DIAGNOSIS — Z7189 Other specified counseling: Secondary | ICD-10-CM

## 2020-12-31 DIAGNOSIS — Z5112 Encounter for antineoplastic immunotherapy: Secondary | ICD-10-CM | POA: Diagnosis not present

## 2020-12-31 DIAGNOSIS — Z95828 Presence of other vascular implants and grafts: Secondary | ICD-10-CM

## 2020-12-31 LAB — CBC WITH DIFFERENTIAL/PLATELET
Abs Immature Granulocytes: 0.01 10*3/uL (ref 0.00–0.07)
Basophils Absolute: 0 10*3/uL (ref 0.0–0.1)
Basophils Relative: 1 %
Eosinophils Absolute: 0.1 10*3/uL (ref 0.0–0.5)
Eosinophils Relative: 1 %
HCT: 32.8 % — ABNORMAL LOW (ref 39.0–52.0)
Hemoglobin: 9.8 g/dL — ABNORMAL LOW (ref 13.0–17.0)
Immature Granulocytes: 0 %
Lymphocytes Relative: 24 %
Lymphs Abs: 1.1 10*3/uL (ref 0.7–4.0)
MCH: 23.4 pg — ABNORMAL LOW (ref 26.0–34.0)
MCHC: 29.9 g/dL — ABNORMAL LOW (ref 30.0–36.0)
MCV: 78.5 fL — ABNORMAL LOW (ref 80.0–100.0)
Monocytes Absolute: 0.6 10*3/uL (ref 0.1–1.0)
Monocytes Relative: 14 %
Neutro Abs: 2.8 10*3/uL (ref 1.7–7.7)
Neutrophils Relative %: 60 %
Platelets: 266 10*3/uL (ref 150–400)
RBC: 4.18 MIL/uL — ABNORMAL LOW (ref 4.22–5.81)
RDW: 15 % (ref 11.5–15.5)
WBC: 4.7 10*3/uL (ref 4.0–10.5)
nRBC: 0 % (ref 0.0–0.2)

## 2020-12-31 LAB — CMP (CANCER CENTER ONLY)
ALT: 9 U/L (ref 0–44)
AST: 14 U/L — ABNORMAL LOW (ref 15–41)
Albumin: 3.9 g/dL (ref 3.5–5.0)
Alkaline Phosphatase: 97 U/L (ref 38–126)
Anion gap: 7 (ref 5–15)
BUN: 10 mg/dL (ref 6–20)
CO2: 26 mmol/L (ref 22–32)
Calcium: 9.6 mg/dL (ref 8.9–10.3)
Chloride: 106 mmol/L (ref 98–111)
Creatinine: 1.1 mg/dL (ref 0.61–1.24)
GFR, Estimated: >60 mL/min (ref >60)
Glucose, Bld: 103 mg/dL — ABNORMAL HIGH (ref 70–99)
Potassium: 4.4 mmol/L (ref 3.5–5.1)
Sodium: 139 mmol/L (ref 135–145)
Total Bilirubin: 0.3 mg/dL (ref 0.3–1.2)
Total Protein: 7.2 g/dL (ref 6.5–8.1)

## 2020-12-31 MED ORDER — DIPHENHYDRAMINE HCL 25 MG PO CAPS
25.0000 mg | ORAL_CAPSULE | Freq: Once | ORAL | Status: AC
  Administered 2020-12-31: 25 mg via ORAL

## 2020-12-31 MED ORDER — SODIUM CHLORIDE 0.9 % IV SOLN: Freq: Once | INTRAVENOUS | Status: AC

## 2020-12-31 MED ORDER — SODIUM CHLORIDE 0.9 % IV SOLN
1200.0000 mg | Freq: Once | INTRAVENOUS | Status: AC
  Administered 2020-12-31: 1200 mg via INTRAVENOUS
  Filled 2020-12-31: qty 20

## 2020-12-31 MED ORDER — HEPARIN SOD (PORK) LOCK FLUSH 100 UNIT/ML IV SOLN
500.0000 [IU] | Freq: Once | INTRAVENOUS | Status: AC | PRN
  Administered 2020-12-31: 500 [IU]

## 2020-12-31 MED ORDER — ACETAMINOPHEN 325 MG PO TABS
650.0000 mg | ORAL_TABLET | Freq: Once | ORAL | Status: AC
  Administered 2020-12-31: 650 mg via ORAL
  Filled 2020-12-31: qty 2

## 2020-12-31 MED ORDER — SODIUM CHLORIDE 0.9% FLUSH
10.0000 mL | INTRAVENOUS | Status: DC | PRN
  Administered 2020-12-31: 10 mL

## 2020-12-31 MED ORDER — DIPHENHYDRAMINE HCL 25 MG PO CAPS
ORAL_CAPSULE | ORAL | Status: AC
  Filled 2020-12-31: qty 1

## 2020-12-31 MED ORDER — DIPHENHYDRAMINE HCL 25 MG PO TABS
25.0000 mg | ORAL_TABLET | Freq: Once | ORAL | Status: DC
  Filled 2020-12-31: qty 1

## 2020-12-31 MED ORDER — FAMOTIDINE 20 MG PO TABS
20.0000 mg | ORAL_TABLET | Freq: Once | ORAL | Status: AC
  Administered 2020-12-31: 20 mg via ORAL
  Filled 2020-12-31: qty 1

## 2020-12-31 NOTE — Patient Instructions (Signed)
[facility] ONCOLOGY  Discharge Instructions: Thank you for choosing [facility] to provide your oncology and hematology care.   If you have a lab appointment with the [facility], please go directly to the [facility] and check in at the registration area.   Wear comfortable clothing and clothing appropriate for easy access to any Portacath or PICC line.   We strive to give you quality time with your provider. You may need to reschedule your appointment if you arrive late (15 or more minutes).  Arriving late affects you and other patients whose appointments are after yours.  Also, if you miss three or more appointments without notifying the office, you may be dismissed from the clinic at the provider's discretion.      For prescription refill requests, have your pharmacy contact our office and allow 72 hours for refills to be completed.    Today you received the following chemotherapy and/or immunotherapy agents: Tecentriq   To help prevent nausea and vomiting after your treatment, we encourage you to take your nausea medication as directed.  BELOW ARE SYMPTOMS THAT SHOULD BE REPORTED IMMEDIATELY: *FEVER GREATER THAN 100.4 F (38 C) OR HIGHER *CHILLS OR SWEATING *NAUSEA AND VOMITING THAT IS NOT CONTROLLED WITH YOUR NAUSEA MEDICATION *UNUSUAL SHORTNESS OF BREATH *UNUSUAL BRUISING OR BLEEDING *URINARY PROBLEMS (pain or burning when urinating, or frequent urination) *BOWEL PROBLEMS (unusual diarrhea, constipation, pain near the anus) TENDERNESS IN MOUTH AND THROAT WITH OR WITHOUT PRESENCE OF ULCERS (sore throat, sores in mouth, or a toothache) UNUSUAL RASH, SWELLING OR PAIN  UNUSUAL VAGINAL DISCHARGE OR ITCHING   Items with * indicate a potential emergency and should be followed up as soon as possible or go to the Emergency Department if any problems should occur.  Please show the CHEMOTHERAPY ALERT CARD or IMMUNOTHERAPY ALERT CARD at check-in to the  Emergency Department and triage nurse.  Should you have questions after your visit or need to cancel or reschedule your appointment, please contact [facility]  Dept: [phone]  and follow the prompts.  Office hours are 8:00 a.m. to 4:30 p.m. Monday - Friday. Please note that voicemails left after 4:00 p.m. may not be returned until the following business day.  We are closed weekends and major holidays. You have access to a nurse at all times for urgent questions. Please call the main number to the clinic Dept: [phone] and follow the prompts.   For any non-urgent questions, you may also contact your provider using MyChart. We now offer e-Visits for anyone 54 and older to request care online for non-urgent symptoms. For details visit [email or web address].   Also download the MyChart app! Go to the app store, search "MyChart", open the app, select [facility], and log in with your MyChart username and password.  Due to Covid, a mask is required upon entering the hospital/clinic. If you do not have a mask, one will be given to you upon arrival. For doctor visits, patients may have 1 support person aged 54 or older with them. For treatment visits, patients cannot have anyone with them due to current Covid guidelines and our immunocompromised population.

## 2021-01-14 ENCOUNTER — Encounter (HOSPITAL_COMMUNITY): Payer: Self-pay | Admitting: Emergency Medicine

## 2021-01-14 ENCOUNTER — Ambulatory Visit (HOSPITAL_COMMUNITY)
Admission: EM | Admit: 2021-01-14 | Discharge: 2021-01-14 | Disposition: A | Discharge status: Home / Self Care | Payer: Medicaid Other | Attending: Family Medicine | Admitting: Family Medicine

## 2021-01-14 DIAGNOSIS — S46911A Strain of unspecified muscle, fascia and tendon at shoulder and upper arm level, right arm, initial encounter: Secondary | ICD-10-CM | POA: Diagnosis not present

## 2021-01-14 MED ORDER — PREDNISONE 20 MG PO TABS: 40.0000 mg | ORAL_TABLET | Freq: Every day | ORAL | 0 refills | Status: DC

## 2021-01-14 MED ORDER — CYCLOBENZAPRINE HCL 5 MG PO TABS: 5.0000 mg | ORAL_TABLET | Freq: Three times a day (TID) | ORAL | 0 refills | Status: DC | PRN

## 2021-01-14 MED ORDER — DEXAMETHASONE SODIUM PHOSPHATE 10 MG/ML IJ SOLN: 10.0000 mg | Freq: Once | INTRAMUSCULAR | Status: DC

## 2021-01-14 NOTE — ED Triage Notes (Signed)
Pt is present today with right shoulder pain. Pt sates that he uses that arm a lot at work and noticed the pain two weeks ago. Pt states that it is hard to move his arm or lift it.

## 2021-01-17 NOTE — ED Provider Notes (Signed)
[facility]    CSN: [id number] Arrival date & time: 01/14/21  5284      History   Chief Complaint Chief Complaint  Patient presents with   Shoulder Pain    HPI David Berry. is a 55 y.o. male.   Patient presenting today with severe right shoulder pain that he first noticed 2 weeks ago.  He states that he does a lot of heavy lifting at work and feels like this makes it much worse.  It is currently difficult to lift this arm to do his normal daily tasks.  Denies numbness, tingling, pain radiating down the arm, swelling, discoloration.  No known traumatic injury that he is aware of.  Has been trying ibuprofen with temporary relief thus far. Of note, patient takes eliquis for hx of DVT.    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[place])    pt denies this   GERD (gastroesophageal reflux disease)    Lung cancer ([place]) dx'd 01/2019   Peripheral vascular disease ([place])    blood clot in neck Sept 12, 2020    Patient Active Problem List   Diagnosis Date Noted   Encounter for immunotherapy 10/29/2020   Microcytic anemia 10/29/2020   Gastroesophageal reflux disease 10/02/2020   Hypotension due to hypovolemia 10/02/2020   Left inguinal pain 10/02/2020   Port-A-Cath in place 06/13/2019   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/26/2019   Former smoker 02/26/2019   Adenocarcinoma of right lung ([place]) 02/05/2019   Counseling regarding advance care planning and goals of care 02/05/2019   Acute deep vein thrombosis (DVT) of non-extremity vein    Adenocarcinoma of lung ([place])    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   Internal jugular (IJ) vein thromboembolism, acute, right ([place]) 01/24/2019   Mass of right side of neck 01/24/2019   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   Right renal mass 01/24/2019   Hip mass, right 01/24/2019   Thyromegaly 01/24/2019   Extravasation injury of IV catheter  site with other complication ([place])    Skin rash    BACK PAIN, LUMBAR 03/24/2010   LUMBAR RADICULOPATHY 03/24/2010    Past Surgical History:  Procedure Laterality Date   APPENDECTOMY     teenager   INGUINAL HERNIA REPAIR Right 2016, 2017   [place], 2018 [facility]-removed mesh   IR IMAGING GUIDED PORT INSERTION  04/26/2019   TOOTH EXTRACTION N/A 03/14/2020   Procedure: DENTAL RESTORATION/EXTRACTIONS;  Surgeon: Diona Browner, DDS;  Location: [facility];  Service: Oral Surgery;  Laterality: N/A;       Home Medications    Prior to Admission medications   Medication Sig Start Date End Date Taking? Authorizing Provider  cyclobenzaprine (FLEXERIL) 5 MG tablet Take 1 tablet (5 mg total) by mouth 3 (three) times daily as needed for muscle spasms. Do not drink alcohol or drive while taking this medication.  May cause drowsiness. 01/14/21  Yes Volney American, PA-C  predniSONE (DELTASONE) 20 MG tablet Take 2 tablets (40 mg total) by mouth daily with breakfast. 01/14/21  Yes Volney American, PA-C  apixaban (ELIQUIS) 5 MG TABS tablet Take 1 tablet (5 mg total) by mouth 2 (two) times daily. 06/25/20   Brunetta Genera, MD  esomeprazole (NEXIUM) 40 MG capsule TAKE 1 CAPSULE(40 MG) BY MOUTH DAILY 05/19/20   Brunetta Genera, MD  iron polysaccharides (NIFEREX) 150 MG capsule Take 1 capsule (150 mg total) by mouth daily. 12/18/20   Brunetta Genera, MD  lidocaine-prilocaine (EMLA) cream Apply 1 application topically as needed. 10/29/20   Lincoln Brigham, PA-C  LORazepam (ATIVAN) 0.5 MG tablet Take 1 tablet (0.5 mg total) by mouth every 8 (eight) hours as needed for anxiety. 06/25/20   Brunetta Genera, MD    Family History Family History  Problem Relation Age of Onset   Prostate cancer Father     Social History Social History   Tobacco Use   Smoking status: Former    Types: Cigars    Quit date: 01/20/2019    Years since quitting: 1.9   Smokeless tobacco: Never    Tobacco comments:    smokes 3 black and mild cigars per day  Substance Use Topics   Alcohol use: Not Currently   Drug use: No     Allergies   Pregabalin   Review of Systems Review of Systems Per HPI  Physical Exam Triage Vital Signs ED Triage Vitals  Enc Vitals Group     BP 01/14/21 1008 106/71     Pulse Rate 01/14/21 1008 77     Resp --      Temp 01/14/21 1008 98.7 F (37.1 C)     Temp Source 01/14/21 1008 Oral     SpO2 01/14/21 1008 98 %     Weight --      Height --      Head Circumference --      Peak Flow --      Pain Score 01/14/21 1013 7     Pain Loc --      Pain Edu? --      Excl. in [place]? --    No data found.  Updated Vital Signs BP 106/71 (BP Location: Left Arm)   Pulse 77   Temp 98.7 F (37.1 C) (Oral)   SpO2 98%   Visual Acuity Right Eye Distance:   Left Eye Distance:   Bilateral Distance:    Right Eye Near:   Left Eye Near:    Bilateral Near:     Physical Exam Vitals and nursing note reviewed.  Constitutional:      Appearance: Normal appearance.  HENT:     Head: Atraumatic.     Nose: Nose normal.     Mouth/Throat:     Mouth: Mucous membranes are moist.     Pharynx: Oropharynx is clear.  Eyes:     Extraocular Movements: Extraocular movements intact.     Conjunctiva/sclera: Conjunctivae normal.  Cardiovascular:     Rate and Rhythm: Normal rate and regular rhythm.  Pulmonary:     Effort: Pulmonary effort is normal. No respiratory distress.     Breath sounds: Normal breath sounds. No wheezing.  Musculoskeletal:        General: Tenderness present. No swelling or deformity.     Cervical back: Normal range of motion and neck supple.     Comments: Significant anterior tenderness to palpation right shoulder.  Good passive range of motion in the shoulder, pain with active range of motion.  Grip strength full and equal bilateral hands  Skin:    General: Skin is warm and dry.  Neurological:     General: No focal deficit present.     Mental  Status: He is oriented to person, place, and time.     Motor: No weakness.     Gait: Gait normal.  Comments: Bilateral upper extremities neurovascularly intact  Psychiatric:        Mood and Affect: Mood normal.        Thought Content: Thought content normal.        Judgment: Judgment normal.     UC Treatments / Results  Labs (all labs ordered are listed, but only abnormal results are displayed) Labs Reviewed - No data to display  EKG   Radiology No results found.  Procedures Procedures (including critical care time)  Medications Ordered in UC Medications - No data to display  Initial Impression / Assessment and Plan / UC Course  I have reviewed the triage vital signs and the nursing notes.  Pertinent labs & imaging results that were available during my care of the patient were reviewed by me and considered in my medical decision making (see chart for details).     Consistent with a muscular strain of the right shoulder.  We will treat with prednisone burst, Flexeril.  Discussed extreme caution with use of NSAIDs given his concurrent use of Eliquis.  He will stop taking this and switch to only Tylenol for over-the-counter medications.  Discussed supportive home care, sports med follow-up if not fully resolving.  Final Clinical Impressions(s) / UC Diagnoses   Final diagnoses:  Strain of right shoulder, initial encounter   Discharge Instructions   None    ED Prescriptions     Medication Sig Dispense Auth. Provider   predniSONE (DELTASONE) 20 MG tablet Take 2 tablets (40 mg total) by mouth daily with breakfast. 10 tablet Volney American, PA-C   cyclobenzaprine (FLEXERIL) 5 MG tablet Take 1 tablet (5 mg total) by mouth 3 (three) times daily as needed for muscle spasms. Do not drink alcohol or drive while taking this medication.  May cause drowsiness. 15 tablet Volney American, [place]      PDMP not reviewed this encounter.   Volney American,  [place] 01/17/21 1658

## 2021-01-21 ENCOUNTER — Inpatient Hospital Stay (HOSPITAL_BASED_OUTPATIENT_CLINIC_OR_DEPARTMENT_OTHER): Payer: Medicaid Other | Admitting: Hematology

## 2021-01-21 ENCOUNTER — Inpatient Hospital Stay: Payer: Medicaid Other | Attending: Physician Assistant

## 2021-01-21 ENCOUNTER — Other Ambulatory Visit: Payer: Self-pay

## 2021-01-21 ENCOUNTER — Inpatient Hospital Stay: Payer: Medicaid Other

## 2021-01-21 VITALS — BP 95/70 | HR 74 | Temp 98.7°F | Resp 17 | Ht 71.0 in | Wt 133.0 lb

## 2021-01-21 DIAGNOSIS — D649 Anemia, unspecified: Secondary | ICD-10-CM

## 2021-01-21 DIAGNOSIS — Z79899 Other long term (current) drug therapy: Secondary | ICD-10-CM | POA: Diagnosis not present

## 2021-01-21 DIAGNOSIS — Z923 Personal history of irradiation: Secondary | ICD-10-CM | POA: Diagnosis not present

## 2021-01-21 DIAGNOSIS — N529 Male erectile dysfunction, unspecified: Secondary | ICD-10-CM | POA: Diagnosis not present

## 2021-01-21 DIAGNOSIS — Z298 Encounter for other specified prophylactic measures: Secondary | ICD-10-CM | POA: Diagnosis not present

## 2021-01-21 DIAGNOSIS — C3491 Malignant neoplasm of unspecified part of right bronchus or lung: Secondary | ICD-10-CM

## 2021-01-21 DIAGNOSIS — Z7189 Other specified counseling: Secondary | ICD-10-CM

## 2021-01-21 DIAGNOSIS — Z87891 Personal history of nicotine dependence: Secondary | ICD-10-CM | POA: Diagnosis not present

## 2021-01-21 DIAGNOSIS — Z95828 Presence of other vascular implants and grafts: Secondary | ICD-10-CM

## 2021-01-21 DIAGNOSIS — Z5112 Encounter for antineoplastic immunotherapy: Secondary | ICD-10-CM | POA: Insufficient documentation

## 2021-01-21 DIAGNOSIS — Z7901 Long term (current) use of anticoagulants: Secondary | ICD-10-CM | POA: Insufficient documentation

## 2021-01-21 DIAGNOSIS — Z86718 Personal history of other venous thrombosis and embolism: Secondary | ICD-10-CM | POA: Diagnosis not present

## 2021-01-21 DIAGNOSIS — Z5111 Encounter for antineoplastic chemotherapy: Secondary | ICD-10-CM

## 2021-01-21 DIAGNOSIS — D508 Other iron deficiency anemias: Secondary | ICD-10-CM | POA: Insufficient documentation

## 2021-01-21 DIAGNOSIS — C3490 Malignant neoplasm of unspecified part of unspecified bronchus or lung: Secondary | ICD-10-CM | POA: Insufficient documentation

## 2021-01-21 LAB — CMP (CANCER CENTER ONLY)
ALT: 8 U/L (ref 0–44)
AST: 12 U/L — ABNORMAL LOW (ref 15–41)
Albumin: 3.8 g/dL (ref 3.5–5.0)
Alkaline Phosphatase: 75 U/L (ref 38–126)
Anion gap: 9 (ref 5–15)
BUN: 8 mg/dL (ref 6–20)
CO2: 27 mmol/L (ref 22–32)
Calcium: 9.4 mg/dL (ref 8.9–10.3)
Chloride: 102 mmol/L (ref 98–111)
Creatinine: 0.88 mg/dL (ref 0.61–1.24)
GFR, Estimated: >60 mL/min (ref >60)
Glucose, Bld: 106 mg/dL — ABNORMAL HIGH (ref 70–99)
Potassium: 3.9 mmol/L (ref 3.5–5.1)
Sodium: 138 mmol/L (ref 135–145)
Total Bilirubin: <0.2 mg/dL — ABNORMAL LOW (ref 0.3–1.2)
Total Protein: 6.6 g/dL (ref 6.5–8.1)

## 2021-01-21 LAB — CBC WITH DIFFERENTIAL/PLATELET
Abs Immature Granulocytes: 0.03 10*3/uL (ref 0.00–0.07)
Basophils Absolute: 0 10*3/uL (ref 0.0–0.1)
Basophils Relative: 0 %
Eosinophils Absolute: 0 10*3/uL (ref 0.0–0.5)
Eosinophils Relative: 0 %
HCT: 32.1 % — ABNORMAL LOW (ref 39.0–52.0)
Hemoglobin: 10.1 g/dL — ABNORMAL LOW (ref 13.0–17.0)
Immature Granulocytes: 0 %
Lymphocytes Relative: 22 %
Lymphs Abs: 1.8 10*3/uL (ref 0.7–4.0)
MCH: 23.9 pg — ABNORMAL LOW (ref 26.0–34.0)
MCHC: 31.5 g/dL (ref 30.0–36.0)
MCV: 76.1 fL — ABNORMAL LOW (ref 80.0–100.0)
Monocytes Absolute: 0.9 10*3/uL (ref 0.1–1.0)
Monocytes Relative: 11 %
Neutro Abs: 5.3 10*3/uL (ref 1.7–7.7)
Neutrophils Relative %: 67 %
Platelets: 354 10*3/uL (ref 150–400)
RBC: 4.22 MIL/uL (ref 4.22–5.81)
RDW: 15.1 % (ref 11.5–15.5)
WBC: 8.1 10*3/uL (ref 4.0–10.5)
nRBC: 0 % (ref 0.0–0.2)

## 2021-01-21 MED ORDER — SODIUM CHLORIDE 0.9% FLUSH
10.0000 mL | INTRAVENOUS | Status: DC | PRN
  Administered 2021-01-21: 10 mL

## 2021-01-21 MED ORDER — ACETAMINOPHEN 325 MG PO TABS
650.0000 mg | ORAL_TABLET | Freq: Once | ORAL | Status: AC
  Administered 2021-01-21: 650 mg via ORAL
  Filled 2021-01-21: qty 2

## 2021-01-21 MED ORDER — HEPARIN SOD (PORK) LOCK FLUSH 100 UNIT/ML IV SOLN
500.0000 [IU] | Freq: Once | INTRAVENOUS | Status: AC | PRN
  Administered 2021-01-21: 500 [IU]

## 2021-01-21 MED ORDER — FAMOTIDINE 20 MG PO TABS
20.0000 mg | ORAL_TABLET | Freq: Once | ORAL | Status: AC
  Administered 2021-01-21: 20 mg via ORAL
  Filled 2021-01-21: qty 1

## 2021-01-21 MED ORDER — DIPHENHYDRAMINE HCL 25 MG PO CAPS
25.0000 mg | ORAL_CAPSULE | Freq: Once | ORAL | Status: AC
  Administered 2021-01-21: 25 mg via ORAL
  Filled 2021-01-21: qty 1

## 2021-01-21 MED ORDER — SODIUM CHLORIDE 0.9 % IV SOLN: Freq: Once | INTRAVENOUS | Status: AC

## 2021-01-21 MED ORDER — SILDENAFIL CITRATE 25 MG PO TABS: 25.0000 mg | ORAL_TABLET | Freq: Every day | ORAL | 0 refills | Status: DC | PRN

## 2021-01-21 MED ORDER — ENSURE ENLIVE PO LIQD: 1.0000 | Freq: Two times a day (BID) | ORAL | 1 refills | Status: DC

## 2021-01-21 MED ORDER — SODIUM CHLORIDE 0.9 % IV SOLN
1200.0000 mg | Freq: Once | INTRAVENOUS | Status: AC
  Administered 2021-01-21: 1200 mg via INTRAVENOUS
  Filled 2021-01-21: qty 20

## 2021-01-21 NOTE — Patient Instructions (Signed)
[facility] ONCOLOG[facility]  Discharge Instructions: Thank you for choosing [facility] to provide your oncology and hematology care.   If you have a lab appointment with the [facility], please go directly to the [facility] and check in at the registration area.   Wear comfortable clothing and clothing appropriate for easy access to any Portacath or PICC line.   We strive to give you quality time with your provider. You may need to reschedule your appointment if you arrive late (15 or more minutes).  Arriving late affects you and other patients whose appointments are after yours.  Also, if you miss three or more appointments without notifying the office, you may be dismissed from the clinic at the provider's discretion.      For prescription refill requests, have your pharmacy contact our office and allow 72 hours for refills to be completed.    Today you received the following chemotherapy and/or immunotherapy agents: Tecentriq    To help prevent nausea and vomiting after your treatment, we encourage you to take your nausea medication as directed.  BELOW ARE SYMPTOMS THAT SHOULD BE REPORTED IMMEDIATELY: *FEVER GREATER THAN 100.4 F (38 C) OR HIGHER *CHILLS OR SWEATING *NAUSEA AND VOMITING THAT IS NOT CONTROLLED WITH YOUR NAUSEA MEDICATION *UNUSUAL SHORTNESS OF BREATH *UNUSUAL BRUISING OR BLEEDING *URINARY PROBLEMS (pain or burning when urinating, or frequent urination) *BOWEL PROBLEMS (unusual diarrhea, constipation, pain near the anus) TENDERNESS IN MOUTH AND THROAT WITH OR WITHOUT PRESENCE OF ULCERS (sore throat, sores in mouth, or a toothache) UNUSUAL RASH, SWELLING OR PAIN  UNUSUAL VAGINAL DISCHARGE OR ITCHING   Items with * indicate a potential emergency and should be followed up as soon as possible or go to the Emergency Department if any problems should occur.  Please show the CHEMOTHERAPY ALERT CARD or IMMUNOTHERAPY ALERT CARD at check-in to  the Emergency Department and triage nurse.  Should you have questions after your visit or need to cancel or reschedule your appointment, please contact [facility]  Dept: [phone]  and follow the prompts.  Office hours are 8:00 a.m. to 4:30 p.m. Monday - Friday. Please note that voicemails left after 4:00 p.m. may not be returned until the following business day.  We are closed weekends and major holidays. You have access to a nurse at all times for urgent questions. Please call the main number to the clinic Dept: [phone] and follow the prompts.   For any non-urgent questions, you may also contact your provider using MyChart. We now offer e-Visits for anyone 54 and older to request care online for non-urgent symptoms. For details visit [email or web address].   Also download the MyChart app! Go to the app store, search "MyChart", open the app, select [facility], and log in with your MyChart username and password.  Due to Covid, a mask is required upon entering the hospital/clinic. If you do not have a mask, one will be given to you upon arrival. For doctor visits, patients may have 1 support person aged 54 or older with them. For treatment visits, patients cannot have anyone with them due to current Covid guidelines and our immunocompromised population.

## 2021-01-27 ENCOUNTER — Encounter: Payer: Self-pay | Admitting: Hematology

## 2021-01-27 NOTE — Progress Notes (Signed)
HEMATOLOGY/ONCOLOGY CLINIC NOTE  Date of Service: .01/21/2021   Patient Care Team: [name], MD as PCP - General (Family Medicine)   CHIEF COMPLAINTS/PURPOSE OF CONSULTATION:  contnued management of Metastatic Poorly differentiated lung adenocarcinoma  HISTORY OF PRESENTING ILLNESS:   David Berry is a 55 year old male with no significant medical history.  He presented to the hospital with progressive right arm and chest pain with new right-sided neck swelling.  The patient states that he was diagnosed with carpal tunnel syndrome received an injection in August.  However, he continued to have progressive pain up and down his right arm that would also radiate to his right chest.  He took ibuprofen with no improvement.  1 week prior to admission, he developed new right sided neck swelling.  He presented to the emergency room for further evaluation.  Work-up in the emergency room was significant for a CT angiogram of the neck and chest which revealed a bulky soft tissue mass in the right neck continuing into the upper chest most compatible with extensive metastatic lymphadenopathy.  This mass measures up to 34 mm and encases multiple vessels including the right upper lobe pulmonary vessels, superior vena cava, and brachiocephalic vessel.  He also had a large poorly defined malignant appearing infiltrative mass measuring 6.2 x 5.6 x 7.9 cm within the right anterior and middle mediastinum with suspected invasion of the distal left brachiocephalic vein and probable tumor occlusion of the right subclavian and jugular veins.  There is also suspected tumor invasion of the upper SVC with string-like narrowing of the SVC but with patency of the SVC just above the right atrium.  The tumor abuts the aorta and great vessels and also results in significant narrowing of the right upper lobe pulmonary arterial vessels.  He also had a CT of the abdomen pelvis which showed a 15 mm heterogeneously enhancing  lesion in the upper pole of the right kidney concerning for renal cell carcinoma, no lymphadenopathy in the abdomen or pelvis, focal hyperenhancement of the anterior left liver most likely related to aberrant venous anatomy/flow, sclerotic lesions in the right acetabulum and left iliac bone are indeterminate.  These are likely bone islands but attention on follow-up is recommended a metastatic disease cannot be excluded.  MRI of the brain with and without contrast did not show any evidence of intracranial metastatic disease. The patient underwent a CT-guided biopsy in interventional radiology on 01/22/2019.  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   When seen today, patient reports ongoing pain and swelling to his right arm and right side of his neck.  Reports ongoing pain to his right chest.  He reports intermittent facial swelling which is worse in the morning and improves throughout the day.  He reports anorexia and a weight loss of 20 pounds over the past few weeks. He has had dizziness for the past few months.  Denies headaches.  He also noted that on the day of admission his peripheral vision was poor.  His vision is now improved.  He denies food getting stuck or difficulty swallowing but does feel as though his throat is "tight."  He reports a nonproductive cough.  Denies fevers and chills.  He is not really having any shortness of breath.  Denies abdominal pain, nausea, vomiting, constipation, diarrhea.  He has not noticed any epistaxis, hemoptysis, hematemesis, hematuria, melena, hematochezia.  The patient is single.  He has  1 daughter who lives in [place], [place].  Reports occasional alcohol use and currently smokes 3 cigars a day since 1997.  He is currently living in a rooming house with other roommates who all have their own room.  They share a common bathroom and kitchen.  Medical oncology was asked see  the patient to make recommendations regarding his newly diagnosed poorly differentiated carcinoma.  History reviewed. No pertinent past medical history.  Current Treatment:  Atezolizumab maintenance   INTERVAL HISTORY:   David Bonny. is a 55 y.o. male who is here for evaluation and management of metastatic poorly differentiated lung adenocarcinoma. He is here for his next cycle of maintenance Atezolizumab. The patient's last visit with [place] was on 12/10/2020. The pt reports that he is doing well overall.  The pt reports no acute new focal symptoms.  No acute shortness of breath or chest pain.  Weight has been stable. Notes some mild dizziness or lightheadedness when he stands up suddenly from a sitting position.  Blood pressures are low but stable. We have discussed in the past increase fluid intake and increase salt intake and dietary interventions to help him gain some weight.  Lab results today 01/21/2021 CBC stable hemoglobin 10.1 MCV at 76 WBC and platelet counts within normal limits. CMP unremarkable  Patient notes issues with erectile dysfunction.  He notes he has a girlfriend and erectile dysfunction is quite upsetting to him.  He would really like some help in this department.  He notes that he has desired but cannot have a significant erection or maintain it.  We discussed the pros and cons of considering Viagra use and he would like to try this.  A prescription was sent.  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[place])    pt denies this   GERD (gastroesophageal reflux disease)    Lung cancer ([place]) dx'd 01/2019   Peripheral vascular disease ([place])    blood clot in neck Sept 12, 2020    SURGICAL HISTORY: Past Surgical History:  Procedure Laterality Date   APPENDECTOMY     teenager   INGUINAL HERNIA REPAIR Right 2016, 2017   [place], 2018  [facility]-removed mesh   IR IMAGING GUIDED PORT INSERTION  04/26/2019   TOOTH EXTRACTION N/A 03/14/2020   Procedure: DENTAL RESTORATION/EXTRACTIONS;  Surgeon: Diona Browner, DDS;  Location: [facility];  Service: Oral Surgery;  Laterality: N/A;    SOCIAL HISTORY: Social History   Socioeconomic History   Marital status: Single    Spouse name: Not on file   Number of children: 1   Years of education: GED   Highest education level: Some college, no degree  Occupational History    Comment: fork lift driver  Tobacco Use   Smoking status: Former    Types: Cigars    Quit date: 01/20/2019    Years since quitting: 2.0   Smokeless tobacco: Never   Tobacco comments:    smokes 3 black and mild cigars per day  Substance and Sexual Activity   Alcohol use: Not Currently   Drug use: No   Sexual activity: Yes  Other Topics Concern   Not on file  Social History Narrative   Lives alone   Caffeine- coffee, 20 oz daily, tea occas   Social Determinants of Health   Financial  Resource Strain: Low Risk    Difficulty of Paying Living Expenses: Not very hard  Food Insecurity: No Food Insecurity   Worried About Charity fundraiser in the Last Year: Never true   Ran Out of Food in the Last Year: Never true  Transportation Needs: No Transportation Needs   Lack of Transportation (Medical): No   Lack of Transportation (Non-Medical): No  Physical Activity: Sufficiently Active   Days of Exercise per Week: 3 days   Minutes of Exercise per Session: 150+ min  Stress: No Stress Concern Present   Feeling of Stress : Not at all  Social Connections: Socially Isolated   Frequency of Communication with Friends and Family: More than three times a week   Frequency of Social Gatherings with Friends and Family: Once a week   Attends Religious Services: Never   Marine scientist or Organizations: No   Attends Music therapist: Not on file   Marital Status: Never married  Human resources officer  Violence: Not on file    FAMILY HISTORY: Family History  Problem Relation Age of Onset   Prostate cancer Father     ALLERGIES:  is allergic to pregabalin.  MEDICATIONS:  Current Outpatient Medications  Medication Sig Dispense Refill   feeding supplement (ENSURE ENLIVE / ENSURE PLUS) LIQD Take 237 mLs by mouth 2 (two) times daily between meals. 14220 mL 1   sildenafil (VIAGRA) 25 MG tablet Take 1 tablet (25 mg total) by mouth daily as needed for erectile dysfunction. Take with or without food 30 minutes to 4 hours before sexual activity 10 tablet 0   apixaban (ELIQUIS) 5 MG TABS tablet Take 1 tablet (5 mg total) by mouth 2 (two) times daily. 60 tablet 11   cyclobenzaprine (FLEXERIL) 5 MG tablet Take 1 tablet (5 mg total) by mouth 3 (three) times daily as needed for muscle spasms. Do not drink alcohol or drive while taking this medication.  May cause drowsiness. 15 tablet 0   esomeprazole (NEXIUM) 40 MG capsule TAKE 1 CAPSULE(40 MG) BY MOUTH DAILY 30 capsule 1   iron polysaccharides (NIFEREX) 150 MG capsule Take 1 capsule (150 mg total) by mouth daily. 30 capsule 3   lidocaine-prilocaine (EMLA) cream Apply 1 application topically as needed. 30 g 0   predniSONE (DELTASONE) 20 MG tablet Take 2 tablets (40 mg total) by mouth daily with breakfast. 10 tablet 0   No current facility-administered medications for this visit.   REVIEW OF SYSTEMS:   .10 Point review of Systems was done is negative except as noted above.   PHYSICAL EXAMINATION: ECOG FS:1 - Symptomatic but completely ambulatory  Vitals:   01/21/21 1001  BP: 95/70  Pulse: 74  Resp: 17  Temp: 98.7 F (37.1 C)  SpO2: 99%    Wt Readings from Last 3 Encounters:  01/21/21 133 lb (60.3 kg)  12/31/20 132 lb 4 oz (60 kg)  12/10/20 133 lb 4.8 oz (60.5 kg)   Body mass index is 18.55 kg/m.    [name] GENERAL:alert, in no acute distress and comfortable SKIN: no acute rashes, no significant lesions EYES: conjunctiva are pink and  non-injected, sclera anicteric OROPHARYNX: MMM, no exudates, no oropharyngeal erythema or ulceration NECK: supple, no JVD LYMPH:  no palpable lymphadenopathy in the cervical, axillary or inguinal regions LUNGS: clear to auscultation b/l with normal respiratory effort HEART: regular rate & rhythm ABDOMEN:  normoactive bowel sounds , non tender, not distended. Extremity: no pedal edema PSYCH: alert & oriented  x 3 with fluent speech NEURO: no focal motor/sensory deficits    LABORATORY DATA:  I have reviewed the data as listed  . CBC Latest Ref Rng & Units 01/21/2021 12/31/2020 12/10/2020  WBC 4.0 - 10.5 K/uL 8.1 4.7 4.6  Hemoglobin 13.0 - 17.0 g/dL 10.1(L) 9.8(L) 9.9(L)  Hematocrit 39.0 - 52.0 % 32.1(L) 32.8(L) 32.0(L)  Platelets 150 - 400 K/uL 354 266 337    . CMP Latest Ref Rng & Units 01/21/2021 12/31/2020 12/10/2020  Glucose 70 - 99 mg/dL 106(H) 103(H) 97  BUN 6 - 20 mg/dL _0 Creatinine 0.61 - 1.24 mg/dL 0.88 1.10 0.99  Sodium 135 - 145 mmol/L 138 139 139  Potassium 3.5 - 5.1 mmol/L 3.9 4.4 4.2  Chloride 98 - 111 mmol/L 102 106 105  CO2 22 - 32 mmol/L _1 Calcium 8.9 - 10.3 mg/dL 9.4 9.6 9.4  Total Protein 6.5 - 8.1 g/dL 6.6 7.2 6.8  Total Bilirubin 0.3 - 1.2 mg/dL <0.2(L) 0.3 <0.2(L)  Alkaline Phos 38 - 126 U/L 75 97 93  AST 15 - 41 U/L 12(L) 14(L) 14(L)  ALT 0 - 44 U/L _2 01/22/2019 [facility]: Tumor Mutational Burden     01/22/2019 PD-L1 Immunohistochemistry Analysis    01/22/2019 Soft Tissue Needle Core Biopsy Surgical Pathology     RADIOGRAPHIC STUDIES: I have personally reviewed the radiological images as listed and agreed with the findings in the report. No results found.  ASSESSMENT & PLAN:   This is a 55 year old male with   1. Metastatic Poorly differentiated lung adenocarcinoma Presented with Large neck mass, mediastinal mass, renal mass, and questionable bone lesions  no brain mets on MRI brain 01/22/2019 PD-L1  Immunohistochemistry Analysis which revealed "Tumor Proportion Score (TPS) 50%" 05/16/2019 PET/CT ([id number]) revealed "Radiation changes in the right hemithorax, as above. Improving mediastinal nodal metastases. Prior bulky right cervical metastases have resolved. No evidence of metastatic disease in the abdomen/pelvis." 07/19/2019 Esophagus Scan ([id number]) revealed "Mild stricture at the C6-7 level likely related to prior esophageal surgery. Barium tablet was slow to pass through this area but did pass through after 1 minutes. Hiatal hernia with moderate gastroesophageal reflux. Moderate stricture above the hiatal hernia likely due to reflux. Barium tablet did not pass this area. There are changes of esophagitis which are likely due to reflux and possibly radiation as well."  10/10/19 of  CT Chest W Contrast ([id number])- no evidence of lung cancer progression at this time.   2. 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 Symptomatic hemorrhoids-currently no significant bleeding but have caused some mild iron deficiency anemia.  5. Normocytic anemia -Likely due to underlying malignancy   6. S/pThrombocytosis -- likely due to paraneoplastic effect of tumor and reactive due to inflammation and tissue inflammation from RT.-- now resolved.   7. Nicotine dependence -He was counseled about tobacco cessation.  -has quit since cancer diagnosis.  8. Iron deficiency - likely due to blood loss from hemorrhoids PLAN:  -Discussed pt labwork today, 01/21/2021; reviewed with patient. -Advised pt that he is in no evidence of disease status currently. -Recommended pt drink 48-64 oz of water daily and given his low blood pressure to increase his p.o. salt intake. -Given a prescription for Ensure to try to increase his caloric intake -The pt has no  prohibitive  toxicities from continuing Atezolizumab at this time.  -PO iron polysaccharide 18m po daily.  If tolerated patient can take 150 mg p.o. twice daily-- will consider IV Iron if needed  8 erectile dysfunction likely due to previous history of smoking and cancer related decrease in testosterone levels. Patient has no cardiac limitations that are known at this time. Plan -Given referral to urology for evaluation of erectile dysfunction. -After discussing pros and cons patient was given a low-dose prescription for Viagra to try prior to sex. -We shall check his testosterone levels with his next labs.   FOLLOW UP: [facility] for evaluation and management of erectile dysfunction Plz schedule next 4 - Atezolizumab treatments with portflush and labs MD visit every other treatment.   . The total time spent in the appointment was 31 minutes and more than 50% was on counseling and direct patient cares.   All of the patient's questions were answered with apparent satisfaction. The patient knows to call the clinic with any problems, questions or concerns.   GSullivan LoneMD MViolaAAHIVMS SYuma Endoscopy CenterCBayfront Health Spring HillHematology/Oncology Physician CInd[facility]

## 2021-02-06 ENCOUNTER — Encounter: Payer: Self-pay | Admitting: Gastroenterology

## 2021-02-06 ENCOUNTER — Ambulatory Visit (INDEPENDENT_AMBULATORY_CARE_PROVIDER_SITE_OTHER): Payer: Medicaid Other | Admitting: Gastroenterology

## 2021-02-06 ENCOUNTER — Other Ambulatory Visit: Payer: Self-pay | Admitting: Hematology

## 2021-02-06 VITALS — BP 90/70 | HR 80 | Ht 69.69 in | Wt 132.2 lb

## 2021-02-06 DIAGNOSIS — K222 Esophageal obstruction: Secondary | ICD-10-CM | POA: Diagnosis not present

## 2021-02-06 DIAGNOSIS — C3491 Malignant neoplasm of unspecified part of right bronchus or lung: Secondary | ICD-10-CM | POA: Diagnosis not present

## 2021-02-06 DIAGNOSIS — K219 Gastro-esophageal reflux disease without esophagitis: Secondary | ICD-10-CM

## 2021-02-06 MED ORDER — ESOMEPRAZOLE MAGNESIUM 40 MG PO CPDR: 40.0000 mg | DELAYED_RELEASE_CAPSULE | Freq: Every day | ORAL | 2 refills | Status: DC

## 2021-02-06 MED ORDER — SILDENAFIL CITRATE 25 MG PO TABS: 25.0000 mg | ORAL_TABLET | Freq: Every day | ORAL | 1 refills | Status: DC | PRN

## 2021-02-06 NOTE — Patient Instructions (Signed)
If you are age 54 or older, your body mass index should be between 23-30. Your Body mass index is 19.15 kg/m. If this is out of the aforementioned range listed, please consider follow up with your Primary Care Provider.  If you are age 78 or younger, your body mass index should be between 19-25. Your Body mass index is 19.15 kg/m. If this is out of the aformentioned range listed, please consider follow up with your Primary Care Provider.   __________________________________________________________  The [facility] providers would like to encourage you to use [facility] to communicate with providers for non-urgent requests or questions.  Due to long hold times on the telephone, sending your provider a message by [facility] may be a faster and more efficient way to get a response.  Please allow 48 business hours for a response.  Please remember that this is for non-urgent requests.   Follow up in 1 year   It was a pleasure to see you today!  Thank you for trusting me with your gastrointestinal care!

## 2021-02-06 NOTE — Progress Notes (Signed)
Referring Provider: [name], MD Primary Care Physician:  [name], MD   Chief complaint:  Reflux   IMPRESSION:  Longstanding GERD with LA Class D reflux esophagitis on EGD 2021. Symptoms well controlled on daily PPI. He declined an increase in dose to BID as he does not feel it is necessary.    Esophageal stricturing/stenosis - likely multifactorial due to prior surgery, radiation, and reflux.  He may ultimately benefit from gastrostomy tube placement to maximize nutrition support. He is high risk for dilation.    Need to maximize long term nutrition - briefly introduced the idea of a gastrostomy tube. This was new to him. I shared images of tube and discussed placement and basic care. He would like to discuss with Dr. Irene Limbo but was interested in increasing his caloric intake. IR consultation would be appropriate if he decides to proceed.    PLAN: Continue esomeprazole 40 mg QD (3 months with 3 refills) Discussed long-term risks of PPI therapy Follow-up at least annually, earlier if needed  Today's encounter was over 30 minutes which included precharting, chart/result review, history/exam, formulating a treatment plan and documentation.    HPI: David Berry. is a 55 y.o. male returns in follow-up for longstanding history of reflux. He was last seen by Tye Savoy 07/24/19.  The interval history is obtained through the patient and review of his electronic health record.  He had history of esophageal surgery as a child, reporting being in and out of the hospital many times between the ages of 41 and 6 years old for esophageal problems.  However he did not have major swallowing problems for many years.  He learned to compensate by chewing well and eating slowly.  He has metastatic poorly differentiated adenocarcinoma of the lung presenting with a large neck mass, mediastinal mass, renal mass, and possible bone lesions.  He received palliative to radiation for  impending SVC syndrome.  History is also been complicated by an acute DVT requiring long-term Eliquis and a history of thrombocytosis thought to be related to paraneoplastic effect of tumor and the response to radiation.  He is currently receiving treatment with atezolizumab.   EGD 07/09/2019 to evaluate reflux showed LA class D reflux esophagitis with a benign-appearing esophageal stenosis with incomplete assessment due to inability to transverse the stricture.  He was not reporting any dysphagia at that time.  Barium swallow 07/19/2019 showed a mild stricture at C6-7, successful but slow passage of a barium tablet through this area, hiatal hernia with moderate reflux, and a moderate stricture above the hiatal hernia  At the time of his office visit 07/24/2019 his symptoms have improved on omeprazole 40 mg twice daily.  CT of the chest, abdomen, and pelvis 11/08/2020 showed generalized mild smooth wall thickening throughout the thoracic esophagus unchanged.  Right paratracheal soft tissue enlargement.  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  No new or progressive metastatic disease.  Denies dysphagia or odynophagia but feels his throat is "tight."  His cancer doctors have recommended Ensure. He has never heard about  Endoscopic history: - Screening colonoscopy 07/09/2019 showed internal hemorrhoids and was otherwise normal - EGD for reflux 07/09/2019 showed LA class D reflux esophagitis, benign-appearing stenosis located 40 cm from the incisors that could not be traversed due to resistance.  A small rent developed with attempted endoscopic evaluation.   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[place])    pt denies this   GERD (gastroesophageal reflux disease)    Lung cancer ([place]) dx'd 01/2019   Peripheral vascular disease ([place])    blood clot in neck Sept 12, 2020     Past Surgical History:  Procedure Laterality Date   APPENDECTOMY     teenager   INGUINAL HERNIA REPAIR Right 2016, 2017   [place], 2018 [facility]-removed mesh   IR IMAGING GUIDED PORT INSERTION  04/26/2019   TOOTH EXTRACTION N/A 03/14/2020   Procedure: DENTAL RESTORATION/EXTRACTIONS;  Surgeon: Diona Browner, DDS;  Location: [facility];  Service: Oral Surgery;  Laterality: N/A;    Prior to Admission medications   Medication Sig Start Date End Date Taking? Authorizing Provider  apixaban (ELIQUIS) 5 MG TABS tablet Take 1 tablet (5 mg total) by mouth 2 (two) times daily. 06/25/20   Brunetta Genera, MD  cyclobenzaprine (FLEXERIL) 5 MG tablet Take 1 tablet (5 mg total) by mouth 3 (three) times daily as needed for muscle spasms. Do not drink alcohol or drive while taking this medication.  May cause drowsiness. 01/14/21   Volney American, PA-C  esomeprazole (NEXIUM) 40 MG capsule TAKE 1 CAPSULE(40 MG) BY MOUTH DAILY 05/19/20   Brunetta Genera, MD  feeding supplement (ENSURE ENLIVE / ENSURE PLUS) LIQD Take 237 mLs by mouth 2 (two) times daily between meals. 01/21/21 03/22/21  Brunetta Genera, MD  iron polysaccharides (NIFEREX) 150 MG capsule Take 1 capsule (150 mg total) by mouth daily. 12/18/20   Brunetta Genera, MD  lidocaine-prilocaine (EMLA) cream Apply 1 application topically as needed. 10/29/20   Lincoln Brigham, PA-C  predniSONE (DELTASONE) 20 MG tablet Take 2 tablets (40 mg total) by mouth daily with breakfast. 01/14/21   Volney American, PA-C  sildenafil (VIAGRA) 25 MG tablet Take 1 tablet (25 mg total) by mouth daily as needed for erectile dysfunction. Take with or without food 30 minutes to 4 hours before sexual activity 01/21/21   Brunetta Genera, MD    Current Outpatient Medications  Medication Sig Dispense Refill   apixaban (ELIQUIS) 5 MG TABS tablet Take 1 tablet (5 mg total) by mouth 2 (two) times daily. 60 tablet 11   esomeprazole (NEXIUM) 40 MG  capsule TAKE 1 CAPSULE(40 MG) BY MOUTH DAILY 30 capsule 1   iron polysaccharides (NIFEREX) 150 MG capsule Take 1 capsule (150 mg total) by mouth daily. 30 capsule 3   lidocaine-prilocaine (EMLA) cream Apply 1 application topically as needed. 30 g 0   No current facility-administered medications for this visit.    Allergies as of 02/06/2021 - Review Complete 02/06/2021  Allergen Reaction Noted   Pregabalin Other (See Comments) 09/02/2016    Family History  Problem Relation Age of Onset   Prostate cancer Father      Physical Exam: General:   Alert,  well-nourished, pleasant and cooperative in NAD Head:  Normocephalic and atraumatic. Eyes:  Sclera clear, no icterus.   Conjunctiva pink. Abdomen:  Soft, thin, muscular, nontender, nondistended, normal bowel sounds, no rebound or guarding. No hepatosplenomegaly.   Extremities:  No clubbing or edema. Neurologic:  Alert and  oriented x4;  grossly nonfocal Skin:  Intact without significant lesions or rashes. Psych:  Alert and cooperative. Normal mood and affect.     Salle Brandle L. Tarri Glenn, MD, MPH 02/06/2021, 9:21 AM

## 2021-02-11 ENCOUNTER — Other Ambulatory Visit: Payer: Self-pay

## 2021-02-11 ENCOUNTER — Inpatient Hospital Stay: Payer: Medicaid Other

## 2021-02-11 ENCOUNTER — Inpatient Hospital Stay: Payer: Medicaid Other | Admitting: Dietician

## 2021-02-11 ENCOUNTER — Inpatient Hospital Stay: Payer: Medicaid Other | Attending: Physician Assistant

## 2021-02-11 VITALS — BP 112/70 | HR 78 | Temp 98.2°F | Resp 18

## 2021-02-11 DIAGNOSIS — Z5112 Encounter for antineoplastic immunotherapy: Secondary | ICD-10-CM | POA: Insufficient documentation

## 2021-02-11 DIAGNOSIS — C3491 Malignant neoplasm of unspecified part of right bronchus or lung: Secondary | ICD-10-CM

## 2021-02-11 DIAGNOSIS — Z7189 Other specified counseling: Secondary | ICD-10-CM

## 2021-02-11 DIAGNOSIS — C3490 Malignant neoplasm of unspecified part of unspecified bronchus or lung: Secondary | ICD-10-CM | POA: Diagnosis present

## 2021-02-11 DIAGNOSIS — Z95828 Presence of other vascular implants and grafts: Secondary | ICD-10-CM

## 2021-02-11 LAB — CBC WITH DIFFERENTIAL/PLATELET
Abs Immature Granulocytes: 0.01 10*3/uL (ref 0.00–0.07)
Basophils Absolute: 0 10*3/uL (ref 0.0–0.1)
Basophils Relative: 1 %
Eosinophils Absolute: 0 10*3/uL (ref 0.0–0.5)
Eosinophils Relative: 1 %
HCT: 32.3 % — ABNORMAL LOW (ref 39.0–52.0)
Hemoglobin: 9.6 g/dL — ABNORMAL LOW (ref 13.0–17.0)
Immature Granulocytes: 0 %
Lymphocytes Relative: 22 %
Lymphs Abs: 1 10*3/uL (ref 0.7–4.0)
MCH: 23.1 pg — ABNORMAL LOW (ref 26.0–34.0)
MCHC: 29.7 g/dL — ABNORMAL LOW (ref 30.0–36.0)
MCV: 77.6 fL — ABNORMAL LOW (ref 80.0–100.0)
Monocytes Absolute: 0.5 10*3/uL (ref 0.1–1.0)
Monocytes Relative: 12 %
Neutro Abs: 3.1 10*3/uL (ref 1.7–7.7)
Neutrophils Relative %: 64 %
Platelets: 289 10*3/uL (ref 150–400)
RBC: 4.16 MIL/uL — ABNORMAL LOW (ref 4.22–5.81)
RDW: 16.2 % — ABNORMAL HIGH (ref 11.5–15.5)
WBC: 4.7 10*3/uL (ref 4.0–10.5)
nRBC: 0 % (ref 0.0–0.2)

## 2021-02-11 LAB — CMP (CANCER CENTER ONLY)
ALT: 8 U/L (ref 0–44)
AST: 14 U/L — ABNORMAL LOW (ref 15–41)
Albumin: 3.7 g/dL (ref 3.5–5.0)
Alkaline Phosphatase: 82 U/L (ref 38–126)
Anion gap: 8 (ref 5–15)
BUN: 9 mg/dL (ref 6–20)
CO2: 25 mmol/L (ref 22–32)
Calcium: 9.1 mg/dL (ref 8.9–10.3)
Chloride: 106 mmol/L (ref 98–111)
Creatinine: 1.03 mg/dL (ref 0.61–1.24)
GFR, Estimated: >60 mL/min (ref >60)
Glucose, Bld: 138 mg/dL — ABNORMAL HIGH (ref 70–99)
Potassium: 3.8 mmol/L (ref 3.5–5.1)
Sodium: 139 mmol/L (ref 135–145)
Total Bilirubin: 0.2 mg/dL — ABNORMAL LOW (ref 0.3–1.2)
Total Protein: 6.7 g/dL (ref 6.5–8.1)

## 2021-02-11 LAB — VITAMIN B12: Vitamin B-12: 289 pg/mL (ref 180–914)

## 2021-02-11 MED ORDER — FAMOTIDINE 20 MG PO TABS
20.0000 mg | ORAL_TABLET | Freq: Once | ORAL | Status: AC
  Administered 2021-02-11: 20 mg via ORAL
  Filled 2021-02-11: qty 1

## 2021-02-11 MED ORDER — SODIUM CHLORIDE 0.9 % IV SOLN
1200.0000 mg | Freq: Once | INTRAVENOUS | Status: AC
  Administered 2021-02-11: 1200 mg via INTRAVENOUS
  Filled 2021-02-11: qty 20

## 2021-02-11 MED ORDER — SODIUM CHLORIDE 0.9 % IV SOLN: Freq: Once | INTRAVENOUS | Status: DC

## 2021-02-11 MED ORDER — DIPHENHYDRAMINE HCL 25 MG PO CAPS
25.0000 mg | ORAL_CAPSULE | Freq: Once | ORAL | Status: AC
  Administered 2021-02-11: 25 mg via ORAL
  Filled 2021-02-11: qty 1

## 2021-02-11 MED ORDER — ACETAMINOPHEN 325 MG PO TABS
650.0000 mg | ORAL_TABLET | Freq: Once | ORAL | Status: AC
  Administered 2021-02-11: 650 mg via ORAL
  Filled 2021-02-11: qty 2

## 2021-02-11 MED ORDER — SODIUM CHLORIDE 0.9% FLUSH: 10.0000 mL | INTRAVENOUS | Status: DC | PRN

## 2021-02-11 NOTE — Progress Notes (Signed)
Nutrition Follow-up:  Patient receiving maintenance atezolizumab for metastatic differentiated lung adenocarcinoma.   Met with patient during infusion. He reports intermittent nausea, takes Ativan as needed. This is working well for him. Patient reports he has been eating more, but has been unable to gain weight. Patient eats 3 meals daily and sometimes will snack. He is asking if peanut butter is safe to eat, enjoys peanut butter crackers. Patient will usually have eggs with cheese or grits for breakfast, half sandwich (tuna fish) with chips for lunch. He has been cooking more at home for dinner. Last night he made BBQ pig tails, northern beans, collards. Patient is currently not drinking oral supplements due to cost. Patient denies constipation, diarrhea, altered taste.   Medications: Eliquis, Nexium, Niferex  Labs: Glucose 138  Anthropometrics: Last weight 132 lb 4 oz on 9/30 stable with 133 lb on 9/14 and 132 lb 4 oz on 8/24   NUTRITION DIAGNOSIS: Unintended weight loss stable  INTERVENTION:  Encouraged small frequent meals and snacks with adequate calories and protein - handout and recipes provided Discussed ways to add calories and protein to foods (using whole milk in oatmeal, creamy soups vs water, adding cheese, using supplement in coffee or mix with ice cream)  Educated on foods with protein, encouraged protein source with all meals and snacks - handout provided  Suggested adding bedtime snack for added calories Drink 2 Boost Plus/equivalent daily per MD Discussed tips for nausea, foods to avoid and foods best tolerated - handout provided Patient provided complimentary case of Ensure Enlive  Contact information provided    MONITORING, EVALUATION, GOAL:  Patient will tolerate increased calories and protein to promote weight gain   NEXT VISIT: Wednesday November 16 during infusion

## 2021-02-11 NOTE — Patient Instructions (Signed)
[facility]  Discharge Instructions: Thank you for choosing [facility] to provide your oncology and hematology care.   If you have a lab appointment with the [facility], please go directly to the [facility] and check in at the registration area.   Wear comfortable clothing and clothing appropriate for easy access to any Portacath or PICC line.   We strive to give you quality time with your provider. You may need to reschedule your appointment if you arrive late (15 or more minutes).  Arriving late affects you and other patients whose appointments are after yours.  Also, if you miss three or more appointments without notifying the office, you may be dismissed from the clinic at the provider's discretion.      For prescription refill requests, have your pharmacy contact our office and allow 72 hours for refills to be completed.    Today you received the following chemotherapy and/or immunotherapy agents: Tecentriq    To help prevent nausea and vomiting after your treatment, we encourage you to take your nausea medication as directed.  BELOW ARE SYMPTOMS THAT SHOULD BE REPORTED IMMEDIATELY: *FEVER GREATER THAN 100.4 F (38 C) OR HIGHER *CHILLS OR SWEATING *NAUSEA AND VOMITING THAT IS NOT CONTROLLED WITH YOUR NAUSEA MEDICATION *UNUSUAL SHORTNESS OF BREATH *UNUSUAL BRUISING OR BLEEDING *URINARY PROBLEMS (pain or burning when urinating, or frequent urination) *BOWEL PROBLEMS (unusual diarrhea, constipation, pain near the anus) TENDERNESS IN MOUTH AND THROAT WITH OR WITHOUT PRESENCE OF ULCERS (sore throat, sores in mouth, or a toothache) UNUSUAL RASH, SWELLING OR PAIN  UNUSUAL VAGINAL DISCHARGE OR ITCHING   Items with * indicate a potential emergency and should be followed up as soon as possible or go to the Emergency Department if any problems should occur.  Please show the CHEMOTHERAPY ALERT CARD or IMMUNOTHERAPY ALERT CARD at check-in to  the Emergency Department and triage nurse.  Should you have questions after your visit or need to cancel or reschedule your appointment, please contact [facility]  Dept: [phone]  and follow the prompts.  Office hours are 8:00 a.m. to 4:30 p.m. Monday - Friday. Please note that voicemails left after 4:00 p.m. may not be returned until the following business day.  We are closed weekends and major holidays. You have access to a nurse at all times for urgent questions. Please call the main number to the clinic Dept: [phone] and follow the prompts.   For any non-urgent questions, you may also contact your provider using MyChart. We now offer e-Visits for anyone 54 and older to request care online for non-urgent symptoms. For details visit [email or web address].   Also download the MyChart app! Go to the app store, search "MyChart", open the app, select [facility], and log in with your MyChart username and password.  Due to Covid, a mask is required upon entering the hospital/clinic. If you do not have a mask, one will be given to you upon arrival. For doctor visits, patients may have 1 support person aged 54 or older with them. For treatment visits, patients cannot have anyone with them due to current Covid guidelines and our immunocompromised population.

## 2021-02-12 LAB — IRON AND TIBC
Iron: 17 ug/dL — ABNORMAL LOW (ref 42–163)
Saturation Ratios: 5 % — ABNORMAL LOW (ref 20–55)
TIBC: 361 ug/dL (ref 202–409)
UIBC: 344 ug/dL (ref 117–376)

## 2021-02-12 LAB — FERRITIN: Ferritin: 11 ng/mL — ABNORMAL LOW (ref 24–336)

## 2021-02-27 ENCOUNTER — Ambulatory Visit: Payer: Self-pay

## 2021-02-27 ENCOUNTER — Encounter: Payer: Self-pay | Admitting: Family Medicine

## 2021-02-27 ENCOUNTER — Ambulatory Visit: Payer: Medicaid Other | Admitting: Family Medicine

## 2021-02-27 VITALS — BP 102/74 | Ht 70.0 in | Wt 132.0 lb

## 2021-02-27 DIAGNOSIS — Z7901 Long term (current) use of anticoagulants: Secondary | ICD-10-CM

## 2021-02-27 DIAGNOSIS — M25511 Pain in right shoulder: Secondary | ICD-10-CM

## 2021-02-27 DIAGNOSIS — C3491 Malignant neoplasm of unspecified part of right bronchus or lung: Secondary | ICD-10-CM | POA: Diagnosis not present

## 2021-02-27 MED ORDER — AMITRIPTYLINE HCL 50 MG PO TABS: 50.0000 mg | ORAL_TABLET | Freq: Every day | ORAL | 1 refills | Status: DC

## 2021-02-27 MED ORDER — METHYLPREDNISOLONE ACETATE 40 MG/ML IJ SUSP
40.0000 mg | Freq: Once | INTRAMUSCULAR | Status: AC
  Administered 2021-02-27: 40 mg via INTRA_ARTICULAR

## 2021-02-27 NOTE — Patient Instructions (Signed)
I think your shoulder pain is coming from 2 different areas, the sternoclavicular joint which is in the center of your chest and the rotator cuff.  T  oday I gave you a corticosteroid injection into the right shoulder to help with inflammation of the rotator cuff.  I looked at the sternoclavicular joint with ultrasound and it seems very inflamed.  Given your history of cancer in the right lung, I would like to get some more advanced imaging of this area and have set you up for CT scanning of the chest.    I would like to see you back in a week or 2 after you have had the CT scan and we can go over the results.  In the meantime, I   am also adding a medicine I want you to take before bedtime every day.  It may make you sleepy and it may give you a very slight dry mouth.  If you have any other problems with it, stop it and give [place] a call otherwise take it nightly until I see you back.  It was a pleasure to meet you!

## 2021-02-28 DIAGNOSIS — Z7901 Long term (current) use of anticoagulants: Secondary | ICD-10-CM | POA: Insufficient documentation

## 2021-02-28 DIAGNOSIS — M25511 Pain in right shoulder: Secondary | ICD-10-CM | POA: Insufficient documentation

## 2021-02-28 NOTE — Assessment & Plan Note (Signed)
Probably multifactorial and there is concern for post radiation effects as well, but will start with CSI into subacromial bursa to see if we can get him some relief. It is keeping him awake at night (in addition to SCJ pain) so will add amitriptyline qhs low dose. F/u after CT.

## 2021-02-28 NOTE — Progress Notes (Signed)
  David Berry. - 55 y.o. male MRN [id number]  Date of birth: [birth year]    SUBJECTIVE:      Chief Complaint:/ HPI:   Right shoulder and clavicle pain for several weeks. Not sure he had a specific injury. 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 No cannot raise right arm above shoulder level, difficulty reaching forward. Additionally is now hurting across anterior right chest along clavicle and notices clavicle is extremely tender and sore.  PERTINENT  PMH / PSH: I have reviewed the patient's medications, allergies, past medical and surgical history, smoking status.  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 On chronic anticoagulation with eliquis.    OBJECTIVE: BP 102/74   Ht 5\' 10"  (1.778 m)   Wt 132 lb (59.9 kg)   BMI 18.94 kg/m   Physical Exam:  Vital signs are reviewed. Chest: right [place] joint enlarged and very TTP. Mildly erythematous skin surrounding (? Prior RT changes) Right shoulder: mild ttp over bicep tendon but normal atrength testing and no pain with isolation biceps. FROM in all planes of rotator cuff. Significant pain with motion in supraspinatus planes.  [place]: right shoulder. Bicep tendon is seen well located in bicipital groove, no effusion. Supraspinatus muscle has some mild heterogeneity but is intact and attachments are well seen without fraying or tear. Subscapularis also normal in appearance [place] joint on right is surrounded by synovial cap and effusion and the  bony cortex is irregular.  PROCEDURE: INJECTION: Patient was given informed consent, signed copy in the chart. Appropriate time out was taken. Area prepped and draped in usual sterile fashion. Ethyl chloride was  used for local anesthesia. A 21 gauge  1 1/2 inch needle was used.. 1 cc of methylprednisolone 40 mg/ml plus  4 cc of 1% lidocaine without epinephrine was injected into the right subacromial bursa using a(n) posterior approach.   The patient tolerated the procedure well. There were no complications. Post procedure instructions were given.   ASSESSMENT & PLAN:  See problem based charting & AVS for pt instructions. Sternoclavicular joint pain, right Concern for metastatic disease given hx of mets. Wll get Ct scan (last was in July) and if this is not metastatic disease I would consider CSi into [place] joint. Discussed possibility of this being related to his lung cancer.  Right shoulder pain Probably multifactorial and there is concern for post radiation effects as well, but will start with CSI into subacromial bursa to see if we can get him some relief. It is keeping him awake at night (in addition to SCJ pain) so will add amitriptyline qhs low dose. F/u after CT.

## 2021-02-28 NOTE — Assessment & Plan Note (Signed)
Concern for metastatic disease given hx of mets. Wll get Ct scan (last was in July) and if this is not metastatic disease I would consider CSi into [place] joint. Discussed possibility of this being related to his lung cancer.

## 2021-03-04 ENCOUNTER — Inpatient Hospital Stay: Payer: Medicaid Other

## 2021-03-04 ENCOUNTER — Other Ambulatory Visit: Payer: Self-pay

## 2021-03-04 ENCOUNTER — Inpatient Hospital Stay (HOSPITAL_BASED_OUTPATIENT_CLINIC_OR_DEPARTMENT_OTHER): Payer: Medicaid Other | Admitting: Hematology

## 2021-03-04 VITALS — BP 106/71 | HR 83 | Temp 98.8°F | Resp 18 | Wt 135.2 lb

## 2021-03-04 DIAGNOSIS — C3491 Malignant neoplasm of unspecified part of right bronchus or lung: Secondary | ICD-10-CM

## 2021-03-04 DIAGNOSIS — Z298 Encounter for other specified prophylactic measures: Secondary | ICD-10-CM | POA: Diagnosis not present

## 2021-03-04 DIAGNOSIS — Z95828 Presence of other vascular implants and grafts: Secondary | ICD-10-CM

## 2021-03-04 DIAGNOSIS — Z7189 Other specified counseling: Secondary | ICD-10-CM

## 2021-03-04 DIAGNOSIS — Z5112 Encounter for antineoplastic immunotherapy: Secondary | ICD-10-CM | POA: Diagnosis not present

## 2021-03-04 LAB — CMP (CANCER CENTER ONLY)
ALT: 10 U/L (ref 0–44)
AST: 16 U/L (ref 15–41)
Albumin: 3.9 g/dL (ref 3.5–5.0)
Alkaline Phosphatase: 86 U/L (ref 38–126)
Anion gap: 10 (ref 5–15)
BUN: 9 mg/dL (ref 6–20)
CO2: 25 mmol/L (ref 22–32)
Calcium: 9.7 mg/dL (ref 8.9–10.3)
Chloride: 103 mmol/L (ref 98–111)
Creatinine: 0.83 mg/dL (ref 0.61–1.24)
GFR, Estimated: >60 mL/min (ref >60)
Glucose, Bld: 94 mg/dL (ref 70–99)
Potassium: 4.2 mmol/L (ref 3.5–5.1)
Sodium: 138 mmol/L (ref 135–145)
Total Bilirubin: 0.3 mg/dL (ref 0.3–1.2)
Total Protein: 7.2 g/dL (ref 6.5–8.1)

## 2021-03-04 LAB — CBC WITH DIFFERENTIAL/PLATELET
Abs Immature Granulocytes: 0.02 10*3/uL (ref 0.00–0.07)
Basophils Absolute: 0 10*3/uL (ref 0.0–0.1)
Basophils Relative: 1 %
Eosinophils Absolute: 0.1 10*3/uL (ref 0.0–0.5)
Eosinophils Relative: 1 %
HCT: 33 % — ABNORMAL LOW (ref 39.0–52.0)
Hemoglobin: 10.2 g/dL — ABNORMAL LOW (ref 13.0–17.0)
Immature Granulocytes: 0 %
Lymphocytes Relative: 21 %
Lymphs Abs: 1.3 10*3/uL (ref 0.7–4.0)
MCH: 23.3 pg — ABNORMAL LOW (ref 26.0–34.0)
MCHC: 30.9 g/dL (ref 30.0–36.0)
MCV: 75.5 fL — ABNORMAL LOW (ref 80.0–100.0)
Monocytes Absolute: 0.8 10*3/uL (ref 0.1–1.0)
Monocytes Relative: 13 %
Neutro Abs: 3.7 10*3/uL (ref 1.7–7.7)
Neutrophils Relative %: 64 %
Platelets: 296 10*3/uL (ref 150–400)
RBC: 4.37 MIL/uL (ref 4.22–5.81)
RDW: 16 % — ABNORMAL HIGH (ref 11.5–15.5)
WBC: 5.9 10*3/uL (ref 4.0–10.5)
nRBC: 0 % (ref 0.0–0.2)

## 2021-03-04 LAB — TSH: TSH: 0.942 u[IU]/mL (ref 0.320–4.118)

## 2021-03-04 MED ORDER — SODIUM CHLORIDE 0.9% FLUSH
10.0000 mL | INTRAVENOUS | Status: DC | PRN
  Administered 2021-03-04: 10 mL

## 2021-03-04 MED ORDER — ATEZOLIZUMAB CHEMO INJECTION 1200 MG/20ML
1200.0000 mg | Freq: Once | INTRAVENOUS | Status: AC
Start: 2021-03-04 — End: 2021-03-04
  Administered 2021-03-04: 1200 mg via INTRAVENOUS
  Filled 2021-03-04: qty 20

## 2021-03-04 MED ORDER — SILDENAFIL CITRATE 50 MG PO TABS: 50.0000 mg | ORAL_TABLET | Freq: Every day | ORAL | 1 refills | Status: DC | PRN

## 2021-03-04 MED ORDER — ACETAMINOPHEN 325 MG PO TABS
650.0000 mg | ORAL_TABLET | Freq: Once | ORAL | Status: AC
  Administered 2021-03-04: 650 mg via ORAL
  Filled 2021-03-04: qty 2

## 2021-03-04 MED ORDER — SODIUM CHLORIDE 0.9 % IV SOLN: Freq: Once | INTRAVENOUS | Status: AC

## 2021-03-04 MED ORDER — DIPHENHYDRAMINE HCL 25 MG PO CAPS
25.0000 mg | ORAL_CAPSULE | Freq: Once | ORAL | Status: AC
  Administered 2021-03-04: 25 mg via ORAL
  Filled 2021-03-04: qty 1

## 2021-03-04 MED ORDER — FAMOTIDINE 20 MG PO TABS
20.0000 mg | ORAL_TABLET | Freq: Once | ORAL | Status: AC
  Administered 2021-03-04: 20 mg via ORAL
  Filled 2021-03-04: qty 1

## 2021-03-04 MED ORDER — HEPARIN SOD (PORK) LOCK FLUSH 100 UNIT/ML IV SOLN
500.0000 [IU] | Freq: Once | INTRAVENOUS | Status: AC | PRN
  Administered 2021-03-04: 500 [IU]

## 2021-03-04 NOTE — Patient Instructions (Signed)
[facility]  Discharge Instructions: Thank you for choosing [facility] to provide your oncology and hematology care.   If you have a lab appointment with the [facility], please go directly to the [facility] and check in at the registration area.   Wear comfortable clothing and clothing appropriate for easy access to any Portacath or PICC line.   We strive to give you quality time with your provider. You may need to reschedule your appointment if you arrive late (15 or more minutes).  Arriving late affects you and other patients whose appointments are after yours.  Also, if you miss three or more appointments without notifying the office, you may be dismissed from the clinic at the provider's discretion.      For prescription refill requests, have your pharmacy contact our office and allow 72 hours for refills to be completed.    Today you received the following chemotherapy and/or immunotherapy agents: Tecentriq    To help prevent nausea and vomiting after your treatment, we encourage you to take your nausea medication as directed.  BELOW ARE SYMPTOMS THAT SHOULD BE REPORTED IMMEDIATELY: *FEVER GREATER THAN 100.4 F (38 C) OR HIGHER *CHILLS OR SWEATING *NAUSEA AND VOMITING THAT IS NOT CONTROLLED WITH YOUR NAUSEA MEDICATION *UNUSUAL SHORTNESS OF BREATH *UNUSUAL BRUISING OR BLEEDING *URINARY PROBLEMS (pain or burning when urinating, or frequent urination) *BOWEL PROBLEMS (unusual diarrhea, constipation, pain near the anus) TENDERNESS IN MOUTH AND THROAT WITH OR WITHOUT PRESENCE OF ULCERS (sore throat, sores in mouth, or a toothache) UNUSUAL RASH, SWELLING OR PAIN  UNUSUAL VAGINAL DISCHARGE OR ITCHING   Items with * indicate a potential emergency and should be followed up as soon as possible or go to the Emergency Department if any problems should occur.  Please show the CHEMOTHERAPY ALERT CARD or IMMUNOTHERAPY ALERT CARD at check-in to  the Emergency Department and triage nurse.  Should you have questions after your visit or need to cancel or reschedule your appointment, please contact [facility]  Dept: [phone]  and follow the prompts.  Office hours are 8:00 a.m. to 4:30 p.m. Monday - Friday. Please note that voicemails left after 4:00 p.m. may not be returned until the following business day.  We are closed weekends and major holidays. You have access to a nurse at all times for urgent questions. Please call the main number to the clinic Dept: [phone] and follow the prompts.   For any non-urgent questions, you may also contact your provider using MyChart. We now offer e-Visits for anyone 20 and older to request care online for non-urgent symptoms. For details visit [email or web address].   Also download the MyChart app! Go to the app store, search "MyChart", open the app, select [facility], and log in with your MyChart username and password.  Due to Covid, a mask is required upon entering the hospital/clinic. If you do not have a mask, one will be given to you upon arrival. For doctor visits, patients may have 1 support person aged 54 or older with them. For treatment visits, patients cannot have anyone with them due to current Covid guidelines and our immunocompromised population.

## 2021-03-05 LAB — TESTOSTERONE: Testosterone: 413 ng/dL (ref 264–916)

## 2021-03-09 ENCOUNTER — Telehealth: Payer: Self-pay | Admitting: Hematology

## 2021-03-09 NOTE — Telephone Encounter (Signed)
Scheduled follow-up appointments per 10/26 los. Patient is aware. 

## 2021-03-10 ENCOUNTER — Encounter: Payer: Self-pay | Admitting: Hematology

## 2021-03-10 NOTE — Progress Notes (Signed)
HEMATOLOGY/ONCOLOGY CLINIC NOTE  Date of Service: .03/04/2021   Patient Care Team: Deeann Saint, MD as PCP - General (Family Medicine)   CHIEF COMPLAINTS/PURPOSE OF CONSULTATION:  contnued management of Metastatic Poorly differentiated lung adenocarcinoma  HISTORY OF PRESENTING ILLNESS:   Mr. Main is a 55 year old male with no significant medical history.  He presented to the hospital with progressive right arm and chest pain with new right-sided neck swelling.  The patient states that he was diagnosed with carpal tunnel syndrome received an injection in August.  However, he continued to have progressive pain up and down his right arm that would also radiate to his right chest.  He took ibuprofen with no improvement.  1 week prior to admission, he developed new right sided neck swelling.  He presented to the emergency room for further evaluation.  Work-up in the emergency room was significant for a CT angiogram of the neck and chest which revealed a bulky soft tissue mass in the right neck continuing into the upper chest most compatible with extensive metastatic lymphadenopathy.  This mass measures up to 34 mm and encases multiple vessels including the right upper lobe pulmonary vessels, superior vena cava, and brachiocephalic vessel.  He also had a large poorly defined malignant appearing infiltrative mass measuring 6.2 x 5.6 x 7.9 cm within the right anterior and middle mediastinum with suspected invasion of the distal left brachiocephalic vein and probable tumor occlusion of the right subclavian and jugular veins.  There is also suspected tumor invasion of the upper SVC with string-like narrowing of the SVC but with patency of the SVC just above the right atrium.  The tumor abuts the aorta and great vessels and also results in significant narrowing of the right upper lobe pulmonary arterial vessels.  He also had a CT of the abdomen pelvis which showed a 15 mm heterogeneously enhancing  lesion in the upper pole of the right kidney concerning for renal cell carcinoma, no lymphadenopathy in the abdomen or pelvis, focal hyperenhancement of the anterior left liver most likely related to aberrant venous anatomy/flow, sclerotic lesions in the right acetabulum and left iliac bone are indeterminate.  These are likely bone islands but attention on follow-up is recommended a metastatic disease cannot be excluded.  MRI of the brain with and without contrast did not show any evidence of intracranial metastatic disease. The patient underwent a CT-guided biopsy in interventional radiology on 01/22/2019.  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   When seen today, patient reports ongoing pain and swelling to his right arm and right side of his neck.  Reports ongoing pain to his right chest.  He reports intermittent facial swelling which is worse in the morning and improves throughout the day.  He reports anorexia and a weight loss of 20 pounds over the past few weeks. He has had dizziness for the past few months.  Denies headaches.  He also noted that on the day of admission his peripheral vision was poor.  His vision is now improved.  He denies food getting stuck or difficulty swallowing but does feel as though his throat is "tight."  He reports a nonproductive cough.  Denies fevers and chills.  He is not really having any shortness of breath.  Denies abdominal pain, nausea, vomiting, constipation, diarrhea.  He has not noticed any epistaxis, hemoptysis, hematemesis, hematuria, melena, hematochezia.  The patient is single.  He has  1 daughter who lives in [place], [place].  Reports occasional alcohol use and currently smokes 3 cigars a day since 1997.  He is currently living in a rooming house with other roommates who all have their own room.  They share a common bathroom and kitchen.  Medical oncology was asked see  the patient to make recommendations regarding his newly diagnosed poorly differentiated carcinoma.  History reviewed. No pertinent past medical history.  Current Treatment:  Atezolizumab maintenance   INTERVAL HISTORY:   Carthel Castille. is a 55 y.o. male who is here for evaluation and management of metastatic poorly differentiated lung adenocarcinoma. He is here for his next cycle of maintenance Atezolizumab. The patient's last visit with [place] was on 01/21/2021. The pt reports that he is doing well overall.  The pt reports no acute new focal symptoms.  No acute shortness of breath or chest pain.  Weight has been stable. He reports that Viagra 25 mg was helpful in improving his ability to have an erection but it did not last as long as he would like.  He would like to try slightly higher dose and was prescribed 50 mg dose.  He was glad that it was helping. No lightheadedness or dizziness.  His improving p.o. intake needs Notes that he continues to have some intermittent hemorrhoidal bleeding.  Lab results today hemoglobin at 10.2 MCV of 75 WBC count normal at 5.9k platelets of 296k CMP within normal limits Testosterone within normal limits 413 TSH normal at 0.94  Patient notes he has been taking his oral iron regularly but notes that he is still having intermittent hemorrhoidal bleeding.  He is agreeable to replacing his iron deficiency IV to optimize his hemoglobin levels .   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[place])    pt denies this   GERD (gastroesophageal reflux disease)    Lung cancer ([place]) dx'd 01/2019   Peripheral vascular disease ([place])    blood clot in neck Sept 12, 2020    SURGICAL HISTORY: Past Surgical History:  Procedure Laterality Date   APPENDECTOMY     teenager   INGUINAL HERNIA REPAIR Right 2016, 2017   [place], 2018  [facility]-removed mesh   IR IMAGING GUIDED PORT INSERTION  04/26/2019   TOOTH EXTRACTION N/A 03/14/2020   Procedure: DENTAL RESTORATION/EXTRACTIONS;  Surgeon: Diona Browner, DDS;  Location: [facility];  Service: Oral Surgery;  Laterality: N/A;    SOCIAL HISTORY: Social History   Socioeconomic History   Marital status: Single    Spouse name: Not on file   Number of children: 1   Years of education: GED   Highest education level: Some college, no degree  Occupational History    Comment: fork lift driver  Tobacco Use   Smoking status: Former    Types: Cigars    Quit date: 01/20/2019    Years since quitting: 2.1   Smokeless tobacco: Never   Tobacco comments:    smokes 3 black and mild cigars per day  Substance and Sexual Activity   Alcohol use: Not Currently   Drug use: No   Sexual activity: Yes  Other Topics Concern   Not on file  Social History Narrative   Lives alone   Caffeine- coffee, 20 oz daily, tea occas   Social Determinants of Health  Financial Resource Strain: Low Risk    Difficulty of Paying Living Expenses: Not very hard  Food Insecurity: No Food Insecurity   Worried About Charity fundraiser in the Last Year: Never true   Ran Out of Food in the Last Year: Never true  Transportation Needs: No Transportation Needs   Lack of Transportation (Medical): No   Lack of Transportation (Non-Medical): No  Physical Activity: Sufficiently Active   Days of Exercise per Week: 3 days   Minutes of Exercise per Session: 150+ min  Stress: No Stress Concern Present   Feeling of Stress : Not at all  Social Connections: Socially Isolated   Frequency of Communication with Friends and Family: More than three times a week   Frequency of Social Gatherings with Friends and Family: Once a week   Attends Religious Services: Never   Marine scientist or Organizations: No   Attends Music therapist: Not on file   Marital Status: Never married  Human resources officer  Violence: Not on file    FAMILY HISTORY: Family History  Problem Relation Age of Onset   Prostate cancer Father     ALLERGIES:  is allergic to pregabalin.  MEDICATIONS:  Current Outpatient Medications  Medication Sig Dispense Refill   amitriptyline (ELAVIL) 50 MG tablet Take 1 tablet (50 mg total) by mouth at bedtime. 30 tablet 1   apixaban (ELIQUIS) 5 MG TABS tablet Take 1 tablet (5 mg total) by mouth 2 (two) times daily. 60 tablet 11   esomeprazole (NEXIUM) 40 MG capsule Take 1 capsule (40 mg total) by mouth daily. 90 capsule 2   iron polysaccharides (NIFEREX) 150 MG capsule Take 1 capsule (150 mg total) by mouth daily. 30 capsule 3   lidocaine-prilocaine (EMLA) cream Apply 1 application topically as needed. 30 g 0   sildenafil (VIAGRA) 50 MG tablet Take 1 tablet (50 mg total) by mouth daily as needed for erectile dysfunction. Take 30 mins to 4 hours prior to sexual activity 20 tablet 1   No current facility-administered medications for this visit.   REVIEW OF SYSTEMS:   .10 Point review of Systems was done is negative except as noted above.    PHYSICAL EXAMINATION: ECOG FS:1 - Symptomatic but completely ambulatory  Vitals:   03/04/21 1411  BP: 106/71  Pulse: 83  Resp: 18  Temp: 98.8 F (37.1 C)  SpO2: 98%    Wt Readings from Last 3 Encounters:  03/04/21 135 lb 3.2 oz (61.3 kg)  02/27/21 132 lb (59.9 kg)  02/06/21 132 lb 4 oz (60 kg)   Body mass index is 19.4 kg/m.    [name] GENERAL:alert, in no acute distress and comfortable SKIN: no acute rashes, no significant lesions EYES: conjunctiva are pink and non-injected, sclera anicteric OROPHARYNX: MMM, no exudates, no oropharyngeal erythema or ulceration NECK: supple, no JVD LYMPH:  no palpable lymphadenopathy in the cervical, axillary or inguinal regions LUNGS: clear to auscultation b/l with normal respiratory effort HEART: regular rate & rhythm ABDOMEN:  normoactive bowel sounds , non tender, not  distended. Extremity: no pedal edema PSYCH: alert & oriented x 3 with fluent speech NEURO: no focal motor/sensory deficits     LABORATORY DATA:  I have reviewed the data as listed  . CBC Latest Ref Rng & Units 03/04/2021 02/11/2021 01/21/2021  WBC 4.0 - 10.5 K/uL 5.9 4.7 8.1  Hemoglobin 13.0 - 17.0 g/dL 10.2(L) 9.6(L) 10.1(L)  Hematocrit 39.0 - 52.0 % 33.0(L) 32.3(L) 32.1(L)  Platelets 150 -  400 K/uL 296 289 354    . CMP Latest Ref Rng & Units 03/04/2021 02/11/2021 01/21/2021  Glucose 70 - 99 mg/dL 94 138(H) 106(H)  BUN 6 - 20 mg/dL _0 Creatinine 0.61 - 1.24 mg/dL 0.83 1.03 0.88  Sodium 135 - 145 mmol/L 138 139 138  Potassium 3.5 - 5.1 mmol/L 4.2 3.8 3.9  Chloride 98 - 111 mmol/L 103 106 102  CO2 22 - 32 mmol/L _1 Calcium 8.9 - 10.3 mg/dL 9.7 9.1 9.4  Total Protein 6.5 - 8.1 g/dL 7.2 6.7 6.6  Total Bilirubin 0.3 - 1.2 mg/dL 0.3 0.2(L) <0.2(L)  Alkaline Phos 38 - 126 U/L 86 82 75  AST 15 - 41 U/L 16 14(L) 12(L)  ALT 0 - 44 U/L _2 01/22/2019 [facility]: Tumor Mutational Burden     01/22/2019 PD-L1 Immunohistochemistry Analysis    01/22/2019 Soft Tissue Needle Core Biopsy Surgical Pathology     RADIOGRAPHIC STUDIES: I have personally reviewed the radiological images as listed and agreed with the findings in the report. [place] COMPLETE JOINT SPACE STRUCTURES UP RIGHT  Result Date: 02/28/2021 [place]: right shoulder and right Sternoclavicular joint. Bicep tendon is seen well located in bicipital groove, no effusion. Supraspinatus muscle has some mild heterogeneity but is intact and attachments are well seen without fraying or tear. Subscapularis also normal in appearance [place] joint on right is surrounded by synovial cap and effusion and the  bony cortex is irregular.   ASSESSMENT & PLAN:   This is a 55 year old male with   1. Metastatic Poorly differentiated lung adenocarcinoma Presented with Large neck mass, mediastinal mass, renal mass, and questionable  bone lesions  no brain mets on MRI brain 01/22/2019 PD-L1 Immunohistochemistry Analysis which revealed "Tumor Proportion Score (TPS) 50%" 05/16/2019 PET/CT ([id number]) revealed "Radiation changes in the right hemithorax, as above. Improving mediastinal nodal metastases. Prior bulky right cervical metastases have resolved. No evidence of metastatic disease in the abdomen/pelvis." 07/19/2019 Esophagus Scan ([id number]) revealed "Mild stricture at the C6-7 level likely related to prior esophageal surgery. Barium tablet was slow to pass through this area but did pass through after 1 minutes. Hiatal hernia with moderate gastroesophageal reflux. Moderate stricture above the hiatal hernia likely due to reflux. Barium tablet did not pass this area. There are changes of esophagitis which are likely due to reflux and possibly radiation as well."  10/10/19 of  CT Chest W Contrast ([id number])- no evidence of lung cancer progression at this time.   2. 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 Symptomatic hemorrhoids-currently no significant bleeding but have caused some mild iron deficiency anemia.  5. Normocytic anemia -Likely due to underlying malignancy   6. S/pThrombocytosis -- likely due to paraneoplastic effect of tumor and reactive due to inflammation and tissue inflammation from RT.-- now resolved.   7. Nicotine dependence -He was counseled about tobacco cessation.  -has quit since cancer diagnosis.  8. Iron deficiency - likely due to blood loss from hemorrhoids PLAN:  -Discussed pt labwork  Lab results today hemoglobin at 10.2 MCV of 75 WBC count normal at 5.9k platelets of 296k CMP within normal limits Testosterone within normal limits 413 TSH normal at 0.94 -Advised pt that he is in no evidence of disease status currently. -Recommended pt drink  48-64 oz of  water daily and given his low blood pressure to increase his p.o. salt intake. -Given a prescription for Ensure to try to increase his caloric intake -The pt has no prohibitive toxicities from continuing Atezolizumab at this time.  -PO iron polysaccharide 166m po daily. -Given history of anemia despite oral iron we will set him up for IV Venofer 200 mg every 3 weeks x 3 doses - orders placed  9. erectile dysfunction likely due to previous history of smoking and cancer related  Pains testosterone level adequate. Patient has no cardiac limitations that are known at this time. Plan -Given referral to urology for evaluation of erectile dysfunction. -He did have some benefits from Viagra and we increased the dose to 50 mg from 25 mg.   FOLLOW UP: Plz schedule next 4 - Atezolizumab treatments with portflush and labs Plz add IV Venofer to next 3 treatments MD visit every other treatment.    . The total time spent in the appointment was 30 minutes and more than 50% was on counseling and direct patient cares.    All of the patient's questions were answered with apparent satisfaction. The patient knows to call the clinic with any problems, questions or concerns.   GSullivan LoneMD MGreenwood LakeAAHIVMS SOjai Valley Community HospitalCPacific Rim Outpatient Surgery CenterHematology/Oncology Physician CJackson [facility]

## 2021-03-17 ENCOUNTER — Other Ambulatory Visit: Payer: Self-pay

## 2021-03-17 ENCOUNTER — Ambulatory Visit
Admission: RE | Admit: 2021-03-17 | Discharge: 2021-03-17 | Disposition: A | Discharge status: Home / Self Care | Payer: Medicaid Other | Source: Ambulatory Visit | Attending: Family Medicine | Admitting: Family Medicine

## 2021-03-17 DIAGNOSIS — C3490 Malignant neoplasm of unspecified part of unspecified bronchus or lung: Secondary | ICD-10-CM | POA: Diagnosis not present

## 2021-03-17 DIAGNOSIS — C3491 Malignant neoplasm of unspecified part of right bronchus or lung: Secondary | ICD-10-CM

## 2021-03-17 DIAGNOSIS — M25511 Pain in right shoulder: Secondary | ICD-10-CM

## 2021-03-17 DIAGNOSIS — J432 Centrilobular emphysema: Secondary | ICD-10-CM | POA: Diagnosis not present

## 2021-03-17 DIAGNOSIS — J8410 Pulmonary fibrosis, unspecified: Secondary | ICD-10-CM | POA: Diagnosis not present

## 2021-03-17 IMAGING — CT CT CHEST W/ CM
1 of 3 series · 11 of 31 positions shown, 14 images · IV contrast (APPLIED)
Comparison: CT chest abdomen pelvis, [DATE]

CLINICAL DATA: Metastatic lung cancer, right shoulder and scapular
pain, concern for bony metastases

EXAM:
CT CHEST WITH CONTRAST
TECHNIQUE: Multidetector CT imaging of the chest was performed during
intravenous contrast administration.
CONTRAST:  75mL [AU] IOPAMIDOL ([AU]) INJECTION 61%

[Series 2: chest w/cm · axial · 0.64mm/px · z∈[+906,+1202]mm · 11 of 182 slices shown, 14 images]
[im 17/182  mediastinal]
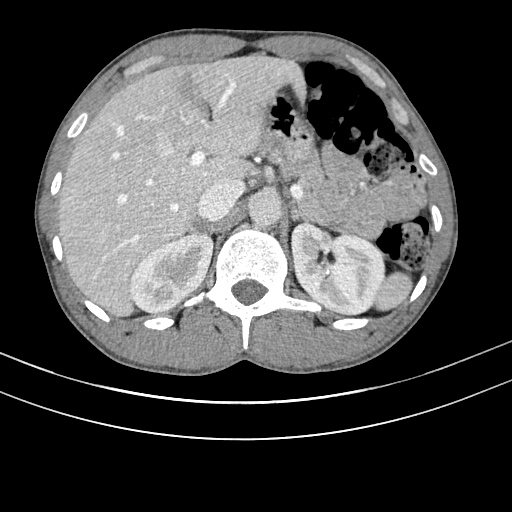
[im 17/182  lung]
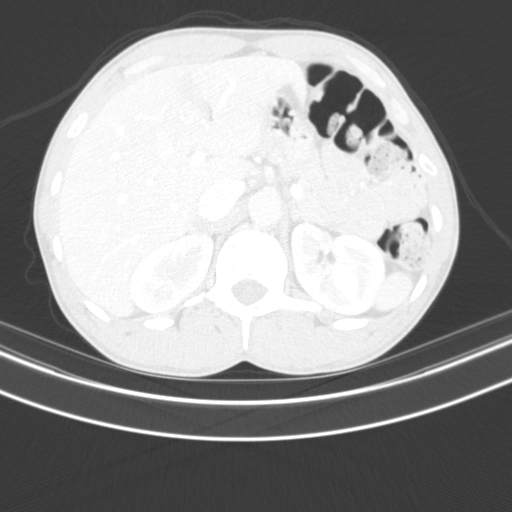
[im 33/182  lung]
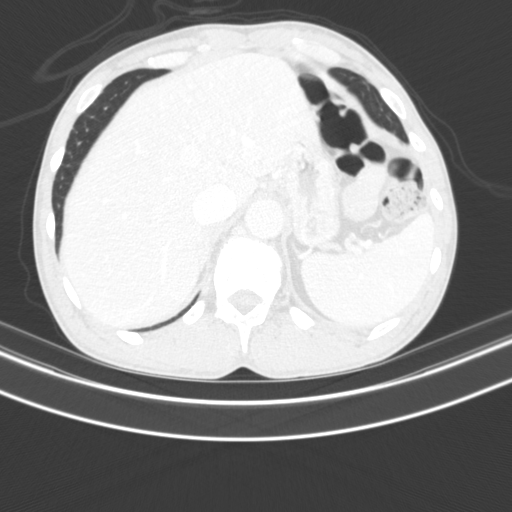
[im 50/182  lung]
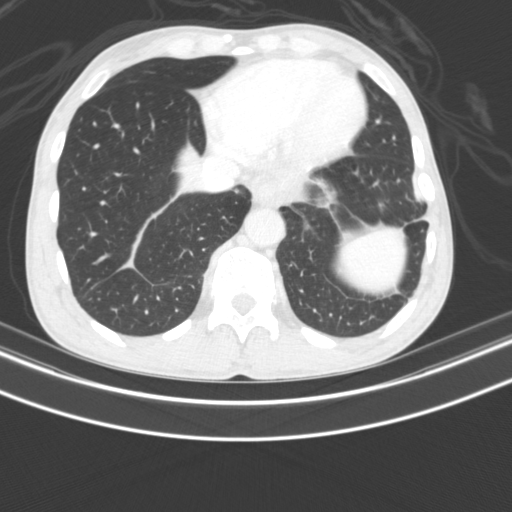
[im 66/182  lung]
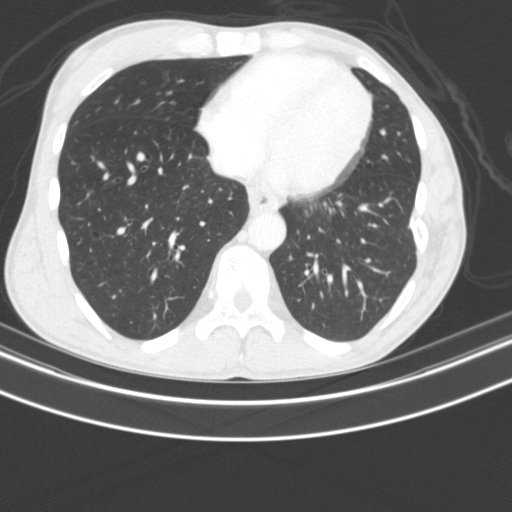
[im 83/182  mediastinal]
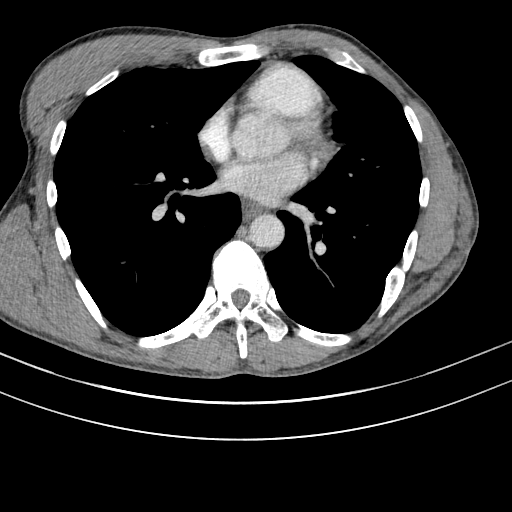
[im 83/182  lung]
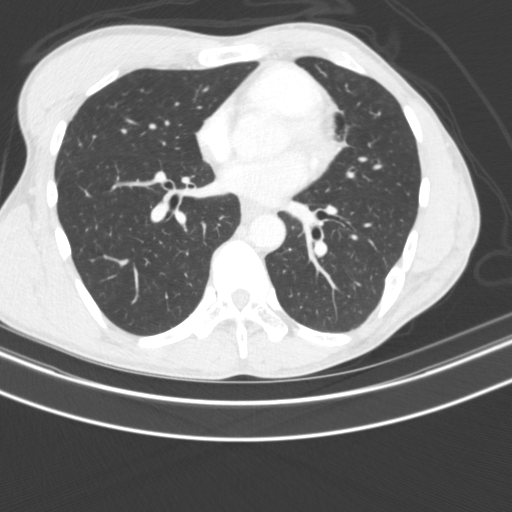
[im 87/182  lung]
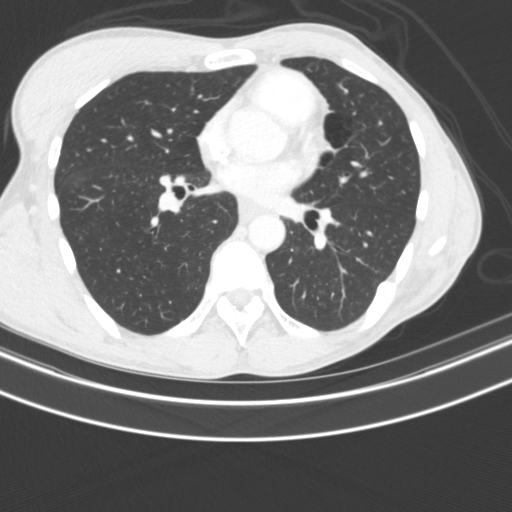
[im 99/182  lung]
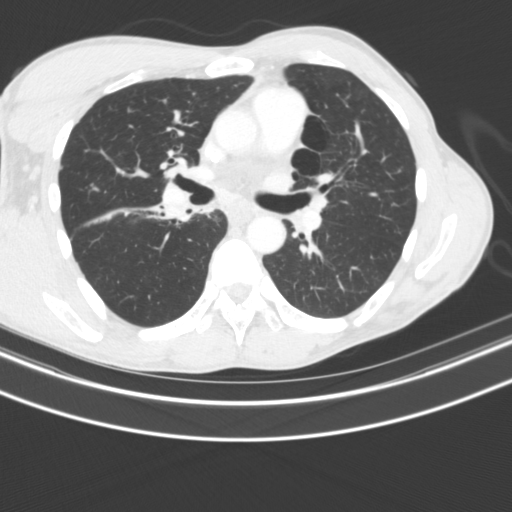
[im 116/182  lung]
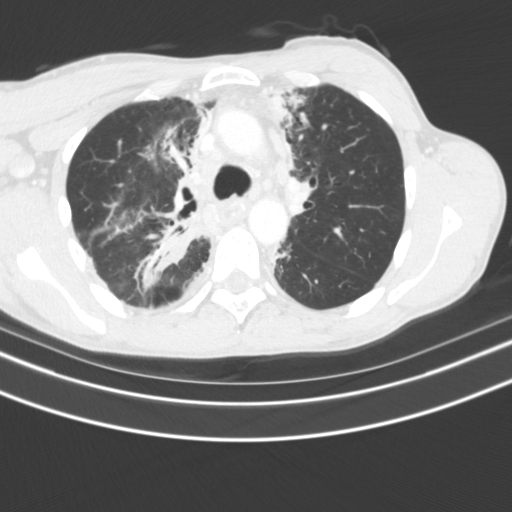
[im 132/182  mediastinal]
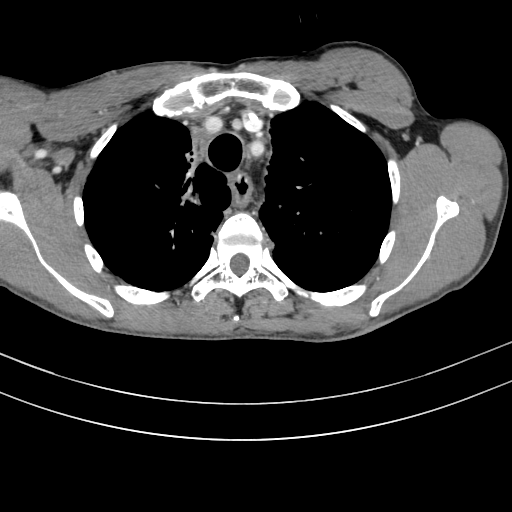
[im 132/182  lung]
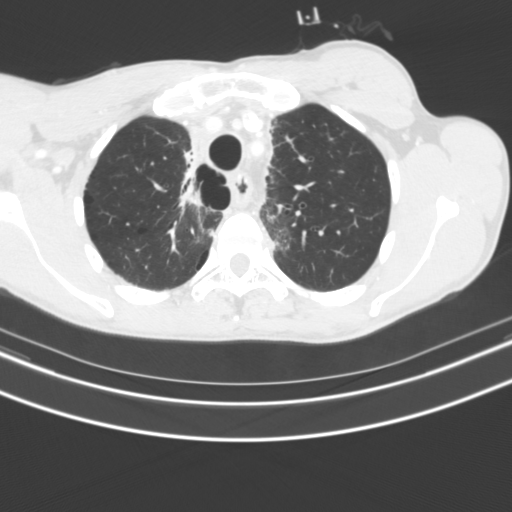
[im 149/182  lung]
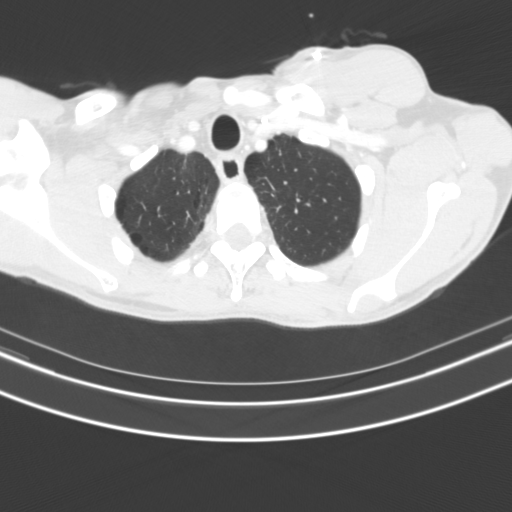
[im 165/182  lung]
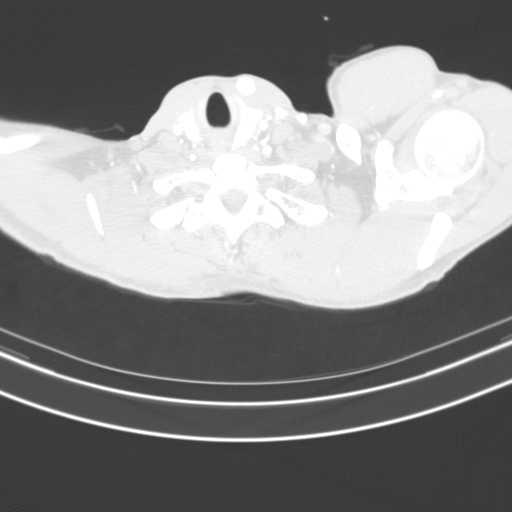

[11 of 31 positions shown; findings below may reference images not displayed]

FINDINGS: Cardiovascular: Left chest port catheter. Normal heart size. No
pericardial effusion.

Mediastinum/Nodes: Unchanged post treatment appearance of matted
pretracheal, subcarinal, and right hilar lymph nodes and soft
tissue, largest pretracheal node measuring approximately 1.2 x
cm although difficult to discretely appreciate (series 2, image 62).
Diffuse thickening of the esophagus. Thyroid gland and trachea
demonstrate no significant findings.

Lungs/Pleura: Mild-to-moderate centrilobular and paraseptal
emphysema. Unchanged post treatment appearance of the chest, with
right greater than left suprahilar and paramedian fibrosis and
consolidation. No pleural effusion or pneumothorax.

Upper Abdomen: No acute abnormality. Interval enlargement of
low-attenuation right adrenal nodules, measuring 1.8 x 1.2 cm,
previously no larger than 1.0 cm (series 2, image 156) and 1.3 x
cm, previously no larger than 0.9 x 0.5 cm (series 2, image 167).
Probable fat containing angiomyolipoma of the lateral midportion of
the right kidney (series 2, image 173).

Musculoskeletal: No chest wall mass or suspicious bone lesions
identified. No CT abnormality of the included right scapula or
proximal right humerus per ordering indication.
IMPRESSION: 1. Unchanged post treatment appearance of the chest, with right
greater than left suprahilar and paramedian fibrosis and
consolidation.
2. Unchanged post treatment appearance of matted pretracheal,
subcarinal, and right hilar lymph nodes and soft tissue.
3. Interval enlargement of low-attenuation right adrenal nodules,
worrisome for adrenal metastatic disease. These could be
characterized for metabolic activity by PET-CT if desired.
4. No CT abnormality of the included right scapula or proximal right
humerus to explain pain per ordering indication.
5. Diffuse thickening of the esophagus, which may be related to
reflux esophagitis or prior radiation therapy.
6. Emphysema.

These results will be called to the ordering clinician or
representative by the Radiologist Assistant, and communication
documented in the PACS or [REDACTED].

Emphysema ([AU]-[AU]).

## 2021-03-17 MED ORDER — HEPARIN SOD (PORK) LOCK FLUSH 100 UNIT/ML IV SOLN
500.0000 [IU] | Freq: Once | INTRAVENOUS | Status: AC
  Administered 2021-03-17: 500 [IU] via INTRAVENOUS

## 2021-03-17 MED ORDER — IOPAMIDOL (ISOVUE-300) INJECTION 61%
75.0000 mL | Freq: Once | INTRAVENOUS | Status: AC | PRN
  Administered 2021-03-17: 75 mL via INTRAVENOUS

## 2021-03-17 MED ORDER — SODIUM CHLORIDE 0.9% FLUSH
10.0000 mL | INTRAVENOUS | Status: DC | PRN
  Administered 2021-03-17: 10 mL via INTRAVENOUS

## 2021-03-18 ENCOUNTER — Telehealth: Payer: Self-pay | Admitting: Family Medicine

## 2021-03-18 NOTE — Telephone Encounter (Signed)
I spoke with patient regarding his CT scan results.  We had gotten a CT to investigate whether or not his increasing shoulder pain was in any way related to his previously known adenocarcinoma of the lung.  It does not look like there is anything new   in the chest related to his lung cancer that would be causing his shoulder pain.  However, there is an area on the adrenal gland that is suspicious for metastatic disease.  I talked to him about this today and he has an appointment with his oncologist, Dr. Irene Limbo on the 16th.  I will route the CT scan results to his oncologist and I will send David Berry like copy in the mail.  He will discuss the adrenal lesions with his oncologist.  His shoulder is a little bit better after the injection but still painful.  I urged him to make an appointment to see me sometime in December and we would reevaluate his shoulder.  He also tells me he is having a lot of hand trembling on the right side.  It so severe at times that he cannot pour sugar into his coffee.  This is a new or certainly more severe symptom that has had in the past.  I am not clear what this could be.  I will follow him up in sports medicine.

## 2021-03-25 ENCOUNTER — Inpatient Hospital Stay: Payer: Medicaid Other

## 2021-03-25 ENCOUNTER — Inpatient Hospital Stay: Payer: Medicaid Other | Attending: Physician Assistant

## 2021-03-25 ENCOUNTER — Inpatient Hospital Stay (HOSPITAL_BASED_OUTPATIENT_CLINIC_OR_DEPARTMENT_OTHER): Payer: Medicaid Other | Admitting: Hematology

## 2021-03-25 ENCOUNTER — Other Ambulatory Visit: Payer: Self-pay

## 2021-03-25 ENCOUNTER — Inpatient Hospital Stay: Payer: Medicaid Other | Admitting: Dietician

## 2021-03-25 VITALS — BP 114/68 | HR 79 | Temp 97.7°F | Resp 20 | Wt 133.5 lb

## 2021-03-25 DIAGNOSIS — Z7901 Long term (current) use of anticoagulants: Secondary | ICD-10-CM | POA: Diagnosis not present

## 2021-03-25 DIAGNOSIS — D649 Anemia, unspecified: Secondary | ICD-10-CM | POA: Insufficient documentation

## 2021-03-25 DIAGNOSIS — C3490 Malignant neoplasm of unspecified part of unspecified bronchus or lung: Secondary | ICD-10-CM | POA: Diagnosis present

## 2021-03-25 DIAGNOSIS — C3491 Malignant neoplasm of unspecified part of right bronchus or lung: Secondary | ICD-10-CM

## 2021-03-25 DIAGNOSIS — E278 Other specified disorders of adrenal gland: Secondary | ICD-10-CM

## 2021-03-25 DIAGNOSIS — Z7189 Other specified counseling: Secondary | ICD-10-CM

## 2021-03-25 DIAGNOSIS — R200 Anesthesia of skin: Secondary | ICD-10-CM | POA: Diagnosis not present

## 2021-03-25 DIAGNOSIS — Z95828 Presence of other vascular implants and grafts: Secondary | ICD-10-CM

## 2021-03-25 DIAGNOSIS — C771 Secondary and unspecified malignant neoplasm of intrathoracic lymph nodes: Secondary | ICD-10-CM | POA: Diagnosis not present

## 2021-03-25 DIAGNOSIS — Z5112 Encounter for antineoplastic immunotherapy: Secondary | ICD-10-CM

## 2021-03-25 DIAGNOSIS — Z86718 Personal history of other venous thrombosis and embolism: Secondary | ICD-10-CM | POA: Insufficient documentation

## 2021-03-25 DIAGNOSIS — Z79899 Other long term (current) drug therapy: Secondary | ICD-10-CM | POA: Diagnosis not present

## 2021-03-25 LAB — CMP (CANCER CENTER ONLY)
ALT: 10 U/L (ref 0–44)
AST: 14 U/L — ABNORMAL LOW (ref 15–41)
Albumin: 3.7 g/dL (ref 3.5–5.0)
Alkaline Phosphatase: 93 U/L (ref 38–126)
Anion gap: 7 (ref 5–15)
BUN: 9 mg/dL (ref 6–20)
CO2: 27 mmol/L (ref 22–32)
Calcium: 9.4 mg/dL (ref 8.9–10.3)
Chloride: 106 mmol/L (ref 98–111)
Creatinine: 0.85 mg/dL (ref 0.61–1.24)
GFR, Estimated: >60 mL/min (ref >60)
Glucose, Bld: 94 mg/dL (ref 70–99)
Potassium: 4.2 mmol/L (ref 3.5–5.1)
Sodium: 140 mmol/L (ref 135–145)
Total Bilirubin: <0.2 mg/dL — ABNORMAL LOW (ref 0.3–1.2)
Total Protein: 7.4 g/dL (ref 6.5–8.1)

## 2021-03-25 LAB — CBC WITH DIFFERENTIAL/PLATELET
Abs Immature Granulocytes: 0.02 10*3/uL (ref 0.00–0.07)
Basophils Absolute: 0 10*3/uL (ref 0.0–0.1)
Basophils Relative: 0 %
Eosinophils Absolute: 0.1 10*3/uL (ref 0.0–0.5)
Eosinophils Relative: 1 %
HCT: 33.9 % — ABNORMAL LOW (ref 39.0–52.0)
Hemoglobin: 10.3 g/dL — ABNORMAL LOW (ref 13.0–17.0)
Immature Granulocytes: 0 %
Lymphocytes Relative: 18 %
Lymphs Abs: 1 10*3/uL (ref 0.7–4.0)
MCH: 23.3 pg — ABNORMAL LOW (ref 26.0–34.0)
MCHC: 30.4 g/dL (ref 30.0–36.0)
MCV: 76.5 fL — ABNORMAL LOW (ref 80.0–100.0)
Monocytes Absolute: 0.8 10*3/uL (ref 0.1–1.0)
Monocytes Relative: 15 %
Neutro Abs: 3.7 10*3/uL (ref 1.7–7.7)
Neutrophils Relative %: 66 %
Platelets: 475 10*3/uL — ABNORMAL HIGH (ref 150–400)
RBC: 4.43 MIL/uL (ref 4.22–5.81)
RDW: 15.9 % — ABNORMAL HIGH (ref 11.5–15.5)
WBC: 5.6 10*3/uL (ref 4.0–10.5)
nRBC: 0 % (ref 0.0–0.2)

## 2021-03-25 MED ORDER — SODIUM CHLORIDE 0.9% FLUSH
10.0000 mL | INTRAVENOUS | Status: DC | PRN
  Administered 2021-03-25: 10 mL

## 2021-03-25 MED ORDER — ACETAMINOPHEN 325 MG PO TABS
650.0000 mg | ORAL_TABLET | Freq: Once | ORAL | Status: AC
  Administered 2021-03-25: 650 mg via ORAL
  Filled 2021-03-25: qty 2

## 2021-03-25 MED ORDER — LORATADINE 10 MG PO TABS: 10.0000 mg | ORAL_TABLET | Freq: Once | ORAL | Status: DC

## 2021-03-25 MED ORDER — SODIUM CHLORIDE 0.9 % IV SOLN
1200.0000 mg | Freq: Once | INTRAVENOUS | Status: AC
  Administered 2021-03-25: 1200 mg via INTRAVENOUS
  Filled 2021-03-25: qty 20

## 2021-03-25 MED ORDER — DIPHENHYDRAMINE HCL 25 MG PO CAPS
25.0000 mg | ORAL_CAPSULE | Freq: Once | ORAL | Status: AC
  Administered 2021-03-25: 25 mg via ORAL
  Filled 2021-03-25: qty 1

## 2021-03-25 MED ORDER — SODIUM CHLORIDE 0.9 % IV SOLN
200.0000 mg | Freq: Once | INTRAVENOUS | Status: AC
  Administered 2021-03-25: 200 mg via INTRAVENOUS
  Filled 2021-03-25: qty 200

## 2021-03-25 MED ORDER — HEPARIN SOD (PORK) LOCK FLUSH 100 UNIT/ML IV SOLN
500.0000 [IU] | Freq: Once | INTRAVENOUS | Status: AC | PRN
  Administered 2021-03-25: 500 [IU]

## 2021-03-25 MED ORDER — SODIUM CHLORIDE 0.9 % IV SOLN: Freq: Once | INTRAVENOUS | Status: AC

## 2021-03-25 MED ORDER — FAMOTIDINE 20 MG PO TABS
20.0000 mg | ORAL_TABLET | Freq: Once | ORAL | Status: AC
  Administered 2021-03-25: 20 mg via ORAL
  Filled 2021-03-25: qty 1

## 2021-03-25 NOTE — Progress Notes (Signed)
Nutrition Follow-up:  Patient receiving maintenance Tecentriq for metastatic lung cancer.   Met with patient during infusion. He reports appetite is good and eating a variety of foods (egg/cheese sandwiches, hamburgers, mashed potatoes, gravy, chicken, greens). He is drinking 1 bottle of water and 2 Boost Plus/day. He tolerates these well. Patient is receiving monthly shipment of supplement, reports strawberry flavor was discontinued but likes chocolate and vanilla just as good. He reports intermittent nausea ongoing, Ativan works well for him. Patient reports occasional constipation.   Medications: reviewed   Labs: reviewed   Anthropometrics: Weight 133 lb 8 oz today stable   10/21 - 132 lb 9/30 - 132 lb 4 oz 9/14 - 133 lb  8/24 - 132 lb 4 oz    NUTRITION DIAGNOSIS: Unintended weight loss stable    INTERVENTION:  Continue strategies for increasing calories and protein with small frequent meals and snacks Continue drinking 2 Boost Plus daily - pt receiving 2 cases of Boost/month via Medicaid ONS program Discusses strategies for constipation  Patient will work to increase water intake Continue Ativan as needed for nausea Patient has contact information     MONITORING, EVALUATION, GOAL: weight trends, intake   NEXT VISIT: To be scheduled as needed with treatment

## 2021-03-25 NOTE — Patient Instructions (Signed)
[facility] ONCOLOGY  Discharge Instructions: Thank you for choosing [facility] to provide your oncology and hematology care.   If you have a lab appointment with the [facility], please go directly to the [facility] and check in at the registration area.   Wear comfortable clothing and clothing appropriate for easy access to any Portacath or PICC line.   We strive to give you quality time with your provider. You may need to reschedule your appointment if you arrive late (15 or more minutes).  Arriving late affects you and other patients whose appointments are after yours.  Also, if you miss three or more appointments without notifying the office, you may be dismissed from the clinic at the provider's discretion.      For prescription refill requests, have your pharmacy contact our office and allow 72 hours for refills to be completed.    Today you received the following chemotherapy and/or immunotherapy agents atezolizumab      To help prevent nausea and vomiting after your treatment, we encourage you to take your nausea medication as directed.  BELOW ARE SYMPTOMS THAT SHOULD BE REPORTED IMMEDIATELY: *FEVER GREATER THAN 100.4 F (38 C) OR HIGHER *CHILLS OR SWEATING *NAUSEA AND VOMITING THAT IS NOT CONTROLLED WITH YOUR NAUSEA MEDICATION *UNUSUAL SHORTNESS OF BREATH *UNUSUAL BRUISING OR BLEEDING *URINARY PROBLEMS (pain or burning when urinating, or frequent urination) *BOWEL PROBLEMS (unusual diarrhea, constipation, pain near the anus) TENDERNESS IN MOUTH AND THROAT WITH OR WITHOUT PRESENCE OF ULCERS (sore throat, sores in mouth, or a toothache) UNUSUAL RASH, SWELLING OR PAIN  UNUSUAL VAGINAL DISCHARGE OR ITCHING   Items with * indicate a potential emergency and should be followed up as soon as possible or go to the Emergency Department if any problems should occur.  Please show the CHEMOTHERAPY ALERT CARD or IMMUNOTHERAPY ALERT CARD at check-in  to the Emergency Department and triage nurse.  Should you have questions after your visit or need to cancel or reschedule your appointment, please contact [facility]  Dept: [phone]  and follow the prompts.  Office hours are 8:00 a.m. to 4:30 p.m. Monday - Friday. Please note that voicemails left after 4:00 p.m. may not be returned until the following business day.  We are closed weekends and major holidays. You have access to a nurse at all times for urgent questions. Please call the main number to the clinic Dept: [phone] and follow the prompts.   For any non-urgent questions, you may also contact your provider using MyChart. We now offer e-Visits for anyone 57 and older to request care online for non-urgent symptoms. For details visit [email or web address].   Also download the MyChart app! Go to the app store, search "MyChart", open the app, select [facility], and log in with your MyChart username and password.  Due to Covid, a mask is required upon entering the hospital/clinic. If you do not have a mask, one will be given to you upon arrival. For doctor visits, patients may have 1 support person aged 12 or older with them. For treatment visits, patients cannot have anyone with them due to current Covid guidelines and our immunocompromised population.

## 2021-03-27 ENCOUNTER — Ambulatory Visit: Payer: Medicaid Other | Admitting: Family Medicine

## 2021-03-27 VITALS — BP 95/58 | Ht 70.0 in | Wt 133.0 lb

## 2021-03-27 DIAGNOSIS — R251 Tremor, unspecified: Secondary | ICD-10-CM

## 2021-03-27 DIAGNOSIS — M25511 Pain in right shoulder: Secondary | ICD-10-CM | POA: Diagnosis not present

## 2021-03-27 NOTE — Progress Notes (Signed)
David Berry. - 55 y.o. male MRN [id number]  Date of birth: [birth year]  SUBJECTIVE:   CC: R shoulder f/u and R hand tremor   David Berry. 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  Patient last seen 02/27/2021, shoulder pain thought to be multifactorial with possible concern for postradiation effects given history of mets.  CT scan chest ordered which did not show structural changes of the right shoulder to explain right shoulder pain.  At that OV patient also received subacromial bursa CSI.  Patient reports today significant improvement in right shoulder pain.  He reports the steroid injection provided significant improvement in pain.  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  Since he is no longer working he has had continued improvement of the shoulder pain.  He reports his main complaint today is the right hand tremor.  He first noticed the tremor about 1 year ago and has recently worsened.  He reports trembling when he is holding objects such as a cell phone, when he is bringing utensils to his mouth to eat.  He has discussed this with his PCP and has discussed the possibility of seeing a neurologist for this.   ROS: Negative aside from mentioned previously in HPI   DATA REVIEWED: CT CHEST WITH CONTRAST IMPRESSION: 1. Unchanged post treatment appearance of the chest, with right greater than left suprahilar and paramedian fibrosis and consolidation. 2. Unchanged post treatment appearance of matted pretracheal, subcarinal, and right hilar lymph nodes and soft tissue. 3. Interval enlargement of  low-attenuation right adrenal nodules, worrisome for adrenal metastatic disease. These could be characterized for metabolic activity by PET-CT if desired. 4. No CT abnormality of the included right scapula or proximal right humerus to explain pain per ordering indication. 5. Diffuse thickening of the esophagus, which may be related to reflux esophagitis or prior radiation therapy. 6. Emphysema. Read by: Delanna Ahmadi M.D.     PHYSICAL EXAM:  VS: BP:(!) 95/58  HR: bpm  TEMP: ( )  RESP:   HT:5\' 10"  (177.8 cm)   WT:133 lb (60.3 kg)  BMI:19.08 PHYSICAL EXAM: Gen: NAD, alert, cooperative with exam, well-appearing HEENT: clear conjunctiva,  CV:  no edema, capillary refill brisk, normal rate Resp: non-labored Skin: no rashes, normal turgor  Neuro: no gross deficits.  Psych:  alert and oriented MSK: Shoulder active ROM intact bilaterally without pain Neuro: Awake, alert, oriented x3.  Speech and language intact.  Memory grossly intact.  Cranial nerves grossly intact.  Negative postural tremor.  Negative rest tremor.  Negative rest tremor with distraction.  Negative cerebellar tremor (FTN intact bilaterally).  RUE action tremor while holding cell phone, holding pen.  Mild difficulty with handwriting with right upper extremity.   MinimalLUE action tremor.  No bradykinesia with finger taps bilaterally.  No rigidity RUE.  ASSESSMENT & PLAN:   [name]. 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   Right shoulder pain, multifactorial: Shoulder pain is significantly improved after steroid injection and discontinuing cleaning job.  Patient can continue conservative management as needed and follow-up on an as-needed  basis.  Right hand tremor: Patient had mild right upper extremity action tremor, however minimal left-sided action tremor on exam, no tremor present in bilateral lower extremities.    ent.   David Denis, MD 03/27/21,  11:00 AM Pager: [phone] Internal Medicine Resident, PGY-1 [facility]

## 2021-03-31 ENCOUNTER — Encounter: Payer: Self-pay | Admitting: Hematology

## 2021-04-06 NOTE — Addendum Note (Signed)
Addended by: Sullivan Lone on: 04/06/2021 05:23 PM   Modules accepted: Orders

## 2021-04-06 NOTE — Progress Notes (Signed)
HEMATOLOGY/ONCOLOGY CLINIC NOTE  Date of Service: .03/25/2021   Patient Care Team: Deeann Saint, MD as PCP - General (Family Medicine)   CHIEF COMPLAINTS/PURPOSE OF CONSULTATION:  contnued management of Metastatic Poorly differentiated lung adenocarcinoma  HISTORY OF PRESENTING ILLNESS:   David Berry is a 55 year old male with no significant medical history.  He presented to the hospital with progressive right arm and chest pain with new right-sided neck swelling.  The patient states that he was diagnosed with carpal tunnel syndrome received an injection in August.  However, he continued to have progressive pain up and down his right arm that would also radiate to his right chest.  He took ibuprofen with no improvement.  1 week prior to admission, he developed new right sided neck swelling.  He presented to the emergency room for further evaluation.  Work-up in the emergency room was significant for a CT angiogram of the neck and chest which revealed a bulky soft tissue mass in the right neck continuing into the upper chest most compatible with extensive metastatic lymphadenopathy.  This mass measures up to 34 mm and encases multiple vessels including the right upper lobe pulmonary vessels, superior vena cava, and brachiocephalic vessel.  He also had a large poorly defined malignant appearing infiltrative mass measuring 6.2 x 5.6 x 7.9 cm within the right anterior and middle mediastinum with suspected invasion of the distal left brachiocephalic vein and probable tumor occlusion of the right subclavian and jugular veins.  There is also suspected tumor invasion of the upper SVC with string-like narrowing of the SVC but with patency of the SVC just above the right atrium.  The tumor abuts the aorta and great vessels and also results in significant narrowing of the right upper lobe pulmonary arterial vessels.  He also had a CT of the abdomen pelvis which showed a 15 mm heterogeneously enhancing  lesion in the upper pole of the right kidney concerning for renal cell carcinoma, no lymphadenopathy in the abdomen or pelvis, focal hyperenhancement of the anterior left liver most likely related to aberrant venous anatomy/flow, sclerotic lesions in the right acetabulum and left iliac bone are indeterminate.  These are likely bone islands but attention on follow-up is recommended a metastatic disease cannot be excluded.  MRI of the brain with and without contrast did not show any evidence of intracranial metastatic disease. The patient underwent a CT-guided biopsy in interventional radiology on 01/22/2019.  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   When seen today, patient reports ongoing pain and swelling to his right arm and right side of his neck.  Reports ongoing pain to his right chest.  He reports intermittent facial swelling which is worse in the morning and improves throughout the day.  He reports anorexia and a weight loss of 20 pounds over the past few weeks. He has had dizziness for the past few months.  Denies headaches.  He also noted that on the day of admission his peripheral vision was poor.  His vision is now improved.  He denies food getting stuck or difficulty swallowing but does feel as though his throat is "tight."  He reports a nonproductive cough.  Denies fevers and chills.  He is not really having any shortness of breath.  Denies abdominal pain, nausea, vomiting, constipation, diarrhea.  He has not noticed any epistaxis, hemoptysis, hematemesis, hematuria, melena, hematochezia.  The patient is single.  He has  1 daughter who lives in [place], [place].  Reports occasional alcohol use and currently smokes 3 cigars a day since 1997.  He is currently living in a rooming house with other roommates who all have their own room.  They share a common bathroom and kitchen.  Medical oncology was asked see  the patient to make recommendations regarding his newly diagnosed poorly differentiated carcinoma.  History reviewed. No pertinent past medical history.  Current Treatment:  Atezolizumab maintenance   INTERVAL HISTORY:   David Merrow. is a 55 y.o. male who is here for evaluation and management of metastatic poorly differentiated lung adenocarcinoma. He is here for his next cycle of maintenance Atezolizumab. The patient's last visit with [place] was on 03/04/2021.   The pt reports he had a follow-up with his primary care physician David Mussel MD and reported right scapular pain for which she had a CT of the chest with contrast on 03/17/2021 which showed 1. Unchanged post treatment appearance of the chest, with right greater than left suprahilar and paramedian fibrosis and consolidation. 2. Unchanged post treatment appearance of matted pretracheal, subcarinal, and right hilar lymph nodes and soft tissue. 3. Interval enlargement of low-attenuation right adrenal nodules, worrisome for adrenal metastatic disease. These could be characterized for metabolic activity by PET-CT if desired. 4. No CT abnormality of the included right scapula or proximal right humerus to explain pain per ordering indication. 5. Diffuse thickening of the esophagus, which may be related to reflux esophagitis or prior radiation therapy. 6. Emphysema.  We discussed and patient is agreeable to to get a PET CT scan which has been ordered. Patient notes no new abdominal pain.  No new shortness of breath.  No chest pain. He notes good p.o. intake.  Lab results today 03/25/2021 CBC shows hemoglobin of 10.3 with an MCV of 76.5 WBC count of 5.6k platelets of 475 k CMP normal limits.  Patient notes he tried Viagra at 50 mg and tolerated this well with some improvement in his erection.   MEDICAL HISTORY:  Past Medical History:  Diagnosis Date   Anxiety    Bipolar disorder ([place])    Blood in stool    Cancer ([place])     Chronic bronchitis with emphysema ([place])    pt denies this   GERD (gastroesophageal reflux disease)    Lung cancer ([place]) dx'd 01/2019   Peripheral vascular disease ([place])    blood clot in neck Sept 12, 2020    SURGICAL HISTORY: Past Surgical History:  Procedure Laterality Date   APPENDECTOMY     teenager   INGUINAL HERNIA REPAIR Right 2016, 2017   [place], 2018 [facility]-removed mesh   IR IMAGING GUIDED PORT INSERTION  04/26/2019   TOOTH EXTRACTION N/A 03/14/2020   Procedure: DENTAL RESTORATION/EXTRACTIONS;  Surgeon: Diona Browner, DDS;  Location: [facility];  Service: Oral Surgery;  Laterality: N/A;    SOCIAL HISTORY: Social History   Socioeconomic History   Marital status: Single    Spouse name: Not on file   Number of children: 1   Years of education: GED   Highest education level: Some college, no degree  Occupational History    Comment: fork lift driver  Tobacco Use   Smoking status: Former    Types: Cigars    Quit date: 01/20/2019    Years since quitting: 2.2   Smokeless tobacco: Never   Tobacco comments:    smokes 3 black and mild cigars per day  Substance and Sexual  Activity   Alcohol use: Not Currently   Drug use: No   Sexual activity: Yes  Other Topics Concern   Not on file  Social History Narrative   Lives alone   Caffeine- coffee, 20 oz daily, tea occas   Social Determinants of Health   Financial Resource Strain: Low Risk    Difficulty of Paying Living Expenses: Not very hard  Food Insecurity: No Food Insecurity   Worried About Charity fundraiser in the Last Year: Never true   Ran Out of Food in the Last Year: Never true  Transportation Needs: No Transportation Needs   Lack of Transportation (Medical): No   Lack of Transportation (Non-Medical): No  Physical Activity: Sufficiently Active   Days of Exercise per Week: 3 days   Minutes of Exercise per Session: 150+ min  Stress: No Stress Concern Present   Feeling of Stress : Not at all  Social  Connections: Socially Isolated   Frequency of Communication with Friends and Family: More than three times a week   Frequency of Social Gatherings with Friends and Family: Once a week   Attends Religious Services: Never   Marine scientist or Organizations: No   Attends Music therapist: Not on file   Marital Status: Never married  Human resources officer Violence: Not on file    FAMILY HISTORY: Family History  Problem Relation Age of Onset   Prostate cancer Father     ALLERGIES:  is allergic to pregabalin.  MEDICATIONS:  Current Outpatient Medications  Medication Sig Dispense Refill   amitriptyline (ELAVIL) 50 MG tablet Take 1 tablet (50 mg total) by mouth at bedtime. 30 tablet 1   apixaban (ELIQUIS) 5 MG TABS tablet Take 1 tablet (5 mg total) by mouth 2 (two) times daily. 60 tablet 11   esomeprazole (NEXIUM) 40 MG capsule Take 1 capsule (40 mg total) by mouth daily. 90 capsule 2   iron polysaccharides (NIFEREX) 150 MG capsule Take 1 capsule (150 mg total) by mouth daily. 30 capsule 3   lidocaine-prilocaine (EMLA) cream Apply 1 application topically as needed. 30 g 0   sildenafil (VIAGRA) 50 MG tablet Take 1 tablet (50 mg total) by mouth daily as needed for erectile dysfunction. Take 30 mins to 4 hours prior to sexual activity 20 tablet 1   No current facility-administered medications for this visit.   REVIEW OF SYSTEMS:   .10 Point review of Systems was done is negative except as noted above. PHYSICAL EXAMINATION: ECOG FS:1 - Symptomatic but completely ambulatory  Vitals:   03/25/21 1015  BP: 114/68  Pulse: 79  Resp: 20  Temp: 97.7 F (36.5 C)  SpO2: 100%    Wt Readings from Last 3 Encounters:  03/27/21 133 lb (60.3 kg)  03/25/21 133 lb 8 oz (60.6 kg)  03/04/21 135 lb 3.2 oz (61.3 kg)   Body mass index is 19.16 kg/m.    .. GENERAL:alert, in no acute distress and comfortable SKIN: no acute rashes, no significant lesions EYES: conjunctiva are pink  and non-injected, sclera anicteric OROPHARYNX: MMM, no exudates, no oropharyngeal erythema or ulceration NECK: supple, no JVD LYMPH:  no palpable lymphadenopathy in the cervical, axillary or inguinal regions LUNGS: clear to auscultation b/l with normal respiratory effort HEART: regular rate & rhythm ABDOMEN:  normoactive bowel sounds , non tender, not distended. Extremity: no pedal edema PSYCH: alert & oriented x 3 with fluent speech NEURO: no focal motor/sensory deficits    LABORATORY DATA:  I  have reviewed the data as listed  . CBC Latest Ref Rng & Units 03/25/2021 03/04/2021 02/11/2021  WBC 4.0 - 10.5 K/uL 5.6 5.9 4.7  Hemoglobin 13.0 - 17.0 g/dL 10.3(L) 10.2(L) 9.6(L)  Hematocrit 39.0 - 52.0 % 33.9(L) 33.0(L) 32.3(L)  Platelets 150 - 400 K/uL 475(H) 296 289    . CMP Latest Ref Rng & Units 03/25/2021 03/04/2021 02/11/2021  Glucose 70 - 99 mg/dL 94 94 138(H)  BUN 6 - 20 mg/dL $Remove'9 9 9  'JNmVyPA$ Creatinine 0.61 - 1.24 mg/dL 0.85 0.83 1.03  Sodium 135 - 145 mmol/L 140 138 139  Potassium 3.5 - 5.1 mmol/L 4.2 4.2 3.8  Chloride 98 - 111 mmol/L 106 103 106  CO2 22 - 32 mmol/L $RemoveB'27 25 25  'RetjyRcn$ Calcium 8.9 - 10.3 mg/dL 9.4 9.7 9.1  Total Protein 6.5 - 8.1 g/dL 7.4 7.2 6.7  Total Bilirubin 0.3 - 1.2 mg/dL <0.2(L) 0.3 0.2(L)  Alkaline Phos 38 - 126 U/L 93 86 82  AST 15 - 41 U/L 14(L) 16 14(L)  ALT 0 - 44 U/L $Remo'10 10 8    'yxGOm$ 01/22/2019 [facility]: Tumor Mutational Burden     01/22/2019 PD-L1 Immunohistochemistry Analysis    01/22/2019 Soft Tissue Needle Core Biopsy Surgical Pathology     RADIOGRAPHIC STUDIES: I have personally reviewed the radiological images as listed and agreed with the findings in the report. CT Chest W Contrast  Result Date: 03/18/2021 CLINICAL DATA:  Metastatic lung cancer, right shoulder and scapular pain, concern for bony metastases EXAM: CT CHEST WITH CONTRAST TECHNIQUE: Multidetector CT imaging of the chest was performed during intravenous contrast administration.  CONTRAST:  73mL ISOVUE-300 IOPAMIDOL (ISOVUE-300) INJECTION 61% COMPARISON:  CT chest abdomen pelvis, 11/28/2020 FINDINGS: Cardiovascular: Left chest port catheter. Normal heart size. No pericardial effusion. Mediastinum/Nodes: Unchanged post treatment appearance of matted pretracheal, subcarinal, and right hilar lymph nodes and soft tissue, largest pretracheal node measuring approximately 1.2 x 0.7 cm although difficult to discretely appreciate (series 2, image 62). Diffuse thickening of the esophagus. Thyroid gland and trachea demonstrate no significant findings. Lungs/Pleura: Mild-to-moderate centrilobular and paraseptal emphysema. Unchanged post treatment appearance of the chest, with right greater than left suprahilar and paramedian fibrosis and consolidation. No pleural effusion or pneumothorax. Upper Abdomen: No acute abnormality. Interval enlargement of low-attenuation right adrenal nodules, measuring 1.8 x 1.2 cm, previously no larger than 1.0 cm (series 2, image 156) and 1.3 x 0.9 cm, previously no larger than 0.9 x 0.5 cm (series 2, image 167). 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 Musculoskeletal: No chest wall mass or suspicious bone lesions identified. No CT abnormality of the included right scapula or proximal right humerus per ordering indication. IMPRESSION: 1. Unchanged post treatment appearance of the chest, with right greater than left suprahilar and paramedian fibrosis and consolidation. 2. Unchanged post treatment appearance of matted pretracheal, subcarinal, and right hilar lymph nodes and soft tissue. 3. Interval enlargement of low-attenuation right adrenal nodules, worrisome for adrenal metastatic disease. These could be characterized for metabolic activity by PET-CT if desired. 4. No CT abnormality of the included right scapula or proximal right humerus to explain pain per ordering indication. 5. Diffuse thickening of the  esophagus, which may be related to reflux esophagitis or prior radiation therapy. 6. Emphysema. These results will be called to the ordering clinician or representative by the Radiologist Assistant, and communication documented in the PACS or [employer]. Emphysema (ICD10-J43.9). Electronically Signed   By: Cristie Hem  Cherly Beach M.D.   On: 03/18/2021 16:03    ASSESSMENT & PLAN:   This is a 55 year old male with   1. Metastatic Poorly differentiated lung adenocarcinoma Presented with Large neck mass, mediastinal mass, renal mass, and questionable bone lesions  no brain mets on MRI brain 01/22/2019 PD-L1 Immunohistochemistry Analysis which revealed "Tumor Proportion Score (TPS) 50%" 05/16/2019 PET/CT ([id number]) revealed "Radiation changes in the right hemithorax, as above. Improving mediastinal nodal metastases. Prior bulky right cervical metastases have resolved. No evidence of metastatic disease in the abdomen/pelvis." 07/19/2019 Esophagus Scan ([id number]) revealed "Mild stricture at the C6-7 level likely related to prior esophageal surgery. Barium tablet was slow to pass through this area but did pass through after 1 minutes. Hiatal hernia with moderate gastroesophageal reflux. Moderate stricture above the hiatal hernia likely due to reflux. Barium tablet did not pass this area. There are changes of esophagitis which are likely due to reflux and possibly radiation as well."  10/10/19 of  CT Chest W Contrast ([id number])- no evidence of lung cancer progression at this time.   2. 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 Symptomatic hemorrhoids-currently no significant bleeding but have caused some mild iron deficiency anemia.  5. Normocytic anemia -Likely due to underlying malignancy   6. S/pThrombocytosis -- likely due to paraneoplastic effect of tumor  and reactive due to inflammation and tissue inflammation from RT.-- now resolved.   7. Nicotine dependence -He was counseled about tobacco cessation.  -has quit since cancer diagnosis.  8. Iron deficiency - likely due to blood loss from hemorrhoids PLAN:  - The pt reports he had a follow-up with his primary care physician David Mussel MD and reported right scapular pain for which she had a CT of the chest with contrast on 03/17/2021 which showed 1. Unchanged post treatment appearance of the chest, with right greater than left suprahilar and paramedian fibrosis and consolidation. 2. Unchanged post treatment appearance of matted pretracheal, subcarinal, and right hilar lymph nodes and soft tissue. 3. Interval enlargement of low-attenuation right adrenal nodules, worrisome for adrenal metastatic disease. These could be characterized for metabolic activity by PET-CT if desired. 4. No CT abnormality of the included right scapula or proximal right humerus to explain pain per ordering indication. 5. Diffuse thickening of the esophagus, which may be related to reflux esophagitis or prior radiation therapy. 6. Emphysema.  We discussed and patient is agreeable to to get a PET CT scan which has been ordered. Patient notes no new abdominal pain.  No new shortness of breath.  No chest pain. He notes good p.o. intake.  Lab results today 03/25/2021 CBC shows hemoglobin of 10.3 with an MCV of 76.5 WBC count of 5.6k platelets of 475 k CMP normal limits.  -Recommended pt drink 48-64 oz of water daily and given his low blood pressure to increase his p.o. salt intake. -Given a prescription for Ensure to try to increase his caloric intake -The pt has no prohibitive toxicities from continuing Atezolizumab at this time.  -PO iron polysaccharide 150mg  po daily. -Given history of anemia despite oral iron we will set him up for IV Venofer 200 mg every 3 weeks x 3 doses - orders placed  9. Erectile dysfunction  likely due to previous history of smoking and cancer related  Pains testosterone level adequate. Patient has no cardiac limitations that  are known at this time. Plan -Given referral to urology for evaluation of erectile dysfunction. -Viagra increased the dose to 50 mg- continue as needed.  10. Rt hand numbness ? Brachial plexopathy from previous tumor -will refer to Dr Mickeal Skinner for input.   FOLLOW UP: note: PET/CT in 10 days Please schedule next 2 cycles of Teqcentric with labs MD visits with next cycle of treatment in 3 weeks to review PET scan. We will see patient transfusion. Referral to Dr. Mickeal Skinner for evaluation of right hand numbness.     . The total time spent in the appointment was 30 minutes and more than 50% was on counseling and direct patient cares.   All of the patient's questions were answered with apparent satisfaction. The patient knows to call the clinic with any problems, questions or concerns.   Sullivan Lone MD [place] [facility] Hematology/Oncology Physician [facility]

## 2021-04-07 ENCOUNTER — Telehealth: Payer: Self-pay | Admitting: Internal Medicine

## 2021-04-07 NOTE — Telephone Encounter (Signed)
Scheduled appt per 11/28 referral. Pt is aware of appt date and time.

## 2021-04-13 ENCOUNTER — Ambulatory Visit (HOSPITAL_COMMUNITY)
Admission: RE | Admit: 2021-04-13 | Discharge: 2021-04-13 | Disposition: A | Discharge status: Home / Self Care | Payer: Medicaid Other | Source: Ambulatory Visit | Attending: Hematology | Admitting: Hematology

## 2021-04-13 DIAGNOSIS — C3491 Malignant neoplasm of unspecified part of right bronchus or lung: Secondary | ICD-10-CM | POA: Diagnosis not present

## 2021-04-13 DIAGNOSIS — I700 Atherosclerosis of aorta: Secondary | ICD-10-CM | POA: Diagnosis not present

## 2021-04-13 DIAGNOSIS — J432 Centrilobular emphysema: Secondary | ICD-10-CM | POA: Diagnosis not present

## 2021-04-13 DIAGNOSIS — C3490 Malignant neoplasm of unspecified part of unspecified bronchus or lung: Secondary | ICD-10-CM | POA: Diagnosis not present

## 2021-04-13 DIAGNOSIS — R590 Localized enlarged lymph nodes: Secondary | ICD-10-CM | POA: Diagnosis not present

## 2021-04-13 LAB — GLUCOSE, CAPILLARY: Glucose-Capillary: 99 mg/dL (ref 70–99)

## 2021-04-13 IMAGING — CT NM PET TUM IMG RESTAG (PS) SKULL BASE T - THIGH
8 series · 21 of 25 positions shown · non-contrast
Comparison: [DATE]

CLINICAL DATA: Subsequent treatment strategy for Lung cancer.

EXAM:
NUCLEAR MEDICINE PET SKULL BASE TO THIGH
TECHNIQUE: 6.5 mCi F-18 FDG was injected intravenously. Full-ring PET imaging
was performed from the skull base to thigh after the radiotracer. CT
data was obtained and used for attenuation correction and anatomic
localization.
Fasting blood glucose: 99 mg/dl

[Series 3: pet sk_thigh ac · axial · 5.0mm · 4.07mm/px · z∈[-1511,-571]mm · 4 of 236 slices shown]
[im 1/236]
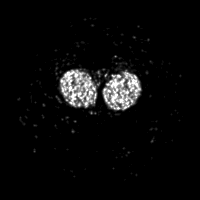
[im 59/236]
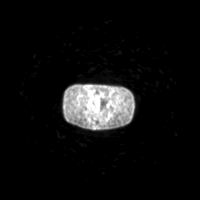
[im 118/236]
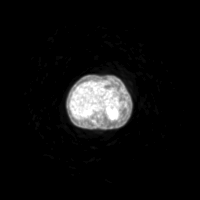
[im 236/236]
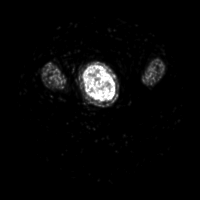

[Series 4: ct sk_thigh 5.0 bf37 · axial · 5.0mm · 0.98mm/px · z∈[-1511,-807]mm · 4 of 236 slices shown]
[im 1/236]
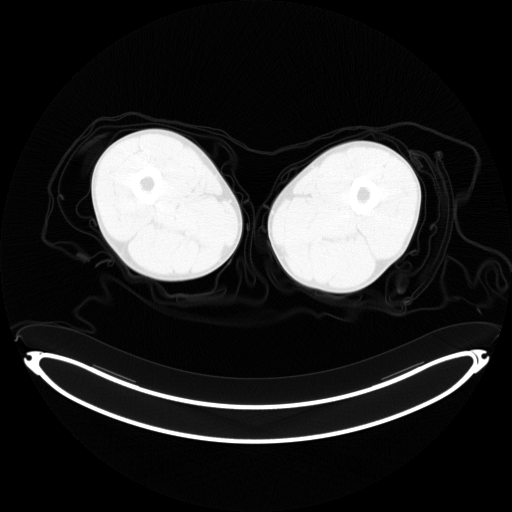
[im 59/236]
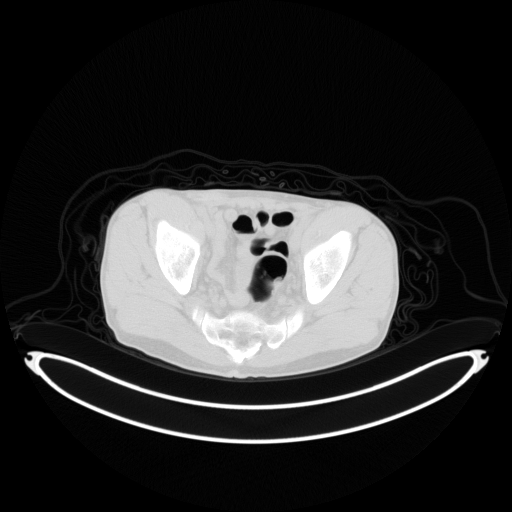
[im 118/236]
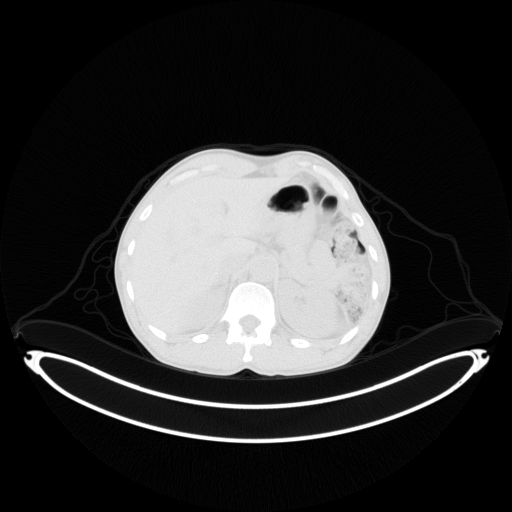
[im 177/236]
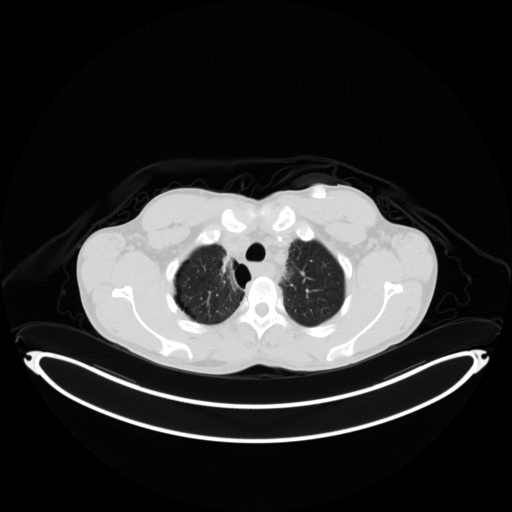

[Series 5: pet sk_thigh nac · axial · 5.0mm · 4.07mm/px · z∈[-1511,-571]mm · 5 of 236 slices shown]
[im 1/236]
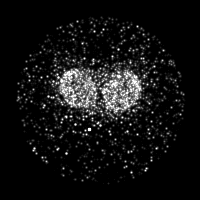
[im 59/236]
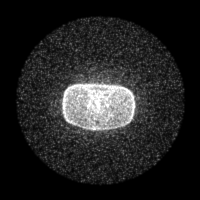
[im 118/236]
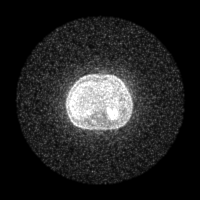
[im 177/236]
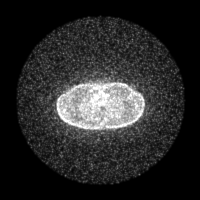
[im 236/236]
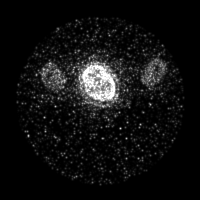

[Series 8: ct sk_thigh 5.0 br59 lung_bone · axial · 5.0mm · 0.85mm/px · 1 of 76 slices shown]
[im 76/76]
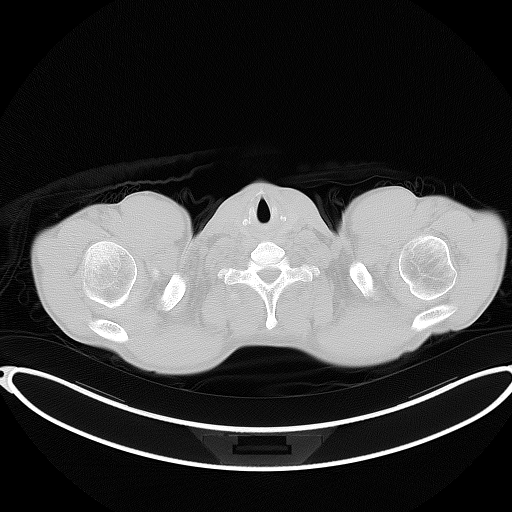

[Series 603: <mip collection> · coronal · 1.95mm/px · 1 of 32 slices shown]
[im 1/32]
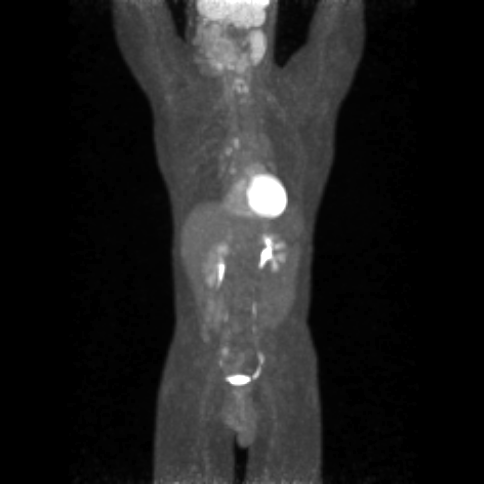

[Series 604: fused cor · 1 of 27 slices shown]
[im 1/27]
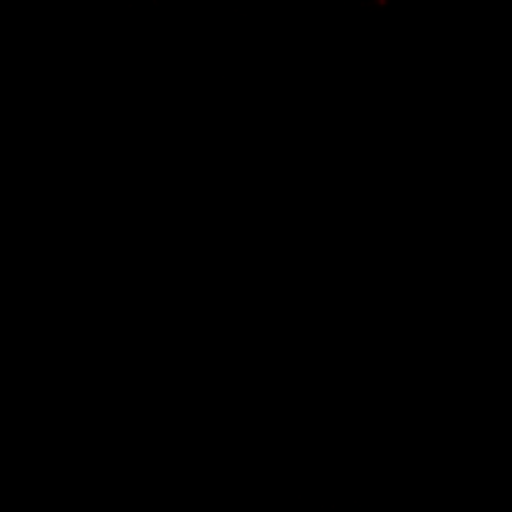

[Series 605: range-ct sk_thigh 5.0 bf37-tra-<alpha range> · 4 of 230 slices shown]
[im 1/230]
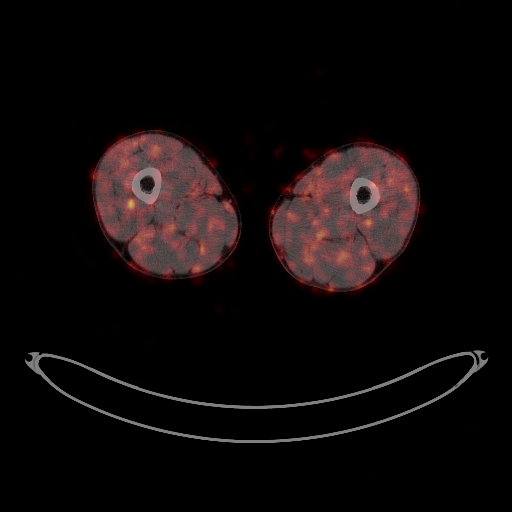
[im 58/230]
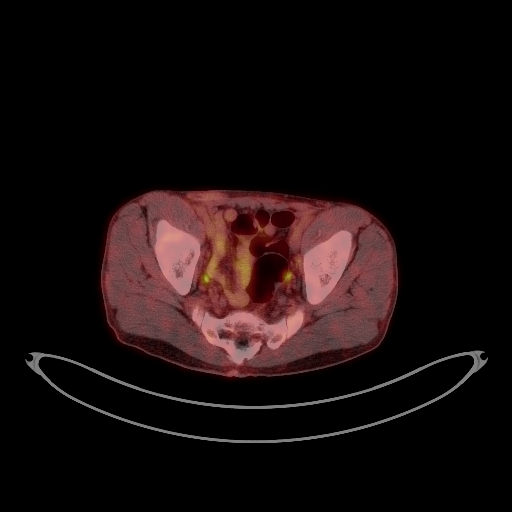
[im 172/230]
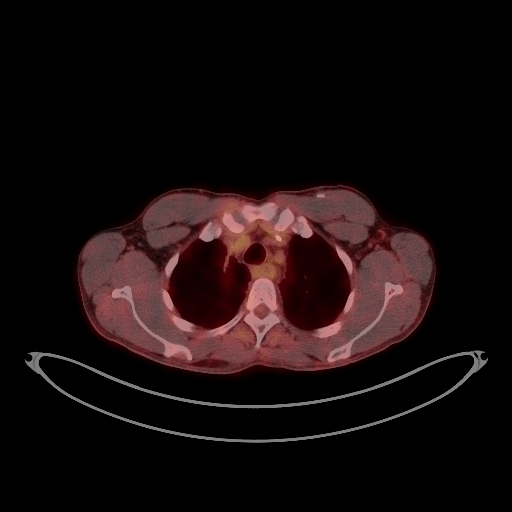
[im 230/230]
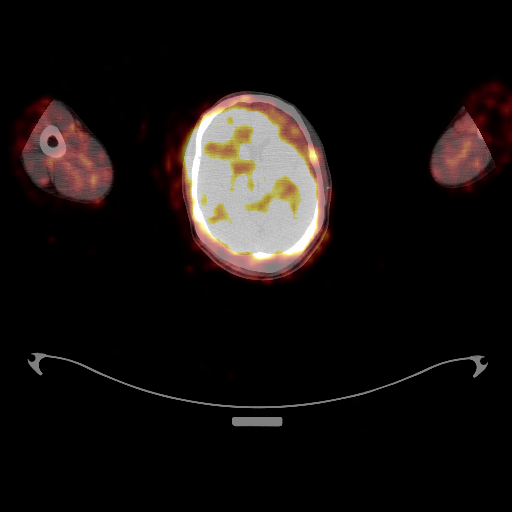

[Series 1140: results mm oncology reading · 5.0mm · 0.45mm/px · 1 of 7 slices shown]
[im 1/7]
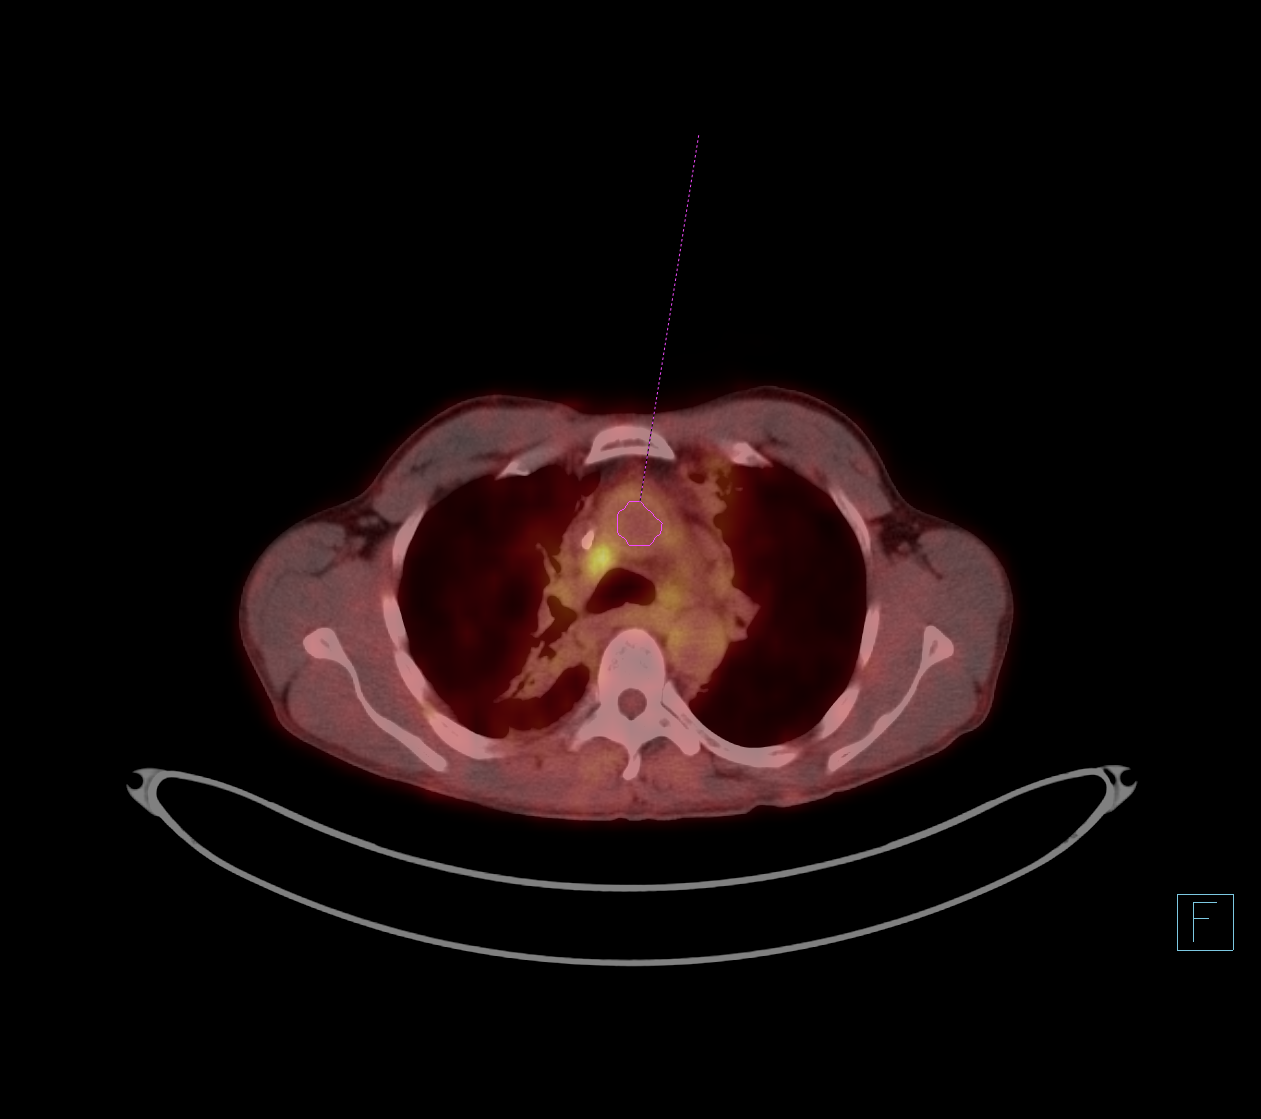

[21 of 25 positions shown; findings below may reference images not displayed]

FINDINGS: Mediastinal blood pool activity: SUV max

Liver activity: SUV max NA

NECK: There is significant misregistration artifact at the level of
the neck resulting from patient motion.

Incidental CT findings: none

CHEST: Tracer avid right paratracheal lymph node has an SUV max of
5.19 and measures approximately 1.2 cm, image 71/4. Asymmetric
increased uptake within the right hilar region has an SUV max of
4.32, image 74/4. FDG avid azygoesophageal lymph node has an SUV max
of 4.37 and measures 0.9 cm, image 83/4. No FDG avid axillary or
supraclavicular lymph nodes.

Bilateral paramediastinal fibrosis is identified compatible with
changes secondary to external beam radiation. No suspicious in FDG
avid pulmonary nodule or mass noted.

Incidental CT findings: Mild changes of paraseptal and centrilobular
emphysema. Aortic atherosclerosis. Increased radiotracer uptake
localizing to the esophagus just above the GE junction has an SUV
max of 4.74. Favor inflammation due to esophagitis.

ABDOMEN/PELVIS: There is asymmetric increased radiotracer uptake
identified within the right adrenal gland with SUV max of 3.39. This
is new when compared with the previous exam. No abnormal uptake
within the left adrenal gland. No abnormal FDG uptake within the
liver, pancreas, spleen. No FDG avid abdominopelvic lymph nodes.
Heterogeneous increased uptake within the prostate gland has an SUV
max of 3.72.

Incidental CT findings: Aortic atherosclerosis. Right kidney
angiomyolipoma identified as noted previously, image 125/4. This
measures approximately 1.2 cm.

SKELETON: No focal hypermetabolic activity to suggest skeletal
metastasis.

Incidental CT findings: none
IMPRESSION: 1. FDG avid right paratracheal, right hilar and azygoesophageal
lymph nodes are identified suspicious for recurrent nodal
metastasis.
2. Mild asymmetric increased radiotracer uptake localizes to the
right adrenal gland where there has been interval enlargement
low-attenuation right adrenal nodules suggestive of metastatic
disease.
3. Increased radiotracer uptake within the distal esophagus which is
concerning for esophagitis.
4. Aortic Atherosclerosis ([VD]-[VD]) and Emphysema ([VD]-[VD]).

## 2021-04-13 MED ORDER — FLUDEOXYGLUCOSE F - 18 (FDG) INJECTION
12.7000 | Freq: Once | INTRAVENOUS | Status: AC
  Administered 2021-04-13: 6.53 via INTRAVENOUS

## 2021-04-15 ENCOUNTER — Inpatient Hospital Stay: Payer: Medicaid Other | Attending: Physician Assistant

## 2021-04-15 ENCOUNTER — Ambulatory Visit: Payer: Medicaid Other

## 2021-04-15 ENCOUNTER — Other Ambulatory Visit: Payer: Self-pay

## 2021-04-15 ENCOUNTER — Inpatient Hospital Stay: Payer: Medicaid Other

## 2021-04-15 VITALS — BP 107/83 | HR 79 | Temp 98.7°F | Resp 18 | Wt 136.8 lb

## 2021-04-15 DIAGNOSIS — Z5112 Encounter for antineoplastic immunotherapy: Secondary | ICD-10-CM | POA: Insufficient documentation

## 2021-04-15 DIAGNOSIS — N529 Male erectile dysfunction, unspecified: Secondary | ICD-10-CM | POA: Insufficient documentation

## 2021-04-15 DIAGNOSIS — Z8042 Family history of malignant neoplasm of prostate: Secondary | ICD-10-CM | POA: Diagnosis not present

## 2021-04-15 DIAGNOSIS — Z7901 Long term (current) use of anticoagulants: Secondary | ICD-10-CM | POA: Diagnosis not present

## 2021-04-15 DIAGNOSIS — Z79899 Other long term (current) drug therapy: Secondary | ICD-10-CM | POA: Diagnosis not present

## 2021-04-15 DIAGNOSIS — C3491 Malignant neoplasm of unspecified part of right bronchus or lung: Secondary | ICD-10-CM | POA: Insufficient documentation

## 2021-04-15 DIAGNOSIS — Z87891 Personal history of nicotine dependence: Secondary | ICD-10-CM | POA: Insufficient documentation

## 2021-04-15 DIAGNOSIS — G252 Other specified forms of tremor: Secondary | ICD-10-CM | POA: Diagnosis not present

## 2021-04-15 DIAGNOSIS — Z95828 Presence of other vascular implants and grafts: Secondary | ICD-10-CM

## 2021-04-15 DIAGNOSIS — Z7189 Other specified counseling: Secondary | ICD-10-CM

## 2021-04-15 DIAGNOSIS — D508 Other iron deficiency anemias: Secondary | ICD-10-CM | POA: Diagnosis not present

## 2021-04-15 LAB — CMP (CANCER CENTER ONLY)
ALT: 14 U/L (ref 0–44)
AST: 13 U/L — ABNORMAL LOW (ref 15–41)
Albumin: 3.8 g/dL (ref 3.5–5.0)
Alkaline Phosphatase: 88 U/L (ref 38–126)
Anion gap: 7 (ref 5–15)
BUN: 13 mg/dL (ref 6–20)
CO2: 25 mmol/L (ref 22–32)
Calcium: 8.8 mg/dL — ABNORMAL LOW (ref 8.9–10.3)
Chloride: 108 mmol/L (ref 98–111)
Creatinine: 1.04 mg/dL (ref 0.61–1.24)
GFR, Estimated: >60 mL/min (ref >60)
Glucose, Bld: 107 mg/dL — ABNORMAL HIGH (ref 70–99)
Potassium: 4.1 mmol/L (ref 3.5–5.1)
Sodium: 140 mmol/L (ref 135–145)
Total Bilirubin: 0.3 mg/dL (ref 0.3–1.2)
Total Protein: 6.8 g/dL (ref 6.5–8.1)

## 2021-04-15 LAB — CBC WITH DIFFERENTIAL/PLATELET
Abs Immature Granulocytes: 0.01 10*3/uL (ref 0.00–0.07)
Basophils Absolute: 0 10*3/uL (ref 0.0–0.1)
Basophils Relative: 1 %
Eosinophils Absolute: 0 10*3/uL (ref 0.0–0.5)
Eosinophils Relative: 1 %
HCT: 33.8 % — ABNORMAL LOW (ref 39.0–52.0)
Hemoglobin: 10.4 g/dL — ABNORMAL LOW (ref 13.0–17.0)
Immature Granulocytes: 0 %
Lymphocytes Relative: 23 %
Lymphs Abs: 1 10*3/uL (ref 0.7–4.0)
MCH: 24 pg — ABNORMAL LOW (ref 26.0–34.0)
MCHC: 30.8 g/dL (ref 30.0–36.0)
MCV: 77.9 fL — ABNORMAL LOW (ref 80.0–100.0)
Monocytes Absolute: 0.6 10*3/uL (ref 0.1–1.0)
Monocytes Relative: 15 %
Neutro Abs: 2.5 10*3/uL (ref 1.7–7.7)
Neutrophils Relative %: 60 %
Platelets: 211 10*3/uL (ref 150–400)
RBC: 4.34 MIL/uL (ref 4.22–5.81)
RDW: 17.6 % — ABNORMAL HIGH (ref 11.5–15.5)
WBC: 4.1 10*3/uL (ref 4.0–10.5)
nRBC: 0 % (ref 0.0–0.2)

## 2021-04-15 MED ORDER — ACETAMINOPHEN 325 MG PO TABS
650.0000 mg | ORAL_TABLET | Freq: Once | ORAL | Status: AC
  Administered 2021-04-15: 650 mg via ORAL
  Filled 2021-04-15: qty 2

## 2021-04-15 MED ORDER — DIPHENHYDRAMINE HCL 25 MG PO CAPS
25.0000 mg | ORAL_CAPSULE | Freq: Once | ORAL | Status: AC
  Administered 2021-04-15: 25 mg via ORAL
  Filled 2021-04-15: qty 1

## 2021-04-15 MED ORDER — LORATADINE 10 MG PO TABS: 10.0000 mg | ORAL_TABLET | Freq: Every day | ORAL | Status: DC

## 2021-04-15 MED ORDER — HEPARIN SOD (PORK) LOCK FLUSH 100 UNIT/ML IV SOLN
500.0000 [IU] | Freq: Once | INTRAVENOUS | Status: AC | PRN
  Administered 2021-04-15: 500 [IU]

## 2021-04-15 MED ORDER — SODIUM CHLORIDE 0.9 % IV SOLN: Freq: Once | INTRAVENOUS | Status: AC

## 2021-04-15 MED ORDER — SODIUM CHLORIDE 0.9 % IV SOLN: Freq: Once | INTRAVENOUS | Status: DC

## 2021-04-15 MED ORDER — FAMOTIDINE 20 MG PO TABS
20.0000 mg | ORAL_TABLET | Freq: Once | ORAL | Status: AC
  Administered 2021-04-15: 20 mg via ORAL
  Filled 2021-04-15: qty 1

## 2021-04-15 MED ORDER — SODIUM CHLORIDE 0.9% FLUSH
10.0000 mL | INTRAVENOUS | Status: DC | PRN
  Administered 2021-04-15: 10 mL

## 2021-04-15 MED ORDER — SODIUM CHLORIDE 0.9 % IV SOLN
1200.0000 mg | Freq: Once | INTRAVENOUS | Status: AC
  Administered 2021-04-15: 1200 mg via INTRAVENOUS
  Filled 2021-04-15: qty 20

## 2021-04-15 MED ORDER — SODIUM CHLORIDE 0.9 % IV SOLN
200.0000 mg | Freq: Once | INTRAVENOUS | Status: AC
  Administered 2021-04-15: 200 mg via INTRAVENOUS
  Filled 2021-04-15: qty 200

## 2021-04-16 ENCOUNTER — Encounter: Payer: Self-pay | Admitting: Hematology

## 2021-04-21 ENCOUNTER — Inpatient Hospital Stay (HOSPITAL_BASED_OUTPATIENT_CLINIC_OR_DEPARTMENT_OTHER): Payer: Medicaid Other | Admitting: Internal Medicine

## 2021-04-21 ENCOUNTER — Other Ambulatory Visit: Payer: Self-pay

## 2021-04-21 DIAGNOSIS — G589 Mononeuropathy, unspecified: Secondary | ICD-10-CM | POA: Diagnosis not present

## 2021-04-21 DIAGNOSIS — G252 Other specified forms of tremor: Secondary | ICD-10-CM

## 2021-04-21 DIAGNOSIS — Z5112 Encounter for antineoplastic immunotherapy: Secondary | ICD-10-CM | POA: Diagnosis not present

## 2021-04-21 MED ORDER — PREGABALIN 50 MG PO CAPS: 50.0000 mg | ORAL_CAPSULE | Freq: Two times a day (BID) | ORAL | 3 refills | Status: DC

## 2021-04-21 MED ORDER — PROPRANOLOL HCL 20 MG PO TABS: 20.0000 mg | ORAL_TABLET | Freq: Two times a day (BID) | ORAL | 3 refills | Status: DC

## 2021-04-21 NOTE — Progress Notes (Signed)
[facility] [place], [place] [address] [phone]   New Patient Evaluation  Date of Service: 04/21/21 Patient Name: David Berry. Patient MRN: [id number] Patient DOB: 05-Jun-1965 Provider: Ventura Sellers, MD  Identifying Statement:  David Berry. is a 55 y.o. male with Intention tremor  Mononeuropathy who presents for initial consultation and evaluation regarding cancer associated neurologic deficits.    Referring Provider: [name], MD [address] [place],  [place] [address]  Primary Cancer:  Oncologic History: Oncology History  Adenocarcinoma of right lung ([facility])  02/05/2019 Initial Diagnosis   Adenocarcinoma of right lung ([facility])   02/12/2019 - 02/26/2019 Chemotherapy   The patient had dexamethasone (DECADRON) 4 MG tablet, 8 mg, Oral, Daily, 1 of 1 cycle, Start date: 02/07/2019, End date: 03/12/2019 palonosetron (ALOXI) injection 0.25 mg, 0.25 mg, Intravenous,  Once, 3 of 4 cycles Administration: 0.25 mg (02/12/2019), 0.25 mg (02/19/2019), 0.25 mg (02/26/2019) CARBOplatin (PARAPLATIN) 220 mg in sodium chloride 0.9 % 250 mL chemo infusion, 220 mg (96.3 % of original dose 226.6 mg), Intravenous,  Once, 3 of 4 cycles Dose modification:   (original dose 226.6 mg, Cycle 1) Administration: 220 mg (02/12/2019), 220 mg (02/26/2019) PACLitaxel (TAXOL) 78 mg in sodium chloride 0.9 % 250 mL chemo infusion (</= 80mg /m2), 45 mg/m2 = 78 mg, Intravenous,  Once, 3 of 4 cycles Administration: 78 mg (02/12/2019), 78 mg (02/26/2019)   for chemotherapy treatment.     03/21/2019 -  Chemotherapy   Patient is on Treatment Plan : LUNG Atezolizumab  q21d Maintenance       History of Present Illness: The patient's records from the referring physician were obtained and reviewed and the patient interviewed to confirm this HPI.  David Blakes. Presents today to review neurologic complaints.  He describes 2+  years history of numbness, tingling, pain affecting the two small fingers of his right hand; symptoms also escalate up the arm and run into the shoulder and collarbone region.  Symptoms clearly preceded cancer diagnosis and treatment.  He has been diagnosed and treated differently over the years, both for carpal tunnel and with steroid injection into the shoulder.  In addition, he complains of a tremor, also affecting the right hand.  It is not present at rest, only with activity.  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  It has been stable over the past couple of years, not worsened with radiation and chemo.  Continues on Tecentriq with Dr. Irene Limbo for lung cancer.  Medications: Current Outpatient Medications on File Prior to Visit  Medication Sig Dispense Refill   amitriptyline (ELAVIL) 50 MG tablet Take 1 tablet (50 mg total) by mouth at bedtime. 30 tablet 1   apixaban (ELIQUIS) 5 MG TABS tablet Take 1 tablet (5 mg total) by mouth 2 (two) times daily. 60 tablet 11   esomeprazole (NEXIUM) 40 MG capsule Take 1 capsule (40 mg total) by mouth daily. 90 capsule 2   iron polysaccharides (NIFEREX) 150 MG capsule Take 1 capsule (150 mg total) by mouth daily. 30 capsule 3   lidocaine-prilocaine (EMLA) cream Apply 1 application topically as needed. 30 g 0   sildenafil (VIAGRA) 50 MG tablet Take 1 tablet (50 mg total) by mouth daily as needed for erectile dysfunction. Take 30 mins to 4 hours prior to sexual activity 20 tablet 1   No current facility-administered medications on file prior to visit.  Allergies:  Allergies  Allergen Reactions   Pregabalin Other (See Comments)    Dizziness, crazy dreams   Past Medical History:  Past Medical History:  Diagnosis Date   Anxiety    Bipolar disorder ([facility])    Blood in stool    Cancer ([facility])    Chronic bronchitis with emphysema ([facility])    pt denies this   GERD (gastroesophageal reflux disease)    Lung cancer ([facility]) dx'd 01/2019    Peripheral vascular disease ([facility])    blood clot in neck Sept 12, 2020   Past Surgical History:  Past Surgical History:  Procedure Laterality Date   APPENDECTOMY     teenager   INGUINAL HERNIA REPAIR Right 2016, 2017   [place], 2018 [facility]-removed mesh   IR IMAGING GUIDED PORT INSERTION  04/26/2019   TOOTH EXTRACTION N/A 03/14/2020   Procedure: DENTAL RESTORATION/EXTRACTIONS;  Surgeon: Diona Browner, DDS;  Location: [facility];  Service: Oral Surgery;  Laterality: N/A;   Social History:  Social History   Socioeconomic History   Marital status: Single    Spouse name: Not on file   Number of children: 1   Years of education: GED   Highest education level: Some college, no degree  Occupational History    Comment: fork lift driver  Tobacco Use   Smoking status: Former    Types: Cigars    Quit date: 01/20/2019    Years since quitting: 2.2   Smokeless tobacco: Never   Tobacco comments:    smokes 3 black and mild cigars per day  Substance and Sexual Activity   Alcohol use: Not Currently   Drug use: No   Sexual activity: Yes  Other Topics Concern   Not on file  Social History Narrative   Lives alone   Caffeine- coffee, 20 oz daily, tea occas   Social Determinants of Health   Financial Resource Strain: Low Risk    Difficulty of Paying Living Expenses: Not very hard  Food Insecurity: No Food Insecurity   Worried About Charity fundraiser in the Last Year: Never true   Ran Out of Food in the Last Year: Never true  Transportation Needs: No Transportation Needs   Lack of Transportation (Medical): No   Lack of Transportation (Non-Medical): No  Physical Activity: Sufficiently Active   Days of Exercise per Week: 3 days   Minutes of Exercise per Session: 150+ min  Stress: No Stress Concern Present   Feeling of Stress : Not at all  Social Connections: Socially Isolated   Frequency of Communication with Friends and Family: More than three times a week   Frequency of  Social Gatherings with Friends and Family: Once a week   Attends Religious Services: Never   Marine scientist or Organizations: No   Attends Music therapist: Not on file   Marital Status: Never married  Human resources officer Violence: Not on file   Family History:  Family History  Problem Relation Age of Onset   Prostate cancer Father     Review of Systems: Constitutional: Doesn't report fevers, chills or abnormal weight loss Eyes: Doesn't report blurriness of vision Ears, nose, mouth, throat, and face: Doesn't report sore throat Respiratory: Doesn't report cough, dyspnea or wheezes Cardiovascular: Doesn't report palpitation, chest discomfort  Gastrointestinal:  Doesn't report nausea, constipation, diarrhea GU: Doesn't report incontinence Skin: Doesn't report skin rashes Neurological: Per HPI Musculoskeletal: Doesn't report joint pain Behavioral/Psych: Doesn't report anxiety  Physical Exam: Vitals:  04/21/21 1114  BP: 105/71  Pulse: 72  Resp: 18  Temp: 98 F (36.7 C)  SpO2: 100%   KPS: 80. General: Alert, cooperative, pleasant, in no acute distress Head: Normal EENT: No conjunctival injection or scleral icterus.  Lungs: Resp effort normal Cardiac: Regular rate Abdomen: Non-distended abdomen Skin: No rashes cyanosis or petechiae. Extremities: No clubbing or edema  Neurologic Exam: Mental Status: Awake, alert, attentive to examiner. Oriented to self and environment. Language is fluent with intact comprehension.  Cranial Nerves: Visual acuity is grossly normal. Visual fields are full. Extra-ocular movements intact. No ptosis. Face is symmetric Motor: Tone and bulk are normal. Power is full in both arms and legs. Noted high frequency tremor in right hand when reaching for object. Reflexes are symmetric, no pathologic reflexes present.  Sensory: Impaired to light touch, lateral aspect of right hand and arm.   Gait: Normal.   Labs: I have reviewed the  data as listed    Component Value Date/Time   NA 140 04/15/2021 1132   K 4.1 04/15/2021 1132   CL 108 04/15/2021 1132   CO2 25 04/15/2021 1132   GLUCOSE 107 (H) 04/15/2021 1132   BUN 13 04/15/2021 1132   CREATININE 1.04 04/15/2021 1132   CALCIUM 8.8 (L) 04/15/2021 1132   PROT 6.8 04/15/2021 1132   ALBUMIN 3.8 04/15/2021 1132   AST 13 (L) 04/15/2021 1132   ALT 14 04/15/2021 1132   ALKPHOS 88 04/15/2021 1132   BILITOT 0.3 04/15/2021 1132   GFRNONAA >60 04/15/2021 1132   GFRAA >60 01/30/2020 0907   Lab Results  Component Value Date   WBC 4.1 04/15/2021   NEUTROABS 2.5 04/15/2021   HGB 10.4 (L) 04/15/2021   HCT 33.8 (L) 04/15/2021   MCV 77.9 (L) 04/15/2021   PLT 211 04/15/2021    Assessment/Plan Intention tremor  Mononeuropathy  David Blakes. 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  Etiology is unknown, but symptoms clearly began prior to radiation therapy, so radiation plexopathy is not on differential, nor chemo neuropathy.  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  We reviewed possible causes, workup.  He elected to defer EMG.  For symptoms, recommended trial of Lyrica 50mg  BID which he had taken previously for back pain. Previously had not tolerated gabapentin, Elavil.  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    For intention tremor, ok with trial of Propranolol 20mg  BID.    We spent twenty additional minutes teaching regarding the natural history, biology, and historical experience in the treatment of neurologic complications of cancer.   We appreciate the opportunity to participate in the care of Zur[place] Carreno.[name]  He should return to clinic in ~1 month to evaluate  response to these interventions.  All questions were answered. The patient knows to call the clinic with any problems, questions or concerns. No barriers to learning were detected.  The total time spent in the encounter was 40 minutes and more than 50% was on counseling and review of test results   Ventura Sellers, MD Medical Director of Neuro-Oncology [facility] at [place] 04/21/21 11:04 AM

## 2021-04-22 ENCOUNTER — Telehealth: Payer: Self-pay | Admitting: Internal Medicine

## 2021-04-22 NOTE — Telephone Encounter (Signed)
Scheduled per 12/14 los, pt has been called and confirmed appt

## 2021-05-06 ENCOUNTER — Inpatient Hospital Stay: Payer: Medicaid Other

## 2021-05-06 ENCOUNTER — Inpatient Hospital Stay (HOSPITAL_BASED_OUTPATIENT_CLINIC_OR_DEPARTMENT_OTHER): Payer: Medicaid Other | Admitting: Physician Assistant

## 2021-05-06 ENCOUNTER — Other Ambulatory Visit: Payer: Self-pay

## 2021-05-06 VITALS — BP 105/73 | HR 70 | Temp 98.4°F | Resp 18

## 2021-05-06 VITALS — BP 102/66 | HR 78 | Temp 97.8°F | Resp 17 | Ht 71.5 in | Wt 141.2 lb

## 2021-05-06 DIAGNOSIS — C3491 Malignant neoplasm of unspecified part of right bronchus or lung: Secondary | ICD-10-CM | POA: Diagnosis not present

## 2021-05-06 DIAGNOSIS — Z5112 Encounter for antineoplastic immunotherapy: Secondary | ICD-10-CM | POA: Diagnosis not present

## 2021-05-06 DIAGNOSIS — Z7189 Other specified counseling: Secondary | ICD-10-CM

## 2021-05-06 DIAGNOSIS — Z95828 Presence of other vascular implants and grafts: Secondary | ICD-10-CM

## 2021-05-06 LAB — CMP (CANCER CENTER ONLY)
ALT: 17 U/L (ref 0–44)
AST: 16 U/L (ref 15–41)
Albumin: 4.3 g/dL (ref 3.5–5.0)
Alkaline Phosphatase: 86 U/L (ref 38–126)
Anion gap: 8 (ref 5–15)
BUN: 13 mg/dL (ref 6–20)
CO2: 26 mmol/L (ref 22–32)
Calcium: 9.5 mg/dL (ref 8.9–10.3)
Chloride: 106 mmol/L (ref 98–111)
Creatinine: 0.87 mg/dL (ref 0.61–1.24)
GFR, Estimated: >60 mL/min (ref >60)
Glucose, Bld: 96 mg/dL (ref 70–99)
Potassium: 3.8 mmol/L (ref 3.5–5.1)
Sodium: 140 mmol/L (ref 135–145)
Total Bilirubin: 0.3 mg/dL (ref 0.3–1.2)
Total Protein: 7.3 g/dL (ref 6.5–8.1)

## 2021-05-06 LAB — CBC WITH DIFFERENTIAL/PLATELET
Abs Immature Granulocytes: 0.01 10*3/uL (ref 0.00–0.07)
Basophils Absolute: 0 10*3/uL (ref 0.0–0.1)
Basophils Relative: 1 %
Eosinophils Absolute: 0.1 10*3/uL (ref 0.0–0.5)
Eosinophils Relative: 1 %
HCT: 37 % — ABNORMAL LOW (ref 39.0–52.0)
Hemoglobin: 11.2 g/dL — ABNORMAL LOW (ref 13.0–17.0)
Immature Granulocytes: 0 %
Lymphocytes Relative: 26 %
Lymphs Abs: 1.3 10*3/uL (ref 0.7–4.0)
MCH: 24.2 pg — ABNORMAL LOW (ref 26.0–34.0)
MCHC: 30.3 g/dL (ref 30.0–36.0)
MCV: 80.1 fL (ref 80.0–100.0)
Monocytes Absolute: 0.7 10*3/uL (ref 0.1–1.0)
Monocytes Relative: 14 %
Neutro Abs: 2.9 10*3/uL (ref 1.7–7.7)
Neutrophils Relative %: 58 %
Platelets: 362 10*3/uL (ref 150–400)
RBC: 4.62 MIL/uL (ref 4.22–5.81)
RDW: 18 % — ABNORMAL HIGH (ref 11.5–15.5)
WBC: 4.9 10*3/uL (ref 4.0–10.5)
nRBC: 0 % (ref 0.0–0.2)

## 2021-05-06 MED ORDER — FAMOTIDINE 20 MG PO TABS
20.0000 mg | ORAL_TABLET | Freq: Once | ORAL | Status: AC
  Administered 2021-05-06: 11:00:00 20 mg via ORAL
  Filled 2021-05-06: qty 1

## 2021-05-06 MED ORDER — SODIUM CHLORIDE 0.9% FLUSH
10.0000 mL | INTRAVENOUS | Status: DC | PRN
  Administered 2021-05-06: 13:00:00 10 mL

## 2021-05-06 MED ORDER — ACETAMINOPHEN 325 MG PO TABS: 650.0000 mg | ORAL_TABLET | Freq: Once | ORAL | Status: DC

## 2021-05-06 MED ORDER — SODIUM CHLORIDE 0.9 % IV SOLN
Freq: Once | INTRAVENOUS | Status: AC
Start: 2021-05-06 — End: 2021-05-06

## 2021-05-06 MED ORDER — SODIUM CHLORIDE 0.9 % IV SOLN
1200.0000 mg | Freq: Once | INTRAVENOUS | Status: AC
  Administered 2021-05-06: 13:00:00 1200 mg via INTRAVENOUS
  Filled 2021-05-06: qty 20

## 2021-05-06 MED ORDER — SODIUM CHLORIDE 0.9 % IV SOLN
200.0000 mg | Freq: Once | INTRAVENOUS | Status: AC
  Administered 2021-05-06: 12:00:00 200 mg via INTRAVENOUS
  Filled 2021-05-06: qty 200

## 2021-05-06 MED ORDER — HEPARIN SOD (PORK) LOCK FLUSH 100 UNIT/ML IV SOLN
500.0000 [IU] | Freq: Once | INTRAVENOUS | Status: AC | PRN
  Administered 2021-05-06: 13:00:00 500 [IU]

## 2021-05-06 MED ORDER — SODIUM CHLORIDE 0.9 % IV SOLN: Freq: Once | INTRAVENOUS | Status: DC

## 2021-05-06 MED ORDER — DIPHENHYDRAMINE HCL 25 MG PO CAPS
25.0000 mg | ORAL_CAPSULE | Freq: Once | ORAL | Status: AC
  Administered 2021-05-06: 11:00:00 25 mg via ORAL
  Filled 2021-05-06: qty 1

## 2021-05-06 MED ORDER — LORATADINE 10 MG PO TABS: 10.0000 mg | ORAL_TABLET | Freq: Once | ORAL | Status: DC

## 2021-05-06 MED ORDER — ACETAMINOPHEN 325 MG PO TABS
650.0000 mg | ORAL_TABLET | Freq: Once | ORAL | Status: AC
  Administered 2021-05-06: 11:00:00 650 mg via ORAL
  Filled 2021-05-06: qty 2

## 2021-05-06 MED ORDER — SODIUM CHLORIDE 0.9% FLUSH
10.0000 mL | INTRAVENOUS | Status: DC | PRN
  Administered 2021-05-06: 10:00:00 10 mL

## 2021-05-06 NOTE — Progress Notes (Signed)
HEMATOLOGY/ONCOLOGY CLINIC NOTE  Date of Service: .05/06/2021  Patient Care Team: David Ruddy, MD as PCP - General (Family Medicine)   CHIEF COMPLAINTS/PURPOSE OF CONSULTATION:  Metastatic Poorly differentiated lung adenocarcinoma  CURRENT TREATMENT: Atezolizumab maintenance   INTERVAL HISTORY:  David Berry. is a 55 y.o. male who is here for evaluation and management of metastatic poorly differentiated lung adenocarcinoma. He was last seen by Dr.  Limbo on 03/25/2021. He is here for his next cycle of maintenance Atezolizumab.   He reports that his energy levels and appetite are stable.  He continues to complete his daily activities on his own.  He denies any nausea, vomiting or abdominal pain.  His bowel habits are unchanged and he has a bowel movement every 2 days.  He continues to have occasional episodes of rectal bleeding from hemorrhoids, especially when he strains.  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 His tremors have improved since starting Propranolol 20 mg twice daily. He reports some dizziness when he changes from sitting to standing position. He denies any syncopal episodes. Patient denies any fevers, chills, night sweats, shortness of breath, chest pain or cough. He has no other complaints. Remaining review of systems is negative.   MEDICAL HISTORY:  Past Medical History:  Diagnosis Date   Anxiety    Bipolar disorder ([place])    Blood in stool    Cancer ([place])    Chronic bronchitis with emphysema ([place])    pt denies this   GERD (gastroesophageal reflux disease)    Lung cancer ([place]) dx'd 01/2019   Peripheral vascular disease ([place])    blood clot in neck Sept 12, 2020    SURGICAL HISTORY: Past Surgical History:  Procedure Laterality Date   APPENDECTOMY     teenager   INGUINAL HERNIA REPAIR Right 2016, 2017   [place], 2018 [facility]-removed mesh   IR IMAGING GUIDED PORT  INSERTION  04/26/2019   TOOTH EXTRACTION N/A 03/14/2020   Procedure: DENTAL RESTORATION/EXTRACTIONS;  Surgeon: Diona Browner, DDS;  Location: [facility];  Service: Oral Surgery;  Laterality: N/A;    SOCIAL HISTORY: Social History   Socioeconomic History   Marital status: Single    Spouse name: Not on file   Number of children: 1   Years of education: GED   Highest education level: Some college, no degree  Occupational History    Comment: fork lift driver  Tobacco Use   Smoking status: Former    Types: Cigars    Quit date: 01/20/2019    Years since quitting: 2.2   Smokeless tobacco: Never   Tobacco comments:    smokes 3 black and mild cigars per day  Substance and Sexual Activity   Alcohol use: Not Currently   Drug use: No   Sexual activity: Yes  Other Topics Concern   Not on file  Social History Narrative   Lives alone   Caffeine- coffee, 20 oz daily, tea occas   Social Determinants of Health   Financial Resource Strain: Low Risk    Difficulty of Paying Living Expenses: Not very hard  Food Insecurity: No Food Insecurity   Worried About Charity fundraiser in the Last Year: Never true   Ran Out of Food in the Last Year: Never true  Transportation Needs: No Transportation Needs   Lack of Transportation (Medical): No   Lack of Transportation (Non-Medical): No  Physical Activity: Sufficiently  Active   Days of Exercise per Week: 3 days   Minutes of Exercise per Session: 150+ min  Stress: No Stress Concern Present   Feeling of Stress : Not at all  Social Connections: Socially Isolated   Frequency of Communication with Friends and Family: More than three times a week   Frequency of Social Gatherings with Friends and Family: Once a week   Attends Religious Services: Never   Marine scientist or Organizations: No   Attends Music therapist: Not on file   Marital Status: Never married  Human resources officer Violence: Not on file    FAMILY HISTORY: Family  History  Problem Relation Age of Onset   Prostate cancer Father     ALLERGIES:  is allergic to pregabalin.  MEDICATIONS:  Current Outpatient Medications  Medication Sig Dispense Refill   amitriptyline (ELAVIL) 50 MG tablet Take 1 tablet (50 mg total) by mouth at bedtime. (Patient not taking: Reported on 04/21/2021) 30 tablet 1   apixaban (ELIQUIS) 5 MG TABS tablet Take 1 tablet (5 mg total) by mouth 2 (two) times daily. 60 tablet 11   Cyanocobalamin (VITAMIN B 12 PO) Take 2,500 mcg by mouth daily.     esomeprazole (NEXIUM) 40 MG capsule Take 1 capsule (40 mg total) by mouth daily. 90 capsule 2   lidocaine-prilocaine (EMLA) cream Apply 1 application topically as needed. 30 g 0   pregabalin (LYRICA) 50 MG capsule Take 1 capsule (50 mg total) by mouth 2 (two) times daily. 60 capsule 3   propranolol (INDERAL) 20 MG tablet Take 1 tablet (20 mg total) by mouth 2 (two) times daily. 60 tablet 3   sildenafil (VIAGRA) 50 MG tablet Take 1 tablet (50 mg total) by mouth daily as needed for erectile dysfunction. Take 30 mins to 4 hours prior to sexual activity 20 tablet 1   No current facility-administered medications for this visit.   REVIEW OF SYSTEMS:   .10 Point review of Systems was done is negative except as noted above. PHYSICAL EXAMINATION: ECOG FS:1 - Symptomatic but completely ambulatory  Vitals:   05/06/21 1034  BP: 102/66  Pulse: 78  Resp: 17  Temp: 97.8 F (36.6 C)  SpO2: 100%    Wt Readings from Last 3 Encounters:  05/06/21 141 lb 3.2 oz (64 kg)  04/21/21 139 lb 1.6 oz (63.1 kg)  04/15/21 136 lb 12 oz (62 kg)   Body mass index is 19.42 kg/m.    GENERAL:alert, in no acute distress and comfortable SKIN: no acute rashes, no significant lesions EYES: conjunctiva are pink and non-injected, sclera anicteric OROPHARYNX: MMM, no exudates, no oropharyngeal erythema or ulceration NECK: supple, no JVD LYMPH:  no palpable lymphadenopathy in the cervical, axillary or inguinal  regions LUNGS: clear to auscultation b/l with normal respiratory effort HEART: regular rate & rhythm ABDOMEN:  normoactive bowel sounds , non tender, not distended. Extremity: no pedal edema PSYCH: alert & oriented x 3 with fluent speech NEURO: no focal motor/sensory deficits    LABORATORY DATA:  I have reviewed the data as listed  . CBC Latest Ref Rng & Units 05/06/2021 04/15/2021 03/25/2021  WBC 4.0 - 10.5 K/uL 4.9 4.1 5.6  Hemoglobin 13.0 - 17.0 g/dL 11.2(L) 10.4(L) 10.3(L)  Hematocrit 39.0 - 52.0 % 37.0(L) 33.8(L) 33.9(L)  Platelets 150 - 400 K/uL 362 211 475(H)    . CMP Latest Ref Rng & Units 05/06/2021 04/15/2021 03/25/2021  Glucose 70 - 99 mg/dL 96 107(H) 94  BUN  6 - 20 mg/dL _0 Creatinine 0.61 - 1.24 mg/dL 0.87 1.04 0.85  Sodium 135 - 145 mmol/L 140 140 140  Potassium 3.5 - 5.1 mmol/L 3.8 4.1 4.2  Chloride 98 - 111 mmol/L 106 108 106  CO2 22 - 32 mmol/L _1 Calcium 8.9 - 10.3 mg/dL 9.5 8.8(L) 9.4  Total Protein 6.5 - 8.1 g/dL 7.3 6.8 7.4  Total Bilirubin 0.3 - 1.2 mg/dL 0.3 0.3 <0.2(L)  Alkaline Phos 38 - 126 U/L 86 88 93  AST 15 - 41 U/L 16 13(L) 14(L)  ALT 0 - 44 U/L _2 01/22/2019 [facility]: Tumor Mutational Burden     01/22/2019 PD-L1 Immunohistochemistry Analysis    01/22/2019 Soft Tissue Needle Core Biopsy Surgical Pathology     RADIOGRAPHIC STUDIES: I have personally reviewed the radiological images as listed and agreed with the findings in the report. NM PET Image Restag (PS) Skull Base To Thigh  Result Date: 04/13/2021 CLINICAL DATA:  Subsequent treatment strategy for Lung cancer. EXAM: NUCLEAR MEDICINE PET SKULL BASE TO THIGH TECHNIQUE: 6.5 mCi F-18 FDG was injected intravenously. Full-ring PET imaging was performed from the skull base to thigh after the radiotracer. CT data was obtained and used for attenuation correction and anatomic localization. Fasting blood glucose: 99 mg/dl COMPARISON:  03/17/2021 FINDINGS:  Mediastinal blood pool activity: SUV max 1.78 Liver activity: SUV max NA NECK: There is significant misregistration artifact at the level of the neck resulting from patient motion. Incidental CT findings: none CHEST: Tracer avid right paratracheal lymph node has an SUV max of 5.19 and measures approximately 1.2 cm, image 71/4. Asymmetric increased uptake within the right hilar region has an SUV max of 4.32, image 74/4. FDG avid azygoesophageal lymph node has an SUV max of 4.37 and measures 0.9 cm, image 83/4. No FDG avid axillary or supraclavicular lymph nodes. Bilateral paramediastinal fibrosis is identified compatible with changes secondary to external beam radiation. No suspicious in FDG avid pulmonary nodule or mass noted. Incidental CT findings: Mild changes of paraseptal and centrilobular emphysema. Aortic atherosclerosis. Increased radiotracer uptake localizing to the esophagus just above the GE junction has an SUV max of 4.74. Favor inflammation due to esophagitis. ABDOMEN/PELVIS: There is asymmetric increased radiotracer uptake identified within the right adrenal gland with SUV max of 3.39. This is new when compared with the previous exam. No abnormal uptake within the left adrenal gland. No abnormal FDG uptake within the liver, pancreas, spleen. No FDG avid abdominopelvic lymph nodes. Heterogeneous increased uptake within the prostate gland has an SUV max of 3.72. Incidental CT findings: Aortic atherosclerosis. 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 This measures approximately 1.2 cm. SKELETON: No focal hypermetabolic activity to suggest skeletal metastasis. Incidental CT findings: none IMPRESSION: 1. FDG avid right paratracheal, right hilar and azygoesophageal lymph nodes are identified suspicious for recurrent nodal metastasis. 2. Mild asymmetric increased radiotracer uptake localizes to the right adrenal gland where there has been interval enlargement low-attenuation  right adrenal nodules suggestive of metastatic disease. 3. Increased radiotracer uptake within the distal esophagus which is concerning for esophagitis. 4. Aortic Atherosclerosis (ICD10-I70.0) and Emphysema (ICD10-J43.9). Electronically Signed   By: Kerby Moors M.D.   On: 04/13/2021 16:46    ASSESSMENT & PLAN:   This is a 55 year old male with   1. Metastatic Poorly differentiated lung adenocarcinoma -Presented with Large neck mass, mediastinal mass, renal mass, and questionable bone lesions. No brain mets on baseline  MRI brain -01/22/2019 PD-L1 Immunohistochemistry Analysis which revealed "Tumor Proportion Score (TPS) 50%" -05/16/2019 PET/CT ([id number]) revealed "Radiation changes in the right hemithorax, as above. Improving mediastinal nodal metastases. Prior bulky right cervical metastases have resolved. No evidence of metastatic disease in the abdomen/pelvis." -07/19/2019 Esophagus Scan ([id number]) revealed "Mild stricture at the C6-7 level likely related to prior esophageal surgery. Barium tablet was slow to pass through this area but did pass through after 1 minutes. Hiatal hernia with moderate gastroesophageal reflux. Moderate stricture above the hiatal hernia likely due to reflux. Barium tablet did not pass this area. There are changes of esophagitis which are likely due to reflux and possibly radiation as well."  -10/10/19 of  CT Chest W Contrast ([id number])- no evidence of lung cancer progression at this time. -11/28/2020: CT CAP showed stable disease without any evidence of new or progressive disease.  -03/17/2021: CT CAP showed interval enlargement of right adrenal nodules, worrisome for adrenal metastatic disease.  -04/13/2021: PET scan showed FDG avid right paratracheal, right hilar and azygoesophageal lymph nodes are identified suspicious for recurrent nodal metastasis.Mild asymmetric increased radiotracer uptake localizes to the right adrenal gland where there has been interval  enlargement low-attenuation right adrenal nodules suggestive of metastatic disease.    2. 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 Symptomatic hemorrhoids-currently no significant bleeding but have caused some mild iron deficiency anemia.  5. Normocytic anemia -Likely due to underlying malignancy and iron deficiency.  -Hgb has improved to 11.2 today.    6. S/pThrombocytosis -- likely due to paraneoplastic effect of tumor and reactive due to inflammation and tissue inflammation from RT.-- now resolved.   7. Nicotine dependence -He was counseled about tobacco cessation.  -has quit since cancer diagnosis.  8. Iron deficiency - - likely due to blood loss from hemorrhoids - currently on PO iron supplementation once daily - received IV venofer 200 mg x 3 from 03/25/21-05/06/2021  9. Erectile dysfunction likely due to previous history of smoking and cancer related  Pains testosterone level adequate. Patient has no cardiac limitations that are known at this time. Plan -Given referral to urology for evaluation of erectile dysfunction. -Viagra increased the dose to 50 mg- continue as needed.  10. Rt hand numbness: -Patient was evaluated by Dr. Mickeal Skinner on 04/21/2021. Recommended additional workup including EMG but patient elected to defer further diagnostic testing. -Started Lyrica 50 mg BID with improvement of symptoms.   11. Intention tremor: --Patient was evaluated by Dr. Mickeal Skinner on 04/21/2021. Recommended to start propranolol 20 BID with improvement of symptoms.   PLAN: --Patient presents for Cycle 38 of Atezolizumab.  --Labs from today were reviewed and adequate for treatment today. CBC shows improvement of anemia with Hgb of 11.2. Otherwise remaining CBC and CMP is unremarkable.  --Reviewed PET scans results with the patient but will wait for  Dr.  Limbo to review results before making any treatment changes. Patient will proceed with treatment today as planned.  --He will return in 3 weeks prior to Cycle 39.    All of the patient's questions were answered with apparent satisfaction. The patient knows to call the clinic with any problems, questions or concerns.  I have spent a total of 25 minutes minutes of face-to-face and non-face-to-face time, preparing to see the patient, performing a medically appropriate examination, counseling and educating the patient, documenting clinical information  in the electronic health record, and care coordination.   L[facility] at [place]: [phone]

## 2021-05-10 ENCOUNTER — Encounter: Payer: Self-pay | Admitting: Hematology

## 2021-05-18 ENCOUNTER — Inpatient Hospital Stay: Payer: Medicaid Other | Attending: Physician Assistant | Admitting: Internal Medicine

## 2021-05-18 ENCOUNTER — Other Ambulatory Visit: Payer: Self-pay

## 2021-05-18 VITALS — HR 75 | Temp 97.8°F | Resp 18 | Ht 71.5 in | Wt 142.4 lb

## 2021-05-18 DIAGNOSIS — C3491 Malignant neoplasm of unspecified part of right bronchus or lung: Secondary | ICD-10-CM | POA: Diagnosis present

## 2021-05-18 DIAGNOSIS — G589 Mononeuropathy, unspecified: Secondary | ICD-10-CM | POA: Diagnosis not present

## 2021-05-18 DIAGNOSIS — G252 Other specified forms of tremor: Secondary | ICD-10-CM | POA: Diagnosis not present

## 2021-05-18 DIAGNOSIS — Z86718 Personal history of other venous thrombosis and embolism: Secondary | ICD-10-CM | POA: Insufficient documentation

## 2021-05-18 DIAGNOSIS — Z79899 Other long term (current) drug therapy: Secondary | ICD-10-CM | POA: Insufficient documentation

## 2021-05-18 DIAGNOSIS — Z7901 Long term (current) use of anticoagulants: Secondary | ICD-10-CM | POA: Insufficient documentation

## 2021-05-18 DIAGNOSIS — Z5112 Encounter for antineoplastic immunotherapy: Secondary | ICD-10-CM | POA: Insufficient documentation

## 2021-05-18 DIAGNOSIS — Z87891 Personal history of nicotine dependence: Secondary | ICD-10-CM | POA: Insufficient documentation

## 2021-05-18 MED ORDER — PROPRANOLOL HCL 20 MG PO TABS: 20.0000 mg | ORAL_TABLET | Freq: Two times a day (BID) | ORAL | 5 refills | Status: DC

## 2021-05-18 NOTE — Progress Notes (Signed)
[facility] at [place] [place], [place] [address] [phone]   Interval Evaluation  Date of Service: 05/18/21 Patient Name: David Berry. Patient MRN: [id number] Patient DOB: January 01, 1966 Provider: Ventura Sellers, MD  Identifying Statement:  David Nickson. is a 56 y.o. male with Mononeuropathy  Intention tremor   Primary Cancer:  Oncologic History: Oncology History  Adenocarcinoma of right lung ([place])  02/05/2019 Initial Diagnosis   Adenocarcinoma of right lung ([place])   02/12/2019 - 02/26/2019 Chemotherapy   The patient had dexamethasone (DECADRON) 4 MG tablet, 8 mg, Oral, Daily, 1 of 1 cycle, Start date: 02/07/2019, End date: 03/12/2019 palonosetron (ALOXI) injection 0.25 mg, 0.25 mg, Intravenous,  Once, 3 of 4 cycles Administration: 0.25 mg (02/12/2019), 0.25 mg (02/19/2019), 0.25 mg (02/26/2019) CARBOplatin (PARAPLATIN) 220 mg in sodium chloride 0.9 % 250 mL chemo infusion, 220 mg (96.3 % of original dose 226.6 mg), Intravenous,  Once, 3 of 4 cycles Dose modification:   (original dose 226.6 mg, Cycle 1) Administration: 220 mg (02/12/2019), 220 mg (02/26/2019) PACLitaxel (TAXOL) 78 mg in sodium chloride 0.9 % 250 mL chemo infusion (</= 80mg /m2), 45 mg/m2 = 78 mg, Intravenous,  Once, 3 of 4 cycles Administration: 78 mg (02/12/2019), 78 mg (02/26/2019)   for chemotherapy treatment.     03/21/2019 -  Chemotherapy   Patient is on Treatment Plan : LUNG Atezolizumab  q21d Maintenance       Interval History: David Bondar. Presents today for follow up.  He describes considerable improvement in numbness, tingling and pain in the fingers of his right hand since starting the Lyrica.  No side effects from the treatment.  He also describes considerable (although not complete) improvement in intention tremor affecting the right hand since dosing the propranolol.  He overall feels improved and would like to consider returning to  work part time.   H+P (04/21/21) Presents today to review neurologic complaints.  He describes 2+ years history of numbness, tingling, pain affecting the two small fingers of his right hand; symptoms also escalate up the arm and run into the shoulder and collarbone region.  Symptoms clearly preceded cancer diagnosis and treatment.  He has been diagnosed and treated differently over the years, both for carpal tunnel and with steroid injection into the shoulder.  In addition, he complains of a tremor, also affecting the right hand.  It is not present at rest, only with activity.  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  It has been stable over the past couple of years, not worsened with radiation and chemo.  Continues on Tecentriq with Dr. Irene Limbo for lung cancer.  Medications: Current Outpatient Medications on File Prior to Visit  Medication Sig Dispense Refill   apixaban (ELIQUIS) 5 MG TABS tablet Take 1 tablet (5 mg total) by mouth 2 (two) times daily. 60 tablet 11   Cyanocobalamin (VITAMIN B 12 PO) Take 2,500 mcg by mouth daily.     esomeprazole (NEXIUM) 40 MG capsule Take 1 capsule (40 mg total) by mouth daily. 90 capsule 2   lidocaine-prilocaine (EMLA) cream Apply 1 application topically as needed. 30 g 0   pregabalin (LYRICA) 50 MG capsule Take 1 capsule (50 mg total) by mouth 2 (two) times daily. 60 capsule 3   propranolol (INDERAL) 20 MG tablet Take 1 tablet (20 mg total) by mouth 2 (two) times daily. 60 tablet 3   sildenafil (VIAGRA) 50 MG  tablet Take 1 tablet (50 mg total) by mouth daily as needed for erectile dysfunction. Take 30 mins to 4 hours prior to sexual activity 20 tablet 1   No current facility-administered medications on file prior to visit.    Allergies:  No Active Allergies  Past Medical History:  Past Medical History:  Diagnosis Date   Anxiety    Bipolar disorder ([place])    Blood in stool    Cancer ([place])    Chronic bronchitis with emphysema  ([place])    pt denies this   GERD (gastroesophageal reflux disease)    Lung cancer ([place]) dx'd 01/2019   Peripheral vascular disease ([place])    blood clot in neck Sept 12, 2020   Past Surgical History:  Past Surgical History:  Procedure Laterality Date   APPENDECTOMY     teenager   INGUINAL HERNIA REPAIR Right 2016, 2017   [place], 2018 [facility]-removed mesh   IR IMAGING GUIDED PORT INSERTION  04/26/2019   TOOTH EXTRACTION N/A 03/14/2020   Procedure: DENTAL RESTORATION/EXTRACTIONS;  Surgeon: Diona Browner, DDS;  Location: [facility];  Service: Oral Surgery;  Laterality: N/A;   Social History:  Social History   Socioeconomic History   Marital status: Single    Spouse name: Not on file   Number of children: 1   Years of education: GED   Highest education level: Some college, no degree  Occupational History    Comment: fork lift driver  Tobacco Use   Smoking status: Former    Types: Cigars    Quit date: 01/20/2019    Years since quitting: 2.3   Smokeless tobacco: Never   Tobacco comments:    smokes 3 black and mild cigars per day  Substance and Sexual Activity   Alcohol use: Not Currently   Drug use: No   Sexual activity: Yes  Other Topics Concern   Not on file  Social History Narrative   Lives alone   Caffeine- coffee, 20 oz daily, tea occas   Social Determinants of Health   Financial Resource Strain: Low Risk    Difficulty of Paying Living Expenses: Not very hard  Food Insecurity: No Food Insecurity   Worried About Charity fundraiser in the Last Year: Never true   Ran Out of Food in the Last Year: Never true  Transportation Needs: No Transportation Needs   Lack of Transportation (Medical): No   Lack of Transportation (Non-Medical): No  Physical Activity: Sufficiently Active   Days of Exercise per Week: 3 days   Minutes of Exercise per Session: 150+ min  Stress: No Stress Concern Present   Feeling of Stress : Not at all  Social Connections: Socially Isolated    Frequency of Communication with Friends and Family: More than three times a week   Frequency of Social Gatherings with Friends and Family: Once a week   Attends Religious Services: Never   Marine scientist or Organizations: No   Attends Music therapist: Not on file   Marital Status: Never married  Human resources officer Violence: Not on file   Family History:  Family History  Problem Relation Age of Onset   Prostate cancer Father     Review of Systems: Constitutional: Doesn't report fevers, chills or abnormal weight loss Eyes: Doesn't report blurriness of vision Ears, nose, mouth, throat, and face: Doesn't report sore throat Respiratory: Doesn't report cough, dyspnea or wheezes Cardiovascular: Doesn't report palpitation, chest discomfort  Gastrointestinal:  Doesn't report nausea, constipation,  diarrhea GU: Doesn't report incontinence Skin: Doesn't report skin rashes Neurological: Per HPI Musculoskeletal: Doesn't report joint pain Behavioral/Psych: Doesn't report anxiety  Physical Exam: Vitals:   05/18/21 1042  Pulse: 75  Resp: 18  Temp: 97.8 F (36.6 C)  SpO2: 100%   KPS: 80. General: Alert, cooperative, pleasant, in no acute distress Head: Normal EENT: No conjunctival injection or scleral icterus.  Lungs: Resp effort normal Cardiac: Regular rate Abdomen: Non-distended abdomen Skin: No rashes cyanosis or petechiae. Extremities: No clubbing or edema  Neurologic Exam: Mental Status: Awake, alert, attentive to examiner. Oriented to self and environment. Language is fluent with intact comprehension.  Cranial Nerves: Visual acuity is grossly normal. Visual fields are full. Extra-ocular movements intact. No ptosis. Face is symmetric Motor: Tone and bulk are normal. Power is full in both arms and legs. Noted high frequency tremor in right hand when reaching for object. Reflexes are symmetric, no pathologic reflexes present.  Sensory: Impaired to light  touch, lateral aspect of right hand and arm.   Gait: Normal.   Labs: I have reviewed the data as listed    Component Value Date/Time   NA 140 05/06/2021 1018   K 3.8 05/06/2021 1018   CL 106 05/06/2021 1018   CO2 26 05/06/2021 1018   GLUCOSE 96 05/06/2021 1018   BUN 13 05/06/2021 1018   CREATININE 0.87 05/06/2021 1018   CALCIUM 9.5 05/06/2021 1018   PROT 7.3 05/06/2021 1018   ALBUMIN 4.3 05/06/2021 1018   AST 16 05/06/2021 1018   ALT 17 05/06/2021 1018   ALKPHOS 86 05/06/2021 1018   BILITOT 0.3 05/06/2021 1018   GFRNONAA >60 05/06/2021 1018   GFRAA >60 01/30/2020 0907   Lab Results  Component Value Date   WBC 4.9 05/06/2021   NEUTROABS 2.9 05/06/2021   HGB 11.2 (L) 05/06/2021   HCT 37.0 (L) 05/06/2021   MCV 80.1 05/06/2021   PLT 362 05/06/2021    Assessment/Plan Mononeuropathy  Intention tremor  David Blakes. Is clinically improved today with regards to both ulnar neuropathy and intention tremor.  He may continue Lyrica 50mg  BID as well as Propranolol 20mg  BID.  Tolerating both well.    Will con't to follow with Dr. Julien Nordmann for lung cancer.  We appreciate the opportunity to participate in the care of David Berry.[name]  He should return to clinic as needed or with new/progressive symptoms.  All questions were answered. The patient knows to call the clinic with any problems, questions or concerns. No barriers to learning were detected.  The total time spent in the encounter was 30 minutes and more than 50% was on counseling and review of test results   Ventura Sellers, MD Medical Director of Neuro-Oncology [facility] at [place] 05/18/21 10:55 AM

## 2021-05-19 ENCOUNTER — Ambulatory Visit: Payer: Medicaid Other | Admitting: Internal Medicine

## 2021-05-27 ENCOUNTER — Inpatient Hospital Stay (HOSPITAL_BASED_OUTPATIENT_CLINIC_OR_DEPARTMENT_OTHER): Payer: Medicaid Other | Admitting: Hematology

## 2021-05-27 ENCOUNTER — Other Ambulatory Visit: Payer: Self-pay

## 2021-05-27 ENCOUNTER — Inpatient Hospital Stay: Payer: Medicaid Other

## 2021-05-27 ENCOUNTER — Ambulatory Visit: Payer: Medicaid Other

## 2021-05-27 ENCOUNTER — Other Ambulatory Visit: Payer: Medicaid Other

## 2021-05-27 VITALS — BP 90/68 | HR 64 | Temp 97.9°F | Resp 20 | Wt 138.0 lb

## 2021-05-27 DIAGNOSIS — Z5112 Encounter for antineoplastic immunotherapy: Secondary | ICD-10-CM

## 2021-05-27 DIAGNOSIS — C3491 Malignant neoplasm of unspecified part of right bronchus or lung: Secondary | ICD-10-CM

## 2021-05-27 DIAGNOSIS — Z95828 Presence of other vascular implants and grafts: Secondary | ICD-10-CM

## 2021-05-27 DIAGNOSIS — Z7189 Other specified counseling: Secondary | ICD-10-CM

## 2021-05-27 LAB — CBC WITH DIFFERENTIAL/PLATELET
Abs Immature Granulocytes: 0 10*3/uL (ref 0.00–0.07)
Basophils Absolute: 0 10*3/uL (ref 0.0–0.1)
Basophils Relative: 1 %
Eosinophils Absolute: 0.1 10*3/uL (ref 0.0–0.5)
Eosinophils Relative: 1 %
HCT: 38.2 % — ABNORMAL LOW (ref 39.0–52.0)
Hemoglobin: 11.8 g/dL — ABNORMAL LOW (ref 13.0–17.0)
Immature Granulocytes: 0 %
Lymphocytes Relative: 24 %
Lymphs Abs: 0.9 10*3/uL (ref 0.7–4.0)
MCH: 25 pg — ABNORMAL LOW (ref 26.0–34.0)
MCHC: 30.9 g/dL (ref 30.0–36.0)
MCV: 80.9 fL (ref 80.0–100.0)
Monocytes Absolute: 0.6 10*3/uL (ref 0.1–1.0)
Monocytes Relative: 15 %
Neutro Abs: 2.3 10*3/uL (ref 1.7–7.7)
Neutrophils Relative %: 59 %
Platelets: 246 10*3/uL (ref 150–400)
RBC: 4.72 MIL/uL (ref 4.22–5.81)
RDW: 18.4 % — ABNORMAL HIGH (ref 11.5–15.5)
WBC: 3.9 10*3/uL — ABNORMAL LOW (ref 4.0–10.5)
nRBC: 0 % (ref 0.0–0.2)

## 2021-05-27 LAB — CMP (CANCER CENTER ONLY)
ALT: 10 U/L (ref 0–44)
AST: 14 U/L — ABNORMAL LOW (ref 15–41)
Albumin: 4.2 g/dL (ref 3.5–5.0)
Alkaline Phosphatase: 88 U/L (ref 38–126)
Anion gap: 4 — ABNORMAL LOW (ref 5–15)
BUN: 9 mg/dL (ref 6–20)
CO2: 30 mmol/L (ref 22–32)
Calcium: 9.7 mg/dL (ref 8.9–10.3)
Chloride: 103 mmol/L (ref 98–111)
Creatinine: 0.85 mg/dL (ref 0.61–1.24)
GFR, Estimated: >60 mL/min (ref >60)
Glucose, Bld: 103 mg/dL — ABNORMAL HIGH (ref 70–99)
Potassium: 4.1 mmol/L (ref 3.5–5.1)
Sodium: 137 mmol/L (ref 135–145)
Total Bilirubin: 0.3 mg/dL (ref 0.3–1.2)
Total Protein: 7.1 g/dL (ref 6.5–8.1)

## 2021-05-27 MED ORDER — SODIUM CHLORIDE 0.9% FLUSH
10.0000 mL | INTRAVENOUS | Status: DC | PRN
  Administered 2021-05-27: 10 mL

## 2021-05-27 MED ORDER — DIPHENHYDRAMINE HCL 25 MG PO CAPS
25.0000 mg | ORAL_CAPSULE | Freq: Once | ORAL | Status: AC
  Administered 2021-05-27: 25 mg via ORAL
  Filled 2021-05-27: qty 1

## 2021-05-27 MED ORDER — FAMOTIDINE 20 MG PO TABS
20.0000 mg | ORAL_TABLET | Freq: Once | ORAL | Status: AC
  Administered 2021-05-27: 20 mg via ORAL
  Filled 2021-05-27: qty 1

## 2021-05-27 MED ORDER — SODIUM CHLORIDE 0.9 % IV SOLN
1200.0000 mg | Freq: Once | INTRAVENOUS | Status: AC
  Administered 2021-05-27: 1200 mg via INTRAVENOUS
  Filled 2021-05-27: qty 20

## 2021-05-27 MED ORDER — SODIUM CHLORIDE 0.9 % IV SOLN: Freq: Once | INTRAVENOUS | Status: AC

## 2021-05-27 MED ORDER — HEPARIN SOD (PORK) LOCK FLUSH 100 UNIT/ML IV SOLN
500.0000 [IU] | Freq: Once | INTRAVENOUS | Status: AC | PRN
  Administered 2021-05-27: 500 [IU]

## 2021-05-27 MED ORDER — ACETAMINOPHEN 325 MG PO TABS
650.0000 mg | ORAL_TABLET | Freq: Once | ORAL | Status: AC
  Administered 2021-05-27: 650 mg via ORAL
  Filled 2021-05-27: qty 2

## 2021-05-27 NOTE — Patient Instructions (Signed)
[facility] ONCOLOGY  Discharge Instructions: Thank you for choosing [facility] to provide your oncology and hematology care.   If you have a lab appointment with the [facility], please go directly to the [facility] and check in at the registration area.   Wear comfortable clothing and clothing appropriate for easy access to any Portacath or PICC line.   We strive to give you quality time with your provider. You may need to reschedule your appointment if you arrive late (15 or more minutes).  Arriving late affects you and other patients whose appointments are after yours.  Also, if you miss three or more appointments without notifying the office, you may be dismissed from the clinic at the providers discretion.      For prescription refill requests, have your pharmacy contact our office and allow 72 hours for refills to be completed.    Today you received the following chemotherapy and/or immunotherapy agents atezolizumab      To help prevent nausea and vomiting after your treatment, we encourage you to take your nausea medication as directed.  BELOW ARE SYMPTOMS THAT SHOULD BE REPORTED IMMEDIATELY: *FEVER GREATER THAN 100.4 F (38 C) OR HIGHER *CHILLS OR SWEATING *NAUSEA AND VOMITING THAT IS NOT CONTROLLED WITH YOUR NAUSEA MEDICATION *UNUSUAL SHORTNESS OF BREATH *UNUSUAL BRUISING OR BLEEDING *URINARY PROBLEMS (pain or burning when urinating, or frequent urination) *BOWEL PROBLEMS (unusual diarrhea, constipation, pain near the anus) TENDERNESS IN MOUTH AND THROAT WITH OR WITHOUT PRESENCE OF ULCERS (sore throat, sores in mouth, or a toothache) UNUSUAL RASH, SWELLING OR PAIN  UNUSUAL VAGINAL DISCHARGE OR ITCHING   Items with * indicate a potential emergency and should be followed up as soon as possible or go to the Emergency Department if any problems should occur.  Please show the CHEMOTHERAPY ALERT CARD or IMMUNOTHERAPY ALERT CARD at check-in  to the Emergency Department and triage nurse.  Should you have questions after your visit or need to cancel or reschedule your appointment, please contact [facility]  Dept: [phone]  and follow the prompts.  Office hours are 8:00 a.m. to 4:30 p.m. Monday - Friday. Please note that voicemails left after 4:00 p.m. may not be returned until the following business day.  We are closed weekends and major holidays. You have access to a nurse at all times for urgent questions. Please call the main number to the clinic Dept: [phone] and follow the prompts.   For any non-urgent questions, you may also contact your provider using MyChart. We now offer e-Visits for anyone 4 and older to request care online for non-urgent symptoms. For details visit [email or web address].   Also download the MyChart app! Go to the app store, search "MyChart", open the app, select [facility], and log in with your MyChart username and password.  Due to Covid, a mask is required upon entering the hospital/clinic. If you do not have a mask, one will be given to you upon arrival. For doctor visits, patients may have 1 support person aged 71 or older with them. For treatment visits, patients cannot have anyone with them due to current Covid guidelines and our immunocompromised population.

## 2021-05-28 ENCOUNTER — Telehealth: Payer: Self-pay | Admitting: Hematology

## 2021-05-28 NOTE — Telephone Encounter (Signed)
Scheduled follow-up appointments per 1/18 los. Patient is aware. °

## 2021-06-02 ENCOUNTER — Encounter: Payer: Self-pay | Admitting: Hematology

## 2021-06-02 NOTE — Progress Notes (Addendum)
HEMATOLOGY/ONCOLOGY CLINIC NOTE  Date of Service: .05/27/2021   Patient Care Team: [name], MD as PCP - General (Family Medicine)   CHIEF COMPLAINTS/PURPOSE OF CONSULTATION:  Follow-up for continued management of metastatic poorly differentiated lung carcinoma and PET/CT results.  HISTORY OF PRESENTING ILLNESS:  Please see previous notes for details on initial presentation.  Current Treatment:  Atezolizumab maintenance   INTERVAL HISTORY:   David Berry. is here for continued evaluation and management of his metastatic poorly differentiated lung adenocarcinoma. He was last seen in our clinic by Dede Query PA_C on 05/06/2021.  He notes no acute new symptoms today new chest pain or shortness of breath.  No new upper extremity swelling.  No abdominal or flank pain.  No change in bowel habits.  No new skin rashes. No new toxicities to atezolizumab. He did have a PET CT scan on 04/13/2021 which was reviewed on the previous visit by Murray Hodgkins.  This showed some borderline FDG avid right paratracheal right hilar and azygoesophageal lymph nodes that were suspicious for possible nodal recurrence however the patient notes that he did have a bad upper respiratory tract infection around the time that he had his PET CT scan and therefore reactive adenopathy is a possibility as well.  He also did have some asymmetric uptake in the right adrenal gland.  We discussed the possibility that this might suggest progression.  After detailed discussion there is a shared decision to repeat a PET CT scan at an interval and continue his current immunotherapy that has served him well for a long time.  Labs done today show continued improvement in his hemoglobin up to 11.8 which has responded to his IV iron.  WBC count 3.9k and platelets of 246k. CMP stable.  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[name])    pt denies this   GERD (gastroesophageal reflux disease)    Lung cancer ([place]) dx'd 01/2019   Peripheral vascular disease ([place])    blood clot in neck Sept 12, 2020    SURGICAL HISTORY: Past Surgical History:  Procedure Laterality Date   APPENDECTOMY     teenager   INGUINAL HERNIA REPAIR Right 2016, 2017   [place], 2018 [facility]-removed mesh   IR IMAGING GUIDED PORT INSERTION  04/26/2019   TOOTH EXTRACTION N/A 03/14/2020   Procedure: DENTAL RESTORATION/EXTRACTIONS;  Surgeon: Diona Browner, DDS;  Location: [facility];  Service: Oral Surgery;  Laterality: N/A;    SOCIAL HISTORY: Social History   Socioeconomic History   Marital status: Single    Spouse name: Not on file   Number of children: 1   Years of education: GED   Highest education level: Some college, no degree  Occupational History    Comment: fork lift driver  Tobacco Use   Smoking status: Former    Types: Cigars    Quit date: 01/20/2019    Years since quitting: 2.4   Smokeless tobacco: Never   Tobacco comments:    smokes 3 black and mild cigars per day  Substance and Sexual Activity   Alcohol use: Not Currently   Drug use: No   Sexual activity: Yes  Other Topics Concern   Not on file  Social History Narrative   Lives alone   Caffeine- coffee, 20 oz daily, tea  occas   Social Determinants of Health   Financial Resource Strain: Low Risk    Difficulty of Paying Living Expenses: Not very hard  Food Insecurity: No Food Insecurity   Worried About Charity fundraiser in the Last Year: Never true   Ran Out of Food in the Last Year: Never true  Transportation Needs: No Transportation Needs   Lack of Transportation (Medical): No   Lack of Transportation (Non-Medical): No  Physical Activity: Sufficiently Active   Days of Exercise per Week: 3 days   Minutes of Exercise per Session: 150+ min  Stress: No Stress Concern Present   Feeling of Stress : Not at all   Social Connections: Socially Isolated   Frequency of Communication with Friends and Family: More than three times a week   Frequency of Social Gatherings with Friends and Family: Once a week   Attends Religious Services: Never   Marine scientist or Organizations: No   Attends Music therapist: Not on file   Marital Status: Never married  Human resources officer Violence: Not on file    FAMILY HISTORY: Family History  Problem Relation Age of Onset   Prostate cancer Father     ALLERGIES:  has no active allergies.  MEDICATIONS:  Current Outpatient Medications  Medication Sig Dispense Refill   apixaban (ELIQUIS) 5 MG TABS tablet Take 1 tablet (5 mg total) by mouth 2 (two) times daily. 60 tablet 11   Cyanocobalamin (VITAMIN B 12 PO) Take 2,500 mcg by mouth daily.     esomeprazole (NEXIUM) 40 MG capsule Take 1 capsule (40 mg total) by mouth daily. 90 capsule 2   lidocaine-prilocaine (EMLA) cream Apply 1 application topically as needed. 30 g 0   propranolol (INDERAL) 20 MG tablet Take 1 tablet (20 mg total) by mouth 2 (two) times daily. 60 tablet 5   sildenafil (VIAGRA) 50 MG tablet Take 1 tablet (50 mg total) by mouth daily as needed for erectile dysfunction. Take 30 mins to 4 hours prior to sexual activity 20 tablet 1   pregabalin (LYRICA) 50 MG capsule Take 1 capsule (50 mg total) by mouth 2 (two) times daily. (Patient not taking: Reported on 05/27/2021) 60 capsule 3   No current facility-administered medications for this visit.   REVIEW OF SYSTEMS:   .10 Point review of Systems was done is negative except as noted above.  PHYSICAL EXAMINATION: ECOG FS:1 - Symptomatic but completely ambulatory  Vitals:   05/27/21 0930  BP: 90/68  Pulse: 64  Resp: 20  Temp: 97.9 F (36.6 C)  SpO2: 99%    Wt Readings from Last 3 Encounters:  05/27/21 138 lb (62.6 kg)  05/18/21 142 lb 6.4 oz (64.6 kg)  05/06/21 141 lb 3.2 oz (64 kg)   Body mass index is 18.98 kg/m.    [name] GENERAL:alert, in no acute distress and comfortable SKIN: no acute rashes, no significant lesions EYES: conjunctiva are pink and non-injected, sclera anicteric OROPHARYNX: MMM, no exudates, no oropharyngeal erythema or ulceration NECK: supple, no JVD LYMPH:  no palpable lymphadenopathy in the cervical, axillary or inguinal regions LUNGS: clear to auscultation b/l with normal respiratory effort HEART: regular rate & rhythm ABDOMEN:  normoactive bowel sounds , non tender, not distended. Extremity: no pedal edema PSYCH: alert & oriented x 3 with fluent speech NEURO: no focal motor/sensory deficits   LABORATORY DATA:  I have reviewed the data as listed  . CBC Latest Ref Rng & Units 05/27/2021 05/06/2021 04/15/2021  WBC 4.0 - 10.5 K/uL 3.9(L) 4.9 4.1  Hemoglobin 13.0 - 17.0 g/dL 11.8(L) 11.2(L) 10.4(L)  Hematocrit 39.0 - 52.0 % 38.2(L) 37.0(L) 33.8(L)  Platelets 150 - 400 K/uL 246 362 211    . CMP Latest Ref Rng & Units 05/27/2021 05/06/2021 04/15/2021  Glucose 70 - 99 mg/dL 103(H) 96 107(H)  BUN 6 - 20 mg/dL _0 Creatinine 0.61 - 1.24 mg/dL 0.85 0.87 1.04  Sodium 135 - 145 mmol/L 137 140 140  Potassium 3.5 - 5.1 mmol/L 4.1 3.8 4.1  Chloride 98 - 111 mmol/L 103 106 108  CO2 22 - 32 mmol/L _1 Calcium 8.9 - 10.3 mg/dL 9.7 9.5 8.8(L)  Total Protein 6.5 - 8.1 g/dL 7.1 7.3 6.8  Total Bilirubin 0.3 - 1.2 mg/dL 0.3 0.3 0.3  Alkaline Phos 38 - 126 U/L 88 86 88  AST 15 - 41 U/L 14(L) 16 13(L)  ALT 0 - 44 U/L _2 01/22/2019 [facility]: Tumor Mutational Burden     01/22/2019 PD-L1 Immunohistochemistry Analysis    01/22/2019 Soft Tissue Needle Core Biopsy Surgical Pathology     RADIOGRAPHIC STUDIES: I have personally reviewed the radiological images as listed and agreed with the findings in the report. No results found.  ASSESSMENT & PLAN:   This is a 56 year old male with   1. Metastatic Poorly differentiated lung adenocarcinoma Presented with  Large neck mass, mediastinal mass, renal mass, and questionable bone lesions  no brain mets on MRI brain 01/22/2019 PD-L1 Immunohistochemistry Analysis which revealed "Tumor Proportion Score (TPS) 50%" 05/16/2019 PET/CT ([id number]) revealed "Radiation changes in the right hemithorax, as above. Improving mediastinal nodal metastases. Prior bulky right cervical metastases have resolved. No evidence of metastatic disease in the abdomen/pelvis." 07/19/2019 Esophagus Scan ([id number]) revealed "Mild stricture at the C6-7 level likely related to prior esophageal surgery. Barium tablet was slow to pass through this area but did pass through after 1 minutes. Hiatal hernia with moderate gastroesophageal reflux. Moderate stricture above the hiatal hernia likely due to reflux. Barium tablet did not pass this area. There are changes of esophagitis which are likely due to reflux and possibly radiation as well."  10/10/19 of  CT Chest W Contrast ([id number])- no evidence of lung cancer progression at this time.   2. 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 Symptomatic hemorrhoids-currently no significant bleeding but have caused some mild iron deficiency anemia.  5. Normocytic anemia -Likely due to underlying malignancy   6. S/pThrombocytosis -- likely due to paraneoplastic effect of tumor and reactive due to inflammation and tissue inflammation from RT.-- now resolved.   7. Nicotine dependence -has quit since cancer diagnosis  8. Iron deficiency - likely due to blood loss from hemorrhoids PLAN:  -Labs done today 05/27/2021 were reviewed in detail with the patient as noted above. -Patient has no new prohibitive toxicities from his maintenance atezolizumab immunotherapy at this time. -His PET CT scan from 04/13/2022 were discussed with him by my Terisa Starr on his last  visit and we again discussed this in details today. He notes that he had a significant upper respiratory infection around the time that he got his PET scan and we discussed that there is a possibility his lymphadenopathy could be reactive. We discussed that subsequent lines of treatment would involve likely chemotherapy. -  After detailed discussion there was shared decision making about continuing his maintenance immunotherapy at this time and close follow-up with repeat PET CT scan to evaluate for resolution of possible reactive lymph nodes in the chest versus progression suggesting true recurrence of his lung cancer. We also wanted to reevaluate his adrenal nodules.  No primary toxicities from continuing his maintenance atezolizumab at this time -Continue p.o. iron polysaccharide 150 mg p.o. daily  9. Erectile dysfunction likely due to previous history of smoking and cancer related  Pains testosterone level adequate. Patient has no cardiac limitations that are known at this time. Plan -Urology appointment still pending -As needed Viagra prescribed.  10. Rt hand numbness ? 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   FOLLOW UP: note: Pet/CT in 2 weeks Please schedule next 2 cycles of Atezolizumab with port flush , labs and MD visits      All of the patient's questions were answered with apparent satisfaction. The patient knows to call the clinic with any problems, questions or concerns.   Sullivan Lone MD [facility] AAHIVMS [facility] Hematology/Oncology Physician [facility]

## 2021-06-10 ENCOUNTER — Ambulatory Visit (HOSPITAL_COMMUNITY): Payer: Medicaid Other

## 2021-06-12 ENCOUNTER — Telehealth: Payer: Self-pay | Admitting: Hematology

## 2021-06-12 NOTE — Telephone Encounter (Signed)
Sch per 2/2 inbasket, pt aware

## 2021-06-17 ENCOUNTER — Inpatient Hospital Stay: Payer: Medicaid Other | Admitting: Hematology

## 2021-06-17 ENCOUNTER — Inpatient Hospital Stay: Payer: Medicaid Other

## 2021-06-19 ENCOUNTER — Other Ambulatory Visit: Payer: Self-pay

## 2021-06-19 ENCOUNTER — Ambulatory Visit (HOSPITAL_COMMUNITY)
Admission: RE | Admit: 2021-06-19 | Discharge: 2021-06-19 | Disposition: A | Discharge status: Home / Self Care | Payer: Medicaid Other | Source: Ambulatory Visit | Attending: Hematology | Admitting: Hematology

## 2021-06-19 DIAGNOSIS — C3491 Malignant neoplasm of unspecified part of right bronchus or lung: Secondary | ICD-10-CM | POA: Diagnosis present

## 2021-06-19 DIAGNOSIS — J320 Chronic maxillary sinusitis: Secondary | ICD-10-CM | POA: Diagnosis not present

## 2021-06-19 DIAGNOSIS — C3411 Malignant neoplasm of upper lobe, right bronchus or lung: Secondary | ICD-10-CM | POA: Diagnosis not present

## 2021-06-19 DIAGNOSIS — J700 Acute pulmonary manifestations due to radiation: Secondary | ICD-10-CM | POA: Diagnosis not present

## 2021-06-19 DIAGNOSIS — D1771 Benign lipomatous neoplasm of kidney: Secondary | ICD-10-CM | POA: Diagnosis not present

## 2021-06-19 LAB — GLUCOSE, CAPILLARY: Glucose-Capillary: 105 mg/dL — ABNORMAL HIGH (ref 70–99)

## 2021-06-19 IMAGING — PT NM PET TUM IMG RESTAG (PS) SKULL BASE T - THIGH
7 series · 25 of 25 positions shown · non-contrast
Comparison: [DATE] PET-CT.

CLINICAL DATA: Subsequent treatment strategy for non-small cell
right upper lobe lung cancer status post chemotherapy, radiation
therapy and immunotherapy. Indeterminate mediastinal and right hilar
lymph nodes in the setting of upper respiratory infection.

EXAM:
NUCLEAR MEDICINE PET SKULL BASE TO THIGH
TECHNIQUE: 6.8 mCi F-18 FDG was injected intravenously. Full-ring PET imaging
was performed from the skull base to thigh after the radiotracer. CT
data was obtained and used for attenuation correction and anatomic
localization.
Fasting blood glucose: 105 mg/dl

[Series 3: pet sk_thigh ac · axial · 5.0mm · 4.07mm/px · z∈[-1020,-72]mm · 5 of 238 slices shown]
[im 1/238]
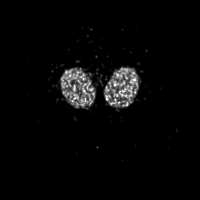
[im 60/238]
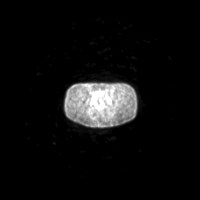
[im 119/238]
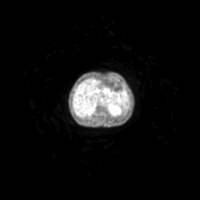
[im 178/238]
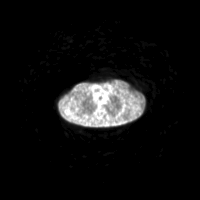
[im 238/238]
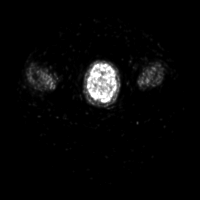

[Series 4: ct sk_thigh 5.0 bf37 · axial · 5.0mm · 0.87mm/px · z∈[-1020,-72]mm · 5 of 238 slices shown]
[im 1/238]
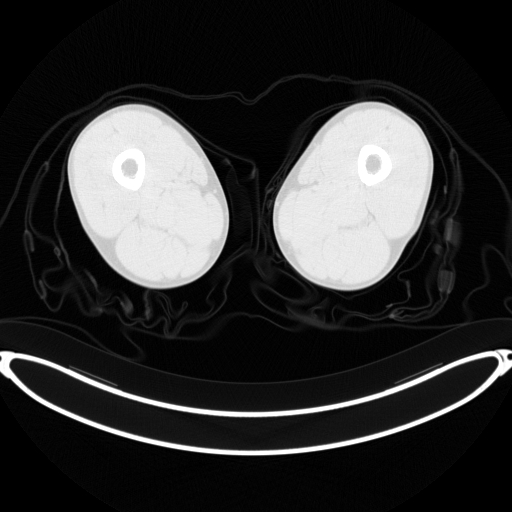
[im 60/238]
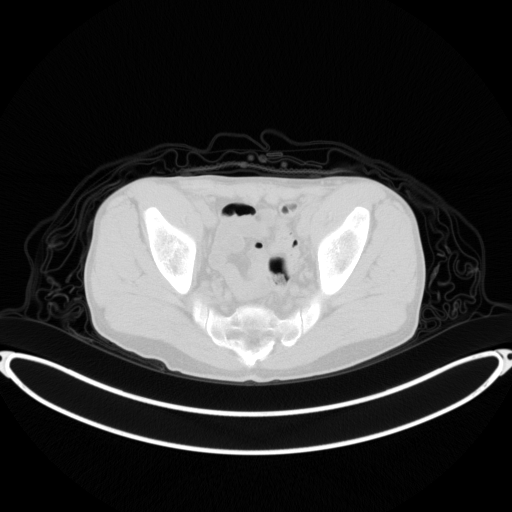
[im 119/238]
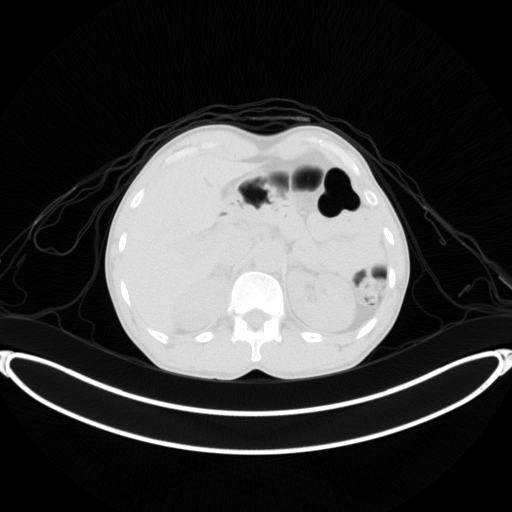
[im 178/238]
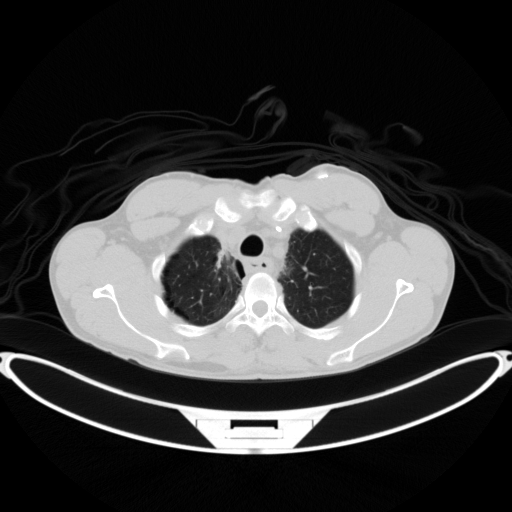
[im 238/238  brain]
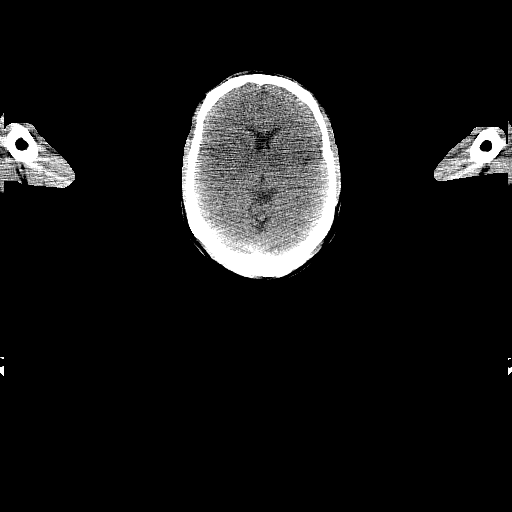

[Series 5: pet sk_thigh nac · axial · 5.0mm · 4.07mm/px · z∈[-1020,-72]mm · 6 of 238 slices shown]
[im 1/238]
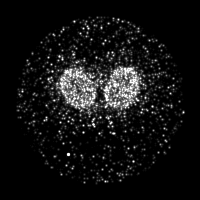
[im 48/238]
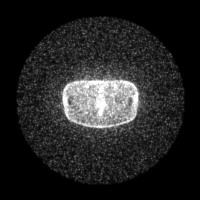
[im 95/238]
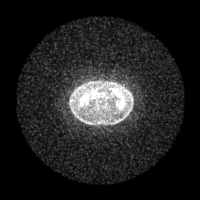
[im 143/238]
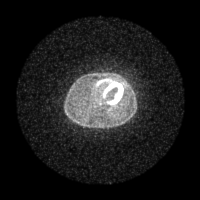
[im 190/238]
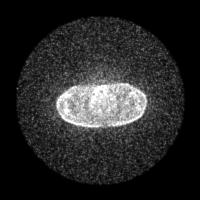
[im 238/238]
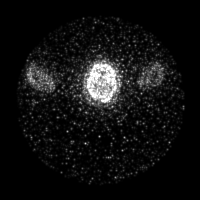

[Series 8: ct sk_thigh 5.0 br59 lung_bone · axial · 5.0mm · 0.52mm/px · z∈[-530,-250]mm · 2 of 71 slices shown]
[im 1/71]
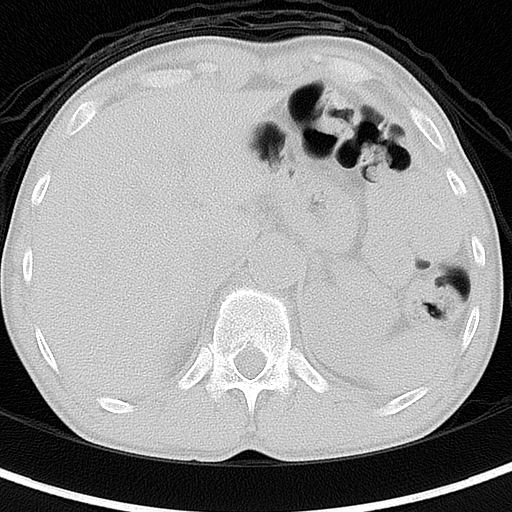
[im 71/71]
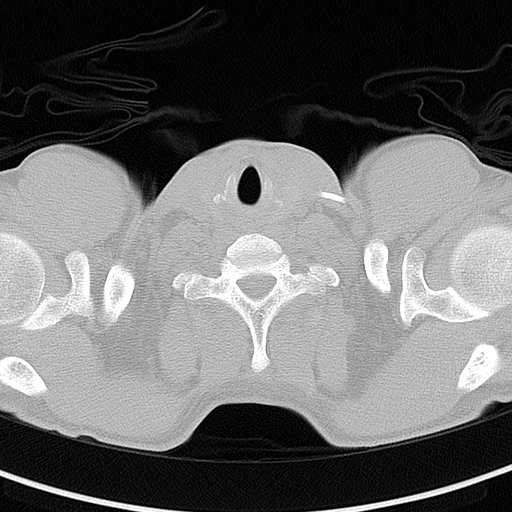

[Series 603: fused cor · 1 of 24 slices shown]
[im 1/24]
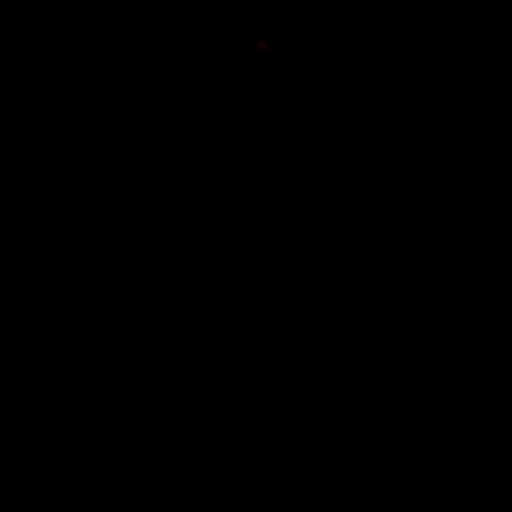

[Series 604: <mip collection> · coronal · 1.97mm/px · 1 of 32 slices shown]
[im 1/32]
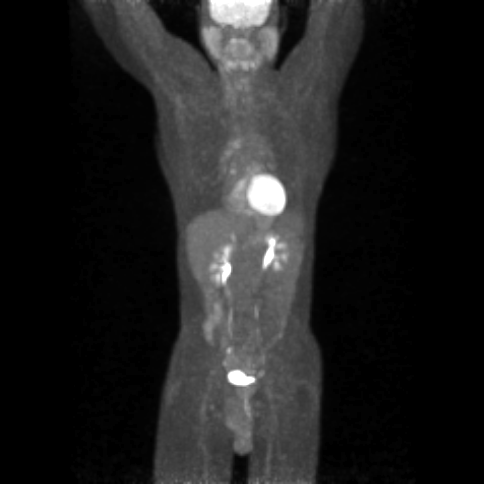

[Series 605: range-ct sk_thigh 5.0 bf37-tra-<alpha range> · 5 of 218 slices shown]
[im 1/218]
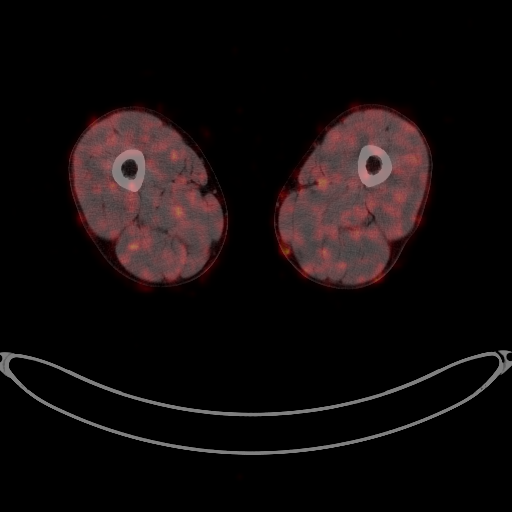
[im 55/218]
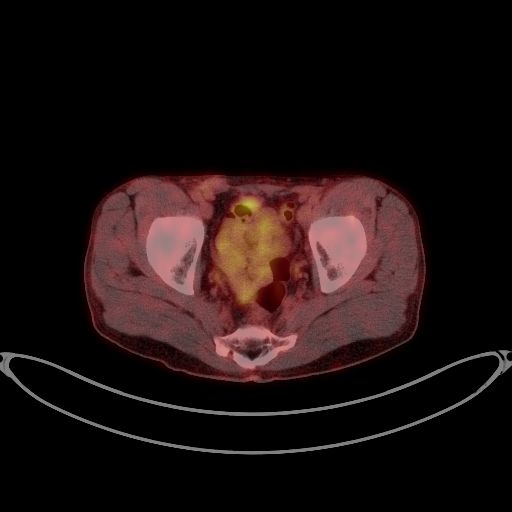
[im 109/218]
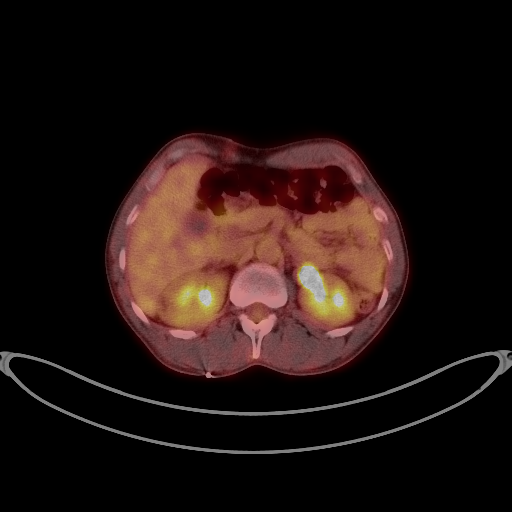
[im 163/218]
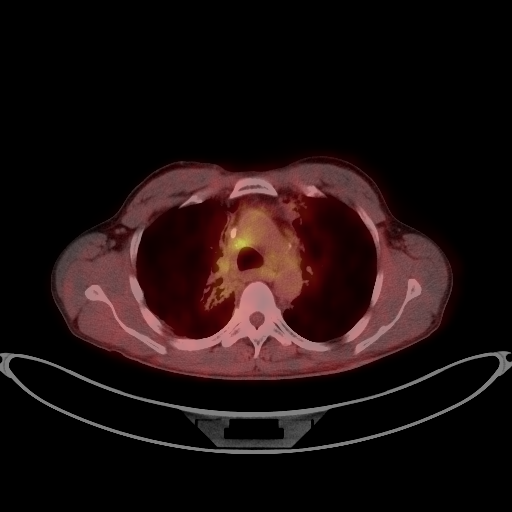
[im 218/218]
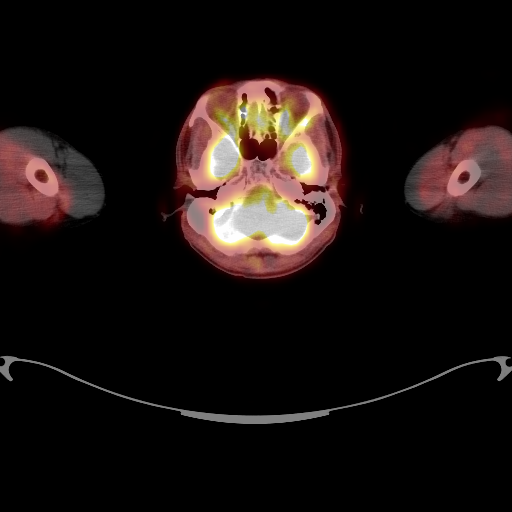

[25 of 25 positions shown; findings below may reference images not displayed]

FINDINGS: Mediastinal blood pool activity: SUV max

Liver activity: SUV max NA

NECK: No hypermetabolic lymph nodes in the neck.

Incidental CT findings: Chronic complete opacification of the left
maxillary sinus. Left internal jugular Port-A-Cath terminates at the
cavoatrial junction.

CHEST:

No new hypermetabolic pulmonary findings. Low level non focal FDG
uptake associated with sharply marginated paramediastinal
consolidation in the right greater than left upper lungs bilaterally
with associated bronchiectasis, volume loss and distortion,
compatible with chronic radiation fibrosis.

Mildly enlarged mildly hypermetabolic 1.2 cm right paratracheal node
with max SUV 4.2 (series 4/image 71), previously 1.2 cm with max SUV
5.2, stable in size and mildly decreased in metabolism.

Mildly enlarged mildly hypermetabolic azygoesophageal recess 1.0 cm
node with max SUV 4.6 (series 4/image 84), previously 1.0 cm with
max SUV 4.4, not appreciably changed in size or metabolism.

Mildly hypermetabolic right hilar adenopathy with max SUV 3.6,
previous max SUV 4.3, mildly decreased in metabolism.

No hypermetabolic left hilar nodes. No enlarged or hypermetabolic
axillary lymph nodes. No new enlarged or hypermetabolic mediastinal
nodes.

Incidental CT findings: Moderate paraseptal and mild centrilobular
emphysema. Atherosclerotic nonaneurysmal thoracic aorta.

ABDOMEN/PELVIS: No abnormal hypermetabolic activity within the
liver, pancreas, left adrenal gland, or spleen. No hypermetabolic
lymph nodes in the abdomen or pelvis.

Inferior right adrenal 1.4 cm low-attenuation nodule with density 5
HU demonstrates no hypermetabolism and is stable since baseline
[DATE] PET-CT, compatible with a benign adenoma.

Separate more superior 1.2 cm right adrenal nodule with density 37
HU demonstrates hypermetabolism with max SUV 4.9 (series 4/image
116), previously 1.2 cm with max SUV 3.4, stable in size and mildly
increased in metabolism.

Incidental CT findings: Subcentimeter hypodense anterior left liver
lesion is too small to characterize and unchanged. Scattered
punctate splenic calcifications are unchanged. Fat containing 1.4 cm
lateral upper right renal cortical lesion is stable and most
compatible with an angiomyolipoma as described on prior MRI.
Top-normal size prostate. Atherosclerotic nonaneurysmal abdominal
aorta.

SKELETON: No focal hypermetabolic activity to suggest skeletal
metastasis.

Incidental CT findings: none
IMPRESSION: 1. The mildly enlarged mildly hypermetabolic right paratracheal,
right hilar and azygoesophageal recess lymph nodes described on
[DATE] PET-CT are all stable in size and stable to decreased in
metabolism. These nodes remain equivocal for improving inflammatory
uptake versus metastatic disease.
2. Superior right adrenal hypermetabolic 1.2 cm nodule with
indeterminate CT density, stable in size and mildly increased in
metabolism since [DATE] PET-CT, cannot exclude mild metabolic
progression of a small right adrenal metastasis.
3. Please note that there is a separate chronic inferior right
adrenal nodule that is unchanged and has been previously
definitively characterized as a benign adenoma. The two separate
right adrenal nodules were best distinguished on the
contrast-enhanced [DATE] chest CT study.
4. No new sites of hypermetabolic metastatic disease.
5. Chronic radiation fibrosis in the paramediastinal upper lungs,
right greater than left.
6. Chronic findings include: Aortic Atherosclerosis ([M2]-[M2])
and Emphysema ([M2]-[M2]). Chronic left maxillary sinusitis.
Stable small upper right renal angiomyolipoma.

## 2021-06-19 MED ORDER — FLUDEOXYGLUCOSE F - 18 (FDG) INJECTION
6.8000 | Freq: Once | INTRAVENOUS | Status: AC
  Administered 2021-06-19: 6.8 via INTRAVENOUS

## 2021-06-23 ENCOUNTER — Inpatient Hospital Stay: Payer: Medicaid Other

## 2021-06-23 ENCOUNTER — Other Ambulatory Visit: Payer: Self-pay

## 2021-06-23 ENCOUNTER — Inpatient Hospital Stay: Payer: Medicaid Other | Attending: Physician Assistant | Admitting: Hematology

## 2021-06-23 VITALS — BP 137/67 | HR 66 | Temp 98.4°F | Resp 18

## 2021-06-23 VITALS — Wt 139.3 lb

## 2021-06-23 DIAGNOSIS — K625 Hemorrhage of anus and rectum: Secondary | ICD-10-CM | POA: Insufficient documentation

## 2021-06-23 DIAGNOSIS — Z5112 Encounter for antineoplastic immunotherapy: Secondary | ICD-10-CM | POA: Diagnosis not present

## 2021-06-23 DIAGNOSIS — C3491 Malignant neoplasm of unspecified part of right bronchus or lung: Secondary | ICD-10-CM | POA: Insufficient documentation

## 2021-06-23 DIAGNOSIS — Z95828 Presence of other vascular implants and grafts: Secondary | ICD-10-CM

## 2021-06-23 DIAGNOSIS — Z7901 Long term (current) use of anticoagulants: Secondary | ICD-10-CM | POA: Insufficient documentation

## 2021-06-23 DIAGNOSIS — Z86718 Personal history of other venous thrombosis and embolism: Secondary | ICD-10-CM | POA: Diagnosis not present

## 2021-06-23 DIAGNOSIS — D509 Iron deficiency anemia, unspecified: Secondary | ICD-10-CM | POA: Insufficient documentation

## 2021-06-23 DIAGNOSIS — K649 Unspecified hemorrhoids: Secondary | ICD-10-CM | POA: Diagnosis not present

## 2021-06-23 DIAGNOSIS — C771 Secondary and unspecified malignant neoplasm of intrathoracic lymph nodes: Secondary | ICD-10-CM | POA: Diagnosis present

## 2021-06-23 DIAGNOSIS — C7970 Secondary malignant neoplasm of unspecified adrenal gland: Secondary | ICD-10-CM

## 2021-06-23 DIAGNOSIS — Z79899 Other long term (current) drug therapy: Secondary | ICD-10-CM | POA: Insufficient documentation

## 2021-06-23 DIAGNOSIS — Z87891 Personal history of nicotine dependence: Secondary | ICD-10-CM | POA: Diagnosis not present

## 2021-06-23 DIAGNOSIS — Z7189 Other specified counseling: Secondary | ICD-10-CM

## 2021-06-23 LAB — CMP (CANCER CENTER ONLY)
ALT: 13 U/L (ref 10–47)
AST: 16 U/L (ref 11–38)
Albumin: 4.3 g/dL (ref 3.5–5.0)
Alkaline Phosphatase: 93 U/L (ref 38–126)
Anion gap: 5 (ref 5–15)
BUN: 12 mg/dL (ref 6–20)
CO2: 28 mmol/L (ref 22–32)
Calcium: 9.8 mg/dL (ref 8.9–10.3)
Chloride: 105 mmol/L (ref 98–111)
Creatinine: 0.88 mg/dL (ref 0.60–1.20)
GFR, Estimated: >60 mL/min (ref >60)
Glucose, Bld: 111 mg/dL — ABNORMAL HIGH (ref 70–99)
Potassium: 4.2 mmol/L (ref 3.5–5.1)
Sodium: 138 mmol/L (ref 135–145)
Total Bilirubin: 0.4 mg/dL (ref 0.2–1.6)
Total Protein: 7 g/dL (ref 6.5–8.1)

## 2021-06-23 LAB — CBC WITH DIFFERENTIAL/PLATELET
Abs Immature Granulocytes: 0.02 10*3/uL (ref 0.00–0.07)
Basophils Absolute: 0 10*3/uL (ref 0.0–0.1)
Basophils Relative: 1 %
Eosinophils Absolute: 0.1 10*3/uL (ref 0.0–0.5)
Eosinophils Relative: 1 %
HCT: 37.5 % — ABNORMAL LOW (ref 39.0–52.0)
Hemoglobin: 11.9 g/dL — ABNORMAL LOW (ref 13.0–17.0)
Immature Granulocytes: 0 %
Lymphocytes Relative: 27 %
Lymphs Abs: 1.3 10*3/uL (ref 0.7–4.0)
MCH: 26 pg (ref 26.0–34.0)
MCHC: 31.7 g/dL (ref 30.0–36.0)
MCV: 81.9 fL (ref 80.0–100.0)
Monocytes Absolute: 0.7 10*3/uL (ref 0.1–1.0)
Monocytes Relative: 15 %
Neutro Abs: 2.6 10*3/uL (ref 1.7–7.7)
Neutrophils Relative %: 56 %
Platelets: 243 10*3/uL (ref 150–400)
RBC: 4.58 MIL/uL (ref 4.22–5.81)
RDW: 17.4 % — ABNORMAL HIGH (ref 11.5–15.5)
WBC: 4.8 10*3/uL (ref 4.0–10.5)
nRBC: 0 % (ref 0.0–0.2)

## 2021-06-23 MED ORDER — DIPHENHYDRAMINE HCL 25 MG PO CAPS
25.0000 mg | ORAL_CAPSULE | Freq: Once | ORAL | Status: AC
  Administered 2021-06-23: 25 mg via ORAL
  Filled 2021-06-23: qty 1

## 2021-06-23 MED ORDER — HEPARIN SOD (PORK) LOCK FLUSH 100 UNIT/ML IV SOLN
500.0000 [IU] | Freq: Once | INTRAVENOUS | Status: AC | PRN
  Administered 2021-06-23: 500 [IU]

## 2021-06-23 MED ORDER — SODIUM CHLORIDE 0.9 % IV SOLN
1200.0000 mg | Freq: Once | INTRAVENOUS | Status: AC
  Administered 2021-06-23: 1200 mg via INTRAVENOUS
  Filled 2021-06-23: qty 20

## 2021-06-23 MED ORDER — PHENYLEPHRINE IN HARD FAT 0.25 % RE SUPP: 1.0000 | Freq: Two times a day (BID) | RECTAL | 0 refills | Status: DC | PRN

## 2021-06-23 MED ORDER — ACETAMINOPHEN 325 MG PO TABS: 650.0000 mg | ORAL_TABLET | Freq: Once | ORAL | Status: DC

## 2021-06-23 MED ORDER — ACETAMINOPHEN 325 MG PO TABS
650.0000 mg | ORAL_TABLET | Freq: Once | ORAL | Status: AC
  Administered 2021-06-23: 650 mg via ORAL
  Filled 2021-06-23: qty 2

## 2021-06-23 MED ORDER — SODIUM CHLORIDE 0.9 % IV SOLN: Freq: Once | INTRAVENOUS | Status: AC

## 2021-06-23 MED ORDER — SODIUM CHLORIDE 0.9% FLUSH
10.0000 mL | INTRAVENOUS | Status: DC | PRN
  Administered 2021-06-23: 10 mL

## 2021-06-23 MED ORDER — SODIUM CHLORIDE 0.9 % IV SOLN
200.0000 mg | Freq: Once | INTRAVENOUS | Status: AC
  Administered 2021-06-23: 200 mg via INTRAVENOUS
  Filled 2021-06-23: qty 200

## 2021-06-23 MED ORDER — LORATADINE 10 MG PO TABS
10.0000 mg | ORAL_TABLET | Freq: Every day | ORAL | Status: DC
  Administered 2021-06-23: 10 mg via ORAL
  Filled 2021-06-23: qty 1

## 2021-06-23 MED ORDER — SODIUM CHLORIDE 0.9 % IV SOLN: Freq: Once | INTRAVENOUS | Status: DC

## 2021-06-23 MED ORDER — FAMOTIDINE 20 MG PO TABS
20.0000 mg | ORAL_TABLET | Freq: Once | ORAL | Status: AC
  Administered 2021-06-23: 20 mg via ORAL
  Filled 2021-06-23: qty 1

## 2021-06-23 NOTE — Progress Notes (Signed)
Per Dr Irene Limbo pt to receive iron infusion today if time allow; ok'd per charge

## 2021-06-23 NOTE — Patient Instructions (Signed)
[facility] ONCOLOG[facility]  Discharge Instructions: Thank you for choosing [facility] to provide your oncology and hematology care.   If you have a lab appointment with the [facility], please go directly to the [facility] and check in at the registration area.   Wear comfortable clothing and clothing appropriate for easy access to any Portacath or PICC line.   We strive to give you quality time with your provider. You may need to reschedule your appointment if you arrive late (15 or more minutes).  Arriving late affects you and other patients whose appointments are after yours.  Also, if you miss three or more appointments without notifying the office, you may be dismissed from the clinic at the providers discretion.      For prescription refill requests, have your pharmacy contact our office and allow 72 hours for refills to be completed.    Today you received the following chemotherapy and/or immunotherapy agents: atezolizumab      To help prevent nausea and vomiting after your treatment, we encourage you to take your nausea medication as directed.  BELOW ARE SYMPTOMS THAT SHOULD BE REPORTED IMMEDIATELY: *FEVER GREATER THAN 100.4 F (38 C) OR HIGHER *CHILLS OR SWEATING *NAUSEA AND VOMITING THAT IS NOT CONTROLLED WITH YOUR NAUSEA MEDICATION *UNUSUAL SHORTNESS OF BREATH *UNUSUAL BRUISING OR BLEEDING *URINARY PROBLEMS (pain or burning when urinating, or frequent urination) *BOWEL PROBLEMS (unusual diarrhea, constipation, pain near the anus) TENDERNESS IN MOUTH AND THROAT WITH OR WITHOUT PRESENCE OF ULCERS (sore throat, sores in mouth, or a toothache) UNUSUAL RASH, SWELLING OR PAIN  UNUSUAL VAGINAL DISCHARGE OR ITCHING   Items with * indicate a potential emergency and should be followed up as soon as possible or go to the Emergency Department if any problems should occur.  Please show the CHEMOTHERAPY ALERT CARD or IMMUNOTHERAPY ALERT CARD at check-in  to the Emergency Department and triage nurse.  Should you have questions after your visit or need to cancel or reschedule your appointment, please contact [facility]  Dept: [phone]  and follow the prompts.  Office hours are 8:00 a.m. to 4:30 p.m. Monday - Friday. Please note that voicemails left after 4:00 p.m. may not be returned until the following business day.  We are closed weekends and major holidays. You have access to a nurse at all times for urgent questions. Please call the main number to the clinic Dept: [phone] and follow the prompts.   For any non-urgent questions, you may also contact your provider using MyChart. We now offer e-Visits for anyone 49 and older to request care online for non-urgent symptoms. For details visit [email or web address].   Also download the MyChart app! Go to the app store, search "MyChart", open the app, select [facility], and log in with your MyChart username and password.  Due to Covid, a mask is required upon entering the hospital/clinic. If you do not have a mask, one will be given to you upon arrival. For doctor visits, patients may have 1 support person aged 46 or older with them. For treatment visits, patients cannot have anyone with them due to current Covid guidelines and our immunocompromised population.   Iron Sucrose Injection What is this medication? IRON SUCROSE (EYE ern SOO krose) treats low levels of iron (iron deficiency anemia) in people with kidney disease. Iron is a mineral that plays an important role in making red blood cells, which carry oxygen from your lungs to the rest  of your body. This medicine may be used for other purposes; ask your health care provider or pharmacist if you have questions. COMMON BRAND NAME(S): Venofer What should I tell my care team before I take this medication? They need to know if you have any of these conditions: Anemia not caused by low iron levels Heart  disease High levels of iron in the blood Kidney disease Liver disease An unusual or allergic reaction to iron, other medications, foods, dyes, or preservatives Pregnant or trying to get pregnant Breast-feeding How should I use this medication? This medication is for infusion into a vein. It is given in a hospital or clinic setting. Talk to your care team about the use of this medication in children. While this medication may be prescribed for children as young as 2 years for selected conditions, precautions do apply. Overdosage: If you think you have taken too much of this medicine contact a poison control center or emergency room at once. NOTE: This medicine is only for you. Do not share this medicine with others. What if I miss a dose? It is important not to miss your dose. Call your care team if you are unable to keep an appointment. What may interact with this medication? Do not take this medication with any of the following: Deferoxamine Dimercaprol Other iron products This medication may also interact with the following: Chloramphenicol Deferasirox This list may not describe all possible interactions. Give your health care provider a list of all the medicines, herbs, non-prescription drugs, or dietary supplements you use. Also tell them if you smoke, drink alcohol, or use illegal drugs. Some items may interact with your medicine. What should I watch for while using this medication? Visit your care team regularly. Tell your care team if your symptoms do not start to get better or if they get worse. You may need blood work done while you are taking this medication. You may need to follow a special diet. Talk to your care team. Foods that contain iron include: whole grains/cereals, dried fruits, beans, or peas, leafy green vegetables, and organ meats (liver, kidney). What side effects may I notice from receiving this medication? Side effects that you should report to your care team as  soon as possible: Allergic reactions--skin rash, itching, hives, swelling of the face, lips, tongue, or throat Low blood pressure--dizziness, feeling faint or lightheaded, blurry vision Shortness of breath Side effects that usually do not require medical attention (report to your care team if they continue or are bothersome): Flushing Headache Joint pain Muscle pain Nausea Pain, redness, or irritation at injection site This list may not describe all possible side effects. Call your doctor for medical advice about side effects. You may report side effects to FDA at 1-800-FDA-1088. Where should I keep my medication? This medication is given in a hospital or clinic and will not be stored at home. NOTE: This sheet is a summary. It may not cover all possible information. If you have questions about this medicine, talk to your doctor, pharmacist, or health care provider.  2022 Elsevier/Gold Standard (2020-09-19 00:00:00)

## 2021-06-23 NOTE — Progress Notes (Signed)
HEMATOLOGY/ONCOLOGY CLINIC NOTE  Date of Service: 06/23/2021   Patient Care Team: [name], MD as PCP - General (Family Medicine)   CHIEF COMPLAINTS/PURPOSE OF CONSULTATION:  Follow-up for continued management of metastatic poorly differentiated lung adenocarcinoma and PET/CT results.  HISTORY OF PRESENTING ILLNESS:  Please see previous notes for details on initial presentation.  Current Treatment:  Atezolizumab maintenance   INTERVAL HISTORY:   David Berry. Is a 56 y/o male here for continued evaluation and management of his metastatic poorly differentiated lung adenocarcinoma. Pt denies any new acute symptoms. He feels that he could improve his diet. He currently takes a blood thinner. He has been experiencing rectal bleeding every few days due to his hemorrhoids. Pt denies any drowsiness or exhaustion.  No new toxicities from his atezolizumab. Repeat PET CT scan from 06/19/2021 was discussed in details.  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[place])    pt denies this   GERD (gastroesophageal reflux disease)    Lung cancer ([place]) dx'd 01/2019   Peripheral vascular disease ([place])    blood clot in neck Sept 12, 2020    SURGICAL HISTORY: Past Surgical History:  Procedure Laterality Date   APPENDECTOMY     teenager   INGUINAL HERNIA REPAIR Right 2016, 2017   [place], [facility]-removed mesh   IR IMAGING GUIDED PORT INSERTION  04/26/2019   TOOTH EXTRACTION N/A 03/14/2020   Procedure: DENTAL RESTORATION/EXTRACTIONS;  Surgeon: Diona Browner, DDS;  Location: [facility];  Service: Oral Surgery;  Laterality: N/A;    SOCIAL HISTORY: Social History   Socioeconomic History   Marital status: Single    Spouse name: Not on file   Number of children: 1   Years of education: GED   Highest education level: Some college, no  degree  Occupational History    Comment: fork lift driver  Tobacco Use   Smoking status: Former    Types: Cigars    Quit date: 01/20/2019    Years since quitting: 2.4   Smokeless tobacco: Never   Tobacco comments:    smokes 3 black and mild cigars per day  Substance and Sexual Activity   Alcohol use: Not Currently   Drug use: No   Sexual activity: Yes  Other Topics Concern   Not on file  Social History Narrative   Lives alone   Caffeine- coffee, 20 oz daily, tea occas   Social Determinants of Health   Financial Resource Strain: Low Risk    Difficulty of Paying Living Expenses: Not very hard  Food Insecurity: No Food Insecurity   Worried About Charity fundraiser in the Last Year: Never true   Ran Out of Food in the Last Year: Never true  Transportation Needs: No Transportation Needs   Lack of Transportation (Medical): No   Lack of Transportation (Non-Medical): No  Physical Activity: Sufficiently Active   Days of Exercise per Week: 3 days   Minutes of Exercise per Session: 150+ min  Stress: No Stress Concern Present   Feeling of Stress : Not at all  Social Connections: Socially Isolated   Frequency of Communication with Friends and Family: More than three times a week   Frequency of Social Gatherings with Friends and Family: Once a week   Attends Religious Services: Never  Active Member of Clubs or Organizations: No   Attends Music therapist: Not on file   Marital Status: Never married  Intimate Partner Violence: Not on file    FAMILY HISTORY: Family History  Problem Relation Age of Onset   Prostate cancer Father     ALLERGIES:  has no active allergies.  MEDICATIONS:  Current Outpatient Medications  Medication Sig Dispense Refill   apixaban (ELIQUIS) 5 MG TABS tablet Take 1 tablet (5 mg total) by mouth 2 (two) times daily. 60 tablet 11   Cyanocobalamin (VITAMIN B 12 PO) Take 2,500 mcg by mouth daily.     esomeprazole (NEXIUM) 40 MG capsule Take  1 capsule (40 mg total) by mouth daily. 90 capsule 2   lidocaine-prilocaine (EMLA) cream Apply 1 application topically as needed. 30 g 0   pregabalin (LYRICA) 50 MG capsule Take 1 capsule (50 mg total) by mouth 2 (two) times daily. (Patient not taking: Reported on 05/27/2021) 60 capsule 3   propranolol (INDERAL) 20 MG tablet Take 1 tablet (20 mg total) by mouth 2 (two) times daily. 60 tablet 5   sildenafil (VIAGRA) 50 MG tablet Take 1 tablet (50 mg total) by mouth daily as needed for erectile dysfunction. Take 30 mins to 4 hours prior to sexual activity 20 tablet 1   No current facility-administered medications for this visit.   REVIEW OF SYSTEMS:   10 Point review of Systems was done is negative except as noted above.  PHYSICAL EXAMINATION: ECOG FS:1 - Symptomatic but completely ambulatory  There were no vitals filed for this visit.   Wt Readings from Last 3 Encounters:  05/27/21 138 lb (62.6 kg)  05/18/21 142 lb 6.4 oz (64.6 kg)  05/06/21 141 lb 3.2 oz (64 kg)   There is no height or weight on file to calculate BMI.   [name] NAD GENERAL:alert, in no acute distress and comfortable SKIN: no acute rashes, no significant lesions EYES: conjunctiva are pink and non-injected, sclera anicteric OROPHARYNX: MMM, no exudates, no oropharyngeal erythema or ulceration NECK: supple, no JVD LYMPH:  no palpable lymphadenopathy in the cervical, axillary or inguinal regions LUNGS: clear to auscultation b/l with normal respiratory effort HEART: regular rate & rhythm ABDOMEN:  normoactive bowel sounds , non tender, not distended. Extremity: no pedal edema PSYCH: alert & oriented x 3 with fluent speech NEURO: no focal motor/sensory deficits    LABORATORY DATA:  I have reviewed the data as listed  . CBC Latest Ref Rng & Units 06/23/2021 05/27/2021 05/06/2021  WBC 4.0 - 10.5 K/uL 4.8 3.9(L) 4.9  Hemoglobin 13.0 - 17.0 g/dL 11.9(L) 11.8(L) 11.2(L)  Hematocrit 39.0 - 52.0 % 37.5(L) 38.2(L) 37.0(L)   Platelets 150 - 400 K/uL 243 246 362    . CMP Latest Ref Rng & Units 06/23/2021 05/27/2021 05/06/2021  Glucose 70 - 99 mg/dL 111(H) 103(H) 96  BUN 6 - 20 mg/dL $Remove'12 9 13  'Degvdpw$ Creatinine 0.60 - 1.20 mg/dL 0.88 0.85 0.87  Sodium 135 - 145 mmol/L 138 137 140  Potassium 3.5 - 5.1 mmol/L 4.2 4.1 3.8  Chloride 98 - 111 mmol/L 105 103 106  CO2 22 - 32 mmol/L $RemoveB'28 30 26  'PWlabHBt$ Calcium 8.9 - 10.3 mg/dL 9.8 9.7 9.5  Total Protein 6.5 - 8.1 g/dL 7.0 7.1 7.3  Total Bilirubin 0.2 - 1.6 mg/dL 0.4 0.3 0.3  Alkaline Phos 38 - 126 U/L 93 88 86  AST 11 - 38 U/L 16 14(L) 16  ALT 10 - 47 U/L  $'13 10 17    'K$ 01/22/2019 [facility]: Tumor Mutational Burden     01/22/2019 PD-L1 Immunohistochemistry Analysis    01/22/2019 Soft Tissue Needle Core Biopsy Surgical Pathology     RADIOGRAPHIC STUDIES: I have personally reviewed the radiological images as listed and agreed with the findings in the report. NM PET Image Restag (PS) Skull Base To Thigh  Result Date: 06/21/2021 CLINICAL DATA:  Subsequent treatment strategy for non-small cell right upper lobe lung cancer status post chemotherapy, radiation therapy and immunotherapy. Indeterminate mediastinal and right hilar lymph nodes in the setting of upper respiratory infection. EXAM: NUCLEAR MEDICINE PET SKULL BASE TO THIGH TECHNIQUE: 6.8 mCi F-18 FDG was injected intravenously. Full-ring PET imaging was performed from the skull base to thigh after the radiotracer. CT data was obtained and used for attenuation correction and anatomic localization. Fasting blood glucose: 105 mg/dl COMPARISON:  04/13/2021 PET-CT. FINDINGS: Mediastinal blood pool activity: SUV max 2.0 Liver activity: SUV max NA NECK: No hypermetabolic lymph nodes in the neck. Incidental CT findings: Chronic complete opacification of the left maxillary sinus. Left internal jugular Port-A-Cath terminates at the cavoatrial junction. CHEST: No new hypermetabolic pulmonary findings. Low level non focal FDG uptake  associated with sharply marginated paramediastinal consolidation in the right greater than left upper lungs bilaterally with associated bronchiectasis, volume loss and distortion, compatible with chronic radiation fibrosis. Mildly enlarged mildly hypermetabolic 1.2 cm right paratracheal node with max SUV 4.2 (series 4/image 71), previously 1.2 cm with max SUV 5.2, stable in size and mildly decreased in metabolism. Mildly enlarged mildly hypermetabolic azygoesophageal recess 1.0 cm node with max SUV 4.6 (series 4/image 84), previously 1.0 cm with max SUV 4.4, not appreciably changed in size or metabolism. Mildly hypermetabolic right hilar adenopathy with max SUV 3.6, previous max SUV 4.3, mildly decreased in metabolism. No hypermetabolic left hilar nodes. No enlarged or hypermetabolic axillary lymph nodes. No new enlarged or hypermetabolic mediastinal nodes. Incidental CT findings: Moderate paraseptal and mild centrilobular emphysema. Atherosclerotic nonaneurysmal thoracic aorta. ABDOMEN/PELVIS: No abnormal hypermetabolic activity within the liver, pancreas, left adrenal gland, or spleen. No hypermetabolic lymph nodes in the abdomen or pelvis. Inferior right adrenal 1.4 cm low-attenuation nodule with density 5 HU demonstrates no hypermetabolism and is stable since baseline 05/16/2019 PET-CT, compatible with a benign adenoma. Separate more superior 1.2 cm right adrenal nodule with density 37 HU demonstrates hypermetabolism with max SUV 4.9 (series 4/image 116), previously 1.2 cm with max SUV 3.4, stable in size and mildly increased in metabolism. Incidental CT findings: Subcentimeter hypodense anterior left liver lesion is too small to characterize and unchanged. Scattered punctate splenic calcifications are unchanged. Fat containing 1.4 cm lateral upper right renal cortical lesion is stable and most compatible with an angiomyolipoma as described on prior MRI. Top-normal size prostate. Atherosclerotic nonaneurysmal  abdominal aorta. SKELETON: No focal hypermetabolic activity to suggest skeletal metastasis. Incidental CT findings: none IMPRESSION: 1. The mildly enlarged mildly hypermetabolic right paratracheal, right hilar and azygoesophageal recess lymph nodes described on 04/13/2021 PET-CT are all stable in size and stable to decreased in metabolism. These nodes remain equivocal for improving inflammatory uptake versus metastatic disease. 2. Superior right adrenal hypermetabolic 1.2 cm nodule with indeterminate CT density, stable in size and mildly increased in metabolism since 04/13/2021 PET-CT, cannot exclude mild metabolic progression of a small right adrenal metastasis. 3. Please note that there is a separate chronic inferior right adrenal nodule that is unchanged and has been previously definitively characterized as a benign adenoma. The two separate  right adrenal nodules were best distinguished on the contrast-enhanced 03/17/2021 chest CT study. 4. No new sites of hypermetabolic metastatic disease. 5. 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 6. Chronic findings include: Aortic Atherosclerosis (ICD10-I70.0) and Emphysema (ICD10-J43.9). Chronic left maxillary sinusitis. Stable small upper right renal angiomyolipoma. Electronically Signed   By: Ilona Sorrel M.D.   On: 06/21/2021 12:30    ASSESSMENT & PLAN:   This is a 56 year old male with   1. Metastatic Poorly differentiated lung adenocarcinoma Presented with Large neck mass, mediastinal mass, renal mass, and questionable bone lesions  no brain mets on MRI brain 01/22/2019 PD-L1 Immunohistochemistry Analysis which revealed "Tumor Proportion Score (TPS) 50%" 05/16/2019 PET/CT ([id number]) revealed "Radiation changes in the right hemithorax, as above. Improving mediastinal nodal metastases. Prior bulky right cervical metastases have resolved. No evidence of metastatic disease in the abdomen/pelvis." 07/19/2019  Esophagus Scan ([id number]) revealed "Mild stricture at the C6-7 level likely related to prior esophageal surgery. Barium tablet was slow to pass through this area but did pass through after 1 minutes. Hiatal hernia with moderate gastroesophageal reflux. Moderate stricture above the hiatal hernia likely due to reflux. Barium tablet did not pass this area. There are changes of esophagitis which are likely due to reflux and possibly radiation as well."  10/10/19 of  CT Chest W Contrast ([id number])- no evidence of lung cancer progression at this time. He did have a PET CT scan on 04/13/2021 which was reviewed on the previous visit by Murray Hodgkins.  This showed some borderline FDG avid right paratracheal right hilar and azygoesophageal lymph nodes that were suspicious for possible nodal recurrence however the patient notes that he did have a bad upper respiratory tract infection around the time that he had his PET CT scan and therefore reactive adenopathy is a possibility as well.    2. 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 Symptomatic hemorrhoids-currently no significant bleeding but have caused some mild iron deficiency anemia.  5. Normocytic anemia -Likely due to underlying malignancy   6. S/pThrombocytosis -- likely due to paraneoplastic effect of tumor and reactive due to inflammation and tissue inflammation from RT.-- now resolved.   7. Nicotine dependence -has quit since cancer diagnosis  8. Iron deficiency - likely due to blood loss from hemorrhoids   PLAN:  -Labs done 06/23/2021 show stable CBC hemoglobin of 11.9 normal WBC count and platelets -CMP-wnl -PET CT scan from 06/19/2021 reviewed with the patient in detail.  No new areas of hyper metabolic metastatic disease.  Mildly enlarged mildly hypermetabolic mediastinal lymph nodes are all stable in size to  slightly decreased in metabolism. Adrenal nodules slightly more hypermetabolic. -We discussed consideration of CT-guided biopsy of one of the adrenal lymph nodes.  This was reviewed by interventional radiology and noted not to be possible. -No further toxicities from his atezolizumab maintenance immunotherapy at this time and we shall continue this. -Continue p.o. iron polysaccharide 150 mg p.o. daily -GI referral to Dr. Rush Landmark for consideration of banding for patient's symptomatic hemorrhoids -Given phenylephrine suppository to use as needed  9. Erectile dysfunction likely due to previous history of smoking and cancer related  Pains testosterone level adequate. Patient has no cardiac limitations that are known at this time. Plan -Urology appointment still pending -As needed Viagra prescribed.  10. Rt hand numbness ?  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   FOLLOW UP: -CT-guided biopsy of adrenal gland lesion to evaluate for possible lung cancer metastasis. -Please schedule for next 2 cycles of atezolizumab with port flush labs and MD visits -Referral to gastroenterology Dr. Rush Landmark for consideration of symptomatic hemorrhoids banding    The total time spent in the appointment was 30 minutes*.  All of the patient's questions were answered with apparent satisfaction. The patient knows to call the clinic with any problems, questions or concerns.   Sullivan Lone MD MS AAHIVMS [facility] Hematology/Oncology Physician [facility]  .*Total Encounter Time as defined by the Centers for Medicare and Medicaid Services includes, in addition to the face-to-face time of a patient visit (documented in the note above) non-face-to-face time: obtaining and reviewing outside history, ordering and reviewing medications, tests or procedures, care coordination (communications with other health care  professionals or caregivers) and documentation in the medical record.

## 2021-06-23 NOTE — Progress Notes (Signed)
Pt declined to stay for 30 minute observation period post Venofer infusion. VSS. Pt had no complaints at time of discharge.

## 2021-06-24 ENCOUNTER — Telehealth: Payer: Self-pay | Admitting: Hematology

## 2021-06-24 ENCOUNTER — Encounter (HOSPITAL_COMMUNITY): Payer: Self-pay | Admitting: Radiology

## 2021-06-24 NOTE — Telephone Encounter (Signed)
Scheduled follow-up appointments per 2/14 los. Patient is aware.

## 2021-06-24 NOTE — Progress Notes (Signed)
Patient Name  David Berry, David Berry. Legal Sex  Male DOB  October 14, 1965 SSN  [id number] Address  [place] [address] Phone  [phone] [facility])  [phone] (Mobile) *Preferred*    FW: CT Biopsy Received: Today Arne Cleveland, MD  Brunetta Genera, MD Cc: Garth Bigness D Too small. No safe window at this time. Liver, kidney, lung in the way.   Not candidate for percutaneous biopsy at this time.   DDH        Previous Messages   ----- Message -----  From: Garth Bigness D  Sent: 06/23/2021   4:33 PM EST  To: [facility] Procedure Requests  Subject: CT Biopsy                                       Procedure:   CT Biopsy   Reason:  Adenocarcinoma of right lung, CT-guided biopsy of adrenal gland FDG avid lesion to evaluate for possible metastasis from lung cancer   History:  [place], CT, NM PET in computer   Provider:  Brunetta Genera   Provider Contact:  [phone]

## 2021-06-29 ENCOUNTER — Encounter: Payer: Self-pay | Admitting: Hematology

## 2021-07-08 ENCOUNTER — Encounter: Payer: Self-pay | Admitting: Hematology

## 2021-07-08 ENCOUNTER — Inpatient Hospital Stay: Payer: Medicare Other

## 2021-07-08 ENCOUNTER — Inpatient Hospital Stay: Payer: Medicare Other | Admitting: Hematology

## 2021-07-14 ENCOUNTER — Other Ambulatory Visit: Payer: Medicaid Other

## 2021-07-14 ENCOUNTER — Ambulatory Visit: Payer: Medicaid Other

## 2021-07-14 ENCOUNTER — Ambulatory Visit: Payer: Medicaid Other | Admitting: Hematology

## 2021-07-15 ENCOUNTER — Inpatient Hospital Stay: Payer: Medicare Other | Attending: Physician Assistant

## 2021-07-15 ENCOUNTER — Other Ambulatory Visit: Payer: Self-pay

## 2021-07-15 ENCOUNTER — Inpatient Hospital Stay: Payer: Medicare Other

## 2021-07-15 ENCOUNTER — Inpatient Hospital Stay (HOSPITAL_BASED_OUTPATIENT_CLINIC_OR_DEPARTMENT_OTHER): Payer: Medicare Other | Admitting: Hematology

## 2021-07-15 VITALS — BP 98/62 | HR 77 | Temp 98.1°F | Resp 16 | Ht 71.5 in | Wt 137.5 lb

## 2021-07-15 DIAGNOSIS — Z7189 Other specified counseling: Secondary | ICD-10-CM

## 2021-07-15 DIAGNOSIS — Z86718 Personal history of other venous thrombosis and embolism: Secondary | ICD-10-CM | POA: Insufficient documentation

## 2021-07-15 DIAGNOSIS — Z7901 Long term (current) use of anticoagulants: Secondary | ICD-10-CM | POA: Diagnosis not present

## 2021-07-15 DIAGNOSIS — Z79899 Other long term (current) drug therapy: Secondary | ICD-10-CM | POA: Diagnosis not present

## 2021-07-15 DIAGNOSIS — Z5112 Encounter for antineoplastic immunotherapy: Secondary | ICD-10-CM | POA: Insufficient documentation

## 2021-07-15 DIAGNOSIS — Z87891 Personal history of nicotine dependence: Secondary | ICD-10-CM | POA: Insufficient documentation

## 2021-07-15 DIAGNOSIS — C3491 Malignant neoplasm of unspecified part of right bronchus or lung: Secondary | ICD-10-CM

## 2021-07-15 DIAGNOSIS — N529 Male erectile dysfunction, unspecified: Secondary | ICD-10-CM | POA: Diagnosis not present

## 2021-07-15 DIAGNOSIS — D649 Anemia, unspecified: Secondary | ICD-10-CM | POA: Diagnosis not present

## 2021-07-15 DIAGNOSIS — C771 Secondary and unspecified malignant neoplasm of intrathoracic lymph nodes: Secondary | ICD-10-CM | POA: Insufficient documentation

## 2021-07-15 DIAGNOSIS — Z95828 Presence of other vascular implants and grafts: Secondary | ICD-10-CM

## 2021-07-15 LAB — CMP (CANCER CENTER ONLY)
ALT: 13 U/L (ref 0–44)
AST: 18 U/L (ref 15–41)
Albumin: 3.9 g/dL (ref 3.5–5.0)
Alkaline Phosphatase: 98 U/L (ref 38–126)
Anion gap: 3 — ABNORMAL LOW (ref 5–15)
BUN: 8 mg/dL (ref 6–20)
CO2: 29 mmol/L (ref 22–32)
Calcium: 9.7 mg/dL (ref 8.9–10.3)
Chloride: 105 mmol/L (ref 98–111)
Creatinine: 0.85 mg/dL (ref 0.61–1.24)
GFR, Estimated: >60 mL/min (ref >60)
Glucose, Bld: 108 mg/dL — ABNORMAL HIGH (ref 70–99)
Potassium: 4.5 mmol/L (ref 3.5–5.1)
Sodium: 137 mmol/L (ref 135–145)
Total Bilirubin: 0.4 mg/dL (ref 0.3–1.2)
Total Protein: 6.4 g/dL — ABNORMAL LOW (ref 6.5–8.1)

## 2021-07-15 LAB — CBC WITH DIFFERENTIAL/PLATELET
Abs Immature Granulocytes: 0 10*3/uL (ref 0.00–0.07)
Basophils Absolute: 0 10*3/uL (ref 0.0–0.1)
Basophils Relative: 1 %
Eosinophils Absolute: 0 10*3/uL (ref 0.0–0.5)
Eosinophils Relative: 1 %
HCT: 36.9 % — ABNORMAL LOW (ref 39.0–52.0)
Hemoglobin: 11.5 g/dL — ABNORMAL LOW (ref 13.0–17.0)
Immature Granulocytes: 0 %
Lymphocytes Relative: 30 %
Lymphs Abs: 1.1 10*3/uL (ref 0.7–4.0)
MCH: 26 pg (ref 26.0–34.0)
MCHC: 31.2 g/dL (ref 30.0–36.0)
MCV: 83.3 fL (ref 80.0–100.0)
Monocytes Absolute: 0.7 10*3/uL (ref 0.1–1.0)
Monocytes Relative: 17 %
Neutro Abs: 2 10*3/uL (ref 1.7–7.7)
Neutrophils Relative %: 51 %
Platelets: 319 10*3/uL (ref 150–400)
RBC: 4.43 MIL/uL (ref 4.22–5.81)
RDW: 17.6 % — ABNORMAL HIGH (ref 11.5–15.5)
WBC: 3.8 10*3/uL — ABNORMAL LOW (ref 4.0–10.5)
nRBC: 0 % (ref 0.0–0.2)

## 2021-07-15 MED ORDER — FAMOTIDINE 20 MG PO TABS
20.0000 mg | ORAL_TABLET | Freq: Once | ORAL | Status: AC
  Administered 2021-07-15: 20 mg via ORAL
  Filled 2021-07-15: qty 1

## 2021-07-15 MED ORDER — ACETAMINOPHEN 325 MG PO TABS
650.0000 mg | ORAL_TABLET | Freq: Once | ORAL | Status: AC
  Administered 2021-07-15: 650 mg via ORAL
  Filled 2021-07-15: qty 2

## 2021-07-15 MED ORDER — SODIUM CHLORIDE 0.9 % IV SOLN
1200.0000 mg | Freq: Once | INTRAVENOUS | Status: AC
  Administered 2021-07-15: 1200 mg via INTRAVENOUS
  Filled 2021-07-15: qty 20

## 2021-07-15 MED ORDER — SODIUM CHLORIDE 0.9 % IV SOLN: Freq: Once | INTRAVENOUS | Status: AC

## 2021-07-15 MED ORDER — SODIUM CHLORIDE 0.9% FLUSH
10.0000 mL | INTRAVENOUS | Status: DC | PRN
  Administered 2021-07-15: 10 mL

## 2021-07-15 MED ORDER — HEPARIN SOD (PORK) LOCK FLUSH 100 UNIT/ML IV SOLN
500.0000 [IU] | Freq: Once | INTRAVENOUS | Status: AC | PRN
  Administered 2021-07-15: 500 [IU]

## 2021-07-15 MED ORDER — DIPHENHYDRAMINE HCL 25 MG PO CAPS
25.0000 mg | ORAL_CAPSULE | Freq: Once | ORAL | Status: AC
  Administered 2021-07-15: 25 mg via ORAL
  Filled 2021-07-15: qty 1

## 2021-07-15 NOTE — Patient Instructions (Signed)
[facility]   ?Discharge Instructions: ?Thank you for choosing [facility] to provide your oncology and hematology care.  ? ?If you have a lab appointment with the [facility], please go directly to the [facility] and check in at the registration area. ?  ?Wear comfortable clothing and clothing appropriate for easy access to any Portacath or PICC line.  ? ?We strive to give you quality time with your provider. You may need to reschedule your appointment if you arrive late (15 or more minutes).  Arriving late affects you and other patients whose appointments are after yours.  Also, if you miss three or more appointments without notifying the office, you may be dismissed from the clinic at the provider?s discretion.    ?  ?For prescription refill requests, have your pharmacy contact our office and allow 72 hours for refills to be completed.   ? ?Today you received the following chemotherapy and/or immunotherapy agents: atezolizumab    ?  ?To help prevent nausea and vomiting after your treatment, we encourage you to take your nausea medication as directed. ? ?BELOW ARE SYMPTOMS THAT SHOULD BE REPORTED IMMEDIATELY: ?*FEVER GREATER THAN 100.4 F (38 ?C) OR HIGHER ?*CHILLS OR SWEATING ?*NAUSEA AND VOMITING THAT IS NOT CONTROLLED WITH YOUR NAUSEA MEDICATION ?*UNUSUAL SHORTNESS OF BREATH ?*UNUSUAL BRUISING OR BLEEDING ?*URINARY PROBLEMS (pain or burning when urinating, or frequent urination) ?*BOWEL PROBLEMS (unusual diarrhea, constipation, pain near the anus) ?TENDERNESS IN MOUTH AND THROAT WITH OR WITHOUT PRESENCE OF ULCERS (sore throat, sores in mouth, or a toothache) ?UNUSUAL RASH, SWELLING OR PAIN  ?UNUSUAL VAGINAL DISCHARGE OR ITCHING  ? ?Items with * indicate a potential emergency and should be followed up as soon as possible or go to the Emergency Department if any problems should occur. ? ?Please show the CHEMOTHERAPY ALERT CARD or IMMUNOTHERAPY ALERT CARD at  check-in to the Emergency Department and triage nurse. ? ?Should you have questions after your visit or need to cancel or reschedule your appointment, please contact [facility]  Dept: [phone]  and follow the prompts.  Office hours are 8:00 a.m. to 4:30 p.m. Monday - Friday. Please note that voicemails left after 4:00 p.m. may not be returned until the following business day.  We are closed weekends and major holidays. You have access to a nurse at all times for urgent questions. Please call the main number to the clinic Dept: [phone] and follow the prompts. ? ? ?For any non-urgent questions, you may also contact your provider using MyChart. We now offer e-Visits for anyone 36 and older to request care online for non-urgent symptoms. For details visit [email or web address]. ?  ?Also download the MyChart app! Go to the app store, search "MyChart", open the app, select [facility], and log in with your MyChart username and password. ? ?Due to Covid, a mask is required upon entering the hospital/clinic. If you do not have a mask, one will be given to you upon arrival. For doctor visits, patients may have 1 support person aged 35 or older with them. For treatment visits, patients cannot have anyone with them due to current Covid guidelines and our immunocompromised population.  ? ?

## 2021-07-16 ENCOUNTER — Telehealth: Payer: Self-pay | Admitting: Hematology

## 2021-07-16 NOTE — Telephone Encounter (Signed)
Scheduled follow-up appointments per 3/8 los. Patient is aware. ?

## 2021-07-22 ENCOUNTER — Encounter: Payer: Self-pay | Admitting: Hematology

## 2021-07-22 NOTE — Progress Notes (Addendum)
?  ? ? ?HEMATOLOGY/ONCOLOGY CLINIC NOTE ? ?Date of Service: 07/15/2021 ? ? ? ?Patient Care Team: ?[name], MD as PCP - General (Family Medicine)  ? ?CHIEF COMPLAINTS/PURPOSE OF CONSULTATION:  ?Follow-up for continued management of metastatic poorly differentiated lung adenocarcinoma and PET/CT results. ? ?HISTORY OF PRESENTING ILLNESS:  ?Please see previous notes for details on initial presentation. ? ?Current Treatment:  ?Atezolizumab maintenance  ? ?INTERVAL HISTORY:  ? ?David Blakes. is here for continued evaluation and management of his metastatic lung adenocarcinoma. ?Since his last clinic visit we discussed his case with interventional radiology and a biopsy of the adrenal nodule was not considered possible technically since it was very small. ?I discussed different approaches with the patient and we made a shared decision to continue immunotherapy and closely monitor for any additional signs of progression with interval imaging. ?He has no new symptoms since his last clinic visit. ?Continues to eat well. ?Still having some intermittent hemorrhoidal bleeding.  Does have an appointment with GI to try to address his hemorrhoids. ?No other acute new symptoms. ?Labs done today reviewed with him. ? ?MEDICAL HISTORY:  ?Past Medical History:  ?Diagnosis Date  ? Anxiety   ? Bipolar disorder ([place])   ? Blood in stool   ? Cancer [facility])   ? 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 pt denies this  ? GERD (gastroesophageal reflux disease)   ? Lung cancer ([place]) dx'd 01/2019  ? Peripheral vascular disease ([place])   ? blood clot in neck Sept 12, 2020  ? ? ?SURGICAL HISTORY: ?Past Surgical History:  ?Procedure Laterality Date  ? APPENDECTOMY    ? teenager  ? INGUINAL HERNIA REPAIR Right 2016, 2017  ? [place], 2018 [facility]-removed mesh  ? IR IMAGING GUIDED PORT INSERTION  04/26/2019  ? TOOTH EXTRACTION N/A 03/14/2020  ? Procedure: DENTAL RESTORATION/EXTRACTIONS;  Surgeon: Diona Browner, DDS;  Location:  [facility];  Service: Oral Surgery;  Laterality: N/A;  ? ? ?SOCIAL HISTORY: ?Social History  ? ?Socioeconomic History  ? Marital status: Single  ?  Spouse name: Not on file  ? Number of children: 1  ? Years of education: GED  ? Highest education level: Some college, no degree  ?Occupational History  ?  Comment: fork lift driver  ?Tobacco Use  ? Smoking status: Former  ?  Types: Cigars  ?  Quit date: 01/20/2019  ?  Years since quitting: 2.5  ? Smokeless tobacco: Never  ? Tobacco comments:  ?  smokes 3 black and mild cigars per day  ?Substance and Sexual Activity  ? Alcohol use: Not Currently  ? Drug use: No  ? Sexual activity: Yes  ?Other Topics Concern  ? Not on file  ?Social History Narrative  ? Lives alone  ? Caffeine- coffee, 20 oz daily, tea occas  ? ?Social Determinants of Health  ? ?Financial Resource Strain: Low Risk   ? Difficulty of Paying Living Expenses: Not very hard  ?Food Insecurity: No Food Insecurity  ? Worried About Charity fundraiser in the Last Year: Never true  ? Ran Out of Food in the Last Year: Never true  ?Transportation Needs: No Transportation Needs  ? Lack of Transportation (Medical): No  ? Lack of Transportation (Non-Medical): No  ?Physical Activity: Sufficiently Active  ? Days of Exercise per Week: 3 days  ? Minutes of Exercise per Session: 150+ min  ?Stress: No Stress Concern Present  ? Feeling of Stress : Not at  all  ?Social Connections: Socially Isolated  ? Frequency of Communication with Friends and Family: More than three times a week  ? Frequency of Social Gatherings with Friends and Family: Once a week  ? Attends Religious Services: Never  ? Active Member of Clubs or Organizations: No  ? Attends Archivist Meetings: Not on file  ? Marital Status: Never married  ?Intimate Partner Violence: Not on file  ? ? ?FAMILY HISTORY: ?Family History  ?Problem Relation Age of Onset  ? Prostate cancer Father   ? ? ?ALLERGIES:  has no active allergies. ? ?MEDICATIONS:  ?Current Outpatient  Medications  ?Medication Sig Dispense Refill  ? apixaban (ELIQUIS) 5 MG TABS tablet Take 1 tablet (5 mg total) by mouth 2 (two) times daily. 60 tablet 11  ? Cyanocobalamin (VITAMIN B 12 PO) Take 2,500 mcg by mouth daily.    ? esomeprazole (NEXIUM) 40 MG capsule Take 1 capsule (40 mg total) by mouth daily. 90 capsule 2  ? lidocaine-prilocaine (EMLA) cream Apply 1 application topically as needed. 30 g 0  ? phenylephrine (,USE FOR PREPARATION-H,) 0.25 % suppository Place 1 suppository rectally 2 (two) times daily as needed for hemorrhoids (for bleeding hemorrhoids. Upto 4 doses in 2 days max.). 12 suppository 0  ? pregabalin (LYRICA) 50 MG capsule Take 1 capsule (50 mg total) by mouth 2 (two) times daily. 60 capsule 3  ? propranolol (INDERAL) 20 MG tablet Take 1 tablet (20 mg total) by mouth 2 (two) times daily. 60 tablet 5  ? sildenafil (VIAGRA) 50 MG tablet Take 1 tablet (50 mg total) by mouth daily as needed for erectile dysfunction. Take 30 mins to 4 hours prior to sexual activity 20 tablet 1  ? ?No current facility-administered medications for this visit.  ? ?REVIEW OF SYSTEMS:   ?10 Point review of Systems was done is negative except as noted above. ? ?PHYSICAL EXAMINATION: ?ECOG FS:1 - Symptomatic but completely ambulatory ? ?Vitals:  ? 07/15/21 1307  ?BP: 98/62  ?Pulse: 77  ?Resp: 16  ?Temp: 98.1 ?F (36.7 ?C)  ?SpO2: 99%  ? ? ? ?Wt Readings from Last 3 Encounters:  ?07/15/21 137 lb 8 oz (62.4 kg)  ?06/23/21 139 lb 4.8 oz (63.2 kg)  ?05/27/21 138 lb (62.6 kg)  ? ?Body mass index is 18.91 kg/m?[name]   ?NAD ?GENERAL:alert, in no acute distress and comfortable ?SKIN: no acute rashes, no significant lesions ?EYES: conjunctiva are pink and non-injected, sclera anicteric ?OROPHARYNX: MMM, no exudates, no oropharyngeal erythema or ulceration ?NECK: supple, no JVD ?LYMPH:  no palpable lymphadenopathy in the cervical, axillary or inguinal regions ?LUNGS: clear to auscultation b/l with normal respiratory effort ?HEART:  regular rate & rhythm ?ABDOMEN:  normoactive bowel sounds , non tender, not distended. ?Extremity: no pedal edema ?PSYCH: alert & oriented x 3 with fluent speech ?NEURO: no focal motor/sensory deficits ? ? ?LABORATORY DATA:  ?I have reviewed the data as listed ? ?. ?CBC Latest Ref Rng & Units 07/15/2021 06/23/2021 05/27/2021  ?WBC 4.0 - 10.5 K/uL 3.8(L) 4.8 3.9(L)  ?Hemoglobin 13.0 - 17.0 g/dL 11.5(L) 11.9(L) 11.8(L)  ?Hematocrit 39.0 - 52.0 % 36.9(L) 37.5(L) 38.2(L)  ?Platelets 150 - 400 K/uL 319 243 246  ? ? ?. ?CMP Latest Ref Rng & Units 07/15/2021 06/23/2021 05/27/2021  ?Glucose 70 - 99 mg/dL 108(H) 111(H) 103(H)  ?BUN 6 - 20 mg/dL '8 12 9  ' ?Creatinine 0.61 - 1.24 mg/dL 0.85 0.88 0.85  ?Sodium 135 - 145 mmol/L 137 138 137  ?  Potassium 3.5 - 5.1 mmol/L 4.5 4.2 4.1  ?Chloride 98 - 111 mmol/L 105 105 103  ?CO2 22 - 32 mmol/L '29 28 30  ' ?Calcium 8.9 - 10.3 mg/dL 9.7 9.8 9.7  ?Total Protein 6.5 - 8.1 g/dL 6.4(L) 7.0 7.1  ?Total Bilirubin 0.3 - 1.2 mg/dL 0.4 0.4 0.3  ?Alkaline Phos 38 - 126 U/L 98 93 88  ?AST 15 - 41 U/L 18 16 14(L)  ?ALT 0 - 44 U/L '13 13 10  ' ? ? ?01/22/2019 [facility]: Tumor Mutational Burden  ? ? ? ?01/22/2019 PD-L1 Immunohistochemistry Analysis  ? ? ?01/22/2019 Soft Tissue Needle Core Biopsy Surgical Pathology  ? ? ? ?RADIOGRAPHIC STUDIES: ?I have personally reviewed the radiological images as listed and agreed with the findings in the report. ?No results found. ? ?ASSESSMENT & PLAN:  ? ?This is a 56 year old male with ?  ?1. Metastatic Poorly differentiated lung adenocarcinoma ?Presented with Large neck mass, mediastinal mass, renal mass, and questionable bone lesions ? no brain mets on MRI brain ?01/22/2019 PD-L1 Immunohistochemistry Analysis which revealed "Tumor Proportion Score (TPS) 50%" ?05/16/2019 PET/CT ([id number]) revealed "Radiation changes in the right hemithorax, as above. Improving mediastinal nodal metastases. Prior bulky right cervical metastases have resolved. No evidence of metastatic  disease in the abdomen/pelvis." ?07/19/2019 Esophagus Scan ([id number]) revealed "Mild stricture at the C6-7 level likely related to prior esophageal surgery. Barium tablet was slow to pass through this area

## 2021-07-24 ENCOUNTER — Other Ambulatory Visit: Payer: Self-pay | Admitting: Hematology

## 2021-07-28 ENCOUNTER — Telehealth: Payer: Self-pay | Admitting: Gastroenterology

## 2021-07-28 NOTE — Telephone Encounter (Signed)
Pt came into the office thinking that his appt was today but it is for tomorrow. Pt stated to have drank something that he was given ( I believe it was a contrast). He stated to have given instructions to take it before his appt. But according to Epic he is scheduled for an ov. Please call pt to clarify this.  ?

## 2021-07-28 NOTE — Telephone Encounter (Signed)
Returned pt call. Admits he confused his CT appt that had gotten canceled with his NP appt with Dr. Tarri Glenn that will take place tomorrow 3/22). States he went ahead and drank oral contrast thinking it was in preparation for his appt with Dr. Tarri Glenn. Educated, there is no prep required for a NP appt with Dr. Tarri Glenn, except to arrive hydrated and with NP paperwork completed. Also advised he contact the office that intended for him to have the CT so they are aware of the confusion. Advised he will likely experience loose stools following intake of contrast. Only recommendation provided was to remain fully hydrated to ensure contrast is flushed from system AND to notify provide of his error. Verbalized acceptance and understanding. Routing to Dr. Tarri Glenn as Juluis Rainier ?

## 2021-07-29 ENCOUNTER — Ambulatory Visit (INDEPENDENT_AMBULATORY_CARE_PROVIDER_SITE_OTHER): Payer: Medicare Other | Admitting: Gastroenterology

## 2021-07-29 ENCOUNTER — Encounter: Payer: Self-pay | Admitting: Gastroenterology

## 2021-07-29 VITALS — BP 104/62 | HR 67 | Ht 71.0 in | Wt 134.0 lb

## 2021-07-29 DIAGNOSIS — K625 Hemorrhage of anus and rectum: Secondary | ICD-10-CM | POA: Diagnosis not present

## 2021-07-29 DIAGNOSIS — K648 Other hemorrhoids: Secondary | ICD-10-CM

## 2021-07-29 MED ORDER — HYDROCORTISONE ACETATE 25 MG RE SUPP
25.0000 mg | Freq: Two times a day (BID) | RECTAL | 11 refills | Status: DC
Start: 2021-07-29 — End: 2021-09-01

## 2021-07-29 NOTE — Telephone Encounter (Signed)
Thanks for your help.

## 2021-07-29 NOTE — Patient Instructions (Addendum)
It was my pleasure to provide care to you today. Based on our discussion, I am providing you with my recommendations below: ? ?RECOMMENDATION(S):  ? ?High fiber diet recommended. ? ?Please drink at least 1.5-2 liters of water every day. ? ?I recommend adding a daily stool bulking agent with psyllium or methylcellulose. This could be Benefiber or Metamucil.  ? ?I have prescribed  Anusol HC 2.5% on a suppositories twice daily.  ? ?Sitz Baths may provide some additional relief. ? ?If you would like more information, [email or web address] and [email or web address] have good information about hemorrhoids.  ? ?PRESCRIPTION MEDICATION(S):  ? ?We have sent the following medication(s) to your pharmacy: ? ?Hydrocortisone suppositories twice daily for 14 days. ? ?NOTE: If your medication(s) requires a PRIOR AUTHORIZATION, we will receive notification from your pharmacy. Once received, the process to submit for approval may take up to 7-10 business days. You will be contacted about any denials we have received from your insurance company as well as alternatives recommended by your provider. ? ? ?FOLLOW UP: ? ?I would like for you to follow up with me in 3-4 weeks, earlier if needed. Please call the office at [phone] to schedule your appointment. ? ?BMI: ? ?If you are age 19 or older, your body mass index should be between 23-30. Your Body mass index is 18.69 kg/m?[name] If this is out of the aforementioned range listed, please consider follow up with your Primary Care Provider. ? ?If you are age 40 or younger, your body mass index should be between 19-25. Your Body mass index is 18.69 kg/m?[name] If this is out of the aformentioned range listed, please consider follow up with your Primary Care Provider.  ? ?MY CHART: ? ?The [facility] providers would like to encourage you to use [facility] to communicate with providers for non-urgent requests or questions.  Due to long hold times on the telephone, sending your provider a message by [facility] may be a  faster and more efficient way to get a response.  Please allow 48 business hours for a response.  Please remember that this is for non-urgent requests.  ? ?Thank you for trusting me with your gastrointestinal care!   ? ?[place], MD, MPH ? ?

## 2021-07-29 NOTE — Progress Notes (Signed)
? ?Referring Provider: [name], MD ?Primary Care Physician:  [name], MD ? ? ?Chief complaint: Rectal bleeding ? ? ?IMPRESSION:  ?Rectal bleeding: Internal hemorrhoids seen on recent colonoscopy. No other cause seen on exam today. Will treat with local therapy. Consider hemorrhoidal banding if bleeding persists.  ? ?Longstanding GERD with LA Class D reflux esophagitis on EGD 2021. Symptoms well controlled on daily PPI.   ? ? ?PLAN: ?- Continue esomeprazole 40 mg QD (3 months with 3 refills) ?- High fiber diet recommended. ?- Please drink at least 1.5-2 liters of water every day. ?- I recommend adding a daily stool bulking agent with psyllium or methylcellulose. ?- Anusol HC 2.5% suppositories twice daily.  ?- Sitz Baths may provide some additional relief. ?- If you would like more information, [email or web address] and [email or web address] have good information about hemorrhoids.  ?- Office follow-up in 3-4 weeks. Consider banding if bleeding persists despite local therapies.  ? ? ?Today's encounter was over 30 minutes which included precharting, chart/result review, history/exam, formulating a treatment plan and documentation.  ? ? ?HPI: David Berry. is a 56 y.o. male returns today with concerns about hemorrhoids. He has previously been seen for reflux and an esophageal stricture , most recently 02/06/21 when his symptoms were well controled on omeprazole 40 mg BID.  ? ?He had history of esophageal surgery as a child, reporting being in and out of the hospital many times between the ages of 73 and 76 years old for esophageal problems.  However he did not have major swallowing problems for many years.  He learned to compensate by chewing well and eating slowly.   ? ?He has metastatic poorly differentiated adenocarcinoma of the lung presenting with a large neck mass, mediastinal mass, renal mass, and possible bone lesions.  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  He is currently receiving treatment with atezolizumab.  ? ?He presents today reporting rectal bleeding after every bowel movement.  There is associated rectal discomfort but no pain. No straining.  No diarrhea or constipation.  Some improvement with Preparation H suppositories. He would rather take something orally.  ? ?Endoscopic history: ?- Screening colonoscopy 07/09/2019 showed internal hemorrhoids and was otherwise normal ?- EGD for reflux 07/09/2019 showed LA class D reflux esophagitis, benign-appearing stenosis located 40 cm from the incisors that could not be traversed due to resistance.  A small rent developed with attempted endoscopic evaluation. ? ?Abdominal imaging: ?- Barium swallow 07/19/2019 showed a mild stricture at C6-7, successful but slow passage of a barium tablet through this area, hiatal hernia with moderate reflux, and a moderate stricture above the hiatal hernia ?- CT of the chest, abdomen, and pelvis 11/08/2020 showed generalized mild smooth wall thickening throughout the thoracic esophagus unchanged.  Right paratracheal soft tissue enlargement.  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  No new or progressive metastatic disease. ? ? ? ?Past Medical History:  ?Diagnosis Date  ? Anxiety   ? Bipolar disorder ([place])   ? Blood in stool   ? Cancer [facility])   ? 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 pt denies this  ? GERD (gastroesophageal reflux disease)   ? Lung cancer ([place]) dx'd 01/2019  ? Peripheral vascular disease ([place])   ? blood clot in neck Sept  12, 2020  ? ? ?Past Surgical History:  ?Procedure Laterality Date  ? APPENDECTOMY    ? teenager  ? INGUINAL HERNIA REPAIR Right 2016, 2017  ? [place], [facility]-removed mesh  ? IR IMAGING GUIDED PORT INSERTION  04/26/2019  ? TOOTH EXTRACTION N/A 03/14/2020  ?  Procedure: DENTAL RESTORATION/EXTRACTIONS;  Surgeon: Diona Browner, DDS;  Location: [facility];  Service: Oral Surgery;  Laterality: N/A;  ? ? ?Current Outpatient Medications  ?Medication Sig Dispense Refill  ? Cyanocobalamin (VITAMIN B 12 PO) Take 2,500 mcg by mouth daily.    ? ELIQUIS 5 MG TABS tablet TAKE 1 TABLET(5 MG) BY MOUTH TWICE DAILY 60 tablet 11  ? esomeprazole (NEXIUM) 40 MG capsule Take 1 capsule (40 mg total) by mouth daily. 90 capsule 2  ? lidocaine-prilocaine (EMLA) cream Apply 1 application topically as needed. 30 g 0  ? propranolol (INDERAL) 20 MG tablet Take 1 tablet (20 mg total) by mouth 2 (two) times daily. 60 tablet 5  ? sildenafil (VIAGRA) 50 MG tablet Take 1 tablet (50 mg total) by mouth daily as needed for erectile dysfunction. Take 30 mins to 4 hours prior to sexual activity 20 tablet 1  ? ?No current facility-administered medications for this visit.  ? ? ?Allergies as of 07/29/2021  ? (No Active Allergies)  ? ? ?Family History  ?Problem Relation Age of Onset  ? Prostate cancer Father   ? ? ? ?Physical Exam: ?General:   Alert,  well-nourished, pleasant and cooperative in NAD ?Head:  Normocephalic and atraumatic. ?Eyes:  Sclera clear, no icterus.   Conjunctiva pink. ?Abdomen:  Soft, thin, muscular, nontender, nondistended, normal bowel sounds, no rebound or guarding. No hepatosplenomegaly.   ?Rectum:  No chemical dermatitis. No external hemorrhoids, fissure or fistula. No prolapsing hemorrhoids. No rectal prolapse. Normal anocutaneous reflex. No stool in the rectal vault. No mass or fecal impaction. Palpable internal hemorrhoids present. Normal anal resting tone if not hypertense.  Chaperone: Heather ?Extremities:  No clubbing or edema. ?Neurologic:  Alert and  oriented x4;  grossly nonfocal ?Skin:  Intact without significant lesions or rashes. ?Psych:  Alert and cooperative. Normal mood and affect. ? ? ? ? ?Kiyona Mcnall L. Tarri Glenn, MD, MPH ?07/29/2021, 8:51 AM ? ? ? ?  ?

## 2021-08-04 ENCOUNTER — Ambulatory Visit: Payer: Medicaid Other

## 2021-08-04 ENCOUNTER — Ambulatory Visit: Payer: Medicaid Other | Admitting: Hematology

## 2021-08-04 ENCOUNTER — Other Ambulatory Visit: Payer: Medicaid Other

## 2021-08-05 ENCOUNTER — Inpatient Hospital Stay: Payer: Medicare Other

## 2021-08-05 ENCOUNTER — Other Ambulatory Visit: Payer: Self-pay

## 2021-08-05 ENCOUNTER — Inpatient Hospital Stay (HOSPITAL_BASED_OUTPATIENT_CLINIC_OR_DEPARTMENT_OTHER): Payer: Medicare Other | Admitting: Hematology

## 2021-08-05 VITALS — BP 104/82 | HR 65 | Temp 97.0°F | Resp 20 | Wt 137.1 lb

## 2021-08-05 DIAGNOSIS — C3491 Malignant neoplasm of unspecified part of right bronchus or lung: Secondary | ICD-10-CM

## 2021-08-05 DIAGNOSIS — Z5112 Encounter for antineoplastic immunotherapy: Secondary | ICD-10-CM

## 2021-08-05 DIAGNOSIS — Z79899 Other long term (current) drug therapy: Secondary | ICD-10-CM | POA: Diagnosis not present

## 2021-08-05 DIAGNOSIS — Z86718 Personal history of other venous thrombosis and embolism: Secondary | ICD-10-CM | POA: Diagnosis not present

## 2021-08-05 DIAGNOSIS — Z95828 Presence of other vascular implants and grafts: Secondary | ICD-10-CM

## 2021-08-05 DIAGNOSIS — D649 Anemia, unspecified: Secondary | ICD-10-CM | POA: Diagnosis not present

## 2021-08-05 DIAGNOSIS — C771 Secondary and unspecified malignant neoplasm of intrathoracic lymph nodes: Secondary | ICD-10-CM | POA: Diagnosis not present

## 2021-08-05 DIAGNOSIS — Z7901 Long term (current) use of anticoagulants: Secondary | ICD-10-CM | POA: Diagnosis not present

## 2021-08-05 DIAGNOSIS — Z87891 Personal history of nicotine dependence: Secondary | ICD-10-CM | POA: Diagnosis not present

## 2021-08-05 DIAGNOSIS — Z7189 Other specified counseling: Secondary | ICD-10-CM

## 2021-08-05 LAB — CMP (CANCER CENTER ONLY)
ALT: 10 U/L (ref 0–44)
AST: 14 U/L — ABNORMAL LOW (ref 15–41)
Albumin: 4 g/dL (ref 3.5–5.0)
Alkaline Phosphatase: 91 U/L (ref 38–126)
Anion gap: 5 (ref 5–15)
BUN: 8 mg/dL (ref 6–20)
CO2: 29 mmol/L (ref 22–32)
Calcium: 9.4 mg/dL (ref 8.9–10.3)
Chloride: 106 mmol/L (ref 98–111)
Creatinine: 0.86 mg/dL (ref 0.61–1.24)
GFR, Estimated: >60 mL/min (ref >60)
Glucose, Bld: 118 mg/dL — ABNORMAL HIGH (ref 70–99)
Potassium: 3.9 mmol/L (ref 3.5–5.1)
Sodium: 140 mmol/L (ref 135–145)
Total Bilirubin: 0.3 mg/dL (ref 0.3–1.2)
Total Protein: 6.6 g/dL (ref 6.5–8.1)

## 2021-08-05 LAB — CBC WITH DIFFERENTIAL/PLATELET
Abs Immature Granulocytes: 0 10*3/uL (ref 0.00–0.07)
Basophils Absolute: 0 10*3/uL (ref 0.0–0.1)
Basophils Relative: 1 %
Eosinophils Absolute: 0 10*3/uL (ref 0.0–0.5)
Eosinophils Relative: 1 %
HCT: 37.7 % — ABNORMAL LOW (ref 39.0–52.0)
Hemoglobin: 11.9 g/dL — ABNORMAL LOW (ref 13.0–17.0)
Immature Granulocytes: 0 %
Lymphocytes Relative: 25 %
Lymphs Abs: 0.9 10*3/uL (ref 0.7–4.0)
MCH: 26.7 pg (ref 26.0–34.0)
MCHC: 31.6 g/dL (ref 30.0–36.0)
MCV: 84.5 fL (ref 80.0–100.0)
Monocytes Absolute: 0.5 10*3/uL (ref 0.1–1.0)
Monocytes Relative: 13 %
Neutro Abs: 2.2 10*3/uL (ref 1.7–7.7)
Neutrophils Relative %: 60 %
Platelets: 262 10*3/uL (ref 150–400)
RBC: 4.46 MIL/uL (ref 4.22–5.81)
RDW: 16.2 % — ABNORMAL HIGH (ref 11.5–15.5)
WBC: 3.7 10*3/uL — ABNORMAL LOW (ref 4.0–10.5)
nRBC: 0 % (ref 0.0–0.2)

## 2021-08-05 MED ORDER — FAMOTIDINE 20 MG PO TABS
20.0000 mg | ORAL_TABLET | Freq: Once | ORAL | Status: AC
  Administered 2021-08-05: 20 mg via ORAL
  Filled 2021-08-05: qty 1

## 2021-08-05 MED ORDER — SODIUM CHLORIDE 0.9% FLUSH
10.0000 mL | INTRAVENOUS | Status: DC | PRN
  Administered 2021-08-05: 10 mL

## 2021-08-05 MED ORDER — SODIUM CHLORIDE 0.9 % IV SOLN
1200.0000 mg | Freq: Once | INTRAVENOUS | Status: AC
  Administered 2021-08-05: 1200 mg via INTRAVENOUS
  Filled 2021-08-05: qty 20

## 2021-08-05 MED ORDER — HEPARIN SOD (PORK) LOCK FLUSH 100 UNIT/ML IV SOLN
500.0000 [IU] | Freq: Once | INTRAVENOUS | Status: AC | PRN
  Administered 2021-08-05: 500 [IU]

## 2021-08-05 MED ORDER — SODIUM CHLORIDE 0.9 % IV SOLN: Freq: Once | INTRAVENOUS | Status: AC

## 2021-08-05 MED ORDER — DIPHENHYDRAMINE HCL 25 MG PO CAPS
25.0000 mg | ORAL_CAPSULE | Freq: Once | ORAL | Status: AC
  Administered 2021-08-05: 25 mg via ORAL
  Filled 2021-08-05: qty 1

## 2021-08-05 MED ORDER — ACETAMINOPHEN 325 MG PO TABS
650.0000 mg | ORAL_TABLET | Freq: Once | ORAL | Status: AC
  Administered 2021-08-05: 650 mg via ORAL
  Filled 2021-08-05: qty 2

## 2021-08-05 NOTE — Patient Instructions (Signed)
[facility]   ?Discharge Instructions: ?Thank you for choosing [facility] to provide your oncology and hematology care.  ? ?If you have a lab appointment with the [facility], please go directly to the [facility] and check in at the registration area. ?  ?Wear comfortable clothing and clothing appropriate for easy access to any Portacath or PICC line.  ? ?We strive to give you quality time with your provider. You may need to reschedule your appointment if you arrive late (15 or more minutes).  Arriving late affects you and other patients whose appointments are after yours.  Also, if you miss three or more appointments without notifying the office, you may be dismissed from the clinic at the provider?s discretion.    ?  ?For prescription refill requests, have your pharmacy contact our office and allow 72 hours for refills to be completed.   ? ?Today you received the following chemotherapy and/or immunotherapy agents: atezolizumab    ?  ?To help prevent nausea and vomiting after your treatment, we encourage you to take your nausea medication as directed. ? ?BELOW ARE SYMPTOMS THAT SHOULD BE REPORTED IMMEDIATELY: ?*FEVER GREATER THAN 100.4 F (38 ?C) OR HIGHER ?*CHILLS OR SWEATING ?*NAUSEA AND VOMITING THAT IS NOT CONTROLLED WITH YOUR NAUSEA MEDICATION ?*UNUSUAL SHORTNESS OF BREATH ?*UNUSUAL BRUISING OR BLEEDING ?*URINARY PROBLEMS (pain or burning when urinating, or frequent urination) ?*BOWEL PROBLEMS (unusual diarrhea, constipation, pain near the anus) ?TENDERNESS IN MOUTH AND THROAT WITH OR WITHOUT PRESENCE OF ULCERS (sore throat, sores in mouth, or a toothache) ?UNUSUAL RASH, SWELLING OR PAIN  ?UNUSUAL VAGINAL DISCHARGE OR ITCHING  ? ?Items with * indicate a potential emergency and should be followed up as soon as possible or go to the Emergency Department if any problems should occur. ? ?Please show the CHEMOTHERAPY ALERT CARD or IMMUNOTHERAPY ALERT CARD at  check-in to the Emergency Department and triage nurse. ? ?Should you have questions after your visit or need to cancel or reschedule your appointment, please contact [facility]  Dept: [phone]  and follow the prompts.  Office hours are 8:00 a.m. to 4:30 p.m. Monday - Friday. Please note that voicemails left after 4:00 p.m. may not be returned until the following business day.  We are closed weekends and major holidays. You have access to a nurse at all times for urgent questions. Please call the main number to the clinic Dept: [phone] and follow the prompts. ? ? ?For any non-urgent questions, you may also contact your provider using MyChart. We now offer e-Visits for anyone 14 and older to request care online for non-urgent symptoms. For details visit [email or web address]. ?  ?Also download the MyChart app! Go to the app store, search "MyChart", open the app, select [facility], and log in with your MyChart username and password. ? ?Due to Covid, a mask is required upon entering the hospital/clinic. If you do not have a mask, one will be given to you upon arrival. For doctor visits, patients may have 1 support person aged 61 or older with them. For treatment visits, patients cannot have anyone with them due to current Covid guidelines and our immunocompromised population.  ? ?

## 2021-08-05 NOTE — Progress Notes (Signed)
?  ? ? ?HEMATOLOGY/ONCOLOGY CLINIC NOTE ? ?Date of Service: 08/05/2021 ? ? ? ?Patient Care Team: ?[name], MD as PCP - General (Family Medicine)  ? ?CHIEF COMPLAINTS/PURPOSE OF CONSULTATION:  ?Follow-up for continued valuation management of metastatic lung adenocarcinoma ? ?HISTORY OF PRESENTING ILLNESS:  ?Please see previous notes for details on initial presentation. ? ?Current Treatment:  ?Atezolizumab maintenance  ? ?INTERVAL HISTORY:  ? ?David Blakes. is here for continued evaluation and management of his metastatic lung adenocarcinoma. ?He notes no acute new symptoms since his last clinic visit. ?Good p.o. intake.  No new fatigue. ?No new chest pain or shortness of breath. ?No new abdominal pain or distention. ?Patient notes no new side effects from his current immunotherapy.  No rashes no diarrhea no other acute new focal symptoms. ?Labs done today reviewed with the patient. ? ?MEDICAL HISTORY:  ?Past Medical History:  ?Diagnosis Date  ? Anxiety   ? Bipolar disorder ([place])   ? Blood in stool   ? Cancer [facility])   ? 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 pt denies this  ? GERD (gastroesophageal reflux disease)   ? Lung cancer ([place]) dx'd 01/2019  ? Peripheral vascular disease ([place])   ? blood clot in neck Sept 12, 2020  ? ? ?SURGICAL HISTORY: ?Past Surgical History:  ?Procedure Laterality Date  ? APPENDECTOMY    ? teenager  ? INGUINAL HERNIA REPAIR Right 2016, 2017  ? [place], [facility]-removed mesh  ? IR IMAGING GUIDED PORT INSERTION  04/26/2019  ? TOOTH EXTRACTION N/A 03/14/2020  ? Procedure: DENTAL RESTORATION/EXTRACTIONS;  Surgeon: Diona Browner, DDS;  Location: [facility];  Service: Oral Surgery;  Laterality: N/A;  ? ? ?SOCIAL HISTORY: ?Social History  ? ?Socioeconomic History  ? Marital status: Single  ?  Spouse name: Not on file  ? Number of children: 1  ? Years of education: GED  ? Highest education level: Some college, no degree  ?Occupational History  ?  Comment: fork lift  driver  ?Tobacco Use  ? Smoking status: Former  ?  Types: Cigars  ?  Quit date: 01/20/2019  ?  Years since quitting: 2.5  ? Smokeless tobacco: Never  ? Tobacco comments:  ?  smokes 3 black and mild cigars per day  ?Substance and Sexual Activity  ? Alcohol use: Not Currently  ? Drug use: No  ? Sexual activity: Yes  ?Other Topics Concern  ? Not on file  ?Social History Narrative  ? Lives alone  ? Caffeine- coffee, 20 oz daily, tea occas  ? ?Social Determinants of Health  ? ?Financial Resource Strain: Low Risk   ? Difficulty of Paying Living Expenses: Not very hard  ?Food Insecurity: No Food Insecurity  ? Worried About Charity fundraiser in the Last Year: Never true  ? Ran Out of Food in the Last Year: Never true  ?Transportation Needs: No Transportation Needs  ? Lack of Transportation (Medical): No  ? Lack of Transportation (Non-Medical): No  ?Physical Activity: Sufficiently Active  ? Days of Exercise per Week: 3 days  ? Minutes of Exercise per Session: 150+ min  ?Stress: No Stress Concern Present  ? Feeling of Stress : Not at all  ?Social Connections: Socially Isolated  ? Frequency of Communication with Friends and Family: More than three times a week  ? Frequency of Social Gatherings with Friends and Family: Once a week  ? Attends Religious Services: Never  ? Active Member of  Clubs or Organizations: No  ? Attends Archivist Meetings: Not on file  ? Marital Status: Never married  ?Intimate Partner Violence: Not on file  ? ? ?FAMILY HISTORY: ?Family History  ?Problem Relation Age of Onset  ? Prostate cancer Father   ? ? ?ALLERGIES:  has no active allergies. ? ?MEDICATIONS:  ?Current Outpatient Medications  ?Medication Sig Dispense Refill  ? Cyanocobalamin (VITAMIN B 12 PO) Take 2,500 mcg by mouth daily.    ? ELIQUIS 5 MG TABS tablet TAKE 1 TABLET(5 MG) BY MOUTH TWICE DAILY 60 tablet 11  ? esomeprazole (NEXIUM) 40 MG capsule Take 1 capsule (40 mg total) by mouth daily. 90 capsule 2  ? hydrocortisone  (ANUSOL-HC) 25 MG suppository Place 1 suppository (25 mg total) rectally 2 (two) times daily. 28 suppository 11  ? lidocaine-prilocaine (EMLA) cream Apply 1 application topically as needed. 30 g 0  ? propranolol (INDERAL) 20 MG tablet Take 1 tablet (20 mg total) by mouth 2 (two) times daily. 60 tablet 5  ? sildenafil (VIAGRA) 50 MG tablet Take 1 tablet (50 mg total) by mouth daily as needed for erectile dysfunction. Take 30 mins to 4 hours prior to sexual activity 20 tablet 1  ? ?No current facility-administered medications for this visit.  ? ?Facility-Administered Medications Ordered in Other Visits  ?Medication Dose Route Frequency Provider Last Rate Last Admin  ? atezolizumab (TECENTRIQ) 1,200 mg in sodium chloride 0.9 % 250 mL chemo infusion  1,200 mg Intravenous Once Brunetta Genera, MD      ? heparin lock flush 100 unit/mL  500 Units Intracatheter Once PRN Brunetta Genera, MD      ? sodium chloride flush (NS) 0.9 % injection 10 mL  10 mL Intracatheter PRN Brunetta Genera, MD      ? ?REVIEW OF SYSTEMS:   ?10 Point review of Systems was done is negative except as noted above. ? ?PHYSICAL EXAMINATION: ?ECOG FS:1 - Symptomatic but completely ambulatory ? ?Vitals:  ? 08/05/21 0915  ?BP: 104/82  ?Pulse: 65  ?Resp: 20  ?Temp: (!) 97 ?F (36.1 ?C)  ?SpO2: 100%  ? ? ? ?Wt Readings from Last 3 Encounters:  ?08/05/21 137 lb 1.6 oz (62.2 kg)  ?07/29/21 134 lb (60.8 kg)  ?07/15/21 137 lb 8 oz (62.4 kg)  ? ?Body mass index is 19.12 kg/m?[name]   ?NAD ?GENERAL:alert, in no acute distress and comfortable ?SKIN: no acute rashes, no significant lesions ?NECK: supple, no JVD ?LYMPH:  no palpable lymphadenopathy in the cervical, axillary or inguinal regions ?LUNGS: clear to auscultation b/l with normal respiratory effort ?HEART: regular rate & rhythm ?ABDOMEN:  normoactive bowel sounds , non tender, not distended. ?Extremity: no pedal edema ?PSYCH: alert & oriented x 3 with fluent speech ?NEURO: no focal motor/sensory  deficits ? ? ? ?LABORATORY DATA:  ?I have reviewed the data as listed ? ?. ? ?  Latest Ref Rng & Units 08/05/2021  ?  8:56 AM 07/15/2021  ? 12:51 PM 06/23/2021  ?  3:11 PM  ?CBC  ?WBC 4.0 - 10.5 K/uL 3.7   3.8   4.8    ?Hemoglobin 13.0 - 17.0 g/dL 11.9   11.5   11.9    ?Hematocrit 39.0 - 52.0 % 37.7   36.9   37.5    ?Platelets 150 - 400 K/uL 262   319   243    ? ? ?. ? ?  Latest Ref Rng & Units 08/05/2021  ?  8:56  AM 07/15/2021  ? 12:51 PM 06/23/2021  ?  3:11 PM  ?CMP  ?Glucose 70 - 99 mg/dL 118   108   111    ?BUN 6 - 20 mg/dL _0 ?Creatinine 0.61 - 1.24 mg/dL 0.86   0.85   0.88    ?Sodium 135 - 145 mmol/L 140   137   138    ?Potassium 3.5 - 5.1 mmol/L 3.9   4.5   4.2    ?Chloride 98 - 111 mmol/L 106   105   105    ?CO2 22 - 32 mmol/L _1 ?Calcium 8.9 - 10.3 mg/dL 9.4   9.7   9.8    ?Total Protein 6.5 - 8.1 g/dL 6.6   6.4   7.0    ?Total Bilirubin 0.3 - 1.2 mg/dL 0.3   0.4   0.4    ?Alkaline Phos 38 - 126 U/L 91   98   93    ?AST 15 - 41 U/L _2 ?ALT 0 - 44 U/L _3 ? ?Marland KitchenNo results found for: LDH ? ?01/22/2019 [facility]: Tumor Mutational Burden  ? ? ? ?01/22/2019 PD-L1 Immunohistochemistry Analysis  ? ? ?01/22/2019 Soft Tissue Needle Core Biopsy Surgical Pathology  ? ? ? ?RADIOGRAPHIC STUDIES: ?I have personally reviewed the radiological images as listed and agreed with the findings in the report. ?No results found. ? ?ASSESSMENT & PLAN:  ? ?This is a 56 year old male with ?  ?1. Metastatic Poorly differentiated lung adenocarcinoma ?Presented with Large neck mass, mediastinal mass, renal mass, and questionable bone lesions ? no brain mets on MRI brain ?01/22/2019 PD-L1 Immunohistochemistry Analysis which revealed "Tumor Proportion Score (TPS) 50%" ?05/16/2019 PET/CT ([id number]) revealed "Radiation changes in the right hemithorax, as above. Improving mediastinal nodal metastases. Prior bulky right cervical metastases have resolved. No evidence of metastatic disease in the  abdomen/pelvis." ?07/19/2019 Esophagus Scan ([id number]) revealed "Mild stricture at the C6-7 level likely related to prior esophageal surgery. Barium tablet was slow to pass through this area but did pass through aft

## 2021-08-11 ENCOUNTER — Encounter: Payer: Self-pay | Admitting: Hematology

## 2021-08-19 ENCOUNTER — Encounter: Payer: Self-pay | Admitting: Hematology

## 2021-08-21 ENCOUNTER — Other Ambulatory Visit: Payer: Self-pay

## 2021-08-21 DIAGNOSIS — K222 Esophageal obstruction: Secondary | ICD-10-CM | POA: Diagnosis not present

## 2021-08-21 DIAGNOSIS — K219 Gastro-esophageal reflux disease without esophagitis: Secondary | ICD-10-CM | POA: Diagnosis not present

## 2021-08-21 DIAGNOSIS — Z7189 Other specified counseling: Secondary | ICD-10-CM

## 2021-08-21 DIAGNOSIS — Z0000 Encounter for general adult medical examination without abnormal findings: Secondary | ICD-10-CM | POA: Diagnosis not present

## 2021-08-21 DIAGNOSIS — C3491 Malignant neoplasm of unspecified part of right bronchus or lung: Secondary | ICD-10-CM | POA: Diagnosis not present

## 2021-08-21 DIAGNOSIS — K649 Unspecified hemorrhoids: Secondary | ICD-10-CM | POA: Diagnosis not present

## 2021-08-21 DIAGNOSIS — Z86718 Personal history of other venous thrombosis and embolism: Secondary | ICD-10-CM | POA: Diagnosis not present

## 2021-08-21 DIAGNOSIS — Z79899 Other long term (current) drug therapy: Secondary | ICD-10-CM | POA: Diagnosis not present

## 2021-08-26 ENCOUNTER — Inpatient Hospital Stay: Payer: Medicare Other | Admitting: Hematology

## 2021-08-26 ENCOUNTER — Inpatient Hospital Stay: Payer: Medicare Other

## 2021-08-26 ENCOUNTER — Inpatient Hospital Stay: Payer: Medicare Other | Attending: Physician Assistant

## 2021-08-26 ENCOUNTER — Other Ambulatory Visit: Payer: Self-pay

## 2021-08-26 VITALS — BP 118/78 | HR 70 | Temp 98.3°F | Wt 132.1 lb

## 2021-08-26 DIAGNOSIS — C771 Secondary and unspecified malignant neoplasm of intrathoracic lymph nodes: Secondary | ICD-10-CM | POA: Insufficient documentation

## 2021-08-26 DIAGNOSIS — Z7901 Long term (current) use of anticoagulants: Secondary | ICD-10-CM | POA: Insufficient documentation

## 2021-08-26 DIAGNOSIS — Z87891 Personal history of nicotine dependence: Secondary | ICD-10-CM | POA: Insufficient documentation

## 2021-08-26 DIAGNOSIS — Z86718 Personal history of other venous thrombosis and embolism: Secondary | ICD-10-CM | POA: Insufficient documentation

## 2021-08-26 DIAGNOSIS — Z7189 Other specified counseling: Secondary | ICD-10-CM

## 2021-08-26 DIAGNOSIS — Z5112 Encounter for antineoplastic immunotherapy: Secondary | ICD-10-CM | POA: Diagnosis not present

## 2021-08-26 DIAGNOSIS — Z95828 Presence of other vascular implants and grafts: Secondary | ICD-10-CM

## 2021-08-26 DIAGNOSIS — C3491 Malignant neoplasm of unspecified part of right bronchus or lung: Secondary | ICD-10-CM

## 2021-08-26 DIAGNOSIS — Z79899 Other long term (current) drug therapy: Secondary | ICD-10-CM | POA: Diagnosis not present

## 2021-08-26 DIAGNOSIS — D508 Other iron deficiency anemias: Secondary | ICD-10-CM | POA: Diagnosis not present

## 2021-08-26 LAB — CBC WITH DIFFERENTIAL/PLATELET
Abs Immature Granulocytes: 0 10*3/uL (ref 0.00–0.07)
Basophils Absolute: 0 10*3/uL (ref 0.0–0.1)
Basophils Relative: 1 %
Eosinophils Absolute: 0 10*3/uL (ref 0.0–0.5)
Eosinophils Relative: 1 %
HCT: 39.2 % (ref 39.0–52.0)
Hemoglobin: 12.6 g/dL — ABNORMAL LOW (ref 13.0–17.0)
Immature Granulocytes: 0 %
Lymphocytes Relative: 24 %
Lymphs Abs: 1 10*3/uL (ref 0.7–4.0)
MCH: 27.4 pg (ref 26.0–34.0)
MCHC: 32.1 g/dL (ref 30.0–36.0)
MCV: 85.2 fL (ref 80.0–100.0)
Monocytes Absolute: 0.7 10*3/uL (ref 0.1–1.0)
Monocytes Relative: 17 %
Neutro Abs: 2.5 10*3/uL (ref 1.7–7.7)
Neutrophils Relative %: 57 %
Platelets: 245 10*3/uL (ref 150–400)
RBC: 4.6 MIL/uL (ref 4.22–5.81)
RDW: 15.9 % — ABNORMAL HIGH (ref 11.5–15.5)
WBC: 4.3 10*3/uL (ref 4.0–10.5)
nRBC: 0 % (ref 0.0–0.2)

## 2021-08-26 LAB — CMP (CANCER CENTER ONLY)
ALT: 11 U/L (ref 0–44)
AST: 15 U/L (ref 15–41)
Albumin: 4.2 g/dL (ref 3.5–5.0)
Alkaline Phosphatase: 99 U/L (ref 38–126)
Anion gap: 7 (ref 5–15)
BUN: 7 mg/dL (ref 6–20)
CO2: 28 mmol/L (ref 22–32)
Calcium: 9.7 mg/dL (ref 8.9–10.3)
Chloride: 103 mmol/L (ref 98–111)
Creatinine: 0.88 mg/dL (ref 0.61–1.24)
GFR, Estimated: >60 mL/min (ref >60)
Glucose, Bld: 129 mg/dL — ABNORMAL HIGH (ref 70–99)
Potassium: 4 mmol/L (ref 3.5–5.1)
Sodium: 138 mmol/L (ref 135–145)
Total Bilirubin: 0.4 mg/dL (ref 0.3–1.2)
Total Protein: 7.1 g/dL (ref 6.5–8.1)

## 2021-08-26 MED ORDER — SODIUM CHLORIDE 0.9% FLUSH
10.0000 mL | INTRAVENOUS | Status: DC | PRN
  Administered 2021-08-26: 10 mL

## 2021-08-26 MED ORDER — FAMOTIDINE 20 MG PO TABS
20.0000 mg | ORAL_TABLET | Freq: Once | ORAL | Status: AC
  Administered 2021-08-26: 20 mg via ORAL
  Filled 2021-08-26: qty 1

## 2021-08-26 MED ORDER — SODIUM CHLORIDE 0.9 % IV SOLN: Freq: Once | INTRAVENOUS | Status: AC

## 2021-08-26 MED ORDER — HEPARIN SOD (PORK) LOCK FLUSH 100 UNIT/ML IV SOLN
500.0000 [IU] | Freq: Once | INTRAVENOUS | Status: AC | PRN
  Administered 2021-08-26: 500 [IU]

## 2021-08-26 MED ORDER — ACETAMINOPHEN 325 MG PO TABS
650.0000 mg | ORAL_TABLET | Freq: Once | ORAL | Status: AC
  Administered 2021-08-26: 650 mg via ORAL
  Filled 2021-08-26: qty 2

## 2021-08-26 MED ORDER — DIPHENHYDRAMINE HCL 25 MG PO CAPS
25.0000 mg | ORAL_CAPSULE | Freq: Once | ORAL | Status: AC
  Administered 2021-08-26: 25 mg via ORAL
  Filled 2021-08-26: qty 1

## 2021-08-26 MED ORDER — SODIUM CHLORIDE 0.9 % IV SOLN
1200.0000 mg | Freq: Once | INTRAVENOUS | Status: AC
  Administered 2021-08-26: 1200 mg via INTRAVENOUS
  Filled 2021-08-26: qty 20

## 2021-08-26 NOTE — Patient Instructions (Signed)
[facility]  Discharge Instructions: ?Thank you for choosing [facility] to provide your oncology and hematology care.  ? ?If you have a lab appointment with the [facility], please go directly to the [facility] and check in at the registration area. ?  ?Wear comfortable clothing and clothing appropriate for easy access to any Portacath or PICC line.  ? ?We strive to give you quality time with your provider. You may need to reschedule your appointment if you arrive late (15 or more minutes).  Arriving late affects you and other patients whose appointments are after yours.  Also, if you miss three or more appointments without notifying the office, you may be dismissed from the clinic at the provider?s discretion.    ?  ?For prescription refill requests, have your pharmacy contact our office and allow 72 hours for refills to be completed.   ? ?Today you received the following chemotherapy and/or immunotherapy agents: Tecentriq    ?  ?To help prevent nausea and vomiting after your treatment, we encourage you to take your nausea medication as directed. ? ?BELOW ARE SYMPTOMS THAT SHOULD BE REPORTED IMMEDIATELY: ?*FEVER GREATER THAN 100.4 F (38 ?C) OR HIGHER ?*CHILLS OR SWEATING ?*NAUSEA AND VOMITING THAT IS NOT CONTROLLED WITH YOUR NAUSEA MEDICATION ?*UNUSUAL SHORTNESS OF BREATH ?*UNUSUAL BRUISING OR BLEEDING ?*URINARY PROBLEMS (pain or burning when urinating, or frequent urination) ?*BOWEL PROBLEMS (unusual diarrhea, constipation, pain near the anus) ?TENDERNESS IN MOUTH AND THROAT WITH OR WITHOUT PRESENCE OF ULCERS (sore throat, sores in mouth, or a toothache) ?UNUSUAL RASH, SWELLING OR PAIN  ?UNUSUAL VAGINAL DISCHARGE OR ITCHING  ? ?Items with * indicate a potential emergency and should be followed up as soon as possible or go to the Emergency Department if any problems should occur. ? ?Please show the CHEMOTHERAPY ALERT CARD or IMMUNOTHERAPY ALERT CARD at check-in to  the Emergency Department and triage nurse. ? ?Should you have questions after your visit or need to cancel or reschedule your appointment, please contact [facility]  Dept: [phone]  and follow the prompts.  Office hours are 8:00 a.m. to 4:30 p.m. Monday - Friday. Please note that voicemails left after 4:00 p.m. may not be returned until the following business day.  We are closed weekends and major holidays. You have access to a nurse at all times for urgent questions. Please call the main number to the clinic Dept: [phone] and follow the prompts. ? ? ?For any non-urgent questions, you may also contact your provider using MyChart. We now offer e-Visits for anyone 13 and older to request care online for non-urgent symptoms. For details visit [email or web address]. ?  ?Also download the MyChart app! Go to the app store, search "MyChart", open the app, select [facility], and log in with your MyChart username and password. ? ?Due to Covid, a mask is required upon entering the hospital/clinic. If you do not have a mask, one will be given to you upon arrival. For doctor visits, patients may have 1 support person aged 34 or older with them. For treatment visits, patients cannot have anyone with them due to current Covid guidelines and our immunocompromised population.  ? ?

## 2021-09-01 ENCOUNTER — Encounter: Payer: Self-pay | Admitting: Gastroenterology

## 2021-09-01 ENCOUNTER — Ambulatory Visit (INDEPENDENT_AMBULATORY_CARE_PROVIDER_SITE_OTHER): Payer: Medicare Other | Admitting: Gastroenterology

## 2021-09-01 VITALS — BP 92/60 | Ht 71.0 in | Wt 134.0 lb

## 2021-09-01 DIAGNOSIS — K648 Other hemorrhoids: Secondary | ICD-10-CM | POA: Diagnosis not present

## 2021-09-01 DIAGNOSIS — K625 Hemorrhage of anus and rectum: Secondary | ICD-10-CM

## 2021-09-01 MED ORDER — HYDROCORTISONE (PERIANAL) 2.5 % EX CREA: 1.0000 "application " | TOPICAL_CREAM | Freq: Two times a day (BID) | CUTANEOUS | 1 refills | Status: DC

## 2021-09-01 NOTE — Patient Instructions (Addendum)
It was my pleasure to provide care to you today. Based on our discussion, I am providing you with my recommendations below: ? ?RECOMMENDATION(S):  ? ?PRESCRIPTION MEDICATION(S):  ? ?We have sent the following medication(s) to your pharmacy: ? ?Hydrocortisone 2.5 % cream - Cover a Preparation H suppository two times daily for 10 days. ? ? ?NOTE: If your medication(s) requires a PRIOR AUTHORIZATION, we will receive notification from your pharmacy. Once received, the process to submit for approval may take up to 7-10 business days. You will be contacted about any denials we have received from your insurance company as well as alternatives recommended by your provider. ? ? ?FOLLOW UP: ? ?I would like for you to follow up with Dr. Lorenso Courier for hemorrhoid banding .  ? ?BMI: ? ?If you are age 15 or younger, your body mass index should be between 19-25. Your Body mass index is 18.69 kg/m?[name] If this is out of the aformentioned range listed, please consider follow up with your Primary Care Provider.  ? ?MY CHART: ? ?The [facility] providers would like to encourage you to use [facility] to communicate with providers for non-urgent requests or questions.  Due to long hold times on the telephone, sending your provider a message by [facility] may be a faster and more efficient way to get a response.  Please allow 48 business hours for a response.  Please remember that this is for non-urgent requests.  ? ?Thank you for trusting me with your gastrointestinal care!   ? ?Th[place]ton [place], MD, MPH ? ?

## 2021-09-01 NOTE — Progress Notes (Signed)
? ?Referring Provider: [name], MD ?Primary Care Physician:  [name], MD ? ? ?Chief complaint: Rectal bleeding ? ? ?IMPRESSION:  ?Rectal bleeding on Eliquis: Internal hemorrhoids seen on recent colonoscopy. No other cause seen on exam March 2023. Recent CBC is stable. Will treat with local therapy. Discussed logistics of suppository use with encouragement. Will proceed with hemorrhoidal banding if bleeding persists.  ? ?Longstanding GERD with LA Class D reflux esophagitis on EGD 2021. Symptoms remain well controlled on daily PPI.   ? ? ?PLAN: ?- Continue esomeprazole 40 mg QD ?- Continue high fiber diet ?- Please drink at least 1.5-2 liters of water every day - not just coffee ?- Continue daily stool bulking agent with psyllium or methylcellulose ?- Start Anusol HC 2.5% suppositories twice daily ?- Referral for hemorrhoidal banding if not responding to local therapy ? ? ?HPI: David Berry. is a 56 y.o. male returns today in follow-up for hemorrhoids. He has previously been seen for reflux and an esophageal stricture with symptoms well controled on omeprazole 40 mg BID.  His history if remarkable for esophageal surgery as a child, reporting being in and out of the hospital many times between the ages of 57 and 54 years old for esophageal problems.  However he did not have major swallowing problems for many years.  He learned to compensate by chewing well and eating slowly.   ? ?He has metastatic poorly differentiated adenocarcinoma of the lung presenting with a large neck mass, mediastinal mass, renal mass, and possible bone lesions.  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  He is currently receiving maintenance treatment with atezolizumab.  ? ?He was seen 07/29/21 for rectal bleeding after every  bowel movement with associated rectal discomfort but no pain. No straining.  No diarrhea or constipation.  Some improvement with Preparation H suppositories. He would rather take something orally.  Locally therapy was recommended. ? ?He returns today in follow-up. He continues to have painless rectal bleeding with each bowel movement.  He did not start therapy with the Anusol [facility] because the pharmacy dd not supply the suppositories. However, he was reluctant to even try the suppositories. No new complaints or concerns at this time.  ? ?Labs from last week show a stable hemoglobin 12.6, MCV 85.2, RDW 15.9..  ? ?Endoscopic history: ?- Screening colonoscopy 07/09/2019 showed internal hemorrhoids and was otherwise normal ?- EGD for reflux 07/09/2019 showed LA class D reflux esophagitis, benign-appearing stenosis located 40 cm from the incisors that could not be traversed due to resistance.  A small rent developed with attempted endoscopic evaluation. ? ?Abdominal imaging: ?- Barium swallow 07/19/2019 showed a mild stricture at C6-7, successful but slow passage of a barium tablet through this area, hiatal hernia with moderate reflux, and a moderate stricture above the hiatal hernia ?- CT of the chest, abdomen, and pelvis 11/08/2020 showed generalized mild smooth wall thickening throughout the thoracic esophagus unchanged.  Right paratracheal soft tissue enlargement.  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  No new or progressive metastatic disease. ? ? ? ?Past Medical History:  ?Diagnosis Date  ? Anxiety   ? Bipolar disorder ([place])   ? Blood in stool   ? Cancer [facility])   ? 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 pt  denies this  ? GERD (gastroesophageal reflux disease)   ? Lung cancer ([place]) dx'd 01/2019  ? Peripheral vascular disease ([place])   ? blood clot in neck Sept 12, 2020  ? ? ?Past Surgical History:  ?Procedure Laterality Date  ? APPENDECTOMY    ? teenager  ? INGUINAL HERNIA REPAIR Right  2016, 2017  ? [place], [facility]-removed mesh  ? IR IMAGING GUIDED PORT INSERTION  04/26/2019  ? TOOTH EXTRACTION N/A 03/14/2020  ? Procedure: DENTAL RESTORATION/EXTRACTIONS;  Surgeon: Diona Browner, DDS;  Location: [facility];  Service: Oral Surgery;  Laterality: N/A;  ? ? ?Current Outpatient Medications  ?Medication Sig Dispense Refill  ? Cyanocobalamin (VITAMIN B 12 PO) Take 2,500 mcg by mouth daily.    ? ELIQUIS 5 MG TABS tablet TAKE 1 TABLET(5 MG) BY MOUTH TWICE DAILY 60 tablet 11  ? esomeprazole (NEXIUM) 40 MG capsule Take 1 capsule (40 mg total) by mouth daily. 90 capsule 2  ? hydrocortisone (ANUSOL-HC) 2.5 % rectal cream Place 1 application. rectally 2 (two) times daily. 30 g 1  ? lidocaine-prilocaine (EMLA) cream Apply 1 application topically as needed. 30 g 0  ? propranolol (INDERAL) 20 MG tablet Take 1 tablet (20 mg total) by mouth 2 (two) times daily. 60 tablet 5  ? sildenafil (VIAGRA) 50 MG tablet Take 1 tablet (50 mg total) by mouth daily as needed for erectile dysfunction. Take 30 mins to 4 hours prior to sexual activity 20 tablet 1  ? ?No current facility-administered medications for this visit.  ? ? ?Allergies as of 09/01/2021  ? (No Active Allergies)  ? ? ?Family History  ?Problem Relation Age of Onset  ? Prostate cancer Father   ? ? ? ?Physical Exam: ?Complete physical exam not performed due to the limits inherent in a telehealth encounter.  ?Gen: Awake, alert, and oriented, and well communicative. ?HEENT: EOMI, non-icteric sclera, NCAT, MMM  ?Neck: Normal movement of head and neck  ?Pulm: No labored breathing, speaking in full sentences without conversational dyspnea  ?Rectal: Deferred today ?Derm: No apparent lesions or bruising in visible field  ?MS: Moves all visible extremities without noticeable abnormality  ?Psych: Pleasant, cooperative, normal speech, thought processing seemingly intact   ? ? ? ? ?Alizey Noren L. Tar[facility] Glenn, MD, MPH ?09/01/2021, 9:04 AM ? ? ? ?  ?

## 2021-09-03 ENCOUNTER — Encounter: Payer: Self-pay | Admitting: Hematology

## 2021-09-07 ENCOUNTER — Encounter (HOSPITAL_COMMUNITY): Payer: Self-pay

## 2021-09-07 ENCOUNTER — Ambulatory Visit (HOSPITAL_COMMUNITY)
Admission: RE | Admit: 2021-09-07 | Discharge: 2021-09-07 | Disposition: A | Discharge status: Home / Self Care | Payer: Medicare Other | Source: Ambulatory Visit | Attending: Hematology | Admitting: Hematology

## 2021-09-07 DIAGNOSIS — C3491 Malignant neoplasm of unspecified part of right bronchus or lung: Secondary | ICD-10-CM | POA: Diagnosis not present

## 2021-09-07 MED ORDER — SODIUM CHLORIDE (PF) 0.9 % IJ SOLN
INTRAMUSCULAR | Status: AC
  Filled 2021-09-07: qty 50

## 2021-09-07 MED ORDER — HEPARIN SOD (PORK) LOCK FLUSH 100 UNIT/ML IV SOLN
INTRAVENOUS | Status: AC
  Filled 2021-09-07: qty 5

## 2021-09-07 MED ORDER — IOHEXOL 300 MG/ML  SOLN
100.0000 mL | Freq: Once | INTRAMUSCULAR | Status: AC | PRN
  Administered 2021-09-07: 100 mL via INTRAVENOUS

## 2021-09-07 MED ORDER — HEPARIN SOD (PORK) LOCK FLUSH 100 UNIT/ML IV SOLN
500.0000 [IU] | Freq: Once | INTRAVENOUS | Status: AC
Start: 2021-09-07 — End: 2021-09-07
  Administered 2021-09-07: 500 [IU] via INTRAVENOUS

## 2021-09-16 ENCOUNTER — Inpatient Hospital Stay: Payer: Medicare Other

## 2021-09-16 ENCOUNTER — Inpatient Hospital Stay: Payer: Medicare Other | Attending: Physician Assistant

## 2021-09-16 ENCOUNTER — Other Ambulatory Visit: Payer: Self-pay

## 2021-09-16 ENCOUNTER — Inpatient Hospital Stay (HOSPITAL_BASED_OUTPATIENT_CLINIC_OR_DEPARTMENT_OTHER): Payer: Medicare Other | Admitting: Hematology

## 2021-09-16 VITALS — BP 114/86 | HR 66 | Temp 97.7°F | Wt 132.4 lb

## 2021-09-16 DIAGNOSIS — Z95828 Presence of other vascular implants and grafts: Secondary | ICD-10-CM

## 2021-09-16 DIAGNOSIS — N529 Male erectile dysfunction, unspecified: Secondary | ICD-10-CM | POA: Insufficient documentation

## 2021-09-16 DIAGNOSIS — Z5112 Encounter for antineoplastic immunotherapy: Secondary | ICD-10-CM

## 2021-09-16 DIAGNOSIS — Z79899 Other long term (current) drug therapy: Secondary | ICD-10-CM | POA: Insufficient documentation

## 2021-09-16 DIAGNOSIS — C3491 Malignant neoplasm of unspecified part of right bronchus or lung: Secondary | ICD-10-CM | POA: Diagnosis present

## 2021-09-16 DIAGNOSIS — C771 Secondary and unspecified malignant neoplasm of intrathoracic lymph nodes: Secondary | ICD-10-CM | POA: Diagnosis present

## 2021-09-16 DIAGNOSIS — Z7901 Long term (current) use of anticoagulants: Secondary | ICD-10-CM | POA: Insufficient documentation

## 2021-09-16 DIAGNOSIS — D649 Anemia, unspecified: Secondary | ICD-10-CM | POA: Insufficient documentation

## 2021-09-16 DIAGNOSIS — Z86718 Personal history of other venous thrombosis and embolism: Secondary | ICD-10-CM | POA: Diagnosis not present

## 2021-09-16 DIAGNOSIS — Z87891 Personal history of nicotine dependence: Secondary | ICD-10-CM | POA: Insufficient documentation

## 2021-09-16 DIAGNOSIS — Z7189 Other specified counseling: Secondary | ICD-10-CM

## 2021-09-16 LAB — CMP (CANCER CENTER ONLY)
ALT: 10 U/L (ref 0–44)
AST: 14 U/L — ABNORMAL LOW (ref 15–41)
Albumin: 4.1 g/dL (ref 3.5–5.0)
Alkaline Phosphatase: 89 U/L (ref 38–126)
Anion gap: 5 (ref 5–15)
BUN: 9 mg/dL (ref 6–20)
CO2: 28 mmol/L (ref 22–32)
Calcium: 9.3 mg/dL (ref 8.9–10.3)
Chloride: 106 mmol/L (ref 98–111)
Creatinine: 0.87 mg/dL (ref 0.61–1.24)
GFR, Estimated: >60 mL/min (ref >60)
Glucose, Bld: 106 mg/dL — ABNORMAL HIGH (ref 70–99)
Potassium: 3.8 mmol/L (ref 3.5–5.1)
Sodium: 139 mmol/L (ref 135–145)
Total Bilirubin: 0.5 mg/dL (ref 0.3–1.2)
Total Protein: 7.1 g/dL (ref 6.5–8.1)

## 2021-09-16 LAB — CBC WITH DIFFERENTIAL/PLATELET
Abs Immature Granulocytes: 0.01 10*3/uL (ref 0.00–0.07)
Basophils Absolute: 0 10*3/uL (ref 0.0–0.1)
Basophils Relative: 1 %
Eosinophils Absolute: 0.1 10*3/uL (ref 0.0–0.5)
Eosinophils Relative: 2 %
HCT: 39 % (ref 39.0–52.0)
Hemoglobin: 12.5 g/dL — ABNORMAL LOW (ref 13.0–17.0)
Immature Granulocytes: 0 %
Lymphocytes Relative: 22 %
Lymphs Abs: 0.9 10*3/uL (ref 0.7–4.0)
MCH: 27.5 pg (ref 26.0–34.0)
MCHC: 32.1 g/dL (ref 30.0–36.0)
MCV: 85.9 fL (ref 80.0–100.0)
Monocytes Absolute: 0.6 10*3/uL (ref 0.1–1.0)
Monocytes Relative: 16 %
Neutro Abs: 2.4 10*3/uL (ref 1.7–7.7)
Neutrophils Relative %: 59 %
Platelets: 293 10*3/uL (ref 150–400)
RBC: 4.54 MIL/uL (ref 4.22–5.81)
RDW: 14.8 % (ref 11.5–15.5)
WBC: 4 10*3/uL (ref 4.0–10.5)
nRBC: 0 % (ref 0.0–0.2)

## 2021-09-16 MED ORDER — SODIUM CHLORIDE 0.9 % IV SOLN
1200.0000 mg | Freq: Once | INTRAVENOUS | Status: AC
  Administered 2021-09-16: 1200 mg via INTRAVENOUS
  Filled 2021-09-16: qty 20

## 2021-09-16 MED ORDER — DIPHENHYDRAMINE HCL 25 MG PO CAPS
25.0000 mg | ORAL_CAPSULE | Freq: Once | ORAL | Status: AC
  Administered 2021-09-16: 25 mg via ORAL
  Filled 2021-09-16: qty 1

## 2021-09-16 MED ORDER — ESOMEPRAZOLE MAGNESIUM 40 MG PO CPDR: 40.0000 mg | DELAYED_RELEASE_CAPSULE | Freq: Every day | ORAL | 2 refills | Status: DC

## 2021-09-16 MED ORDER — HEPARIN SOD (PORK) LOCK FLUSH 100 UNIT/ML IV SOLN
500.0000 [IU] | Freq: Once | INTRAVENOUS | Status: AC | PRN
  Administered 2021-09-16: 500 [IU]

## 2021-09-16 MED ORDER — SODIUM CHLORIDE 0.9 % IV SOLN: 200.0000 mg | Freq: Once | INTRAVENOUS | Status: DC

## 2021-09-16 MED ORDER — LORATADINE 10 MG PO TABS: 10.0000 mg | ORAL_TABLET | Freq: Every day | ORAL | Status: DC

## 2021-09-16 MED ORDER — LIDOCAINE-PRILOCAINE 2.5-2.5 % EX CREA: 1.0000 "application " | TOPICAL_CREAM | CUTANEOUS | 0 refills | Status: DC | PRN

## 2021-09-16 MED ORDER — ACETAMINOPHEN 325 MG PO TABS
650.0000 mg | ORAL_TABLET | Freq: Once | ORAL | Status: AC
  Administered 2021-09-16: 650 mg via ORAL
  Filled 2021-09-16: qty 2

## 2021-09-16 MED ORDER — SILDENAFIL CITRATE 50 MG PO TABS: 50.0000 mg | ORAL_TABLET | Freq: Every day | ORAL | 1 refills | Status: DC | PRN

## 2021-09-16 MED ORDER — SODIUM CHLORIDE 0.9 % IV SOLN: Freq: Once | INTRAVENOUS | Status: AC

## 2021-09-16 MED ORDER — ALTEPLASE 2 MG IJ SOLR: 2.0000 mg | Freq: Once | INTRAMUSCULAR | Status: DC | PRN

## 2021-09-16 MED ORDER — FAMOTIDINE 20 MG PO TABS
20.0000 mg | ORAL_TABLET | Freq: Once | ORAL | Status: AC
  Administered 2021-09-16: 20 mg via ORAL
  Filled 2021-09-16: qty 1

## 2021-09-16 MED ORDER — SODIUM CHLORIDE 0.9% FLUSH
10.0000 mL | INTRAVENOUS | Status: DC | PRN
  Administered 2021-09-16: 10 mL

## 2021-09-16 MED ORDER — ACETAMINOPHEN 325 MG PO TABS: 650.0000 mg | ORAL_TABLET | Freq: Once | ORAL | Status: DC

## 2021-09-16 MED ORDER — SODIUM CHLORIDE 0.9 % IV SOLN: Freq: Once | INTRAVENOUS | Status: DC

## 2021-09-16 MED ORDER — HEPARIN SOD (PORK) LOCK FLUSH 100 UNIT/ML IV SOLN: 500.0000 [IU] | Freq: Once | INTRAVENOUS | Status: DC | PRN

## 2021-09-16 NOTE — Patient Instructions (Signed)
David Berry  Discharge Instructions: ?Thank you for choosing [facility] to provide your oncology and hematology care.  ? ?If you have a lab appointment with the [facility], please go directly to the [facility] and check in at the registration area. ?  ?Wear comfortable clothing and clothing appropriate for easy access to any Portacath or PICC line.  ? ?We strive to give you quality time with your provider. You may need to reschedule your appointment if you arrive late (15 or more minutes).  Arriving late affects you and other patients whose appointments are after yours.  Also, if you miss three or more appointments without notifying the office, you may be dismissed from the clinic at the provider?s discretion.    ?  ?For prescription refill requests, have your pharmacy contact our office and allow 72 hours for refills to be completed.   ? ?Today you received the following chemotherapy and/or immunotherapy agents: Tecentriq.     ?  ?To help prevent nausea and vomiting after your treatment, we encourage you to take your nausea medication as directed. ? ?BELOW ARE SYMPTOMS THAT SHOULD BE REPORTED IMMEDIATELY: ?*FEVER GREATER THAN 100.4 F (38 ?C) OR HIGHER ?*CHILLS OR SWEATING ?*NAUSEA AND VOMITING THAT IS NOT CONTROLLED WITH YOUR NAUSEA MEDICATION ?*UNUSUAL SHORTNESS OF BREATH ?*UNUSUAL BRUISING OR BLEEDING ?*URINARY PROBLEMS (pain or burning when urinating, or frequent urination) ?*BOWEL PROBLEMS (unusual diarrhea, constipation, pain near the anus) ?TENDERNESS IN MOUTH AND THROAT WITH OR WITHOUT PRESENCE OF ULCERS (sore throat, sores in mouth, or a toothache) ?UNUSUAL RASH, SWELLING OR PAIN  ?UNUSUAL VAGINAL DISCHARGE OR ITCHING  ? ?Items with * indicate a potential emergency and should be followed up as soon as possible or go to the Emergency Department if any problems should occur. ? ?Please show the CHEMOTHERAPY ALERT CARD or IMMUNOTHERAPY ALERT CARD at check-in  to the Emergency Department and triage nurse. ? ?Should you have questions after your visit or need to cancel or reschedule your appointment, please contact [facility]  Dept: [phone]  and follow the prompts.  Office hours are 8:00 a.m. to 4:30 p.m. Monday - Friday. Please note that voicemails left after 4:00 p.m. may not be returned until the following business day.  We are closed weekends and major holidays. You have access to a nurse at all times for urgent questions. Please call the main number to the clinic Dept: [phone] and follow the prompts. ? ? ?For any non-urgent questions, you may also contact your provider using MyChart. We now offer e-Visits for anyone 100 and older to request care online for non-urgent symptoms. For details visit [email or web address]. ?  ?Also download the MyChart app! Go to the app store, search "MyChart", open the app, select [facility], and log in with your MyChart username and password. ? ?Due to Covid, a mask is required upon entering the hospital/clinic. If you do not have a mask, one will be given to you upon arrival. For doctor visits, patients may have 1 support person aged 55 or older with them. For treatment visits, patients cannot have anyone with them due to current Covid guidelines and our immunocompromised population.  ? ?

## 2021-09-16 NOTE — Progress Notes (Signed)
?  ? ? ?HEMATOLOGY/ONCOLOGY CLINIC NOTE ? ?Date of Service: 09/16/2021 ? ? ?Patient Care Team: ?[name], MD as PCP - General (Family Medicine)  ? ?CHIEF COMPLAINTS/PURPOSE OF CONSULTATION:  ?follow-up continued evaluation and management of lung adenocarcinoma ? ?HISTORY OF PRESENTING ILLNESS:  ?Please see previous notes for details on initial presentation. ? ?Current Treatment:  ?Atezolizumab maintenance  ? ?INTERVAL HISTORY:  ? ?David Blakes. is here for continued evaluation and management of metastatic lung adenocarcinoma. ?He notes no acute new symptoms since his last clinic visit. ?No new notable toxicities from his immunotherapy at this time. ?He notes that he has noted intermittent issues with a reducible inguinal hernia. ?No fevers no chills no night sweats.  No new lumps or bumps.[name] ?Labs done today were reviewed with the patient in detail. ?CT chest abdomen pelvis shows no clear evidence of lung cancer progression at this time. ? ? ?MEDICAL HISTORY:  ?Past Medical History:  ?Diagnosis Date  ? Anxiety   ? Bipolar disorder ([place])   ? Blood in stool   ? Cancer [facility])   ? 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 pt denies this  ? GERD (gastroesophageal reflux disease)   ? Lung cancer ([place]) dx'd 01/2019  ? Peripheral vascular disease ([place])   ? blood clot in neck Sept 12, 2020  ? ? ?SURGICAL HISTORY: ?Past Surgical History:  ?Procedure Laterality Date  ? APPENDECTOMY    ? teenager  ? INGUINAL HERNIA REPAIR Right 2016, 2017  ? [place], 2018 [facility]-removed mesh  ? IR IMAGING GUIDED PORT INSERTION  04/26/2019  ? TOOTH EXTRACTION N/A 03/14/2020  ? Procedure: DENTAL RESTORATION/EXTRACTIONS;  Surgeon: Diona Browner, DDS;  Location: [facility];  Service: Oral Surgery;  Laterality: N/A;  ? ? ?SOCIAL HISTORY: ?Social History  ? ?Socioeconomic History  ? Marital status: Single  ?  Spouse name: Not on file  ? Number of children: 1  ? Years of education: GED  ? Highest education level: Some college,  no degree  ?Occupational History  ?  Comment: fork lift driver  ?Tobacco Use  ? Smoking status: Former  ?  Types: Cigars  ?  Quit date: 01/20/2019  ?  Years since quitting: 2.6  ? Smokeless tobacco: Never  ? Tobacco comments:  ?  smokes 3 black and mild cigars per day  ?Substance and Sexual Activity  ? Alcohol use: Not Currently  ? Drug use: No  ? Sexual activity: Yes  ?Other Topics Concern  ? Not on file  ?Social History Narrative  ? Lives alone  ? Caffeine- coffee, 20 oz daily, tea occas  ? ?Social Determinants of Health  ? ?Financial Resource Strain: Low Risk   ? Difficulty of Paying Living Expenses: Not very hard  ?Food Insecurity: No Food Insecurity  ? Worried About Charity fundraiser in the Last Year: Never true  ? Ran Out of Food in the Last Year: Never true  ?Transportation Needs: No Transportation Needs  ? Lack of Transportation (Medical): No  ? Lack of Transportation (Non-Medical): No  ?Physical Activity: Sufficiently Active  ? Days of Exercise per Week: 3 days  ? Minutes of Exercise per Session: 150+ min  ?Stress: No Stress Concern Present  ? Feeling of Stress : Not at all  ?Social Connections: Socially Isolated  ? Frequency of Communication with Friends and Family: More than three times a week  ? Frequency of Social Gatherings with Friends and Family: Once a week  ?  Attends Religious Services: Never  ? Active Member of Clubs or Organizations: No  ? Attends Archivist Meetings: Not on file  ? Marital Status: Never married  ?Intimate Partner Violence: Not on file  ? ? ?FAMILY HISTORY: ?Family History  ?Problem Relation Age of Onset  ? Prostate cancer Father   ? ? ?ALLERGIES:  has no active allergies. ? ?MEDICATIONS:  ?Current Outpatient Medications  ?Medication Sig Dispense Refill  ? Cyanocobalamin (VITAMIN B 12 PO) Take 2,500 mcg by mouth daily.    ? ELIQUIS 5 MG TABS tablet TAKE 1 TABLET(5 MG) BY MOUTH TWICE DAILY 60 tablet 11  ? esomeprazole (NEXIUM) 40 MG capsule Take 1 capsule (40 mg total)  by mouth daily. 90 capsule 2  ? hydrocortisone (ANUSOL-HC) 2.5 % rectal cream Place 1 application. rectally 2 (two) times daily. 30 g 1  ? lidocaine-prilocaine (EMLA) cream Apply 1 application. topically as needed. 30 g 0  ? propranolol (INDERAL) 20 MG tablet Take 1 tablet (20 mg total) by mouth 2 (two) times daily. 60 tablet 5  ? sildenafil (VIAGRA) 50 MG tablet Take 1 tablet (50 mg total) by mouth daily as needed for erectile dysfunction. Take 30 mins to 4 hours prior to sexual activity 30 tablet 1  ? ?No current facility-administered medications for this visit.  ? ?REVIEW OF SYSTEMS:   ?10 Point review of Systems was done is negative except as noted above. ? ?PHYSICAL EXAMINATION: ?ECOG FS:1 - Symptomatic but completely ambulatory ? ?Vitals:  ? 09/16/21 0913  ?BP: 114/86  ?Pulse: 66  ?Temp: 97.7 ?F (36.5 ?C)  ?SpO2: 100%  ? ? ? ?Wt Readings from Last 3 Encounters:  ?09/16/21 132 lb 6.4 oz (60.1 kg)  ?09/01/21 134 lb (60.8 kg)  ?08/26/21 132 lb 1.9 oz (59.9 kg)  ? ?Body mass index is 18.47 kg/m?[name]   ?NAD ?GENERAL:alert, in no acute distress and comfortable ?SKIN: no acute rashes, no significant lesions ?EYES: conjunctiva are pink and non-injected, sclera anicteric ?OROPHARYNX: MMM, no exudates, no oropharyngeal erythema or ulceration ?NECK: supple, no JVD ?LYMPH:  no palpable lymphadenopathy in the cervical, axillary or inguinal regions ?LUNGS: clear to auscultation b/l with normal respiratory effort ?HEART: regular rate & rhythm ?ABDOMEN:  normoactive bowel sounds , non tender, not distended. ?Extremity: no pedal edema ?PSYCH: alert & oriented x 3 with fluent speech ?NEURO: no focal motor/sensory deficits ? ?LABORATORY DATA:  ?I have reviewed the data as listed ? ?. ? ?  Latest Ref Rng & Units 09/16/2021  ?  8:57 AM 08/26/2021  ?  8:53 AM 08/05/2021  ?  8:56 AM  ?CBC  ?WBC 4.0 - 10.5 K/uL 4.0   4.3   3.7    ?Hemoglobin 13.0 - 17.0 g/dL 12.5   12.6   11.9    ?Hematocrit 39.0 - 52.0 % 39.0   39.2   37.7    ?Platelets  150 - 400 K/uL 293   245   262    ? ? ?. ? ?  Latest Ref Rng & Units 09/16/2021  ?  8:57 AM 08/26/2021  ?  8:53 AM 08/05/2021  ?  8:56 AM  ?CMP  ?Glucose 70 - 99 mg/dL 106   129   118    ?BUN 6 - 20 mg/dL _0 ?Creatinine 0.61 - 1.24 mg/dL 0.87   0.88   0.86    ?Sodium 135 - 145 mmol/L 139   138  140    ?Potassium 3.5 - 5.1 mmol/L 3.8   4.0   3.9    ?Chloride 98 - 111 mmol/L 106   103   106    ?CO2 22 - 32 mmol/L _0 ?Calcium 8.9 - 10.3 mg/dL 9.3   9.7   9.4    ?Total Protein 6.5 - 8.1 g/dL 7.1   7.1   6.6    ?Total Bilirubin 0.3 - 1.2 mg/dL 0.5   0.4   0.3    ?Alkaline Phos 38 - 126 U/L 89   99   91    ?AST 15 - 41 U/L _1 ?ALT 0 - 44 U/L _2 ? ?Marland KitchenNo results found for: LDH ? ?01/22/2019 [facility]: Tumor Mutational Burden  ? ? ? ?01/22/2019 PD-L1 Immunohistochemistry Analysis  ? ? ?01/22/2019 Soft Tissue Needle Core Biopsy Surgical Pathology  ? ? ? ?RADIOGRAPHIC STUDIES: ?I have personally reviewed the radiological images as listed and agreed with the findings in the report. ?CT CHEST ABDOMEN PELVIS W CONTRAST ? ?Addendum Date: 09/07/2021   ?ADDENDUM REPORT: 09/07/2021 12:47 ADDENDUM: body onc Electronically Signed   By: Dahlia Bailiff M.D.   On: 09/07/2021 12:47  ? ?Result Date: 09/07/2021 ?CLINICAL DATA:  History of lung cancer, chemotherapy and radiation complete, immunotherapy ongoing. Linking EXAM: CT CHEST, ABDOMEN, AND PELVIS WITH CONTRAST TECHNIQUE: Multidetector CT imaging of the chest, abdomen and pelvis was performed following the standard protocol during bolus administration of intravenous contrast. RADIATION DOSE REDUCTION: This exam was performed according to the departmental dose-optimization program which includes automated exposure control, adjustment of the mA and/or kV according to patient size and/or use of iterative reconstruction technique. CONTRAST:  125m OMNIPAQUE IOHEXOL 300 MG/ML  SOLN COMPARISON:  Multiple priors including most recent PET-CT  June 19, 2021 . FINDINGS: CT CHEST FINDINGS Cardiovascular: Accessed left chest Port-A-Cath with tip at the superior cavoatrial junction. Aortic atherosclerosis without abdominal aortic aneurysm. No cent

## 2021-09-17 ENCOUNTER — Other Ambulatory Visit: Payer: Self-pay | Admitting: Family Medicine

## 2021-09-22 ENCOUNTER — Encounter: Payer: Self-pay | Admitting: Hematology

## 2021-09-22 ENCOUNTER — Telehealth: Payer: Self-pay | Admitting: Hematology

## 2021-09-22 ENCOUNTER — Ambulatory Visit
Admission: RE | Admit: 2021-09-22 | Discharge: 2021-09-22 | Disposition: A | Discharge status: Home / Self Care | Payer: Medicare Other | Source: Ambulatory Visit | Attending: Family Medicine | Admitting: Family Medicine

## 2021-09-22 DIAGNOSIS — R1032 Left lower quadrant pain: Secondary | ICD-10-CM

## 2021-09-22 IMAGING — US US PELVIS LIMITED
1 series · 14 of 19 positions shown · non-contrast
Comparison: CT abdomen pelvis dated [DATE].

CLINICAL DATA: Right inguinal pain since right hernia repair in
[AC].

EXAM:
ULTRASOUND OF Right GROIN SOFT TISSUES
TECHNIQUE: Ultrasound examination of the groin soft tissues was performed in
the area of clinical concern.

[Series 1: us pelvis limited · 0.06mm/px · 19 acquisitions, 14 frames shown]
[im 1/19]
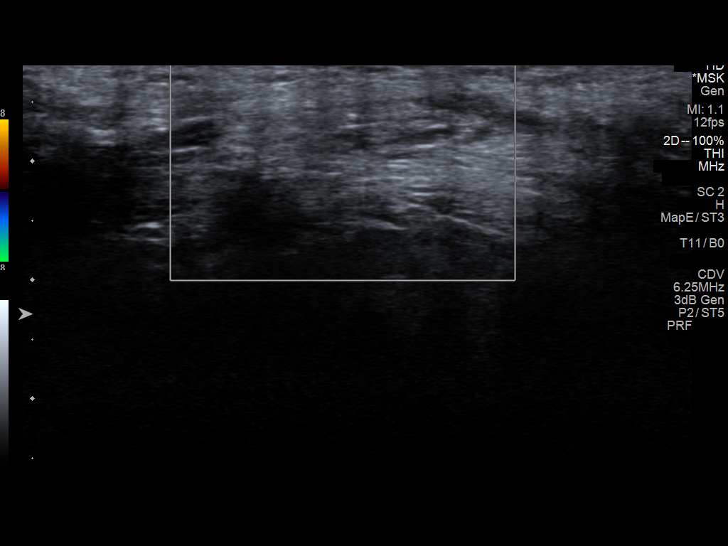
[im 3/19]
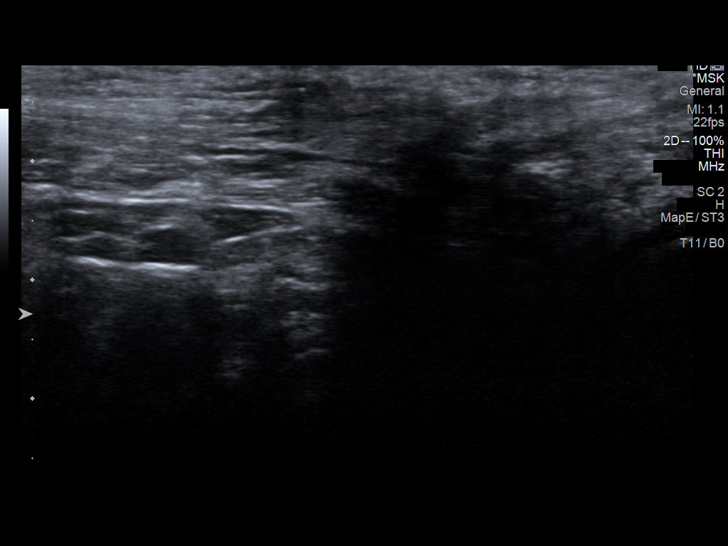
[im 4/19]
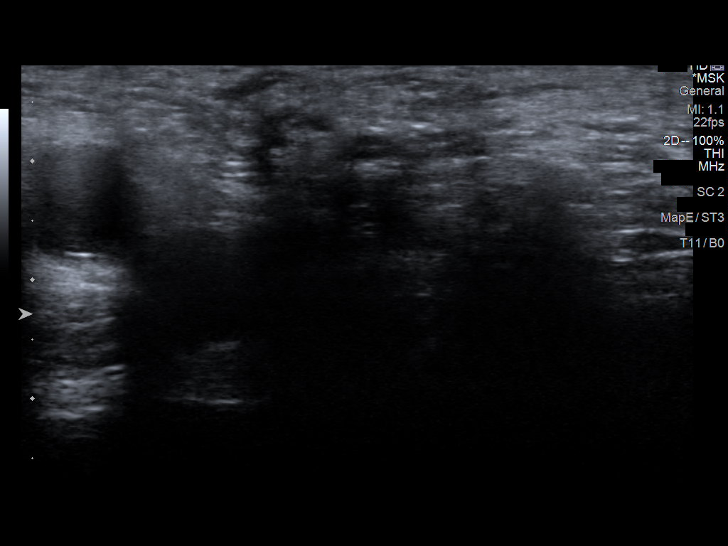
[im 5/19]
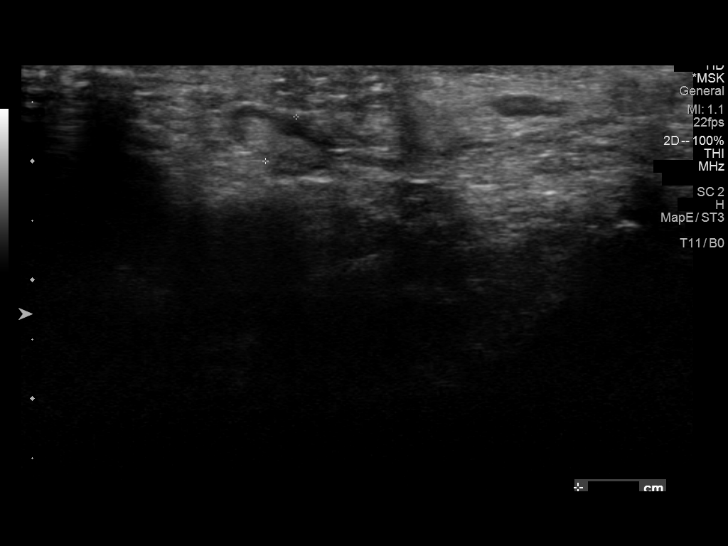
[im 7/19]
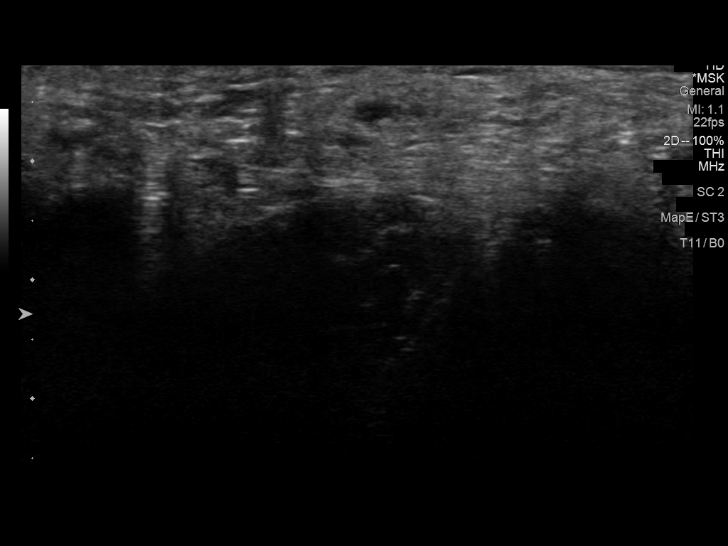
[im 8/19]
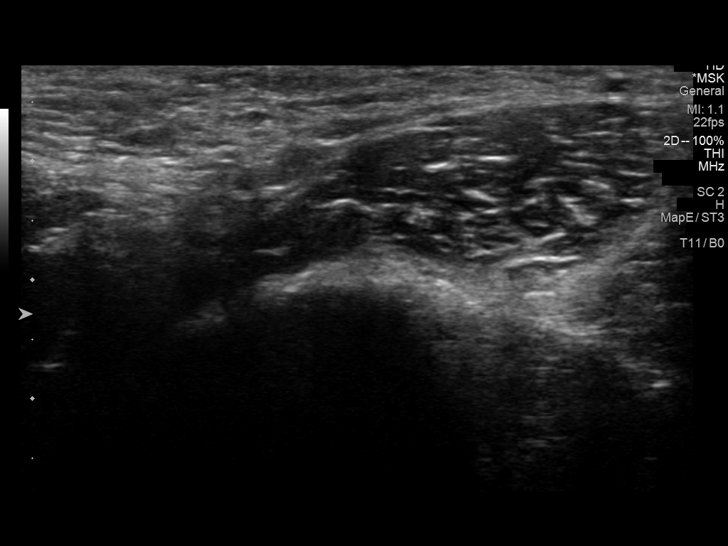
[im 9/19]
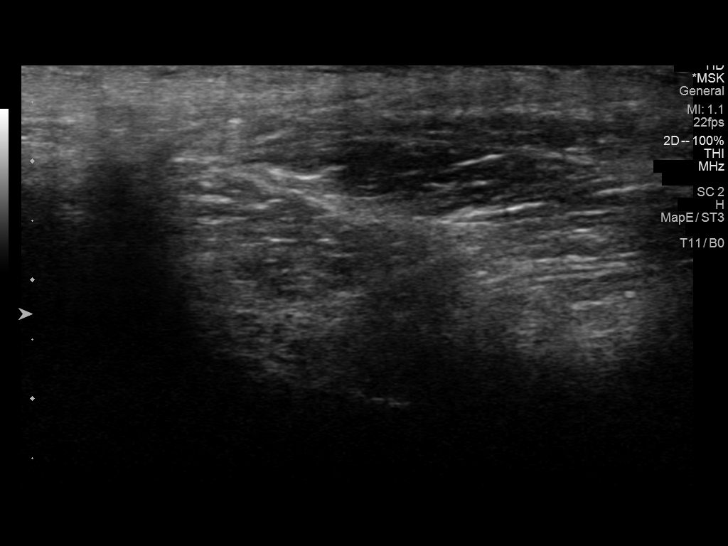
[im 11/19]
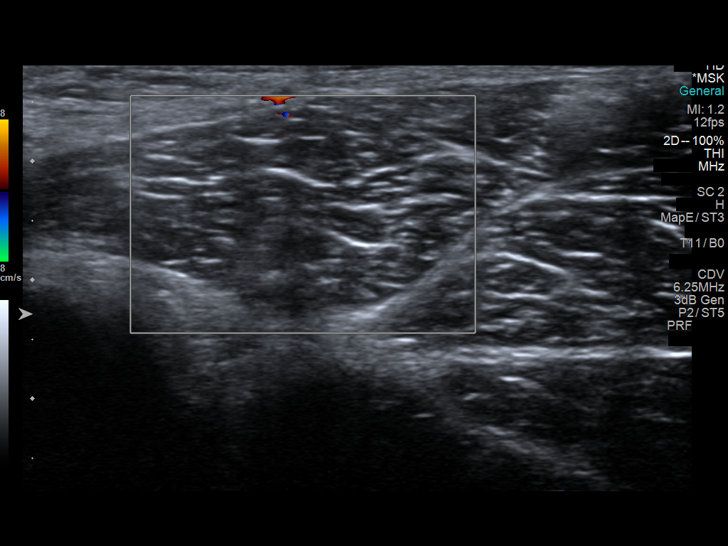
[im 12/19]
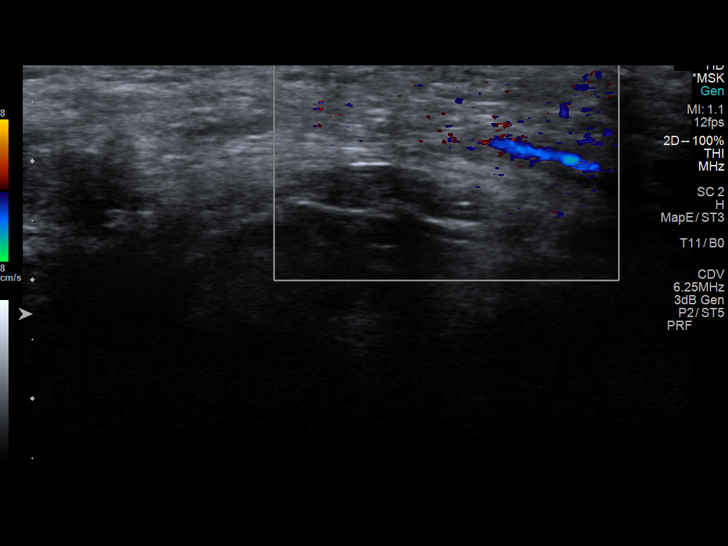
[im 13/19]
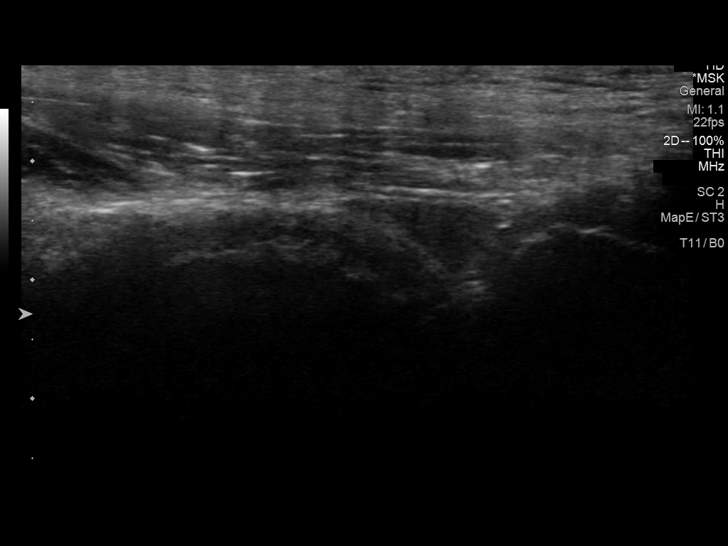
[im 15/19]
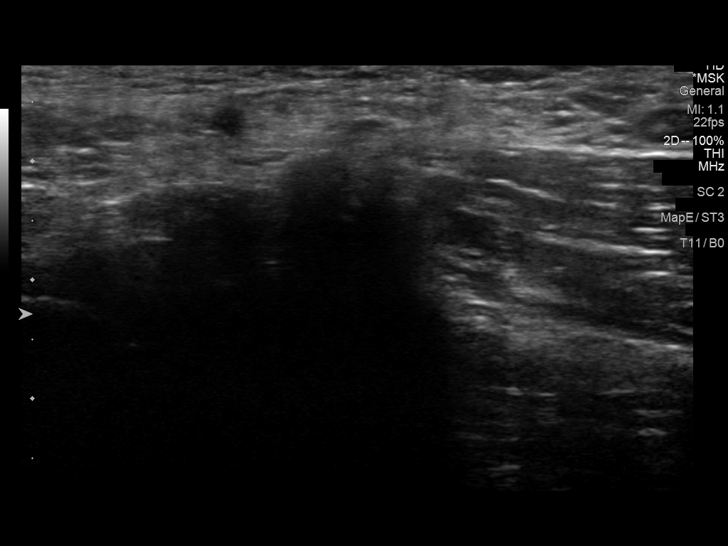
[im 16/19]
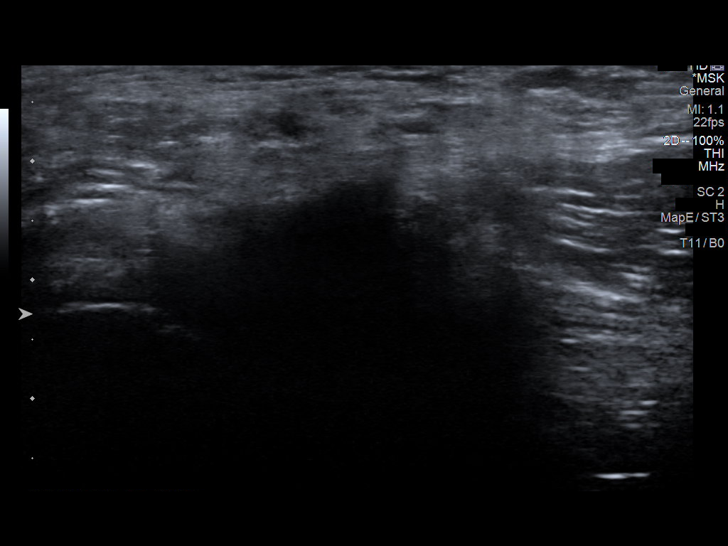
[im 17/19]
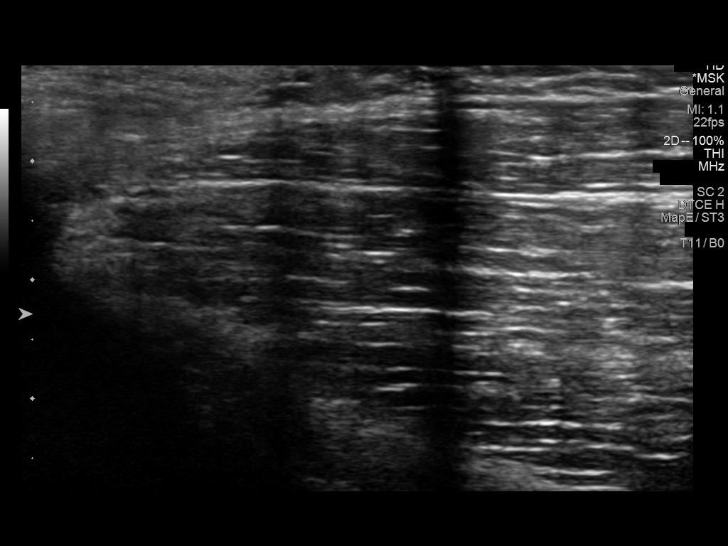
[im 19/19]
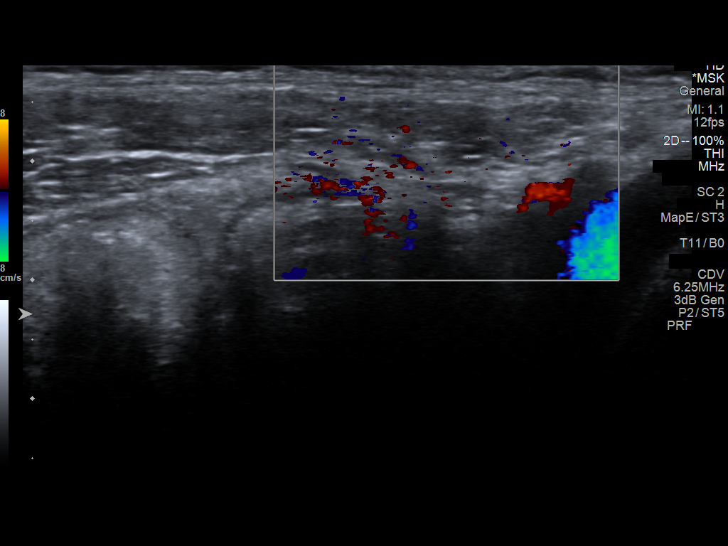

[14 of 19 positions shown; findings below may reference images not displayed]

FINDINGS: Targeted soft tissue ultrasound was performed in the right groin and
inguinal area.

No solid or cystic mass is identified. A normal appearing lymph node
is seen in the right groin. No evidence of inguinal hernia.
IMPRESSION: No findings to explain the patient's symptoms.

## 2021-09-22 NOTE — Telephone Encounter (Signed)
Scheduled follow-up appointments per 5/10 los. Patient is aware. ?

## 2021-10-07 ENCOUNTER — Ambulatory Visit: Payer: Medicare Other | Admitting: Hematology

## 2021-10-07 ENCOUNTER — Inpatient Hospital Stay: Payer: Medicare Other

## 2021-10-07 ENCOUNTER — Other Ambulatory Visit: Payer: Self-pay

## 2021-10-07 VITALS — BP 107/74 | HR 72 | Temp 98.8°F | Resp 18

## 2021-10-07 DIAGNOSIS — Z5112 Encounter for antineoplastic immunotherapy: Secondary | ICD-10-CM | POA: Diagnosis not present

## 2021-10-07 DIAGNOSIS — Z7189 Other specified counseling: Secondary | ICD-10-CM

## 2021-10-07 DIAGNOSIS — C3491 Malignant neoplasm of unspecified part of right bronchus or lung: Secondary | ICD-10-CM

## 2021-10-07 DIAGNOSIS — Z95828 Presence of other vascular implants and grafts: Secondary | ICD-10-CM

## 2021-10-07 LAB — CMP (CANCER CENTER ONLY)
ALT: 12 U/L (ref 0–44)
AST: 15 U/L (ref 15–41)
Albumin: 4.1 g/dL (ref 3.5–5.0)
Alkaline Phosphatase: 98 U/L (ref 38–126)
Anion gap: 6 (ref 5–15)
BUN: 8 mg/dL (ref 6–20)
CO2: 27 mmol/L (ref 22–32)
Calcium: 9.3 mg/dL (ref 8.9–10.3)
Chloride: 108 mmol/L (ref 98–111)
Creatinine: 0.82 mg/dL (ref 0.61–1.24)
GFR, Estimated: >60 mL/min (ref >60)
Glucose, Bld: 102 mg/dL — ABNORMAL HIGH (ref 70–99)
Potassium: 3.7 mmol/L (ref 3.5–5.1)
Sodium: 141 mmol/L (ref 135–145)
Total Bilirubin: 0.4 mg/dL (ref 0.3–1.2)
Total Protein: 6.7 g/dL (ref 6.5–8.1)

## 2021-10-07 LAB — CBC WITH DIFFERENTIAL/PLATELET
Abs Immature Granulocytes: 0.01 10*3/uL (ref 0.00–0.07)
Basophils Absolute: 0 10*3/uL (ref 0.0–0.1)
Basophils Relative: 1 %
Eosinophils Absolute: 0 10*3/uL (ref 0.0–0.5)
Eosinophils Relative: 1 %
HCT: 36.2 % — ABNORMAL LOW (ref 39.0–52.0)
Hemoglobin: 11.8 g/dL — ABNORMAL LOW (ref 13.0–17.0)
Immature Granulocytes: 0 %
Lymphocytes Relative: 23 %
Lymphs Abs: 1.1 10*3/uL (ref 0.7–4.0)
MCH: 27.8 pg (ref 26.0–34.0)
MCHC: 32.6 g/dL (ref 30.0–36.0)
MCV: 85.4 fL (ref 80.0–100.0)
Monocytes Absolute: 0.6 10*3/uL (ref 0.1–1.0)
Monocytes Relative: 13 %
Neutro Abs: 3 10*3/uL (ref 1.7–7.7)
Neutrophils Relative %: 62 %
Platelets: 254 10*3/uL (ref 150–400)
RBC: 4.24 MIL/uL (ref 4.22–5.81)
RDW: 14.4 % (ref 11.5–15.5)
WBC: 4.8 10*3/uL (ref 4.0–10.5)
nRBC: 0 % (ref 0.0–0.2)

## 2021-10-07 LAB — TSH: TSH: 0.695 u[IU]/mL (ref 0.350–4.500)

## 2021-10-07 MED ORDER — DIPHENHYDRAMINE HCL 25 MG PO CAPS
25.0000 mg | ORAL_CAPSULE | Freq: Once | ORAL | Status: AC
  Administered 2021-10-07: 25 mg via ORAL
  Filled 2021-10-07: qty 1

## 2021-10-07 MED ORDER — SODIUM CHLORIDE 0.9% FLUSH
10.0000 mL | INTRAVENOUS | Status: DC | PRN
  Administered 2021-10-07: 10 mL

## 2021-10-07 MED ORDER — HEPARIN SOD (PORK) LOCK FLUSH 100 UNIT/ML IV SOLN
500.0000 [IU] | Freq: Once | INTRAVENOUS | Status: AC | PRN
  Administered 2021-10-07: 500 [IU]

## 2021-10-07 MED ORDER — FAMOTIDINE 20 MG PO TABS
20.0000 mg | ORAL_TABLET | Freq: Once | ORAL | Status: AC
  Administered 2021-10-07: 20 mg via ORAL
  Filled 2021-10-07: qty 1

## 2021-10-07 MED ORDER — SODIUM CHLORIDE 0.9 % IV SOLN: Freq: Once | INTRAVENOUS | Status: AC

## 2021-10-07 MED ORDER — SODIUM CHLORIDE 0.9 % IV SOLN
1200.0000 mg | Freq: Once | INTRAVENOUS | Status: AC
  Administered 2021-10-07: 1200 mg via INTRAVENOUS
  Filled 2021-10-07: qty 20

## 2021-10-07 MED ORDER — ACETAMINOPHEN 325 MG PO TABS
650.0000 mg | ORAL_TABLET | Freq: Once | ORAL | Status: AC
  Administered 2021-10-07: 650 mg via ORAL
  Filled 2021-10-07: qty 2

## 2021-10-07 NOTE — Patient Instructions (Signed)
[facility] ONCOL[facility]  Discharge Instructions: Thank you for choosing [facility] to provide your oncology and hematology care.   If you have a lab appointment with the [facility], please go directly to the [facility] and check in at the registration area.   Wear comfortable clothing and clothing appropriate for easy access to any Portacath or PICC line.   We strive to give you quality time with your provider. You may need to reschedule your appointment if you arrive late (15 or more minutes).  Arriving late affects you and other patients whose appointments are after yours.  Also, if you miss three or more appointments without notifying the office, you may be dismissed from the clinic at the provider's discretion.      For prescription refill requests, have your pharmacy contact our office and allow 72 hours for refills to be completed.    Today you received the following chemotherapy and/or immunotherapy agents: Tecentriq       To help prevent nausea and vomiting after your treatment, we encourage you to take your nausea medication as directed.  BELOW ARE SYMPTOMS THAT SHOULD BE REPORTED IMMEDIATELY: *FEVER GREATER THAN 100.4 F (38 C) OR HIGHER *CHILLS OR SWEATING *NAUSEA AND VOMITING THAT IS NOT CONTROLLED WITH YOUR NAUSEA MEDICATION *UNUSUAL SHORTNESS OF BREATH *UNUSUAL BRUISING OR BLEEDING *URINARY PROBLEMS (pain or burning when urinating, or frequent urination) *BOWEL PROBLEMS (unusual diarrhea, constipation, pain near the anus) TENDERNESS IN MOUTH AND THROAT WITH OR WITHOUT PRESENCE OF ULCERS (sore throat, sores in mouth, or a toothache) UNUSUAL RASH, SWELLING OR PAIN  UNUSUAL VAGINAL DISCHARGE OR ITCHING   Items with * indicate a potential emergency and should be followed up as soon as possible or go to the Emergency Department if any problems should occur.  Please show the CHEMOTHERAPY ALERT CARD or IMMUNOTHERAPY ALERT CARD at check-in to  the Emergency Department and triage nurse.  Should you have questions after your visit or need to cancel or reschedule your appointment, please contact [facility]  Dept: [phone]  and follow the prompts.  Office hours are 8:00 a.m. to 4:30 p.m. Monday - Friday. Please note that voicemails left after 4:00 p.m. may not be returned until the following business day.  We are closed weekends and major holidays. You have access to a nurse at all times for urgent questions. Please call the main number to the clinic Dept: [phone] and follow the prompts.   For any non-urgent questions, you may also contact your provider using MyChart. We now offer e-Visits for anyone 55 and older to request care online for non-urgent symptoms. For details visit [email or web address].   Also download the MyChart app! Go to the app store, search "MyChart", open the app, select [facility], and log in with your MyChart username and password.  Due to Covid, a mask is required upon entering the hospital/clinic. If you do not have a mask, one will be given to you upon arrival. For doctor visits, patients may have 1 support person aged 79 or older with them. For treatment visits, patients cannot have anyone with them due to current Covid guidelines and our immunocompromised population.

## 2021-10-16 ENCOUNTER — Ambulatory Visit (INDEPENDENT_AMBULATORY_CARE_PROVIDER_SITE_OTHER): Payer: Medicare Other | Admitting: Internal Medicine

## 2021-10-16 ENCOUNTER — Encounter: Payer: Self-pay | Admitting: Internal Medicine

## 2021-10-16 VITALS — BP 122/90 | HR 86 | Ht 71.0 in | Wt 130.0 lb

## 2021-10-16 DIAGNOSIS — K648 Other hemorrhoids: Secondary | ICD-10-CM | POA: Diagnosis not present

## 2021-10-16 NOTE — Patient Instructions (Signed)
If you are age 55 or older, your body mass index should be between 23-30. Your Body mass index is 18.13 kg/m. If this is out of the aforementioned range listed, please consider follow up with your Primary Care Provider.  If you are age 101 or younger, your body mass index should be between 19-25. Your Body mass index is 18.13 kg/m. If this is out of the aformentioned range listed, please consider follow up with your Primary Care Provider.   HEMORRHOID BANDING PROCEDURE    FOLLOW-UP CARE   The procedure you have had should have been relatively painless since the banding of the area involved does not have nerve endings and there is no pain sensation.  The rubber band cuts off the blood supply to the hemorrhoid and the band may fall off as soon as 48 hours after the banding (the band may occasionally be seen in the toilet bowl following a bowel movement). You may notice a temporary feeling of fullness in the rectum which should respond adequately to plain Tylenol or Motrin.  Following the banding, avoid strenuous exercise that evening and resume full activity the next day.  A sitz bath (soaking in a warm tub) or bidet is soothing, and can be useful for cleansing the area after bowel movements.     To avoid constipation, take two tablespoons of natural wheat bran, natural oat bran, flax, Benefiber or any over the counter fiber supplement and increase your water intake to 7-8 glasses daily.    Unless you have been prescribed anorectal medication, do not put anything inside your rectum for two weeks: No suppositories, enemas, fingers, etc.  Occasionally, you may have more bleeding than usual after the banding procedure.  This is often from the untreated hemorrhoids rather than the treated one.  Don't be concerned if there is a tablespoon or so of blood.  If there is more blood than this, lie flat with your bottom higher than your head and apply an ice pack to the area. If the bleeding does not stop  within a half an hour or if you feel faint, call our office at [phone] or go to the emergency room.  Problems are not common; however, if there is a substantial amount of bleeding, severe pain, chills, fever or difficulty passing urine (very rare) or other problems, you should call us at [phone] or report to the nearest emergency room.  Do not stay seated continuously for more than 2-3 hours for a day or two after the procedure.  Tighten your buttock muscles 10-15 times every two hours and take 10-15 deep breaths every 1-2 hours.  Do not spend more than a few minutes on the toilet if you cannot empty your bowel; instead re-visit the toilet at a later time.     ________________________________________________________  The [facility] providers would like to encourage you to use [facility] to communicate with providers for non-urgent requests or questions.  Due to long hold times on the telephone, sending your provider a message by [facility] may be a faster and more efficient way to get a response.  Please allow 48 business hours for a response.  Please remember that this is for non-urgent requests.   Thank you for entrusting me with your care and for choosing [facility], Dr. Christia Reading

## 2021-10-16 NOTE — Progress Notes (Signed)
PROCEDURE NOTE: The patient presents with symptomatic grade 1 hemorrhoids, requesting rubber band ligation of his/her hemorrhoidal disease.  All risks, benefits and alternative forms of therapy were described and informed consent was obtained.  In the Left Lateral Decubitus position anoscopic examination revealed grade 1 hemorrhoids. The anorectum was pre-medicated with Recticare and lidocaine The decision was made to band the posterior internal hemorrhoid, and the [place] was used to perform band ligation without complication.  Digital anorectal examination was then performed to assure proper positioning of the band, and to adjust the banded tissue as required.  The patient was discharged home without pain or other issues.  Dietary and behavioral recommendations were given and along with follow-up instructions.     The following adjunctive treatments were recommended: Fiber supplement Drinking water Consider Miralax in the future since stool was palpated on rectal exam  The patient will return in 4 weeks for  follow-up and possible additional banding as required. No complications were encountered and the patient tolerated the procedure well.

## 2021-10-27 ENCOUNTER — Other Ambulatory Visit: Payer: Self-pay | Admitting: *Deleted

## 2021-10-27 DIAGNOSIS — C3491 Malignant neoplasm of unspecified part of right bronchus or lung: Secondary | ICD-10-CM

## 2021-10-28 ENCOUNTER — Inpatient Hospital Stay: Payer: Medicare Other | Attending: Physician Assistant

## 2021-10-28 ENCOUNTER — Inpatient Hospital Stay (HOSPITAL_BASED_OUTPATIENT_CLINIC_OR_DEPARTMENT_OTHER): Payer: Medicare Other | Admitting: Hematology

## 2021-10-28 ENCOUNTER — Inpatient Hospital Stay: Payer: Medicare Other

## 2021-10-28 VITALS — BP 101/89 | HR 69 | Temp 97.5°F | Resp 20 | Wt 134.3 lb

## 2021-10-28 DIAGNOSIS — Z79899 Other long term (current) drug therapy: Secondary | ICD-10-CM | POA: Diagnosis not present

## 2021-10-28 DIAGNOSIS — Z7901 Long term (current) use of anticoagulants: Secondary | ICD-10-CM | POA: Insufficient documentation

## 2021-10-28 DIAGNOSIS — C3491 Malignant neoplasm of unspecified part of right bronchus or lung: Secondary | ICD-10-CM | POA: Diagnosis present

## 2021-10-28 DIAGNOSIS — Z923 Personal history of irradiation: Secondary | ICD-10-CM | POA: Insufficient documentation

## 2021-10-28 DIAGNOSIS — Z5112 Encounter for antineoplastic immunotherapy: Secondary | ICD-10-CM | POA: Diagnosis present

## 2021-10-28 DIAGNOSIS — Z7189 Other specified counseling: Secondary | ICD-10-CM

## 2021-10-28 DIAGNOSIS — C771 Secondary and unspecified malignant neoplasm of intrathoracic lymph nodes: Secondary | ICD-10-CM | POA: Insufficient documentation

## 2021-10-28 DIAGNOSIS — Z86718 Personal history of other venous thrombosis and embolism: Secondary | ICD-10-CM | POA: Diagnosis not present

## 2021-10-28 DIAGNOSIS — D509 Iron deficiency anemia, unspecified: Secondary | ICD-10-CM | POA: Insufficient documentation

## 2021-10-28 DIAGNOSIS — Z95828 Presence of other vascular implants and grafts: Secondary | ICD-10-CM

## 2021-10-28 LAB — CBC WITH DIFFERENTIAL (CANCER CENTER ONLY)
Abs Immature Granulocytes: 0 10*3/uL (ref 0.00–0.07)
Basophils Absolute: 0 10*3/uL (ref 0.0–0.1)
Basophils Relative: 1 %
Eosinophils Absolute: 0 10*3/uL (ref 0.0–0.5)
Eosinophils Relative: 1 %
HCT: 35.6 % — ABNORMAL LOW (ref 39.0–52.0)
Hemoglobin: 11.6 g/dL — ABNORMAL LOW (ref 13.0–17.0)
Immature Granulocytes: 0 %
Lymphocytes Relative: 25 %
Lymphs Abs: 0.9 10*3/uL (ref 0.7–4.0)
MCH: 27.7 pg (ref 26.0–34.0)
MCHC: 32.6 g/dL (ref 30.0–36.0)
MCV: 85 fL (ref 80.0–100.0)
Monocytes Absolute: 0.5 10*3/uL (ref 0.1–1.0)
Monocytes Relative: 14 %
Neutro Abs: 2.3 10*3/uL (ref 1.7–7.7)
Neutrophils Relative %: 59 %
Platelet Count: 228 10*3/uL (ref 150–400)
RBC: 4.19 MIL/uL — ABNORMAL LOW (ref 4.22–5.81)
RDW: 14.2 % (ref 11.5–15.5)
WBC Count: 3.8 10*3/uL — ABNORMAL LOW (ref 4.0–10.5)
nRBC: 0 % (ref 0.0–0.2)

## 2021-10-28 LAB — CMP (CANCER CENTER ONLY)
ALT: 12 U/L (ref 0–44)
AST: 17 U/L (ref 15–41)
Albumin: 4 g/dL (ref 3.5–5.0)
Alkaline Phosphatase: 91 U/L (ref 38–126)
Anion gap: 3 — ABNORMAL LOW (ref 5–15)
BUN: 9 mg/dL (ref 6–20)
CO2: 28 mmol/L (ref 22–32)
Calcium: 9.7 mg/dL (ref 8.9–10.3)
Chloride: 107 mmol/L (ref 98–111)
Creatinine: 0.82 mg/dL (ref 0.61–1.24)
GFR, Estimated: >60 mL/min (ref >60)
Glucose, Bld: 123 mg/dL — ABNORMAL HIGH (ref 70–99)
Potassium: 4.2 mmol/L (ref 3.5–5.1)
Sodium: 138 mmol/L (ref 135–145)
Total Bilirubin: 0.3 mg/dL (ref 0.3–1.2)
Total Protein: 6.6 g/dL (ref 6.5–8.1)

## 2021-10-28 LAB — TSH: TSH: 1.288 u[IU]/mL (ref 0.350–4.500)

## 2021-10-28 MED ORDER — FAMOTIDINE 20 MG PO TABS
20.0000 mg | ORAL_TABLET | Freq: Once | ORAL | Status: AC
  Administered 2021-10-28: 20 mg via ORAL
  Filled 2021-10-28: qty 1

## 2021-10-28 MED ORDER — SODIUM CHLORIDE 0.9% FLUSH
10.0000 mL | INTRAVENOUS | Status: DC | PRN
  Administered 2021-10-28: 10 mL

## 2021-10-28 MED ORDER — SODIUM CHLORIDE 0.9 % IV SOLN
1200.0000 mg | Freq: Once | INTRAVENOUS | Status: AC
  Administered 2021-10-28: 1200 mg via INTRAVENOUS
  Filled 2021-10-28: qty 20

## 2021-10-28 MED ORDER — HEPARIN SOD (PORK) LOCK FLUSH 100 UNIT/ML IV SOLN
500.0000 [IU] | Freq: Once | INTRAVENOUS | Status: AC | PRN
  Administered 2021-10-28: 500 [IU]

## 2021-10-28 MED ORDER — ACETAMINOPHEN 325 MG PO TABS
650.0000 mg | ORAL_TABLET | Freq: Once | ORAL | Status: AC
  Administered 2021-10-28: 650 mg via ORAL
  Filled 2021-10-28: qty 2

## 2021-10-28 MED ORDER — DIPHENHYDRAMINE HCL 25 MG PO CAPS
25.0000 mg | ORAL_CAPSULE | Freq: Once | ORAL | Status: AC
  Administered 2021-10-28: 25 mg via ORAL
  Filled 2021-10-28: qty 1

## 2021-10-28 MED ORDER — SODIUM CHLORIDE 0.9 % IV SOLN: Freq: Once | INTRAVENOUS | Status: AC

## 2021-11-02 ENCOUNTER — Encounter: Payer: Self-pay | Admitting: Hematology

## 2021-11-03 ENCOUNTER — Encounter: Payer: Self-pay | Admitting: Hematology

## 2021-11-08 ENCOUNTER — Other Ambulatory Visit: Payer: Self-pay | Admitting: Internal Medicine

## 2021-11-09 ENCOUNTER — Encounter: Payer: Self-pay | Admitting: Hematology

## 2021-11-11 ENCOUNTER — Encounter: Payer: Self-pay | Admitting: Hematology

## 2021-11-18 ENCOUNTER — Other Ambulatory Visit: Payer: Self-pay

## 2021-11-18 ENCOUNTER — Inpatient Hospital Stay: Payer: Medicare Other

## 2021-11-18 ENCOUNTER — Ambulatory Visit (INDEPENDENT_AMBULATORY_CARE_PROVIDER_SITE_OTHER): Payer: Medicare Other | Admitting: Internal Medicine

## 2021-11-18 ENCOUNTER — Inpatient Hospital Stay: Payer: Medicare Other | Attending: Physician Assistant

## 2021-11-18 ENCOUNTER — Encounter: Payer: Self-pay | Admitting: Internal Medicine

## 2021-11-18 VITALS — BP 122/78 | HR 80 | Ht 71.0 in | Wt 135.4 lb

## 2021-11-18 VITALS — BP 110/69 | HR 63 | Temp 98.2°F | Resp 18 | Wt 135.0 lb

## 2021-11-18 DIAGNOSIS — K649 Unspecified hemorrhoids: Secondary | ICD-10-CM | POA: Diagnosis not present

## 2021-11-18 DIAGNOSIS — Z5112 Encounter for antineoplastic immunotherapy: Secondary | ICD-10-CM | POA: Insufficient documentation

## 2021-11-18 DIAGNOSIS — C3491 Malignant neoplasm of unspecified part of right bronchus or lung: Secondary | ICD-10-CM | POA: Insufficient documentation

## 2021-11-18 DIAGNOSIS — Z7189 Other specified counseling: Secondary | ICD-10-CM

## 2021-11-18 DIAGNOSIS — C771 Secondary and unspecified malignant neoplasm of intrathoracic lymph nodes: Secondary | ICD-10-CM | POA: Insufficient documentation

## 2021-11-18 DIAGNOSIS — Z79899 Other long term (current) drug therapy: Secondary | ICD-10-CM | POA: Diagnosis not present

## 2021-11-18 DIAGNOSIS — Z95828 Presence of other vascular implants and grafts: Secondary | ICD-10-CM

## 2021-11-18 DIAGNOSIS — Z86718 Personal history of other venous thrombosis and embolism: Secondary | ICD-10-CM | POA: Diagnosis not present

## 2021-11-18 DIAGNOSIS — D509 Iron deficiency anemia, unspecified: Secondary | ICD-10-CM | POA: Insufficient documentation

## 2021-11-18 DIAGNOSIS — Z87891 Personal history of nicotine dependence: Secondary | ICD-10-CM | POA: Diagnosis not present

## 2021-11-18 DIAGNOSIS — Z7901 Long term (current) use of anticoagulants: Secondary | ICD-10-CM | POA: Diagnosis not present

## 2021-11-18 LAB — CMP (CANCER CENTER ONLY)
ALT: 10 U/L (ref 0–44)
AST: 14 U/L — ABNORMAL LOW (ref 15–41)
Albumin: 4.1 g/dL (ref 3.5–5.0)
Alkaline Phosphatase: 94 U/L (ref 38–126)
Anion gap: 4 — ABNORMAL LOW (ref 5–15)
BUN: 9 mg/dL (ref 6–20)
CO2: 28 mmol/L (ref 22–32)
Calcium: 9.3 mg/dL (ref 8.9–10.3)
Chloride: 107 mmol/L (ref 98–111)
Creatinine: 0.89 mg/dL (ref 0.61–1.24)
GFR, Estimated: >60 mL/min (ref >60)
Glucose, Bld: 116 mg/dL — ABNORMAL HIGH (ref 70–99)
Potassium: 4 mmol/L (ref 3.5–5.1)
Sodium: 139 mmol/L (ref 135–145)
Total Bilirubin: 0.4 mg/dL (ref 0.3–1.2)
Total Protein: 6.7 g/dL (ref 6.5–8.1)

## 2021-11-18 LAB — CBC WITH DIFFERENTIAL (CANCER CENTER ONLY)
Abs Immature Granulocytes: 0.01 10*3/uL (ref 0.00–0.07)
Basophils Absolute: 0 10*3/uL (ref 0.0–0.1)
Basophils Relative: 1 %
Eosinophils Absolute: 0.1 10*3/uL (ref 0.0–0.5)
Eosinophils Relative: 1 %
HCT: 35 % — ABNORMAL LOW (ref 39.0–52.0)
Hemoglobin: 11.3 g/dL — ABNORMAL LOW (ref 13.0–17.0)
Immature Granulocytes: 0 %
Lymphocytes Relative: 22 %
Lymphs Abs: 0.8 10*3/uL (ref 0.7–4.0)
MCH: 27.2 pg (ref 26.0–34.0)
MCHC: 32.3 g/dL (ref 30.0–36.0)
MCV: 84.3 fL (ref 80.0–100.0)
Monocytes Absolute: 0.6 10*3/uL (ref 0.1–1.0)
Monocytes Relative: 15 %
Neutro Abs: 2.3 10*3/uL (ref 1.7–7.7)
Neutrophils Relative %: 61 %
Platelet Count: 270 10*3/uL (ref 150–400)
RBC: 4.15 MIL/uL — ABNORMAL LOW (ref 4.22–5.81)
RDW: 14.5 % (ref 11.5–15.5)
WBC Count: 3.7 10*3/uL — ABNORMAL LOW (ref 4.0–10.5)
nRBC: 0 % (ref 0.0–0.2)

## 2021-11-18 MED ORDER — ACETAMINOPHEN 325 MG PO TABS
650.0000 mg | ORAL_TABLET | Freq: Once | ORAL | Status: AC
  Administered 2021-11-18: 650 mg via ORAL
  Filled 2021-11-18: qty 2

## 2021-11-18 MED ORDER — SODIUM CHLORIDE 0.9% FLUSH
10.0000 mL | INTRAVENOUS | Status: DC | PRN
  Administered 2021-11-18: 10 mL

## 2021-11-18 MED ORDER — SODIUM CHLORIDE 0.9 % IV SOLN: Freq: Once | INTRAVENOUS | Status: AC

## 2021-11-18 MED ORDER — HEPARIN SOD (PORK) LOCK FLUSH 100 UNIT/ML IV SOLN
500.0000 [IU] | Freq: Once | INTRAVENOUS | Status: AC | PRN
  Administered 2021-11-18: 500 [IU]

## 2021-11-18 MED ORDER — HYDROCORTISONE (PERIANAL) 2.5 % EX CREA: TOPICAL_CREAM | CUTANEOUS | 1 refills | Status: DC

## 2021-11-18 MED ORDER — SODIUM CHLORIDE 0.9 % IV SOLN
1200.0000 mg | Freq: Once | INTRAVENOUS | Status: AC
  Administered 2021-11-18: 1200 mg via INTRAVENOUS
  Filled 2021-11-18: qty 20

## 2021-11-18 MED ORDER — FAMOTIDINE 20 MG PO TABS
20.0000 mg | ORAL_TABLET | Freq: Once | ORAL | Status: AC
  Administered 2021-11-18: 20 mg via ORAL
  Filled 2021-11-18: qty 1

## 2021-11-18 MED ORDER — DIPHENHYDRAMINE HCL 25 MG PO CAPS
25.0000 mg | ORAL_CAPSULE | Freq: Once | ORAL | Status: AC
  Administered 2021-11-18: 25 mg via ORAL
  Filled 2021-11-18: qty 1

## 2021-11-18 NOTE — Progress Notes (Signed)
Referring Provider: [name], MD Primary Care Physician:  [name], MD   Chief complaint: Rectal bleeding   IMPRESSION:  Rectal bleeding on Eliquis: Continues to have rectal bleeding despite one banding session. Will give another trial of topical steroids at the patient's request.  History of longstanding GERD with LA Class D reflux esophagitis on EGD 2021.  PLAN: - Continue esomeprazole 40 mg QD - Continue high fiber diet - Continue hydration and physical activity - Continue daily stool bulking agent with psyllium or methylcellulose - Will prescribe Anusol HC cream BID for 7 days - Will give some Recticare samples today - Patient declined hemorrhoidal banding today - Patient can return PRN in the future with his primary gastroenterologist Dr. Tarri Glenn   HPI: David Berry. is a 56 y.o. male returns today in follow-up for hemorrhoids.  Patient was last seen on 10/16/21 for hemorrhoidal banding.   Since his last visit, he states that he had some discomfort after the last banding and would prefer to try the steroid cream instead of another banding session. He does not think that the last banding session helped significantly. He is drinking water and getting adequate physical activity. Also taking daily fiber supplement as well as a stool softener.   Endoscopic history: - Screening colonoscopy 07/09/2019 showed internal hemorrhoids and was otherwise normal - EGD for reflux 07/09/2019 showed LA class D reflux esophagitis, benign-appearing stenosis located 40 cm from the incisors that could not be traversed due to resistance.  A small rent developed with attempted endoscopic evaluation.  Abdominal imaging: - Barium swallow 07/19/2019 showed a mild stricture at C6-7, successful but slow passage of a barium tablet through this area, hiatal hernia with moderate reflux, and a moderate stricture above the hiatal hernia - CT of the chest, abdomen, and pelvis 11/08/2020  showed generalized mild smooth wall thickening throughout the thoracic esophagus unchanged.  Right paratracheal soft tissue enlargement.  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  No new or progressive metastatic disease.   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[place])    pt denies this   GERD (gastroesophageal reflux disease)    Lung cancer ([place]) dx'd 01/2019   Peripheral vascular disease ([place])    blood clot in neck Sept 12, 2020    Past Surgical History:  Procedure Laterality Date   APPENDECTOMY     teenager   INGUINAL HERNIA REPAIR Right 2016, 2017   [place], [facility]-removed mesh   IR IMAGING GUIDED PORT INSERTION  04/26/2019   TOOTH EXTRACTION N/A 03/14/2020   Procedure: DENTAL RESTORATION/EXTRACTIONS;  Surgeon: Diona Browner, DDS;  Location: [facility];  Service: Oral Surgery;  Laterality: N/A;    Current Outpatient Medications  Medication Sig Dispense Refill   Cyanocobalamin (VITAMIN B 12 PO) Take 2,500 mcg by mouth daily.     ELIQUIS 5 MG TABS tablet TAKE 1 TABLET(5 MG) BY MOUTH TWICE DAILY 60 tablet 11   esomeprazole (NEXIUM) 40 MG capsule Take 1 capsule (40 mg total) by mouth daily. 90 capsule 2   hydrocortisone (ANUSOL-HC) 2.5 % rectal cream Place 1 application. rectally 2 (two) times daily. 30 g 1   lidocaine-prilocaine (EMLA) cream Apply 1 application. topically as needed. 30 g 0   propranolol (INDERAL) 20 MG tablet TAKE 1 TABLET(20  MG) BY MOUTH TWICE DAILY 60 tablet 5   sildenafil (VIAGRA) 50 MG tablet Take 1 tablet (50 mg total) by mouth daily as needed for erectile dysfunction. Take 30 mins to 4 hours prior to sexual activity 30 tablet 1   No current facility-administered medications for this visit.    Allergies as of 11/18/2021   (No Known Allergies)    Family History  Problem Relation Age of Onset   Prostate  cancer Father    Colon cancer Neg Hx    Stomach cancer Neg Hx    Esophageal cancer Neg Hx      Physical Exam: Vitals:   11/18/21 1524  BP: 122/78  Pulse: 80  SpO2: 96%   Gen: Awake, alert, and oriented, and well communicative. HEENT: EOMI, non-icteric sclera, NCAT, MMM  Neck: Normal movement of head and neck  Pulm: No labored breathing, speaking in full sentences without conversational dyspnea  Rectal: Several columns of grade 3 internal hemorrhoids Derm: No apparent lesions or bruising in visible field  MS: Moves all visible extremities without noticeable abnormality  Psych: Pleasant, cooperative, normal speech, thought processing seemingly intact     Christia Reading, MD 11/18/2021, 4:00 PM

## 2021-11-18 NOTE — Patient Instructions (Signed)
[facility]COL[facility]  Discharge Instructions: Thank you for choosing [facility] to provide your oncology and hematology care.   If you have a lab appointment with the [facility], please go directly to the [facility] and check in at the registration area.   Wear comfortable clothing and clothing appropriate for easy access to any Portacath or PICC line.   We strive to give you quality time with your provider. You may need to reschedule your appointment if you arrive late (15 or more minutes).  Arriving late affects you and other patients whose appointments are after yours.  Also, if you miss three or more appointments without notifying the office, you may be dismissed from the clinic at the provider's discretion.      For prescription refill requests, have your pharmacy contact our office and allow 72 hours for refills to be completed.    Today you received the following chemotherapy and/or immunotherapy agents: Tecentriq       To help prevent nausea and vomiting after your treatment, we encourage you to take your nausea medication as directed.  BELOW ARE SYMPTOMS THAT SHOULD BE REPORTED IMMEDIATELY: *FEVER GREATER THAN 100.4 F (38 C) OR HIGHER *CHILLS OR SWEATING *NAUSEA AND VOMITING THAT IS NOT CONTROLLED WITH YOUR NAUSEA MEDICATION *UNUSUAL SHORTNESS OF BREATH *UNUSUAL BRUISING OR BLEEDING *URINARY PROBLEMS (pain or burning when urinating, or frequent urination) *BOWEL PROBLEMS (unusual diarrhea, constipation, pain near the anus) TENDERNESS IN MOUTH AND THROAT WITH OR WITHOUT PRESENCE OF ULCERS (sore throat, sores in mouth, or a toothache) UNUSUAL RASH, SWELLING OR PAIN  UNUSUAL VAGINAL DISCHARGE OR ITCHING   Items with * indicate a potential emergency and should be followed up as soon as possible or go to the Emergency Department if any problems should occur.  Please show the CHEMOTHERAPY ALERT CARD or IMMUNOTHERAPY ALERT CARD at check-in to  the Emergency Department and triage nurse.  Should you have questions after your visit or need to cancel or reschedule your appointment, please contact [facility]  Dept: [phone]  and follow the prompts.  Office hours are 8:00 a.m. to 4:30 p.m. Monday - Friday. Please note that voicemails left after 4:00 p.m. may not be returned until the following business day.  We are closed weekends and major holidays. You have access to a nurse at all times for urgent questions. Please call the main number to the clinic Dept: [phone] and follow the prompts.   For any non-urgent questions, you may also contact your provider using MyChart. We now offer e-Visits for anyone 69 and older to request care online for non-urgent symptoms. For details visit [email or web address].   Also download the MyChart app! Go to the app store, search "MyChart", open the app, select [facility], and log in with your MyChart username and password.  Due to Covid, a mask is required upon entering the hospital/clinic. If you do not have a mask, one will be given to you upon arrival. For doctor visits, patients may have 1 support person aged 28 or older with them. For treatment visits, patients cannot have anyone with them due to current Covid guidelines and our immunocompromised population.

## 2021-11-18 NOTE — Patient Instructions (Addendum)
If you are age 55 or older, your body mass index should be between 23-30. Your Body mass index is 18.88 kg/m. If this is out of the aforementioned range listed, please consider follow up with your Primary Care Provider.  If you are age 94 or younger, your body mass index should be between 19-25. Your Body mass index is 18.88 kg/m. If this is out of the aformentioned range listed, please consider follow up with your Primary Care Provider.   ________________________________________________________  The [facility] providers would like to encourage you to use [facility] to communicate with providers for non-urgent requests or questions.  Due to long hold times on the telephone, sending your provider a message by [facility] may be a faster and more efficient way to get a response.  Please allow 48 business hours for a response.  Please remember that this is for non-urgent requests.  _______________________________________________________  We have sent Anusol to the pharmacy use this twice a day for 7 days.   It was a pleasure to see you today!  Thank you for trusting me with your gastrointestinal care!

## 2021-11-30 ENCOUNTER — Other Ambulatory Visit: Payer: Self-pay

## 2021-12-07 ENCOUNTER — Other Ambulatory Visit: Payer: Self-pay | Admitting: *Deleted

## 2021-12-07 DIAGNOSIS — C3491 Malignant neoplasm of unspecified part of right bronchus or lung: Secondary | ICD-10-CM

## 2021-12-09 ENCOUNTER — Inpatient Hospital Stay: Payer: Medicare Other | Attending: Physician Assistant | Admitting: Hematology

## 2021-12-09 ENCOUNTER — Inpatient Hospital Stay: Payer: Medicare Other

## 2021-12-09 ENCOUNTER — Other Ambulatory Visit: Payer: Self-pay

## 2021-12-09 VITALS — BP 94/62 | HR 71 | Temp 97.9°F | Resp 20 | Wt 134.4 lb

## 2021-12-09 DIAGNOSIS — Z79899 Other long term (current) drug therapy: Secondary | ICD-10-CM | POA: Insufficient documentation

## 2021-12-09 DIAGNOSIS — Z5112 Encounter for antineoplastic immunotherapy: Secondary | ICD-10-CM | POA: Insufficient documentation

## 2021-12-09 DIAGNOSIS — Z86718 Personal history of other venous thrombosis and embolism: Secondary | ICD-10-CM | POA: Insufficient documentation

## 2021-12-09 DIAGNOSIS — Z87891 Personal history of nicotine dependence: Secondary | ICD-10-CM | POA: Insufficient documentation

## 2021-12-09 DIAGNOSIS — N529 Male erectile dysfunction, unspecified: Secondary | ICD-10-CM | POA: Diagnosis not present

## 2021-12-09 DIAGNOSIS — Z7901 Long term (current) use of anticoagulants: Secondary | ICD-10-CM | POA: Insufficient documentation

## 2021-12-09 DIAGNOSIS — Z7189 Other specified counseling: Secondary | ICD-10-CM

## 2021-12-09 DIAGNOSIS — C3491 Malignant neoplasm of unspecified part of right bronchus or lung: Secondary | ICD-10-CM | POA: Diagnosis present

## 2021-12-09 DIAGNOSIS — Z95828 Presence of other vascular implants and grafts: Secondary | ICD-10-CM

## 2021-12-09 DIAGNOSIS — C771 Secondary and unspecified malignant neoplasm of intrathoracic lymph nodes: Secondary | ICD-10-CM | POA: Insufficient documentation

## 2021-12-09 DIAGNOSIS — D649 Anemia, unspecified: Secondary | ICD-10-CM | POA: Diagnosis not present

## 2021-12-09 LAB — CBC WITH DIFFERENTIAL (CANCER CENTER ONLY)
Abs Immature Granulocytes: 0.01 10*3/uL (ref 0.00–0.07)
Basophils Absolute: 0 10*3/uL (ref 0.0–0.1)
Basophils Relative: 1 %
Eosinophils Absolute: 0 10*3/uL (ref 0.0–0.5)
Eosinophils Relative: 1 %
HCT: 35.5 % — ABNORMAL LOW (ref 39.0–52.0)
Hemoglobin: 11.5 g/dL — ABNORMAL LOW (ref 13.0–17.0)
Immature Granulocytes: 0 %
Lymphocytes Relative: 18 %
Lymphs Abs: 1 10*3/uL (ref 0.7–4.0)
MCH: 26.9 pg (ref 26.0–34.0)
MCHC: 32.4 g/dL (ref 30.0–36.0)
MCV: 82.9 fL (ref 80.0–100.0)
Monocytes Absolute: 0.7 10*3/uL (ref 0.1–1.0)
Monocytes Relative: 12 %
Neutro Abs: 3.7 10*3/uL (ref 1.7–7.7)
Neutrophils Relative %: 68 %
Platelet Count: 253 10*3/uL (ref 150–400)
RBC: 4.28 MIL/uL (ref 4.22–5.81)
RDW: 14.3 % (ref 11.5–15.5)
WBC Count: 5.4 10*3/uL (ref 4.0–10.5)
nRBC: 0 % (ref 0.0–0.2)

## 2021-12-09 LAB — CMP (CANCER CENTER ONLY)
ALT: 9 U/L (ref 0–44)
AST: 14 U/L — ABNORMAL LOW (ref 15–41)
Albumin: 4.3 g/dL (ref 3.5–5.0)
Alkaline Phosphatase: 99 U/L (ref 38–126)
Anion gap: 5 (ref 5–15)
BUN: 7 mg/dL (ref 6–20)
CO2: 27 mmol/L (ref 22–32)
Calcium: 9.3 mg/dL (ref 8.9–10.3)
Chloride: 104 mmol/L (ref 98–111)
Creatinine: 0.91 mg/dL (ref 0.61–1.24)
GFR, Estimated: >60 mL/min (ref >60)
Glucose, Bld: 107 mg/dL — ABNORMAL HIGH (ref 70–99)
Potassium: 3.8 mmol/L (ref 3.5–5.1)
Sodium: 136 mmol/L (ref 135–145)
Total Bilirubin: 0.5 mg/dL (ref 0.3–1.2)
Total Protein: 6.9 g/dL (ref 6.5–8.1)

## 2021-12-09 MED ORDER — DIPHENHYDRAMINE HCL 25 MG PO CAPS
25.0000 mg | ORAL_CAPSULE | Freq: Once | ORAL | Status: AC
  Administered 2021-12-09: 25 mg via ORAL
  Filled 2021-12-09: qty 1

## 2021-12-09 MED ORDER — ACETAMINOPHEN 325 MG PO TABS
650.0000 mg | ORAL_TABLET | Freq: Once | ORAL | Status: AC
  Administered 2021-12-09: 650 mg via ORAL
  Filled 2021-12-09: qty 2

## 2021-12-09 MED ORDER — SODIUM CHLORIDE 0.9% FLUSH
10.0000 mL | INTRAVENOUS | Status: DC | PRN
  Administered 2021-12-09: 10 mL

## 2021-12-09 MED ORDER — SILDENAFIL CITRATE 50 MG PO TABS: 50.0000 mg | ORAL_TABLET | Freq: Every day | ORAL | 1 refills | Status: DC | PRN

## 2021-12-09 MED ORDER — HEPARIN SOD (PORK) LOCK FLUSH 100 UNIT/ML IV SOLN
500.0000 [IU] | Freq: Once | INTRAVENOUS | Status: AC | PRN
  Administered 2021-12-09: 500 [IU]

## 2021-12-09 MED ORDER — SODIUM CHLORIDE 0.9 % IV SOLN: Freq: Once | INTRAVENOUS | Status: AC

## 2021-12-09 MED ORDER — FAMOTIDINE 20 MG PO TABS
20.0000 mg | ORAL_TABLET | Freq: Once | ORAL | Status: AC
  Administered 2021-12-09: 20 mg via ORAL
  Filled 2021-12-09: qty 1

## 2021-12-09 MED ORDER — SODIUM CHLORIDE 0.9 % IV SOLN
1200.0000 mg | Freq: Once | INTRAVENOUS | Status: AC
  Administered 2021-12-09: 1200 mg via INTRAVENOUS
  Filled 2021-12-09: qty 20

## 2021-12-09 NOTE — Patient Instructions (Signed)
[facility] ONCOLOGY  Discharge Instructions: Thank you for choosing [facility] to provide your oncology and hematology care.   If you have a lab appointment with the [facility], please go directly to the [facility] and check in at the registration area.   Wear comfortable clothing and clothing appropriate for easy access to any Portacath or PICC line.   We strive to give you quality time with your provider. You may need to reschedule your appointment if you arrive late (15 or more minutes).  Arriving late affects you and other patients whose appointments are after yours.  Also, if you miss three or more appointments without notifying the office, you may be dismissed from the clinic at the provider's discretion.      For prescription refill requests, have your pharmacy contact our office and allow 72 hours for refills to be completed.    Today you received the following chemotherapy and/or immunotherapy agents: Tecentriq       To help prevent nausea and vomiting after your treatment, we encourage you to take your nausea medication as directed.  BELOW ARE SYMPTOMS THAT SHOULD BE REPORTED IMMEDIATELY: *FEVER GREATER THAN 100.4 F (38 C) OR HIGHER *CHILLS OR SWEATING *NAUSEA AND VOMITING THAT IS NOT CONTROLLED WITH YOUR NAUSEA MEDICATION *UNUSUAL SHORTNESS OF BREATH *UNUSUAL BRUISING OR BLEEDING *URINARY PROBLEMS (pain or burning when urinating, or frequent urination) *BOWEL PROBLEMS (unusual diarrhea, constipation, pain near the anus) TENDERNESS IN MOUTH AND THROAT WITH OR WITHOUT PRESENCE OF ULCERS (sore throat, sores in mouth, or a toothache) UNUSUAL RASH, SWELLING OR PAIN  UNUSUAL VAGINAL DISCHARGE OR ITCHING   Items with * indicate a potential emergency and should be followed up as soon as possible or go to the Emergency Department if any problems should occur.  Please show the CHEMOTHERAPY ALERT CARD or IMMUNOTHERAPY ALERT CARD at check-in to  the Emergency Department and triage nurse.  Should you have questions after your visit or need to cancel or reschedule your appointment, please contact [facility]  Dept: [phone]  and follow the prompts.  Office hours are 8:00 a.m. to 4:30 p.m. Monday - Friday. Please note that voicemails left after 4:00 p.m. may not be returned until the following business day.  We are closed weekends and major holidays. You have access to a nurse at all times for urgent questions. Please call the main number to the clinic Dept: [phone] and follow the prompts.   For any non-urgent questions, you may also contact your provider using MyChart. We now offer e-Visits for anyone 62 and older to request care online for non-urgent symptoms. For details visit [email or web address].   Also download the MyChart app! Go to the app store, search "MyChart", open the app, select [facility], and log in with your MyChart username and password.  Due to Covid, a mask is required upon entering the hospital/clinic. If you do not have a mask, one will be given to you upon arrival. For doctor visits, patients may have 1 support David Berry aged 25 or older with them. For treatment visits, patients cannot have anyone with them due to current Covid guidelines and our immunocompromised population.

## 2021-12-11 ENCOUNTER — Other Ambulatory Visit: Payer: Self-pay

## 2021-12-11 ENCOUNTER — Other Ambulatory Visit: Payer: Self-pay | Admitting: Hematology

## 2021-12-11 ENCOUNTER — Telehealth: Payer: Self-pay | Admitting: Hematology

## 2021-12-11 DIAGNOSIS — C3491 Malignant neoplasm of unspecified part of right bronchus or lung: Secondary | ICD-10-CM

## 2021-12-11 MED ORDER — SILDENAFIL CITRATE 50 MG PO TABS: 50.0000 mg | ORAL_TABLET | Freq: Every day | ORAL | 1 refills | Status: DC | PRN

## 2021-12-11 NOTE — Telephone Encounter (Signed)
Scheduled follow-up appointments per 8/2 los. Patient is aware.

## 2021-12-12 ENCOUNTER — Other Ambulatory Visit: Payer: Self-pay

## 2021-12-14 ENCOUNTER — Other Ambulatory Visit (HOSPITAL_COMMUNITY): Payer: Self-pay | Admitting: Dietician

## 2021-12-14 MED ORDER — BOOST PLUS PO LIQD: ORAL | 0 refills | Status: DC

## 2021-12-14 NOTE — Progress Notes (Signed)
New oral nutrition supplement orders placed per MD.

## 2021-12-15 ENCOUNTER — Encounter: Payer: Self-pay | Admitting: Hematology

## 2021-12-15 NOTE — Progress Notes (Signed)
HEMATOLOGY/ONCOLOGY CLINIC NOTE  Date of Service: 12/09/2021   Patient Care Team: [name], MD as PCP - General (Family Medicine)   CHIEF COMPLAINTS/PURPOSE OF CONSULTATION:  Follow-up for continued evaluation and management of metastatic lung adenocarcinoma  HISTORY OF PRESENTING ILLNESS:  Please see previous notes for details on initial presentation.  Current Treatment:  Atezolizumab maintenance   INTERVAL HISTORY:   David Berry. here for continued evaluation and management of his metastatic lung adenocarcinoma.  He notes no acute new symptoms since his last clinic visit.  He has been working full-time.  Has also been spending time fishing.  No new toxicities from his immunotherapy. Energy levels have been good.  Good appetite. No other acute new focal symptoms. 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[place])    pt denies this   GERD (gastroesophageal reflux disease)    Lung cancer ([place]) dx'd 01/2019   Peripheral vascular disease ([place])    blood clot in neck Sept 12, 2020    SURGICAL HISTORY: Past Surgical History:  Procedure Laterality Date   APPENDECTOMY     teenager   INGUINAL HERNIA REPAIR Right 2016, 2017   [place], 2018 [facility]-removed mesh   IR IMAGING GUIDED PORT INSERTION  04/26/2019   TOOTH EXTRACTION N/A 03/14/2020   Procedure: DENTAL RESTORATION/EXTRACTIONS;  Surgeon: Diona Browner, DDS;  Location: [place];  Service: Oral Surgery;  Laterality: N/A;    SOCIAL HISTORY: Social History   Socioeconomic History   Marital status: Single    Spouse name: Not on file   Number of children: 1   Years of education: GED   Highest education level: Some college, no degree  Occupational History    Comment: fork lift driver  Tobacco Use   Smoking status:  Former    Types: Cigars    Quit date: 01/20/2019    Years since quitting: 2.9   Smokeless tobacco: Never   Tobacco comments:    smokes 3 black and mild cigars per day  Vaping Use   Vaping Use: Never used  Substance and Sexual Activity   Alcohol use: Not Currently   Drug use: No   Sexual activity: Yes  Other Topics Concern   Not on file  Social History Narrative   Lives alone   Caffeine- coffee, 20 oz daily, tea occas   Social Determinants of Health   Financial Resource Strain: Low Risk  (09/29/2020)   Overall Financial Resource Strain (CARDIA)    Difficulty of Paying Living Expenses: Not very hard  Food Insecurity: No Food Insecurity (09/29/2020)   Hunger Vital Sign    Worried About Running Out of Food in the Last Year: Never true    Ran Out of Food in the Last Year: Never true  Transportation Needs: No Transportation Needs (09/29/2020)   PRAPARE - Hydrologist (Medical): No    Lack of Transportation (Non-Medical): No  Physical Activity: Sufficiently Active (09/29/2020)   Exercise Vital Sign    Days of Exercise per Week: 3 days    Minutes of Exercise per Session: 150+ min  Stress: No Stress Concern Present (09/29/2020)   [name]  Feeling of Stress : Not at all  Social Connections: Socially Isolated (09/29/2020)   Social Connection and Isolation Panel [NHANES]    Frequency of Communication with Friends and Family: More than three times a week    Frequency of Social Gatherings with Friends and Family: Once a week    Attends Religious Services: Never    Marine scientist or Organizations: No    Attends Music therapist: Not on file    Marital Status: Never married  Human resources officer Violence: Not on file    FAMILY HISTORY: Family History  Problem Relation Age of Onset   Prostate cancer Father    Colon cancer Neg Hx    Stomach cancer Neg Hx    Esophageal  cancer Neg Hx     ALLERGIES:  has No Known Allergies.  MEDICATIONS:  Current Outpatient Medications  Medication Sig Dispense Refill   Cyanocobalamin (VITAMIN B 12 PO) Take 2,500 mcg by mouth daily.     ELIQUIS 5 MG TABS tablet TAKE 1 TABLET(5 MG) BY MOUTH TWICE DAILY 60 tablet 11   esomeprazole (NEXIUM) 40 MG capsule Take 1 capsule (40 mg total) by mouth daily. 90 capsule 2   hydrocortisone (ANUSOL-HC) 2.5 % rectal cream Use twice a day for 7 days 30 g 1   lactose free nutrition (BOOST PLUS) LIQD Drink one carton (237 ml) Boost Plus/equivalent three times daily in between meals  0   lidocaine-prilocaine (EMLA) cream Apply 1 application. topically as needed. 30 g 0   propranolol (INDERAL) 20 MG tablet TAKE 1 TABLET(20 MG) BY MOUTH TWICE DAILY 60 tablet 5   sildenafil (VIAGRA) 50 MG tablet Take 1 tablet (50 mg total) by mouth daily as needed for erectile dysfunction. Take 30 mins to 4 hours prior to sexual activity 30 tablet 1   No current facility-administered medications for this visit.   REVIEW OF SYSTEMS:   .10 Point review of Systems was done is negative except as noted above.   PHYSICAL EXAMINATION: ECOG FS:1 - Symptomatic but completely ambulatory  Vitals:   12/09/21 0906  BP: 94/62  Pulse: 71  Resp: 20  Temp: 97.9 F (36.6 C)  SpO2: 98%     Wt Readings from Last 3 Encounters:  12/09/21 134 lb 6.4 oz (61 kg)  11/18/21 135 lb 6.4 oz (61.4 kg)  11/18/21 135 lb (61.2 kg)   Body mass index is 18.74 kg/m.   David Berry GENERAL:alert, in no acute distress and comfortable SKIN: no acute rashes, no significant lesions EYES: conjunctiva are pink and non-injected, sclera anicteric OROPHARYNX: MMM, no exudates, no oropharyngeal erythema or ulceration NECK: supple, no JVD LYMPH:  no palpable lymphadenopathy in the cervical, axillary or inguinal regions LUNGS: clear to auscultation b/l with normal respiratory effort HEART: regular rate & rhythm ABDOMEN:  normoactive bowel sounds ,  non tender, not distended. Extremity: no pedal edema PSYCH: alert & oriented x 3 with fluent speech NEURO: no focal motor/sensory deficits  LABORATORY DATA:  I have reviewed the data as listed  .    Latest Ref Rng & Units 12/09/2021    8:36 AM 11/18/2021    9:20 AM 10/28/2021   12:01 PM  CBC  WBC 4.0 - 10.5 K/uL 5.4  3.7  3.8   Hemoglobin 13.0 - 17.0 g/dL 11.5  11.3  11.6   Hematocrit 39.0 - 52.0 % 35.5  35.0  35.6   Platelets 150 - 400 K/uL 253  270  228     .  Latest Ref Rng & Units 12/09/2021    8:36 AM 11/18/2021    9:20 AM 10/28/2021   12:01 PM  CMP  Glucose 70 - 99 mg/dL 107  116  123   BUN 6 - 20 mg/dL $Remove'7  9  9   'HlzjZnR$ Creatinine 0.61 - 1.24 mg/dL 0.91  0.89  0.82   Sodium 135 - 145 mmol/L 136  139  138   Potassium 3.5 - 5.1 mmol/L 3.8  4.0  4.2   Chloride 98 - 111 mmol/L 104  107  107   CO2 22 - 32 mmol/L $RemoveB'27  28  28   'SLqknXBt$ Calcium 8.9 - 10.3 mg/dL 9.3  9.3  9.7   Total Protein 6.5 - 8.1 g/dL 6.9  6.7  6.6   Total Bilirubin 0.3 - 1.2 mg/dL 0.5  0.4  0.3   Alkaline Phos 38 - 126 U/L 99  94  91   AST 15 - 41 U/L $Remo'14  14  17   'IjDFS$ ALT 0 - 44 U/L $Remo'9  10  12    'stFNR$ .No results found for: "LDH"  01/22/2019 [facility]: Tumor Mutational Burden     01/22/2019 PD-L1 Immunohistochemistry Analysis    01/22/2019 Soft Tissue Needle Core Biopsy Surgical Pathology     RADIOGRAPHIC STUDIES: I have personally reviewed the radiological images as listed and agreed with the findings in the report. CT CHEST ABDOMEN PELVIS W CONTRAST  Addendum Date: 09/07/2021   ADDENDUM REPORT: 09/07/2021 12:47 ADDENDUM: body onc Electronically Signed   By: Dahlia Bailiff M.D.   On: 09/07/2021 12:47   Result Date: 09/07/2021 CLINICAL DATA:  History of lung cancer, chemotherapy and radiation complete, immunotherapy ongoing. Linking EXAM: CT CHEST, ABDOMEN, AND PELVIS WITH CONTRAST TECHNIQUE: Multidetector CT imaging of the chest, abdomen and pelvis was performed following the standard protocol during bolus  administration of intravenous contrast. RADIATION DOSE REDUCTION: This exam was performed according to the departmental dose-optimization program which includes automated exposure control, adjustment of the mA and/or kV according to patient size and/or use of iterative reconstruction technique. CONTRAST:  163mL OMNIPAQUE IOHEXOL 300 MG/ML  SOLN COMPARISON:  Multiple priors including most recent PET-CT June 19, 2021 . FINDINGS: CT CHEST FINDINGS Cardiovascular: Accessed left chest Port-A-Cath with tip at the superior cavoatrial junction. Aortic atherosclerosis without abdominal aortic aneurysm. No central pulmonary embolus on this nondedicated study. Normal size heart. No significant pericardial effusion/thickening. Mediastinum/Nodes: Similar post treatment appearance of matted pretracheal, subcarinal and right hilar soft tissue and lymph nodes. Previously indexed pretracheal lymph node measures 11 mm in short axis previously 12 mm. Right hilar lymph node measures 1 cm in short axis, unchanged. No new or enlarging thoracic lymph nodes identified. Diffuse symmetric wall thickening of the esophagus appears similar prior. Lungs/Pleura: Mild to moderate centrilobular and paraseptal emphysema. No significant interval change in the right greater than left suprahilar and paramedian fibrosis and consolidation reflecting post treatment change. No new suspicious pulmonary nodules or masses. No pleural effusion. No pneumothorax. Musculoskeletal: No suspicious chest wall mass. No aggressive lytic or blastic lesion of bone. CT ABDOMEN PELVIS FINDINGS Hepatobiliary: Wedge-shaped hypodense area along the anterior aspect of the liver is similar to prior and located in an area commonly reflecting differential perfusion versus focal fatty infiltration. 1 cm enhancing focus in the peripheral right lobe of the liver on image 65/2 is unchanged from prior and previously evaluated on MRI June 18, 2020 with imaging characteristics  most consistent with a benign FNH or adenoma. No  suspicious hepatic lesion. Gallbladder is unremarkable. No biliary ductal dilation. Pancreas: No pancreatic ductal dilation or evidence of acute inflammation. Spleen: No suspicious splenic lesion. No splenomegaly. Calcified splenic granulomata. Adrenals/Urinary Tract: Stable size of the superior most right adrenal nodule which measures 12 mm on image 64/2, unchanged from prior and was hypermetabolic on prior PET CTs additionally in comparison to prior non contrasted CTs this lesion demonstrates postcontrast enhancement. Inferior most right adrenal nodule measures 1.4 cm on image 69/2, unchanged from prior with density of 5 Hounsfield units on prior non contrasted CT compatible with a benign adenoma. No hydronephrosis. 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 Stomach/Bowel: Radiopaque enteric contrast material traverses the descending colon. Stomach is nondistended limiting evaluation. No pathologic dilation of large or small bowel. No evidence of acute bowel inflammation. Vascular/Lymphatic: No abdominal aortic aneurysm. No pathologically enlarged abdominal or pelvic lymph nodes. Reproductive: Enlarged prostate gland. Other: No significant abdominopelvic free fluid. Musculoskeletal: No aggressive lytic or blastic lesion of bone. High density sclerotic focus in the left iliac bone on image 89/4 is stable over multiple priors dating back to at least January 21, 2019 consistent with a benign etiology such as a bone island. IMPRESSION: 1. Stable size of the mediastinal and right hilar lymph nodes which were mildly metabolic on prior PET-CT. 2. Stable size of the indeterminate right adrenal nodule which was hypermetabolic on prior PET CTs, consider more definitive characterization by adrenal protocol CT with and without  contrast. 3. No significant interval change in the post radiation fibrosis in the paramediastinal, right greater than left, upper lungs. 4. Stable small inferior right adrenal nodule consistent with a benign adrenal adenoma on prior non contrasted PET-CT. 5. No evidence of new or progressive metastatic disease within the chest, abdomen, or pelvis. Electronically Signed: By: Dahlia Bailiff M.D. On: 09/07/2021 12:26    ASSESSMENT & PLAN:   This is a 56 year old male with   1. Metastatic Poorly differentiated lung adenocarcinoma Presented with Large neck mass, mediastinal mass, renal mass, and questionable bone lesions  no brain mets on MRI brain 01/22/2019 PD-L1 Immunohistochemistry Analysis which revealed "Tumor Proportion Score (TPS) 50%" 05/16/2019 PET/CT ([id number]) revealed "Radiation changes in the right hemithorax, as above. Improving mediastinal nodal metastases. Prior bulky right cervical metastases have resolved. No evidence of metastatic disease in the abdomen/pelvis." 07/19/2019 Esophagus Scan ([id number]) revealed "Mild stricture at the C6-7 level likely related to prior esophageal surgery. Barium tablet was slow to pass through this area but did pass through after 1 minutes. Hiatal hernia with moderate gastroesophageal reflux. Moderate stricture above the hiatal hernia likely due to reflux. Barium tablet did not pass this area. There are changes of esophagitis which are likely due to reflux and possibly radiation as well."  10/10/19 of  CT Chest W Contrast ([id number])- no evidence of lung cancer progression at this time. He did have a PET CT scan on 04/13/2021 which was reviewed on the previous visit by Murray Hodgkins.  This showed some borderline FDG avid right paratracheal right hilar and azygoesophageal lymph nodes that were suspicious for possible nodal recurrence however the patient notes that he did have a bad upper respiratory tract infection around the time that he had his PET CT scan and  therefore reactive adenopathy is a possibility as well.    2. 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 Symptomatic hemorrhoids-currently no significant bleeding but have caused some mild iron deficiency anemia.  5. Normocytic anemia -Likely due to underlying malignancy   6. S/pThrombocytosis -- likely due to paraneoplastic effect of tumor and reactive due to inflammation and tissue inflammation from RT.-- now resolved.   7. Nicotine dependence -has quit since cancer diagnosis  8. Iron deficiency - likely due to blood loss from hemorrhoids  9.  Erectile dysfunction-having partial benefits with using Viagra.  PLAN:  -Labs today were discussed in detail with the patient. -CBC and CMP stable -Patient has no notable toxicities from his atezolizumab immunotherapy. -We shall continue his maintenance atezolizumab every 3 weeks until disease progression or significant toxicities. -He requests refill of Viagra which was sent to his pharmacy. Encouraged good p.o. intake and staying active. Patient has no clinical symptoms suggestive of progression of his lung cancer at this time. -Continue anticoagulation for his history of DVT due to cancer   10. Rt hand numbness ? 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   FOLLOW UP: note: Follow-up as per next 3 scheduled appointments for continued maintenance atezolizumab with port flush and labs every 3 weeks. MD visit every 6 weeks   The total time spent in the appointment was 20 minutes*.  All of the patient's questions were answered with apparent satisfaction. The patient knows to call the clinic with any problems, questions or concerns.   Sullivan Lone MD MS AAHIVMS [facility] Hematology/Oncology Physician [facility]  .*Total Encounter Time as defined by the Centers for Medicare and Medicaid Services includes, in addition to the face-to-face time of a patient visit (documented in the note above) non-face-to-face time: obtaining and reviewing outside history, ordering and reviewing medications, tests or procedures, care coordination (communications with other health care professionals or caregivers) and documentation in the medical record.

## 2021-12-29 ENCOUNTER — Other Ambulatory Visit: Payer: Self-pay | Admitting: *Deleted

## 2021-12-29 DIAGNOSIS — C3491 Malignant neoplasm of unspecified part of right bronchus or lung: Secondary | ICD-10-CM

## 2021-12-30 ENCOUNTER — Inpatient Hospital Stay: Payer: Medicare Other

## 2021-12-30 ENCOUNTER — Other Ambulatory Visit: Payer: Self-pay

## 2021-12-30 VITALS — BP 102/71 | HR 67 | Temp 98.2°F | Resp 18 | Wt 132.8 lb

## 2021-12-30 DIAGNOSIS — Z5112 Encounter for antineoplastic immunotherapy: Secondary | ICD-10-CM | POA: Diagnosis not present

## 2021-12-30 DIAGNOSIS — Z7189 Other specified counseling: Secondary | ICD-10-CM

## 2021-12-30 DIAGNOSIS — Z95828 Presence of other vascular implants and grafts: Secondary | ICD-10-CM

## 2021-12-30 DIAGNOSIS — C3491 Malignant neoplasm of unspecified part of right bronchus or lung: Secondary | ICD-10-CM

## 2021-12-30 LAB — CMP (CANCER CENTER ONLY)
ALT: 11 U/L (ref 0–44)
AST: 17 U/L (ref 15–41)
Albumin: 4.2 g/dL (ref 3.5–5.0)
Alkaline Phosphatase: 98 U/L (ref 38–126)
Anion gap: 4 — ABNORMAL LOW (ref 5–15)
BUN: 9 mg/dL (ref 6–20)
CO2: 28 mmol/L (ref 22–32)
Calcium: 9.5 mg/dL (ref 8.9–10.3)
Chloride: 107 mmol/L (ref 98–111)
Creatinine: 0.71 mg/dL (ref 0.61–1.24)
GFR, Estimated: >60 mL/min (ref >60)
Glucose, Bld: 110 mg/dL — ABNORMAL HIGH (ref 70–99)
Potassium: 3.7 mmol/L (ref 3.5–5.1)
Sodium: 139 mmol/L (ref 135–145)
Total Bilirubin: 0.4 mg/dL (ref 0.3–1.2)
Total Protein: 6.6 g/dL (ref 6.5–8.1)

## 2021-12-30 LAB — CBC WITH DIFFERENTIAL (CANCER CENTER ONLY)
Abs Immature Granulocytes: 0.01 10*3/uL (ref 0.00–0.07)
Basophils Absolute: 0 10*3/uL (ref 0.0–0.1)
Basophils Relative: 1 %
Eosinophils Absolute: 0 10*3/uL (ref 0.0–0.5)
Eosinophils Relative: 1 %
HCT: 34.4 % — ABNORMAL LOW (ref 39.0–52.0)
Hemoglobin: 11.2 g/dL — ABNORMAL LOW (ref 13.0–17.0)
Immature Granulocytes: 0 %
Lymphocytes Relative: 22 %
Lymphs Abs: 0.8 10*3/uL (ref 0.7–4.0)
MCH: 26.5 pg (ref 26.0–34.0)
MCHC: 32.6 g/dL (ref 30.0–36.0)
MCV: 81.3 fL (ref 80.0–100.0)
Monocytes Absolute: 0.7 10*3/uL (ref 0.1–1.0)
Monocytes Relative: 19 %
Neutro Abs: 1.9 10*3/uL (ref 1.7–7.7)
Neutrophils Relative %: 57 %
Platelet Count: 257 10*3/uL (ref 150–400)
RBC: 4.23 MIL/uL (ref 4.22–5.81)
RDW: 14.3 % (ref 11.5–15.5)
WBC Count: 3.5 10*3/uL — ABNORMAL LOW (ref 4.0–10.5)
nRBC: 0 % (ref 0.0–0.2)

## 2021-12-30 MED ORDER — ACETAMINOPHEN 325 MG PO TABS
650.0000 mg | ORAL_TABLET | Freq: Once | ORAL | Status: AC
  Administered 2021-12-30: 650 mg via ORAL
  Filled 2021-12-30: qty 2

## 2021-12-30 MED ORDER — SODIUM CHLORIDE 0.9 % IV SOLN: Freq: Once | INTRAVENOUS | Status: AC

## 2021-12-30 MED ORDER — SODIUM CHLORIDE 0.9 % IV SOLN
1200.0000 mg | Freq: Once | INTRAVENOUS | Status: AC
  Administered 2021-12-30: 1200 mg via INTRAVENOUS
  Filled 2021-12-30: qty 20

## 2021-12-30 MED ORDER — HEPARIN SOD (PORK) LOCK FLUSH 100 UNIT/ML IV SOLN
500.0000 [IU] | Freq: Once | INTRAVENOUS | Status: AC | PRN
  Administered 2021-12-30: 500 [IU]

## 2021-12-30 MED ORDER — SODIUM CHLORIDE 0.9% FLUSH
10.0000 mL | INTRAVENOUS | Status: DC | PRN
  Administered 2021-12-30: 10 mL

## 2021-12-30 MED ORDER — FAMOTIDINE 20 MG PO TABS
20.0000 mg | ORAL_TABLET | Freq: Once | ORAL | Status: AC
  Administered 2021-12-30: 20 mg via ORAL
  Filled 2021-12-30: qty 1

## 2021-12-30 MED ORDER — DIPHENHYDRAMINE HCL 25 MG PO CAPS
25.0000 mg | ORAL_CAPSULE | Freq: Once | ORAL | Status: AC
  Administered 2021-12-30: 25 mg via ORAL
  Filled 2021-12-30: qty 1

## 2021-12-31 ENCOUNTER — Emergency Department (HOSPITAL_BASED_OUTPATIENT_CLINIC_OR_DEPARTMENT_OTHER)
Admission: EM | Admit: 2021-12-31 | Discharge: 2021-12-31 | Disposition: A | Discharge status: Home / Self Care | Payer: Medicare Other | Attending: Emergency Medicine | Admitting: Emergency Medicine

## 2021-12-31 ENCOUNTER — Emergency Department (HOSPITAL_BASED_OUTPATIENT_CLINIC_OR_DEPARTMENT_OTHER): Payer: Medicare Other | Admitting: Radiology

## 2021-12-31 ENCOUNTER — Emergency Department (HOSPITAL_BASED_OUTPATIENT_CLINIC_OR_DEPARTMENT_OTHER): Payer: Medicare Other

## 2021-12-31 ENCOUNTER — Encounter (HOSPITAL_BASED_OUTPATIENT_CLINIC_OR_DEPARTMENT_OTHER): Payer: Self-pay | Admitting: Radiology

## 2021-12-31 ENCOUNTER — Other Ambulatory Visit: Payer: Self-pay

## 2021-12-31 DIAGNOSIS — R220 Localized swelling, mass and lump, head: Secondary | ICD-10-CM | POA: Diagnosis not present

## 2021-12-31 DIAGNOSIS — Y92410 Unspecified street and highway as the place of occurrence of the external cause: Secondary | ICD-10-CM | POA: Insufficient documentation

## 2021-12-31 DIAGNOSIS — M47812 Spondylosis without myelopathy or radiculopathy, cervical region: Secondary | ICD-10-CM | POA: Insufficient documentation

## 2021-12-31 DIAGNOSIS — M545 Low back pain, unspecified: Secondary | ICD-10-CM | POA: Diagnosis not present

## 2021-12-31 DIAGNOSIS — Z853 Personal history of malignant neoplasm of breast: Secondary | ICD-10-CM | POA: Diagnosis not present

## 2021-12-31 DIAGNOSIS — S0990XA Unspecified injury of head, initial encounter: Secondary | ICD-10-CM | POA: Insufficient documentation

## 2021-12-31 DIAGNOSIS — M542 Cervicalgia: Secondary | ICD-10-CM | POA: Insufficient documentation

## 2021-12-31 MED ORDER — OXYCODONE HCL 5 MG PO TABS
5.0000 mg | ORAL_TABLET | Freq: Once | ORAL | Status: AC
  Administered 2021-12-31: 5 mg via ORAL
  Filled 2021-12-31: qty 1

## 2021-12-31 MED ORDER — ACETAMINOPHEN 500 MG PO TABS
1000.0000 mg | ORAL_TABLET | Freq: Once | ORAL | Status: AC
  Administered 2021-12-31: 1000 mg via ORAL
  Filled 2021-12-31: qty 2
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
## 2022-01-08 ENCOUNTER — Other Ambulatory Visit: Payer: Self-pay

## 2022-01-18 ENCOUNTER — Other Ambulatory Visit: Payer: Self-pay

## 2022-01-18 DIAGNOSIS — C3491 Malignant neoplasm of unspecified part of right bronchus or lung: Secondary | ICD-10-CM

## 2022-01-20 ENCOUNTER — Other Ambulatory Visit: Payer: Self-pay | Admitting: Hematology

## 2022-01-20 ENCOUNTER — Inpatient Hospital Stay: Payer: Medicare Other

## 2022-01-20 ENCOUNTER — Inpatient Hospital Stay: Payer: Medicare Other | Attending: Physician Assistant

## 2022-01-20 ENCOUNTER — Inpatient Hospital Stay (HOSPITAL_BASED_OUTPATIENT_CLINIC_OR_DEPARTMENT_OTHER): Payer: Medicare Other | Admitting: Hematology

## 2022-01-20 VITALS — BP 110/84 | HR 86 | Temp 98.2°F | Resp 17 | Wt 133.5 lb

## 2022-01-20 DIAGNOSIS — Z86718 Personal history of other venous thrombosis and embolism: Secondary | ICD-10-CM | POA: Diagnosis not present

## 2022-01-20 DIAGNOSIS — D649 Anemia, unspecified: Secondary | ICD-10-CM | POA: Diagnosis not present

## 2022-01-20 DIAGNOSIS — Z95828 Presence of other vascular implants and grafts: Secondary | ICD-10-CM

## 2022-01-20 DIAGNOSIS — Z5112 Encounter for antineoplastic immunotherapy: Secondary | ICD-10-CM | POA: Insufficient documentation

## 2022-01-20 DIAGNOSIS — Z7189 Other specified counseling: Secondary | ICD-10-CM | POA: Diagnosis not present

## 2022-01-20 DIAGNOSIS — Z87891 Personal history of nicotine dependence: Secondary | ICD-10-CM | POA: Insufficient documentation

## 2022-01-20 DIAGNOSIS — N529 Male erectile dysfunction, unspecified: Secondary | ICD-10-CM | POA: Diagnosis not present

## 2022-01-20 DIAGNOSIS — Z7901 Long term (current) use of anticoagulants: Secondary | ICD-10-CM | POA: Insufficient documentation

## 2022-01-20 DIAGNOSIS — Z79899 Other long term (current) drug therapy: Secondary | ICD-10-CM | POA: Insufficient documentation

## 2022-01-20 DIAGNOSIS — C3491 Malignant neoplasm of unspecified part of right bronchus or lung: Secondary | ICD-10-CM

## 2022-01-20 DIAGNOSIS — Z923 Personal history of irradiation: Secondary | ICD-10-CM | POA: Insufficient documentation

## 2022-01-20 DIAGNOSIS — C771 Secondary and unspecified malignant neoplasm of intrathoracic lymph nodes: Secondary | ICD-10-CM | POA: Diagnosis present

## 2022-01-20 LAB — CBC WITH DIFFERENTIAL (CANCER CENTER ONLY)
Abs Immature Granulocytes: 0.01 10*3/uL (ref 0.00–0.07)
Basophils Absolute: 0 10*3/uL (ref 0.0–0.1)
Basophils Relative: 1 %
Eosinophils Absolute: 0 10*3/uL (ref 0.0–0.5)
Eosinophils Relative: 1 %
HCT: 36.6 % — ABNORMAL LOW (ref 39.0–52.0)
Hemoglobin: 11.5 g/dL — ABNORMAL LOW (ref 13.0–17.0)
Immature Granulocytes: 0 %
Lymphocytes Relative: 18 %
Lymphs Abs: 0.9 10*3/uL (ref 0.7–4.0)
MCH: 25.7 pg — ABNORMAL LOW (ref 26.0–34.0)
MCHC: 31.4 g/dL (ref 30.0–36.0)
MCV: 81.7 fL (ref 80.0–100.0)
Monocytes Absolute: 0.7 10*3/uL (ref 0.1–1.0)
Monocytes Relative: 14 %
Neutro Abs: 3.1 10*3/uL (ref 1.7–7.7)
Neutrophils Relative %: 66 %
Platelet Count: 286 10*3/uL (ref 150–400)
RBC: 4.48 MIL/uL (ref 4.22–5.81)
RDW: 14.6 % (ref 11.5–15.5)
WBC Count: 4.7 10*3/uL (ref 4.0–10.5)
nRBC: 0 % (ref 0.0–0.2)

## 2022-01-20 LAB — CMP (CANCER CENTER ONLY)
ALT: 13 U/L (ref 0–44)
AST: 18 U/L (ref 15–41)
Albumin: 4.3 g/dL (ref 3.5–5.0)
Alkaline Phosphatase: 100 U/L (ref 38–126)
Anion gap: 5 (ref 5–15)
BUN: 10 mg/dL (ref 6–20)
CO2: 28 mmol/L (ref 22–32)
Calcium: 10 mg/dL (ref 8.9–10.3)
Chloride: 104 mmol/L (ref 98–111)
Creatinine: 0.81 mg/dL (ref 0.61–1.24)
GFR, Estimated: >60 mL/min (ref >60)
Glucose, Bld: 132 mg/dL — ABNORMAL HIGH (ref 70–99)
Potassium: 4.3 mmol/L (ref 3.5–5.1)
Sodium: 137 mmol/L (ref 135–145)
Total Bilirubin: 0.3 mg/dL (ref 0.3–1.2)
Total Protein: 6.8 g/dL (ref 6.5–8.1)

## 2022-01-20 MED ORDER — DIPHENHYDRAMINE HCL 25 MG PO CAPS
25.0000 mg | ORAL_CAPSULE | Freq: Once | ORAL | Status: AC
  Administered 2022-01-20: 25 mg via ORAL
  Filled 2022-01-20: qty 1

## 2022-01-20 MED ORDER — SODIUM CHLORIDE 0.9% FLUSH
10.0000 mL | INTRAVENOUS | Status: DC | PRN
  Administered 2022-01-20: 10 mL

## 2022-01-20 MED ORDER — ACETAMINOPHEN 325 MG PO TABS
650.0000 mg | ORAL_TABLET | Freq: Once | ORAL | Status: AC
  Administered 2022-01-20: 650 mg via ORAL
  Filled 2022-01-20: qty 2

## 2022-01-20 MED ORDER — SODIUM CHLORIDE 0.9 % IV SOLN
1200.0000 mg | Freq: Once | INTRAVENOUS | Status: AC
  Administered 2022-01-20: 1200 mg via INTRAVENOUS
  Filled 2022-01-20: qty 20

## 2022-01-20 MED ORDER — FAMOTIDINE 20 MG PO TABS
20.0000 mg | ORAL_TABLET | Freq: Once | ORAL | Status: AC
  Administered 2022-01-20: 20 mg via ORAL
  Filled 2022-01-20: qty 1

## 2022-01-20 MED ORDER — SODIUM CHLORIDE 0.9 % IV SOLN: Freq: Once | INTRAVENOUS | Status: AC

## 2022-01-20 MED ORDER — HEPARIN SOD (PORK) LOCK FLUSH 100 UNIT/ML IV SOLN
500.0000 [IU] | Freq: Once | INTRAVENOUS | Status: AC | PRN
  Administered 2022-01-20: 500 [IU]

## 2022-01-20 NOTE — Patient Instructions (Signed)
[facility] ONCOLOGY  Discharge Instructions: Thank you for choosing [facility] to provide your oncology and hematology care.   If you have a lab appointment with the [facility], please go directly to the [facility] and check in at the registration area.   Wear comfortable clothing and clothing appropriate for easy access to any Portacath or PICC line.   We strive to give you quality time with your provider. You may need to reschedule your appointment if you arrive late (15 or more minutes).  Arriving late affects you and other patients whose appointments are after yours.  Also, if you miss three or more appointments without notifying the office, you may be dismissed from the clinic at the provider's discretion.      For prescription refill requests, have your pharmacy contact our office and allow 72 hours for refills to be completed.    Today you received the following chemotherapy and/or immunotherapy agents: Tecentriq      To help prevent nausea and vomiting after your treatment, we encourage you to take your nausea medication as directed.  BELOW ARE SYMPTOMS THAT SHOULD BE REPORTED IMMEDIATELY: *FEVER GREATER THAN 100.4 F (38 C) OR HIGHER *CHILLS OR SWEATING *NAUSEA AND VOMITING THAT IS NOT CONTROLLED WITH YOUR NAUSEA MEDICATION *UNUSUAL SHORTNESS OF BREATH *UNUSUAL BRUISING OR BLEEDING *URINARY PROBLEMS (pain or burning when urinating, or frequent urination) *BOWEL PROBLEMS (unusual diarrhea, constipation, pain near the anus) TENDERNESS IN MOUTH AND THROAT WITH OR WITHOUT PRESENCE OF ULCERS (sore throat, sores in mouth, or a toothache) UNUSUAL RASH, SWELLING OR PAIN  UNUSUAL VAGINAL DISCHARGE OR ITCHING   Items with * indicate a potential emergency and should be followed up as soon as possible or go to the Emergency Department if any problems should occur.  Please show the CHEMOTHERAPY ALERT CARD or IMMUNOTHERAPY ALERT CARD at check-in to  the Emergency Department and triage nurse.  Should you have questions after your visit or need to cancel or reschedule your appointment, please contact [facility]  Dept: [phone]  and follow the prompts.  Office hours are 8:00 a.m. to 4:30 p.m. Monday - Friday. Please note that voicemails left after 4:00 p.m. may not be returned until the following business day.  We are closed weekends and major holidays. You have access to a nurse at all times for urgent questions. Please call the main number to the clinic Dept: [phone] and follow the prompts.   For any non-urgent questions, you may also contact your provider using MyChart. We now offer e-Visits for anyone 69 and older to request care online for non-urgent symptoms. For details visit [email or web address].   Also download the MyChart app! Go to the app store, search "MyChart", open the app, select [facility], and log in with your MyChart username and password.  Masks are optional in the cancer centers. If you would like for your care team to wear a mask while they are taking care of you, please let them know. You may have one support person who is at least 55 years old accompany you for your appointments.

## 2022-01-25 NOTE — Progress Notes (Signed)
HEMATOLOGY/ONCOLOGY CLINIC NOTE  Date of Service: 01/20/2022   Patient Care Team: [name], MD as PCP - General (Family Medicine)   CHIEF COMPLAINTS/PURPOSE OF CONSULTATION:  Follow-up for continued evaluation and management of metastatic lung adenocarcinoma  HISTORY OF PRESENTING ILLNESS:  Please see previous notes for details on initial presentation.  Current Treatment:  Atezolizumab maintenance   INTERVAL HISTORY:  David Berry. Is a 56 y.o. male here for continued evaluation and management of his metastatic lung adenocarcinoma. He reports He is doing well with no new symptoms or concerns.  We discussed getting a CT CAP for further evaluation which he is agreeable to.  No new toxicities from his immunotherapy.  No new changes in appetite.  No new fatigue or change in energy levels. No other acute new focal symptoms.  Labs done today were reviewed in detail with him.  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;  Service: Oral Surgery;  Laterality: N/A;    SOCIAL HISTORY: Social History   Socioeconomic History   Marital status: Single    Spouse name: Not on file   Number of children: 1   Years of education: GED   Highest education level: Some college, no degree  Occupational History     Comment: fork lift driver  Tobacco Use   Smoking status: Former    Types: Cigars    Quit date: 01/20/2019    Years since quitting: 3.0   Smokeless tobacco: Never   Tobacco comments:    smokes 3 black and mild cigars per day  Vaping Use   Vaping Use: Never used  Substance and Sexual Activity   Alcohol use: Not Currently   Drug use: No   Sexual activity: Yes  Other Topics Concern   Not on file  Social History Narrative   Lives alone   Caffeine- coffee, 20 oz daily, tea occas   Social Determinants of Health   Financial Resource Strain: Low Risk  (09/29/2020)   Overall Financial Resource Strain (CARDIA)    Difficulty of Paying Living Expenses: Not very hard  Food Insecurity: No Food Insecurity (09/29/2020)   Hunger Vital Sign    Worried About Running Out of Food in the Last Year: Never true    Ran Out of Food in the Last Year: Never true  Transportation Needs: No Transportation Needs (09/29/2020)   PRAPARE - Hydrologist (Medical): No    Lack of Transportation (Non-Medical): No  Physical Activity: Sufficiently Active (09/29/2020)   Exercise Vital Sign    Days of Exercise per Week: 3 days    Minutes of Exercise per Session: 150+ min  Stress: No Stress Concern Present (09/29/2020)  [place] Questionnaire    Feeling of Stress : Not at all  Social Connections: Socially Isolated (09/29/2020)   Social Connection and Isolation Panel [NHANES]    Frequency of Communication with Friends and Family: More than three times a week    Frequency of Social Gatherings with Friends and Family: Once a week    Attends Religious Services: Never    Marine scientist or Organizations: No    Attends Music therapist: Not on file    Marital Status: Never married  Human resources officer Violence: Not on file    FAMILY HISTORY: Family History  Problem Relation Age of Onset   Prostate cancer Father    Colon  cancer Neg Hx    Stomach cancer Neg Hx    Esophageal cancer Neg Hx     ALLERGIES:  is allergic to atorvastatin calcium.  MEDICATIONS:  Current Outpatient Medications  Medication Sig Dispense Refill   Cyanocobalamin (VITAMIN B 12 PO) Take 2,500 mcg by mouth daily.     ELIQUIS 5 MG TABS tablet TAKE 1 TABLET(5 MG) BY MOUTH TWICE DAILY 60 tablet 11   esomeprazole (NEXIUM) 40 MG capsule Take 1 capsule (40 mg total) by mouth daily. 90 capsule 2   hydrocortisone (ANUSOL-HC) 2.5 % rectal cream Use twice a day for 7 days 30 g 1   lactose free nutrition (BOOST PLUS) LIQD Drink one carton (237 ml) Boost Plus/equivalent three times daily in between meals  0   lidocaine-prilocaine (EMLA) cream Apply 1 application. topically as needed. 30 g 0   propranolol (INDERAL) 20 MG tablet TAKE 1 TABLET(20 MG) BY MOUTH TWICE DAILY 60 tablet 5   rosuvastatin (CRESTOR) 10 MG tablet Take 10 mg by mouth at bedtime.     sildenafil (VIAGRA) 50 MG tablet Take 1 tablet (50 mg total) by mouth daily as needed for erectile dysfunction. Take 30 mins to 4 hours prior to sexual activity 30 tablet 1   No current facility-administered medications for this visit.   REVIEW OF SYSTEMS:   .10 Point review of Systems was done is negative except as noted above.   PHYSICAL EXAMINATION: ECOG FS:1 - Symptomatic but completely ambulatory Vital signs reviewed  Wt Readings from Last 3 Encounters:  01/20/22 133 lb 8 oz (60.6 kg)  12/31/21 132 lb (59.9 kg)  12/30/21 132 lb 12 oz (60.2 kg)   There is no height or weight on file to calculate BMI.   NAD GENERAL:alert, in no acute distress and comfortable SKIN: no acute rashes, no significant lesions EYES: conjunctiva are pink and non-injected, sclera anicteric NECK: supple, no JVD LYMPH:  no palpable lymphadenopathy in the cervical, axillary or inguinal regions LUNGS: clear to auscultation b/l with normal respiratory effort HEART: regular rate & rhythm ABDOMEN:  normoactive bowel  sounds , non tender, not distended. Extremity: no pedal edema PSYCH: alert & oriented x 3 with fluent speech NEURO: no focal motor/sensory deficits  LABORATORY DATA:  I have reviewed the data as listed  .    Latest Ref Rng & Units 01/20/2022    9:21 AM 12/30/2021    9:12 AM 12/09/2021    8:36 AM  CBC  WBC 4.0 - 10.5 K/uL 4.7  3.5  5.4   Hemoglobin 13.0 - 17.0 g/dL 11.5  11.2  11.5   Hematocrit 39.0 - 52.0 % 36.6  34.4  35.5   Platelets 150 - 400 K/uL 286  257  253     .  Latest Ref Rng & Units 01/20/2022    9:21 AM 12/30/2021    9:12 AM 12/09/2021    8:36 AM  CMP  Glucose 70 - 99 mg/dL 132  110  107   BUN 6 - 20 mg/dL '10  9  7   ' Creatinine 0.61 - 1.24 mg/dL 0.81  0.71  0.91   Sodium 135 - 145 mmol/L 137  139  136   Potassium 3.5 - 5.1 mmol/L 4.3  3.7  3.8   Chloride 98 - 111 mmol/L 104  107  104   CO2 22 - 32 mmol/L '28  28  27   ' Calcium 8.9 - 10.3 mg/dL 10.0  9.5  9.3   Total Protein 6.5 - 8.1 g/dL 6.8  6.6  6.9   Total Bilirubin 0.3 - 1.2 mg/dL 0.3  0.4  0.5   Alkaline Phos 38 - 126 U/L 100  98  99   AST 15 - 41 U/L '18  17  14   ' ALT 0 - 44 U/L '13  11  9    ' .No results found for: "LDH"  01/22/2019 [facility]: Tumor Mutational Burden     01/22/2019 PD-L1 Immunohistochemistry Analysis    01/22/2019 Soft Tissue Needle Core Biopsy Surgical Pathology     RADIOGRAPHIC STUDIES: I have personally reviewed the radiological images as listed and agreed with the findings in the report. CT CHEST ABDOMEN PELVIS W CONTRAST  Addendum Date: 09/07/2021   ADDENDUM REPORT: 09/07/2021 12:47 ADDENDUM: body onc Electronically Signed   By: Dahlia Bailiff M.D.   On: 09/07/2021 12:47   Result Date: 09/07/2021 CLINICAL DATA:  History of lung cancer, chemotherapy and radiation complete, immunotherapy ongoing. Linking EXAM: CT CHEST, ABDOMEN, AND PELVIS WITH CONTRAST TECHNIQUE: Multidetector CT imaging of the chest, abdomen and pelvis was performed following the standard protocol during  bolus administration of intravenous contrast. RADIATION DOSE REDUCTION: This exam was performed according to the departmental dose-optimization program which includes automated exposure control, adjustment of the mA and/or kV according to patient size and/or use of iterative reconstruction technique. CONTRAST:  135m OMNIPAQUE IOHEXOL 300 MG/ML  SOLN COMPARISON:  Multiple priors including most recent PET-CT June 19, 2021 . FINDINGS: CT CHEST FINDINGS Cardiovascular: Accessed left chest Port-A-Cath with tip at the superior cavoatrial junction. Aortic atherosclerosis without abdominal aortic aneurysm. No central pulmonary embolus on this nondedicated study. Normal size heart. No significant pericardial effusion/thickening. Mediastinum/Nodes: Similar post treatment appearance of matted pretracheal, subcarinal and right hilar soft tissue and lymph nodes. Previously indexed pretracheal lymph node measures 11 mm in short axis previously 12 mm. Right hilar lymph node measures 1 cm in short axis, unchanged. No new or enlarging thoracic lymph nodes identified. Diffuse symmetric wall thickening of the esophagus appears similar prior. Lungs/Pleura: Mild to moderate centrilobular and paraseptal emphysema. No significant interval change in the right greater than left suprahilar and paramedian fibrosis and consolidation reflecting post treatment change. No new suspicious pulmonary nodules or masses. No pleural effusion. No pneumothorax. Musculoskeletal: No suspicious chest wall mass. No aggressive lytic or blastic lesion of bone. CT ABDOMEN PELVIS FINDINGS Hepatobiliary: Wedge-shaped hypodense area along the anterior aspect of the liver is similar to prior and located in an area commonly reflecting differential perfusion versus focal fatty infiltration. 1 cm enhancing focus in the peripheral right lobe of the liver on image 65/2 is unchanged from prior and previously evaluated on MRI June 18, 2020 with imaging  characteristics most consistent with a benign FNH or adenoma.  No suspicious hepatic lesion. Gallbladder is unremarkable. No biliary ductal dilation. Pancreas: No pancreatic ductal dilation or evidence of acute inflammation. Spleen: No suspicious splenic lesion. No splenomegaly. Calcified splenic granulomata. Adrenals/Urinary Tract: Stable size of the superior most right adrenal nodule which measures 12 mm on image 64/2, unchanged from prior and was hypermetabolic on prior PET CTs additionally in comparison to prior non contrasted CTs this lesion demonstrates postcontrast enhancement. Inferior most right adrenal nodule measures 1.4 cm on image 69/2, unchanged from prior with density of 5 Hounsfield units on prior non contrasted CT compatible with a benign adenoma. No hydronephrosis. 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 Stomach/Bowel: Radiopaque enteric contrast material traverses the descending colon. Stomach is nondistended limiting evaluation. No pathologic dilation of large or small bowel. No evidence of acute bowel inflammation. Vascular/Lymphatic: No abdominal aortic aneurysm. No pathologically enlarged abdominal or pelvic lymph nodes. Reproductive: Enlarged prostate gland. Other: No significant abdominopelvic free fluid. Musculoskeletal: No aggressive lytic or blastic lesion of bone. High density sclerotic focus in the left iliac bone on image 89/4 is stable over multiple priors dating back to at least January 21, 2019 consistent with a benign etiology such as a bone island. IMPRESSION: 1. Stable size of the mediastinal and right hilar lymph nodes which were mildly metabolic on prior PET-CT. 2. Stable size of the indeterminate right adrenal nodule which was hypermetabolic on prior PET CTs, consider more definitive characterization by adrenal protocol CT  with and without contrast. 3. No significant interval change in the post radiation fibrosis in the paramediastinal, right greater than left, upper lungs. 4. Stable small inferior right adrenal nodule consistent with a benign adrenal adenoma on prior non contrasted PET-CT. 5. No evidence of new or progressive metastatic disease within the chest, abdomen, or pelvis. Electronically Signed: By: Dahlia Bailiff M.D. On: 09/07/2021 12:26    ASSESSMENT & PLAN:   This is a 57 year old male with   1. Metastatic Poorly differentiated lung adenocarcinoma Presented with Large neck mass, mediastinal mass, renal mass, and questionable bone lesions  no brain mets on MRI brain 01/22/2019 PD-L1 Immunohistochemistry Analysis which revealed "Tumor Proportion Score (TPS) 50%" 05/16/2019 PET/CT ([id number]) revealed "Radiation changes in the right hemithorax, as above. Improving mediastinal nodal metastases. Prior bulky right cervical metastases have resolved. No evidence of metastatic disease in the abdomen/pelvis." 07/19/2019 Esophagus Scan ([id number]) revealed "Mild stricture at the C6-7 level likely related to prior esophageal surgery. Barium tablet was slow to pass through this area but did pass through after 1 minutes. Hiatal hernia with moderate gastroesophageal reflux. Moderate stricture above the hiatal hernia likely due to reflux. Barium tablet did not pass this area. There are changes of esophagitis which are likely due to reflux and possibly radiation as well."  10/10/19 of  CT Chest W Contrast ([id number])- no evidence of lung cancer progression at this time. He did have a PET CT scan on 04/13/2021 which was reviewed on the previous visit by Murray Hodgkins.  This showed some borderline FDG avid right paratracheal right hilar and azygoesophageal lymph nodes that were suspicious for possible nodal recurrence however the patient notes that he did have a bad upper respiratory tract infection around the time that he had his  PET CT scan and therefore reactive adenopathy is a possibility as well.    2. 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 Symptomatic hemorrhoids-currently no significant bleeding but have caused some mild iron deficiency anemia.  5. Normocytic anemia -Likely due to underlying malignancy   6. S/pThrombocytosis -- likely due to paraneoplastic effect of tumor and reactive due to inflammation and tissue inflammation from RT.-- now resolved.   7. Nicotine dependence -has quit since cancer diagnosis  8. Iron deficiency - likely due to blood loss from hemorrhoids  9.  Erectile dysfunction-having partial benefits with using Viagra.  PLAN:  -Labs today were discussed in detail with the patient. -CBC and CMP stable -Patient has no notable toxicities from his atezolizumab immunotherapy. -We shall continue his maintenance atezolizumab every 3 weeks until disease progression or significant toxicities. Encouraged good p.o. intake and staying active. Patient has no clinical symptoms suggestive of progression of his lung cancer at this time. -Continue anticoagulation for his history of DVT due to cancer -Ordered CT CAP for further evaluation.   10. Rt hand numbness ? 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   FOLLOW UP: -Follow-up as per next 3 scheduled appointments for continued maintenance atezolizumab with port flush and labs every 3 weeks. -MD visit every 6 weeks -CT CAP in 5-6 weeks   The total time spent in the appointment was 25 minutes*.  All of the patient's questions were answered with apparent satisfaction. The patient knows to call the clinic with any problems, questions or concerns.   Sullivan Lone MD MS AAHIVMS [facility] Hematology/Oncology  Physician [facility]  .*Total Encounter Time as defined by the Centers for Medicare and Medicaid Services includes, in addition to the face-to-face time of a patient visit (documented in the note above) non-face-to-face time: obtaining and reviewing outside history, ordering and reviewing medications, tests or procedures, care coordination (communications with other health care professionals or caregivers) and documentation in the medical record.  I, Melene Muller, am acting as scribe for Dr. Sullivan Lone, MD.

## 2022-01-26 ENCOUNTER — Encounter: Payer: Self-pay | Admitting: Hematology

## 2022-01-28 ENCOUNTER — Other Ambulatory Visit: Payer: Self-pay

## 2022-01-30 ENCOUNTER — Other Ambulatory Visit: Payer: Self-pay

## 2022-02-10 ENCOUNTER — Inpatient Hospital Stay: Payer: Medicare Other | Attending: Physician Assistant

## 2022-02-10 ENCOUNTER — Inpatient Hospital Stay: Payer: Medicare Other

## 2022-02-10 VITALS — BP 100/70 | HR 75 | Temp 98.0°F | Resp 18 | Wt 133.1 lb

## 2022-02-10 DIAGNOSIS — C3491 Malignant neoplasm of unspecified part of right bronchus or lung: Secondary | ICD-10-CM | POA: Insufficient documentation

## 2022-02-10 DIAGNOSIS — Z79899 Other long term (current) drug therapy: Secondary | ICD-10-CM | POA: Diagnosis not present

## 2022-02-10 DIAGNOSIS — Z87891 Personal history of nicotine dependence: Secondary | ICD-10-CM | POA: Diagnosis not present

## 2022-02-10 DIAGNOSIS — Z7901 Long term (current) use of anticoagulants: Secondary | ICD-10-CM | POA: Diagnosis not present

## 2022-02-10 DIAGNOSIS — C771 Secondary and unspecified malignant neoplasm of intrathoracic lymph nodes: Secondary | ICD-10-CM | POA: Diagnosis present

## 2022-02-10 DIAGNOSIS — Z7189 Other specified counseling: Secondary | ICD-10-CM

## 2022-02-10 DIAGNOSIS — Z5112 Encounter for antineoplastic immunotherapy: Secondary | ICD-10-CM | POA: Diagnosis present

## 2022-02-10 DIAGNOSIS — Z86718 Personal history of other venous thrombosis and embolism: Secondary | ICD-10-CM | POA: Diagnosis not present

## 2022-02-10 DIAGNOSIS — D509 Iron deficiency anemia, unspecified: Secondary | ICD-10-CM | POA: Diagnosis not present

## 2022-02-10 DIAGNOSIS — Z95828 Presence of other vascular implants and grafts: Secondary | ICD-10-CM

## 2022-02-10 LAB — CMP (CANCER CENTER ONLY)
ALT: 13 U/L (ref 0–44)
AST: 17 U/L (ref 15–41)
Albumin: 4.1 g/dL (ref 3.5–5.0)
Alkaline Phosphatase: 94 U/L (ref 38–126)
Anion gap: 5 (ref 5–15)
BUN: 7 mg/dL (ref 6–20)
CO2: 28 mmol/L (ref 22–32)
Calcium: 9.1 mg/dL (ref 8.9–10.3)
Chloride: 104 mmol/L (ref 98–111)
Creatinine: 0.92 mg/dL (ref 0.61–1.24)
GFR, Estimated: >60 mL/min (ref >60)
Glucose, Bld: 166 mg/dL — ABNORMAL HIGH (ref 70–99)
Potassium: 3.9 mmol/L (ref 3.5–5.1)
Sodium: 137 mmol/L (ref 135–145)
Total Bilirubin: 0.4 mg/dL (ref 0.3–1.2)
Total Protein: 6.4 g/dL — ABNORMAL LOW (ref 6.5–8.1)

## 2022-02-10 LAB — CBC WITH DIFFERENTIAL (CANCER CENTER ONLY)
Abs Immature Granulocytes: 0 10*3/uL (ref 0.00–0.07)
Basophils Absolute: 0 10*3/uL (ref 0.0–0.1)
Basophils Relative: 1 %
Eosinophils Absolute: 0 10*3/uL (ref 0.0–0.5)
Eosinophils Relative: 1 %
HCT: 33.8 % — ABNORMAL LOW (ref 39.0–52.0)
Hemoglobin: 10.9 g/dL — ABNORMAL LOW (ref 13.0–17.0)
Immature Granulocytes: 0 %
Lymphocytes Relative: 18 %
Lymphs Abs: 0.8 10*3/uL (ref 0.7–4.0)
MCH: 26.3 pg (ref 26.0–34.0)
MCHC: 32.2 g/dL (ref 30.0–36.0)
MCV: 81.4 fL (ref 80.0–100.0)
Monocytes Absolute: 0.7 10*3/uL (ref 0.1–1.0)
Monocytes Relative: 15 %
Neutro Abs: 3 10*3/uL (ref 1.7–7.7)
Neutrophils Relative %: 65 %
Platelet Count: 272 10*3/uL (ref 150–400)
RBC: 4.15 MIL/uL — ABNORMAL LOW (ref 4.22–5.81)
RDW: 14.6 % (ref 11.5–15.5)
WBC Count: 4.5 10*3/uL (ref 4.0–10.5)
nRBC: 0 % (ref 0.0–0.2)

## 2022-02-10 LAB — TSH: TSH: 0.99 u[IU]/mL (ref 0.350–4.500)

## 2022-02-10 MED ORDER — HEPARIN SOD (PORK) LOCK FLUSH 100 UNIT/ML IV SOLN
500.0000 [IU] | Freq: Once | INTRAVENOUS | Status: AC | PRN
  Administered 2022-02-10: 500 [IU]

## 2022-02-10 MED ORDER — SODIUM CHLORIDE 0.9% FLUSH
10.0000 mL | INTRAVENOUS | Status: DC | PRN
  Administered 2022-02-10: 10 mL

## 2022-02-10 MED ORDER — DIPHENHYDRAMINE HCL 25 MG PO CAPS
25.0000 mg | ORAL_CAPSULE | Freq: Once | ORAL | Status: AC
  Administered 2022-02-10: 25 mg via ORAL
  Filled 2022-02-10: qty 1

## 2022-02-10 MED ORDER — ACETAMINOPHEN 325 MG PO TABS
650.0000 mg | ORAL_TABLET | Freq: Once | ORAL | Status: AC
  Administered 2022-02-10: 650 mg via ORAL
  Filled 2022-02-10: qty 2

## 2022-02-10 MED ORDER — SODIUM CHLORIDE 0.9 % IV SOLN
1200.0000 mg | Freq: Once | INTRAVENOUS | Status: AC
  Administered 2022-02-10: 1200 mg via INTRAVENOUS
  Filled 2022-02-10: qty 20

## 2022-02-10 MED ORDER — SODIUM CHLORIDE 0.9 % IV SOLN: Freq: Once | INTRAVENOUS | Status: AC

## 2022-02-10 MED ORDER — FAMOTIDINE 20 MG PO TABS
20.0000 mg | ORAL_TABLET | Freq: Once | ORAL | Status: AC
  Administered 2022-02-10: 20 mg via ORAL
  Filled 2022-02-10: qty 1

## 2022-02-10 NOTE — Patient Instructions (Signed)
[facility] ONCOLOGY  Discharge Instructions: Thank you for choosing [facility] to provide your oncology and hematology care.   If you have a lab appointment with the [facility], please go directly to the [facility] and check in at the registration area.   Wear comfortable clothing and clothing appropriate for easy access to any Portacath or PICC line.   We strive to give you quality time with your provider. You may need to reschedule your appointment if you arrive late (15 or more minutes).  Arriving late affects you and other patients whose appointments are after yours.  Also, if you miss three or more appointments without notifying the office, you may be dismissed from the clinic at the provider's discretion.      For prescription refill requests, have your pharmacy contact our office and allow 72 hours for refills to be completed.    Today you received the following chemotherapy and/or immunotherapy agents: Tecentriq      To help prevent nausea and vomiting after your treatment, we encourage you to take your nausea medication as directed.  BELOW ARE SYMPTOMS THAT SHOULD BE REPORTED IMMEDIATELY: *FEVER GREATER THAN 100.4 F (38 C) OR HIGHER *CHILLS OR SWEATING *NAUSEA AND VOMITING THAT IS NOT CONTROLLED WITH YOUR NAUSEA MEDICATION *UNUSUAL SHORTNESS OF BREATH *UNUSUAL BRUISING OR BLEEDING *URINARY PROBLEMS (pain or burning when urinating, or frequent urination) *BOWEL PROBLEMS (unusual diarrhea, constipation, pain near the anus) TENDERNESS IN MOUTH AND THROAT WITH OR WITHOUT PRESENCE OF ULCERS (sore throat, sores in mouth, or a toothache) UNUSUAL RASH, SWELLING OR PAIN  UNUSUAL VAGINAL DISCHARGE OR ITCHING   Items with * indicate a potential emergency and should be followed up as soon as possible or go to the Emergency Department if any problems should occur.  Please show the CHEMOTHERAPY ALERT CARD or IMMUNOTHERAPY ALERT CARD at check-in to  the Emergency Department and triage nurse.  Should you have questions after your visit or need to cancel or reschedule your appointment, please contact [facility]  Dept: [phone]  and follow the prompts.  Office hours are 8:00 a.m. to 4:30 p.m. Monday - Friday. Please note that voicemails left after 4:00 p.m. may not be returned until the following business day.  We are closed weekends and major holidays. You have access to a nurse at all times for urgent questions. Please call the main number to the clinic Dept: [phone] and follow the prompts.   For any non-urgent questions, you may also contact your provider using MyChart. We now offer e-Visits for anyone 50 and older to request care online for non-urgent symptoms. For details visit [email or web address].   Also download the MyChart app! Go to the app store, search "MyChart", open the app, select [facility], and log in with your MyChart username and password.  Masks are optional in the cancer centers. If you would like for your care team to wear a mask while they are taking care of you, please let them know. You may have one support person who is at least 55 years old accompany you for your appointments.

## 2022-02-25 ENCOUNTER — Ambulatory Visit (HOSPITAL_COMMUNITY)
Admission: RE | Admit: 2022-02-25 | Discharge: 2022-02-25 | Disposition: A | Discharge status: Home / Self Care | Payer: Medicare Other | Source: Ambulatory Visit | Attending: Hematology | Admitting: Hematology

## 2022-02-25 DIAGNOSIS — C3491 Malignant neoplasm of unspecified part of right bronchus or lung: Secondary | ICD-10-CM | POA: Insufficient documentation

## 2022-02-25 MED ORDER — HEPARIN SOD (PORK) LOCK FLUSH 100 UNIT/ML IV SOLN: 500.0000 [IU] | Freq: Once | INTRAVENOUS | Status: DC

## 2022-02-25 MED ORDER — HEPARIN SOD (PORK) LOCK FLUSH 100 UNIT/ML IV SOLN
INTRAVENOUS | Status: AC
  Administered 2022-02-25: 500 [IU]
  Filled 2022-02-25: qty 5

## 2022-02-25 MED ORDER — IOHEXOL 300 MG/ML  SOLN
100.0000 mL | Freq: Once | INTRAMUSCULAR | Status: AC | PRN
  Administered 2022-02-25: 100 mL via INTRAVENOUS

## 2022-03-03 ENCOUNTER — Inpatient Hospital Stay: Payer: Medicare Other

## 2022-03-03 ENCOUNTER — Inpatient Hospital Stay (HOSPITAL_BASED_OUTPATIENT_CLINIC_OR_DEPARTMENT_OTHER): Payer: Medicare Other | Admitting: Hematology

## 2022-03-03 VITALS — BP 99/70 | HR 74 | Temp 98.4°F | Resp 16 | Ht 71.0 in | Wt 134.3 lb

## 2022-03-03 DIAGNOSIS — Z5112 Encounter for antineoplastic immunotherapy: Secondary | ICD-10-CM | POA: Diagnosis not present

## 2022-03-03 DIAGNOSIS — C3491 Malignant neoplasm of unspecified part of right bronchus or lung: Secondary | ICD-10-CM | POA: Diagnosis not present

## 2022-03-03 DIAGNOSIS — Z7189 Other specified counseling: Secondary | ICD-10-CM

## 2022-03-03 DIAGNOSIS — Z95828 Presence of other vascular implants and grafts: Secondary | ICD-10-CM

## 2022-03-03 LAB — CMP (CANCER CENTER ONLY)
ALT: 9 U/L (ref 0–44)
AST: 14 U/L — ABNORMAL LOW (ref 15–41)
Albumin: 4.1 g/dL (ref 3.5–5.0)
Alkaline Phosphatase: 94 U/L (ref 38–126)
Anion gap: 5 (ref 5–15)
BUN: 8 mg/dL (ref 6–20)
CO2: 28 mmol/L (ref 22–32)
Calcium: 9.1 mg/dL (ref 8.9–10.3)
Chloride: 104 mmol/L (ref 98–111)
Creatinine: 0.85 mg/dL (ref 0.61–1.24)
GFR, Estimated: >60 mL/min (ref >60)
Glucose, Bld: 93 mg/dL (ref 70–99)
Potassium: 4.1 mmol/L (ref 3.5–5.1)
Sodium: 137 mmol/L (ref 135–145)
Total Bilirubin: 0.3 mg/dL (ref 0.3–1.2)
Total Protein: 6.7 g/dL (ref 6.5–8.1)

## 2022-03-03 LAB — CBC WITH DIFFERENTIAL (CANCER CENTER ONLY)
Abs Immature Granulocytes: 0 10*3/uL (ref 0.00–0.07)
Basophils Absolute: 0 10*3/uL (ref 0.0–0.1)
Basophils Relative: 1 %
Eosinophils Absolute: 0 10*3/uL (ref 0.0–0.5)
Eosinophils Relative: 1 %
HCT: 34.5 % — ABNORMAL LOW (ref 39.0–52.0)
Hemoglobin: 11 g/dL — ABNORMAL LOW (ref 13.0–17.0)
Immature Granulocytes: 0 %
Lymphocytes Relative: 26 %
Lymphs Abs: 1 10*3/uL (ref 0.7–4.0)
MCH: 25.8 pg — ABNORMAL LOW (ref 26.0–34.0)
MCHC: 31.9 g/dL (ref 30.0–36.0)
MCV: 80.8 fL (ref 80.0–100.0)
Monocytes Absolute: 0.5 10*3/uL (ref 0.1–1.0)
Monocytes Relative: 13 %
Neutro Abs: 2.3 10*3/uL (ref 1.7–7.7)
Neutrophils Relative %: 59 %
Platelet Count: 288 10*3/uL (ref 150–400)
RBC: 4.27 MIL/uL (ref 4.22–5.81)
RDW: 14.8 % (ref 11.5–15.5)
WBC Count: 3.9 10*3/uL — ABNORMAL LOW (ref 4.0–10.5)
nRBC: 0 % (ref 0.0–0.2)

## 2022-03-03 MED ORDER — SODIUM CHLORIDE 0.9% FLUSH
10.0000 mL | INTRAVENOUS | Status: DC | PRN
  Administered 2022-03-03: 10 mL

## 2022-03-03 MED ORDER — HEPARIN SOD (PORK) LOCK FLUSH 100 UNIT/ML IV SOLN
500.0000 [IU] | Freq: Once | INTRAVENOUS | Status: AC | PRN
  Administered 2022-03-03: 500 [IU]

## 2022-03-03 MED ORDER — SODIUM CHLORIDE 0.9 % IV SOLN: Freq: Once | INTRAVENOUS | Status: AC

## 2022-03-03 MED ORDER — DIPHENHYDRAMINE HCL 25 MG PO CAPS
25.0000 mg | ORAL_CAPSULE | Freq: Once | ORAL | Status: AC
  Administered 2022-03-03: 25 mg via ORAL
  Filled 2022-03-03: qty 1

## 2022-03-03 MED ORDER — SODIUM CHLORIDE 0.9 % IV SOLN
1200.0000 mg | Freq: Once | INTRAVENOUS | Status: AC
  Administered 2022-03-03: 1200 mg via INTRAVENOUS
  Filled 2022-03-03: qty 20

## 2022-03-03 MED ORDER — FAMOTIDINE 20 MG PO TABS
20.0000 mg | ORAL_TABLET | Freq: Once | ORAL | Status: AC
  Administered 2022-03-03: 20 mg via ORAL
  Filled 2022-03-03: qty 1

## 2022-03-03 MED ORDER — ACETAMINOPHEN 325 MG PO TABS
650.0000 mg | ORAL_TABLET | Freq: Once | ORAL | Status: AC
  Administered 2022-03-03: 650 mg via ORAL
  Filled 2022-03-03: qty 2

## 2022-03-03 NOTE — Progress Notes (Addendum)
HEMATOLOGY/ONCOLOGY CLINIC NOTE  Date of Service: 03/03/22   Patient Care Team: [name], MD as PCP - General (Family Medicine)   CHIEF COMPLAINTS/PURPOSE OF CONSULTATION:  Follow-up for continued evaluation and management of metastatic lung adenocarcinoma  HISTORY OF PRESENTING ILLNESS:  Please see previous notes for details on initial presentation.  Current Treatment:  Atezolizumab maintenance   INTERVAL HISTORY:  David Berry. Is a 56 y.o. male here for continued evaluation and management of his metastatic lung adenocarcinoma.   Patient was last seen by me on 01/20/2022 and was doing well without any new symptoms.   He is here today to start next cycle of maintenance f Atezolizumab and is doing well.   He reports he has been doing well overall since our last visit. He reports he occasionally get nausea at random times, usually at night, which he thinks is caused by acid reflux. He is requesting an refill on nausea medication.   He denies change in breathing, new infection, leg swelling, abdominal pain, and fever. He denies any toxicities from his atezolizumab immunotherapy.  Labs done today reviewed in details CT CAP from 10/19 discussed with patient.  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[place])    pt denies this   GERD (gastroesophageal reflux disease)    Lung cancer ([place]) dx'd 01/2019   Peripheral vascular disease ([place])    blood clot in neck Sept 12, 2020    SURGICAL HISTORY: Past Surgical History:  Procedure Laterality Date   APPENDECTOMY     teenager   INGUINAL HERNIA REPAIR Right 2016, 2017   [place], 2018 [facility]-removed mesh   IR IMAGING GUIDED PORT INSERTION  04/26/2019   TOOTH EXTRACTION N/A 03/14/2020   Procedure: DENTAL RESTORATION/EXTRACTIONS;  Surgeon: Diona Browner, DDS;  Location: [facility];   Service: Oral Surgery;  Laterality: N/A;    SOCIAL HISTORY: Social History   Socioeconomic History   Marital status: Single    Spouse name: Not on file   Number of children: 1   Years of education: GED   Highest education level: Some college, no degree  Occupational History    Comment: fork lift driver  Tobacco Use   Smoking status: Former    Types: Cigars    Quit date: 01/20/2019    Years since quitting: 3.1   Smokeless tobacco: Never   Tobacco comments:    smokes 3 black and mild cigars per day  Vaping Use   Vaping Use: Never used  Substance and Sexual Activity   Alcohol use: Not Currently   Drug use: No   Sexual activity: Yes  Other Topics Concern   Not on file  Social History Narrative   Lives alone   Caffeine- coffee, 20 oz daily, tea occas   Social Determinants of Health   Financial Resource Strain: Low Risk  (09/29/2020)   Overall Financial Resource Strain (CARDIA)    Difficulty of Paying Living Expenses: Not very hard  Food Insecurity: No Food Insecurity (09/29/2020)   Hunger Vital Sign    Worried About Running Out of Food in the Last Year: Never true    Ran Out of Food in the Last Year: Never true  Transportation Needs: No Transportation Needs (09/29/2020)   PRAPARE - Transportation    Lack of  Transportation (Medical): No    Lack of Transportation (Non-Medical): No  Physical Activity: Sufficiently Active (09/29/2020)   Exercise Vital Sign    Days of Exercise per Week: 3 days    Minutes of Exercise per Session: 150+ min  Stress: No Stress Concern Present (09/29/2020)   [place]    Feeling of Stress : Not at all  Social Connections: Socially Isolated (09/29/2020)   Social Connection and Isolation Panel [NHANES]    Frequency of Communication with Friends and Family: More than three times a week    Frequency of Social Gatherings with Friends and Family: Once a week    Attends Religious  Services: Never    Marine scientist or Organizations: No    Attends Music therapist: Not on file    Marital Status: Never married  Human resources officer Violence: Not on file    FAMILY HISTORY: Family History  Problem Relation Age of Onset   Prostate cancer Father    Colon cancer Neg Hx    Stomach cancer Neg Hx    Esophageal cancer Neg Hx     ALLERGIES:  is allergic to atorvastatin calcium.  MEDICATIONS:  Current Outpatient Medications  Medication Sig Dispense Refill   Cyanocobalamin (VITAMIN B 12 PO) Take 2,500 mcg by mouth daily.     ELIQUIS 5 MG TABS tablet TAKE 1 TABLET(5 MG) BY MOUTH TWICE DAILY 60 tablet 11   esomeprazole (NEXIUM) 40 MG capsule Take 1 capsule (40 mg total) by mouth daily. 90 capsule 2   hydrocortisone (ANUSOL-HC) 2.5 % rectal cream Use twice a day for 7 days 30 g 1   lactose free nutrition (BOOST PLUS) LIQD Drink one carton (237 ml) Boost Plus/equivalent three times daily in between meals  0   lidocaine-prilocaine (EMLA) cream Apply 1 application. topically as needed. 30 g 0   propranolol (INDERAL) 20 MG tablet TAKE 1 TABLET(20 MG) BY MOUTH TWICE DAILY 60 tablet 5   rosuvastatin (CRESTOR) 10 MG tablet Take 10 mg by mouth at bedtime.     sildenafil (VIAGRA) 50 MG tablet Take 1 tablet (50 mg total) by mouth daily as needed for erectile dysfunction. Take 30 mins to 4 hours prior to sexual activity 30 tablet 1   No current facility-administered medications for this visit.   REVIEW OF SYSTEMS:   .10 Point review of Systems was done is negative except as noted above.   PHYSICAL EXAMINATION: ECOG FS:1 - Symptomatic but completely ambulatory Vital signs reviewed  Wt Readings from Last 3 Encounters:  03/03/22 134 lb 4.8 oz (60.9 kg)  02/10/22 133 lb 1.9 oz (60.4 kg)  01/20/22 133 lb 8 oz (60.6 kg)   Body mass index is 18.73 kg/m.   NAD GENERAL:alert, in no acute distress and comfortable SKIN: no acute rashes, no significant lesions EYES:  conjunctiva are pink and non-injected, sclera anicteric NECK: supple, no JVD LYMPH:  no palpable lymphadenopathy in the cervical, axillary or inguinal regions LUNGS: clear to auscultation b/l with normal respiratory effort HEART: regular rate & rhythm ABDOMEN:  normoactive bowel sounds , non tender, not distended. Extremity: no pedal edema PSYCH: alert & oriented x 3 with fluent speech NEURO: no focal motor/sensory deficits  LABORATORY DATA:  I have reviewed the data as listed  .    Latest Ref Rng & Units 03/03/2022   10:29 AM 02/10/2022    9:16 AM 01/20/2022    9:21 AM  CBC  WBC 4.0 - 10.5 K/uL 3.9  4.5  4.7   Hemoglobin 13.0 - 17.0 g/dL 11.0  10.9  11.5   Hematocrit 39.0 - 52.0 % 34.5  33.8  36.6   Platelets 150 - 400 K/uL 288  272  286     .    Latest Ref Rng & Units 03/03/2022   10:29 AM 02/10/2022    9:16 AM 01/20/2022    9:21 AM  CMP  Glucose 70 - 99 mg/dL 93  166  132   BUN 6 - 20 mg/dL 8  7  10   Creatinine 0.61 - 1.24 mg/dL 0.85  0.92  0.81   Sodium 135 - 145 mmol/L 137  137  137   Potassium 3.5 - 5.1 mmol/L 4.1  3.9  4.3   Chloride 98 - 111 mmol/L 104  104  104   CO2 22 - 32 mmol/L 28  28  28   Calcium 8.9 - 10.3 mg/dL 9.1  9.1  10.0   Total Protein 6.5 - 8.1 g/dL 6.7  6.4  6.8   Total Bilirubin 0.3 - 1.2 mg/dL 0.3  0.4  0.3   Alkaline Phos 38 - 126 U/L 94  94  100   AST 15 - 41 U/L 14  17  18   ALT 0 - 44 U/L 9  13  13    .No results found for: "LDH"  01/22/2019 [facility]: Tumor Mutational Burden     01/22/2019 PD-L1 Immunohistochemistry Analysis    01/22/2019 Soft Tissue Needle Core Biopsy Surgical Pathology     RADIOGRAPHIC STUDIES: I have personally reviewed the radiological images as listed and agreed with the findings in the report.   .CT CHEST ABDOMEN PELVIS W CONTRAST  Result Date: 02/27/2022 CLINICAL DATA:  56-year-old male with history of non-small cell lung cancer. Evaluate for treatment response. * Tracking Code: BO * EXAM: CT  CHEST, ABDOMEN, AND PELVIS WITH CONTRAST TECHNIQUE: Multidetector CT imaging of the chest, abdomen and pelvis was performed following the standard protocol during bolus administration of intravenous contrast. RADIATION DOSE REDUCTION: This exam was performed according to the departmental dose-optimization program which includes automated exposure control, adjustment of the mA and/or kV according to patient size and/or use of iterative reconstruction technique. CONTRAST:  100mL OMNIPAQUE IOHEXOL 300 MG/ML  SOLN COMPARISON:  CT of the chest, abdomen and pelvis 09/07/2021. PET-CT 06/19/2021. FINDINGS: CT CHEST FINDINGS Cardiovascular: Heart size is normal. There is no significant pericardial fluid, thickening or pericardial calcification. Atherosclerosis in the thoracic aorta. No definite coronary artery calcifications. Left-sided internal jugular single-lumen Port-A-Cath with tip terminating at the superior cavoatrial junction. Mediastinum/Nodes: No pathologically enlarged mediastinal or hilar lymph nodes. Esophagus is unremarkable in appearance. No axillary lymphadenopathy. Lungs/Pleura: Extensive mass-like areas of architectural distortion with associated areas of traction bronchiectasis and volume loss noted in the paramediastinal aspect of the lungs bilaterally (right greater than left), similar to numerous prior examinations, compatible with areas of chronic postradiation mass-like fibrosis. No other definite suspicious appearing pulmonary nodules or masses are noted. No acute consolidative airspace disease. No pleural effusions. Mild diffuse bronchial wall thickening with mild centrilobular and paraseptal emphysema. Musculoskeletal: There are no aggressive appearing lytic or blastic lesions noted in the visualized portions of the skeleton. CT ABDOMEN PELVIS FINDINGS Hepatobiliary: No suspicious cystic or solid hepatic lesions. No intra or extrahepatic biliary ductal dilatation. Gallbladder is normal in  appearance. Pancreas: No pancreatic mass. No pancreatic ductal dilatation. No pancreatic or peripancreatic fluid collections   or inflammatory changes. Spleen: Unremarkable. Adrenals/Urinary Tract: In the lateral aspect of the interpolar region of the right kidney there is a heterogeneously enhancing 1.6 x 1.4 cm lesion (axial image 66 of series 2), which is minimally increased in size compared to the prior study. Left kidney and bilateral adrenal glands are otherwise unremarkable in appearance. Specifically, previously noted right adrenal nodules are not readily apparent on today's examination. No hydroureteronephrosis. Urinary bladder is normal in appearance. Stomach/Bowel: The appearance of the stomach is normal. No pathologic dilatation of small bowel or colon. The appendix is not confidently identified and may be surgically absent. Regardless, there are no inflammatory changes noted adjacent to the cecum to suggest the presence of an acute appendicitis at this time. Vascular/Lymphatic: Atherosclerosis in the abdominal aorta and pelvic vasculature, without evidence of aneurysm or dissection in the abdominal or pelvic vasculature. No lymphadenopathy noted in the abdomen or pelvis. Reproductive: Prostate gland and seminal vesicles are unremarkable in appearance. Other: No significant volume of ascites.  No pneumoperitoneum. Musculoskeletal: Well-defined sclerotic lesions with narrow zones of transition in the ileum bilaterally comment stable compared to prior examinations dating back to 2020, presumably benign bone islands. There are no aggressive appearing lytic or blastic lesions noted in the visualized portions of the skeleton. IMPRESSION: 1. Stable examination demonstrating extensive areas of postradiation mass-like fibrosis in the lungs bilaterally, with no definitive findings to suggest locally recurrent disease or definite metastatic disease in the chest, abdomen or pelvis. 2. Previously noted right adrenal  nodules are not readily apparent on today's examination. Attention on follow-up studies is recommended. 3. Heterogeneously enhancing lesion in the interpolar region of the right kidney minimally increased in size compared to prior examinations. Prior MRI suggested a potential angiomyolipoma, although slow-growing renal cell carcinoma is difficult to exclude. Continued attention on follow-up studies is recommended. 4. Aortic atherosclerosis. 5. Additional incidental findings, as above. Electronically Signed   By: Daniel  Entrikin M.D.   On: 02/27/2022 12:34     ASSESSMENT & PLAN:   This is a 56-year-old male with   1. Metastatic Poorly differentiated lung adenocarcinoma Presented with Large neck mass, mediastinal mass, renal mass, and questionable bone lesions  no brain mets on MRI brain 01/22/2019 PD-L1 Immunohistochemistry Analysis which revealed "Tumor Proportion Score (TPS) 50%" 05/16/2019 PET/CT ([id number]) revealed "Radiation changes in the right hemithorax, as above. Improving mediastinal nodal metastases. Prior bulky right cervical metastases have resolved. No evidence of metastatic disease in the abdomen/pelvis." 07/19/2019 Esophagus Scan ([id number]) revealed "Mild stricture at the C6-7 level likely related to prior esophageal surgery. Barium tablet was slow to pass through this area but did pass through after 1 minutes. Hiatal hernia with moderate gastroesophageal reflux. Moderate stricture above the hiatal hernia likely due to reflux. Barium tablet did not pass this area. There are changes of esophagitis which are likely due to reflux and possibly radiation as well."  10/10/19 of  CT Chest W Contrast ([id number])- no evidence of lung cancer progression at this time. He did have a PET CT scan on 04/13/2021 which was reviewed on the previous visit by Irene.  This showed some borderline FDG avid right paratracheal right hilar and azygoesophageal lymph nodes that were suspicious for possible  nodal recurrence however the patient notes that he did have a bad upper respiratory tract infection around the time that he had his PET CT scan and therefore reactive adenopathy is a possibility as well.    2. 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 Symptomatic hemorrhoids-currently no significant bleeding but have caused some mild iron deficiency anemia.  5. Normocytic anemia -Likely due to underlying malignancy   6. S/pThrombocytosis -- likely due to paraneoplastic effect of tumor and reactive due to inflammation and tissue inflammation from RT.-- now resolved.   7. Nicotine dependence -has quit since cancer diagnosis  8. Iron deficiency - likely due to blood loss from hemorrhoids  9.  Erectile dysfunction-having partial benefits with using Viagra.  PLAN:  -Labs today were discussed in detail with the patient. -CBC and CMP stable. CT chest /ad/pelvis from 02/25/2022- discussed in details with patient and show no loccaly recurrent or defnitive metastatic disease at this time. Previously noted rt adrenal gland nodules not readily apparent on this examination. -Patient has no notable toxicities from his atezolizumab immunotherapy. -We shall continue his maintenance atezolizumab every 3 weeks until disease progression or significant toxicities. Patient has no clinical symptoms suggestive of progression of his lung cancer at this time. -Continue anticoagulation for his history of DVT due to cancer -Refilled nausea medication.  -sildenafil presscription refilled at patients request  FOLLOW UP: Per integrated scheduling  The total time spent in the appointment was 30 minutes* .  All of the patient's questions were answered with apparent satisfaction. The patient knows to call the clinic with any problems, questions or concerns.   Zettie Cooley,  am acting as a scribe for Sullivan Lone, MD.  Sullivan Lone MD [place] AAHIVMS [facility] Hematology/Oncology Physician [facility]  .*Total Encounter Time as defined by the Centers for Medicare and Medicaid Services includes, in addition to the face-to-face time of a patient visit (documented in the note above) non-face-to-face time: obtaining and reviewing outside history, ordering and reviewing medications, tests or procedures, care coordination (communications with other health care professionals or caregivers) and documentation in the medical record.

## 2022-03-03 NOTE — Patient Instructions (Signed)
[facility] ONCOLOGY  Discharge Instructions: Thank you for choosing [facility] to provide your oncology and hematology care.   If you have a lab appointment with the [facility], please go directly to the [facility] and check in at the registration area.   Wear comfortable clothing and clothing appropriate for easy access to any Portacath or PICC line.   We strive to give you quality time with your provider. You may need to reschedule your appointment if you arrive late (15 or more minutes).  Arriving late affects you and other patients whose appointments are after yours.  Also, if you miss three or more appointments without notifying the office, you may be dismissed from the clinic at the provider's discretion.      For prescription refill requests, have your pharmacy contact our office and allow 72 hours for refills to be completed.    Today you received the following chemotherapy and/or immunotherapy agents: atezolizumab      To help prevent nausea and vomiting after your treatment, we encourage you to take your nausea medication as directed.  BELOW ARE SYMPTOMS THAT SHOULD BE REPORTED IMMEDIATELY: *FEVER GREATER THAN 100.4 F (38 C) OR HIGHER *CHILLS OR SWEATING *NAUSEA AND VOMITING THAT IS NOT CONTROLLED WITH YOUR NAUSEA MEDICATION *UNUSUAL SHORTNESS OF BREATH *UNUSUAL BRUISING OR BLEEDING *URINARY PROBLEMS (pain or burning when urinating, or frequent urination) *BOWEL PROBLEMS (unusual diarrhea, constipation, pain near the anus) TENDERNESS IN MOUTH AND THROAT WITH OR WITHOUT PRESENCE OF ULCERS (sore throat, sores in mouth, or a toothache) UNUSUAL RASH, SWELLING OR PAIN  UNUSUAL VAGINAL DISCHARGE OR ITCHING   Items with * indicate a potential emergency and should be followed up as soon as possible or go to the Emergency Department if any problems should occur.  Please show the CHEMOTHERAPY ALERT CARD or IMMUNOTHERAPY ALERT CARD at check-in  to the Emergency Department and triage nurse.  Should you have questions after your visit or need to cancel or reschedule your appointment, please contact [facility]  Dept: [phone]  and follow the prompts.  Office hours are 8:00 a.m. to 4:30 p.m. Monday - Friday. Please note that voicemails left after 4:00 p.m. may not be returned until the following business day.  We are closed weekends and major holidays. You have access to a nurse at all times for urgent questions. Please call the main number to the clinic Dept: [phone] and follow the prompts.   For any non-urgent questions, you may also contact your provider using MyChart. We now offer e-Visits for anyone 24 and older to request care online for non-urgent symptoms. For details visit [email or web address].   Also download the MyChart app! Go to the app store, search "MyChart", open the app, select [facility], and log in with your MyChart username and password.  Masks are optional in the cancer centers. If you would like for your care team to wear a mask while they are taking care of you, please let them know. You may have one support person who is at least 55 years old accompany you for your appointments.

## 2022-03-09 ENCOUNTER — Encounter: Payer: Self-pay | Admitting: Hematology

## 2022-03-09 MED ORDER — SILDENAFIL CITRATE 50 MG PO TABS: 50.0000 mg | ORAL_TABLET | Freq: Every day | ORAL | 1 refills | Status: DC | PRN

## 2022-03-17 ENCOUNTER — Telehealth: Payer: Self-pay | Admitting: Hematology

## 2022-03-17 NOTE — Telephone Encounter (Signed)
Moved per overbooked infusion room on 11/15, message has been left with pt

## 2022-03-24 ENCOUNTER — Inpatient Hospital Stay: Payer: Medicare Other

## 2022-03-24 ENCOUNTER — Inpatient Hospital Stay: Payer: Medicare Other | Attending: Physician Assistant

## 2022-03-24 VITALS — BP 99/72 | HR 62 | Temp 98.4°F | Resp 16 | Wt 131.5 lb

## 2022-03-24 DIAGNOSIS — Z7901 Long term (current) use of anticoagulants: Secondary | ICD-10-CM | POA: Diagnosis not present

## 2022-03-24 DIAGNOSIS — Z86718 Personal history of other venous thrombosis and embolism: Secondary | ICD-10-CM | POA: Insufficient documentation

## 2022-03-24 DIAGNOSIS — Z79899 Other long term (current) drug therapy: Secondary | ICD-10-CM | POA: Diagnosis not present

## 2022-03-24 DIAGNOSIS — C771 Secondary and unspecified malignant neoplasm of intrathoracic lymph nodes: Secondary | ICD-10-CM | POA: Diagnosis present

## 2022-03-24 DIAGNOSIS — C3491 Malignant neoplasm of unspecified part of right bronchus or lung: Secondary | ICD-10-CM | POA: Insufficient documentation

## 2022-03-24 DIAGNOSIS — Z5112 Encounter for antineoplastic immunotherapy: Secondary | ICD-10-CM | POA: Diagnosis not present

## 2022-03-24 DIAGNOSIS — Z87891 Personal history of nicotine dependence: Secondary | ICD-10-CM | POA: Diagnosis not present

## 2022-03-24 DIAGNOSIS — Z7189 Other specified counseling: Secondary | ICD-10-CM

## 2022-03-24 DIAGNOSIS — D508 Other iron deficiency anemias: Secondary | ICD-10-CM | POA: Diagnosis not present

## 2022-03-24 DIAGNOSIS — Z95828 Presence of other vascular implants and grafts: Secondary | ICD-10-CM

## 2022-03-24 LAB — CMP (CANCER CENTER ONLY)
ALT: 8 U/L (ref 0–44)
AST: 16 U/L (ref 15–41)
Albumin: 4.3 g/dL (ref 3.5–5.0)
Alkaline Phosphatase: 94 U/L (ref 38–126)
Anion gap: 5 (ref 5–15)
BUN: 9 mg/dL (ref 6–20)
CO2: 27 mmol/L (ref 22–32)
Calcium: 9.4 mg/dL (ref 8.9–10.3)
Chloride: 104 mmol/L (ref 98–111)
Creatinine: 0.88 mg/dL (ref 0.61–1.24)
GFR, Estimated: >60 mL/min (ref >60)
Glucose, Bld: 121 mg/dL — ABNORMAL HIGH (ref 70–99)
Potassium: 3.9 mmol/L (ref 3.5–5.1)
Sodium: 136 mmol/L (ref 135–145)
Total Bilirubin: 0.4 mg/dL (ref 0.3–1.2)
Total Protein: 7.1 g/dL (ref 6.5–8.1)

## 2022-03-24 LAB — CBC WITH DIFFERENTIAL (CANCER CENTER ONLY)
Abs Immature Granulocytes: 0 10*3/uL (ref 0.00–0.07)
Basophils Absolute: 0 10*3/uL (ref 0.0–0.1)
Basophils Relative: 1 %
Eosinophils Absolute: 0 10*3/uL (ref 0.0–0.5)
Eosinophils Relative: 1 %
HCT: 34.3 % — ABNORMAL LOW (ref 39.0–52.0)
Hemoglobin: 10.8 g/dL — ABNORMAL LOW (ref 13.0–17.0)
Immature Granulocytes: 0 %
Lymphocytes Relative: 23 %
Lymphs Abs: 0.9 10*3/uL (ref 0.7–4.0)
MCH: 25.1 pg — ABNORMAL LOW (ref 26.0–34.0)
MCHC: 31.5 g/dL (ref 30.0–36.0)
MCV: 79.8 fL — ABNORMAL LOW (ref 80.0–100.0)
Monocytes Absolute: 0.6 10*3/uL (ref 0.1–1.0)
Monocytes Relative: 15 %
Neutro Abs: 2.5 10*3/uL (ref 1.7–7.7)
Neutrophils Relative %: 60 %
Platelet Count: 304 10*3/uL (ref 150–400)
RBC: 4.3 MIL/uL (ref 4.22–5.81)
RDW: 14.8 % (ref 11.5–15.5)
WBC Count: 4.1 10*3/uL (ref 4.0–10.5)
nRBC: 0 % (ref 0.0–0.2)

## 2022-03-24 LAB — TSH: TSH: 1.061 u[IU]/mL (ref 0.350–4.500)

## 2022-03-24 MED ORDER — DIPHENHYDRAMINE HCL 25 MG PO CAPS
25.0000 mg | ORAL_CAPSULE | Freq: Once | ORAL | Status: AC
  Administered 2022-03-24: 25 mg via ORAL
  Filled 2022-03-24: qty 1

## 2022-03-24 MED ORDER — FAMOTIDINE 20 MG PO TABS
20.0000 mg | ORAL_TABLET | Freq: Once | ORAL | Status: AC
  Administered 2022-03-24: 20 mg via ORAL
  Filled 2022-03-24: qty 1

## 2022-03-24 MED ORDER — SODIUM CHLORIDE 0.9% FLUSH
10.0000 mL | INTRAVENOUS | Status: DC | PRN
  Administered 2022-03-24: 10 mL

## 2022-03-24 MED ORDER — SODIUM CHLORIDE 0.9 % IV SOLN: Freq: Once | INTRAVENOUS | Status: AC

## 2022-03-24 MED ORDER — SODIUM CHLORIDE 0.9 % IV SOLN
1200.0000 mg | Freq: Once | INTRAVENOUS | Status: AC
  Administered 2022-03-24: 1200 mg via INTRAVENOUS
  Filled 2022-03-24: qty 20

## 2022-03-24 MED ORDER — HEPARIN SOD (PORK) LOCK FLUSH 100 UNIT/ML IV SOLN
500.0000 [IU] | Freq: Once | INTRAVENOUS | Status: AC | PRN
  Administered 2022-03-24: 500 [IU]

## 2022-03-24 MED ORDER — ACETAMINOPHEN 325 MG PO TABS
650.0000 mg | ORAL_TABLET | Freq: Once | ORAL | Status: AC
  Administered 2022-03-24: 650 mg via ORAL
  Filled 2022-03-24: qty 2

## 2022-03-31 ENCOUNTER — Other Ambulatory Visit: Payer: Self-pay

## 2022-04-01 ENCOUNTER — Other Ambulatory Visit: Payer: Self-pay

## 2022-04-07 ENCOUNTER — Other Ambulatory Visit: Payer: Self-pay

## 2022-04-14 ENCOUNTER — Inpatient Hospital Stay: Payer: Medicare Other | Attending: Physician Assistant | Admitting: Hematology

## 2022-04-14 ENCOUNTER — Inpatient Hospital Stay: Payer: Medicare Other

## 2022-04-14 VITALS — BP 104/65 | HR 68 | Temp 97.8°F | Resp 16 | Ht 71.0 in | Wt 132.7 lb

## 2022-04-14 DIAGNOSIS — Z7189 Other specified counseling: Secondary | ICD-10-CM

## 2022-04-14 DIAGNOSIS — Z86718 Personal history of other venous thrombosis and embolism: Secondary | ICD-10-CM | POA: Diagnosis not present

## 2022-04-14 DIAGNOSIS — D649 Anemia, unspecified: Secondary | ICD-10-CM | POA: Insufficient documentation

## 2022-04-14 DIAGNOSIS — Z95828 Presence of other vascular implants and grafts: Secondary | ICD-10-CM

## 2022-04-14 DIAGNOSIS — N529 Male erectile dysfunction, unspecified: Secondary | ICD-10-CM | POA: Insufficient documentation

## 2022-04-14 DIAGNOSIS — Z79899 Other long term (current) drug therapy: Secondary | ICD-10-CM | POA: Insufficient documentation

## 2022-04-14 DIAGNOSIS — Z87891 Personal history of nicotine dependence: Secondary | ICD-10-CM | POA: Insufficient documentation

## 2022-04-14 DIAGNOSIS — Z5112 Encounter for antineoplastic immunotherapy: Secondary | ICD-10-CM | POA: Insufficient documentation

## 2022-04-14 DIAGNOSIS — C3491 Malignant neoplasm of unspecified part of right bronchus or lung: Secondary | ICD-10-CM

## 2022-04-14 DIAGNOSIS — Z7901 Long term (current) use of anticoagulants: Secondary | ICD-10-CM | POA: Diagnosis not present

## 2022-04-14 DIAGNOSIS — C771 Secondary and unspecified malignant neoplasm of intrathoracic lymph nodes: Secondary | ICD-10-CM | POA: Diagnosis present

## 2022-04-14 LAB — CMP (CANCER CENTER ONLY)
ALT: 8 U/L (ref 0–44)
AST: 16 U/L (ref 15–41)
Albumin: 4.1 g/dL (ref 3.5–5.0)
Alkaline Phosphatase: 99 U/L (ref 38–126)
Anion gap: 5 (ref 5–15)
BUN: 11 mg/dL (ref 6–20)
CO2: 27 mmol/L (ref 22–32)
Calcium: 9.4 mg/dL (ref 8.9–10.3)
Chloride: 105 mmol/L (ref 98–111)
Creatinine: 0.85 mg/dL (ref 0.61–1.24)
GFR, Estimated: >60 mL/min (ref >60)
Glucose, Bld: 104 mg/dL — ABNORMAL HIGH (ref 70–99)
Potassium: 3.9 mmol/L (ref 3.5–5.1)
Sodium: 137 mmol/L (ref 135–145)
Total Bilirubin: 0.4 mg/dL (ref 0.3–1.2)
Total Protein: 6.8 g/dL (ref 6.5–8.1)

## 2022-04-14 LAB — CBC WITH DIFFERENTIAL (CANCER CENTER ONLY)
Abs Immature Granulocytes: 0.01 10*3/uL (ref 0.00–0.07)
Basophils Absolute: 0 10*3/uL (ref 0.0–0.1)
Basophils Relative: 1 %
Eosinophils Absolute: 0.1 10*3/uL (ref 0.0–0.5)
Eosinophils Relative: 1 %
HCT: 34.2 % — ABNORMAL LOW (ref 39.0–52.0)
Hemoglobin: 10.5 g/dL — ABNORMAL LOW (ref 13.0–17.0)
Immature Granulocytes: 0 %
Lymphocytes Relative: 17 %
Lymphs Abs: 0.9 10*3/uL (ref 0.7–4.0)
MCH: 24.7 pg — ABNORMAL LOW (ref 26.0–34.0)
MCHC: 30.7 g/dL (ref 30.0–36.0)
MCV: 80.5 fL (ref 80.0–100.0)
Monocytes Absolute: 0.6 10*3/uL (ref 0.1–1.0)
Monocytes Relative: 12 %
Neutro Abs: 3.7 10*3/uL (ref 1.7–7.7)
Neutrophils Relative %: 69 %
Platelet Count: 329 10*3/uL (ref 150–400)
RBC: 4.25 MIL/uL (ref 4.22–5.81)
RDW: 14.9 % (ref 11.5–15.5)
WBC Count: 5.3 10*3/uL (ref 4.0–10.5)
nRBC: 0 % (ref 0.0–0.2)

## 2022-04-14 MED ORDER — SODIUM CHLORIDE 0.9% FLUSH
10.0000 mL | INTRAVENOUS | Status: DC | PRN
  Administered 2022-04-14: 10 mL

## 2022-04-14 MED ORDER — FAMOTIDINE 20 MG PO TABS
20.0000 mg | ORAL_TABLET | Freq: Once | ORAL | Status: AC
  Administered 2022-04-14: 20 mg via ORAL
  Filled 2022-04-14: qty 1

## 2022-04-14 MED ORDER — LORAZEPAM 0.5 MG PO TABS: 0.5000 mg | ORAL_TABLET | Freq: Three times a day (TID) | ORAL | 0 refills | Status: DC

## 2022-04-14 MED ORDER — HEPARIN SOD (PORK) LOCK FLUSH 100 UNIT/ML IV SOLN
500.0000 [IU] | Freq: Once | INTRAVENOUS | Status: AC | PRN
  Administered 2022-04-14: 500 [IU]

## 2022-04-14 MED ORDER — DIPHENHYDRAMINE HCL 25 MG PO CAPS
25.0000 mg | ORAL_CAPSULE | Freq: Once | ORAL | Status: AC
  Administered 2022-04-14: 25 mg via ORAL
  Filled 2022-04-14: qty 1

## 2022-04-14 MED ORDER — ACETAMINOPHEN 325 MG PO TABS
650.0000 mg | ORAL_TABLET | Freq: Once | ORAL | Status: AC
  Administered 2022-04-14: 650 mg via ORAL
  Filled 2022-04-14: qty 2

## 2022-04-14 MED ORDER — SODIUM CHLORIDE 0.9 % IV SOLN: Freq: Once | INTRAVENOUS | Status: AC

## 2022-04-14 MED ORDER — SODIUM CHLORIDE 0.9 % IV SOLN
1200.0000 mg | Freq: Once | INTRAVENOUS | Status: AC
  Administered 2022-04-14: 1200 mg via INTRAVENOUS
  Filled 2022-04-14: qty 20

## 2022-04-14 NOTE — Patient Instructions (Signed)
[facility] ONCOLOGY  Discharge Instructions: Thank you for choosing [facility] to provide your oncology and hematology care.   If you have a lab appointment with the [facility], please go directly to the [facility] and check in at the registration area.   Wear comfortable clothing and clothing appropriate for easy access to any Portacath or PICC line.   We strive to give you quality time with your provider. You may need to reschedule your appointment if you arrive late (15 or more minutes).  Arriving late affects you and other patients whose appointments are after yours.  Also, if you miss three or more appointments without notifying the office, you may be dismissed from the clinic at the provider's discretion.      For prescription refill requests, have your pharmacy contact our office and allow 72 hours for refills to be completed.    Today you received the following chemotherapy and/or immunotherapy agents: atezolizumab      To help prevent nausea and vomiting after your treatment, we encourage you to take your nausea medication as directed.  BELOW ARE SYMPTOMS THAT SHOULD BE REPORTED IMMEDIATELY: *FEVER GREATER THAN 100.4 F (38 C) OR HIGHER *CHILLS OR SWEATING *NAUSEA AND VOMITING THAT IS NOT CONTROLLED WITH YOUR NAUSEA MEDICATION *UNUSUAL SHORTNESS OF BREATH *UNUSUAL BRUISING OR BLEEDING *URINARY PROBLEMS (pain or burning when urinating, or frequent urination) *BOWEL PROBLEMS (unusual diarrhea, constipation, pain near the anus) TENDERNESS IN MOUTH AND THROAT WITH OR WITHOUT PRESENCE OF ULCERS (sore throat, sores in mouth, or a toothache) UNUSUAL RASH, SWELLING OR PAIN  UNUSUAL VAGINAL DISCHARGE OR ITCHING   Items with * indicate a potential emergency and should be followed up as soon as possible or go to the Emergency Department if any problems should occur.  Please show the CHEMOTHERAPY ALERT CARD or IMMUNOTHERAPY ALERT CARD at check-in  to the Emergency Department and triage nurse.  Should you have questions after your visit or need to cancel or reschedule your appointment, please contact [facility]  Dept: [phone]  and follow the prompts.  Office hours are 8:00 a.m. to 4:30 p.m. Monday - Friday. Please note that voicemails left after 4:00 p.m. may not be returned until the following business day.  We are closed weekends and major holidays. You have access to a nurse at all times for urgent questions. Please call the main number to the clinic Dept: [phone] and follow the prompts.   For any non-urgent questions, you may also contact your provider using MyChart. We now offer e-Visits for anyone 53 and older to request care online for non-urgent symptoms. For details visit [email or web address].   Also download the MyChart app! Go to the app store, search "MyChart", open the app, select [facility], and log in with your MyChart username and password.  Masks are optional in the cancer centers. If you would like for your care team to wear a mask while they are taking care of you, please let them know. You may have one support person who is at least 55 years old accompany you for your appointments.

## 2022-04-14 NOTE — Progress Notes (Signed)
HEMATOLOGY/ONCOLOGY CLINIC NOTE  Date of Service: 04/14/22   Patient Care Team: [name], MD as PCP - General (Family Medicine)   CHIEF COMPLAINTS/PURPOSE OF CONSULTATION:  Follow-up for continued evaluation and management of metastatic lung adenocarcinoma  HISTORY OF PRESENTING ILLNESS:  Please see previous notes for details on initial presentation.  Current Treatment:  Atezolizumab maintenance   INTERVAL HISTORY:  David Berry. Is a 56 y.o. male here for continued evaluation and management of his metastatic lung adenocarcinoma.   He is here today to start cycle 4 of maintenance f Atezolizumab and is doing well.   He was last seen by me on 03/03/22 and he complains of occasional nausea, but is otherwise doing well overall without any medical concerns.  He reports that he is doing well overall without any new medical conditions.  However, he has been experiencing occasional rashes in his bilateral upper UE, lower back and bilateral LE. He attributes his symptoms to cabbage and orange juice consumption. He initially felt tingling prior to the rashes. The rash resolved the following day. He denies any new creams, change in diet, or laundry detergents. He denies any recent vaccines. He had similar skin rashes when he was taking cholesterol medication.  He has mild hemorrhoidal bleeding during every bowel movement. He is using a stool softener.  He started taking Pyribenzamine and Cyanocobalamin for his lower back pain. He is going to be starting PT for his back pain, which he noticed in August.  He occasionally wakes up in middle of night with nausea. He is requesting a refill for esomeprazole to help his nausea symptoms.   He denies any breathing issues, chest pain, SOB, swelling of arms/legs, fever, chills, abdominal pain.  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[place])    pt denies this   GERD (gastroesophageal reflux disease)    Lung cancer ([place]) dx'd 01/2019   Peripheral vascular disease ([place])    blood clot in neck Sept 12, 2020    SURGICAL HISTORY: Past Surgical History:  Procedure Laterality Date   APPENDECTOMY     teenager   INGUINAL HERNIA REPAIR Right 2016, 2017   [place], 2018 [facility]-removed mesh   IR IMAGING GUIDED PORT INSERTION  04/26/2019   TOOTH EXTRACTION N/A 03/14/2020   Procedure: DENTAL RESTORATION/EXTRACTIONS;  Surgeon: Diona Browner, DDS;  Location: [facility];  Service: Oral Surgery;  Laterality: N/A;    SOCIAL HISTORY: Social History   Socioeconomic History   Marital status: Single    Spouse name: Not on file   Number of children: 1   Years of education: GED   Highest education level: Some college, no degree  Occupational History    Comment: fork lift driver  Tobacco Use   Smoking status: Former    Types: Cigars    Quit date: 01/20/2019    Years since quitting: 3.2   Smokeless tobacco: Never   Tobacco comments:    smokes 3 black and mild cigars per day  Vaping Use   Vaping Use: Never used  Substance and Sexual Activity   Alcohol use: Not Currently   Drug use: No   Sexual activity: Yes  Other Topics Concern   Not on file  Social History Narrative   Lives alone   Caffeine-  coffee, 20 oz daily, tea occas   Social Determinants of Health   Financial Resource Strain: Low Risk  (09/29/2020)   Overall Financial Resource Strain (CARDIA)    Difficulty of Paying Living Expenses: Not very hard  Food Insecurity: No Food Insecurity (09/29/2020)   Hunger Vital Sign    Worried About Running Out of Food in the Last Year: Never true    Ran Out of Food in the Last Year: Never true  Transportation Needs: No Transportation Needs (09/29/2020)   PRAPARE - Hydrologist (Medical): No    Lack of Transportation (Non-Medical): No  Physical Activity:  Sufficiently Active (09/29/2020)   Exercise Vital Sign    Days of Exercise per Week: 3 days    Minutes of Exercise per Session: 150+ min  Stress: No Stress Concern Present (09/29/2020)   [place]    Feeling of Stress : Not at all  Social Connections: Socially Isolated (09/29/2020)   Social Connection and Isolation Panel [NHANES]    Frequency of Communication with Friends and Family: More than three times a week    Frequency of Social Gatherings with Friends and Family: Once a week    Attends Religious Services: Never    Marine scientist or Organizations: No    Attends Music therapist: Not on file    Marital Status: Never married  Human resources officer Violence: Not on file    FAMILY HISTORY: Family History  Problem Relation Age of Onset   Prostate cancer Father    Colon cancer Neg Hx    Stomach cancer Neg Hx    Esophageal cancer Neg Hx     ALLERGIES:  is allergic to atorvastatin calcium.  MEDICATIONS:  Current Outpatient Medications  Medication Sig Dispense Refill   Cyanocobalamin (VITAMIN B 12 PO) Take 2,500 mcg by mouth daily.     ELIQUIS 5 MG TABS tablet TAKE 1 TABLET(5 MG) BY MOUTH TWICE DAILY 60 tablet 11   esomeprazole (NEXIUM) 40 MG capsule Take 1 capsule (40 mg total) by mouth daily. 90 capsule 2   hydrocortisone (ANUSOL-HC) 2.5 % rectal cream Use twice a day for 7 days 30 g 1   lactose free nutrition (BOOST PLUS) LIQD Drink one carton (237 ml) Boost Plus/equivalent three times daily in between meals  0   lidocaine-prilocaine (EMLA) cream Apply 1 application. topically as needed. 30 g 0   LIVALO 1 MG TABS Take 1 tablet by mouth at bedtime.     propranolol (INDERAL) 20 MG tablet TAKE 1 TABLET(20 MG) BY MOUTH TWICE DAILY 60 tablet 5   rosuvastatin (CRESTOR) 10 MG tablet Take 10 mg by mouth at bedtime.     sildenafil (VIAGRA) 50 MG tablet Take 1 tablet (50 mg total) by mouth daily as needed  for erectile dysfunction. Take 30 mins to 4 hours prior to sexual activity 30 tablet 1   No current facility-administered medications for this visit.   REVIEW OF SYSTEMS:   .10 Point review of Systems was done is negative except as noted above.   PHYSICAL EXAMINATION: ECOG FS:1 - Symptomatic but completely ambulatory Vital signs reviewed  Wt Readings from Last 3 Encounters:  04/14/22 132 lb 11.2 oz (60.2 kg)  03/24/22 131 lb 8 oz (59.6 kg)  03/03/22 134 lb 4.8 oz (60.9 kg)   Body mass index is 18.51 kg/m.   NAD GENERAL:alert, in no acute distress and comfortable SKIN: no acute  rashes, no significant lesions EYES: conjunctiva are pink and non-injected, sclera anicteric NECK: supple, no JVD LYMPH:  no palpable lymphadenopathy in the cervical, axillary or inguinal regions LUNGS: clear to auscultation b/l with normal respiratory effort HEART: regular rate & rhythm ABDOMEN:  normoactive bowel sounds , non tender, not distended. Extremity: no pedal edema PSYCH: alert & oriented x 3 with fluent speech NEURO: no focal motor/sensory deficits  LABORATORY DATA:  I have reviewed the data as listed  .    Latest Ref Rng & Units 04/14/2022   12:14 PM 03/24/2022   12:10 PM 03/03/2022   10:29 AM  CBC  WBC 4.0 - 10.5 K/uL 5.3  4.1  3.9   Hemoglobin 13.0 - 17.0 g/dL 10.5  10.8  11.0   Hematocrit 39.0 - 52.0 % 34.2  34.3  34.5   Platelets 150 - 400 K/uL 329  304  288     .    Latest Ref Rng & Units 04/14/2022   12:14 PM 03/24/2022   12:10 PM 03/03/2022   10:29 AM  CMP  Glucose 70 - 99 mg/dL 104  121  93   BUN 6 - 20 mg/dL _0 Creatinine 0.61 - 1.24 mg/dL 0.85  0.88  0.85   Sodium 135 - 145 mmol/L 137  136  137   Potassium 3.5 - 5.1 mmol/L 3.9  3.9  4.1   Chloride 98 - 111 mmol/L 105  104  104   CO2 22 - 32 mmol/L _1 Calcium 8.9 - 10.3 mg/dL 9.4  9.4  9.1   Total Protein 6.5 - 8.1 g/dL 6.8  7.1  6.7   Total Bilirubin 0.3 - 1.2 mg/dL 0.4  0.4  0.3   Alkaline  Phos 38 - 126 U/L 99  94  94   AST 15 - 41 U/L _2 ALT 0 - 44 U/L _3 .No results found for: "LDH"  01/22/2019 [facility]: Tumor Mutational Burden     01/22/2019 PD-L1 Immunohistochemistry Analysis    01/22/2019 Soft Tissue Needle Core Biopsy Surgical Pathology     RADIOGRAPHIC STUDIES: I have personally reviewed the radiological images as listed and agreed with the findings in the report.   .No results found.   ASSESSMENT & PLAN:   This is a 56 year old male with   1. Metastatic Poorly differentiated lung adenocarcinoma Presented with Large neck mass, mediastinal mass, renal mass, and questionable bone lesions  no brain mets on MRI brain 01/22/2019 PD-L1 Immunohistochemistry Analysis which revealed "Tumor Proportion Score (TPS) 50%" 05/16/2019 PET/CT ([id number]) revealed "Radiation changes in the right hemithorax, as above. Improving mediastinal nodal metastases. Prior bulky right cervical metastases have resolved. No evidence of metastatic disease in the abdomen/pelvis." 07/19/2019 Esophagus Scan ([id number]) revealed "Mild stricture at the C6-7 level likely related to prior esophageal surgery. Barium tablet was slow to pass through this area but did pass through after 1 minutes. Hiatal hernia with moderate gastroesophageal reflux. Moderate stricture above the hiatal hernia likely due to reflux. Barium tablet did not pass this area. There are changes of esophagitis which are likely due to reflux and possibly radiation as well."  10/10/19 of  CT Chest W Contrast ([id number])- no evidence of lung cancer progression at this time. He did have a PET CT scan on 04/13/2021 which was reviewed on the previous visit by Murray Hodgkins.  This showed  some borderline FDG avid right paratracheal right hilar and azygoesophageal lymph nodes that were suspicious for possible nodal recurrence however the patient notes that he did have a bad upper respiratory tract infection around the  time that he had his PET CT scan and therefore reactive adenopathy is a possibility as well.    2. 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 Symptomatic hemorrhoids-currently no significant bleeding but have caused some mild iron deficiency anemia.  5. Normocytic anemia -Likely due to underlying malignancy   6. S/pThrombocytosis -- likely due to paraneoplastic effect of tumor and reactive due to inflammation and tissue inflammation from RT.-- now resolved.   7. Nicotine dependence -has quit since cancer diagnosis  8. Iron deficiency - likely due to blood loss from hemorrhoids  9.  Erectile dysfunction-having partial benefits with using Viagra.  PLAN:  -discussed lab results form 04/14/2022 with the patient. CBC showed hemoglobin of 10.5 K and hemocrit of 34.2. CMP is stable. -Patient has no notable toxicities from his atezolizumab immunotherapy. -We shall continue his maintenance atezolizumab every 3 weeks until disease progression or significant toxicities. Patient has no clinical symptoms suggestive of progression of his lung cancer at this time. -Continue anticoagulation for his history of DVT due to cancer -Refilled nausea medication.  -advised patient to stay hydrated and consume a healthy diet, including greens and fruit consumption -Iron labs next visit.    FOLLOW UP: Per integrated scheduling  The total time spent in the appointment was 20 minutes* .  All of the patient's questions were answered with apparent satisfaction. The patient knows to call the clinic with any problems, questions or concerns.   Sullivan Lone MD MS AAHIVMS [facility] Hematology/Oncology Physician [facility]  .*Total Encounter Time as defined by the Centers for Medicare and Medicaid Services includes, in addition to the face-to-face time of a  patient visit (documented in the note above) non-face-to-face time: obtaining and reviewing outside history, ordering and reviewing medications, tests or procedures, care coordination (communications with other health care professionals or caregivers) and documentation in the medical record.   I, Cleda Mccreedy, am acting as a Education administrator for Sullivan Lone, MD.  .I have reviewed the above documentation for accuracy and completeness, and I agree with the above. Brunetta Genera MD

## 2022-04-14 NOTE — Progress Notes (Signed)
Patient seen by MD today  Vitals are within treatment parameters.  Labs reviewed: and are within treatment parameters." Not all labs have resulted/ CMP pending  Per physician team, patient is ready for treatment and there are NO modifications to the treatment plan.

## 2022-04-21 ENCOUNTER — Encounter: Payer: Self-pay | Admitting: Hematology

## 2022-04-24 ENCOUNTER — Other Ambulatory Visit: Payer: Self-pay

## 2022-04-24 DIAGNOSIS — C3491 Malignant neoplasm of unspecified part of right bronchus or lung: Secondary | ICD-10-CM

## 2022-04-30 ENCOUNTER — Other Ambulatory Visit: Payer: Self-pay

## 2022-05-04 ENCOUNTER — Other Ambulatory Visit: Payer: Self-pay

## 2022-05-05 ENCOUNTER — Inpatient Hospital Stay: Payer: Medicare Other

## 2022-05-05 ENCOUNTER — Other Ambulatory Visit: Payer: Self-pay

## 2022-05-05 VITALS — BP 101/71 | HR 78 | Temp 98.4°F | Resp 17 | Wt 130.5 lb

## 2022-05-05 DIAGNOSIS — Z7189 Other specified counseling: Secondary | ICD-10-CM

## 2022-05-05 DIAGNOSIS — C3491 Malignant neoplasm of unspecified part of right bronchus or lung: Secondary | ICD-10-CM

## 2022-05-05 DIAGNOSIS — Z95828 Presence of other vascular implants and grafts: Secondary | ICD-10-CM

## 2022-05-05 DIAGNOSIS — Z5112 Encounter for antineoplastic immunotherapy: Secondary | ICD-10-CM | POA: Diagnosis not present

## 2022-05-05 LAB — CMP (CANCER CENTER ONLY)
ALT: 13 U/L (ref 0–44)
AST: 18 U/L (ref 15–41)
Albumin: 4.3 g/dL (ref 3.5–5.0)
Alkaline Phosphatase: 104 U/L (ref 38–126)
Anion gap: 5 (ref 5–15)
BUN: 12 mg/dL (ref 6–20)
CO2: 27 mmol/L (ref 22–32)
Calcium: 9.4 mg/dL (ref 8.9–10.3)
Chloride: 106 mmol/L (ref 98–111)
Creatinine: 0.91 mg/dL (ref 0.61–1.24)
GFR, Estimated: >60 mL/min (ref >60)
Glucose, Bld: 107 mg/dL — ABNORMAL HIGH (ref 70–99)
Potassium: 4 mmol/L (ref 3.5–5.1)
Sodium: 138 mmol/L (ref 135–145)
Total Bilirubin: 0.5 mg/dL (ref 0.3–1.2)
Total Protein: 7.4 g/dL (ref 6.5–8.1)

## 2022-05-05 LAB — IRON AND IRON BINDING CAPACITY (CC-WL,HP ONLY)
Iron: 16 ug/dL — ABNORMAL LOW (ref 45–182)
Saturation Ratios: 4 % — ABNORMAL LOW (ref 17.9–39.5)
TIBC: 448 ug/dL (ref 250–450)
UIBC: 432 ug/dL

## 2022-05-05 LAB — CBC WITH DIFFERENTIAL (CANCER CENTER ONLY)
Abs Immature Granulocytes: 0.01 10*3/uL (ref 0.00–0.07)
Basophils Absolute: 0 10*3/uL (ref 0.0–0.1)
Basophils Relative: 1 %
Eosinophils Absolute: 0 10*3/uL (ref 0.0–0.5)
Eosinophils Relative: 1 %
HCT: 33.2 % — ABNORMAL LOW (ref 39.0–52.0)
Hemoglobin: 10.5 g/dL — ABNORMAL LOW (ref 13.0–17.0)
Immature Granulocytes: 0 %
Lymphocytes Relative: 23 %
Lymphs Abs: 1.1 10*3/uL (ref 0.7–4.0)
MCH: 24.9 pg — ABNORMAL LOW (ref 26.0–34.0)
MCHC: 31.6 g/dL (ref 30.0–36.0)
MCV: 78.9 fL — ABNORMAL LOW (ref 80.0–100.0)
Monocytes Absolute: 0.6 10*3/uL (ref 0.1–1.0)
Monocytes Relative: 13 %
Neutro Abs: 2.9 10*3/uL (ref 1.7–7.7)
Neutrophils Relative %: 62 %
Platelet Count: 347 10*3/uL (ref 150–400)
RBC: 4.21 MIL/uL — ABNORMAL LOW (ref 4.22–5.81)
RDW: 14.8 % (ref 11.5–15.5)
WBC Count: 4.7 10*3/uL (ref 4.0–10.5)
nRBC: 0 % (ref 0.0–0.2)

## 2022-05-05 MED ORDER — SODIUM CHLORIDE 0.9% FLUSH
10.0000 mL | INTRAVENOUS | Status: DC | PRN
  Administered 2022-05-05: 10 mL

## 2022-05-05 MED ORDER — SODIUM CHLORIDE 0.9 % IV SOLN: Freq: Once | INTRAVENOUS | Status: AC

## 2022-05-05 MED ORDER — ACETAMINOPHEN 325 MG PO TABS
650.0000 mg | ORAL_TABLET | Freq: Once | ORAL | Status: AC
  Administered 2022-05-05: 650 mg via ORAL
  Filled 2022-05-05: qty 2

## 2022-05-05 MED ORDER — DIPHENHYDRAMINE HCL 25 MG PO CAPS
25.0000 mg | ORAL_CAPSULE | Freq: Once | ORAL | Status: AC
  Administered 2022-05-05: 25 mg via ORAL
  Filled 2022-05-05: qty 1

## 2022-05-05 MED ORDER — SODIUM CHLORIDE 0.9 % IV SOLN
1200.0000 mg | Freq: Once | INTRAVENOUS | Status: AC
  Administered 2022-05-05: 1200 mg via INTRAVENOUS
  Filled 2022-05-05: qty 20

## 2022-05-05 MED ORDER — FAMOTIDINE 20 MG PO TABS
20.0000 mg | ORAL_TABLET | Freq: Once | ORAL | Status: AC
  Administered 2022-05-05: 20 mg via ORAL
  Filled 2022-05-05: qty 1

## 2022-05-05 MED ORDER — HEPARIN SOD (PORK) LOCK FLUSH 100 UNIT/ML IV SOLN
500.0000 [IU] | Freq: Once | INTRAVENOUS | Status: AC | PRN
  Administered 2022-05-05: 500 [IU]

## 2022-05-05 NOTE — Patient Instructions (Signed)
[facility]  Discharge Instructions: Thank you for choosing [facility] to provide your oncology and hematology care.   If you have a lab appointment with the [facility], please go directly to the [facility] and check in at the registration area.   Wear comfortable clothing and clothing appropriate for easy access to any Portacath or PICC line.   We strive to give you quality time with your provider. You may need to reschedule your appointment if you arrive late (15 or more minutes).  Arriving late affects you and other patients whose appointments are after yours.  Also, if you miss three or more appointments without notifying the office, you may be dismissed from the clinic at the provider's discretion.      For prescription refill requests, have your pharmacy contact our office and allow 72 hours for refills to be completed.    Today you received the following chemotherapy and/or immunotherapy agents: atezolizumab      To help prevent nausea and vomiting after your treatment, we encourage you to take your nausea medication as directed.  BELOW ARE SYMPTOMS THAT SHOULD BE REPORTED IMMEDIATELY: *FEVER GREATER THAN 100.4 F (38 C) OR HIGHER *CHILLS OR SWEATING *NAUSEA AND VOMITING THAT IS NOT CONTROLLED WITH YOUR NAUSEA MEDICATION *UNUSUAL SHORTNESS OF BREATH *UNUSUAL BRUISING OR BLEEDING *URINARY PROBLEMS (pain or burning when urinating, or frequent urination) *BOWEL PROBLEMS (unusual diarrhea, constipation, pain near the anus) TENDERNESS IN MOUTH AND THROAT WITH OR WITHOUT PRESENCE OF ULCERS (sore throat, sores in mouth, or a toothache) UNUSUAL RASH, SWELLING OR PAIN  UNUSUAL VAGINAL DISCHARGE OR ITCHING   Items with * indicate a potential emergency and should be followed up as soon as possible or go to the Emergency Department if any problems should occur.  Please show the CHEMOTHERAPY ALERT CARD or IMMUNOTHERAPY ALERT CARD at check-in  to the Emergency Department and triage nurse.  Should you have questions after your visit or need to cancel or reschedule your appointment, please contact [facility]  Dept: [phone]  and follow the prompts.  Office hours are 8:00 a.m. to 4:30 p.m. Monday - Friday. Please note that voicemails left after 4:00 p.m. may not be returned until the following business day.  We are closed weekends and major holidays. You have access to a nurse at all times for urgent questions. Please call the main number to the clinic Dept: [phone] and follow the prompts.   For any non-urgent questions, you may also contact your provider using MyChart. We now offer e-Visits for anyone 71 and older to request care online for non-urgent symptoms. For details visit [email or web address].   Also download the MyChart app! Go to the app store, search "MyChart", open the app, select [facility], and log in with your MyChart username and password.

## 2022-05-06 LAB — FERRITIN: Ferritin: 7 ng/mL — ABNORMAL LOW (ref 24–336)

## 2022-05-19 ENCOUNTER — Encounter: Payer: Self-pay | Admitting: Hematology

## 2022-05-26 ENCOUNTER — Inpatient Hospital Stay: Payer: Medicare HMO

## 2022-05-26 ENCOUNTER — Inpatient Hospital Stay: Payer: Medicare HMO | Attending: Physician Assistant | Admitting: Hematology

## 2022-05-26 VITALS — BP 95/66 | HR 65 | Resp 18

## 2022-05-26 DIAGNOSIS — Z7189 Other specified counseling: Secondary | ICD-10-CM | POA: Diagnosis not present

## 2022-05-26 DIAGNOSIS — Z79899 Other long term (current) drug therapy: Secondary | ICD-10-CM | POA: Diagnosis not present

## 2022-05-26 DIAGNOSIS — C3491 Malignant neoplasm of unspecified part of right bronchus or lung: Secondary | ICD-10-CM | POA: Insufficient documentation

## 2022-05-26 DIAGNOSIS — Z87891 Personal history of nicotine dependence: Secondary | ICD-10-CM | POA: Insufficient documentation

## 2022-05-26 DIAGNOSIS — Z86718 Personal history of other venous thrombosis and embolism: Secondary | ICD-10-CM | POA: Insufficient documentation

## 2022-05-26 DIAGNOSIS — C771 Secondary and unspecified malignant neoplasm of intrathoracic lymph nodes: Secondary | ICD-10-CM | POA: Insufficient documentation

## 2022-05-26 DIAGNOSIS — Z5112 Encounter for antineoplastic immunotherapy: Secondary | ICD-10-CM | POA: Diagnosis present

## 2022-05-26 DIAGNOSIS — Z7901 Long term (current) use of anticoagulants: Secondary | ICD-10-CM | POA: Insufficient documentation

## 2022-05-26 DIAGNOSIS — D649 Anemia, unspecified: Secondary | ICD-10-CM | POA: Insufficient documentation

## 2022-05-26 LAB — CBC WITH DIFFERENTIAL (CANCER CENTER ONLY)
Abs Immature Granulocytes: 0.02 10*3/uL (ref 0.00–0.07)
Basophils Absolute: 0 10*3/uL (ref 0.0–0.1)
Basophils Relative: 0 %
Eosinophils Absolute: 0 10*3/uL (ref 0.0–0.5)
Eosinophils Relative: 1 %
HCT: 31.2 % — ABNORMAL LOW (ref 39.0–52.0)
Hemoglobin: 9.6 g/dL — ABNORMAL LOW (ref 13.0–17.0)
Immature Granulocytes: 0 %
Lymphocytes Relative: 22 %
Lymphs Abs: 1.2 10*3/uL (ref 0.7–4.0)
MCH: 24.1 pg — ABNORMAL LOW (ref 26.0–34.0)
MCHC: 30.8 g/dL (ref 30.0–36.0)
MCV: 78.2 fL — ABNORMAL LOW (ref 80.0–100.0)
Monocytes Absolute: 0.8 10*3/uL (ref 0.1–1.0)
Monocytes Relative: 14 %
Neutro Abs: 3.6 10*3/uL (ref 1.7–7.7)
Neutrophils Relative %: 63 %
Platelet Count: 397 10*3/uL (ref 150–400)
RBC: 3.99 MIL/uL — ABNORMAL LOW (ref 4.22–5.81)
RDW: 14.3 % (ref 11.5–15.5)
WBC Count: 5.7 10*3/uL (ref 4.0–10.5)
nRBC: 0 % (ref 0.0–0.2)

## 2022-05-26 LAB — CMP (CANCER CENTER ONLY)
ALT: 11 U/L (ref 0–44)
AST: 17 U/L (ref 15–41)
Albumin: 3.8 g/dL (ref 3.5–5.0)
Alkaline Phosphatase: 88 U/L (ref 38–126)
Anion gap: 4 — ABNORMAL LOW (ref 5–15)
BUN: 9 mg/dL (ref 6–20)
CO2: 28 mmol/L (ref 22–32)
Calcium: 9.1 mg/dL (ref 8.9–10.3)
Chloride: 105 mmol/L (ref 98–111)
Creatinine: 0.79 mg/dL (ref 0.61–1.24)
GFR, Estimated: >60 mL/min (ref >60)
Glucose, Bld: 91 mg/dL (ref 70–99)
Potassium: 4.2 mmol/L (ref 3.5–5.1)
Sodium: 137 mmol/L (ref 135–145)
Total Bilirubin: 0.2 mg/dL — ABNORMAL LOW (ref 0.3–1.2)
Total Protein: 6.4 g/dL — ABNORMAL LOW (ref 6.5–8.1)

## 2022-05-26 LAB — TSH: TSH: 1.032 u[IU]/mL (ref 0.350–4.500)

## 2022-05-26 MED ORDER — SODIUM CHLORIDE 0.9 % IV SOLN
1200.0000 mg | Freq: Once | INTRAVENOUS | Status: AC
  Administered 2022-05-26: 1200 mg via INTRAVENOUS
  Filled 2022-05-26: qty 20

## 2022-05-26 MED ORDER — FAMOTIDINE 20 MG PO TABS
20.0000 mg | ORAL_TABLET | Freq: Once | ORAL | Status: AC
  Administered 2022-05-26: 20 mg via ORAL
  Filled 2022-05-26: qty 1

## 2022-05-26 MED ORDER — SODIUM CHLORIDE 0.9% FLUSH
10.0000 mL | INTRAVENOUS | Status: DC | PRN
  Administered 2022-05-26: 10 mL

## 2022-05-26 MED ORDER — SODIUM CHLORIDE 0.9 % IV SOLN: Freq: Once | INTRAVENOUS | Status: AC

## 2022-05-26 MED ORDER — ACETAMINOPHEN 325 MG PO TABS
650.0000 mg | ORAL_TABLET | Freq: Once | ORAL | Status: AC
  Administered 2022-05-26: 650 mg via ORAL
  Filled 2022-05-26: qty 2

## 2022-05-26 MED ORDER — HEPARIN SOD (PORK) LOCK FLUSH 100 UNIT/ML IV SOLN
500.0000 [IU] | Freq: Once | INTRAVENOUS | Status: AC | PRN
  Administered 2022-05-26: 500 [IU]

## 2022-05-26 MED ORDER — DIPHENHYDRAMINE HCL 25 MG PO CAPS
25.0000 mg | ORAL_CAPSULE | Freq: Once | ORAL | Status: AC
  Administered 2022-05-26: 25 mg via ORAL
  Filled 2022-05-26: qty 1

## 2022-05-26 NOTE — Progress Notes (Signed)
Patient seen by MD today  Vitals are within treatment parameters.  Labs reviewed: and are within treatment parameters."  Per physician team, patient is ready for treatment and there are NO modifications to the treatment plan.

## 2022-05-26 NOTE — Progress Notes (Signed)
HEMATOLOGY/ONCOLOGY CLINIC NOTE  Date of Service: 05/26/22   Patient Care Team: Ellyn Hack, MD as PCP - General (Family Medicine)   CHIEF COMPLAINTS/PURPOSE OF CONSULTATION:  Follow-up for continued evaluation and management of metastatic lung adenocarcinoma  HISTORY OF PRESENTING ILLNESS:  Please see previous notes for details on initial presentation.  Current Treatment:  Atezolizumab maintenance   INTERVAL HISTORY:  David Berry. Is a 57 y.o. male here for continued evaluation and management of his metastatic lung adenocarcinoma. He is here today to start cycle 6 of maintenance f Atezolizumab.  Patient was last seen by me on 04/14/2022 and complained of occasional skin rashes in his bilateral upper UE, lower back, and bilateral LE, which he contributed to orange juice consumption. The rashes did resolve. He also complained of mild hemorrhoidal bleeding during every bowel movement and mild nausea which causes him to wake up in middle of the night.   Patient notes that he is doing well since our last visit. He notes that he still has hemorrhoidal bleeding, but it is slowly improving. He notes that he had cold last Sunday, 05/16/2022, which caused him to los about 15 lbs. Patient also reports of nausea which caused him to throw up which included blood. He reports he is doing better and his cold symptoms have improved.   Patient reports that he still has hemorrhoidal bleeding around 1-2 times a day, but it has improved since our last visit.   He complains of tremors on bilateral hands and has seen his Neurologist last year.   He denies fever, chills, night sweats, skin rashes, abdominal pain, back pain, chest pain, abnormal bowel movement, or leg swelling.    MEDICAL HISTORY:  Past Medical History:  Diagnosis Date   Anxiety    Bipolar disorder (HCC)    Blood in stool    Cancer (HCC)    Chronic bronchitis with emphysema (HCC)    pt denies this   GERD  (gastroesophageal reflux disease)    Lung cancer (HCC) dx'd 01/2019   Peripheral vascular disease (HCC)    blood clot in neck Sept 12, 2020    SURGICAL HISTORY: Past Surgical History:  Procedure Laterality Date   APPENDECTOMY     teenager   INGUINAL HERNIA REPAIR Right 2016, 2017   [facility], 2018 [facility]-removed mesh   IR IMAGING GUIDED PORT INSERTION  04/26/2019   TOOTH EXTRACTION N/A 03/14/2020   Procedure: DENTAL RESTORATION/EXTRACTIONS;  Surgeon: Ocie Doyne, DDS;  Location: [facility] OR;  Service: Oral Surgery;  Laterality: N/A;    SOCIAL HISTORY: Social History   Socioeconomic History   Marital status: Single    Spouse name: Not on file   Number of children: 1   Years of education: GED   Highest education level: Some college, no degree  Occupational History    Comment: fork lift driver  Tobacco Use   Smoking status: Former    Types: Cigars    Quit date: 01/20/2019    Years since quitting: 3.3   Smokeless tobacco: Never   Tobacco comments:    smokes 3 black and mild cigars per day  Vaping Use   Vaping Use: Never used  Substance and Sexual Activity   Alcohol use: Not Currently   Drug use: No   Sexual activity: Yes  Other Topics Concern   Not on file  Social History Narrative   Lives alone   Caffeine- coffee, 20 oz daily, tea occas  Social Determinants of Health   Financial Resource Strain: Low Risk  (09/29/2020)   Overall Financial Resource Strain (CARDIA)    Difficulty of Paying Living Expenses: Not very hard  Food Insecurity: No Food Insecurity (09/29/2020)   Hunger Vital Sign    Worried About Running Out of Food in the Last Year: Never true    Ran Out of Food in the Last Year: Never true  Transportation Needs: No Transportation Needs (09/29/2020)   PRAPARE - Administrator, Civil Service (Medical): No    Lack of Transportation (Non-Medical): No  Physical Activity: Sufficiently Active (09/29/2020)   Exercise Vital Sign    Days of  Exercise per Week: 3 days    Minutes of Exercise per Session: 150+ min  Stress: No Stress Concern Present (09/29/2020)   [employer] of Occupational Health - Occupational Stress Questionnaire    Feeling of Stress : Not at all  Social Connections: Socially Isolated (09/29/2020)   Social Connection and Isolation Panel [NHANES]    Frequency of Communication with Friends and Family: More than three times a week    Frequency of Social Gatherings with Friends and Family: Once a week    Attends Religious Services: Never    Database administrator or Organizations: No    Attends Engineer, structural: Not on file    Marital Status: Never married  Catering manager Violence: Not on file    FAMILY HISTORY: Family History  Problem Relation Age of Onset   Prostate cancer Father    Colon cancer Neg Hx    Stomach cancer Neg Hx    Esophageal cancer Neg Hx     ALLERGIES:  is allergic to atorvastatin calcium.  MEDICATIONS:  Current Outpatient Medications  Medication Sig Dispense Refill   Cyanocobalamin (VITAMIN B 12 PO) Take 2,500 mcg by mouth daily.     ELIQUIS 5 MG TABS tablet TAKE 1 TABLET(5 MG) BY MOUTH TWICE DAILY 60 tablet 11   esomeprazole (NEXIUM) 40 MG capsule Take 1 capsule (40 mg total) by mouth daily. 90 capsule 2   hydrocortisone (ANUSOL-HC) 2.5 % rectal cream Use twice a day for 7 days 30 g 1   lactose free nutrition (BOOST PLUS) LIQD Drink one carton (237 ml) Boost Plus/equivalent three times daily in between meals  0   lidocaine-prilocaine (EMLA) cream Apply 1 application. topically as needed. 30 g 0   LIVALO 1 MG TABS Take 1 tablet by mouth at bedtime.     LORazepam (ATIVAN) 0.5 MG tablet Take 1 tablet (0.5 mg total) by mouth every 8 (eight) hours. 30 tablet 0   propranolol (INDERAL) 20 MG tablet TAKE 1 TABLET(20 MG) BY MOUTH TWICE DAILY 60 tablet 5   rosuvastatin (CRESTOR) 10 MG tablet Take 10 mg by mouth at bedtime.     sildenafil (VIAGRA) 50 MG tablet Take 1  tablet (50 mg total) by mouth daily as needed for erectile dysfunction. Take 30 mins to 4 hours prior to sexual activity 30 tablet 1   No current facility-administered medications for this visit.   REVIEW OF SYSTEMS:   .10 Point review of Systems was done is negative except as noted above.   PHYSICAL EXAMINATION: ECOG FS:1 - Symptomatic but completely ambulatory .BP 93/73   Pulse 76   Temp 97.7 F (36.5 C)   Resp 20   Wt 130 lb 11.2 oz (59.3 kg)   SpO2 100%   BMI 18.23 kg/m    Wt Readings from  Last 3 Encounters:  05/05/22 130 lb 8 oz (59.2 kg)  04/14/22 132 lb 11.2 oz (60.2 kg)  03/24/22 131 lb 8 oz (59.6 kg)   Body mass index is 18.23 kg/m.   NAD GENERAL:alert, in no acute distress and comfortable SKIN: no acute rashes, no significant lesions EYES: conjunctiva are pink and non-injected, sclera anicteric NECK: supple, no JVD LYMPH:  no palpable lymphadenopathy in the cervical, axillary or inguinal regions LUNGS: clear to auscultation b/l with normal respiratory effort HEART: regular rate & rhythm ABDOMEN:  normoactive bowel sounds , non tender, not distended. Extremity: no pedal edema PSYCH: alert & oriented x 3 with fluent speech NEURO: no focal motor/sensory deficits  LABORATORY DATA:  I have reviewed the data as listed  .    Latest Ref Rng & Units 05/26/2022    1:08 PM 05/05/2022    2:32 PM 04/14/2022   12:14 PM  CBC  WBC 4.0 - 10.5 K/uL 5.7  4.7  5.3   Hemoglobin 13.0 - 17.0 g/dL 9.6  10.5  10.5   Hematocrit 39.0 - 52.0 % 31.2  33.2  34.2   Platelets 150 - 400 K/uL 397  347  329     .    Latest Ref Rng & Units 05/26/2022    1:08 PM 05/05/2022    2:32 PM 04/14/2022   12:14 PM  CMP  Glucose 70 - 99 mg/dL 91  107  104   BUN 6 - 20 mg/dL 9  12  11    Creatinine 0.61 - 1.24 mg/dL 0.79  0.91  0.85   Sodium 135 - 145 mmol/L 137  138  137   Potassium 3.5 - 5.1 mmol/L 4.2  4.0  3.9   Chloride 98 - 111 mmol/L 105  106  105   CO2 22 - 32 mmol/L 28  27  27     Calcium 8.9 - 10.3 mg/dL 9.1  9.4  9.4   Total Protein 6.5 - 8.1 g/dL 6.4  7.4  6.8   Total Bilirubin 0.3 - 1.2 mg/dL 0.2  0.5  0.4   Alkaline Phos 38 - 126 U/L 88  104  99   AST 15 - 41 U/L 17  18  16    ALT 0 - 44 U/L 11  13  8     . Lab Results  Component Value Date   IRON 16 (L) 05/05/2022   TIBC 448 05/05/2022   IRONPCTSAT 4 (L) 05/05/2022   (Iron and TIBC)  Lab Results  Component Value Date   FERRITIN 7 (L) 05/05/2022     01/22/2019 Foundation One: Tumor Mutational Burden     01/22/2019 PD-L1 Immunohistochemistry Analysis    01/22/2019 Soft Tissue Needle Core Biopsy Surgical Pathology     RADIOGRAPHIC STUDIES: I have personally reviewed the radiological images as listed and agreed with the findings in the report.   .No results found.   ASSESSMENT & PLAN:   This is a 57 year old male with   1. Metastatic Poorly differentiated lung adenocarcinoma Presented with Large neck mass, mediastinal mass, renal mass, and questionable bone lesions  no brain mets on MRI brain 01/22/2019 PD-L1 Immunohistochemistry Analysis which revealed "Tumor Proportion Score (TPS) 50%" 05/16/2019 PET/CT ([id number]) revealed "Radiation changes in the right hemithorax, as above. Improving mediastinal nodal metastases. Prior bulky right cervical metastases have resolved. No evidence of metastatic disease in the abdomen/pelvis." 07/19/2019 Esophagus Scan ([id number]) revealed "Mild stricture at the C6-7 level likely related to prior esophageal surgery. Barium tablet  was slow to pass through this area but did pass through after 1 minutes. Hiatal hernia with moderate gastroesophageal reflux. Moderate stricture above the hiatal hernia likely due to reflux. Barium tablet did not pass this area. There are changes of esophagitis which are likely due to reflux and possibly radiation as well."  10/10/19 of  CT Chest W Contrast ([id number])- no evidence of lung cancer progression at this time. He did  have a PET CT scan on 04/13/2021 which was reviewed on the previous visit by Murray Hodgkins.  This showed some borderline FDG avid right paratracheal right hilar and azygoesophageal lymph nodes that were suspicious for possible nodal recurrence however the patient notes that he did have a bad upper respiratory tract infection around the time that he had his PET CT scan and therefore reactive adenopathy is a possibility as well.    2. 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 Symptomatic hemorrhoids-currently no significant bleeding but have caused some mild iron deficiency anemia.  5. Normocytic anemia -Likely due to underlying malignancy   6. S/pThrombocytosis -- likely due to paraneoplastic effect of tumor and reactive due to inflammation and tissue inflammation from RT.-- now resolved.   7. Nicotine dependence -has quit since cancer diagnosis  8. Iron deficiency - likely due to blood loss from hemorrhoids  9.  Erectile dysfunction-having partial benefits with using Viagra.  PLAN:  -Discussed lab results from today, 05/26/2022, with the patient. CBC shows decreased hemoglobin of 9.6 K and hematocrit of 31.2. CMP is stable.  Patient is iron deficient. . Lab Results  Component Value Date   IRON 16 (L) 05/05/2022   TIBC 448 05/05/2022   IRONPCTSAT 4 (L) 05/05/2022   (Iron and TIBC)  Lab Results  Component Value Date   FERRITIN 7 (L) 05/05/2022    -Plan on IV venofer 300mg  weekly x 3 doses -Patient has no notable toxicities from his atezolizumab immunotherapy. -We shall continue his maintenance atezolizumab every 3 weeks until disease progression or significant toxicities. Patient has no clinical symptoms suggestive of progression of his lung cancer at this time. -advised patient to stay hydrated and consume a healthy diet, including greens and fruit  consumption.   -recommended to follow-up with Neurologist regarding his bilateral hand tremors.    FOLLOW UP: IV Venofer 300mg  weekly x 3 doses Continue Atezolizumab per integrated scheduling  The total time spent in the appointment was 25 minutes* .  All of the patient's questions were answered with apparent satisfaction. The patient knows to call the clinic with any problems, questions or concerns.   Sullivan Lone MD MS AAHIVMS [facility] Hematology/Oncology Physician [facility]  .*Total Encounter Time as defined by the Centers for Medicare and Medicaid Services includes, in addition to the face-to-face time of a patient visit (documented in the note above) non-face-to-face time: obtaining and reviewing outside history, ordering and reviewing medications, tests or procedures, care coordination (communications with other health care professionals or caregivers) and documentation in the medical record.   I, Cleda Mccreedy, am acting as a Education administrator for Sullivan Lone, MD.  .I have reviewed the above documentation for accuracy and completeness, and I agree with the above. Brunetta Genera MD

## 2022-05-26 NOTE — Patient Instructions (Signed)
[facility] MEDICAL ONCOLOGY  Discharge Instructions: Thank you for choosing [facility] to provide your oncology and hematology care.   If you have a lab appointment with the Cancer Center, please go directly to the Cancer Center and check in at the registration area.   Wear comfortable clothing and clothing appropriate for easy access to any Portacath or PICC line.   We strive to give you quality time with your provider. You may need to reschedule your appointment if you arrive late (15 or more minutes).  Arriving late affects you and other patients whose appointments are after yours.  Also, if you miss three or more appointments without notifying the office, you may be dismissed from the clinic at the provider's discretion.      For prescription refill requests, have your pharmacy contact our office and allow 72 hours for refills to be completed.    Today you received the following chemotherapy and/or immunotherapy agents tecentriq      To help prevent nausea and vomiting after your treatment, we encourage you to take your nausea medication as directed.  BELOW ARE SYMPTOMS THAT SHOULD BE REPORTED IMMEDIATELY: *FEVER GREATER THAN 100.4 F (38 C) OR HIGHER *CHILLS OR SWEATING *NAUSEA AND VOMITING THAT IS NOT CONTROLLED WITH YOUR NAUSEA MEDICATION *UNUSUAL SHORTNESS OF BREATH *UNUSUAL BRUISING OR BLEEDING *URINARY PROBLEMS (pain or burning when urinating, or frequent urination) *BOWEL PROBLEMS (unusual diarrhea, constipation, pain near the anus) TENDERNESS IN MOUTH AND THROAT WITH OR WITHOUT PRESENCE OF ULCERS (sore throat, sores in mouth, or a toothache) UNUSUAL RASH, SWELLING OR PAIN  UNUSUAL VAGINAL DISCHARGE OR ITCHING   Items with * indicate a potential emergency and should be followed up as soon as possible or go to the Emergency Department if any problems should occur.  Please show the CHEMOTHERAPY ALERT CARD or IMMUNOTHERAPY ALERT CARD at check-in to  the Emergency Department and triage nurse.  Should you have questions after your visit or need to cancel or reschedule your appointment, please contact [facility] MEDICAL ONCOLOGY  Dept: [phone]  and follow the prompts.  Office hours are 8:00 a.m. to 4:30 p.m. Monday - Friday. Please note that voicemails left after 4:00 p.m. may not be returned until the following business day.  We are closed weekends and major holidays. You have access to a nurse at all times for urgent questions. Please call the main number to the clinic Dept: [phone] and follow the prompts.   For any non-urgent questions, you may also contact your provider using MyChart. We now offer e-Visits for anyone 30 and older to request care online for non-urgent symptoms. For details visit [email or web address].   Also download the MyChart app! Go to the app store, search "MyChart", open the app, select [place], and log in with your MyChart username and password.

## 2022-06-01 ENCOUNTER — Encounter: Payer: Self-pay | Admitting: Hematology

## 2022-06-05 ENCOUNTER — Other Ambulatory Visit: Payer: Self-pay | Admitting: Hematology

## 2022-06-05 DIAGNOSIS — C3491 Malignant neoplasm of unspecified part of right bronchus or lung: Secondary | ICD-10-CM

## 2022-06-09 ENCOUNTER — Inpatient Hospital Stay: Payer: Medicare HMO

## 2022-06-09 VITALS — BP 97/68 | HR 66 | Temp 98.4°F | Resp 16 | Ht 71.0 in | Wt 133.1 lb

## 2022-06-09 DIAGNOSIS — Z5112 Encounter for antineoplastic immunotherapy: Secondary | ICD-10-CM | POA: Diagnosis not present

## 2022-06-09 DIAGNOSIS — Z95828 Presence of other vascular implants and grafts: Secondary | ICD-10-CM

## 2022-06-09 MED ORDER — SODIUM CHLORIDE 0.9 % IV SOLN: Freq: Once | INTRAVENOUS | Status: AC

## 2022-06-09 MED ORDER — SODIUM CHLORIDE 0.9 % IV SOLN
300.0000 mg | Freq: Once | INTRAVENOUS | Status: AC
  Administered 2022-06-09: 300 mg via INTRAVENOUS
  Filled 2022-06-09: qty 300

## 2022-06-09 MED ORDER — LORATADINE 10 MG PO TABS
10.0000 mg | ORAL_TABLET | Freq: Once | ORAL | Status: AC
  Administered 2022-06-09: 10 mg via ORAL
  Filled 2022-06-09: qty 1

## 2022-06-09 MED ORDER — ACETAMINOPHEN 325 MG PO TABS
650.0000 mg | ORAL_TABLET | Freq: Once | ORAL | Status: AC
  Administered 2022-06-09: 650 mg via ORAL
  Filled 2022-06-09: qty 2

## 2022-06-09 NOTE — Patient Instructions (Signed)
Iron Sucrose Injection What is this medication? IRON SUCROSE (EYE ern SOO krose) treats low levels of iron (iron deficiency anemia) in people with kidney disease. Iron is a mineral that plays an important role in making red blood cells, which carry oxygen from your lungs to the rest of your body. This medicine may be used for other purposes; ask your health care provider or pharmacist if you have questions. COMMON BRAND NAME(S): Venofer What should I tell my care team before I take this medication? They need to know if you have any of these conditions: Anemia not caused by low iron levels Heart disease High levels of iron in the blood Kidney disease Liver disease An unusual or allergic reaction to iron, other medications, foods, dyes, or preservatives Pregnant or trying to get pregnant Breastfeeding How should I use this medication? This medication is for infusion into a vein. It is given in a hospital or clinic setting. Talk to your care team about the use of this medication in children. While this medication may be prescribed for children as young as 2 years for selected conditions, precautions do apply. Overdosage: If you think you have taken too much of this medicine contact a poison control center or emergency room at once. NOTE: This medicine is only for you. Do not share this medicine with others. What if I miss a dose? Keep appointments for follow-up doses. It is important not to miss your dose. Call your care team if you are unable to keep an appointment. What may interact with this medication? Do not take this medication with any of the following: Deferoxamine Dimercaprol Other iron products This medication may also interact with the following: Chloramphenicol Deferasirox This list may not describe all possible interactions. Give your health care provider a list of all the medicines, herbs, non-prescription drugs, or dietary supplements you use. Also tell them if you smoke,  drink alcohol, or use illegal drugs. Some items may interact with your medicine. What should I watch for while using this medication? Visit your care team regularly. Tell your care team if your symptoms do not start to get better or if they get worse. You may need blood work done while you are taking this medication. You may need to follow a special diet. Talk to your care team. Foods that contain iron include: whole grains/cereals, dried fruits, beans, or peas, leafy green vegetables, and organ meats (liver, kidney). What side effects may I notice from receiving this medication? Side effects that you should report to your care team as soon as possible: Allergic reactions--skin rash, itching, hives, swelling of the face, lips, tongue, or throat Low blood pressure--dizziness, feeling faint or lightheaded, blurry vision Shortness of breath Side effects that usually do not require medical attention (report to your care team if they continue or are bothersome): Flushing Headache Joint pain Muscle pain Nausea Pain, redness, or irritation at injection site This list may not describe all possible side effects. Call your doctor for medical advice about side effects. You may report side effects to FDA at 1-800-FDA-1088. Where should I keep my medication? This medication is given in a hospital or clinic and will not be stored at home. NOTE: This sheet is a summary. It may not cover all possible information. If you have questions about this medicine, talk to your doctor, pharmacist, or health care provider.  2023 Elsevier/Gold Standard (2020-08-07 00:00:00)

## 2022-06-09 NOTE — Progress Notes (Signed)
Pt tolerated IV iron infusion well. Monitored for 30 minute post observation period. VSS, ambulatory to lobby.

## 2022-06-14 ENCOUNTER — Other Ambulatory Visit: Payer: Self-pay

## 2022-06-14 DIAGNOSIS — C3491 Malignant neoplasm of unspecified part of right bronchus or lung: Secondary | ICD-10-CM

## 2022-06-16 ENCOUNTER — Other Ambulatory Visit: Payer: Medicare Other

## 2022-06-16 ENCOUNTER — Ambulatory Visit: Payer: Medicare Other

## 2022-06-16 ENCOUNTER — Other Ambulatory Visit: Payer: Self-pay | Admitting: Hematology

## 2022-06-16 ENCOUNTER — Inpatient Hospital Stay: Payer: Medicare HMO | Attending: Physician Assistant

## 2022-06-16 ENCOUNTER — Inpatient Hospital Stay: Payer: Medicare HMO

## 2022-06-16 VITALS — BP 109/67 | HR 79 | Temp 97.6°F | Resp 18 | Wt 131.8 lb

## 2022-06-16 DIAGNOSIS — Z7189 Other specified counseling: Secondary | ICD-10-CM

## 2022-06-16 DIAGNOSIS — Z5112 Encounter for antineoplastic immunotherapy: Secondary | ICD-10-CM | POA: Diagnosis present

## 2022-06-16 DIAGNOSIS — Z95828 Presence of other vascular implants and grafts: Secondary | ICD-10-CM

## 2022-06-16 DIAGNOSIS — C771 Secondary and unspecified malignant neoplasm of intrathoracic lymph nodes: Secondary | ICD-10-CM | POA: Diagnosis present

## 2022-06-16 DIAGNOSIS — C3491 Malignant neoplasm of unspecified part of right bronchus or lung: Secondary | ICD-10-CM

## 2022-06-16 DIAGNOSIS — Z86718 Personal history of other venous thrombosis and embolism: Secondary | ICD-10-CM | POA: Insufficient documentation

## 2022-06-16 DIAGNOSIS — Z7901 Long term (current) use of anticoagulants: Secondary | ICD-10-CM | POA: Diagnosis not present

## 2022-06-16 DIAGNOSIS — D500 Iron deficiency anemia secondary to blood loss (chronic): Secondary | ICD-10-CM | POA: Diagnosis not present

## 2022-06-16 DIAGNOSIS — Z79899 Other long term (current) drug therapy: Secondary | ICD-10-CM | POA: Insufficient documentation

## 2022-06-16 DIAGNOSIS — Z87891 Personal history of nicotine dependence: Secondary | ICD-10-CM | POA: Diagnosis not present

## 2022-06-16 LAB — CMP (CANCER CENTER ONLY)
ALT: 11 U/L (ref 0–44)
AST: 16 U/L (ref 15–41)
Albumin: 4.2 g/dL (ref 3.5–5.0)
Alkaline Phosphatase: 107 U/L (ref 38–126)
Anion gap: 5 (ref 5–15)
BUN: 11 mg/dL (ref 6–20)
CO2: 29 mmol/L (ref 22–32)
Calcium: 9.7 mg/dL (ref 8.9–10.3)
Chloride: 105 mmol/L (ref 98–111)
Creatinine: 0.74 mg/dL (ref 0.61–1.24)
GFR, Estimated: >60 mL/min (ref >60)
Glucose, Bld: 95 mg/dL (ref 70–99)
Potassium: 4.4 mmol/L (ref 3.5–5.1)
Sodium: 139 mmol/L (ref 135–145)
Total Bilirubin: 0.2 mg/dL — ABNORMAL LOW (ref 0.3–1.2)
Total Protein: 7 g/dL (ref 6.5–8.1)

## 2022-06-16 LAB — CBC WITH DIFFERENTIAL (CANCER CENTER ONLY)
Abs Immature Granulocytes: 0.02 10*3/uL (ref 0.00–0.07)
Basophils Absolute: 0 10*3/uL (ref 0.0–0.1)
Basophils Relative: 1 %
Eosinophils Absolute: 0.1 10*3/uL (ref 0.0–0.5)
Eosinophils Relative: 1 %
HCT: 33.9 % — ABNORMAL LOW (ref 39.0–52.0)
Hemoglobin: 10.4 g/dL — ABNORMAL LOW (ref 13.0–17.0)
Immature Granulocytes: 1 %
Lymphocytes Relative: 28 %
Lymphs Abs: 1.2 10*3/uL (ref 0.7–4.0)
MCH: 24.3 pg — ABNORMAL LOW (ref 26.0–34.0)
MCHC: 30.7 g/dL (ref 30.0–36.0)
MCV: 79.2 fL — ABNORMAL LOW (ref 80.0–100.0)
Monocytes Absolute: 0.7 10*3/uL (ref 0.1–1.0)
Monocytes Relative: 15 %
Neutro Abs: 2.4 10*3/uL (ref 1.7–7.7)
Neutrophils Relative %: 54 %
Platelet Count: 286 10*3/uL (ref 150–400)
RBC: 4.28 MIL/uL (ref 4.22–5.81)
RDW: 16.8 % — ABNORMAL HIGH (ref 11.5–15.5)
WBC Count: 4.4 10*3/uL (ref 4.0–10.5)
nRBC: 0 % (ref 0.0–0.2)

## 2022-06-16 LAB — TSH: TSH: 2.68 u[IU]/mL (ref 0.350–4.500)

## 2022-06-16 MED ORDER — ACETAMINOPHEN 325 MG PO TABS: 650.0000 mg | ORAL_TABLET | Freq: Once | ORAL | Status: AC

## 2022-06-16 MED ORDER — LORATADINE 10 MG PO TABS
10.0000 mg | ORAL_TABLET | Freq: Once | ORAL | Status: DC
  Filled 2022-06-16: qty 1

## 2022-06-16 MED ORDER — SODIUM CHLORIDE 0.9 % IV SOLN
300.0000 mg | Freq: Once | INTRAVENOUS | Status: AC
  Administered 2022-06-16: 300 mg via INTRAVENOUS
  Filled 2022-06-16: qty 300

## 2022-06-16 MED ORDER — SODIUM CHLORIDE 0.9% FLUSH
10.0000 mL | INTRAVENOUS | Status: DC | PRN
  Administered 2022-06-16: 10 mL

## 2022-06-16 MED ORDER — SODIUM CHLORIDE 0.9 % IV SOLN
1200.0000 mg | Freq: Once | INTRAVENOUS | Status: AC
  Administered 2022-06-16: 1200 mg via INTRAVENOUS
  Filled 2022-06-16: qty 20

## 2022-06-16 MED ORDER — DIPHENHYDRAMINE HCL 25 MG PO CAPS
25.0000 mg | ORAL_CAPSULE | Freq: Once | ORAL | Status: AC
  Administered 2022-06-16: 25 mg via ORAL
  Filled 2022-06-16: qty 1

## 2022-06-16 MED ORDER — HEPARIN SOD (PORK) LOCK FLUSH 100 UNIT/ML IV SOLN
500.0000 [IU] | Freq: Once | INTRAVENOUS | Status: AC | PRN
  Administered 2022-06-16: 500 [IU]

## 2022-06-16 MED ORDER — ACETAMINOPHEN 325 MG PO TABS
650.0000 mg | ORAL_TABLET | Freq: Once | ORAL | Status: AC
  Administered 2022-06-16: 650 mg via ORAL
  Filled 2022-06-16: qty 2

## 2022-06-16 MED ORDER — FAMOTIDINE 20 MG PO TABS
20.0000 mg | ORAL_TABLET | Freq: Once | ORAL | Status: AC
  Administered 2022-06-16: 20 mg via ORAL
  Filled 2022-06-16: qty 1

## 2022-06-16 MED ORDER — SODIUM CHLORIDE 0.9 % IV SOLN: Freq: Once | INTRAVENOUS | Status: AC

## 2022-06-16 NOTE — Patient Instructions (Signed)
[facility]  Discharge Instructions: Thank you for choosing [facility] to provide your oncology and hematology care.   If you have a lab appointment with the [facility], please go directly to the [facility] and check in at the registration area.   Wear comfortable clothing and clothing appropriate for easy access to any Portacath or PICC line.   We strive to give you quality time with your provider. You may need to reschedule your appointment if you arrive late (15 or more minutes).  Arriving late affects you and other patients whose appointments are after yours.  Also, if you miss three or more appointments without notifying the office, you may be dismissed from the clinic at the provider's discretion.      For prescription refill requests, have your pharmacy contact our office and allow 72 hours for refills to be completed.    Today you received the following chemotherapy and/or immunotherapy agents Tecentriq      To help prevent nausea and vomiting after your treatment, we encourage you to take your nausea medication as directed.  BELOW ARE SYMPTOMS THAT SHOULD BE REPORTED IMMEDIATELY: *FEVER GREATER THAN 100.4 F (38 C) OR HIGHER *CHILLS OR SWEATING *NAUSEA AND VOMITING THAT IS NOT CONTROLLED WITH YOUR NAUSEA MEDICATION *UNUSUAL SHORTNESS OF BREATH *UNUSUAL BRUISING OR BLEEDING *URINARY PROBLEMS (pain or burning when urinating, or frequent urination) *BOWEL PROBLEMS (unusual diarrhea, constipation, pain near the anus) TENDERNESS IN MOUTH AND THROAT WITH OR WITHOUT PRESENCE OF ULCERS (sore throat, sores in mouth, or a toothache) UNUSUAL RASH, SWELLING OR PAIN  UNUSUAL VAGINAL DISCHARGE OR ITCHING   Items with * indicate a potential emergency and should be followed up as soon as possible or go to the Emergency Department if any problems should occur.  Please show the CHEMOTHERAPY ALERT CARD or IMMUNOTHERAPY ALERT CARD at  check-in to the Emergency Department and triage nurse.  Should you have questions after your visit or need to cancel or reschedule your appointment, please contact [facility]  Dept: [phone]  and follow the prompts.  Office hours are 8:00 a.m. to 4:30 p.m. Monday - Friday. Please note that voicemails left after 4:00 p.m. may not be returned until the following business day.  We are closed weekends and major holidays. You have access to a nurse at all times for urgent questions. Please call the main number to the clinic Dept: [phone] and follow the prompts.   For any non-urgent questions, you may also contact your provider using MyChart. We now offer e-Visits for anyone 90 and older to request care online for non-urgent symptoms. For details visit [email or web address].   Also download the MyChart app! Go to the app store, search "MyChart", open the app, select [facility], and log in with your MyChart username and password.  Iron Sucrose Injection What is this medication? IRON SUCROSE (EYE ern SOO krose) treats low levels of iron (iron deficiency anemia) in people with kidney disease. Iron is a mineral that plays an important role in making red blood cells, which carry oxygen from your lungs to the rest of your body. This medicine may be used for other purposes; ask your health care provider or pharmacist if you have questions. COMMON BRAND NAME(S): Venofer What should I tell my care team before I take this medication? They need to know if you have any of these conditions: Anemia not caused by low iron levels Heart  disease High levels of iron in the blood Kidney disease Liver disease An unusual or allergic reaction to iron, other medications, foods, dyes, or preservatives Pregnant or trying to get pregnant Breastfeeding How should I use this medication? This medication is for infusion into a vein. It is given in a hospital or clinic  setting. Talk to your care team about the use of this medication in children. While this medication may be prescribed for children as young as 2 years for selected conditions, precautions do apply. Overdosage: If you think you have taken too much of this medicine contact a poison control center or emergency room at once. NOTE: This medicine is only for you. Do not share this medicine with others. What if I miss a dose? Keep appointments for follow-up doses. It is important not to miss your dose. Call your care team if you are unable to keep an appointment. What may interact with this medication? Do not take this medication with any of the following: Deferoxamine Dimercaprol Other iron products This medication may also interact with the following: Chloramphenicol Deferasirox This list may not describe all possible interactions. Give your health care provider a list of all the medicines, herbs, non-prescription drugs, or dietary supplements you use. Also tell them if you smoke, drink alcohol, or use illegal drugs. Some items may interact with your medicine. What should I watch for while using this medication? Visit your care team regularly. Tell your care team if your symptoms do not start to get better or if they get worse. You may need blood work done while you are taking this medication. You may need to follow a special diet. Talk to your care team. Foods that contain iron include: whole grains/cereals, dried fruits, beans, or peas, leafy green vegetables, and organ meats (liver, kidney). What side effects may I notice from receiving this medication? Side effects that you should report to your care team as soon as possible: Allergic reactions--skin rash, itching, hives, swelling of the face, lips, tongue, or throat Low blood pressure--dizziness, feeling faint or lightheaded, blurry vision Shortness of breath Side effects that usually do not require medical attention (report to your care team  if they continue or are bothersome): Flushing Headache Joint pain Muscle pain Nausea Pain, redness, or irritation at injection site This list may not describe all possible side effects. Call your doctor for medical advice about side effects. You may report side effects to FDA at 1-800-FDA-1088. Where should I keep my medication? This medication is given in a hospital or clinic and will not be stored at home. NOTE: This sheet is a summary. It may not cover all possible information. If you have questions about this medicine, talk to your doctor, pharmacist, or health care provider.  2023 Elsevier/Gold Standard (2020-08-07 00:00:00)

## 2022-06-16 NOTE — Progress Notes (Signed)
Pt observed for 15 minutes post Venofer infusion. Pt tolerated trtmt well w/out incident. VSS prior to Tecentriq infusion.

## 2022-06-16 NOTE — Progress Notes (Signed)
Per Dr. Irene Limbo OK to proceed with Tecentriq today without the CMP result.

## 2022-06-17 ENCOUNTER — Other Ambulatory Visit: Payer: Self-pay

## 2022-06-22 ENCOUNTER — Other Ambulatory Visit: Payer: Self-pay

## 2022-06-23 ENCOUNTER — Inpatient Hospital Stay: Payer: Medicare HMO

## 2022-06-23 VITALS — BP 118/78 | HR 70 | Temp 98.2°F | Resp 18

## 2022-06-23 DIAGNOSIS — Z5112 Encounter for antineoplastic immunotherapy: Secondary | ICD-10-CM | POA: Diagnosis not present

## 2022-06-23 DIAGNOSIS — Z95828 Presence of other vascular implants and grafts: Secondary | ICD-10-CM

## 2022-06-23 MED ORDER — LORATADINE 10 MG PO TABS
10.0000 mg | ORAL_TABLET | Freq: Once | ORAL | Status: AC
  Administered 2022-06-23: 10 mg via ORAL
  Filled 2022-06-23: qty 1

## 2022-06-23 MED ORDER — SODIUM CHLORIDE 0.9 % IV SOLN
300.0000 mg | Freq: Once | INTRAVENOUS | Status: AC
  Administered 2022-06-23: 300 mg via INTRAVENOUS
  Filled 2022-06-23: qty 300

## 2022-06-23 MED ORDER — SODIUM CHLORIDE 0.9% FLUSH: 10.0000 mL | Freq: Once | INTRAVENOUS | Status: DC | PRN

## 2022-06-23 MED ORDER — ACETAMINOPHEN 325 MG PO TABS
650.0000 mg | ORAL_TABLET | Freq: Once | ORAL | Status: AC
  Administered 2022-06-23: 650 mg via ORAL
  Filled 2022-06-23: qty 2

## 2022-06-23 MED ORDER — SODIUM CHLORIDE 0.9 % IV SOLN: Freq: Once | INTRAVENOUS | Status: AC

## 2022-06-23 MED ORDER — HEPARIN SOD (PORK) LOCK FLUSH 100 UNIT/ML IV SOLN: 500.0000 [IU] | Freq: Once | INTRAVENOUS | Status: DC | PRN

## 2022-06-23 NOTE — Progress Notes (Signed)
Patient declined to wait 30 minutes for iron infusion. Vital signs stable at discharge.

## 2022-07-04 ENCOUNTER — Other Ambulatory Visit: Payer: Self-pay | Admitting: Gastroenterology

## 2022-07-04 DIAGNOSIS — C3491 Malignant neoplasm of unspecified part of right bronchus or lung: Secondary | ICD-10-CM

## 2022-07-07 ENCOUNTER — Inpatient Hospital Stay: Payer: Medicare HMO

## 2022-07-07 ENCOUNTER — Inpatient Hospital Stay: Payer: Medicare HMO | Admitting: Hematology

## 2022-07-07 VITALS — BP 107/81 | HR 63 | Resp 18

## 2022-07-07 DIAGNOSIS — C3491 Malignant neoplasm of unspecified part of right bronchus or lung: Secondary | ICD-10-CM

## 2022-07-07 DIAGNOSIS — Z7189 Other specified counseling: Secondary | ICD-10-CM

## 2022-07-07 DIAGNOSIS — Z95828 Presence of other vascular implants and grafts: Secondary | ICD-10-CM

## 2022-07-07 DIAGNOSIS — Z5112 Encounter for antineoplastic immunotherapy: Secondary | ICD-10-CM | POA: Diagnosis not present

## 2022-07-07 LAB — CBC WITH DIFFERENTIAL (CANCER CENTER ONLY)
Abs Immature Granulocytes: 0.01 10*3/uL (ref 0.00–0.07)
Basophils Absolute: 0.1 10*3/uL (ref 0.0–0.1)
Basophils Relative: 1 %
Eosinophils Absolute: 0.1 10*3/uL (ref 0.0–0.5)
Eosinophils Relative: 1 %
HCT: 38.8 % — ABNORMAL LOW (ref 39.0–52.0)
Hemoglobin: 12 g/dL — ABNORMAL LOW (ref 13.0–17.0)
Immature Granulocytes: 0 %
Lymphocytes Relative: 25 %
Lymphs Abs: 1.2 10*3/uL (ref 0.7–4.0)
MCH: 25.5 pg — ABNORMAL LOW (ref 26.0–34.0)
MCHC: 30.9 g/dL (ref 30.0–36.0)
MCV: 82.6 fL (ref 80.0–100.0)
Monocytes Absolute: 0.8 10*3/uL (ref 0.1–1.0)
Monocytes Relative: 16 %
Neutro Abs: 2.6 10*3/uL (ref 1.7–7.7)
Neutrophils Relative %: 57 %
Platelet Count: 358 10*3/uL (ref 150–400)
RBC: 4.7 MIL/uL (ref 4.22–5.81)
RDW: 20.4 % — ABNORMAL HIGH (ref 11.5–15.5)
WBC Count: 4.7 10*3/uL (ref 4.0–10.5)
nRBC: 0 % (ref 0.0–0.2)

## 2022-07-07 LAB — CMP (CANCER CENTER ONLY)
ALT: 9 U/L (ref 0–44)
AST: 11 U/L — ABNORMAL LOW (ref 15–41)
Albumin: 4.3 g/dL (ref 3.5–5.0)
Alkaline Phosphatase: 90 U/L (ref 38–126)
Anion gap: 5 (ref 5–15)
BUN: 8 mg/dL (ref 6–20)
CO2: 29 mmol/L (ref 22–32)
Calcium: 9.1 mg/dL (ref 8.9–10.3)
Chloride: 103 mmol/L (ref 98–111)
Creatinine: 0.68 mg/dL (ref 0.61–1.24)
GFR, Estimated: >60 mL/min (ref >60)
Glucose, Bld: 84 mg/dL (ref 70–99)
Potassium: 4.1 mmol/L (ref 3.5–5.1)
Sodium: 137 mmol/L (ref 135–145)
Total Bilirubin: 0.3 mg/dL (ref 0.3–1.2)
Total Protein: 6.5 g/dL (ref 6.5–8.1)

## 2022-07-07 LAB — TSH: TSH: 1.863 u[IU]/mL (ref 0.350–4.500)

## 2022-07-07 MED ORDER — SODIUM CHLORIDE 0.9 % IV SOLN
1200.0000 mg | Freq: Once | INTRAVENOUS | Status: AC
  Administered 2022-07-07: 1200 mg via INTRAVENOUS
  Filled 2022-07-07: qty 20

## 2022-07-07 MED ORDER — SODIUM CHLORIDE 0.9% FLUSH
10.0000 mL | INTRAVENOUS | Status: DC | PRN
  Administered 2022-07-07: 10 mL

## 2022-07-07 MED ORDER — ACETAMINOPHEN 325 MG PO TABS
650.0000 mg | ORAL_TABLET | Freq: Once | ORAL | Status: AC
  Administered 2022-07-07: 650 mg via ORAL
  Filled 2022-07-07: qty 2

## 2022-07-07 MED ORDER — DIPHENHYDRAMINE HCL 25 MG PO CAPS
25.0000 mg | ORAL_CAPSULE | Freq: Once | ORAL | Status: AC
  Administered 2022-07-07: 25 mg via ORAL
  Filled 2022-07-07: qty 1

## 2022-07-07 MED ORDER — SODIUM CHLORIDE 0.9 % IV SOLN: Freq: Once | INTRAVENOUS | Status: AC

## 2022-07-07 MED ORDER — HEPARIN SOD (PORK) LOCK FLUSH 100 UNIT/ML IV SOLN
500.0000 [IU] | Freq: Once | INTRAVENOUS | Status: AC | PRN
  Administered 2022-07-07: 500 [IU]

## 2022-07-07 MED ORDER — FAMOTIDINE 20 MG PO TABS
20.0000 mg | ORAL_TABLET | Freq: Once | ORAL | Status: AC
  Administered 2022-07-07: 20 mg via ORAL
  Filled 2022-07-07: qty 1

## 2022-07-07 NOTE — Patient Instructions (Signed)
[facility]  Discharge Instructions: Thank you for choosing [facility] to provide your oncology and hematology care.   If you have a lab appointment with the [facility], please go directly to the [facility] and check in at the registration area.   Wear comfortable clothing and clothing appropriate for easy access to any Portacath or PICC line.   We strive to give you quality time with your provider. You may need to reschedule your appointment if you arrive late (15 or more minutes).  Arriving late affects you and other patients whose appointments are after yours.  Also, if you miss three or more appointments without notifying the office, you may be dismissed from the clinic at the provider's discretion.      For prescription refill requests, have your pharmacy contact our office and allow 72 hours for refills to be completed.    Today you received the following chemotherapy and/or immunotherapy agents Tecentriq      To help prevent nausea and vomiting after your treatment, we encourage you to take your nausea medication as directed.  BELOW ARE SYMPTOMS THAT SHOULD BE REPORTED IMMEDIATELY: *FEVER GREATER THAN 100.4 F (38 C) OR HIGHER *CHILLS OR SWEATING *NAUSEA AND VOMITING THAT IS NOT CONTROLLED WITH YOUR NAUSEA MEDICATION *UNUSUAL SHORTNESS OF BREATH *UNUSUAL BRUISING OR BLEEDING *URINARY PROBLEMS (pain or burning when urinating, or frequent urination) *BOWEL PROBLEMS (unusual diarrhea, constipation, pain near the anus) TENDERNESS IN MOUTH AND THROAT WITH OR WITHOUT PRESENCE OF ULCERS (sore throat, sores in mouth, or a toothache) UNUSUAL RASH, SWELLING OR PAIN  UNUSUAL VAGINAL DISCHARGE OR ITCHING   Items with * indicate a potential emergency and should be followed up as soon as possible or go to the Emergency Department if any problems should occur.  Please show the CHEMOTHERAPY ALERT CARD or IMMUNOTHERAPY ALERT CARD at  check-in to the Emergency Department and triage nurse.  Should you have questions after your visit or need to cancel or reschedule your appointment, please contact [facility]  Dept: [phone]  and follow the prompts.  Office hours are 8:00 a.m. to 4:30 p.m. Monday - Friday. Please note that voicemails left after 4:00 p.m. may not be returned until the following business day.  We are closed weekends and major holidays. You have access to a nurse at all times for urgent questions. Please call the main number to the clinic Dept: [phone] and follow the prompts.   For any non-urgent questions, you may also contact your provider using MyChart. We now offer e-Visits for anyone 90 and older to request care online for non-urgent symptoms. For details visit [email or web address].   Also download the MyChart app! Go to the app store, search "MyChart", open the app, select [facility], and log in with your MyChart username and password.  Iron Sucrose Injection What is this medication? IRON SUCROSE (EYE ern SOO krose) treats low levels of iron (iron deficiency anemia) in people with kidney disease. Iron is a mineral that plays an important role in making red blood cells, which carry oxygen from your lungs to the rest of your body. This medicine may be used for other purposes; ask your health care provider or pharmacist if you have questions. COMMON BRAND NAME(S): Venofer What should I tell my care team before I take this medication? They need to know if you have any of these conditions: Anemia not caused by low iron levels Heart  disease High levels of iron in the blood Kidney disease Liver disease An unusual or allergic reaction to iron, other medications, foods, dyes, or preservatives Pregnant or trying to get pregnant Breastfeeding How should I use this medication? This medication is for infusion into a vein. It is given in a hospital or clinic  setting. Talk to your care team about the use of this medication in children. While this medication may be prescribed for children as young as 2 years for selected conditions, precautions do apply. Overdosage: If you think you have taken too much of this medicine contact a poison control center or emergency room at once. NOTE: This medicine is only for you. Do not share this medicine with others. What if I miss a dose? Keep appointments for follow-up doses. It is important not to miss your dose. Call your care team if you are unable to keep an appointment. What may interact with this medication? Do not take this medication with any of the following: Deferoxamine Dimercaprol Other iron products This medication may also interact with the following: Chloramphenicol Deferasirox This list may not describe all possible interactions. Give your health care provider a list of all the medicines, herbs, non-prescription drugs, or dietary supplements you use. Also tell them if you smoke, drink alcohol, or use illegal drugs. Some items may interact with your medicine. What should I watch for while using this medication? Visit your care team regularly. Tell your care team if your symptoms do not start to get better or if they get worse. You may need blood work done while you are taking this medication. You may need to follow a special diet. Talk to your care team. Foods that contain iron include: whole grains/cereals, dried fruits, beans, or peas, leafy green vegetables, and organ meats (liver, kidney). What side effects may I notice from receiving this medication? Side effects that you should report to your care team as soon as possible: Allergic reactions--skin rash, itching, hives, swelling of the face, lips, tongue, or throat Low blood pressure--dizziness, feeling faint or lightheaded, blurry vision Shortness of breath Side effects that usually do not require medical attention (report to your care team  if they continue or are bothersome): Flushing Headache Joint pain Muscle pain Nausea Pain, redness, or irritation at injection site This list may not describe all possible side effects. Call your doctor for medical advice about side effects. You may report side effects to FDA at 1-800-FDA-1088. Where should I keep my medication? This medication is given in a hospital or clinic and will not be stored at home. NOTE: This sheet is a summary. It may not cover all possible information. If you have questions about this medicine, talk to your doctor, pharmacist, or health care provider.  2023 Elsevier/Gold Standard (2020-08-07 00:00:00)  

## 2022-07-07 NOTE — Progress Notes (Signed)
Patient seen by MD today  Vitals are within treatment parameters.  Labs reviewed: and are within treatment parameters.  Per physician team, patient is ready for treatment and there are NO modifications to the treatment plan.

## 2022-07-13 ENCOUNTER — Encounter: Payer: Self-pay | Admitting: Hematology

## 2022-07-13 NOTE — Progress Notes (Signed)
HEMATOLOGY/ONCOLOGY CLINIC NOTE  Date of Service: 07/07/2022    Patient Care Team: [name], MD as PCP - General (Family Medicine)   CHIEF COMPLAINTS/PURPOSE OF CONSULTATION:  Follow-up for continued evaluation and management of metastatic lung adenocarcinoma  HISTORY OF PRESENTING ILLNESS:  Please see previous notes for details on initial presentation.  Current Treatment:  Atezolizumab maintenance   INTERVAL HISTORY:   David Berry. Is a 57 y.o. male here for continued evaluation and management of his metastatic lung adenocarcinoma.   He continues to be on maintenance atezolizumab. No new fevers chills or night sweats.  No new lumps or bumps. No new shortness of breath or chest pain. Still having some intermittent hemorrhoidal bleeding. No new toxicities from his atezolizumab.  Labs done today were reviewed in detail with him.  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[place])    pt denies this   GERD (gastroesophageal reflux disease)    Lung cancer ([place]) dx'd 01/2019   Peripheral vascular disease ([place])    blood clot in neck Sept 12, 2020    SURGICAL HISTORY: Past Surgical History:  Procedure Laterality Date   APPENDECTOMY     teenager   INGUINAL HERNIA REPAIR Right 2016, 2017   [place], [facility]-removed mesh   IR IMAGING GUIDED PORT INSERTION  04/26/2019   TOOTH EXTRACTION N/A 03/14/2020   Procedure: DENTAL RESTORATION/EXTRACTIONS;  Surgeon: Diona Browner, DDS;  Location: [place];  Service: Oral Surgery;  Laterality: N/A;    SOCIAL HISTORY: Social History   Socioeconomic History   Marital status: Single    Spouse name: Not on file   Number of children: 1   Years of education: GED   Highest education level: Some college, no degree  Occupational History    Comment: fork lift driver  Tobacco Use    Smoking status: Former    Types: Cigars    Quit date: 01/20/2019    Years since quitting: 3.4   Smokeless tobacco: Never   Tobacco comments:    smokes 3 black and mild cigars per day  Vaping Use   Vaping Use: Never used  Substance and Sexual Activity   Alcohol use: Not Currently   Drug use: No   Sexual activity: Yes  Other Topics Concern   Not on file  Social History Narrative   Lives alone   Caffeine- coffee, 20 oz daily, tea occas   Social Determinants of Health   Financial Resource Strain: Low Risk  (09/29/2020)   Overall Financial Resource Strain (CARDIA)    Difficulty of Paying Living Expenses: Not very hard  Food Insecurity: No Food Insecurity (09/29/2020)   Hunger Vital Sign    Worried About Running Out of Food in the Last Year: Never true    Ran Out of Food in the Last Year: Never true  Transportation Needs: No Transportation Needs (09/29/2020)   PRAPARE - Hydrologist (Medical): No    Lack of Transportation (Non-Medical): No  Physical Activity: Sufficiently Active (09/29/2020)   Exercise Vital Sign    Days of Exercise per Week: 3 days    Minutes of Exercise per Session: 150+ min  Stress: No Stress Concern Present (09/29/2020)   [place]  Feeling of Stress : Not at all  Social Connections: Socially Isolated (09/29/2020)   Social Connection and Isolation Panel [NHANES]    Frequency of Communication with Friends and Family: More than three times a week    Frequency of Social Gatherings with Friends and Family: Once a week    Attends Religious Services: Never    Marine scientist or Organizations: No    Attends Music therapist: Not on file    Marital Status: Never married  Human resources officer Violence: Not on file    FAMILY HISTORY: Family History  Problem Relation Age of Onset   Prostate cancer Father    Colon cancer Neg Hx    Stomach cancer Neg Hx     Esophageal cancer Neg Hx     ALLERGIES:  is allergic to atorvastatin calcium.  MEDICATIONS:  Current Outpatient Medications  Medication Sig Dispense Refill   Cyanocobalamin (VITAMIN B 12 PO) Take 2,500 mcg by mouth daily.     ELIQUIS 5 MG TABS tablet TAKE 1 TABLET(5 MG) BY MOUTH TWICE DAILY 60 tablet 11   esomeprazole (NEXIUM) 40 MG capsule TAKE 1 CAPSULE(40 MG) BY MOUTH DAILY 90 capsule 2   hydrocortisone (ANUSOL-HC) 2.5 % rectal cream Use twice a day for 7 days 30 g 1   lactose free nutrition (BOOST PLUS) LIQD Drink one carton (237 ml) Boost Plus/equivalent three times daily in between meals  0   lidocaine-prilocaine (EMLA) cream Apply 1 application. topically as needed. 30 g 0   LIVALO 1 MG TABS Take 1 tablet by mouth at bedtime.     LORazepam (ATIVAN) 0.5 MG tablet Take 1 tablet (0.5 mg total) by mouth every 8 (eight) hours. 30 tablet 0   propranolol (INDERAL) 20 MG tablet TAKE 1 TABLET(20 MG) BY MOUTH TWICE DAILY 60 tablet 5   rosuvastatin (CRESTOR) 10 MG tablet Take 10 mg by mouth at bedtime.     sildenafil (VIAGRA) 50 MG tablet Take 1 tablet (50 mg total) by mouth daily as needed for erectile dysfunction. Take 30 mins to 4 hours prior to sexual activity 30 tablet 1   No current facility-administered medications for this visit.   REVIEW OF SYSTEMS:   .10 Point review of Systems was done is negative except as noted above.   PHYSICAL EXAMINATION: ECOG FS:1 - Symptomatic but completely ambulatory .BP 124/70   Pulse 72   Temp (!) 97.5 F (36.4 C)   Resp 20   Wt 136 lb 6.4 oz (61.9 kg)   SpO2 99%   BMI 19.02 kg/m    Wt Readings from Last 3 Encounters:  07/07/22 136 lb 6.4 oz (61.9 kg)  06/16/22 131 lb 12 oz (59.8 kg)  06/09/22 133 lb 1.6 oz (60.4 kg)   Body mass index is 19.02 kg/m.   NAD GENERAL:alert, in no acute distress and comfortable SKIN: no acute rashes, no significant lesions EYES: conjunctiva are pink and non-injected, sclera anicteric OROPHARYNX: MMM,  no exudates, no oropharyngeal erythema or ulceration NECK: supple, no JVD LYMPH:  no palpable lymphadenopathy in the cervical, axillary or inguinal regions LUNGS: clear to auscultation b/l with normal respiratory effort HEART: regular rate & rhythm ABDOMEN:  normoactive bowel sounds , non tender, not distended. Extremity: no pedal edema PSYCH: alert & oriented x 3 with fluent speech NEURO: no focal motor/sensory deficits  LABORATORY DATA:  I have reviewed the data as listed  .    Latest Ref Rng & Units 07/07/2022  1:12 PM 06/16/2022   12:46 PM 05/26/2022    1:08 PM  CBC  WBC 4.0 - 10.5 K/uL 4.7  4.4  5.7   Hemoglobin 13.0 - 17.0 g/dL 12.0  10.4  9.6   Hematocrit 39.0 - 52.0 % 38.8  33.9  31.2   Platelets 150 - 400 K/uL 358  286  397     .    Latest Ref Rng & Units 07/07/2022    1:12 PM 06/16/2022   12:46 PM 05/26/2022    1:08 PM  CMP  Glucose 70 - 99 mg/dL 84  95  91   BUN 6 - 20 mg/dL '8  11  9   '$ Creatinine 0.61 - 1.24 mg/dL 0.68  0.74  0.79   Sodium 135 - 145 mmol/L 137  139  137   Potassium 3.5 - 5.1 mmol/L 4.1  4.4  4.2   Chloride 98 - 111 mmol/L 103  105  105   CO2 22 - 32 mmol/L '29  29  28   '$ Calcium 8.9 - 10.3 mg/dL 9.1  9.7  9.1   Total Protein 6.5 - 8.1 g/dL 6.5  7.0  6.4   Total Bilirubin 0.3 - 1.2 mg/dL 0.3  0.2  0.2   Alkaline Phos 38 - 126 U/L 90  107  88   AST 15 - 41 U/L '11  16  17   '$ ALT 0 - 44 U/L '9  11  11    '$ . Lab Results  Component Value Date   IRON 16 (L) 05/05/2022   TIBC 448 05/05/2022   IRONPCTSAT 4 (L) 05/05/2022   (Iron and TIBC)  Lab Results  Component Value Date   FERRITIN 7 (L) 05/05/2022     01/22/2019 Foundation One: Tumor Mutational Burden     01/22/2019 PD-L1 Immunohistochemistry Analysis    01/22/2019 Soft Tissue Needle Core Biopsy Surgical Pathology     RADIOGRAPHIC STUDIES: I have personally reviewed the radiological images as listed and agreed with the findings in the report.   .No results found.   ASSESSMENT  & PLAN:   This is a 57 year old male with   1. Metastatic Poorly differentiated lung adenocarcinoma Presented with Large neck mass, mediastinal mass, renal mass, and questionable bone lesions  no brain mets on MRI brain 01/22/2019 PD-L1 Immunohistochemistry Analysis which revealed "Tumor Proportion Score (TPS) 50%" 05/16/2019 PET/CT ([id number]) revealed "Radiation changes in the right hemithorax, as above. Improving mediastinal nodal metastases. Prior bulky right cervical metastases have resolved. No evidence of metastatic disease in the abdomen/pelvis." 07/19/2019 Esophagus Scan ([id number]) revealed "Mild stricture at the C6-7 level likely related to prior esophageal surgery. Barium tablet was slow to pass through this area but did pass through after 1 minutes. Hiatal hernia with moderate gastroesophageal reflux. Moderate stricture above the hiatal hernia likely due to reflux. Barium tablet did not pass this area. There are changes of esophagitis which are likely due to reflux and possibly radiation as well."  10/10/19 of  CT Chest W Contrast ([id number])- no evidence of lung cancer progression at this time. He did have a PET CT scan on 04/13/2021 which was reviewed on the previous visit by Murray Hodgkins.  This showed some borderline FDG avid right paratracheal right hilar and azygoesophageal lymph nodes that were suspicious for possible nodal recurrence however the patient notes that he did have a bad upper respiratory tract infection around the time that he had his PET CT scan and therefore reactive adenopathy is a possibility as  well.    2. 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 Symptomatic hemorrhoids-currently no significant bleeding but have caused some mild iron deficiency anemia.  5. Normocytic anemia -Likely due to underlying malignancy   6.  S/pThrombocytosis -- likely due to paraneoplastic effect of tumor and reactive due to inflammation and tissue inflammation from RT.-- now resolved.   7. Nicotine dependence -has quit since cancer diagnosis  8. Iron deficiency - likely due to blood loss from hemorrhoids  9.  Erectile dysfunction-having partial benefits with using Viagra.  PLAN:  -Discussed lab results from today. CBC shows improved hemoglobin of 12 up from 9.6 a month ago.  WBC count and platelets within normal limits. CMP stable TSH within normal limits at 1.86 Patient has no lab or clinical evidence of lung cancer recurrence/progression at this time. He has no notable toxicities from his atezolizumab immunotherapy and we shall continue the same for maintenance every 3 weeks. -Will get a repeat CT chest abdomen pelvis in 5 weeks MD visit in 6 weeks  FOLLOW UP: CT chest abdomen pelvis in 5 weeks MD visit in 6 weeks Continue atezolizumab for integrated scheduling  The total time spent in the appointment was 22 minutes*.  All of the patient's questions were answered with apparent satisfaction. The patient knows to call the clinic with any problems, questions or concerns.   Sullivan Lone MD MS AAHIVMS [facility] Hematology/Oncology Physician [facility]  .*Total Encounter Time as defined by the Centers for Medicare and Medicaid Services includes, in addition to the face-to-face time of a patient visit (documented in the note above) non-face-to-face time: obtaining and reviewing outside history, ordering and reviewing medications, tests or procedures, care coordination (communications with other health care professionals or caregivers) and documentation in the medical record.

## 2022-07-14 ENCOUNTER — Other Ambulatory Visit: Payer: Self-pay

## 2022-07-28 ENCOUNTER — Inpatient Hospital Stay: Payer: Medicare HMO | Attending: Physician Assistant

## 2022-07-28 ENCOUNTER — Inpatient Hospital Stay: Payer: Medicare HMO

## 2022-07-28 ENCOUNTER — Inpatient Hospital Stay: Payer: Medicare HMO | Admitting: Hematology

## 2022-07-28 VITALS — BP 93/65 | HR 63 | Temp 97.7°F | Resp 18 | Ht 71.0 in | Wt 133.8 lb

## 2022-07-28 DIAGNOSIS — C3491 Malignant neoplasm of unspecified part of right bronchus or lung: Secondary | ICD-10-CM

## 2022-07-28 DIAGNOSIS — C771 Secondary and unspecified malignant neoplasm of intrathoracic lymph nodes: Secondary | ICD-10-CM | POA: Insufficient documentation

## 2022-07-28 DIAGNOSIS — Z7189 Other specified counseling: Secondary | ICD-10-CM

## 2022-07-28 DIAGNOSIS — Z5112 Encounter for antineoplastic immunotherapy: Secondary | ICD-10-CM | POA: Diagnosis not present

## 2022-07-28 DIAGNOSIS — Z79899 Other long term (current) drug therapy: Secondary | ICD-10-CM | POA: Diagnosis not present

## 2022-07-28 DIAGNOSIS — Z86718 Personal history of other venous thrombosis and embolism: Secondary | ICD-10-CM | POA: Diagnosis not present

## 2022-07-28 DIAGNOSIS — Z95828 Presence of other vascular implants and grafts: Secondary | ICD-10-CM

## 2022-07-28 DIAGNOSIS — D509 Iron deficiency anemia, unspecified: Secondary | ICD-10-CM | POA: Diagnosis not present

## 2022-07-28 DIAGNOSIS — Z7901 Long term (current) use of anticoagulants: Secondary | ICD-10-CM | POA: Insufficient documentation

## 2022-07-28 LAB — CBC WITH DIFFERENTIAL (CANCER CENTER ONLY)
Abs Immature Granulocytes: 0 10*3/uL (ref 0.00–0.07)
Basophils Absolute: 0 10*3/uL (ref 0.0–0.1)
Basophils Relative: 1 %
Eosinophils Absolute: 0 10*3/uL (ref 0.0–0.5)
Eosinophils Relative: 1 %
HCT: 39.4 % (ref 39.0–52.0)
Hemoglobin: 12.6 g/dL — ABNORMAL LOW (ref 13.0–17.0)
Immature Granulocytes: 0 %
Lymphocytes Relative: 28 %
Lymphs Abs: 1 10*3/uL (ref 0.7–4.0)
MCH: 26.8 pg (ref 26.0–34.0)
MCHC: 32 g/dL (ref 30.0–36.0)
MCV: 83.7 fL (ref 80.0–100.0)
Monocytes Absolute: 0.5 10*3/uL (ref 0.1–1.0)
Monocytes Relative: 15 %
Neutro Abs: 2 10*3/uL (ref 1.7–7.7)
Neutrophils Relative %: 55 %
Platelet Count: 239 10*3/uL (ref 150–400)
RBC: 4.71 MIL/uL (ref 4.22–5.81)
RDW: 20.1 % — ABNORMAL HIGH (ref 11.5–15.5)
WBC Count: 3.7 10*3/uL — ABNORMAL LOW (ref 4.0–10.5)
nRBC: 0 % (ref 0.0–0.2)

## 2022-07-28 LAB — CMP (CANCER CENTER ONLY)
ALT: 9 U/L (ref 0–44)
AST: 12 U/L — ABNORMAL LOW (ref 15–41)
Albumin: 4.3 g/dL (ref 3.5–5.0)
Alkaline Phosphatase: 91 U/L (ref 38–126)
Anion gap: 3 — ABNORMAL LOW (ref 5–15)
BUN: 6 mg/dL (ref 6–20)
CO2: 29 mmol/L (ref 22–32)
Calcium: 9.6 mg/dL (ref 8.9–10.3)
Chloride: 104 mmol/L (ref 98–111)
Creatinine: 0.73 mg/dL (ref 0.61–1.24)
GFR, Estimated: >60 mL/min (ref >60)
Glucose, Bld: 96 mg/dL (ref 70–99)
Potassium: 4.2 mmol/L (ref 3.5–5.1)
Sodium: 136 mmol/L (ref 135–145)
Total Bilirubin: 0.3 mg/dL (ref 0.3–1.2)
Total Protein: 6.9 g/dL (ref 6.5–8.1)

## 2022-07-28 LAB — TSH: TSH: 1.657 u[IU]/mL (ref 0.350–4.500)

## 2022-07-28 MED ORDER — SODIUM CHLORIDE 0.9 % IV SOLN
1200.0000 mg | Freq: Once | INTRAVENOUS | Status: AC
  Administered 2022-07-28: 1200 mg via INTRAVENOUS
  Filled 2022-07-28: qty 20

## 2022-07-28 MED ORDER — SODIUM CHLORIDE 0.9 % IV SOLN: Freq: Once | INTRAVENOUS | Status: AC

## 2022-07-28 MED ORDER — DIPHENHYDRAMINE HCL 25 MG PO CAPS
25.0000 mg | ORAL_CAPSULE | Freq: Once | ORAL | Status: AC
  Administered 2022-07-28: 25 mg via ORAL
  Filled 2022-07-28: qty 1

## 2022-07-28 MED ORDER — ACETAMINOPHEN 325 MG PO TABS
650.0000 mg | ORAL_TABLET | Freq: Once | ORAL | Status: AC
  Administered 2022-07-28: 650 mg via ORAL
  Filled 2022-07-28: qty 2

## 2022-07-28 MED ORDER — HEPARIN SOD (PORK) LOCK FLUSH 100 UNIT/ML IV SOLN
500.0000 [IU] | Freq: Once | INTRAVENOUS | Status: AC | PRN
  Administered 2022-07-28: 500 [IU]

## 2022-07-28 MED ORDER — SODIUM CHLORIDE 0.9% FLUSH
10.0000 mL | INTRAVENOUS | Status: DC | PRN
  Administered 2022-07-28: 10 mL

## 2022-07-28 MED ORDER — SODIUM CHLORIDE 0.9% FLUSH
10.0000 mL | Freq: Once | INTRAVENOUS | Status: AC | PRN
  Administered 2022-07-28: 10 mL

## 2022-07-28 MED ORDER — FAMOTIDINE 20 MG PO TABS
20.0000 mg | ORAL_TABLET | Freq: Once | ORAL | Status: AC
  Administered 2022-07-28: 20 mg via ORAL
  Filled 2022-07-28: qty 1

## 2022-07-28 NOTE — Progress Notes (Incomplete)
HEMATOLOGY/ONCOLOGY CLINIC NOTE  Date of Service: 07/28/22    Patient Care Team: [name], MD as PCP - General (Family Medicine)   CHIEF COMPLAINTS/PURPOSE OF CONSULTATION:  Follow-up for continued evaluation and management of metastatic lung adenocarcinoma  HISTORY OF PRESENTING ILLNESS:  Please see previous notes for details on initial presentation.  Current Treatment:  Atezolizumab maintenance   INTERVAL HISTORY:   David Berry. Is a 57 y.o. male here for continued evaluation and management of his metastatic lung adenocarcinoma. Patient was last seen by me on 07/07/2022 and complained of intermittent hemorrhoidal bleeding, but was otherwise doing well overall.  Today,   -Discussed lab results on 07/28/2022 with patient in detail. CBC showed WBC of ***K, hemoglobin of ***, and platelets of ***K. -  No new toxicities from his atezolizumab.   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[place])    pt denies this   GERD (gastroesophageal reflux disease)    Lung cancer ([place]) dx'd 01/2019   Peripheral vascular disease ([place])    blood clot in neck Sept 12, 2020    SURGICAL HISTORY: Past Surgical History:  Procedure Laterality Date   APPENDECTOMY     teenager   INGUINAL HERNIA REPAIR Right 2016, 2017   [place], 2018 [facility]-removed mesh   IR IMAGING GUIDED PORT INSERTION  04/26/2019   TOOTH EXTRACTION N/A 03/14/2020   Procedure: DENTAL RESTORATION/EXTRACTIONS;  Surgeon: Diona Browner, DDS;  Location: [place];  Service: Oral Surgery;  Laterality: N/A;    SOCIAL HISTORY: Social History   Socioeconomic History   Marital status: Single    Spouse name: Not on file   Number of children: 1   Years of education: GED   Highest education level: Some college, no degree  Occupational History    Comment: fork lift driver   Tobacco Use   Smoking status: Former    Types: Cigars    Quit date: 01/20/2019    Years since quitting: 3.5   Smokeless tobacco: Never   Tobacco comments:    smokes 3 black and mild cigars per day  Vaping Use   Vaping Use: Never used  Substance and Sexual Activity   Alcohol use: Not Currently   Drug use: No   Sexual activity: Yes  Other Topics Concern   Not on file  Social History Narrative   Lives alone   Caffeine- coffee, 20 oz daily, tea occas   Social Determinants of Health   Financial Resource Strain: Low Risk  (09/29/2020)   Overall Financial Resource Strain (CARDIA)    Difficulty of Paying Living Expenses: Not very hard  Food Insecurity: No Food Insecurity (09/29/2020)   Hunger Vital Sign    Worried About Running Out of Food in the Last Year: Never true    Ran Out of Food in the Last Year: Never true  Transportation Needs: No Transportation Needs (09/29/2020)   PRAPARE - Hydrologist (Medical): No    Lack of Transportation (Non-Medical): No  Physical Activity: Sufficiently Active (09/29/2020)   Exercise Vital Sign    Days of Exercise per Week: 3 days    Minutes of Exercise per Session: 150+ min  Stress: No Stress Concern Present (09/29/2020)   [place]  Questionnaire    Feeling of Stress : Not at all  Social Connections: Socially Isolated (09/29/2020)   Social Connection and Isolation Panel [NHANES]    Frequency of Communication with Friends and Family: More than three times a week    Frequency of Social Gatherings with Friends and Family: Once a week    Attends Religious Services: Never    Marine scientist or Organizations: No    Attends Music therapist: Not on file    Marital Status: Never married  Human resources officer Violence: Not on file    FAMILY HISTORY: Family History  Problem Relation Age of Onset   Prostate cancer Father    Colon cancer Neg Hx    Stomach  cancer Neg Hx    Esophageal cancer Neg Hx     ALLERGIES:  is allergic to atorvastatin calcium.  MEDICATIONS:  Current Outpatient Medications  Medication Sig Dispense Refill   Cyanocobalamin (VITAMIN B 12 PO) Take 2,500 mcg by mouth daily.     ELIQUIS 5 MG TABS tablet TAKE 1 TABLET(5 MG) BY MOUTH TWICE DAILY 60 tablet 11   esomeprazole (NEXIUM) 40 MG capsule TAKE 1 CAPSULE(40 MG) BY MOUTH DAILY 90 capsule 2   hydrocortisone (ANUSOL-HC) 2.5 % rectal cream Use twice a day for 7 days 30 g 1   lactose free nutrition (BOOST PLUS) LIQD Drink one carton (237 ml) Boost Plus/equivalent three times daily in between meals  0   lidocaine-prilocaine (EMLA) cream Apply 1 application. topically as needed. 30 g 0   LIVALO 1 MG TABS Take 1 tablet by mouth at bedtime.     LORazepam (ATIVAN) 0.5 MG tablet Take 1 tablet (0.5 mg total) by mouth every 8 (eight) hours. 30 tablet 0   propranolol (INDERAL) 20 MG tablet TAKE 1 TABLET(20 MG) BY MOUTH TWICE DAILY 60 tablet 5   rosuvastatin (CRESTOR) 10 MG tablet Take 10 mg by mouth at bedtime.     sildenafil (VIAGRA) 50 MG tablet Take 1 tablet (50 mg total) by mouth daily as needed for erectile dysfunction. Take 30 mins to 4 hours prior to sexual activity 30 tablet 1   No current facility-administered medications for this visit.   REVIEW OF SYSTEMS:    10 Point review of Systems was done is negative except as noted above.   PHYSICAL EXAMINATION: ECOG FS:1 - Symptomatic but completely ambulatory .There were no vitals taken for this visit.   Wt Readings from Last 3 Encounters:  07/07/22 136 lb 6.4 oz (61.9 kg)  06/16/22 131 lb 12 oz (59.8 kg)  06/09/22 133 lb 1.6 oz (60.4 kg)   There is no height or weight on file to calculate BMI.     GENERAL:alert, in no acute distress and comfortable SKIN: no acute rashes, no significant lesions EYES: conjunctiva are pink and non-injected, sclera anicteric OROPHARYNX: MMM, no exudates, no oropharyngeal erythema or  ulceration NECK: supple, no JVD LYMPH:  no palpable lymphadenopathy in the cervical, axillary or inguinal regions LUNGS: clear to auscultation b/l with normal respiratory effort HEART: regular rate & rhythm ABDOMEN:  normoactive bowel sounds , non tender, not distended. Extremity: no pedal edema PSYCH: alert & oriented x 3 with fluent speech NEURO: no focal motor/sensory deficits   LABORATORY DATA:  I have reviewed the data as listed  .    Latest Ref Rng & Units 07/07/2022    1:12 PM 06/16/2022   12:46 PM 05/26/2022    1:08 PM  CBC  WBC  4.0 - 10.5 K/uL 4.7  4.4  5.7   Hemoglobin 13.0 - 17.0 g/dL 12.0  10.4  9.6   Hematocrit 39.0 - 52.0 % 38.8  33.9  31.2   Platelets 150 - 400 K/uL 358  286  397     .    Latest Ref Rng & Units 07/07/2022    1:12 PM 06/16/2022   12:46 PM 05/26/2022    1:08 PM  CMP  Glucose 70 - 99 mg/dL 84  95  91   BUN 6 - 20 mg/dL 8  11  9    Creatinine 0.61 - 1.24 mg/dL 0.68  0.74  0.79   Sodium 135 - 145 mmol/L 137  139  137   Potassium 3.5 - 5.1 mmol/L 4.1  4.4  4.2   Chloride 98 - 111 mmol/L 103  105  105   CO2 22 - 32 mmol/L 29  29  28    Calcium 8.9 - 10.3 mg/dL 9.1  9.7  9.1   Total Protein 6.5 - 8.1 g/dL 6.5  7.0  6.4   Total Bilirubin 0.3 - 1.2 mg/dL 0.3  0.2  0.2   Alkaline Phos 38 - 126 U/L 90  107  88   AST 15 - 41 U/L 11  16  17    ALT 0 - 44 U/L 9  11  11     . Lab Results  Component Value Date   IRON 16 (L) 05/05/2022   TIBC 448 05/05/2022   IRONPCTSAT 4 (L) 05/05/2022   (Iron and TIBC)  Lab Results  Component Value Date   FERRITIN 7 (L) 05/05/2022     01/22/2019 Foundation One: Tumor Mutational Burden     01/22/2019 PD-L1 Immunohistochemistry Analysis    01/22/2019 Soft Tissue Needle Core Biopsy Surgical Pathology     RADIOGRAPHIC STUDIES: I have personally reviewed the radiological images as listed and agreed with the findings in the report.   .No results found.   ASSESSMENT & PLAN:   This is a 57 year old male  with   1. Metastatic Poorly differentiated lung adenocarcinoma Presented with Large neck mass, mediastinal mass, renal mass, and questionable bone lesions  no brain mets on MRI brain 01/22/2019 PD-L1 Immunohistochemistry Analysis which revealed "Tumor Proportion Score (TPS) 50%" 05/16/2019 PET/CT ([id number]) revealed "Radiation changes in the right hemithorax, as above. Improving mediastinal nodal metastases. Prior bulky right cervical metastases have resolved. No evidence of metastatic disease in the abdomen/pelvis." 07/19/2019 Esophagus Scan ([id number]) revealed "Mild stricture at the C6-7 level likely related to prior esophageal surgery. Barium tablet was slow to pass through this area but did pass through after 1 minutes. Hiatal hernia with moderate gastroesophageal reflux. Moderate stricture above the hiatal hernia likely due to reflux. Barium tablet did not pass this area. There are changes of esophagitis which are likely due to reflux and possibly radiation as well."  10/10/19 of  CT Chest W Contrast ([id number])- no evidence of lung cancer progression at this time. He did have a PET CT scan on 04/13/2021 which was reviewed on the previous visit by Murray Hodgkins.  This showed some borderline FDG avid right paratracheal right hilar and azygoesophageal lymph nodes that were suspicious for possible nodal recurrence however the patient notes that he did have a bad upper respiratory tract infection around the time that he had his PET CT scan and therefore reactive adenopathy is a possibility as well.    2. 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 Symptomatic hemorrhoids-currently no significant bleeding but have caused some mild iron deficiency anemia.  5. Normocytic anemia -Likely due to underlying malignancy   6. S/pThrombocytosis -- likely due to  paraneoplastic effect of tumor and reactive due to inflammation and tissue inflammation from RT.-- now resolved.   7. Nicotine dependence -has quit since cancer diagnosis  8. Iron deficiency - likely due to blood loss from hemorrhoids  9.  Erectile dysfunction-having partial benefits with using Viagra.  PLAN:  -Discussed lab results from today. CBC shows improved hemoglobin of 12 up from 9.6 a month ago.  WBC count and platelets within normal limits. CMP stable TSH within normal limits at 1.86 Patient has no lab or clinical evidence of lung cancer recurrence/progression at this time. He has no notable toxicities from his atezolizumab immunotherapy and we shall continue the same for maintenance every 3 weeks. -Will get a repeat CT chest abdomen pelvis in 5 weeks MD visit in 6 weeks  FOLLOW-UP: ***  The total time spent in the appointment was *** minutes* .  All of the patient's questions were answered with apparent satisfaction. The patient knows to call the clinic with any problems, questions or concerns.   Sullivan Lone MD MS AAHIVMS [facility] Hematology/Oncology Physician [facility]  .*Total Encounter Time as defined by the Centers for Medicare and Medicaid Services includes, in addition to the face-to-face time of a patient visit (documented in the note above) non-face-to-face time: obtaining and reviewing outside history, ordering and reviewing medications, tests or procedures, care coordination (communications with other health care professionals or caregivers) and documentation in the medical record.    I,Mitra Faeizi,acting as a Education administrator for Sullivan Lone, MD.,have documented all relevant documentation on the behalf of Sullivan Lone, MD,as directed by  Sullivan Lone, MD while in the presence of Sullivan Lone, MD.  ***

## 2022-07-28 NOTE — Patient Instructions (Signed)
[facility]  Discharge Instructions: Thank you for choosing [facility] to provide your oncology and hematology care.   If you have a lab appointment with the Cancer Center, please go directly to the Cancer Center and check in at the registration area.   Wear comfortable clothing and clothing appropriate for easy access to any Portacath or PICC line.   We strive to give you quality time with your provider. You may need to reschedule your appointment if you arrive late (15 or more minutes).  Arriving late affects you and other patients whose appointments are after yours.  Also, if you miss three or more appointments without notifying the office, you may be dismissed from the clinic at the provider's discretion.      For prescription refill requests, have your pharmacy contact our office and allow 72 hours for refills to be completed.    Today you received the following chemotherapy and/or immunotherapy agents Tecentriq      To help prevent nausea and vomiting after your treatment, we encourage you to take your nausea medication as directed.  BELOW ARE SYMPTOMS THAT SHOULD BE REPORTED IMMEDIATELY: *FEVER GREATER THAN 100.4 F (38 C) OR HIGHER *CHILLS OR SWEATING *NAUSEA AND VOMITING THAT IS NOT CONTROLLED WITH YOUR NAUSEA MEDICATION *UNUSUAL SHORTNESS OF BREATH *UNUSUAL BRUISING OR BLEEDING *URINARY PROBLEMS (pain or burning when urinating, or frequent urination) *BOWEL PROBLEMS (unusual diarrhea, constipation, pain near the anus) TENDERNESS IN MOUTH AND THROAT WITH OR WITHOUT PRESENCE OF ULCERS (sore throat, sores in mouth, or a toothache) UNUSUAL RASH, SWELLING OR PAIN  UNUSUAL VAGINAL DISCHARGE OR ITCHING   Items with * indicate a potential emergency and should be followed up as soon as possible or go to the Emergency Department if any problems should occur.  Please show the CHEMOTHERAPY ALERT CARD or IMMUNOTHERAPY ALERT CARD at  check-in to the Emergency Department and triage nurse.  Should you have questions after your visit or need to cancel or reschedule your appointment, please contact [facility]  Dept: [phone]  and follow the prompts.  Office hours are 8:00 a.m. to 4:30 p.m. Monday - Friday. Please note that voicemails left after 4:00 p.m. may not be returned until the following business day.  We are closed weekends and major holidays. You have access to a nurse at all times for urgent questions. Please call the main number to the clinic Dept: [phone] and follow the prompts.   For any non-urgent questions, you may also contact your provider using MyChart. We now offer e-Visits for anyone 18 and older to request care online for non-urgent symptoms. For details visit [email or web address].   Also download the MyChart app! Go to the app store, search "MyChart", open the app, select [facility], and log in with your MyChart username and password.  Iron Sucrose Injection What is this medication? IRON SUCROSE (EYE ern SOO krose) treats low levels of iron (iron deficiency anemia) in people with kidney disease. Iron is a mineral that plays an important role in making red blood cells, which carry oxygen from your lungs to the rest of your body. This medicine may be used for other purposes; ask your health care provider or pharmacist if you have questions. COMMON BRAND NAME(S): Venofer What should I tell my care team before I take this medication? They need to know if you have any of these conditions: Anemia not caused by low iron levels Heart   disease High levels of iron in the blood Kidney disease Liver disease An unusual or allergic reaction to iron, other medications, foods, dyes, or preservatives Pregnant or trying to get pregnant Breastfeeding How should I use this medication? This medication is for infusion into a vein. It is given in a hospital or clinic  setting. Talk to your care team about the use of this medication in children. While this medication may be prescribed for children as young as 2 years for selected conditions, precautions do apply. Overdosage: If you think you have taken too much of this medicine contact a poison control center or emergency room at once. NOTE: This medicine is only for you. Do not share this medicine with others. What if I miss a dose? Keep appointments for follow-up doses. It is important not to miss your dose. Call your care team if you are unable to keep an appointment. What may interact with this medication? Do not take this medication with any of the following: Deferoxamine Dimercaprol Other iron products This medication may also interact with the following: Chloramphenicol Deferasirox This list may not describe all possible interactions. Give your health care provider a list of all the medicines, herbs, non-prescription drugs, or dietary supplements you use. Also tell them if you smoke, drink alcohol, or use illegal drugs. Some items may interact with your medicine. What should I watch for while using this medication? Visit your care team regularly. Tell your care team if your symptoms do not start to get better or if they get worse. You may need blood work done while you are taking this medication. You may need to follow a special diet. Talk to your care team. Foods that contain iron include: whole grains/cereals, dried fruits, beans, or peas, leafy green vegetables, and organ meats (liver, kidney). What side effects may I notice from receiving this medication? Side effects that you should report to your care team as soon as possible: Allergic reactions--skin rash, itching, hives, swelling of the face, lips, tongue, or throat Low blood pressure--dizziness, feeling faint or lightheaded, blurry vision Shortness of breath Side effects that usually do not require medical attention (report to your care team  if they continue or are bothersome): Flushing Headache Joint pain Muscle pain Nausea Pain, redness, or irritation at injection site This list may not describe all possible side effects. Call your doctor for medical advice about side effects. You may report side effects to FDA at 1-800-FDA-1088. Where should I keep my medication? This medication is given in a hospital or clinic and will not be stored at home. NOTE: This sheet is a summary. It may not cover all possible information. If you have questions about this medicine, talk to your doctor, pharmacist, or health care provider.  2023 Elsevier/Gold Standard (2020-08-07 00:00:00)  

## 2022-08-02 ENCOUNTER — Other Ambulatory Visit: Payer: Self-pay

## 2022-08-02 DIAGNOSIS — C3491 Malignant neoplasm of unspecified part of right bronchus or lung: Secondary | ICD-10-CM

## 2022-08-10 ENCOUNTER — Other Ambulatory Visit: Payer: Self-pay

## 2022-08-11 ENCOUNTER — Ambulatory Visit (HOSPITAL_COMMUNITY)
Admission: RE | Admit: 2022-08-11 | Discharge: 2022-08-11 | Disposition: A | Discharge status: Home / Self Care | Payer: Medicare HMO | Source: Ambulatory Visit | Attending: Hematology | Admitting: Hematology

## 2022-08-11 DIAGNOSIS — C3491 Malignant neoplasm of unspecified part of right bronchus or lung: Secondary | ICD-10-CM

## 2022-08-11 MED ORDER — HEPARIN SOD (PORK) LOCK FLUSH 100 UNIT/ML IV SOLN
500.0000 [IU] | Freq: Once | INTRAVENOUS | Status: AC
  Administered 2022-08-11: 500 [IU] via INTRAVENOUS

## 2022-08-11 MED ORDER — SODIUM CHLORIDE (PF) 0.9 % IJ SOLN
INTRAMUSCULAR | Status: AC
  Filled 2022-08-11: qty 50

## 2022-08-11 MED ORDER — HEPARIN SOD (PORK) LOCK FLUSH 100 UNIT/ML IV SOLN
INTRAVENOUS | Status: AC
  Filled 2022-08-11: qty 5

## 2022-08-11 MED ORDER — IOHEXOL 300 MG/ML  SOLN
100.0000 mL | Freq: Once | INTRAMUSCULAR | Status: AC | PRN
  Administered 2022-08-11: 100 mL via INTRAVENOUS

## 2022-08-18 ENCOUNTER — Inpatient Hospital Stay: Payer: Medicare HMO

## 2022-08-18 ENCOUNTER — Inpatient Hospital Stay (HOSPITAL_BASED_OUTPATIENT_CLINIC_OR_DEPARTMENT_OTHER): Payer: Medicare HMO | Admitting: Hematology

## 2022-08-18 ENCOUNTER — Inpatient Hospital Stay: Payer: Medicare HMO | Attending: Physician Assistant

## 2022-08-18 VITALS — BP 103/52 | HR 66 | Temp 97.7°F | Resp 20 | Wt 136.3 lb

## 2022-08-18 VITALS — BP 129/91 | HR 78 | Temp 98.6°F | Resp 18

## 2022-08-18 DIAGNOSIS — Z7189 Other specified counseling: Secondary | ICD-10-CM

## 2022-08-18 DIAGNOSIS — Z79899 Other long term (current) drug therapy: Secondary | ICD-10-CM | POA: Diagnosis not present

## 2022-08-18 DIAGNOSIS — C3491 Malignant neoplasm of unspecified part of right bronchus or lung: Secondary | ICD-10-CM

## 2022-08-18 DIAGNOSIS — Z7901 Long term (current) use of anticoagulants: Secondary | ICD-10-CM | POA: Diagnosis not present

## 2022-08-18 DIAGNOSIS — N529 Male erectile dysfunction, unspecified: Secondary | ICD-10-CM | POA: Diagnosis not present

## 2022-08-18 DIAGNOSIS — C771 Secondary and unspecified malignant neoplasm of intrathoracic lymph nodes: Secondary | ICD-10-CM | POA: Insufficient documentation

## 2022-08-18 DIAGNOSIS — Z86718 Personal history of other venous thrombosis and embolism: Secondary | ICD-10-CM | POA: Insufficient documentation

## 2022-08-18 DIAGNOSIS — Z5112 Encounter for antineoplastic immunotherapy: Secondary | ICD-10-CM | POA: Diagnosis not present

## 2022-08-18 DIAGNOSIS — D649 Anemia, unspecified: Secondary | ICD-10-CM | POA: Insufficient documentation

## 2022-08-18 DIAGNOSIS — Z95828 Presence of other vascular implants and grafts: Secondary | ICD-10-CM

## 2022-08-18 DIAGNOSIS — Z87891 Personal history of nicotine dependence: Secondary | ICD-10-CM | POA: Diagnosis not present

## 2022-08-18 DIAGNOSIS — D509 Iron deficiency anemia, unspecified: Secondary | ICD-10-CM | POA: Diagnosis not present

## 2022-08-18 LAB — CBC WITH DIFFERENTIAL (CANCER CENTER ONLY)
Abs Immature Granulocytes: 0.01 10*3/uL (ref 0.00–0.07)
Basophils Absolute: 0 10*3/uL (ref 0.0–0.1)
Basophils Relative: 1 %
Eosinophils Absolute: 0 10*3/uL (ref 0.0–0.5)
Eosinophils Relative: 1 %
HCT: 37.7 % — ABNORMAL LOW (ref 39.0–52.0)
Hemoglobin: 12.2 g/dL — ABNORMAL LOW (ref 13.0–17.0)
Immature Granulocytes: 0 %
Lymphocytes Relative: 26 %
Lymphs Abs: 0.9 10*3/uL (ref 0.7–4.0)
MCH: 27.2 pg (ref 26.0–34.0)
MCHC: 32.4 g/dL (ref 30.0–36.0)
MCV: 84.2 fL (ref 80.0–100.0)
Monocytes Absolute: 0.6 10*3/uL (ref 0.1–1.0)
Monocytes Relative: 15 %
Neutro Abs: 2.1 10*3/uL (ref 1.7–7.7)
Neutrophils Relative %: 57 %
Platelet Count: 295 10*3/uL (ref 150–400)
RBC: 4.48 MIL/uL (ref 4.22–5.81)
RDW: 18.9 % — ABNORMAL HIGH (ref 11.5–15.5)
WBC Count: 3.6 10*3/uL — ABNORMAL LOW (ref 4.0–10.5)
nRBC: 0 % (ref 0.0–0.2)

## 2022-08-18 LAB — CMP (CANCER CENTER ONLY)
ALT: 10 U/L (ref 0–44)
AST: 13 U/L — ABNORMAL LOW (ref 15–41)
Albumin: 4.2 g/dL (ref 3.5–5.0)
Alkaline Phosphatase: 89 U/L (ref 38–126)
Anion gap: 3 — ABNORMAL LOW (ref 5–15)
BUN: 7 mg/dL (ref 6–20)
CO2: 30 mmol/L (ref 22–32)
Calcium: 9.8 mg/dL (ref 8.9–10.3)
Chloride: 105 mmol/L (ref 98–111)
Creatinine: 0.83 mg/dL (ref 0.61–1.24)
GFR, Estimated: >60 mL/min (ref >60)
Glucose, Bld: 79 mg/dL (ref 70–99)
Potassium: 4.4 mmol/L (ref 3.5–5.1)
Sodium: 138 mmol/L (ref 135–145)
Total Bilirubin: 0.3 mg/dL (ref 0.3–1.2)
Total Protein: 6.9 g/dL (ref 6.5–8.1)

## 2022-08-18 LAB — TSH: TSH: 2.449 u[IU]/mL (ref 0.350–4.500)

## 2022-08-18 MED ORDER — DIPHENHYDRAMINE HCL 25 MG PO CAPS
25.0000 mg | ORAL_CAPSULE | Freq: Once | ORAL | Status: AC
  Administered 2022-08-18: 25 mg via ORAL
  Filled 2022-08-18: qty 1

## 2022-08-18 MED ORDER — SODIUM CHLORIDE 0.9% FLUSH
10.0000 mL | INTRAVENOUS | Status: DC | PRN
  Administered 2022-08-18: 10 mL

## 2022-08-18 MED ORDER — SODIUM CHLORIDE 0.9% FLUSH
10.0000 mL | INTRAVENOUS | Status: DC | PRN
  Administered 2022-08-18 (×2): 10 mL

## 2022-08-18 MED ORDER — FAMOTIDINE 20 MG PO TABS
20.0000 mg | ORAL_TABLET | Freq: Once | ORAL | Status: AC
  Administered 2022-08-18: 20 mg via ORAL
  Filled 2022-08-18: qty 1

## 2022-08-18 MED ORDER — SODIUM CHLORIDE 0.9 % IV SOLN
1200.0000 mg | Freq: Once | INTRAVENOUS | Status: AC
  Administered 2022-08-18: 1200 mg via INTRAVENOUS
  Filled 2022-08-18: qty 20

## 2022-08-18 MED ORDER — SODIUM CHLORIDE 0.9 % IV SOLN: Freq: Once | INTRAVENOUS | Status: AC

## 2022-08-18 MED ORDER — ACETAMINOPHEN 325 MG PO TABS
650.0000 mg | ORAL_TABLET | Freq: Once | ORAL | Status: AC
  Administered 2022-08-18: 650 mg via ORAL
  Filled 2022-08-18: qty 2

## 2022-08-18 MED ORDER — HEPARIN SOD (PORK) LOCK FLUSH 100 UNIT/ML IV SOLN
500.0000 [IU] | Freq: Once | INTRAVENOUS | Status: AC | PRN
  Administered 2022-08-18: 500 [IU]

## 2022-08-18 NOTE — Patient Instructions (Signed)
[facility]  Discharge Instructions: Thank you for choosing [facility] to provide your oncology and hematology care.   If you have a lab appointment with the Cancer Center, please go directly to the Cancer Center and check in at the registration area.   Wear comfortable clothing and clothing appropriate for easy access to any Portacath or PICC line.   We strive to give you quality time with your provider. You may need to reschedule your appointment if you arrive late (15 or more minutes).  Arriving late affects you and other patients whose appointments are after yours.  Also, if you miss three or more appointments without notifying the office, you may be dismissed from the clinic at the provider's discretion.      For prescription refill requests, have your pharmacy contact our office and allow 72 hours for refills to be completed.    Today you received the following chemotherapy and/or immunotherapy agents Tecentriq      To help prevent nausea and vomiting after your treatment, we encourage you to take your nausea medication as directed.  BELOW ARE SYMPTOMS THAT SHOULD BE REPORTED IMMEDIATELY: *FEVER GREATER THAN 100.4 F (38 C) OR HIGHER *CHILLS OR SWEATING *NAUSEA AND VOMITING THAT IS NOT CONTROLLED WITH YOUR NAUSEA MEDICATION *UNUSUAL SHORTNESS OF BREATH *UNUSUAL BRUISING OR BLEEDING *URINARY PROBLEMS (pain or burning when urinating, or frequent urination) *BOWEL PROBLEMS (unusual diarrhea, constipation, pain near the anus) TENDERNESS IN MOUTH AND THROAT WITH OR WITHOUT PRESENCE OF ULCERS (sore throat, sores in mouth, or a toothache) UNUSUAL RASH, SWELLING OR PAIN  UNUSUAL VAGINAL DISCHARGE OR ITCHING   Items with * indicate a potential emergency and should be followed up as soon as possible or go to the Emergency Department if any problems should occur.  Please show the CHEMOTHERAPY ALERT CARD or IMMUNOTHERAPY ALERT CARD at  check-in to the Emergency Department and triage nurse.  Should you have questions after your visit or need to cancel or reschedule your appointment, please contact [facility]  Dept: [phone]  and follow the prompts.  Office hours are 8:00 a.m. to 4:30 p.m. Monday - Friday. Please note that voicemails left after 4:00 p.m. may not be returned until the following business day.  We are closed weekends and major holidays. You have access to a nurse at all times for urgent questions. Please call the main number to the clinic Dept: [phone] and follow the prompts.   For any non-urgent questions, you may also contact your provider using MyChart. We now offer e-Visits for anyone 18 and older to request care online for non-urgent symptoms. For details visit [email or web address].   Also download the MyChart app! Go to the app store, search "MyChart", open the app, select , and log in with your MyChart username and password.  Iron Sucrose Injection What is this medication? IRON SUCROSE (EYE ern SOO krose) treats low levels of iron (iron deficiency anemia) in people with kidney disease. Iron is a mineral that plays an important role in making red blood cells, which carry oxygen from your lungs to the rest of your body. This medicine may be used for other purposes; ask your health care provider or pharmacist if you have questions. COMMON BRAND NAME(S): Venofer What should I tell my care team before I take this medication? They need to know if you have any of these conditions: Anemia not caused by low iron levels Heart   disease High levels of iron in the blood Kidney disease Liver disease An unusual or allergic reaction to iron, other medications, foods, dyes, or preservatives Pregnant or trying to get pregnant Breastfeeding How should I use this medication? This medication is for infusion into a vein. It is given in a hospital or clinic  setting. Talk to your care team about the use of this medication in children. While this medication may be prescribed for children as young as 2 years for selected conditions, precautions do apply. Overdosage: If you think you have taken too much of this medicine contact a poison control center or emergency room at once. NOTE: This medicine is only for you. Do not share this medicine with others. What if I miss a dose? Keep appointments for follow-up doses. It is important not to miss your dose. Call your care team if you are unable to keep an appointment. What may interact with this medication? Do not take this medication with any of the following: Deferoxamine Dimercaprol Other iron products This medication may also interact with the following: Chloramphenicol Deferasirox This list may not describe all possible interactions. Give your health care provider a list of all the medicines, herbs, non-prescription drugs, or dietary supplements you use. Also tell them if you smoke, drink alcohol, or use illegal drugs. Some items may interact with your medicine. What should I watch for while using this medication? Visit your care team regularly. Tell your care team if your symptoms do not start to get better or if they get worse. You may need blood work done while you are taking this medication. You may need to follow a special diet. Talk to your care team. Foods that contain iron include: whole grains/cereals, dried fruits, beans, or peas, leafy green vegetables, and organ meats (liver, kidney). What side effects may I notice from receiving this medication? Side effects that you should report to your care team as soon as possible: Allergic reactions--skin rash, itching, hives, swelling of the face, lips, tongue, or throat Low blood pressure--dizziness, feeling faint or lightheaded, blurry vision Shortness of breath Side effects that usually do not require medical attention (report to your care team  if they continue or are bothersome): Flushing Headache Joint pain Muscle pain Nausea Pain, redness, or irritation at injection site This list may not describe all possible side effects. Call your doctor for medical advice about side effects. You may report side effects to FDA at 1-800-FDA-1088. Where should I keep my medication? This medication is given in a hospital or clinic and will not be stored at home. NOTE: This sheet is a summary. It may not cover all possible information. If you have questions about this medicine, talk to your doctor, pharmacist, or health care provider.  2023 Elsevier/Gold Standard (2020-08-07 00:00:00)  

## 2022-08-18 NOTE — Progress Notes (Signed)
HEMATOLOGY/ONCOLOGY CLINIC NOTE  Date of Service: 08/18/22   Patient Care Team: Ellyn HackShah, Syed Asad A, MD as PCP - General (Family Medicine)   CHIEF COMPLAINTS/PURPOSE OF CONSULTATION:  Follow-up for continued evaluation and management of metastatic lung adenocarcinoma  HISTORY OF PRESENTING ILLNESS:  Please see previous notes for details on initial presentation.  Current Treatment:  Atezolizumab maintenance   INTERVAL HISTORY:  David CashWillie Lee Hefter Jr. Is a 57 y.o. male here for continued evaluation and management of his metastatic lung adenocarcinoma.   He was doing well overall at his last visit with me on 07/07/22 and continued to be on maintenance atezolizumab.  Today, he states that he is doing well overall. He denies any issues with his port and is tolerating Atezolizumab well without any notable toxicities.  He is still having occasional, brief dizziness lasting for a few seconds when standing or turning around quickly. He reports needing his left ear cleaned out. He reports that he has bright red blood with wiping after passing a bowel movement every x2 days. He is using tuck's wipes with relief of his hemorrhoid discomfort.  He is still taking Eliquis 5mg  BID.  He still has tingling in his hands and feet without any recent changes. He denies any shortness of breath. He occasionally has phlegm buildup causing him to need to clear his throat. He was recently able to walk for an hour without much difficulty- he was able to pace himself and continue his walk without needing to rest. He is using marijuana which helps significantly with his appetite.  He states that he only has acid reflux if he lies down too soon after eating. He denies any difficulty swallowing. EGD in 07/2019 showed significant esophagitis.  He denies any N/V/D or abdominal pain.   MEDICAL HISTORY:  Past Medical History:  Diagnosis Date   Anxiety    Bipolar disorder (HCC)    Blood in stool    Cancer  (HCC)    Chronic bronchitis with emphysema (HCC)    pt denies this   GERD (gastroesophageal reflux disease)    Lung cancer (HCC) dx'd 01/2019   Peripheral vascular disease (HCC)    blood clot in neck Sept 12, 2020    SURGICAL HISTORY: Past Surgical History:  Procedure Laterality Date   APPENDECTOMY     teenager   INGUINAL HERNIA REPAIR Right 2016, 2017   [facility], 2018 [facility]-removed mesh   IR IMAGING GUIDED PORT INSERTION  04/26/2019   TOOTH EXTRACTION N/A 03/14/2020   Procedure: DENTAL RESTORATION/EXTRACTIONS;  Surgeon: Ocie DoyneJensen, Scott, DDS;  Location: [facility] OR;  Service: Oral Surgery;  Laterality: N/A;    SOCIAL HISTORY: Social History   Socioeconomic History   Marital status: Single    Spouse name: Not on file   Number of children: 1   Years of education: GED   Highest education level: Some college, no degree  Occupational History    Comment: fork lift driver  Tobacco Use   Smoking status: Former    Types: Cigars    Quit date: 01/20/2019    Years since quitting: 3.5   Smokeless tobacco: Never   Tobacco comments:    smokes 3 black and mild cigars per day  Vaping Use   Vaping Use: Never used  Substance and Sexual Activity   Alcohol use: Not Currently   Drug use: No   Sexual activity: Yes  Other Topics Concern   Not on file  Social History Narrative  Lives alone   Caffeine- coffee, 20 oz daily, tea occas   Social Determinants of Health   Financial Resource Strain: Low Risk  (09/29/2020)   Overall Financial Resource Strain (CARDIA)    Difficulty of Paying Living Expenses: Not very hard  Food Insecurity: No Food Insecurity (09/29/2020)   Hunger Vital Sign    Worried About Running Out of Food in the Last Year: Never true    Ran Out of Food in the Last Year: Never true  Transportation Needs: No Transportation Needs (09/29/2020)   PRAPARE - Administrator, Civil Service (Medical): No    Lack of Transportation (Non-Medical): No  Physical  Activity: Sufficiently Active (09/29/2020)   Exercise Vital Sign    Days of Exercise per Week: 3 days    Minutes of Exercise per Session: 150+ min  Stress: No Stress Concern Present (09/29/2020)   [employer] of Occupational Health - Occupational Stress Questionnaire    Feeling of Stress : Not at all  Social Connections: Socially Isolated (09/29/2020)   Social Connection and Isolation Panel [NHANES]    Frequency of Communication with Friends and Family: More than three times a week    Frequency of Social Gatherings with Friends and Family: Once a week    Attends Religious Services: Never    Database administrator or Organizations: No    Attends Engineer, structural: Not on file    Marital Status: Never married  Catering manager Violence: Not on file    FAMILY HISTORY: Family History  Problem Relation Age of Onset   Prostate cancer Father    Colon cancer Neg Hx    Stomach cancer Neg Hx    Esophageal cancer Neg Hx     ALLERGIES:  is allergic to atorvastatin calcium.  MEDICATIONS:  Current Outpatient Medications  Medication Sig Dispense Refill   Cyanocobalamin (VITAMIN B 12 PO) Take 2,500 mcg by mouth daily.     ELIQUIS 5 MG TABS tablet TAKE 1 TABLET(5 MG) BY MOUTH TWICE DAILY 60 tablet 11   esomeprazole (NEXIUM) 40 MG capsule TAKE 1 CAPSULE(40 MG) BY MOUTH DAILY 90 capsule 2   hydrocortisone (ANUSOL-HC) 2.5 % rectal cream Use twice a day for 7 days 30 g 1   lactose free nutrition (BOOST PLUS) LIQD Drink one carton (237 ml) Boost Plus/equivalent three times daily in between meals  0   lidocaine-prilocaine (EMLA) cream Apply 1 application. topically as needed. 30 g 0   LIVALO 1 MG TABS Take 1 tablet by mouth at bedtime.     LORazepam (ATIVAN) 0.5 MG tablet Take 1 tablet (0.5 mg total) by mouth every 8 (eight) hours. 30 tablet 0   propranolol (INDERAL) 20 MG tablet TAKE 1 TABLET(20 MG) BY MOUTH TWICE DAILY 60 tablet 5   rosuvastatin (CRESTOR) 10 MG tablet Take 10 mg by  mouth at bedtime.     sildenafil (VIAGRA) 50 MG tablet Take 1 tablet (50 mg total) by mouth daily as needed for erectile dysfunction. Take 30 mins to 4 hours prior to sexual activity 30 tablet 1   No current facility-administered medications for this visit.   REVIEW OF SYSTEMS:   10 Point review of Systems was done is negative except as noted above.   PHYSICAL EXAMINATION: ECOG FS:1 - Symptomatic but completely ambulatory .BP (!) 103/52   Pulse 66   Temp 97.7 F (36.5 C)   Resp 20   Wt 136 lb 4.8 oz (61.8 kg)   SpO2  100%   BMI 19.01 kg/m    Wt Readings from Last 3 Encounters:  08/18/22 136 lb 4.8 oz (61.8 kg)  07/28/22 133 lb 13.1 oz (60.7 kg)  07/07/22 136 lb 6.4 oz (61.9 kg)   Body mass index is 19.01 kg/m.    GENERAL:alert, in no acute distress and comfortable SKIN: no acute rashes, no significant lesions EYES: conjunctiva are pink and non-injected, sclera anicteric OROPHARYNX: MMM, no exudates, no oropharyngeal erythema or ulceration NECK: supple, no JVD LYMPH:  no palpable lymphadenopathy in the cervical, axillary or inguinal regions LUNGS: clear to auscultation b/l with normal respiratory effort HEART: regular rate & rhythm ABDOMEN:  normoactive bowel sounds , non tender, not distended. Extremity: no pedal edema PSYCH: alert & oriented x 3 with fluent speech NEURO: no focal motor/sensory deficits  LABORATORY DATA:  I have reviewed the data as listed    Latest Ref Rng & Units 08/18/2022   12:24 PM 07/28/2022   11:53 AM 07/07/2022    1:12 PM  CBC  WBC 4.0 - 10.5 K/uL 3.6  3.7  4.7   Hemoglobin 13.0 - 17.0 g/dL 16.1  09.6  04.5   Hematocrit 39.0 - 52.0 % 37.7  39.4  38.8   Platelets 150 - 400 K/uL 295  239  358       Latest Ref Rng & Units 08/18/2022   12:24 PM 07/28/2022   11:53 AM 07/07/2022    1:12 PM  CMP  Glucose 70 - 99 mg/dL 79  96  84   BUN 6 - 20 mg/dL 7  6  8    Creatinine 0.61 - 1.24 mg/dL 4.09  8.11  9.14   Sodium 135 - 145 mmol/L 138  136  137    Potassium 3.5 - 5.1 mmol/L 4.4  4.2  4.1   Chloride 98 - 111 mmol/L 105  104  103   CO2 22 - 32 mmol/L 30  29  29    Calcium 8.9 - 10.3 mg/dL 9.8  9.6  9.1   Total Protein 6.5 - 8.1 g/dL 6.9  6.9  6.5   Total Bilirubin 0.3 - 1.2 mg/dL 0.3  0.3  0.3   Alkaline Phos 38 - 126 U/L 89  91  90   AST 15 - 41 U/L 13  12  11    ALT 0 - 44 U/L 10  9  9     Lab Results  Component Value Date   IRON 16 (L) 05/05/2022   TIBC 448 05/05/2022   FERRITIN 7 (L) 05/05/2022    01/22/2019 [facility]: Tumor Mutational Burden     01/22/2019 PD-L1 Immunohistochemistry Analysis    01/22/2019 Soft Tissue Needle Core Biopsy Surgical Pathology     RADIOGRAPHIC STUDIES: I have personally reviewed the radiological images as listed and agreed with the findings in the report. CT CHEST ABDOMEN PELVIS W CONTRAST  Result Date: 08/11/2022 CLINICAL DATA:  Metastatic lung disease. On immunotherapy. Non-small-cell lung cancer. * Tracking Code: BO * EXAM: CT CHEST, ABDOMEN, AND PELVIS WITH CONTRAST TECHNIQUE: Multidetector CT imaging of the chest, abdomen and pelvis was performed following the standard protocol during bolus administration of intravenous contrast. RADIATION DOSE REDUCTION: This exam was performed according to the departmental dose-optimization program which includes automated exposure control, adjustment of the mA and/or kV according to patient size and/or use of iterative reconstruction technique. CONTRAST:  OMNIPAQUE IOHEXOL 300 MG/ML  SOLN COMPARISON:  CT 02/25/2022 and older FINDINGS: CT CHEST FINDINGS Cardiovascular: Right upper  chest port is accessed. Small pericardial effusion. Heart is nonenlarged. Mild vascular calcifications. Mediastinum/Nodes: Patulous esophagus with diffuse wall thickening which is stable from previous. Please correlate for history. Preserved thyroid gland. No specific abnormal lymph node enlargement present in the axillary regions hilum. Only some small less than 1 cm  size nodes are seen in both hila, not pathologic by size criteria and are unchanged. In the mediastinum there is some prominent nodes identified. Example along left side of the mediastinum on series 2, image 25 today measuring 15 by 8 mm and previously 15 by 9 mm. Several other nodes are also similar including subcarinal. There is some confluence soft tissue along the right side of the mediastinum, paratracheal with extension up towards the brachiocephalic artery. These areas are also similar to previous. Lungs/Pleura: Once again there is bilateral perihilar and paramediastinal ill-defined soft tissue thickening with bronchiectasis and distortion most consistent with postradiation change and fibrosis. Underlying emphysematous lung changes are identified. There is bilateral apical pleural thickening and scarring, right-greater-than-left. Basilar pleural thickening as well with scarring and fibrotic change. No new dominant lung mass. No pleural effusion or pneumothorax. Musculoskeletal: No chest wall mass or suspicious bone lesions identified. CT ABDOMEN PELVIS FINDINGS Hepatobiliary: There is geographic focal fat deposition seen in the liver adjacent to the falciform ligament. No other space-occupying liver lesion. The portal vein is patent. Gallbladder is nondilated. Pancreas: Mild pancreatic atrophy. Stable mild pancreatic ductal dilatation. No obvious mass. Spleen: Splenic granulomas are identified. Adrenals/Urinary Tract: The left adrenal gland is preserved. There is stable right adrenal nodule measuring 15 x 8 mm on series 2, image 66. In retrospect on the prior this measured 16 by 12 mm. Left kidney is unremarkable. No enhancing mass or collecting system dilatation. There is a heterogeneous lesion laterally about the right kidney which previously measured 16 x 14 mm and today 16 x 14 mm on series 2, image 70. Again this lesion has some complex features. Normal course and caliber of the ureters down to the  bladder. Slight wall thickening of the urinary bladder. Stomach/Bowel: Moderate colonic stool. Gas is seen in nondilated loops of small and large bowel. There is some wall thickening suggested of the third portion of the duodenal and very proximal jejunum. Loop of proximal jejunum has a diameter as well approaching 3.5 cm. This is new from previous. Please correlate with any symptoms. No inflammatory changes, free air or free fluid. Otherwise attention on follow-up. The stomach is collapsed. Vascular/Lymphatic: Normal caliber aorta and IVC with scattered vascular calcifications. No discrete abnormal lymph node enlargement present in the abdomen and pelvis. Reproductive: Enlarged prostate with mass effect along the base of the bladder. Other: Mild anasarca.  No free air or free fluid Musculoskeletal: Mild degenerative changes identified along the spine and pelvis. Sclerotic bone lesions identified along the left iliac bone consistent with an old bone island. Unchanged from prior IMPRESSION: Stable appearance of presumed postradiation changes with fibrosis along both hila and margins of the mediastinum. No new developing mass lesion or fluid collection. There are several prominent lymph nodes in the mediastinum which are stable as well as confluent soft tissue surrounding the trachea. Stable ectasia and wall thickening of the esophagus. Please correlate for any symptoms. Stable complex right-sided 16 mm renal lesion. Again an aggressive lesion is possible. Recommend dedicated workup or continued surveillance. Stable right adrenal nodule. New wall thickening along the third portion of the duodenal and very proximal jejunum with mild bowel ectasia in this location  but no inflammatory changes or signs of obstruction. This is nonspecific. Please correlate with any particular symptoms attention on short follow-up Electronically Signed   By: Karen Kays M.D.   On: 08/11/2022 13:31     ASSESSMENT & PLAN:   This is a  57 year old male with   1. Metastatic Poorly differentiated lung adenocarcinoma Presented with Large neck mass, mediastinal mass, renal mass, and questionable bone lesions  no brain mets on MRI brain 01/22/2019 PD-L1 Immunohistochemistry Analysis which revealed "Tumor Proportion Score (TPS) 50%" 05/16/2019 PET/CT ([id number]) revealed "Radiation changes in the right hemithorax, as above. Improving mediastinal nodal metastases. Prior bulky right cervical metastases have resolved. No evidence of metastatic disease in the abdomen/pelvis." 07/19/2019 Esophagus Scan ([id number]) revealed "Mild stricture at the C6-7 level likely related to prior esophageal surgery. Barium tablet was slow to pass through this area but did pass through after 1 minutes. Hiatal hernia with moderate gastroesophageal reflux. Moderate stricture above the hiatal hernia likely due to reflux. Barium tablet did not pass this area. There are changes of esophagitis which are likely due to reflux and possibly radiation as well."  10/10/19 of  CT Chest W Contrast ([id number])- no evidence of lung cancer progression at this time. He did have a PET CT scan on 04/13/2021 which was reviewed on the previous visit by Karena Addison.  This showed some borderline FDG avid right paratracheal right hilar and azygoesophageal lymph nodes that were suspicious for possible nodal recurrence however the patient notes that he did have a bad upper respiratory tract infection around the time that he had his PET CT scan and therefore reactive adenopathy is a possibility as well.    2. 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 Symptomatic hemorrhoids-currently no significant bleeding but have caused some mild iron deficiency anemia.  5. Normocytic anemia -Likely due to underlying malignancy + Iron deficiency due to ongoing  bleeding from hemorrhoids   6. S/pThrombocytosis -- likely due to paraneoplastic effect of tumor and reactive due to inflammation and tissue inflammation from RT.-- now resolved.   7. Nicotine dependence -has quit since cancer diagnosis  8. Iron deficiency - likely due to blood loss from hemorrhoids  9.  Erectile dysfunction-having partial benefits with using Viagra.  PLAN:  -Discussed lab results from today. CBC showed WBC of 3.6K, HGB of 12.2, and platelets of 295K. - CT CAP showed no obvious progression of his cancer but some thickening of duodenum and jejunum. Discussed his CT CAP results from 08/11/22 with him today. -Patient has no lab or clinical evidence of lung cancer recurrence/progression at this time. -He has no notable toxicities from his atezolizumab immunotherapy and we shall continue the same for maintenance every 3 weeks. - provided reassurance that his dizziness is consistent with benign positional vertigo- he states that he needs to have his left ear cleaned out. - He has occasional bright red blood with wiping after BM every 2-3 days. He is getting hemorrhoidal relief with medicated wipes. - Will reduce Eliquis does to 2.5mg  BID today. -will recheck Iron labs with next treatment to determine need for additional IV iron replacement.  FOLLOW UP: Continue atezolizumab for integrated scheduling  The total time spent in the appointment was 20 minutes*.  All of the patient's questions were answered with apparent satisfaction. The patient knows to call the clinic with any  problems, questions or concerns.   [name] MD MS AAHIVMS [facility] Hematology/Oncology Physician [facility]  .*Total Encounter Time as defined by the Centers for Medicare and Medicaid Services includes, in addition to the face-to-face time of a patient visit (documented in the note above) non-face-to-face time: obtaining and reviewing outside history, ordering and reviewing medications,  tests or procedures, care coordination (communications with other health care professionals or caregivers) and documentation in the medical record.   I,Alexis Herring,acting as a Neurosurgeon for [name], MD.,have documented all relevant documentation on the behalf of [name], MD,as directed by  [name], MD while in the presence of [name], MD.  .I have reviewed the above documentation for accuracy and completeness, and I agree with the above. [name] MD

## 2022-08-19 ENCOUNTER — Telehealth: Payer: Self-pay | Admitting: Hematology

## 2022-08-19 ENCOUNTER — Encounter: Payer: Self-pay | Admitting: Hematology

## 2022-08-19 MED ORDER — APIXABAN 2.5 MG PO TABS: 2.5000 mg | ORAL_TABLET | Freq: Two times a day (BID) | ORAL | 5 refills | Status: DC

## 2022-08-20 ENCOUNTER — Other Ambulatory Visit: Payer: Self-pay

## 2022-08-21 ENCOUNTER — Other Ambulatory Visit: Payer: Self-pay | Admitting: Hematology

## 2022-08-24 ENCOUNTER — Other Ambulatory Visit: Payer: Self-pay

## 2022-08-25 ENCOUNTER — Other Ambulatory Visit: Payer: Self-pay

## 2022-08-29 ENCOUNTER — Other Ambulatory Visit: Payer: Self-pay

## 2022-08-31 ENCOUNTER — Encounter: Payer: Self-pay | Admitting: Hematology

## 2022-09-06 ENCOUNTER — Other Ambulatory Visit: Payer: Self-pay

## 2022-09-06 DIAGNOSIS — C3491 Malignant neoplasm of unspecified part of right bronchus or lung: Secondary | ICD-10-CM

## 2022-09-08 ENCOUNTER — Inpatient Hospital Stay: Payer: Medicare HMO

## 2022-09-08 ENCOUNTER — Inpatient Hospital Stay: Payer: Medicare HMO | Attending: Physician Assistant

## 2022-09-08 ENCOUNTER — Inpatient Hospital Stay (HOSPITAL_BASED_OUTPATIENT_CLINIC_OR_DEPARTMENT_OTHER): Payer: Medicare HMO | Admitting: Physician Assistant

## 2022-09-08 ENCOUNTER — Other Ambulatory Visit: Payer: Self-pay | Admitting: Hematology

## 2022-09-08 VITALS — BP 99/66 | HR 72 | Resp 17

## 2022-09-08 VITALS — BP 95/65 | HR 88 | Temp 98.7°F | Resp 16 | Ht 71.0 in | Wt 132.5 lb

## 2022-09-08 DIAGNOSIS — Z7189 Other specified counseling: Secondary | ICD-10-CM

## 2022-09-08 DIAGNOSIS — R110 Nausea: Secondary | ICD-10-CM | POA: Diagnosis not present

## 2022-09-08 DIAGNOSIS — C3491 Malignant neoplasm of unspecified part of right bronchus or lung: Secondary | ICD-10-CM | POA: Diagnosis not present

## 2022-09-08 DIAGNOSIS — F419 Anxiety disorder, unspecified: Secondary | ICD-10-CM | POA: Insufficient documentation

## 2022-09-08 DIAGNOSIS — D509 Iron deficiency anemia, unspecified: Secondary | ICD-10-CM | POA: Insufficient documentation

## 2022-09-08 DIAGNOSIS — Z85118 Personal history of other malignant neoplasm of bronchus and lung: Secondary | ICD-10-CM | POA: Insufficient documentation

## 2022-09-08 DIAGNOSIS — N529 Male erectile dysfunction, unspecified: Secondary | ICD-10-CM | POA: Insufficient documentation

## 2022-09-08 DIAGNOSIS — Z5112 Encounter for antineoplastic immunotherapy: Secondary | ICD-10-CM | POA: Insufficient documentation

## 2022-09-08 DIAGNOSIS — D500 Iron deficiency anemia secondary to blood loss (chronic): Secondary | ICD-10-CM

## 2022-09-08 DIAGNOSIS — Z95828 Presence of other vascular implants and grafts: Secondary | ICD-10-CM

## 2022-09-08 DIAGNOSIS — Z7901 Long term (current) use of anticoagulants: Secondary | ICD-10-CM | POA: Insufficient documentation

## 2022-09-08 DIAGNOSIS — Z87891 Personal history of nicotine dependence: Secondary | ICD-10-CM | POA: Diagnosis not present

## 2022-09-08 DIAGNOSIS — Z79899 Other long term (current) drug therapy: Secondary | ICD-10-CM | POA: Insufficient documentation

## 2022-09-08 DIAGNOSIS — C771 Secondary and unspecified malignant neoplasm of intrathoracic lymph nodes: Secondary | ICD-10-CM | POA: Insufficient documentation

## 2022-09-08 LAB — CMP (CANCER CENTER ONLY)
ALT: 15 U/L (ref 0–44)
AST: 25 U/L (ref 15–41)
Albumin: 4.4 g/dL (ref 3.5–5.0)
Alkaline Phosphatase: 99 U/L (ref 38–126)
Anion gap: 4 — ABNORMAL LOW (ref 5–15)
BUN: 9 mg/dL (ref 6–20)
CO2: 28 mmol/L (ref 22–32)
Calcium: 9.5 mg/dL (ref 8.9–10.3)
Chloride: 105 mmol/L (ref 98–111)
Creatinine: 0.77 mg/dL (ref 0.61–1.24)
GFR, Estimated: >60 mL/min (ref >60)
Glucose, Bld: 110 mg/dL — ABNORMAL HIGH (ref 70–99)
Potassium: 4.1 mmol/L (ref 3.5–5.1)
Sodium: 137 mmol/L (ref 135–145)
Total Bilirubin: 0.3 mg/dL (ref 0.3–1.2)
Total Protein: 7 g/dL (ref 6.5–8.1)

## 2022-09-08 LAB — CBC WITH DIFFERENTIAL (CANCER CENTER ONLY)
Abs Immature Granulocytes: 0.01 10*3/uL (ref 0.00–0.07)
Basophils Absolute: 0 10*3/uL (ref 0.0–0.1)
Basophils Relative: 1 %
Eosinophils Absolute: 0 10*3/uL (ref 0.0–0.5)
Eosinophils Relative: 1 %
HCT: 38.4 % — ABNORMAL LOW (ref 39.0–52.0)
Hemoglobin: 12.5 g/dL — ABNORMAL LOW (ref 13.0–17.0)
Immature Granulocytes: 0 %
Lymphocytes Relative: 22 %
Lymphs Abs: 0.9 10*3/uL (ref 0.7–4.0)
MCH: 27.7 pg (ref 26.0–34.0)
MCHC: 32.6 g/dL (ref 30.0–36.0)
MCV: 85.1 fL (ref 80.0–100.0)
Monocytes Absolute: 0.5 10*3/uL (ref 0.1–1.0)
Monocytes Relative: 12 %
Neutro Abs: 2.7 10*3/uL (ref 1.7–7.7)
Neutrophils Relative %: 64 %
Platelet Count: 285 10*3/uL (ref 150–400)
RBC: 4.51 MIL/uL (ref 4.22–5.81)
RDW: 17.3 % — ABNORMAL HIGH (ref 11.5–15.5)
WBC Count: 4.2 10*3/uL (ref 4.0–10.5)
nRBC: 0 % (ref 0.0–0.2)

## 2022-09-08 LAB — FERRITIN: Ferritin: 10 ng/mL — ABNORMAL LOW (ref 24–336)

## 2022-09-08 LAB — TSH: TSH: 0.713 u[IU]/mL (ref 0.350–4.500)

## 2022-09-08 MED ORDER — DIPHENHYDRAMINE HCL 25 MG PO CAPS
25.0000 mg | ORAL_CAPSULE | Freq: Once | ORAL | Status: AC
  Administered 2022-09-08: 25 mg via ORAL
  Filled 2022-09-08: qty 1

## 2022-09-08 MED ORDER — FAMOTIDINE 20 MG PO TABS
20.0000 mg | ORAL_TABLET | Freq: Once | ORAL | Status: AC
  Administered 2022-09-08: 20 mg via ORAL
  Filled 2022-09-08: qty 1

## 2022-09-08 MED ORDER — SODIUM CHLORIDE 0.9 % IV SOLN: Freq: Once | INTRAVENOUS | Status: AC

## 2022-09-08 MED ORDER — HEPARIN SOD (PORK) LOCK FLUSH 100 UNIT/ML IV SOLN
500.0000 [IU] | Freq: Once | INTRAVENOUS | Status: AC | PRN
  Administered 2022-09-08: 500 [IU]

## 2022-09-08 MED ORDER — SODIUM CHLORIDE 0.9 % IV SOLN
1200.0000 mg | Freq: Once | INTRAVENOUS | Status: AC
  Administered 2022-09-08: 1200 mg via INTRAVENOUS
  Filled 2022-09-08: qty 20

## 2022-09-08 MED ORDER — ACETAMINOPHEN 325 MG PO TABS
650.0000 mg | ORAL_TABLET | Freq: Once | ORAL | Status: AC
  Administered 2022-09-08: 650 mg via ORAL
  Filled 2022-09-08: qty 2

## 2022-09-08 MED ORDER — SODIUM CHLORIDE 0.9% FLUSH
10.0000 mL | INTRAVENOUS | Status: DC | PRN
  Administered 2022-09-08: 10 mL

## 2022-09-08 NOTE — Patient Instructions (Signed)
[facility]  Discharge Instructions: Thank you for choosing [facility] to provide your oncology and hematology care.   If you have a lab appointment with the Cancer Center, please go directly to the Cancer Center and check in at the registration area.   Wear comfortable clothing and clothing appropriate for easy access to any Portacath or PICC line.   We strive to give you quality time with your provider. You may need to reschedule your appointment if you arrive late (15 or more minutes).  Arriving late affects you and other patients whose appointments are after yours.  Also, if you miss three or more appointments without notifying the office, you may be dismissed from the clinic at the provider's discretion.      For prescription refill requests, have your pharmacy contact our office and allow 72 hours for refills to be completed.    Today you received the following chemotherapy and/or immunotherapy agents tecentriq      To help prevent nausea and vomiting after your treatment, we encourage you to take your nausea medication as directed.  BELOW ARE SYMPTOMS THAT SHOULD BE REPORTED IMMEDIATELY: *FEVER GREATER THAN 100.4 F (38 C) OR HIGHER *CHILLS OR SWEATING *NAUSEA AND VOMITING THAT IS NOT CONTROLLED WITH YOUR NAUSEA MEDICATION *UNUSUAL SHORTNESS OF BREATH *UNUSUAL BRUISING OR BLEEDING *URINARY PROBLEMS (pain or burning when urinating, or frequent urination) *BOWEL PROBLEMS (unusual diarrhea, constipation, pain near the anus) TENDERNESS IN MOUTH AND THROAT WITH OR WITHOUT PRESENCE OF ULCERS (sore throat, sores in mouth, or a toothache) UNUSUAL RASH, SWELLING OR PAIN  UNUSUAL VAGINAL DISCHARGE OR ITCHING   Items with * indicate a potential emergency and should be followed up as soon as possible or go to the Emergency Department if any problems should occur.  Please show the CHEMOTHERAPY ALERT CARD or IMMUNOTHERAPY ALERT CARD at  check-in to the Emergency Department and triage nurse.  Should you have questions after your visit or need to cancel or reschedule your appointment, please contact [facility]  Dept: [phone]  and follow the prompts.  Office hours are 8:00 a.m. to 4:30 p.m. Monday - Friday. Please note that voicemails left after 4:00 p.m. may not be returned until the following business day.  We are closed weekends and major holidays. You have access to a nurse at all times for urgent questions. Please call the main number to the clinic Dept: [phone] and follow the prompts.   For any non-urgent questions, you may also contact your provider using MyChart. We now offer e-Visits for anyone 18 and older to request care online for non-urgent symptoms. For details visit [email or web address].   Also download the MyChart app! Go to the app store, search "MyChart", open the app, select [facility], and log in with your MyChart username and password.  

## 2022-09-09 ENCOUNTER — Encounter: Payer: Self-pay | Admitting: Physician Assistant

## 2022-09-09 ENCOUNTER — Encounter: Payer: Self-pay | Admitting: Hematology

## 2022-09-09 NOTE — Progress Notes (Signed)
HEMATOLOGY/ONCOLOGY CLINIC NOTE  Date of Service: 09/09/22   Patient Care Team: Ellyn Hack, MD as PCP - General (Family Medicine)   CHIEF COMPLAINTS/PURPOSE OF CONSULTATION:  Follow-up for continued evaluation and management of metastatic lung adenocarcinoma  HISTORY OF PRESENTING ILLNESS:  Please see previous notes for details on initial presentation.  Current Treatment:  Atezolizumab maintenance   INTERVAL HISTORY:  David Berry. Is a 57 y.o. male here for continued evaluation and management of his metastatic lung adenocarcinoma.  He was last seen by Dr. [name] on 08/18/2022. He is unaccompanied for this visit.   David Berry reports he is tolerating Atezolizumab therapy without any toxicities. His energy levels are overall stable. His appetite is overall stable but he lost 4 lbs since the last visit. He reports ocassional episodes of nausea that improves with ativan. He denies any abdominal pain and bowel habits are stable without recurrent episodes of diarrhea or constipation.The hemorrhoid pain and bleeding has improved. He is using hemorrhoidal wipes consistently with each bowel movement. He denies fevers, chills, sweats, shortness of breath, chest pain or cough. He has no other complaints.   MEDICAL HISTORY:  Past Medical History:  Diagnosis Date   Anxiety    Bipolar disorder (HCC)    Blood in stool    Cancer (HCC)    Chronic bronchitis with emphysema (HCC)    pt denies this   GERD (gastroesophageal reflux disease)    Lung cancer (HCC) dx'd 01/2019   Peripheral vascular disease (HCC)    blood clot in neck Sept 12, 2020    SURGICAL HISTORY: Past Surgical History:  Procedure Laterality Date   APPENDECTOMY     teenager   INGUINAL HERNIA REPAIR Right 2016, 2017   [facility], 2018 [facility]-removed mesh   IR IMAGING GUIDED PORT INSERTION  04/26/2019   TOOTH EXTRACTION N/A 03/14/2020   Procedure: DENTAL RESTORATION/EXTRACTIONS;  Surgeon:  Ocie Doyne, DDS;  Location: [facility] OR;  Service: Oral Surgery;  Laterality: N/A;    SOCIAL HISTORY: Social History   Socioeconomic History   Marital status: Single    Spouse name: Not on file   Number of children: 1   Years of education: GED   Highest education level: Some college, no degree  Occupational History    Comment: fork lift driver  Tobacco Use   Smoking status: Former    Types: Cigars    Quit date: 01/20/2019    Years since quitting: 3.6   Smokeless tobacco: Never   Tobacco comments:    smokes 3 black and mild cigars per day  Vaping Use   Vaping Use: Never used  Substance and Sexual Activity   Alcohol use: Not Currently   Drug use: No   Sexual activity: Yes  Other Topics Concern   Not on file  Social History Narrative   Lives alone   Caffeine- coffee, 20 oz daily, tea occas   Social Determinants of Health   Financial Resource Strain: Low Risk  (09/29/2020)   Overall Financial Resource Strain (CARDIA)    Difficulty of Paying Living Expenses: Not very hard  Food Insecurity: No Food Insecurity (09/29/2020)   Hunger Vital Sign    Worried About Running Out of Food in the Last Year: Never true    Ran Out of Food in the Last Year: Never true  Transportation Needs: No Transportation Needs (09/29/2020)   PRAPARE - Administrator, Civil Service (Medical): No  Lack of Transportation (Non-Medical): No  Physical Activity: Sufficiently Active (09/29/2020)   Exercise Vital Sign    Days of Exercise per Week: 3 days    Minutes of Exercise per Session: 150+ min  Stress: No Stress Concern Present (09/29/2020)   [employer] of Occupational Health - Occupational Stress Questionnaire    Feeling of Stress : Not at all  Social Connections: Socially Isolated (09/29/2020)   Social Connection and Isolation Panel [NHANES]    Frequency of Communication with Friends and Family: More than three times a week    Frequency of Social Gatherings with Friends and Family:  Once a week    Attends Religious Services: Never    Database administrator or Organizations: No    Attends Engineer, structural: Not on file    Marital Status: Never married  Catering manager Violence: Not on file    FAMILY HISTORY: Family History  Problem Relation Age of Onset   Prostate cancer Father    Colon cancer Neg Hx    Stomach cancer Neg Hx    Esophageal cancer Neg Hx     ALLERGIES:  is allergic to atorvastatin calcium.  MEDICATIONS:  Current Outpatient Medications  Medication Sig Dispense Refill   apixaban (ELIQUIS) 2.5 MG TABS tablet Take 1 tablet (2.5 mg total) by mouth 2 (two) times daily. 60 tablet 5   Cyanocobalamin (VITAMIN B 12 PO) Take 2,500 mcg by mouth daily.     esomeprazole (NEXIUM) 40 MG capsule TAKE 1 CAPSULE(40 MG) BY MOUTH DAILY 90 capsule 2   lactose free nutrition (BOOST PLUS) LIQD Drink one carton (237 ml) Boost Plus/equivalent three times daily in between meals  0   lidocaine-prilocaine (EMLA) cream Apply 1 application. topically as needed. 30 g 0   LORazepam (ATIVAN) 0.5 MG tablet Take 1 tablet (0.5 mg total) by mouth every 8 (eight) hours. 30 tablet 0   propranolol (INDERAL) 20 MG tablet TAKE 1 TABLET(20 MG) BY MOUTH TWICE DAILY 60 tablet 5   sildenafil (VIAGRA) 50 MG tablet Take 1 tablet (50 mg total) by mouth daily as needed for erectile dysfunction. Take 30 mins to 4 hours prior to sexual activity 30 tablet 1   Starch (ANUSOL RE) Place rectally as needed. Using the pads     ELIQUIS 5 MG TABS tablet TAKE 1 TABLET(5 MG) BY MOUTH TWICE DAILY 60 tablet 11   hydrocortisone (ANUSOL-HC) 2.5 % rectal cream Use twice a day for 7 days (Patient not taking: Reported on 09/08/2022) 30 g 1   LIVALO 1 MG TABS Take 1 tablet by mouth at bedtime.     rosuvastatin (CRESTOR) 10 MG tablet Take 10 mg by mouth at bedtime.     No current facility-administered medications for this visit.   REVIEW OF SYSTEMS:   10 Point review of Systems was done is negative  except as noted above.  PHYSICAL EXAMINATION: ECOG FS:1 - Symptomatic but completely ambulatory  .BP 95/65 (BP Location: Left Arm, Patient Position: Sitting)   Pulse 88   Temp 98.7 F (37.1 C) (Oral)   Resp 16   Ht 5\' 11"  (1.803 m)   Wt 132 lb 8 oz (60.1 kg)   SpO2 97%   BMI 18.48 kg/m    Wt Readings from Last 3 Encounters:  09/08/22 132 lb 8 oz (60.1 kg)  08/18/22 136 lb 4.8 oz (61.8 kg)  07/28/22 133 lb 13.1 oz (60.7 kg)   Body mass index is 18.48 kg/m.  GENERAL:alert, in no acute distress and comfortable SKIN: no acute rashes, no significant lesions EYES: conjunctiva are pink and non-injected, sclera anicteric LYMPH:  no palpable lymphadenopathy in the cervical or cervical regions LUNGS: clear to auscultation b/l with normal respiratory effort HEART: regular rate & rhythm Extremity: no pedal edema PSYCH: alert & oriented x 3 with fluent speech NEURO: no focal motor/sensory deficits  LABORATORY DATA:  I have reviewed the data as listed    Latest Ref Rng & Units 09/08/2022    1:35 PM 08/18/2022   12:24 PM 07/28/2022   11:53 AM  CBC  WBC 4.0 - 10.5 K/uL 4.2  3.6  3.7   Hemoglobin 13.0 - 17.0 g/dL 16.1  09.6  04.5   Hematocrit 39.0 - 52.0 % 38.4  37.7  39.4   Platelets 150 - 400 K/uL 285  295  239       Latest Ref Rng & Units 09/08/2022    1:35 PM 08/18/2022   12:24 PM 07/28/2022   11:53 AM  CMP  Glucose 70 - 99 mg/dL 409  79  96   BUN 6 - 20 mg/dL 9  7  6    Creatinine 0.61 - 1.24 mg/dL 8.11  9.14  7.82   Sodium 135 - 145 mmol/L 137  138  136   Potassium 3.5 - 5.1 mmol/L 4.1  4.4  4.2   Chloride 98 - 111 mmol/L 105  105  104   CO2 22 - 32 mmol/L 28  30  29    Calcium 8.9 - 10.3 mg/dL 9.5  9.8  9.6   Total Protein 6.5 - 8.1 g/dL 7.0  6.9  6.9   Total Bilirubin 0.3 - 1.2 mg/dL 0.3  0.3  0.3   Alkaline Phos 38 - 126 U/L 99  89  91   AST 15 - 41 U/L 25  13  12    ALT 0 - 44 U/L 15  10  9     Lab Results  Component Value Date   IRON 16 (L) 05/05/2022   TIBC 448  05/05/2022   FERRITIN 10 (L) 09/08/2022    01/22/2019 [facility]: Tumor Mutational Burden     01/22/2019 PD-L1 Immunohistochemistry Analysis    01/22/2019 Soft Tissue Needle Core Biopsy Surgical Pathology     RADIOGRAPHIC STUDIES: I have personally reviewed the radiological images as listed and agreed with the findings in the report. CT CHEST ABDOMEN PELVIS W CONTRAST  Result Date: 08/11/2022 CLINICAL DATA:  Metastatic lung disease. On immunotherapy. Non-small-cell lung cancer. * Tracking Code: BO * EXAM: CT CHEST, ABDOMEN, AND PELVIS WITH CONTRAST TECHNIQUE: Multidetector CT imaging of the chest, abdomen and pelvis was performed following the standard protocol during bolus administration of intravenous contrast. RADIATION DOSE REDUCTION: This exam was performed according to the departmental dose-optimization program which includes automated exposure control, adjustment of the mA and/or kV according to patient size and/or use of iterative reconstruction technique. CONTRAST:  OMNIPAQUE IOHEXOL 300 MG/ML  SOLN COMPARISON:  CT 02/25/2022 and older FINDINGS: CT CHEST FINDINGS Cardiovascular: Right upper chest port is accessed. Small pericardial effusion. Heart is nonenlarged. Mild vascular calcifications. Mediastinum/Nodes: Patulous esophagus with diffuse wall thickening which is stable from previous. Please correlate for history. Preserved thyroid gland. No specific abnormal lymph node enlargement present in the axillary regions hilum. Only some small less than 1 cm size nodes are seen in both hila, not pathologic by size criteria and are unchanged. In the mediastinum there is some prominent nodes  identified. Example along left side of the mediastinum on series 2, image 25 today measuring 15 by 8 mm and previously 15 by 9 mm. Several other nodes are also similar including subcarinal. There is some confluence soft tissue along the right side of the mediastinum, paratracheal with extension up  towards the brachiocephalic artery. These areas are also similar to previous. Lungs/Pleura: Once again there is bilateral perihilar and paramediastinal ill-defined soft tissue thickening with bronchiectasis and distortion most consistent with postradiation change and fibrosis. Underlying emphysematous lung changes are identified. There is bilateral apical pleural thickening and scarring, right-greater-than-left. Basilar pleural thickening as well with scarring and fibrotic change. No new dominant lung mass. No pleural effusion or pneumothorax. Musculoskeletal: No chest wall mass or suspicious bone lesions identified. CT ABDOMEN PELVIS FINDINGS Hepatobiliary: There is geographic focal fat deposition seen in the liver adjacent to the falciform ligament. No other space-occupying liver lesion. The portal vein is patent. Gallbladder is nondilated. Pancreas: Mild pancreatic atrophy. Stable mild pancreatic ductal dilatation. No obvious mass. Spleen: Splenic granulomas are identified. Adrenals/Urinary Tract: The left adrenal gland is preserved. There is stable right adrenal nodule measuring 15 x 8 mm on series 2, image 66. In retrospect on the prior this measured 16 by 12 mm. Left kidney is unremarkable. No enhancing mass or collecting system dilatation. There is a heterogeneous lesion laterally about the right kidney which previously measured 16 x 14 mm and today 16 x 14 mm on series 2, image 70. Again this lesion has some complex features. Normal course and caliber of the ureters down to the bladder. Slight wall thickening of the urinary bladder. Stomach/Bowel: Moderate colonic stool. Gas is seen in nondilated loops of small and large bowel. There is some wall thickening suggested of the third portion of the duodenal and very proximal jejunum. Loop of proximal jejunum has a diameter as well approaching 3.5 cm. This is new from previous. Please correlate with any symptoms. No inflammatory changes, free air or free fluid.  Otherwise attention on follow-up. The stomach is collapsed. Vascular/Lymphatic: Normal caliber aorta and IVC with scattered vascular calcifications. No discrete abnormal lymph node enlargement present in the abdomen and pelvis. Reproductive: Enlarged prostate with mass effect along the base of the bladder. Other: Mild anasarca.  No free air or free fluid Musculoskeletal: Mild degenerative changes identified along the spine and pelvis. Sclerotic bone lesions identified along the left iliac bone consistent with an old bone island. Unchanged from prior IMPRESSION: Stable appearance of presumed postradiation changes with fibrosis along both hila and margins of the mediastinum. No new developing mass lesion or fluid collection. There are several prominent lymph nodes in the mediastinum which are stable as well as confluent soft tissue surrounding the trachea. Stable ectasia and wall thickening of the esophagus. Please correlate for any symptoms. Stable complex right-sided 16 mm renal lesion. Again an aggressive lesion is possible. Recommend dedicated workup or continued surveillance. Stable right adrenal nodule. New wall thickening along the third portion of the duodenal and very proximal jejunum with mild bowel ectasia in this location but no inflammatory changes or signs of obstruction. This is nonspecific. Please correlate with any particular symptoms attention on short follow-up Electronically Signed   By: Karen Kays M.D.   On: 08/11/2022 13:31     ASSESSMENT & PLAN:  David Berry. Is a 57 y.o. male who returns for a follow up for metastatic lung cancer.    1. Metastatic Poorly differentiated lung adenocarcinoma -Presented with Large  neck mass, mediastinal mass, renal mass, and questionable bone lesions -No brain mets on MRI brain -01/22/2019 PD-L1 Immunohistochemistry Analysis which revealed "Tumor Proportion Score (TPS) 50%" -Started Atezolizumab on 03/21/2019. Added Carbo/taxol with Cycle 2 on  04/10/2019. After 6 cycles, transitioned to maintenance Atezolizumab q 21 days.  -Most recent CT CAP from 08/11/2022 showed no obvious progression of his cancer but some thickening of duodenum and jejunum   2. h/o  Impending SVC syndrome: --s/p pallaitive RT  3.h/o Acute DVT- Right IJ, Right Innominate Vein, and likely also the SVC --Related to malignancy and tobacco use --On long term Eliquis but decreased to 2.5 mg BID due to report of BRB with wiping after BM secondary to hemorrhoids.  4. Symptomatic hemorrhoids --Currently no significant bleeding but have caused some mild iron deficiency anemia.  5. Normocytic anemia -Likely due to underlying malignancy + Iron deficiency due to ongoing bleeding from hemorrhoids   6.Thrombocytosis--resolved --Likely due to paraneoplastic effect of tumor and reactive due to inflammation and tissue inflammation from RT..   7. Nicotine dependence -Has quit since cancer diagnosis  8. Iron deficiency  -Likely due to blood loss from hemorrhoids -Last received IV venofer 300 mg x 3 doses from 06/09/2022-06/23/2022.  9.  Erectile dysfunction --Having partial benefits with using Viagra.  PLAN:  --Due to Atezolizumab treatment today.  --Labs from today were reviewed and adequate for treatment. WBC 4.2, Hgb 12.5, Plt 285, Creatinine and LFTs normal.  --Ferritin level is 10 so recommend IV venofer 300 mg x 2 doses.  --No other prohibitive toxicities identified so proceed with atezolizumab without any dose modifications  FOLLOW UP: Continue atezolizumab for integrated scheduling   All of the patient's questions were answered with apparent satisfaction. The patient knows to call the clinic with any problems, questions or concerns.  I have spent a total of 30 minutes minutes of face-to-face and non-face-to-face time, preparing to see the patient, performing a medically appropriate examination, counseling and educating the patient, ordering tests/procedures,  referring and communicating with other health care professionals, documenting clinical information in the electronic health record, and care coordination.   Georga Kaufmann PA-C Dept of Hematology and Oncology [facility] Phone: [phone]

## 2022-09-12 ENCOUNTER — Encounter: Payer: Self-pay | Admitting: Physician Assistant

## 2022-09-12 ENCOUNTER — Encounter: Payer: Self-pay | Admitting: Hematology

## 2022-09-15 ENCOUNTER — Other Ambulatory Visit: Payer: Self-pay | Admitting: Hematology and Oncology

## 2022-09-16 ENCOUNTER — Inpatient Hospital Stay: Payer: Medicare HMO

## 2022-09-16 ENCOUNTER — Other Ambulatory Visit: Payer: Self-pay

## 2022-09-16 VITALS — BP 104/73 | HR 76 | Temp 98.3°F | Resp 17

## 2022-09-16 DIAGNOSIS — Z95828 Presence of other vascular implants and grafts: Secondary | ICD-10-CM

## 2022-09-16 DIAGNOSIS — Z5112 Encounter for antineoplastic immunotherapy: Secondary | ICD-10-CM | POA: Diagnosis not present

## 2022-09-16 MED ORDER — ACETAMINOPHEN 325 MG PO TABS
650.0000 mg | ORAL_TABLET | Freq: Once | ORAL | Status: AC
  Administered 2022-09-16: 650 mg via ORAL
  Filled 2022-09-16: qty 2

## 2022-09-16 MED ORDER — SODIUM CHLORIDE 0.9 % IV SOLN
300.0000 mg | Freq: Once | INTRAVENOUS | Status: AC
  Administered 2022-09-16: 300 mg via INTRAVENOUS
  Filled 2022-09-16: qty 300

## 2022-09-16 MED ORDER — LORATADINE 10 MG PO TABS
10.0000 mg | ORAL_TABLET | Freq: Once | ORAL | Status: AC
  Administered 2022-09-16: 10 mg via ORAL
  Filled 2022-09-16: qty 1

## 2022-09-16 MED ORDER — SODIUM CHLORIDE 0.9 % IV SOLN: Freq: Once | INTRAVENOUS | Status: AC

## 2022-09-23 ENCOUNTER — Inpatient Hospital Stay: Payer: Medicare HMO

## 2022-09-23 VITALS — BP 95/74 | HR 73 | Temp 98.7°F | Resp 16 | Wt 132.2 lb

## 2022-09-23 DIAGNOSIS — Z5112 Encounter for antineoplastic immunotherapy: Secondary | ICD-10-CM | POA: Diagnosis not present

## 2022-09-23 DIAGNOSIS — Z95828 Presence of other vascular implants and grafts: Secondary | ICD-10-CM

## 2022-09-23 MED ORDER — ACETAMINOPHEN 325 MG PO TABS
650.0000 mg | ORAL_TABLET | Freq: Once | ORAL | Status: AC
  Administered 2022-09-23: 650 mg via ORAL
  Filled 2022-09-23: qty 2

## 2022-09-23 MED ORDER — SODIUM CHLORIDE 0.9 % IV SOLN: Freq: Once | INTRAVENOUS | Status: AC

## 2022-09-23 MED ORDER — LORATADINE 10 MG PO TABS
10.0000 mg | ORAL_TABLET | Freq: Once | ORAL | Status: AC
  Administered 2022-09-23: 10 mg via ORAL
  Filled 2022-09-23: qty 1

## 2022-09-23 MED ORDER — SODIUM CHLORIDE 0.9 % IV SOLN
300.0000 mg | Freq: Once | INTRAVENOUS | Status: AC
  Administered 2022-09-23: 300 mg via INTRAVENOUS
  Filled 2022-09-23: qty 300

## 2022-09-23 NOTE — Patient Instructions (Signed)

## 2022-09-25 ENCOUNTER — Other Ambulatory Visit: Payer: Self-pay

## 2022-09-29 ENCOUNTER — Inpatient Hospital Stay (HOSPITAL_BASED_OUTPATIENT_CLINIC_OR_DEPARTMENT_OTHER): Payer: Medicare HMO | Admitting: Hematology

## 2022-09-29 ENCOUNTER — Inpatient Hospital Stay: Payer: Medicare HMO

## 2022-09-29 VITALS — BP 106/63 | HR 83 | Temp 97.7°F | Resp 20 | Wt 132.3 lb

## 2022-09-29 VITALS — BP 101/75 | HR 77 | Resp 19

## 2022-09-29 DIAGNOSIS — Z7189 Other specified counseling: Secondary | ICD-10-CM | POA: Diagnosis not present

## 2022-09-29 DIAGNOSIS — C3491 Malignant neoplasm of unspecified part of right bronchus or lung: Secondary | ICD-10-CM | POA: Diagnosis not present

## 2022-09-29 DIAGNOSIS — Z95828 Presence of other vascular implants and grafts: Secondary | ICD-10-CM

## 2022-09-29 DIAGNOSIS — Z5112 Encounter for antineoplastic immunotherapy: Secondary | ICD-10-CM | POA: Diagnosis not present

## 2022-09-29 LAB — CMP (CANCER CENTER ONLY)
ALT: 13 U/L (ref 0–44)
AST: 17 U/L (ref 15–41)
Albumin: 4.3 g/dL (ref 3.5–5.0)
Alkaline Phosphatase: 89 U/L (ref 38–126)
Anion gap: 5 (ref 5–15)
BUN: 6 mg/dL (ref 6–20)
CO2: 28 mmol/L (ref 22–32)
Calcium: 9.3 mg/dL (ref 8.9–10.3)
Chloride: 104 mmol/L (ref 98–111)
Creatinine: 0.75 mg/dL (ref 0.61–1.24)
GFR, Estimated: >60 mL/min (ref >60)
Glucose, Bld: 84 mg/dL (ref 70–99)
Potassium: 3.9 mmol/L (ref 3.5–5.1)
Sodium: 137 mmol/L (ref 135–145)
Total Bilirubin: 0.3 mg/dL (ref 0.3–1.2)
Total Protein: 6.8 g/dL (ref 6.5–8.1)

## 2022-09-29 LAB — CBC WITH DIFFERENTIAL (CANCER CENTER ONLY)
Abs Immature Granulocytes: 0.01 10*3/uL (ref 0.00–0.07)
Basophils Absolute: 0 10*3/uL (ref 0.0–0.1)
Basophils Relative: 1 %
Eosinophils Absolute: 0 10*3/uL (ref 0.0–0.5)
Eosinophils Relative: 1 %
HCT: 38.5 % — ABNORMAL LOW (ref 39.0–52.0)
Hemoglobin: 12.4 g/dL — ABNORMAL LOW (ref 13.0–17.0)
Immature Granulocytes: 0 %
Lymphocytes Relative: 26 %
Lymphs Abs: 1.1 10*3/uL (ref 0.7–4.0)
MCH: 27.7 pg (ref 26.0–34.0)
MCHC: 32.2 g/dL (ref 30.0–36.0)
MCV: 85.9 fL (ref 80.0–100.0)
Monocytes Absolute: 0.6 10*3/uL (ref 0.1–1.0)
Monocytes Relative: 15 %
Neutro Abs: 2.4 10*3/uL (ref 1.7–7.7)
Neutrophils Relative %: 57 %
Platelet Count: 293 10*3/uL (ref 150–400)
RBC: 4.48 MIL/uL (ref 4.22–5.81)
RDW: 16.5 % — ABNORMAL HIGH (ref 11.5–15.5)
WBC Count: 4.1 10*3/uL (ref 4.0–10.5)
nRBC: 0 % (ref 0.0–0.2)

## 2022-09-29 LAB — TSH: TSH: 1.651 u[IU]/mL (ref 0.350–4.500)

## 2022-09-29 MED ORDER — HEPARIN SOD (PORK) LOCK FLUSH 100 UNIT/ML IV SOLN
500.0000 [IU] | Freq: Once | INTRAVENOUS | Status: AC | PRN
  Administered 2022-09-29: 500 [IU]

## 2022-09-29 MED ORDER — SODIUM CHLORIDE 0.9 % IV SOLN
1200.0000 mg | Freq: Once | INTRAVENOUS | Status: AC
  Administered 2022-09-29: 1200 mg via INTRAVENOUS
  Filled 2022-09-29: qty 20

## 2022-09-29 MED ORDER — SODIUM CHLORIDE 0.9% FLUSH
10.0000 mL | INTRAVENOUS | Status: DC | PRN
  Administered 2022-09-29: 10 mL

## 2022-09-29 MED ORDER — ACETAMINOPHEN 325 MG PO TABS
650.0000 mg | ORAL_TABLET | Freq: Once | ORAL | Status: AC
  Administered 2022-09-29: 650 mg via ORAL
  Filled 2022-09-29: qty 2

## 2022-09-29 MED ORDER — DIPHENHYDRAMINE HCL 25 MG PO CAPS
25.0000 mg | ORAL_CAPSULE | Freq: Once | ORAL | Status: AC
  Administered 2022-09-29: 25 mg via ORAL
  Filled 2022-09-29: qty 1

## 2022-09-29 MED ORDER — SODIUM CHLORIDE 0.9 % IV SOLN: Freq: Once | INTRAVENOUS | Status: AC

## 2022-09-29 MED ORDER — FAMOTIDINE 20 MG PO TABS
20.0000 mg | ORAL_TABLET | Freq: Once | ORAL | Status: AC
  Administered 2022-09-29: 20 mg via ORAL
  Filled 2022-09-29: qty 1

## 2022-09-29 NOTE — Patient Instructions (Signed)
[facility]  Discharge Instructions: Thank you for choosing [facility] to provide your oncology and hematology care.   If you have a lab appointment with the Cancer Center, please go directly to the Cancer Center and check in at the registration area.   Wear comfortable clothing and clothing appropriate for easy access to any Portacath or PICC line.   We strive to give you quality time with your provider. You may need to reschedule your appointment if you arrive late (15 or more minutes).  Arriving late affects you and other patients whose appointments are after yours.  Also, if you miss three or more appointments without notifying the office, you may be dismissed from the clinic at the provider's discretion.      For prescription refill requests, have your pharmacy contact our office and allow 72 hours for refills to be completed.    Today you received the following chemotherapy and/or immunotherapy agents: atezolizumab      To help prevent nausea and vomiting after your treatment, we encourage you to take your nausea medication as directed.  BELOW ARE SYMPTOMS THAT SHOULD BE REPORTED IMMEDIATELY: *FEVER GREATER THAN 100.4 F (38 C) OR HIGHER *CHILLS OR SWEATING *NAUSEA AND VOMITING THAT IS NOT CONTROLLED WITH YOUR NAUSEA MEDICATION *UNUSUAL SHORTNESS OF BREATH *UNUSUAL BRUISING OR BLEEDING *URINARY PROBLEMS (pain or burning when urinating, or frequent urination) *BOWEL PROBLEMS (unusual diarrhea, constipation, pain near the anus) TENDERNESS IN MOUTH AND THROAT WITH OR WITHOUT PRESENCE OF ULCERS (sore throat, sores in mouth, or a toothache) UNUSUAL RASH, SWELLING OR PAIN  UNUSUAL VAGINAL DISCHARGE OR ITCHING   Items with * indicate a potential emergency and should be followed up as soon as possible or go to the Emergency Department if any problems should occur.  Please show the CHEMOTHERAPY ALERT CARD or IMMUNOTHERAPY ALERT CARD at  check-in to the Emergency Department and triage nurse.  Should you have questions after your visit or need to cancel or reschedule your appointment, please contact [facility]  Dept: [phone]  and follow the prompts.  Office hours are 8:00 a.m. to 4:30 p.m. Monday - Friday. Please note that voicemails left after 4:00 p.m. may not be returned until the following business day.  We are closed weekends and major holidays. You have access to a nurse at all times for urgent questions. Please call the main number to the clinic Dept: [phone] and follow the prompts.   For any non-urgent questions, you may also contact your provider using MyChart. We now offer e-Visits for anyone 18 and older to request care online for non-urgent symptoms. For details visit [email or web address].   Also download the MyChart app! Go to the app store, search "MyChart", open the app, select [facility], and log in with your MyChart username and password.  

## 2022-09-29 NOTE — Progress Notes (Signed)
HEMATOLOGY/ONCOLOGY CLINIC NOTE  Date of Service: 09/29/22  Patient Care Team: Ellyn Hack, MD as PCP - General (Family Medicine)   CHIEF COMPLAINTS/PURPOSE OF CONSULTATION:  Follow-up for continued evaluation and management of metastatic lung adenocarcinoma  HISTORY OF PRESENTING ILLNESS:  Please see previous notes for details on initial presentation.  Current Treatment:  Atezolizumab maintenance   INTERVAL HISTORY:  David Berry. Is a 57 y.o. male here for continued evaluation and management of his metastatic lung adenocarcinoma. Patient was last seen by me on 08/18/2022 and complained of occasional, brief dizziness episodes, bright red blood with wiping. He also reported stable bilateral tingling in hands/feet and occasional phlegm buildup in his throat. Additionally, he reported acid reflux after lying down once consuming food.   Today, he reports that he has been feeling well overall. He denies any issues with his immunotherapy, such as diarrhea or nausea. He does endorse acid reflux sometimes in the mornings though this is improving. Patient endorses normal eating/sleeping habits and his weight has been stable.   He reports that cutting down blood thinners did improve his hemorrhoidal bleeding. He does complain of trembling when lifting objects and he does endorse stable lower back pain. He continues to have phlegm in his upper airway. He denies any swelling. Patient is needing a Propanolol refill.  MEDICAL HISTORY:  Past Medical History:  Diagnosis Date   Anxiety    Bipolar disorder (HCC)    Blood in stool    Cancer (HCC)    Chronic bronchitis with emphysema (HCC)    pt denies this   GERD (gastroesophageal reflux disease)    Lung cancer (HCC) dx'd 01/2019   Peripheral vascular disease (HCC)    blood clot in neck Sept 12, 2020    SURGICAL HISTORY: Past Surgical History:  Procedure Laterality Date   APPENDECTOMY     teenager   INGUINAL HERNIA  REPAIR Right 2016, 2017   [facility], 2018 [facility]-removed mesh   IR IMAGING GUIDED PORT INSERTION  04/26/2019   TOOTH EXTRACTION N/A 03/14/2020   Procedure: DENTAL RESTORATION/EXTRACTIONS;  Surgeon: Ocie Doyne, DDS;  Location: [facility] OR;  Service: Oral Surgery;  Laterality: N/A;    SOCIAL HISTORY: Social History   Socioeconomic History   Marital status: Single    Spouse name: Not on file   Number of children: 1   Years of education: GED   Highest education level: Some college, no degree  Occupational History    Comment: fork lift driver  Tobacco Use   Smoking status: Former    Types: Cigars    Quit date: 01/20/2019    Years since quitting: 3.6   Smokeless tobacco: Never   Tobacco comments:    smokes 3 black and mild cigars per day  Vaping Use   Vaping Use: Never used  Substance and Sexual Activity   Alcohol use: Not Currently   Drug use: No   Sexual activity: Yes  Other Topics Concern   Not on file  Social History Narrative   Lives alone   Caffeine- coffee, 20 oz daily, tea occas   Social Determinants of Health   Financial Resource Strain: Low Risk  (09/29/2020)   Overall Financial Resource Strain (CARDIA)    Difficulty of Paying Living Expenses: Not very hard  Food Insecurity: No Food Insecurity (09/29/2020)   Hunger Vital Sign    Worried About Running Out of Food in the Last Year: Never true  Ran Out of Food in the Last Year: Never true  Transportation Needs: No Transportation Needs (09/29/2020)   PRAPARE - Administrator, Civil Service (Medical): No    Lack of Transportation (Non-Medical): No  Physical Activity: Sufficiently Active (09/29/2020)   Exercise Vital Sign    Days of Exercise per Week: 3 days    Minutes of Exercise per Session: 150+ min  Stress: No Stress Concern Present (09/29/2020)   [employer] of Occupational Health - Occupational Stress Questionnaire    Feeling of Stress : Not at all  Social Connections: Socially Isolated  (09/29/2020)   Social Connection and Isolation Panel [NHANES]    Frequency of Communication with Friends and Family: More than three times a week    Frequency of Social Gatherings with Friends and Family: Once a week    Attends Religious Services: Never    Database administrator or Organizations: No    Attends Engineer, structural: Not on file    Marital Status: Never married  Catering manager Violence: Not on file    FAMILY HISTORY: Family History  Problem Relation Age of Onset   Prostate cancer Father    Colon cancer Neg Hx    Stomach cancer Neg Hx    Esophageal cancer Neg Hx     ALLERGIES:  is allergic to atorvastatin calcium.  MEDICATIONS:  Current Outpatient Medications  Medication Sig Dispense Refill   apixaban (ELIQUIS) 2.5 MG TABS tablet Take 1 tablet (2.5 mg total) by mouth 2 (two) times daily. 60 tablet 5   Cyanocobalamin (VITAMIN B 12 PO) Take 2,500 mcg by mouth daily.     ELIQUIS 5 MG TABS tablet TAKE 1 TABLET(5 MG) BY MOUTH TWICE DAILY 60 tablet 11   esomeprazole (NEXIUM) 40 MG capsule TAKE 1 CAPSULE(40 MG) BY MOUTH DAILY 90 capsule 2   hydrocortisone (ANUSOL-HC) 2.5 % rectal cream Use twice a day for 7 days (Patient not taking: Reported on 09/08/2022) 30 g 1   lactose free nutrition (BOOST PLUS) LIQD Drink one carton (237 ml) Boost Plus/equivalent three times daily in between meals  0   lidocaine-prilocaine (EMLA) cream Apply 1 application. topically as needed. 30 g 0   LIVALO 1 MG TABS Take 1 tablet by mouth at bedtime.     LORazepam (ATIVAN) 0.5 MG tablet Take 1 tablet (0.5 mg total) by mouth every 8 (eight) hours. 30 tablet 0   propranolol (INDERAL) 20 MG tablet TAKE 1 TABLET(20 MG) BY MOUTH TWICE DAILY 60 tablet 5   rosuvastatin (CRESTOR) 10 MG tablet Take 10 mg by mouth at bedtime.     sildenafil (VIAGRA) 50 MG tablet Take 1 tablet (50 mg total) by mouth daily as needed for erectile dysfunction. Take 30 mins to 4 hours prior to sexual activity 30 tablet 1    Starch (ANUSOL RE) Place rectally as needed. Using the pads     No current facility-administered medications for this visit.   REVIEW OF SYSTEMS:    10 Point review of Systems was done is negative except as noted above.   PHYSICAL EXAMINATION: ECOG FS:1 - Symptomatic but completely ambulatory .BP 106/63   Pulse 83   Temp 97.7 F (36.5 C)   Resp 20   Wt 132 lb 4.8 oz (60 kg)   SpO2 98%   BMI 18.45 kg/m    Wt Readings from Last 3 Encounters:  09/29/22 132 lb 4.8 oz (60 kg)  09/23/22 132 lb 4 oz (60 kg)  09/08/22 132 lb 8 oz (60.1 kg)   Body mass index is 18.45 kg/m.     GENERAL:alert, in no acute distress and comfortable SKIN: no acute rashes, no significant lesions EYES: conjunctiva are pink and non-injected, sclera anicteric OROPHARYNX: MMM, no exudates, no oropharyngeal erythema or ulceration NECK: supple, no JVD LYMPH:  no palpable lymphadenopathy in the cervical, axillary or inguinal regions LUNGS: clear to auscultation b/l with normal respiratory effort HEART: regular rate & rhythm ABDOMEN:  normoactive bowel sounds , non tender, not distended. Extremity: no pedal edema PSYCH: alert & oriented x 3 with fluent speech NEURO: no focal motor/sensory deficits   LABORATORY DATA:  I have reviewed the data as listed    Latest Ref Rng & Units 09/29/2022    2:04 PM 09/08/2022    1:35 PM 08/18/2022   12:24 PM  CBC  WBC 4.0 - 10.5 K/uL 4.1  4.2  3.6   Hemoglobin 13.0 - 17.0 g/dL 16.1  09.6  04.5   Hematocrit 39.0 - 52.0 % 38.5  38.4  37.7   Platelets 150 - 400 K/uL 293  285  295       Latest Ref Rng & Units 09/29/2022    2:04 PM 09/08/2022    1:35 PM 08/18/2022   12:24 PM  CMP  Glucose 70 - 99 mg/dL 84  409  79   BUN 6 - 20 mg/dL 6  9  7    Creatinine 0.61 - 1.24 mg/dL 8.11  9.14  7.82   Sodium 135 - 145 mmol/L 137  137  138   Potassium 3.5 - 5.1 mmol/L 3.9  4.1  4.4   Chloride 98 - 111 mmol/L 104  105  105   CO2 22 - 32 mmol/L 28  28  30    Calcium 8.9 - 10.3  mg/dL 9.3  9.5  9.8   Total Protein 6.5 - 8.1 g/dL 6.8  7.0  6.9   Total Bilirubin 0.3 - 1.2 mg/dL 0.3  0.3  0.3   Alkaline Phos 38 - 126 U/L 89  99  89   AST 15 - 41 U/L 17  25  13    ALT 0 - 44 U/L 13  15  10     Lab Results  Component Value Date   IRON 16 (L) 05/05/2022   TIBC 448 05/05/2022   FERRITIN 10 (L) 09/08/2022    01/22/2019 [facility]: Tumor Mutational Burden     01/22/2019 PD-L1 Immunohistochemistry Analysis    01/22/2019 Soft Tissue Needle Core Biopsy Surgical Pathology     RADIOGRAPHIC STUDIES: I have personally reviewed the radiological images as listed and agreed with the findings in the report. No results found.   ASSESSMENT & PLAN:   This is a 57 year old male with   1. Metastatic Poorly differentiated lung adenocarcinoma Presented with Large neck mass, mediastinal mass, renal mass, and questionable bone lesions  no brain mets on MRI brain 01/22/2019 PD-L1 Immunohistochemistry Analysis which revealed "Tumor Proportion Score (TPS) 50%" 05/16/2019 PET/CT ([id number]) revealed "Radiation changes in the right hemithorax, as above. Improving mediastinal nodal metastases. Prior bulky right cervical metastases have resolved. No evidence of metastatic disease in the abdomen/pelvis." 07/19/2019 Esophagus Scan ([id number]) revealed "Mild stricture at the C6-7 level likely related to prior esophageal surgery. Barium tablet was slow to pass through this area but did pass through after 1 minutes. Hiatal hernia with moderate gastroesophageal reflux. Moderate stricture above the hiatal hernia likely due to reflux. Barium tablet did not pass  this area. There are changes of esophagitis which are likely due to reflux and possibly radiation as well."  10/10/19 of  CT Chest W Contrast ([id number])- no evidence of lung cancer progression at this time. He did have a PET CT scan on 04/13/2021 which was reviewed on the previous visit by Karena Addison.  This showed some borderline FDG  avid right paratracheal right hilar and azygoesophageal lymph nodes that were suspicious for possible nodal recurrence however the patient notes that he did have a bad upper respiratory tract infection around the time that he had his PET CT scan and therefore reactive adenopathy is a possibility as well.    2. 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 Symptomatic hemorrhoids-currently no significant bleeding but have caused some mild iron deficiency anemia.  5. Normocytic anemia -Likely due to underlying malignancy + Iron deficiency due to ongoing bleeding from hemorrhoids   6. S/pThrombocytosis -- likely due to paraneoplastic effect of tumor and reactive due to inflammation and tissue inflammation from RT.-- now resolved.   7. Nicotine dependence -has quit since cancer diagnosis  8. Iron deficiency - likely due to blood loss from hemorrhoids  9.  Erectile dysfunction-having partial benefits with using Viagra.  PLAN:   -Discussed lab results on 09/29/2022 in detail with patient. CBC showed WBC of 4.1K, hemoglobin improved to 12.4, and platelets of 293K. -CMP stable  -Patient has no lab or clinical evidence of lung cancer recurrence/progression at this time. -patient has no notable toxicities with immunotherapy at this time -continue Atezolizumab immunotherapy -continue Eliquis 2.5mg  BID today. -will refill Viagra 50MG  -continue to follow with neurologist for optimal care of his eseential tremors  FOLLOW UP: Per integrated scheduling  The total time spent in the appointment was 25 minutes* .  All of the patient's questions were answered with apparent satisfaction. The patient knows to call the clinic with any problems, questions or concerns.   [name] MD MS AAHIVMS [facility] Hematology/Oncology Physician [facility]  .*Total Encounter Time as defined by the Centers for Medicare and Medicaid Services includes, in addition to the face-to-face time of a patient visit (documented in the note above) non-face-to-face time: obtaining and reviewing outside history, ordering and reviewing medications, tests or procedures, care coordination (communications with other health care professionals or caregivers) and documentation in the medical record.    I,Mitra Faeizi,acting as a Neurosurgeon for [name], MD.,have documented all relevant documentation on the behalf of [name], MD,as directed by  [name], MD while in the presence of [name], MD.  .I have reviewed the above documentation for accuracy and completeness, and I agree with the above. [name] MD

## 2022-10-01 ENCOUNTER — Other Ambulatory Visit: Payer: Self-pay

## 2022-10-05 ENCOUNTER — Encounter: Payer: Self-pay | Admitting: Physician Assistant

## 2022-10-05 ENCOUNTER — Encounter: Payer: Self-pay | Admitting: Hematology

## 2022-10-05 MED ORDER — SILDENAFIL CITRATE 50 MG PO TABS
50.0000 mg | ORAL_TABLET | Freq: Every day | ORAL | 1 refills | Status: DC | PRN
Start: 2022-10-05 — End: 2023-06-08

## 2022-10-07 ENCOUNTER — Other Ambulatory Visit: Payer: Self-pay

## 2022-10-18 ENCOUNTER — Other Ambulatory Visit: Payer: Self-pay

## 2022-10-19 ENCOUNTER — Telehealth: Payer: Self-pay | Admitting: Internal Medicine

## 2022-10-19 NOTE — Telephone Encounter (Signed)
Spoke with patient briefly. "I'm on my way to my fishing spot, but I can talk to you." Acknowledges he is having rectal blood with his bowel movements. States, "I do not want to do that banding, but if she can come up with something else." Agrees to 11/04/22 at 3:40 arrive at 3:30 pm. (Call dropped twice)

## 2022-10-19 NOTE — Telephone Encounter (Signed)
Received MyChart message from patient stating he is still having bleeding issues when having bowel movements.  He wants to know what he can do to lessen the bleeding.  Please call patient and advise.

## 2022-10-20 ENCOUNTER — Other Ambulatory Visit: Payer: Self-pay

## 2022-10-20 ENCOUNTER — Inpatient Hospital Stay: Payer: Medicare HMO

## 2022-10-20 ENCOUNTER — Inpatient Hospital Stay (HOSPITAL_BASED_OUTPATIENT_CLINIC_OR_DEPARTMENT_OTHER): Payer: Medicare HMO | Admitting: Hematology

## 2022-10-20 ENCOUNTER — Inpatient Hospital Stay: Payer: Medicare HMO | Admitting: Hematology

## 2022-10-20 ENCOUNTER — Inpatient Hospital Stay: Payer: Medicare HMO | Attending: Physician Assistant

## 2022-10-20 DIAGNOSIS — Z5112 Encounter for antineoplastic immunotherapy: Secondary | ICD-10-CM | POA: Insufficient documentation

## 2022-10-20 DIAGNOSIS — Z86718 Personal history of other venous thrombosis and embolism: Secondary | ICD-10-CM | POA: Diagnosis not present

## 2022-10-20 DIAGNOSIS — Z7189 Other specified counseling: Secondary | ICD-10-CM

## 2022-10-20 DIAGNOSIS — D500 Iron deficiency anemia secondary to blood loss (chronic): Secondary | ICD-10-CM | POA: Insufficient documentation

## 2022-10-20 DIAGNOSIS — C3491 Malignant neoplasm of unspecified part of right bronchus or lung: Secondary | ICD-10-CM

## 2022-10-20 DIAGNOSIS — Z79899 Other long term (current) drug therapy: Secondary | ICD-10-CM | POA: Diagnosis not present

## 2022-10-20 DIAGNOSIS — N529 Male erectile dysfunction, unspecified: Secondary | ICD-10-CM | POA: Insufficient documentation

## 2022-10-20 DIAGNOSIS — Z87891 Personal history of nicotine dependence: Secondary | ICD-10-CM | POA: Insufficient documentation

## 2022-10-20 DIAGNOSIS — Z95828 Presence of other vascular implants and grafts: Secondary | ICD-10-CM

## 2022-10-20 DIAGNOSIS — C771 Secondary and unspecified malignant neoplasm of intrathoracic lymph nodes: Secondary | ICD-10-CM | POA: Insufficient documentation

## 2022-10-20 DIAGNOSIS — Z7901 Long term (current) use of anticoagulants: Secondary | ICD-10-CM | POA: Insufficient documentation

## 2022-10-20 LAB — CMP (CANCER CENTER ONLY)
ALT: 12 U/L (ref 0–44)
AST: 15 U/L (ref 15–41)
Albumin: 4 g/dL (ref 3.5–5.0)
Alkaline Phosphatase: 94 U/L (ref 38–126)
Anion gap: 6 (ref 5–15)
BUN: 7 mg/dL (ref 6–20)
CO2: 29 mmol/L (ref 22–32)
Calcium: 9.6 mg/dL (ref 8.9–10.3)
Chloride: 106 mmol/L (ref 98–111)
Creatinine: 0.65 mg/dL (ref 0.61–1.24)
GFR, Estimated: >60 mL/min (ref >60)
Glucose, Bld: 108 mg/dL — ABNORMAL HIGH (ref 70–99)
Potassium: 3.9 mmol/L (ref 3.5–5.1)
Sodium: 141 mmol/L (ref 135–145)
Total Bilirubin: 0.3 mg/dL (ref 0.3–1.2)
Total Protein: 7 g/dL (ref 6.5–8.1)

## 2022-10-20 LAB — CBC WITH DIFFERENTIAL (CANCER CENTER ONLY)
Abs Immature Granulocytes: 0 10*3/uL (ref 0.00–0.07)
Basophils Absolute: 0.1 10*3/uL (ref 0.0–0.1)
Basophils Relative: 1 %
Eosinophils Absolute: 0.1 10*3/uL (ref 0.0–0.5)
Eosinophils Relative: 1 %
HCT: 38.3 % — ABNORMAL LOW (ref 39.0–52.0)
Hemoglobin: 12.4 g/dL — ABNORMAL LOW (ref 13.0–17.0)
Immature Granulocytes: 0 %
Lymphocytes Relative: 19 %
Lymphs Abs: 0.8 10*3/uL (ref 0.7–4.0)
MCH: 28.6 pg (ref 26.0–34.0)
MCHC: 32.4 g/dL (ref 30.0–36.0)
MCV: 88.5 fL (ref 80.0–100.0)
Monocytes Absolute: 0.7 10*3/uL (ref 0.1–1.0)
Monocytes Relative: 16 %
Neutro Abs: 2.5 10*3/uL (ref 1.7–7.7)
Neutrophils Relative %: 63 %
Platelet Count: 303 10*3/uL (ref 150–400)
RBC: 4.33 MIL/uL (ref 4.22–5.81)
RDW: 15.7 % — ABNORMAL HIGH (ref 11.5–15.5)
WBC Count: 4.1 10*3/uL (ref 4.0–10.5)
nRBC: 0 % (ref 0.0–0.2)

## 2022-10-20 LAB — TSH: TSH: 1.176 u[IU]/mL (ref 0.350–4.500)

## 2022-10-20 LAB — FERRITIN: Ferritin: 52 ng/mL (ref 24–336)

## 2022-10-20 MED ORDER — SODIUM CHLORIDE 0.9 % IV SOLN: Freq: Once | INTRAVENOUS | Status: AC

## 2022-10-20 MED ORDER — SODIUM CHLORIDE 0.9 % IV SOLN
1200.0000 mg | Freq: Once | INTRAVENOUS | Status: AC
  Administered 2022-10-20: 1200 mg via INTRAVENOUS
  Filled 2022-10-20: qty 20

## 2022-10-20 MED ORDER — FAMOTIDINE 20 MG PO TABS
20.0000 mg | ORAL_TABLET | Freq: Once | ORAL | Status: AC
  Administered 2022-10-20: 20 mg via ORAL
  Filled 2022-10-20: qty 1

## 2022-10-20 MED ORDER — SODIUM CHLORIDE 0.9% FLUSH
10.0000 mL | INTRAVENOUS | Status: DC | PRN
  Administered 2022-10-20: 10 mL

## 2022-10-20 MED ORDER — DIPHENHYDRAMINE HCL 25 MG PO CAPS
25.0000 mg | ORAL_CAPSULE | Freq: Once | ORAL | Status: AC
  Administered 2022-10-20: 25 mg via ORAL
  Filled 2022-10-20: qty 1

## 2022-10-20 MED ORDER — HEPARIN SOD (PORK) LOCK FLUSH 100 UNIT/ML IV SOLN
500.0000 [IU] | Freq: Once | INTRAVENOUS | Status: AC | PRN
  Administered 2022-10-20: 500 [IU]

## 2022-10-20 MED ORDER — ACETAMINOPHEN 325 MG PO TABS
650.0000 mg | ORAL_TABLET | Freq: Once | ORAL | Status: AC
  Administered 2022-10-20: 650 mg via ORAL
  Filled 2022-10-20: qty 2

## 2022-10-20 NOTE — Progress Notes (Signed)
HEMATOLOGY/ONCOLOGY CLINIC NOTE  Date of Service: 10/20/22  Patient Care Team: Ellyn Hack, MD as PCP - General (Family Medicine)   CHIEF COMPLAINTS/PURPOSE OF CONSULTATION:  Follow-up for continued evaluation and management of metastatic lung adenocarcinoma  HISTORY OF PRESENTING ILLNESS:  Please see previous notes for details on initial presentation.  Current Treatment:  Atezolizumab maintenance   INTERVAL HISTORY:  David Berry. Is a 57 y.o. male here for continued evaluation and management of his metastatic lung adenocarcinoma. Patient was last seen by me on 10/20/2022 and complained of acid reflux, trembling, stable lower back pain, and phlegm in upper airway.   Today, patient notes he is doing well overall without any new or severe medical concerns. He reports he will see Dr. Leonides Schanz for his hemorrhoids caused from treatment. He also notes they have improved since being given blood thinners.  Patient also notes improved phlegm from a previous cough.   He does complain of red bumps on the bilateral arms and legs. He also notes left armpit that has resolved a few days ago. Patient denies it being attributed to his treatment.   Patient reports weight of 129 and reports aiming towards improving his p.o. intake and expressed the desire to do so.   He complains of occasional pain where his hernia surgery site was.   Patient denies diarrhea, swelling/tingling in hands/feet, SOB, neck pain, abdominal pain, chest pain. He reports that he is able to walk uphill without any significant issues and is breathing has improved.   He talks about receiving an attestation form from his provider to receive treatment at a [place].   Patient notes his father passed recently.    MEDICAL HISTORY:  Past Medical History:  Diagnosis Date   Anxiety    Bipolar disorder (HCC)    Blood in stool    Cancer (HCC)    Chronic bronchitis with emphysema (HCC)    pt denies  this   GERD (gastroesophageal reflux disease)    Lung cancer (HCC) dx'd 01/2019   Peripheral vascular disease (HCC)    blood clot in neck Sept 12, 2020    SURGICAL HISTORY: Past Surgical History:  Procedure Laterality Date   APPENDECTOMY     teenager   INGUINAL HERNIA REPAIR Right 2016, 2017   [facility], [facility]-removed mesh   IR IMAGING GUIDED PORT INSERTION  04/26/2019   TOOTH EXTRACTION N/A 03/14/2020   Procedure: DENTAL RESTORATION/EXTRACTIONS;  Surgeon: Ocie Doyne, DDS;  Location: [facility] OR;  Service: Oral Surgery;  Laterality: N/A;    SOCIAL HISTORY: Social History   Socioeconomic History   Marital status: Single    Spouse name: Not on file   Number of children: 1   Years of education: GED   Highest education level: Some college, no degree  Occupational History    Comment: fork lift driver  Tobacco Use   Smoking status: Former    Types: Cigars    Quit date: 01/20/2019    Years since quitting: 3.7   Smokeless tobacco: Never   Tobacco comments:    smokes 3 black and mild cigars per day  Vaping Use   Vaping Use: Never used  Substance and Sexual Activity   Alcohol use: Not Currently   Drug use: No   Sexual activity: Yes  Other Topics Concern   Not on file  Social History Narrative   Lives alone   Caffeine- coffee, 20 oz daily, tea occas  Social Determinants of Health   Financial Resource Strain: Low Risk  (09/29/2020)   Overall Financial Resource Strain (CARDIA)    Difficulty of Paying Living Expenses: Not very hard  Food Insecurity: No Food Insecurity (09/29/2020)   Hunger Vital Sign    Worried About Running Out of Food in the Last Year: Never true    Ran Out of Food in the Last Year: Never true  Transportation Needs: No Transportation Needs (09/29/2020)   PRAPARE - Administrator, Civil Service (Medical): No    Lack of Transportation (Non-Medical): No  Physical Activity: Sufficiently Active (09/29/2020)   Exercise Vital Sign     Days of Exercise per Week: 3 days    Minutes of Exercise per Session: 150+ min  Stress: No Stress Concern Present (09/29/2020)   [employer] of Occupational Health - Occupational Stress Questionnaire    Feeling of Stress : Not at all  Social Connections: Socially Isolated (09/29/2020)   Social Connection and Isolation Panel [NHANES]    Frequency of Communication with Friends and Family: More than three times a week    Frequency of Social Gatherings with Friends and Family: Once a week    Attends Religious Services: Never    Database administrator or Organizations: No    Attends Engineer, structural: Not on file    Marital Status: Never married  Catering manager Violence: Not on file    FAMILY HISTORY: Family History  Problem Relation Age of Onset   Prostate cancer Father    Colon cancer Neg Hx    Stomach cancer Neg Hx    Esophageal cancer Neg Hx     ALLERGIES:  is allergic to atorvastatin calcium.  MEDICATIONS:  Current Outpatient Medications  Medication Sig Dispense Refill   apixaban (ELIQUIS) 2.5 MG TABS tablet Take 1 tablet (2.5 mg total) by mouth 2 (two) times daily. 60 tablet 5   Cyanocobalamin (VITAMIN B 12 PO) Take 2,500 mcg by mouth daily.     esomeprazole (NEXIUM) 40 MG capsule TAKE 1 CAPSULE(40 MG) BY MOUTH DAILY 90 capsule 2   lactose free nutrition (BOOST PLUS) LIQD Drink one carton (237 ml) Boost Plus/equivalent three times daily in between meals  0   lidocaine-prilocaine (EMLA) cream Apply 1 application. topically as needed. 30 g 0   LORazepam (ATIVAN) 0.5 MG tablet Take 1 tablet (0.5 mg total) by mouth every 8 (eight) hours. 30 tablet 0   propranolol (INDERAL) 20 MG tablet TAKE 1 TABLET(20 MG) BY MOUTH TWICE DAILY 60 tablet 5   sildenafil (VIAGRA) 50 MG tablet Take 1 tablet (50 mg total) by mouth daily as needed for erectile dysfunction. Take 30 mins to 4 hours prior to sexual activity 30 tablet 1   No current facility-administered medications for  this visit.   REVIEW OF SYSTEMS:    10 Point review of Systems was done is negative except as noted above.   PHYSICAL EXAMINATION: ECOG FS:1 - Symptomatic but completely ambulatory .BP 116/78 (BP Location: Left Arm, Patient Position: Sitting)   Pulse 72   Temp 97.9 F (36.6 C) (Temporal)   Resp 18   Ht 5\' 11"  (1.803 m)   Wt 129 lb 9.6 oz (58.8 kg)   SpO2 100%   BMI 18.08 kg/m   Wt Readings from Last 3 Encounters:  09/29/22 132 lb 4.8 oz (60 kg)  09/23/22 132 lb 4 oz (60 kg)  09/08/22 132 lb 8 oz (60.1 kg)   There is  no height or weight on file to calculate BMI.    GENERAL:alert, in no acute distress and comfortable SKIN: no acute rashes, no significant lesions EYES: conjunctiva are pink and non-injected, sclera anicteric OROPHARYNX: MMM, no exudates, no oropharyngeal erythema or ulceration NECK: supple, no JVD LYMPH:  no palpable lymphadenopathy in the cervical, axillary or inguinal regions LUNGS: clear to auscultation b/l with normal respiratory effort HEART: regular rate & rhythm ABDOMEN:  normoactive bowel sounds , non tender, not distended. Extremity: no pedal edema PSYCH: alert & oriented x 3 with fluent speech NEURO: no focal motor/sensory deficits   LABORATORY DATA:  I have reviewed the data as listed    Latest Ref Rng & Units 10/20/2022    8:40 AM 09/29/2022    2:04 PM 09/08/2022    1:35 PM  CBC  WBC 4.0 - 10.5 K/uL 4.1  4.1  4.2   Hemoglobin 13.0 - 17.0 g/dL 16.1  09.6  04.5   Hematocrit 39.0 - 52.0 % 38.3  38.5  38.4   Platelets 150 - 400 K/uL 303  293  285       Latest Ref Rng & Units 10/20/2022    8:40 AM 09/29/2022    2:04 PM 09/08/2022    1:35 PM  CMP  Glucose 70 - 99 mg/dL 409  84  811   BUN 6 - 20 mg/dL 7  6  9    Creatinine 0.61 - 1.24 mg/dL 9.14  7.82  9.56   Sodium 135 - 145 mmol/L 141  137  137   Potassium 3.5 - 5.1 mmol/L 3.9  3.9  4.1   Chloride 98 - 111 mmol/L 106  104  105   CO2 22 - 32 mmol/L 29  28  28    Calcium 8.9 - 10.3 mg/dL 9.6   9.3  9.5   Total Protein 6.5 - 8.1 g/dL 7.0  6.8  7.0   Total Bilirubin 0.3 - 1.2 mg/dL 0.3  0.3  0.3   Alkaline Phos 38 - 126 U/L 94  89  99   AST 15 - 41 U/L 15  17  25    ALT 0 - 44 U/L 12  13  15     Lab Results  Component Value Date   IRON 16 (L) 05/05/2022   TIBC 448 05/05/2022   FERRITIN 52 10/20/2022    01/22/2019 [facility]: Tumor Mutational Burden     01/22/2019 PD-L1 Immunohistochemistry Analysis    01/22/2019 Soft Tissue Needle Core Biopsy Surgical Pathology     RADIOGRAPHIC STUDIES: I have personally reviewed the radiological images as listed and agreed with the findings in the report. No results found.   ASSESSMENT & PLAN:   This is a 57 year old male with   1. Metastatic Poorly differentiated lung adenocarcinoma Presented with Large neck mass, mediastinal mass, renal mass, and questionable bone lesions  no brain mets on MRI brain 01/22/2019 PD-L1 Immunohistochemistry Analysis which revealed "Tumor Proportion Score (TPS) 50%" 05/16/2019 PET/CT ([id number]) revealed "Radiation changes in the right hemithorax, as above. Improving mediastinal nodal metastases. Prior bulky right cervical metastases have resolved. No evidence of metastatic disease in the abdomen/pelvis." 07/19/2019 Esophagus Scan ([id number]) revealed "Mild stricture at the C6-7 level likely related to prior esophageal surgery. Barium tablet was slow to pass through this area but did pass through after 1 minutes. Hiatal hernia with moderate gastroesophageal reflux. Moderate stricture above the hiatal hernia likely due to reflux. Barium tablet did not pass this area. There are changes of  esophagitis which are likely due to reflux and possibly radiation as well."  10/10/19 of  CT Chest W Contrast ([id number])- no evidence of lung cancer progression at this time. He did have a PET CT scan on 04/13/2021 which was reviewed on the previous visit by Karena Addison.  This showed some borderline FDG avid right  paratracheal right hilar and azygoesophageal lymph nodes that were suspicious for possible nodal recurrence however the patient notes that he did have a bad upper respiratory tract infection around the time that he had his PET CT scan and therefore reactive adenopathy is a possibility as well.    2. 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 Symptomatic hemorrhoids-currently no significant bleeding but have caused some mild iron deficiency anemia.  5. Normocytic anemia -Likely due to underlying malignancy + Iron deficiency due to ongoing bleeding from hemorrhoids   6. S/pThrombocytosis -- likely due to paraneoplastic effect of tumor and reactive due to inflammation and tissue inflammation from RT.-- now resolved.   7. Nicotine dependence -has quit since cancer diagnosis  8. Iron deficiency - likely due to blood loss from hemorrhoids  9.  Erectile dysfunction-having partial benefits with using Viagra.  PLAN:   -Discussed lab results on 10/20/2022 in detail with patient. CBC was stable and showed WBC of 4.1K, hemoglobin of 12.4, and platelets of 303K. -Answered all of patient's questions and concerns -Will write up an attestation letter that provides the patient's diagnosis and general health summary -Patient has no lab or clinical evidence of lung cancer recurrence/progression at this time. -patient has no notable toxicities with immunotherapy at this time -continue Atezolizumab immunotherapy -continue Eliquis 2.5mg  BID today. -continue to follow with neurologist for optimal care of his eseential tremors -Patient has an appointment with Dr. Leonides Schanz on 11/04/2022 for address his hemorrhoidal bleeding  FOLLOW UP: -Per integrated scheduling continue Atezolizumab -Next MD visit in 6 weeks  The total time spent in the appointment was 23 minutes* .  All  of the patient's questions were answered with apparent satisfaction. The patient knows to call the clinic with any problems, questions or concerns.   [name] MD MS AAHIVMS [facility] Hematology/Oncology Physician [facility]  .*Total Encounter Time as defined by the Centers for Medicare and Medicaid Services includes, in addition to the face-to-face time of a patient visit (documented in the note above) non-face-to-face time: obtaining and reviewing outside history, ordering and reviewing medications, tests or procedures, care coordination (communications with other health care professionals or caregivers) and documentation in the medical record.    I,Mitra Faeizi,acting as a Neurosurgeon for [name], MD.,have documented all relevant documentation on the behalf of [name], MD,as directed by  [name], MD while in the presence of [name], MD.  .I have reviewed the above documentation for accuracy and completeness, and I agree with the above. [name] MD

## 2022-10-20 NOTE — Patient Instructions (Signed)
[place] [facility] AT [facility]  Discharge Instructions: Thank you for choosing [facility] to provide your oncology and hematology care.   If you have a lab appointment with the Cancer Center, please go directly to the Cancer Center and check in at the registration area.   Wear comfortable clothing and clothing appropriate for easy access to any Portacath or PICC line.   We strive to give you quality time with your provider. You may need to reschedule your appointment if you arrive late (15 or more minutes).  Arriving late affects you and other patients whose appointments are after yours.  Also, if you miss three or more appointments without notifying the office, you may be dismissed from the clinic at the provider's discretion.      For prescription refill requests, have your pharmacy contact our office and allow 72 hours for refills to be completed.    Today you received the following chemotherapy and/or immunotherapy agents: Atezolizumab      To help prevent nausea and vomiting after your treatment, we encourage you to take your nausea medication as directed.  BELOW ARE SYMPTOMS THAT SHOULD BE REPORTED IMMEDIATELY: *FEVER GREATER THAN 100.4 F (38 C) OR HIGHER *CHILLS OR SWEATING *NAUSEA AND VOMITING THAT IS NOT CONTROLLED WITH YOUR NAUSEA MEDICATION *UNUSUAL SHORTNESS OF BREATH *UNUSUAL BRUISING OR BLEEDING *URINARY PROBLEMS (pain or burning when urinating, or frequent urination) *BOWEL PROBLEMS (unusual diarrhea, constipation, pain near the anus) TENDERNESS IN MOUTH AND THROAT WITH OR WITHOUT PRESENCE OF ULCERS (sore throat, sores in mouth, or a toothache) UNUSUAL RASH, SWELLING OR PAIN  UNUSUAL VAGINAL DISCHARGE OR ITCHING   Items with * indicate a potential emergency and should be followed up as soon as possible or go to the Emergency Department if any problems should occur.  Please show the CHEMOTHERAPY ALERT CARD or IMMUNOTHERAPY ALERT CARD at  check-in to the Emergency Department and triage nurse.  Should you have questions after your visit or need to cancel or reschedule your appointment, please contact [place] [facility]  Dept: [phone]  and follow the prompts.  Office hours are 8:00 a.m. to 4:30 p.m. Monday - Friday. Please note that voicemails left after 4:00 p.m. may not be returned until the following business day.  We are closed weekends and major holidays. You have access to a nurse at all times for urgent questions. Please call the main number to the clinic Dept: [phone] and follow the prompts.   For any non-urgent questions, you may also contact your provider using MyChart. We now offer e-Visits for anyone 50 and older to request care online for non-urgent symptoms. For details visit [email or web address].   Also download the MyChart app! Go to the app store, search "MyChart", open the app, select , and log in with your MyChart username and password.

## 2022-10-22 ENCOUNTER — Telehealth: Payer: Self-pay | Admitting: Dietician

## 2022-10-22 NOTE — Telephone Encounter (Signed)
Contacted patient via telephone per request of RN Vanessa Kick) to assist patient ordering additional ONS via Medicaid supplement program.   Instructed patient to contact [employer] and provided number [phone]). Informed pt that he will need to contact monthly as needed to request additional shipment. Patient appreciative.

## 2022-10-24 ENCOUNTER — Other Ambulatory Visit: Payer: Self-pay

## 2022-10-26 ENCOUNTER — Encounter: Payer: Self-pay | Admitting: Physician Assistant

## 2022-10-26 ENCOUNTER — Encounter: Payer: Self-pay | Admitting: Hematology

## 2022-11-04 ENCOUNTER — Ambulatory Visit (INDEPENDENT_AMBULATORY_CARE_PROVIDER_SITE_OTHER): Payer: Medicare HMO | Admitting: Internal Medicine

## 2022-11-04 ENCOUNTER — Telehealth: Payer: Self-pay

## 2022-11-04 ENCOUNTER — Encounter: Payer: Self-pay | Admitting: Internal Medicine

## 2022-11-04 VITALS — BP 106/70 | HR 93 | Ht 71.0 in | Wt 128.0 lb

## 2022-11-04 DIAGNOSIS — K625 Hemorrhage of anus and rectum: Secondary | ICD-10-CM

## 2022-11-04 DIAGNOSIS — K648 Other hemorrhoids: Secondary | ICD-10-CM | POA: Diagnosis not present

## 2022-11-04 DIAGNOSIS — K219 Gastro-esophageal reflux disease without esophagitis: Secondary | ICD-10-CM | POA: Diagnosis not present

## 2022-11-04 DIAGNOSIS — R300 Dysuria: Secondary | ICD-10-CM

## 2022-11-04 DIAGNOSIS — D509 Iron deficiency anemia, unspecified: Secondary | ICD-10-CM

## 2022-11-04 MED ORDER — NA SULFATE-K SULFATE-MG SULF 17.5-3.13-1.6 GM/177ML PO SOLN: ORAL | 0 refills | Status: DC

## 2022-11-04 NOTE — Patient Instructions (Signed)
You have been scheduled for a CT scan of the abdomen and pelvis at [facility], 1st floor Radiology. You are scheduled on 11/14/22 at 11 am. You should arrive at 9 am to drink contrast.  You may take any medications as prescribed with a small amount of water, if necessary. If you take any of the following medications: METFORMIN, GLUCOPHAGE, GLUCOVANCE, AVANDAMET, RIOMET, FORTAMET, ACTOPLUS MET, JANUMET, GLUMETZA or METAGLIP, you MAY be asked to HOLD this medication 48 hours AFTER the exam.   The purpose of you drinking the oral contrast is to aid in the visualization of your intestinal tract. The contrast solution may cause some diarrhea. Depending on your individual set of symptoms, you may also receive an intravenous injection of x-ray contrast/dye. Plan on being at [facility] for 45 minutes or longer, depending on the type of exam you are having performed.   If you have any questions regarding your exam or if you need to reschedule, you may call [facility] at [phone] between the hours of 8:00 am and 5:00 pm, Monday-Friday.    You will be contacted by our office prior to your procedure for directions on holding your Eliquis.  If you do not hear from our office 1 week prior to your scheduled procedure, please call [phone] to discuss.    Start taking Miralax daily   You have been scheduled for an endoscopy and colonoscopy. Please follow the written instructions given to you at your visit today. Please pick up your prep supplies at the pharmacy within the next 1-3 days. If you use inhalers (even only as needed), please bring them with you on the day of your procedure. .  We have sent the following medications to your pharmacy for you to pick up at your convenience: Suprep  _______________________________________________________  If your blood pressure at your visit was 140/90 or greater, please contact your primary care physician to follow up on  this.  _______________________________________________________  If you are age 51 or older, your body mass index should be between 23-30. Your Body mass index is 17.85 kg/m. If this is out of the aforementioned range listed, please consider follow up with your Primary Care Provider.  If you are age 31 or younger, your body mass index should be between 19-25. Your Body mass index is 17.85 kg/m. If this is out of the aformentioned range listed, please consider follow up with your Primary Care Provider.   ________________________________________________________  The [facility] providers would like to encourage you to use [facility] to communicate with providers for non-urgent requests or questions.  Due to long hold times on the telephone, sending your provider a message by [facility] may be a faster and more efficient way to get a response.  Please allow 48 business hours for a response.  Please remember that this is for non-urgent requests.  _______________________________________________________   Due to recent changes in healthcare laws, you may see the results of your imaging and laboratory studies on MyChart before your provider has had a chance to review them.  We understand that in some cases there may be results that are confusing or concerning to you. Not all laboratory results come back in the same time frame and the provider may be waiting for multiple results in order to interpret others.  Please give [place] 48 hours in order for your provider to thoroughly review all the results before contacting the office for clarification of your results.    Thank you for entrusting  me with your care and for choosing [facility], Dr. Eulah Pont

## 2022-11-04 NOTE — Telephone Encounter (Addendum)
   David Berry. December 21, 1965 [id number]  Dear Dr [name]  We have scheduled the above named patient for a(n) Colonoscopy and Endoscopy procedure. Our records show that (s)he is on anticoagulation therapy.  Please advise as to whether the patient may come off their therapy of Eliquis 2 days prior to their procedure which is scheduled for 11/25/22.  Please route your response to [facility] or fax response to [phone].  Sincerely,    [facility]

## 2022-11-04 NOTE — Telephone Encounter (Signed)
David Berry. August 25, 1965 [id number]  Dear [name]  We have scheduled the above named patient for a(n) Colonoscopy and Endoscopy procedure. Our records show that (s)he is on anticoagulation therapy.  Please advise as to whether the patient may come off their therapy of Eliquis 2 days prior to their procedure which is scheduled for 11/25/22.  Please route your response to [facility] or fax response to [phone].  Sincerely,    [facility]

## 2022-11-04 NOTE — Progress Notes (Signed)
Referring Provider: Ellyn Hack, MD Primary Care Physician:  Ellyn Hack, MD   Chief complaint: Rectal bleeding   IMPRESSION:  Rectal bleeding on Eliquis IDA History of longstanding GERD with LA Class D reflux esophagitis on EGD 2021. Burning with urination Abnormal CT scan with wall thickening of the esophagus and wall thickening along the third portion of the duodenum Patient presents for follow-up of rectal bleeding, which has persisted despite topical steroids and 1 banding session.  At this time his bleeding is presumably due to hemorrhoids but would want to rule out alternative etiologies of rectal bleeding.  It has been about 3 years since his last colonoscopy and EGD.  Patient does still have occasional nausea and vomiting at times.  He has previously been noted to have grade D esophagitis, which typically warrants follow-up to ensure healing.  He has also had iron deficiency anemia that is being managed by Dr. [name]. Recent CT scan also showed thickening of the esophagus and wall thickening of the third part of the duodenum and proximal jejunum.  Thus we will get him scheduled for EGD and colonoscopy for further evaluation.  Not clear to me why he is defecating each time that he urinates.  He also describes some burning with urination.  Thus we will plan for a CT A/P with oral contrast to rule out a colovesicular fistula.  PLAN: - Continue esomeprazole 40 mg QD - Start daily MiraLAX - CT A/P with oral contrast (put in IV contrast as well but this can modified to w/wo if preferred by radiology) to rule out colovesicular fistula - Will plan for EGD/colonoscopy [facility]. Hold Eliquis 2 days beforehand. Will contact Dr. [name] to get permission.   HPI: David Berry. is a 57 y.o. male returns today in follow-up for rectal bleeding  Interval History: Since his last clinic visit, he has continued to have some signs of rectal bleeding.  His Eliquis has been decreased from 5  mg twice daily to 2.5 mg twice daily.  He has used Anusol HC cream, which has helped with rectal bleeding.  A few days ago the bleeding did seem to improve slightly.  He will still see some blood when he wipes with toilet paper.  When he urinates, he does notice that he has to poop each time that he urinates.  He does also endorse some burning when he pees.  Endorses some nausea.  He does feel like his reflux is under good control on daily Nexium therapy.  Labs 10/2022: CBC with low Hb of 12.4 (stable from prior). CMP unremarkable. Ferritin 52.  Endoscopic history: - Screening colonoscopy 07/09/2019 showed internal hemorrhoids and was otherwise normal - EGD for reflux 07/09/2019 showed LA class D reflux esophagitis, benign-appearing stenosis located 40 cm from the incisors that could not be traversed due to resistance.  A small rent developed with attempted endoscopic evaluation.  Abdominal imaging: - Barium swallow 07/19/2019 showed a mild stricture at C6-7, successful but slow passage of a barium tablet through this area, hiatal hernia with moderate reflux, and a moderate stricture above the hiatal hernia - CT of the chest, abdomen, and pelvis 11/08/2020 showed generalized mild smooth wall thickening throughout the thoracic esophagus unchanged.  Right paratracheal soft tissue enlargement.  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  No new or progressive metastatic disease. -CT C/A/P w/contrast 02/25/22: IMPRESSION: 1. Stable examination demonstrating extensive areas of postradiation mass-like fibrosis in the lungs bilaterally, with  no definitive findings to suggest locally recurrent disease or definite metastatic disease in the chest, abdomen or pelvis. 2. Previously noted right adrenal nodules are not readily apparent on today's examination. Attention on follow-up studies is recommended. 3. Heterogeneously enhancing lesion in the interpolar region of the right kidney  minimally increased in size compared to prior examinations. Prior MRI suggested a potential angiomyolipoma, although slow-growing renal cell carcinoma is difficult to exclude. Continued attention on follow-up studies is recommended. 4. Aortic atherosclerosis. -CT C/A/P w/contrast 08/11/22: IMPRESSION: Stable appearance of presumed postradiation changes with fibrosis along both hila and margins of the mediastinum. No new developing mass lesion or fluid collection. There are several prominent lymph nodes in the mediastinum which are stable as well as confluent soft tissue surrounding the trachea. Stable ectasia and wall thickening of the esophagus. Please correlate for any symptoms. Stable complex right-sided 16 mm renal lesion. Again an aggressive lesion is possible. Recommend dedicated workup or continued surveillance  Stable right adrenal nodule. New wall thickening along the third portion of the duodenal and very proximal jejunum with mild bowel ectasia in this location but no inflammatory changes or signs of obstruction. This is nonspecific. Please correlate with any particular symptoms attention on short follow-up   Past Medical History:  Diagnosis Date   Anxiety    Bipolar disorder (HCC)    Blood in stool    Cancer (HCC)    Chronic bronchitis with emphysema (HCC)    pt denies this   GERD (gastroesophageal reflux disease)    Lung cancer (HCC) dx'd 01/2019   Peripheral vascular disease (HCC)    blood clot in neck Sept 12, 2020    Past Surgical History:  Procedure Laterality Date   APPENDECTOMY     teenager   INGUINAL HERNIA REPAIR Right 2016, 2017   [facility], 2018 [facility]-removed mesh   IR IMAGING GUIDED PORT INSERTION  04/26/2019   TOOTH EXTRACTION N/A 03/14/2020   Procedure: DENTAL RESTORATION/EXTRACTIONS;  Surgeon: Ocie Doyne, DDS;  Location: [facility] OR;  Service: Oral Surgery;  Laterality: N/A;    Current Outpatient Medications  Medication Sig Dispense  Refill   apixaban (ELIQUIS) 2.5 MG TABS tablet Take 1 tablet (2.5 mg total) by mouth 2 (two) times daily. 60 tablet 5   Cyanocobalamin (VITAMIN B 12 PO) Take 2,500 mcg by mouth daily.     esomeprazole (NEXIUM) 40 MG capsule TAKE 1 CAPSULE(40 MG) BY MOUTH DAILY 90 capsule 2   lactose free nutrition (BOOST PLUS) LIQD Drink one carton (237 ml) Boost Plus/equivalent three times daily in between meals  0   lidocaine-prilocaine (EMLA) cream Apply 1 application. topically as needed. 30 g 0   LORazepam (ATIVAN) 0.5 MG tablet Take 1 tablet (0.5 mg total) by mouth every 8 (eight) hours. 30 tablet 0   propranolol (INDERAL) 20 MG tablet TAKE 1 TABLET(20 MG) BY MOUTH TWICE DAILY 60 tablet 5   sildenafil (VIAGRA) 50 MG tablet Take 1 tablet (50 mg total) by mouth daily as needed for erectile dysfunction. Take 30 mins to 4 hours prior to sexual activity 30 tablet 1   No current facility-administered medications for this visit.    Allergies as of 11/04/2022 - Review Complete 10/20/2022  Allergen Reaction Noted   Atorvastatin calcium Rash 12/30/2021    Family History  Problem Relation Age of Onset   Prostate cancer Father    Colon cancer Neg Hx    Stomach cancer Neg Hx    Esophageal cancer Neg Hx  Physical Exam: Vitals:   11/04/22 1541  BP: 106/70  Pulse: 93    Gen: Awake, alert, and oriented, and well communicative. HEENT: EOMI, non-icteric sclera, NCAT, MMM  Neck: Normal movement of head and neck  Pulm: No labored breathing, speaking in full sentences without conversational dyspnea  Abd: Non-tender. Non-distended Derm: No apparent lesions or bruising in visible field  MS: Moves all visible extremities without noticeable abnormality  Psych: Pleasant, cooperative, normal speech, thought processing seemingly intact     Eulah Pont, MD 11/04/2022, 3:43 PM  I spent 40 minutes of time, including in depth chart review, independent review of results as outlined above, communicating  results with the patient directly, face-to-face time with the patient, coordinating care, ordering studies and medications as appropriate, and documentation.

## 2022-11-10 ENCOUNTER — Inpatient Hospital Stay: Payer: Medicare HMO | Admitting: Hematology

## 2022-11-10 ENCOUNTER — Inpatient Hospital Stay: Payer: Medicare HMO

## 2022-11-10 ENCOUNTER — Ambulatory Visit: Payer: Medicare HMO

## 2022-11-10 ENCOUNTER — Inpatient Hospital Stay: Payer: Medicare HMO | Attending: Physician Assistant

## 2022-11-10 ENCOUNTER — Other Ambulatory Visit: Payer: Self-pay

## 2022-11-10 VITALS — BP 96/65 | HR 66 | Temp 98.2°F | Resp 18 | Ht 71.0 in | Wt 131.2 lb

## 2022-11-10 DIAGNOSIS — D509 Iron deficiency anemia, unspecified: Secondary | ICD-10-CM | POA: Diagnosis not present

## 2022-11-10 DIAGNOSIS — Z5112 Encounter for antineoplastic immunotherapy: Secondary | ICD-10-CM | POA: Insufficient documentation

## 2022-11-10 DIAGNOSIS — Z7189 Other specified counseling: Secondary | ICD-10-CM

## 2022-11-10 DIAGNOSIS — C3491 Malignant neoplasm of unspecified part of right bronchus or lung: Secondary | ICD-10-CM

## 2022-11-10 DIAGNOSIS — Z87891 Personal history of nicotine dependence: Secondary | ICD-10-CM | POA: Insufficient documentation

## 2022-11-10 DIAGNOSIS — C771 Secondary and unspecified malignant neoplasm of intrathoracic lymph nodes: Secondary | ICD-10-CM | POA: Insufficient documentation

## 2022-11-10 DIAGNOSIS — Z86718 Personal history of other venous thrombosis and embolism: Secondary | ICD-10-CM | POA: Diagnosis not present

## 2022-11-10 DIAGNOSIS — Z7901 Long term (current) use of anticoagulants: Secondary | ICD-10-CM | POA: Diagnosis not present

## 2022-11-10 DIAGNOSIS — Z79899 Other long term (current) drug therapy: Secondary | ICD-10-CM | POA: Diagnosis not present

## 2022-11-10 DIAGNOSIS — Z95828 Presence of other vascular implants and grafts: Secondary | ICD-10-CM

## 2022-11-10 LAB — CMP (CANCER CENTER ONLY)
ALT: 10 U/L (ref 0–44)
AST: 12 U/L — ABNORMAL LOW (ref 15–41)
Albumin: 3.9 g/dL (ref 3.5–5.0)
Alkaline Phosphatase: 90 U/L (ref 38–126)
Anion gap: 4 — ABNORMAL LOW (ref 5–15)
BUN: 9 mg/dL (ref 6–20)
CO2: 29 mmol/L (ref 22–32)
Calcium: 9.4 mg/dL (ref 8.9–10.3)
Chloride: 106 mmol/L (ref 98–111)
Creatinine: 0.8 mg/dL (ref 0.61–1.24)
GFR, Estimated: >60 mL/min (ref >60)
Glucose, Bld: 88 mg/dL (ref 70–99)
Potassium: 4.1 mmol/L (ref 3.5–5.1)
Sodium: 139 mmol/L (ref 135–145)
Total Bilirubin: 0.4 mg/dL (ref 0.3–1.2)
Total Protein: 6.1 g/dL — ABNORMAL LOW (ref 6.5–8.1)

## 2022-11-10 LAB — CBC WITH DIFFERENTIAL (CANCER CENTER ONLY)
Abs Immature Granulocytes: 0.01 10*3/uL (ref 0.00–0.07)
Basophils Absolute: 0 10*3/uL (ref 0.0–0.1)
Basophils Relative: 1 %
Eosinophils Absolute: 0 10*3/uL (ref 0.0–0.5)
Eosinophils Relative: 1 %
HCT: 39.1 % (ref 39.0–52.0)
Hemoglobin: 12.7 g/dL — ABNORMAL LOW (ref 13.0–17.0)
Immature Granulocytes: 0 %
Lymphocytes Relative: 24 %
Lymphs Abs: 0.8 10*3/uL (ref 0.7–4.0)
MCH: 29 pg (ref 26.0–34.0)
MCHC: 32.5 g/dL (ref 30.0–36.0)
MCV: 89.3 fL (ref 80.0–100.0)
Monocytes Absolute: 0.6 10*3/uL (ref 0.1–1.0)
Monocytes Relative: 17 %
Neutro Abs: 2 10*3/uL (ref 1.7–7.7)
Neutrophils Relative %: 57 %
Platelet Count: 279 10*3/uL (ref 150–400)
RBC: 4.38 MIL/uL (ref 4.22–5.81)
RDW: 15.4 % (ref 11.5–15.5)
WBC Count: 3.5 10*3/uL — ABNORMAL LOW (ref 4.0–10.5)
nRBC: 0 % (ref 0.0–0.2)

## 2022-11-10 LAB — TSH: TSH: 1.261 u[IU]/mL (ref 0.350–4.500)

## 2022-11-10 MED ORDER — SODIUM CHLORIDE 0.9% FLUSH
10.0000 mL | INTRAVENOUS | Status: DC | PRN
  Administered 2022-11-10: 10 mL

## 2022-11-10 MED ORDER — HEPARIN SOD (PORK) LOCK FLUSH 100 UNIT/ML IV SOLN
500.0000 [IU] | Freq: Once | INTRAVENOUS | Status: AC | PRN
  Administered 2022-11-10: 500 [IU]

## 2022-11-10 MED ORDER — FAMOTIDINE 20 MG PO TABS
20.0000 mg | ORAL_TABLET | Freq: Once | ORAL | Status: AC
  Administered 2022-11-10: 20 mg via ORAL
  Filled 2022-11-10: qty 1

## 2022-11-10 MED ORDER — SODIUM CHLORIDE 0.9 % IV SOLN: Freq: Once | INTRAVENOUS | Status: AC

## 2022-11-10 MED ORDER — SODIUM CHLORIDE 0.9 % IV SOLN
1200.0000 mg | Freq: Once | INTRAVENOUS | Status: AC
  Administered 2022-11-10: 1200 mg via INTRAVENOUS
  Filled 2022-11-10: qty 20

## 2022-11-10 MED ORDER — DIPHENHYDRAMINE HCL 25 MG PO CAPS
25.0000 mg | ORAL_CAPSULE | Freq: Once | ORAL | Status: AC
  Administered 2022-11-10: 25 mg via ORAL
  Filled 2022-11-10: qty 1

## 2022-11-10 MED ORDER — ACETAMINOPHEN 325 MG PO TABS
650.0000 mg | ORAL_TABLET | Freq: Once | ORAL | Status: AC
  Administered 2022-11-10: 650 mg via ORAL
  Filled 2022-11-10: qty 2

## 2022-11-10 NOTE — Patient Instructions (Signed)
[facility]  Discharge Instructions: Thank you for choosing [facility] to provide your oncology and hematology care.   If you have a lab appointment with the Cancer Center, please go directly to the Cancer Center and check in at the registration area.   Wear comfortable clothing and clothing appropriate for easy access to any Portacath or PICC line.   We strive to give you quality time with your provider. You may need to reschedule your appointment if you arrive late (15 or more minutes).  Arriving late affects you and other patients whose appointments are after yours.  Also, if you miss three or more appointments without notifying the office, you may be dismissed from the clinic at the provider's discretion.      For prescription refill requests, have your pharmacy contact our office and allow 72 hours for refills to be completed.    Today you received the following chemotherapy and/or immunotherapy agents: Atezolizumab      To help prevent nausea and vomiting after your treatment, we encourage you to take your nausea medication as directed.  BELOW ARE SYMPTOMS THAT SHOULD BE REPORTED IMMEDIATELY: *FEVER GREATER THAN 100.4 F (38 C) OR HIGHER *CHILLS OR SWEATING *NAUSEA AND VOMITING THAT IS NOT CONTROLLED WITH YOUR NAUSEA MEDICATION *UNUSUAL SHORTNESS OF BREATH *UNUSUAL BRUISING OR BLEEDING *URINARY PROBLEMS (pain or burning when urinating, or frequent urination) *BOWEL PROBLEMS (unusual diarrhea, constipation, pain near the anus) TENDERNESS IN MOUTH AND THROAT WITH OR WITHOUT PRESENCE OF ULCERS (sore throat, sores in mouth, or a toothache) UNUSUAL RASH, SWELLING OR PAIN  UNUSUAL VAGINAL DISCHARGE OR ITCHING   Items with * indicate a potential emergency and should be followed up as soon as possible or go to the Emergency Department if any problems should occur.  Please show the CHEMOTHERAPY ALERT CARD or IMMUNOTHERAPY ALERT CARD at  check-in to the Emergency Department and triage nurse.  Should you have questions after your visit or need to cancel or reschedule your appointment, please contact [facility]  Dept: [phone]  and follow the prompts.  Office hours are 8:00 a.m. to 4:30 p.m. Monday - Friday. Please note that voicemails left after 4:00 p.m. may not be returned until the following business day.  We are closed weekends and major holidays. You have access to a nurse at all times for urgent questions. Please call the main number to the clinic Dept: [phone] and follow the prompts.   For any non-urgent questions, you may also contact your provider using MyChart. We now offer e-Visits for anyone 18 and older to request care online for non-urgent symptoms. For details visit [email or web address].   Also download the MyChart app! Go to the app store, search "MyChart", open the app, select , and log in with your MyChart username and password.   

## 2022-11-12 ENCOUNTER — Telehealth: Payer: Self-pay

## 2022-11-12 NOTE — Telephone Encounter (Signed)
Received fax from Dr [name] okay to hold Eliquis 2 day's before procedure notified patient he is aware verbalized understanding.

## 2022-11-14 ENCOUNTER — Ambulatory Visit (HOSPITAL_COMMUNITY): Payer: Medicare HMO

## 2022-11-14 ENCOUNTER — Other Ambulatory Visit: Payer: Self-pay

## 2022-11-22 ENCOUNTER — Ambulatory Visit (HOSPITAL_COMMUNITY)
Admission: RE | Admit: 2022-11-22 | Discharge: 2022-11-22 | Disposition: A | Discharge status: Home / Self Care | Payer: Medicare HMO | Source: Ambulatory Visit | Attending: Internal Medicine | Admitting: Internal Medicine

## 2022-11-22 DIAGNOSIS — K625 Hemorrhage of anus and rectum: Secondary | ICD-10-CM | POA: Diagnosis present

## 2022-11-22 DIAGNOSIS — K219 Gastro-esophageal reflux disease without esophagitis: Secondary | ICD-10-CM | POA: Diagnosis present

## 2022-11-22 DIAGNOSIS — K648 Other hemorrhoids: Secondary | ICD-10-CM | POA: Diagnosis present

## 2022-11-22 DIAGNOSIS — D509 Iron deficiency anemia, unspecified: Secondary | ICD-10-CM

## 2022-11-22 DIAGNOSIS — R300 Dysuria: Secondary | ICD-10-CM | POA: Diagnosis present

## 2022-11-22 MED ORDER — IOHEXOL 9 MG/ML PO SOLN
1000.0000 mL | Freq: Once | ORAL | Status: AC
  Administered 2022-11-22: 1000 mL via ORAL

## 2022-11-22 MED ORDER — IOHEXOL 300 MG/ML  SOLN
100.0000 mL | Freq: Once | INTRAMUSCULAR | Status: AC | PRN
  Administered 2022-11-22: 100 mL via INTRAVENOUS

## 2022-11-22 MED ORDER — HEPARIN SOD (PORK) LOCK FLUSH 100 UNIT/ML IV SOLN
500.0000 [IU] | Freq: Once | INTRAVENOUS | Status: AC
  Administered 2022-11-22: 500 [IU] via INTRAVENOUS

## 2022-11-22 MED ORDER — HEPARIN SOD (PORK) LOCK FLUSH 100 UNIT/ML IV SOLN
INTRAVENOUS | Status: AC
  Filled 2022-11-22: qty 5

## 2022-11-25 ENCOUNTER — Ambulatory Visit (AMBULATORY_SURGERY_CENTER): Payer: Medicare HMO | Admitting: Internal Medicine

## 2022-11-25 ENCOUNTER — Encounter: Payer: Self-pay | Admitting: Internal Medicine

## 2022-11-25 VITALS — BP 112/76 | HR 81 | Temp 98.6°F | Resp 11 | Ht 71.0 in | Wt 128.0 lb

## 2022-11-25 DIAGNOSIS — K2270 Barrett's esophagus without dysplasia: Secondary | ICD-10-CM

## 2022-11-25 DIAGNOSIS — K2970 Gastritis, unspecified, without bleeding: Secondary | ICD-10-CM

## 2022-11-25 DIAGNOSIS — D509 Iron deficiency anemia, unspecified: Secondary | ICD-10-CM

## 2022-11-25 DIAGNOSIS — K31819 Angiodysplasia of stomach and duodenum without bleeding: Secondary | ICD-10-CM | POA: Diagnosis not present

## 2022-11-25 DIAGNOSIS — K222 Esophageal obstruction: Secondary | ICD-10-CM

## 2022-11-25 DIAGNOSIS — K648 Other hemorrhoids: Secondary | ICD-10-CM

## 2022-11-25 DIAGNOSIS — K5140 Inflammatory polyps of colon without complications: Secondary | ICD-10-CM

## 2022-11-25 DIAGNOSIS — D123 Benign neoplasm of transverse colon: Secondary | ICD-10-CM

## 2022-11-25 DIAGNOSIS — K2951 Unspecified chronic gastritis with bleeding: Secondary | ICD-10-CM

## 2022-11-25 MED ORDER — SODIUM CHLORIDE 0.9 % IV SOLN: 500.0000 mL | Freq: Once | INTRAVENOUS | Status: DC

## 2022-11-25 MED ORDER — HYDROCORTISONE (PERIANAL) 2.5 % EX CREA: 1.0000 | TOPICAL_CREAM | Freq: Two times a day (BID) | CUTANEOUS | 0 refills | Status: AC

## 2022-11-25 NOTE — Progress Notes (Signed)
Called to room to assist during endoscopic procedure.  Patient ID and intended procedure confirmed with present staff. Received instructions for my participation in the procedure from the performing physician.  

## 2022-11-25 NOTE — Progress Notes (Signed)
 Uneventful propofol anesthetic. Report to pacu rn. Vss. Care resumed by rn.

## 2022-11-25 NOTE — Op Note (Addendum)
[facility] Patient Name: David Berry Procedure Date: 11/25/2022 2:42 PM MRN: [id number] Endoscopist: Particia Lather , , [id number] Age: 56 Referring MD:  Date of Birth: 23-Jan-1966 Gender: Male Account #: [id number] Procedure:                Upper GI endoscopy Indications:              Iron deficiency anemia, Heartburn, Abnormal CT of                            the GI tract, Follow-up of esophagitis Medicines:                Monitored Anesthesia Care Procedure:                Pre-Anesthesia Assessment:                           - Prior to the procedure, a History and Physical                            was performed, and patient medications and                            allergies were reviewed. The patient's tolerance of                            previous anesthesia was also reviewed. The risks                            and benefits of the procedure and the sedation                            options and risks were discussed with the patient.                            All questions were answered, and informed consent                            was obtained. Prior Anticoagulants: The patient has                            taken Eliquis (apixaban), last dose was 2 days                            prior to procedure. ASA Grade Assessment: II - A                            patient with mild systemic disease. After reviewing                            the risks and benefits, the patient was deemed in                            satisfactory condition to undergo the procedure.  After obtaining informed consent, the endoscope was                            passed under direct vision. Throughout the                            procedure, the patient's blood pressure, pulse, and                            oxygen saturations were monitored continuously. The                            GIF [id number] #[id number] was introduced through the                             mouth, and advanced to the second part of duodenum.                            The upper GI endoscopy was accomplished without                            difficulty. The patient tolerated the procedure                            well. Scope In: Scope Out: Findings:                 One benign-appearing, intrinsic moderate                            (circumferential scarring or stenosis; an endoscope                            may pass) stenosis was found 38 cm from the                            incisors. This stenosis measured less than one cm                            (in length). The stenosis was traversed. A TTS                            dilator was passed through the scope. Dilation with                            a 12-13.5-15 mm x 8 cm CRE balloon dilator was                            performed to 15 mm. The dilation site was examined                            and showed mild mucosal disruption. Biopsies were  taken with a cold forceps for histology.                           A hiatal hernia was present.                           Salmon-colored mucosa was present. Mucosa was                            biopsied with a cold forceps for histology in 4                            quadrants at intervals of 2 cm from 38 to 42 cm                            from the incisors. A total of 2 specimen bottles                            were sent to pathology.                           Two medium angioectasias with no bleeding were                            found in the gastric body.                           Localized mild inflammation characterized by                            congestion (edema) and erythema was found in the                            gastric antrum. Biopsies were taken with a cold                            forceps for histology.                           The examined duodenum was normal. Biopsies were                            taken with a  cold forceps for histology. Complications:            No immediate complications. Estimated Blood Loss:     Estimated blood loss was minimal. Impression:               - Benign-appearing esophageal stenosis. Dilated.                            Biopsied.                           - Hiatal hernia.                           -  Salmon-colored mucosa classified as Barrett's                            stage C4-M4 per Prague criteria. Biopsied.                           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                           - Gastritis. Biopsied.                           - Normal examined duodenum. Biopsied. Recommendation:           - Await pathology results.                           - Could consider treatment of stomach angioctasias                            in the future if patient has worsening iron                            deficiency anemia.                           - Follow up in GI clinic in 2-3 months.                           - Perform a colonoscopy today. Dr Particia Lather "Alan Ripper" Leonides Schanz,  11/25/2022 3:38:58 PM

## 2022-11-25 NOTE — Op Note (Signed)
[facility] Patient Name: David Berry Procedure Date: 11/25/2022 2:37 PM MRN: [id number] Endoscopist: Madelyn Brunner Echo , , [phone] Age: 57 Referring MD:  Date of Birth: Jul 11, 1965 Gender: Male Account #: [id number] Procedure:                Colonoscopy Indications:              Rectal bleeding, Iron deficiency anemia Medicines:                Monitored Anesthesia Care Procedure:                Pre-Anesthesia Assessment:                           - Prior to the procedure, a History and Physical                            was performed, and patient medications and                            allergies were reviewed. The patient's tolerance of                            previous anesthesia was also reviewed. The risks                            and benefits of the procedure and the sedation                            options and risks were discussed with the patient.                            All questions were answered, and informed consent                            was obtained. Prior Anticoagulants: The patient has                            taken Eliquis (apixaban), last dose was 2 days                            prior to procedure. ASA Grade Assessment: II - A                            patient with mild systemic disease. After reviewing                            the risks and benefits, the patient was deemed in                            satisfactory condition to undergo the procedure.                           After obtaining informed consent, the colonoscope  was passed under direct vision. Throughout the                            procedure, the patient's blood pressure, pulse, and                            oxygen saturations were monitored continuously. The                            Olympus [id number]-[id number] (#[id number]) Colonoscope was                            introduced through the anus and advanced to the the                             terminal ileum. Scope In: 3:13:18 PM Scope Out: 3:26:36 PM Scope Withdrawal Time: 0 hours 10 minutes 16 seconds  Total Procedure Duration: 0 hours 13 minutes 18 seconds  Findings:                 The terminal ileum appeared normal.                           A 4 mm polyp was found in the transverse colon. The                            polyp was sessile. The polyp was removed with a                            cold snare. Resection and retrieval were complete.                           Non-bleeding internal hemorrhoids were found during                            retroflexion. Complications:            No immediate complications. Estimated Blood Loss:     Estimated blood loss was minimal. Impression:               - The examined portion of the ileum was normal.                           - One 4 mm polyp in the transverse colon, removed                            with a cold snare. Resected and retrieved.                           - Non-bleeding internal hemorrhoids. Recommendation:           - Discharge patient to home (with escort).                           - Await pathology results.                           -  Anusol HC cream BID for 7 days                           - Okay to restart Eliquis tomorrow.                           - The findings and recommendations were discussed                            with the patient. Dr Particia Lather "Alan Ripper" Leonides Schanz,  11/25/2022 3:41:25 PM

## 2022-11-25 NOTE — Progress Notes (Signed)
GASTROENTEROLOGY PROCEDURE H&P NOTE   Primary Care Physician: Ellyn Hack, MD    Reason for Procedure:   IDA, rectal bleeding, history of esophagitis, GERD, abnormal CT scan  Plan:    EGD/colonoscopy  Patient is appropriate for endoscopic procedure(s) in the ambulatory ([facility]) setting.  The nature of the procedure, as well as the risks, benefits, and alternatives were carefully and thoroughly reviewed with the patient. Ample time for discussion and questions allowed. The patient understood, was satisfied, and agreed to proceed.     HPI: David Berry. is a 57 y.o. male who presents for EGD/colonoscopy for evaluation of IDA, rectal bleeding, history of esophagitis, GERD, abnormal CT scan .  Patient was most recently seen in the Gastroenterology Clinic on 11/04/22.  No interval change in medical history since that appointment. Please refer to that note for full details regarding GI history and clinical presentation.   Past Medical History:  Diagnosis Date   Anxiety    Bipolar disorder (HCC)    Blood in stool    Cancer (HCC)    Chronic bronchitis with emphysema    pt denies this   GERD (gastroesophageal reflux disease)    Lung cancer (HCC) dx'd 01/2019   Peripheral vascular disease (HCC)    blood clot in neck Sept 12, 2020    Past Surgical History:  Procedure Laterality Date   APPENDECTOMY     teenager   INGUINAL HERNIA REPAIR Right 2016, 2017   [facility], [facility]-removed mesh   IR IMAGING GUIDED PORT INSERTION  04/26/2019   TOOTH EXTRACTION N/A 03/14/2020   Procedure: DENTAL RESTORATION/EXTRACTIONS;  Surgeon: Ocie Doyne, DDS;  Location: [facility] OR;  Service: Oral Surgery;  Laterality: N/A;    Prior to Admission medications   Medication Sig Start Date End Date Taking? Authorizing Provider  Cyanocobalamin (VITAMIN B 12 PO) Take 2,500 mcg by mouth daily.   Yes [provider]  lidocaine-prilocaine (EMLA) cream Apply 1 application. topically  as needed. 09/16/21  Yes [name], MD  LORazepam (ATIVAN) 0.5 MG tablet Take 1 tablet (0.5 mg total) by mouth every 8 (eight) hours. 04/14/22  Yes [name], MD  propranolol (INDERAL) 20 MG tablet TAKE 1 TABLET(20 MG) BY MOUTH TWICE DAILY 11/09/21  Yes Vaslow, Georgeanna Lea, MD  apixaban (ELIQUIS) 2.5 MG TABS tablet Take 1 tablet (2.5 mg total) by mouth 2 (two) times daily. 08/19/22   [name], MD  esomeprazole (NEXIUM) 40 MG capsule TAKE 1 CAPSULE(40 MG) BY MOUTH DAILY 07/05/22   Tressia Danas, MD  lactose free nutrition (BOOST PLUS) LIQD Drink one carton (237 ml) Boost Plus/equivalent three times daily in between meals Patient not taking: Reported on 11/25/2022 12/14/21   [name], MD  sildenafil (VIAGRA) 50 MG tablet Take 1 tablet (50 mg total) by mouth daily as needed for erectile dysfunction. Take 30 mins to 4 hours prior to sexual activity 10/05/22   [name], MD    Current Outpatient Medications  Medication Sig Dispense Refill   Cyanocobalamin (VITAMIN B 12 PO) Take 2,500 mcg by mouth daily.     lidocaine-prilocaine (EMLA) cream Apply 1 application. topically as needed. 30 g 0   LORazepam (ATIVAN) 0.5 MG tablet Take 1 tablet (0.5 mg total) by mouth every 8 (eight) hours. 30 tablet 0   propranolol (INDERAL) 20 MG tablet TAKE 1 TABLET(20 MG) BY MOUTH TWICE DAILY 60 tablet 5   apixaban (ELIQUIS) 2.5 MG TABS  tablet Take 1 tablet (2.5 mg total) by mouth 2 (two) times daily. 60 tablet 5   esomeprazole (NEXIUM) 40 MG capsule TAKE 1 CAPSULE(40 MG) BY MOUTH DAILY 90 capsule 2   lactose free nutrition (BOOST PLUS) LIQD Drink one carton (237 ml) Boost Plus/equivalent three times daily in between meals (Patient not taking: Reported on 11/25/2022)  0   sildenafil (VIAGRA) 50 MG tablet Take 1 tablet (50 mg total) by mouth daily as needed for erectile dysfunction. Take 30 mins to 4 hours prior to sexual activity 30 tablet 1   Current Facility-Administered  Medications  Medication Dose Route Frequency Provider Last Rate Last Admin   0.9 %  sodium chloride infusion  500 mL Intravenous Once Imogene Burn, MD        Allergies as of 11/25/2022 - Review Complete 11/25/2022  Allergen Reaction Noted   Atorvastatin calcium Rash 12/30/2021    Family History  Problem Relation Age of Onset   Prostate cancer Father    Colon cancer Neg Hx    Stomach cancer Neg Hx    Esophageal cancer Neg Hx     Social History   Socioeconomic History   Marital status: Single    Spouse name: Not on file   Number of children: 1   Years of education: GED   Highest education level: Some college, no degree  Occupational History    Comment: fork lift driver  Tobacco Use   Smoking status: Former    Types: Cigars    Quit date: 01/20/2019    Years since quitting: 3.8   Smokeless tobacco: Never   Tobacco comments:    smokes 3 black and mild cigars per day  Vaping Use   Vaping status: Never Used  Substance and Sexual Activity   Alcohol use: Not Currently   Drug use: No   Sexual activity: Yes  Other Topics Concern   Not on file  Social History Narrative   Lives alone   Caffeine- coffee, 20 oz daily, tea occas   Social Determinants of Health   Financial Resource Strain: Low Risk  (09/29/2020)   Overall Financial Resource Strain (CARDIA)    Difficulty of Paying Living Expenses: Not very hard  Food Insecurity: No Food Insecurity (09/29/2020)   Hunger Vital Sign    Worried About Running Out of Food in the Last Year: Never true    Ran Out of Food in the Last Year: Never true  Transportation Needs: No Transportation Needs (09/29/2020)   PRAPARE - Administrator, Civil Service (Medical): No    Lack of Transportation (Non-Medical): No  Physical Activity: Sufficiently Active (09/29/2020)   Exercise Vital Sign    Days of Exercise per Week: 3 days    Minutes of Exercise per Session: 150+ min  Stress: No Stress Concern Present (09/29/2020)   [employer] of Occupational Health - Occupational Stress Questionnaire    Feeling of Stress : Not at all  Social Connections: Unknown (03/01/2022)   Received from [facility]   Social Network    Social Network: Not on file  Intimate Partner Violence: Unknown (03/01/2022)   Received from [facility]   HITS    Physically Hurt: Not on file    Insult or Talk Down To: Not on file    Threaten Physical Harm: Not on file    Scream or Curse: Not on file    Physical Exam: Vital signs in last 24 hours: BP 113/71   Pulse 82  Temp 98.6 F (37 C)   Ht 5\' 11"  (1.803 m)   Wt 128 lb (58.1 kg)   SpO2 98%   BMI 17.85 kg/m  GEN: NAD EYE: Sclerae anicteric ENT: MMM CV: Non-tachycardic Pulm: No increased WOB GI: Soft NEURO:  Alert & Oriented   Eulah Pont, MD [facility]   11/25/2022 2:35 PM

## 2022-11-25 NOTE — Patient Instructions (Addendum)
Resume previous diet Continue present medications, RESUME ELIQUIS ON TOMORROW, FRIDAY JULY 19TH. PICK UP NEW RX FOR ANUSOL hc CREAM TWICE DAILY FOR 7 DAYS Await pathology results FOLLOW UP VISIT WITH COLLEEN ON 9/30 AT 11 AM, 3RD FLOOR OF THIS BUILDING Handouts/information given for Gastritis, Barrett's Esophagus, esophageal stricture, esophageal dilation, polyps, and hemorrhoids  YOU HAD AN ENDOSCOPIC PROCEDURE TODAY AT THE [facility]:   Refer to the procedure report that was given to you for any specific questions about what was found during the examination.  If the procedure report does not answer your questions, please call your gastroenterologist to clarify.  If you requested that your care partner not be given the details of your procedure findings, then the procedure report has been included in a sealed envelope for you to review at your convenience later.  YOU SHOULD EXPECT: Some feelings of bloating in the abdomen. Passage of more gas than usual.  Walking can help get rid of the air that was put into your GI tract during the procedure and reduce the bloating. If you had a lower endoscopy (such as a colonoscopy or flexible sigmoidoscopy) you may notice spotting of blood in your stool or on the toilet paper. If you underwent a bowel prep for your procedure, you may not have a normal bowel movement for a few days.  Please Note:  You might notice some irritation and congestion in your nose or some drainage.  This is from the oxygen used during your procedure.  There is no need for concern and it should clear up in a day or so.  SYMPTOMS TO REPORT IMMEDIATELY:  Following lower endoscopy (colonoscopy):  Excessive amounts of blood in the stool  Significant tenderness or worsening of abdominal pains  Swelling of the abdomen that is new, acute  Fever of 100F or higher Following upper endoscopy (EGD)  Vomiting of blood or coffee ground material  New chest pain or pain under the  shoulder blades  Painful or persistently difficult swallowing  New shortness of breath  Black, tarry-looking stools  For urgent or emergent issues, a gastroenterologist can be reached at any hour by calling [phone]. Do not use MyChart messaging for urgent concerns.   DIET:  We do recommend a small meal at first, but then you may proceed to your regular diet.  Drink plenty of fluids but you should avoid alcoholic beverages for 24 hours.  ACTIVITY:  You should plan to take it easy for the rest of today and you should NOT DRIVE or use heavy machinery until tomorrow (because of the sedation medicines used during the test).    FOLLOW UP: Our staff will call the number listed on your records the next business day following your procedure.  We will call around 7:15- 8:00 am to check on you and address any questions or concerns that you may have regarding the information given to you following your procedure. If we do not reach you, we will leave a message.     If any biopsies were taken you will be contacted by phone or by letter within the next 1-3 weeks.  Please call us at [phone] if you have not heard about the biopsies in 3 weeks.    SIGNATURES/CONFIDENTIALITY: You and/or your care partner have signed paperwork which will be entered into your electronic medical record.  These signatures attest to the fact that that the information above on your After Visit Summary has been reviewed and is understood.  Full responsibility of the confidentiality of this discharge information lies with you and/or your care-partner.

## 2022-11-26 ENCOUNTER — Telehealth: Payer: Self-pay

## 2022-11-26 NOTE — Telephone Encounter (Signed)
  Follow up Call-     11/25/2022    2:14 PM  Call back number  Post procedure Call Back phone  # [phone]  Permission to leave phone message Yes    Post op call attempted, no answer, left WM.

## 2022-11-29 ENCOUNTER — Other Ambulatory Visit: Payer: Self-pay

## 2022-11-29 DIAGNOSIS — R300 Dysuria: Secondary | ICD-10-CM

## 2022-12-01 ENCOUNTER — Inpatient Hospital Stay (HOSPITAL_BASED_OUTPATIENT_CLINIC_OR_DEPARTMENT_OTHER): Payer: Medicare HMO | Admitting: Physician Assistant

## 2022-12-01 ENCOUNTER — Inpatient Hospital Stay: Payer: Medicare HMO

## 2022-12-01 ENCOUNTER — Other Ambulatory Visit: Payer: Self-pay

## 2022-12-01 ENCOUNTER — Ambulatory Visit: Payer: Medicare HMO

## 2022-12-01 ENCOUNTER — Ambulatory Visit: Payer: Medicare HMO | Admitting: Hematology

## 2022-12-01 ENCOUNTER — Other Ambulatory Visit: Payer: Medicare HMO

## 2022-12-01 ENCOUNTER — Encounter: Payer: Self-pay | Admitting: Internal Medicine

## 2022-12-01 DIAGNOSIS — Z7189 Other specified counseling: Secondary | ICD-10-CM

## 2022-12-01 DIAGNOSIS — C3491 Malignant neoplasm of unspecified part of right bronchus or lung: Secondary | ICD-10-CM

## 2022-12-01 DIAGNOSIS — Z95828 Presence of other vascular implants and grafts: Secondary | ICD-10-CM

## 2022-12-01 DIAGNOSIS — R300 Dysuria: Secondary | ICD-10-CM

## 2022-12-01 DIAGNOSIS — Z5112 Encounter for antineoplastic immunotherapy: Secondary | ICD-10-CM | POA: Diagnosis not present

## 2022-12-01 LAB — CBC WITH DIFFERENTIAL (CANCER CENTER ONLY)
Abs Immature Granulocytes: 0 10*3/uL (ref 0.00–0.07)
Basophils Absolute: 0.1 10*3/uL (ref 0.0–0.1)
Basophils Relative: 1 %
Eosinophils Absolute: 0 10*3/uL (ref 0.0–0.5)
Eosinophils Relative: 1 %
HCT: 38.6 % — ABNORMAL LOW (ref 39.0–52.0)
Hemoglobin: 12.8 g/dL — ABNORMAL LOW (ref 13.0–17.0)
Immature Granulocytes: 0 %
Lymphocytes Relative: 24 %
Lymphs Abs: 0.9 10*3/uL (ref 0.7–4.0)
MCH: 29.5 pg (ref 26.0–34.0)
MCHC: 33.2 g/dL (ref 30.0–36.0)
MCV: 88.9 fL (ref 80.0–100.0)
Monocytes Absolute: 0.6 10*3/uL (ref 0.1–1.0)
Monocytes Relative: 17 %
Neutro Abs: 2.1 10*3/uL (ref 1.7–7.7)
Neutrophils Relative %: 57 %
Platelet Count: 281 10*3/uL (ref 150–400)
RBC: 4.34 MIL/uL (ref 4.22–5.81)
RDW: 15.5 % (ref 11.5–15.5)
WBC Count: 3.7 10*3/uL — ABNORMAL LOW (ref 4.0–10.5)
nRBC: 0 % (ref 0.0–0.2)

## 2022-12-01 LAB — CMP (CANCER CENTER ONLY)
ALT: 8 U/L (ref 0–44)
AST: 13 U/L — ABNORMAL LOW (ref 15–41)
Albumin: 4.1 g/dL (ref 3.5–5.0)
Alkaline Phosphatase: 93 U/L (ref 38–126)
Anion gap: 5 (ref 5–15)
BUN: 9 mg/dL (ref 6–20)
CO2: 28 mmol/L (ref 22–32)
Calcium: 9.4 mg/dL (ref 8.9–10.3)
Chloride: 107 mmol/L (ref 98–111)
Creatinine: 0.8 mg/dL (ref 0.61–1.24)
GFR, Estimated: >60 mL/min (ref >60)
Glucose, Bld: 92 mg/dL (ref 70–99)
Potassium: 4.1 mmol/L (ref 3.5–5.1)
Sodium: 140 mmol/L (ref 135–145)
Total Bilirubin: 0.4 mg/dL (ref 0.3–1.2)
Total Protein: 6.4 g/dL — ABNORMAL LOW (ref 6.5–8.1)

## 2022-12-01 LAB — TSH: TSH: 1.569 u[IU]/mL (ref 0.350–4.500)

## 2022-12-01 MED ORDER — SODIUM CHLORIDE 0.9 % IV SOLN: Freq: Once | INTRAVENOUS | Status: AC

## 2022-12-01 MED ORDER — LIDOCAINE-PRILOCAINE 2.5-2.5 % EX CREA: 1.0000 | TOPICAL_CREAM | CUTANEOUS | 0 refills | Status: AC | PRN

## 2022-12-01 MED ORDER — SODIUM CHLORIDE 0.9 % IV SOLN
1200.0000 mg | Freq: Once | INTRAVENOUS | Status: AC
  Administered 2022-12-01: 1200 mg via INTRAVENOUS
  Filled 2022-12-01: qty 20

## 2022-12-01 MED ORDER — ACETAMINOPHEN 325 MG PO TABS
650.0000 mg | ORAL_TABLET | Freq: Once | ORAL | Status: AC
  Administered 2022-12-01: 650 mg via ORAL
  Filled 2022-12-01: qty 2

## 2022-12-01 MED ORDER — DIPHENHYDRAMINE HCL 25 MG PO CAPS
25.0000 mg | ORAL_CAPSULE | Freq: Once | ORAL | Status: AC
  Administered 2022-12-01: 25 mg via ORAL
  Filled 2022-12-01: qty 1

## 2022-12-01 MED ORDER — SODIUM CHLORIDE 0.9% FLUSH
10.0000 mL | INTRAVENOUS | Status: DC | PRN
  Administered 2022-12-01: 10 mL

## 2022-12-01 MED ORDER — FAMOTIDINE 20 MG PO TABS
20.0000 mg | ORAL_TABLET | Freq: Once | ORAL | Status: AC
  Administered 2022-12-01: 20 mg via ORAL
  Filled 2022-12-01: qty 1

## 2022-12-01 NOTE — Patient Instructions (Signed)
[place] [facility] AT [facility]  Discharge Instructions: Thank you for choosing [facility] to provide your oncology and hematology care.   If you have a lab appointment with the Cancer Center, please go directly to the Cancer Center and check in at the registration area.   Wear comfortable clothing and clothing appropriate for easy access to any Portacath or PICC line.   We strive to give you quality time with your provider. You may need to reschedule your appointment if you arrive late (15 or more minutes).  Arriving late affects you and other patients whose appointments are after yours.  Also, if you miss three or more appointments without notifying the office, you may be dismissed from the clinic at the provider's discretion.      For prescription refill requests, have your pharmacy contact our office and allow 72 hours for refills to be completed.    Today you received the following chemotherapy and/or immunotherapy agents: Atezolizumab      To help prevent nausea and vomiting after your treatment, we encourage you to take your nausea medication as directed.  BELOW ARE SYMPTOMS THAT SHOULD BE REPORTED IMMEDIATELY: *FEVER GREATER THAN 100.4 F (38 C) OR HIGHER *CHILLS OR SWEATING *NAUSEA AND VOMITING THAT IS NOT CONTROLLED WITH YOUR NAUSEA MEDICATION *UNUSUAL SHORTNESS OF BREATH *UNUSUAL BRUISING OR BLEEDING *URINARY PROBLEMS (pain or burning when urinating, or frequent urination) *BOWEL PROBLEMS (unusual diarrhea, constipation, pain near the anus) TENDERNESS IN MOUTH AND THROAT WITH OR WITHOUT PRESENCE OF ULCERS (sore throat, sores in mouth, or a toothache) UNUSUAL RASH, SWELLING OR PAIN  UNUSUAL VAGINAL DISCHARGE OR ITCHING   Items with * indicate a potential emergency and should be followed up as soon as possible or go to the Emergency Department if any problems should occur.  Please show the CHEMOTHERAPY ALERT CARD or IMMUNOTHERAPY ALERT CARD at  check-in to the Emergency Department and triage nurse.  Should you have questions after your visit or need to cancel or reschedule your appointment, please contact [place] [facility]  Dept: [phone]  and follow the prompts.  Office hours are 8:00 a.m. to 4:30 p.m. Monday - Friday. Please note that voicemails left after 4:00 p.m. may not be returned until the following business day.  We are closed weekends and major holidays. You have access to a nurse at all times for urgent questions. Please call the main number to the clinic Dept: [phone] and follow the prompts.   For any non-urgent questions, you may also contact your provider using MyChart. We now offer e-Visits for anyone 50 and older to request care online for non-urgent symptoms. For details visit [email or web address].   Also download the MyChart app! Go to the app store, search "MyChart", open the app, select , and log in with your MyChart username and password.

## 2022-12-02 ENCOUNTER — Encounter: Payer: Self-pay | Admitting: Physician Assistant

## 2022-12-02 ENCOUNTER — Encounter: Payer: Self-pay | Admitting: Hematology

## 2022-12-02 NOTE — Progress Notes (Signed)
HEMATOLOGY/ONCOLOGY CLINIC NOTE  Date of Service: 12/01/22   Patient Care Team: Ellyn Hack, MD as PCP - General (Family Medicine)   CHIEF COMPLAINTS/PURPOSE OF CONSULTATION:  Follow-up for continued evaluation and management of metastatic lung adenocarcinoma  HISTORY OF PRESENTING ILLNESS:  Please see previous notes for details on initial presentation.  Current Treatment:  Atezolizumab maintenance   INTERVAL HISTORY:  Nathan Moctezuma. Is a 57 y.o. male here for continued evaluation and management of his metastatic lung adenocarcinoma. He was last seen by Dr. [name] on 10/20/2022. He is unaccompanied for this visit.   Mr. Hughey Rittenberry reports he is tolerating Atezolizumab therapy without any toxicities. He reports his energy and appetite are stable. He reports occasional nausea/acid reflux mainly in the morning. He denies any vomiting. He reports unchanged bowel movements without recurrent episodes of diarrhea or constipation.The hemorrhoid pain and bleeding has improved. 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 He denies fevers, chills, sweats, shortness of breath, chest pain or cough. He has no other complaints.   MEDICAL HISTORY:  Past Medical History:  Diagnosis Date   Anxiety    Bipolar disorder (HCC)    Blood in stool    Cancer (HCC)    Chronic bronchitis with emphysema    pt denies this   GERD (gastroesophageal reflux disease)    Lung cancer (HCC) dx'd 01/2019   Peripheral vascular disease (HCC)    blood clot in neck Sept 12, 2020    SURGICAL HISTORY: Past Surgical History:  Procedure Laterality Date   APPENDECTOMY     teenager   INGUINAL HERNIA REPAIR Right 2016, 2017   [facility], [facility]-removed mesh   IR IMAGING GUIDED PORT INSERTION  04/26/2019   TOOTH EXTRACTION N/A 03/14/2020   Procedure: DENTAL RESTORATION/EXTRACTIONS;  Surgeon: Ocie Doyne, DDS;  Location: [facility] OR;  Service: Oral Surgery;   Laterality: N/A;    SOCIAL HISTORY: Social History   Socioeconomic History   Marital status: Single    Spouse name: Not on file   Number of children: 1   Years of education: GED   Highest education level: Some college, no degree  Occupational History    Comment: fork lift driver  Tobacco Use   Smoking status: Former    Types: Cigars    Quit date: 01/20/2019    Years since quitting: 3.8   Smokeless tobacco: Never   Tobacco comments:    smokes 3 black and mild cigars per day  Vaping Use   Vaping status: Never Used  Substance and Sexual Activity   Alcohol use: Not Currently   Drug use: No   Sexual activity: Yes  Other Topics Concern   Not on file  Social History Narrative   Lives alone   Caffeine- coffee, 20 oz daily, tea occas   Social Determinants of Health   Financial Resource Strain: Low Risk  (09/29/2020)   Overall Financial Resource Strain (CARDIA)    Difficulty of Paying Living Expenses: Not very hard  Food Insecurity: No Food Insecurity (09/29/2020)   Hunger Vital Sign    Worried About Running Out of Food in the Last Year: Never true    Ran Out of Food in the Last Year: Never true  Transportation Needs: No Transportation Needs (09/29/2020)   PRAPARE - Administrator, Civil Service (Medical): No    Lack of Transportation (Non-Medical): No  Physical Activity: Sufficiently  Active (09/29/2020)   Exercise Vital Sign    Days of Exercise per Week: 3 days    Minutes of Exercise per Session: 150+ min  Stress: No Stress Concern Present (09/29/2020)   [employer] of Occupational Health - Occupational Stress Questionnaire    Feeling of Stress : Not at all  Social Connections: Unknown (03/01/2022)   [facility] from [facility]   Social Network    Social Network: Not on file  Intimate Partner Violence: Unknown (03/01/2022)   Received from [facility]   HITS    Physically Hurt: Not on file    Insult or Talk Down To: Not on file    Threaten Physical  Harm: Not on file    Scream or Curse: Not on file    FAMILY HISTORY: Family History  Problem Relation Age of Onset   Prostate cancer Father    Colon cancer Neg Hx    Stomach cancer Neg Hx    Esophageal cancer Neg Hx     ALLERGIES:  is allergic to atorvastatin calcium.  MEDICATIONS:  Current Outpatient Medications  Medication Sig Dispense Refill   apixaban (ELIQUIS) 2.5 MG TABS tablet Take 1 tablet (2.5 mg total) by mouth 2 (two) times daily. 60 tablet 5   Cyanocobalamin (VITAMIN B 12 PO) Take 2,500 mcg by mouth daily.     esomeprazole (NEXIUM) 40 MG capsule TAKE 1 CAPSULE(40 MG) BY MOUTH DAILY 90 capsule 2   LORazepam (ATIVAN) 0.5 MG tablet Take 1 tablet (0.5 mg total) by mouth every 8 (eight) hours. (Patient taking differently: Take 0.5 mg by mouth every 8 (eight) hours as needed.) 30 tablet 0   propranolol (INDERAL) 20 MG tablet TAKE 1 TABLET(20 MG) BY MOUTH TWICE DAILY 60 tablet 5   sildenafil (VIAGRA) 50 MG tablet Take 1 tablet (50 mg total) by mouth daily as needed for erectile dysfunction. Take 30 mins to 4 hours prior to sexual activity 30 tablet 1   hydrocortisone (ANUSOL-HC) 2.5 % rectal cream Place 1 Application rectally 2 (two) times daily for 7 days. (Patient not taking: Reported on 12/01/2022) 30 g 0   lactose free nutrition (BOOST PLUS) LIQD Drink one carton (237 ml) Boost Plus/equivalent three times daily in between meals (Patient not taking: Reported on 11/25/2022)  0   lidocaine-prilocaine (EMLA) cream Apply 1 Application topically as needed. 30 g 0   No current facility-administered medications for this visit.   REVIEW OF SYSTEMS:   10 Point review of Systems was done is negative except as noted above.  PHYSICAL EXAMINATION: ECOG FS:1 - Symptomatic but completely ambulatory  .BP 107/67   Pulse 73   Temp 97.7 F (36.5 C)   Resp 20   Wt 132 lb 9.6 oz (60.1 kg)   SpO2 99%   BMI 18.49 kg/m    Wt Readings from Last 3 Encounters:  12/01/22 132 lb 9.6 oz (60.1  kg)  11/25/22 128 lb (58.1 kg)  11/10/22 131 lb 4 oz (59.5 kg)   Body mass index is 18.49 kg/m.    GENERAL:alert, in no acute distress and comfortable SKIN: no acute rashes, no significant lesions EYES: conjunctiva are pink and non-injected, sclera anicteric LYMPH:  no palpable lymphadenopathy in the cervical or cervical regions LUNGS: clear to auscultation b/l with normal respiratory effort HEART: regular rate & rhythm Extremity: no pedal edema PSYCH: alert & oriented x 3 with fluent speech NEURO: no focal motor/sensory deficits  LABORATORY DATA:  I have reviewed the data as listed  Latest Ref Rng & Units 12/01/2022   12:47 PM 11/10/2022    8:05 AM 10/20/2022    8:40 AM  CBC  WBC 4.0 - 10.5 K/uL 3.7  3.5  4.1   Hemoglobin 13.0 - 17.0 g/dL 24.4  01.0  27.2   Hematocrit 39.0 - 52.0 % 38.6  39.1  38.3   Platelets 150 - 400 K/uL 281  279  303       Latest Ref Rng & Units 12/01/2022   12:47 PM 11/10/2022    8:05 AM 10/20/2022    8:40 AM  CMP  Glucose 70 - 99 mg/dL 92  88  536   BUN 6 - 20 mg/dL 9  9  7    Creatinine 0.61 - 1.24 mg/dL 6.44  0.34  7.42   Sodium 135 - 145 mmol/L 140  139  141   Potassium 3.5 - 5.1 mmol/L 4.1  4.1  3.9   Chloride 98 - 111 mmol/L 107  106  106   CO2 22 - 32 mmol/L 28  29  29    Calcium 8.9 - 10.3 mg/dL 9.4  9.4  9.6   Total Protein 6.5 - 8.1 g/dL 6.4  6.1  7.0   Total Bilirubin 0.3 - 1.2 mg/dL 0.4  0.4  0.3   Alkaline Phos 38 - 126 U/L 93  90  94   AST 15 - 41 U/L 13  12  15    ALT 0 - 44 U/L 8  10  12     Lab Results  Component Value Date   IRON 16 (L) 05/05/2022   TIBC 448 05/05/2022   FERRITIN 52 10/20/2022    01/22/2019 [employer]: Tumor Mutational Burden     01/22/2019 PD-L1 Immunohistochemistry Analysis    01/22/2019 Soft Tissue Needle Core Biopsy Surgical Pathology     RADIOGRAPHIC STUDIES: I have personally reviewed the radiological images as listed and agreed with the findings in the report. CT ABDOMEN PELVIS W  CONTRAST  Result Date: 11/29/2022 CLINICAL DATA:  Rectal bleeding. Internal hemorrhoids. Iron deficiency anemia. Clinical concern for colovesical fistula. Gastroesophageal reflux. Burning with urination. EXAM: CT ABDOMEN AND PELVIS WITH CONTRAST TECHNIQUE: Multidetector CT imaging of the abdomen and pelvis was performed using the standard protocol following bolus administration of intravenous contrast. RADIATION DOSE REDUCTION: This exam was performed according to the departmental dose-optimization program which includes automated exposure control, adjustment of the mA and/or kV according to patient size and/or use of iterative reconstruction technique. CONTRAST:  OMNIPAQUE IOHEXOL 300 MG/ML  SOLN COMPARISON:  Chest, abdomen and pelvis.  CT dated 08/11/2022 FINDINGS: Lower chest: Stable minimal scarring at both lung bases. Hepatobiliary: Stable mild focal fat deposition adjacent to the falciform ligament. Normal-appearing gallbladder. Pancreas: Unremarkable. No pancreatic ductal dilatation or surrounding inflammatory changes. Spleen: Normal-sized and shape. Multiple small calcified granulomata. Adrenals/Urinary Tract: The previously demonstrated 1.5 x 0.8 cm right adrenal nodule measures 1.5 x 1.1 cm on image number 20/2. Unremarkable left adrenal gland. The previously demonstrated 1.6 x 1.4 cm heterogeneous right renal mass measures 1.5 x 1.5 cm on image number 23/2. Unremarkable left kidney, ureters and urinary bladder. Stomach/Bowel: Unremarkable stomach, small bowel and colon. Surgically absent appendix. The previously demonstrated proximal small bowel wall thickening is no longer seen. Vascular/Lymphatic: No significant vascular findings are present. No enlarged abdominal or pelvic lymph nodes. Reproductive: Stable moderately enlarged prostate gland. Other: No abdominal wall hernia or abnormality. No abdominopelvic ascites. Musculoskeletal: Stable left iliac bone island. Minimal lumbar and  lower  thoracic spine degenerative changes. IMPRESSION: 1. No acute abnormality. 2. Stable 1.5 cm right renal mass, suspicious for renal cell carcinoma. 3. Stable 1.5 cm right adrenal nodule, indeterminate. 4. Stable moderately enlarged prostate gland. Electronically Signed   By: Beckie Salts M.D.   On: 11/29/2022 15:21     ASSESSMENT & PLAN:  Semir Brill. Is a 57 y.o. male who returns for a follow up for metastatic lung cancer.    1. Metastatic Poorly differentiated lung adenocarcinoma -Presented with Large neck mass, mediastinal mass, renal mass, and questionable bone lesions -No brain mets on MRI brain -01/22/2019 PD-L1 Immunohistochemistry Analysis which revealed "Tumor Proportion Score (TPS) 50%" -Started Atezolizumab on 03/21/2019. Added Carbo/taxol with Cycle 2 on 04/10/2019. After 6 cycles, transitioned to maintenance Atezolizumab q 21 days.  -Most recent CT CAP from 08/11/2022 showed no obvious progression of his cancer but some thickening of duodenum and jejunum   2. h/o  Impending SVC syndrome: --s/p pallaitive RT  3.h/o Acute DVT- Right IJ, Right Innominate Vein, and likely also the SVC --Related to malignancy and tobacco use --On long term Eliquis but decreased to 2.5 mg BID due to report of BRB with wiping after BM secondary to hemorrhoids.  4. Symptomatic hemorrhoids --Currently no significant bleeding but have caused some mild iron deficiency anemia.  5. Normocytic anemia -Likely due to underlying malignancy + Iron deficiency due to ongoing bleeding from hemorrhoids   6.Thrombocytosis--resolved --Likely due to paraneoplastic effect of tumor and reactive due to inflammation and tissue inflammation from RT..   7. Nicotine dependence -Has quit since cancer diagnosis  8. Iron deficiency  -Likely due to blood loss from hemorrhoids -Last received IV venofer 300 mg x 2 doses from 09/16/2022-09/29/2022.  9.  Erectile dysfunction --Having partial benefits with using  Viagra.  PLAN:  --Due to Atezolizumab treatment today.  --Labs from today were reviewed and adequate for treatment. WBC 3.7, Hgb 12.8, Plt 281, Creatinine and LFTs normal.  --No other prohibitive toxicities identified so proceed with atezolizumab without any dose modifications  FOLLOW UP: Continue atezolizumab for integrated scheduling   All of the patient's questions were answered with apparent satisfaction. The patient knows to call the clinic with any problems, questions or concerns.  I have spent a total of 30 minutes minutes of face-to-face and non-face-to-face time, preparing to see the patient, performing a medically appropriate examination, counseling and educating the patient, ordering tests/procedures, referring and communicating with other health care professionals, documenting clinical information in the electronic health record, and care coordination.   Georga Kaufmann PA-C Dept of Hematology and Oncology [facility] Phone: [phone]

## 2022-12-08 ENCOUNTER — Other Ambulatory Visit: Payer: Self-pay

## 2022-12-08 DIAGNOSIS — R300 Dysuria: Secondary | ICD-10-CM

## 2022-12-08 DIAGNOSIS — C3491 Malignant neoplasm of unspecified part of right bronchus or lung: Secondary | ICD-10-CM

## 2022-12-09 ENCOUNTER — Inpatient Hospital Stay: Payer: Medicare HMO | Attending: Physician Assistant

## 2022-12-09 ENCOUNTER — Other Ambulatory Visit: Payer: Self-pay

## 2022-12-09 DIAGNOSIS — C771 Secondary and unspecified malignant neoplasm of intrathoracic lymph nodes: Secondary | ICD-10-CM | POA: Insufficient documentation

## 2022-12-09 DIAGNOSIS — Z79899 Other long term (current) drug therapy: Secondary | ICD-10-CM | POA: Diagnosis not present

## 2022-12-09 DIAGNOSIS — Z7901 Long term (current) use of anticoagulants: Secondary | ICD-10-CM | POA: Insufficient documentation

## 2022-12-09 DIAGNOSIS — D509 Iron deficiency anemia, unspecified: Secondary | ICD-10-CM | POA: Diagnosis not present

## 2022-12-09 DIAGNOSIS — C3491 Malignant neoplasm of unspecified part of right bronchus or lung: Secondary | ICD-10-CM | POA: Diagnosis present

## 2022-12-09 DIAGNOSIS — Z5112 Encounter for antineoplastic immunotherapy: Secondary | ICD-10-CM | POA: Insufficient documentation

## 2022-12-09 DIAGNOSIS — Z87891 Personal history of nicotine dependence: Secondary | ICD-10-CM | POA: Diagnosis not present

## 2022-12-09 DIAGNOSIS — R300 Dysuria: Secondary | ICD-10-CM

## 2022-12-09 DIAGNOSIS — Z86718 Personal history of other venous thrombosis and embolism: Secondary | ICD-10-CM | POA: Diagnosis not present

## 2022-12-09 LAB — CBC WITH DIFFERENTIAL (CANCER CENTER ONLY)
Abs Immature Granulocytes: 0.01 10*3/uL (ref 0.00–0.07)
Basophils Absolute: 0.1 10*3/uL (ref 0.0–0.1)
Basophils Relative: 1 %
Eosinophils Absolute: 0 10*3/uL (ref 0.0–0.5)
Eosinophils Relative: 1 %
HCT: 40.4 % (ref 39.0–52.0)
Hemoglobin: 13.7 g/dL (ref 13.0–17.0)
Immature Granulocytes: 0 %
Lymphocytes Relative: 24 %
Lymphs Abs: 1.3 10*3/uL (ref 0.7–4.0)
MCH: 29.8 pg (ref 26.0–34.0)
MCHC: 33.9 g/dL (ref 30.0–36.0)
MCV: 87.8 fL (ref 80.0–100.0)
Monocytes Absolute: 0.9 10*3/uL (ref 0.1–1.0)
Monocytes Relative: 17 %
Neutro Abs: 3.1 10*3/uL (ref 1.7–7.7)
Neutrophils Relative %: 57 %
Platelet Count: 311 10*3/uL (ref 150–400)
RBC: 4.6 MIL/uL (ref 4.22–5.81)
RDW: 15.3 % (ref 11.5–15.5)
WBC Count: 5.5 10*3/uL (ref 4.0–10.5)
nRBC: 0 % (ref 0.0–0.2)

## 2022-12-09 LAB — CMP (CANCER CENTER ONLY)
ALT: 9 U/L (ref 0–44)
AST: 13 U/L — ABNORMAL LOW (ref 15–41)
Albumin: 4.4 g/dL (ref 3.5–5.0)
Alkaline Phosphatase: 98 U/L (ref 38–126)
Anion gap: 6 (ref 5–15)
BUN: 11 mg/dL (ref 6–20)
CO2: 26 mmol/L (ref 22–32)
Calcium: 9.3 mg/dL (ref 8.9–10.3)
Chloride: 104 mmol/L (ref 98–111)
Creatinine: 1.02 mg/dL (ref 0.61–1.24)
GFR, Estimated: >60 mL/min (ref >60)
Glucose, Bld: 97 mg/dL (ref 70–99)
Potassium: 3.9 mmol/L (ref 3.5–5.1)
Sodium: 136 mmol/L (ref 135–145)
Total Bilirubin: 0.4 mg/dL (ref 0.3–1.2)
Total Protein: 7 g/dL (ref 6.5–8.1)

## 2022-12-09 LAB — URINALYSIS, COMPLETE (UACMP) WITH MICROSCOPIC
Bacteria, UA: NONE SEEN
Bilirubin Urine: NEGATIVE
Glucose, UA: NEGATIVE mg/dL
Ketones, ur: NEGATIVE mg/dL
Nitrite: NEGATIVE
Protein, ur: NEGATIVE mg/dL
Specific Gravity, Urine: 1.013 (ref 1.005–1.030)
pH: 6 (ref 5.0–8.0)

## 2022-12-13 ENCOUNTER — Other Ambulatory Visit: Payer: Self-pay | Admitting: Internal Medicine

## 2022-12-17 ENCOUNTER — Telehealth: Payer: Self-pay | Admitting: Hematology

## 2022-12-22 ENCOUNTER — Inpatient Hospital Stay: Payer: Medicare HMO

## 2022-12-23 ENCOUNTER — Other Ambulatory Visit: Payer: Self-pay

## 2022-12-23 ENCOUNTER — Inpatient Hospital Stay: Payer: Medicare HMO

## 2022-12-23 VITALS — BP 93/66 | HR 65 | Temp 97.8°F | Resp 18

## 2022-12-23 DIAGNOSIS — C3491 Malignant neoplasm of unspecified part of right bronchus or lung: Secondary | ICD-10-CM

## 2022-12-23 DIAGNOSIS — Z7189 Other specified counseling: Secondary | ICD-10-CM

## 2022-12-23 DIAGNOSIS — Z5112 Encounter for antineoplastic immunotherapy: Secondary | ICD-10-CM | POA: Diagnosis not present

## 2022-12-23 DIAGNOSIS — Z95828 Presence of other vascular implants and grafts: Secondary | ICD-10-CM

## 2022-12-23 LAB — CMP (CANCER CENTER ONLY)
ALT: 12 U/L (ref 0–44)
AST: 14 U/L — ABNORMAL LOW (ref 15–41)
Albumin: 4.2 g/dL (ref 3.5–5.0)
Alkaline Phosphatase: 107 U/L (ref 38–126)
Anion gap: 4 — ABNORMAL LOW (ref 5–15)
BUN: 8 mg/dL (ref 6–20)
CO2: 29 mmol/L (ref 22–32)
Calcium: 9.1 mg/dL (ref 8.9–10.3)
Chloride: 105 mmol/L (ref 98–111)
Creatinine: 0.81 mg/dL (ref 0.61–1.24)
GFR, Estimated: >60 mL/min (ref >60)
Glucose, Bld: 105 mg/dL — ABNORMAL HIGH (ref 70–99)
Potassium: 4.2 mmol/L (ref 3.5–5.1)
Sodium: 138 mmol/L (ref 135–145)
Total Bilirubin: 0.3 mg/dL (ref 0.3–1.2)
Total Protein: 6.9 g/dL (ref 6.5–8.1)

## 2022-12-23 LAB — CBC WITH DIFFERENTIAL (CANCER CENTER ONLY)
Abs Immature Granulocytes: 0.01 10*3/uL (ref 0.00–0.07)
Basophils Absolute: 0 10*3/uL (ref 0.0–0.1)
Basophils Relative: 1 %
Eosinophils Absolute: 0 10*3/uL (ref 0.0–0.5)
Eosinophils Relative: 1 %
HCT: 42.3 % (ref 39.0–52.0)
Hemoglobin: 14.2 g/dL (ref 13.0–17.0)
Immature Granulocytes: 0 %
Lymphocytes Relative: 20 %
Lymphs Abs: 1 10*3/uL (ref 0.7–4.0)
MCH: 29.7 pg (ref 26.0–34.0)
MCHC: 33.6 g/dL (ref 30.0–36.0)
MCV: 88.5 fL (ref 80.0–100.0)
Monocytes Absolute: 0.7 10*3/uL (ref 0.1–1.0)
Monocytes Relative: 14 %
Neutro Abs: 3.1 10*3/uL (ref 1.7–7.7)
Neutrophils Relative %: 64 %
Platelet Count: 299 10*3/uL (ref 150–400)
RBC: 4.78 MIL/uL (ref 4.22–5.81)
RDW: 14.6 % (ref 11.5–15.5)
WBC Count: 4.9 10*3/uL (ref 4.0–10.5)
nRBC: 0 % (ref 0.0–0.2)

## 2022-12-23 LAB — TSH: TSH: 1.037 u[IU]/mL (ref 0.350–4.500)

## 2022-12-23 MED ORDER — SODIUM CHLORIDE 0.9 % IV SOLN: Freq: Once | INTRAVENOUS | Status: AC

## 2022-12-23 MED ORDER — SODIUM CHLORIDE 0.9% FLUSH
10.0000 mL | INTRAVENOUS | Status: DC | PRN
  Administered 2022-12-23: 10 mL

## 2022-12-23 MED ORDER — ONDANSETRON HCL 4 MG PO TABS: 4.0000 mg | ORAL_TABLET | Freq: Three times a day (TID) | ORAL | 1 refills | Status: AC | PRN

## 2022-12-23 MED ORDER — FAMOTIDINE 20 MG PO TABS
20.0000 mg | ORAL_TABLET | Freq: Once | ORAL | Status: AC
  Administered 2022-12-23: 20 mg via ORAL
  Filled 2022-12-23: qty 1

## 2022-12-23 MED ORDER — ACETAMINOPHEN 325 MG PO TABS
650.0000 mg | ORAL_TABLET | Freq: Once | ORAL | Status: AC
  Administered 2022-12-23: 650 mg via ORAL
  Filled 2022-12-23: qty 2

## 2022-12-23 MED ORDER — SODIUM CHLORIDE 0.9 % IV SOLN
1200.0000 mg | Freq: Once | INTRAVENOUS | Status: AC
  Administered 2022-12-23: 1200 mg via INTRAVENOUS
  Filled 2022-12-23: qty 20

## 2022-12-23 MED ORDER — HEPARIN SOD (PORK) LOCK FLUSH 100 UNIT/ML IV SOLN
500.0000 [IU] | Freq: Once | INTRAVENOUS | Status: AC | PRN
  Administered 2022-12-23: 500 [IU]

## 2022-12-23 MED ORDER — SODIUM CHLORIDE 0.9% FLUSH
10.0000 mL | Freq: Once | INTRAVENOUS | Status: AC | PRN
  Administered 2022-12-23: 10 mL

## 2022-12-23 MED ORDER — DIPHENHYDRAMINE HCL 25 MG PO CAPS
25.0000 mg | ORAL_CAPSULE | Freq: Once | ORAL | Status: AC
  Administered 2022-12-23: 25 mg via ORAL
  Filled 2022-12-23: qty 1

## 2022-12-23 NOTE — Patient Instructions (Signed)
[facility]  Discharge Instructions: Thank you for choosing [facility] to provide your oncology and hematology care.   If you have a lab appointment with the [facility], please go directly to the [facility] and check in at the registration area.   Wear comfortable clothing and clothing appropriate for easy access to any Portacath or PICC line.   We strive to give you quality time with your provider. You may need to reschedule your appointment if you arrive late (15 or more minutes).  Arriving late affects you and other patients whose appointments are after yours.  Also, if you miss three or more appointments without notifying the office, you may be dismissed from the clinic at the provider's discretion.      For prescription refill requests, have your pharmacy contact our office and allow 72 hours for refills to be completed.    Today you received the following chemotherapy and/or immunotherapy agents Tecentriq      To help prevent nausea and vomiting after your treatment, we encourage you to take your nausea medication as directed.  BELOW ARE SYMPTOMS THAT SHOULD BE REPORTED IMMEDIATELY: *FEVER GREATER THAN 100.4 F (38 C) OR HIGHER *CHILLS OR SWEATING *NAUSEA AND VOMITING THAT IS NOT CONTROLLED WITH YOUR NAUSEA MEDICATION *UNUSUAL SHORTNESS OF BREATH *UNUSUAL BRUISING OR BLEEDING *URINARY PROBLEMS (pain or burning when urinating, or frequent urination) *BOWEL PROBLEMS (unusual diarrhea, constipation, pain near the anus) TENDERNESS IN MOUTH AND THROAT WITH OR WITHOUT PRESENCE OF ULCERS (sore throat, sores in mouth, or a toothache) UNUSUAL RASH, SWELLING OR PAIN  UNUSUAL VAGINAL DISCHARGE OR ITCHING   Items with * indicate a potential emergency and should be followed up as soon as possible or go to the Emergency Department if any problems should occur.  Please show the CHEMOTHERAPY ALERT CARD or IMMUNOTHERAPY ALERT CARD at  check-in to the Emergency Department and triage nurse.  Should you have questions after your visit or need to cancel or reschedule your appointment, please contact [facility]  Dept: [phone]  and follow the prompts.  Office hours are 8:00 a.m. to 4:30 p.m. Monday - Friday. Please note that voicemails left after 4:00 p.m. may not be returned until the following business day.  We are closed weekends and major holidays. You have access to a nurse at all times for urgent questions. Please call the main number to the clinic Dept: [phone] and follow the prompts.   For any non-urgent questions, you may also contact your provider using MyChart. We now offer e-Visits for anyone 18 and older to request care online for non-urgent symptoms. For details visit [email or web address].   Also download the MyChart app! Go to the app store, search "MyChart", open the app, select [place], and log in with your MyChart username and password. 

## 2022-12-24 ENCOUNTER — Other Ambulatory Visit: Payer: Self-pay | Admitting: Hematology

## 2022-12-24 ENCOUNTER — Other Ambulatory Visit: Payer: Self-pay

## 2022-12-24 ENCOUNTER — Inpatient Hospital Stay: Payer: Medicare HMO

## 2022-12-24 DIAGNOSIS — C3491 Malignant neoplasm of unspecified part of right bronchus or lung: Secondary | ICD-10-CM

## 2022-12-24 DIAGNOSIS — Z5112 Encounter for antineoplastic immunotherapy: Secondary | ICD-10-CM | POA: Diagnosis not present

## 2022-12-24 LAB — URINALYSIS, COMPLETE (UACMP) WITH MICROSCOPIC
Bilirubin Urine: NEGATIVE
Glucose, UA: NEGATIVE mg/dL
Hgb urine dipstick: NEGATIVE
Ketones, ur: NEGATIVE mg/dL
Nitrite: NEGATIVE
Protein, ur: NEGATIVE mg/dL
Specific Gravity, Urine: 1.005 (ref 1.005–1.030)
WBC, UA: >50 WBC/hpf (ref 0–5)
pH: 7 (ref 5.0–8.0)

## 2022-12-24 MED ORDER — BOOST PLUS PO LIQD: ORAL | Status: DC

## 2022-12-24 MED ORDER — CEFPODOXIME PROXETIL 100 MG PO TABS: 100.0000 mg | ORAL_TABLET | Freq: Two times a day (BID) | ORAL | 0 refills | Status: AC

## 2022-12-25 LAB — URINE CULTURE: Culture: NO GROWTH

## 2023-01-12 ENCOUNTER — Inpatient Hospital Stay: Payer: Medicare HMO | Attending: Physician Assistant | Admitting: Hematology

## 2023-01-12 ENCOUNTER — Inpatient Hospital Stay: Payer: Medicare HMO

## 2023-01-12 VITALS — BP 112/78 | HR 81 | Temp 97.9°F | Resp 20 | Wt 131.1 lb

## 2023-01-12 DIAGNOSIS — Z79899 Other long term (current) drug therapy: Secondary | ICD-10-CM | POA: Diagnosis not present

## 2023-01-12 DIAGNOSIS — Z7189 Other specified counseling: Secondary | ICD-10-CM | POA: Diagnosis not present

## 2023-01-12 DIAGNOSIS — Z95828 Presence of other vascular implants and grafts: Secondary | ICD-10-CM

## 2023-01-12 DIAGNOSIS — Z87891 Personal history of nicotine dependence: Secondary | ICD-10-CM | POA: Diagnosis not present

## 2023-01-12 DIAGNOSIS — Z7901 Long term (current) use of anticoagulants: Secondary | ICD-10-CM | POA: Insufficient documentation

## 2023-01-12 DIAGNOSIS — D509 Iron deficiency anemia, unspecified: Secondary | ICD-10-CM | POA: Diagnosis not present

## 2023-01-12 DIAGNOSIS — Z5112 Encounter for antineoplastic immunotherapy: Secondary | ICD-10-CM | POA: Diagnosis not present

## 2023-01-12 DIAGNOSIS — C771 Secondary and unspecified malignant neoplasm of intrathoracic lymph nodes: Secondary | ICD-10-CM | POA: Insufficient documentation

## 2023-01-12 DIAGNOSIS — Z86718 Personal history of other venous thrombosis and embolism: Secondary | ICD-10-CM | POA: Diagnosis not present

## 2023-01-12 DIAGNOSIS — C3491 Malignant neoplasm of unspecified part of right bronchus or lung: Secondary | ICD-10-CM

## 2023-01-12 LAB — TSH: TSH: 1.396 u[IU]/mL (ref 0.350–4.500)

## 2023-01-12 LAB — CBC WITH DIFFERENTIAL (CANCER CENTER ONLY)
Abs Immature Granulocytes: 0.01 10*3/uL (ref 0.00–0.07)
Basophils Absolute: 0 10*3/uL (ref 0.0–0.1)
Basophils Relative: 1 %
Eosinophils Absolute: 0 10*3/uL (ref 0.0–0.5)
Eosinophils Relative: 1 %
HCT: 39 % (ref 39.0–52.0)
Hemoglobin: 13.5 g/dL (ref 13.0–17.0)
Immature Granulocytes: 0 %
Lymphocytes Relative: 18 %
Lymphs Abs: 0.9 10*3/uL (ref 0.7–4.0)
MCH: 30.6 pg (ref 26.0–34.0)
MCHC: 34.6 g/dL (ref 30.0–36.0)
MCV: 88.4 fL (ref 80.0–100.0)
Monocytes Absolute: 0.7 10*3/uL (ref 0.1–1.0)
Monocytes Relative: 14 %
Neutro Abs: 3.2 10*3/uL (ref 1.7–7.7)
Neutrophils Relative %: 66 %
Platelet Count: 274 10*3/uL (ref 150–400)
RBC: 4.41 MIL/uL (ref 4.22–5.81)
RDW: 14.1 % (ref 11.5–15.5)
WBC Count: 4.8 10*3/uL (ref 4.0–10.5)
nRBC: 0 % (ref 0.0–0.2)

## 2023-01-12 LAB — CMP (CANCER CENTER ONLY)
ALT: 12 U/L (ref 0–44)
AST: 14 U/L — ABNORMAL LOW (ref 15–41)
Albumin: 4.1 g/dL (ref 3.5–5.0)
Alkaline Phosphatase: 106 U/L (ref 38–126)
Anion gap: 6 (ref 5–15)
BUN: 9 mg/dL (ref 6–20)
CO2: 29 mmol/L (ref 22–32)
Calcium: 9.3 mg/dL (ref 8.9–10.3)
Chloride: 106 mmol/L (ref 98–111)
Creatinine: 0.81 mg/dL (ref 0.61–1.24)
GFR, Estimated: >60 mL/min (ref >60)
Glucose, Bld: 95 mg/dL (ref 70–99)
Potassium: 4 mmol/L (ref 3.5–5.1)
Sodium: 141 mmol/L (ref 135–145)
Total Bilirubin: 0.4 mg/dL (ref 0.3–1.2)
Total Protein: 6.9 g/dL (ref 6.5–8.1)

## 2023-01-12 MED ORDER — FAMOTIDINE 20 MG PO TABS
20.0000 mg | ORAL_TABLET | Freq: Once | ORAL | Status: AC
  Administered 2023-01-12: 20 mg via ORAL
  Filled 2023-01-12: qty 1

## 2023-01-12 MED ORDER — ACETAMINOPHEN 325 MG PO TABS
650.0000 mg | ORAL_TABLET | Freq: Once | ORAL | Status: AC
  Administered 2023-01-12: 650 mg via ORAL
  Filled 2023-01-12: qty 2

## 2023-01-12 MED ORDER — SODIUM CHLORIDE 0.9 % IV SOLN
1200.0000 mg | Freq: Once | INTRAVENOUS | Status: AC
  Administered 2023-01-12: 1200 mg via INTRAVENOUS
  Filled 2023-01-12: qty 20

## 2023-01-12 MED ORDER — DIPHENHYDRAMINE HCL 25 MG PO CAPS
25.0000 mg | ORAL_CAPSULE | Freq: Once | ORAL | Status: AC
  Administered 2023-01-12: 25 mg via ORAL
  Filled 2023-01-12: qty 1

## 2023-01-12 MED ORDER — SODIUM CHLORIDE 0.9% FLUSH
10.0000 mL | Freq: Once | INTRAVENOUS | Status: AC | PRN
  Administered 2023-01-12: 10 mL

## 2023-01-12 MED ORDER — SODIUM CHLORIDE 0.9 % IV SOLN: Freq: Once | INTRAVENOUS | Status: AC

## 2023-01-12 NOTE — Progress Notes (Signed)
Patient seen by Dr. Kale  Vitals are within treatment parameters.  Labs reviewed: and are within treatment parameters.  Per physician team, patient is ready for treatment and there are NO modifications to the treatment plan.  

## 2023-01-12 NOTE — Progress Notes (Signed)
HEMATOLOGY/ONCOLOGY CLINIC NOTE  Date of Service: 01/12/23  Patient Care Team: Ellyn Hack, MD as PCP - General (Family Medicine)   CHIEF COMPLAINTS/PURPOSE OF CONSULTATION:  Follow-up for continued evaluation and management of metastatic lung adenocarcinoma  HISTORY OF PRESENTING ILLNESS:  Please see previous notes for details on initial presentation.  Current Treatment:  Atezolizumab maintenance   INTERVAL HISTORY:  [facility]. Is a 57 y.o. male here for continued evaluation and management of his metastatic lung adenocarcinoma. Patient was last seen by me on 10/20/2022 and complained of red bumps on his bilateral arms and legs and occasional pain at the hernia surgery site.   Today, he reports that he developed a rash with generalized hives throughout his body after consuming tuna yesterday. He has previously consumed tuna with no major reaction. He denies any recent fishing or having any pets. Patient denies any fever or unusual food consumption. He denies using any new laundry detergents or cleaning supplies. He notes that he does generally breakout is hives once in a while and that mosquito bites generally causes swelling.   He has tolerated his treatment well with no new or severe toxicities.   Patient denies any hemorrhoidal bleeding or abdominal pain at this time. He reports left-sided neck pain since last week. Patient notes endorsing mild feet swelling which has resolved at this time.   Patient reports that he is considering on moving to a new state soon.   MEDICAL HISTORY:  Past Medical History:  Diagnosis Date   Anxiety    Bipolar disorder (HCC)    Blood in stool    Cancer (HCC)    Chronic bronchitis with emphysema    pt denies this   GERD (gastroesophageal reflux disease)    Lung cancer (HCC) dx'd 01/2019   Peripheral vascular disease (HCC)    blood clot in neck Sept 12, 2020    SURGICAL HISTORY: Past Surgical History:  Procedure  Laterality Date   APPENDECTOMY     teenager   INGUINAL HERNIA REPAIR Right 2016, 2017   [facility], [facility]-removed mesh   IR IMAGING GUIDED PORT INSERTION  04/26/2019   TOOTH EXTRACTION N/A 03/14/2020   Procedure: DENTAL RESTORATION/EXTRACTIONS;  Surgeon: Ocie Doyne, DDS;  Location: [facility] OR;  Service: Oral Surgery;  Laterality: N/A;    SOCIAL HISTORY: Social History   Socioeconomic History   Marital status: Single    Spouse name: Not on file   Number of children: 1   Years of education: GED   Highest education level: Some college, no degree  Occupational History    Comment: fork lift driver  Tobacco Use   Smoking status: Former    Types: Cigars    Quit date: 01/20/2019    Years since quitting: 3.9   Smokeless tobacco: Never   Tobacco comments:    smokes 3 black and mild cigars per day  Vaping Use   Vaping status: Never Used  Substance and Sexual Activity   Alcohol use: Not Currently   Drug use: No   Sexual activity: Yes  Other Topics Concern   Not on file  Social History Narrative   Lives alone   Caffeine- coffee, 20 oz daily, tea occas   Social Determinants of Health   Financial Resource Strain: Low Risk  (09/29/2020)   Overall Financial Resource Strain (CARDIA)    Difficulty of Paying Living Expenses: Not very hard  Food Insecurity: No Food Insecurity (09/29/2020)  Hunger Vital Sign    Worried About Running Out of Food in the Last Year: Never true    Ran Out of Food in the Last Year: Never true  Transportation Needs: No Transportation Needs (09/29/2020)   PRAPARE - Administrator, Civil Service (Medical): No    Lack of Transportation (Non-Medical): No  Physical Activity: Sufficiently Active (09/29/2020)   Exercise Vital Sign    Days of Exercise per Week: 3 days    Minutes of Exercise per Session: 150+ min  Stress: No Stress Concern Present (09/29/2020)   [employer] of Occupational Health - Occupational Stress Questionnaire     Feeling of Stress : Not at all  Social Connections: Unknown (03/01/2022)   Received from [facility]   Social Network    Social Network: Not on file  Intimate Partner Violence: Unknown (03/01/2022)   Received from [facility]   HITS    Physically Hurt: Not on file    Insult or Talk Down To: Not on file    Threaten Physical Harm: Not on file    Scream or Curse: Not on file    FAMILY HISTORY: Family History  Problem Relation Age of Onset   Prostate cancer Father    Colon cancer Neg Hx    Stomach cancer Neg Hx    Esophageal cancer Neg Hx     ALLERGIES:  is allergic to atorvastatin calcium.  MEDICATIONS:  Current Outpatient Medications  Medication Sig Dispense Refill   apixaban (ELIQUIS) 2.5 MG TABS tablet Take 1 tablet (2.5 mg total) by mouth 2 (two) times daily. 60 tablet 5   Cyanocobalamin (VITAMIN B 12 PO) Take 2,500 mcg by mouth daily.     esomeprazole (NEXIUM) 40 MG capsule TAKE 1 CAPSULE(40 MG) BY MOUTH DAILY 90 capsule 2   lactose free nutrition (BOOST PLUS) LIQD Drink one carton (237 ml) Boost Plus/equivalent three times daily in between meals     lidocaine-prilocaine (EMLA) cream Apply 1 Application topically as needed. 30 g 0   LORazepam (ATIVAN) 0.5 MG tablet Take 1 tablet (0.5 mg total) by mouth every 8 (eight) hours. (Patient taking differently: Take 0.5 mg by mouth every 8 (eight) hours as needed.) 30 tablet 0   ondansetron (ZOFRAN) 4 MG tablet Take 1 tablet (4 mg total) by mouth every 8 (eight) hours as needed for nausea or vomiting. 30 tablet 1   propranolol (INDERAL) 20 MG tablet TAKE 1 TABLET(20 MG) BY MOUTH TWICE DAILY 60 tablet 5   sildenafil (VIAGRA) 50 MG tablet Take 1 tablet (50 mg total) by mouth daily as needed for erectile dysfunction. Take 30 mins to 4 hours prior to sexual activity 30 tablet 1   No current facility-administered medications for this visit.   REVIEW OF SYSTEMS:    10 Point review of Systems was done is negative except as noted  above.   PHYSICAL EXAMINATION: ECOG FS:1 - Symptomatic but completely ambulatory .BP 112/78   Pulse 81   Temp 97.9 F (36.6 C)   Resp 20   Wt 131 lb 1.6 oz (59.5 kg)   SpO2 94%   BMI 18.28 kg/m   Wt Readings from Last 3 Encounters:  12/01/22 132 lb 9.6 oz (60.1 kg)  11/25/22 128 lb (58.1 kg)  11/10/22 131 lb 4 oz (59.5 kg)   Body mass index is 18.28 kg/m.    GENERAL:alert, in no acute distress and comfortable SKIN: no acute rashes, no significant lesions EYES: conjunctiva are pink and  non-injected, sclera anicteric OROPHARYNX: MMM, no exudates, no oropharyngeal erythema or ulceration NECK: supple, no JVD LYMPH:  no palpable lymphadenopathy in the cervical, axillary or inguinal regions LUNGS: clear to auscultation b/l with normal respiratory effort HEART: regular rate & rhythm ABDOMEN:  normoactive bowel sounds , non tender, not distended. Extremity: no pedal edema PSYCH: alert & oriented x 3 with fluent speech NEURO: no focal motor/sensory deficits   LABORATORY DATA:  I have reviewed the data as listed    Latest Ref Rng & Units 01/12/2023    9:40 AM 12/23/2022    9:05 AM 12/09/2022    3:05 PM  CBC  WBC 4.0 - 10.5 K/uL 4.8  4.9  5.5   Hemoglobin 13.0 - 17.0 g/dL 16.1  09.6  04.5   Hematocrit 39.0 - 52.0 % 39.0  42.3  40.4   Platelets 150 - 400 K/uL 274  299  311       Latest Ref Rng & Units 01/12/2023    9:40 AM 12/23/2022    9:05 AM 12/09/2022    3:05 PM  CMP  Glucose 70 - 99 mg/dL 95  409  97   BUN 6 - 20 mg/dL 9  8  11    Creatinine 0.61 - 1.24 mg/dL 8.11  9.14  7.82   Sodium 135 - 145 mmol/L 141  138  136   Potassium 3.5 - 5.1 mmol/L 4.0  4.2  3.9   Chloride 98 - 111 mmol/L 106  105  104   CO2 22 - 32 mmol/L 29  29  26    Calcium 8.9 - 10.3 mg/dL 9.3  9.1  9.3   Total Protein 6.5 - 8.1 g/dL 6.9  6.9  7.0   Total Bilirubin 0.3 - 1.2 mg/dL 0.4  0.3  0.4   Alkaline Phos 38 - 126 U/L 106  107  98   AST 15 - 41 U/L 14  14  13    ALT 0 - 44 U/L 12  12  9     Lab  Results  Component Value Date   IRON 16 (L) 05/05/2022   TIBC 448 05/05/2022   FERRITIN 52 10/20/2022    01/22/2019 [facility]: Tumor Mutational Burden     01/22/2019 PD-L1 Immunohistochemistry Analysis    01/22/2019 Soft Tissue Needle Core Biopsy Surgical Pathology     RADIOGRAPHIC STUDIES: I have personally reviewed the radiological images as listed and agreed with the findings in the report. No results found.   ASSESSMENT & PLAN:   This is a 57 year old male with   1. Metastatic Poorly differentiated lung adenocarcinoma Presented with Large neck mass, mediastinal mass, renal mass, and questionable bone lesions  no brain mets on MRI brain 01/22/2019 PD-L1 Immunohistochemistry Analysis which revealed "Tumor Proportion Score (TPS) 50%" 05/16/2019 PET/CT ([id number]) revealed "Radiation changes in the right hemithorax, as above. Improving mediastinal nodal metastases. Prior bulky right cervical metastases have resolved. No evidence of metastatic disease in the abdomen/pelvis." 07/19/2019 Esophagus Scan ([id number]) revealed "Mild stricture at the C6-7 level likely related to prior esophageal surgery. Barium tablet was slow to pass through this area but did pass through after 1 minutes. Hiatal hernia with moderate gastroesophageal reflux. Moderate stricture above the hiatal hernia likely due to reflux. Barium tablet did not pass this area. There are changes of esophagitis which are likely due to reflux and possibly radiation as well."  10/10/19 of  CT Chest W Contrast ([id number])- no evidence of lung cancer progression at this time. He  did have a PET CT scan on 04/13/2021 which was reviewed on the previous visit by Karena Addison.  This showed some borderline FDG avid right paratracheal right hilar and azygoesophageal lymph nodes that were suspicious for possible nodal recurrence however the patient notes that he did have a bad upper respiratory tract infection around the time that he had  his PET CT scan and therefore reactive adenopathy is a possibility as well.    2. 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 Symptomatic hemorrhoids-currently no significant bleeding but have caused some mild iron deficiency anemia.  5. Normocytic anemia -Likely due to underlying malignancy + Iron deficiency due to ongoing bleeding from hemorrhoids   6. S/pThrombocytosis -- likely due to paraneoplastic effect of tumor and reactive due to inflammation and tissue inflammation from RT.-- now resolved.   7. Nicotine dependence -has quit since cancer diagnosis  8. Iron deficiency - likely due to blood loss from hemorrhoids  9.  Erectile dysfunction-having partial benefits with using Viagra.  PLAN:   -Discussed lab results on 01/12/23 in detail with patient. CBC showed WBC of 4.8K, hemoglobin of 13.5, and platelets of 274K. -CMP normal -TSH pending -Patient has no lab or clinical evidence of lung cancer recurrence/progression at this time. -patient has no notable toxicities with immunotherapy at this time -CT abdomen scan with GI Dr. Leonides Schanz shows stable nodule in kidney with no change in size. Nodule in adrenal gland has decreased in size.  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 Patient would like to continue to monitor at this time. -continue Atezolizumab immunotherapy -continue Eliquis 2.5mg  BID today. -advised patient to inform us if he is planning to move to a new state to allow for any necessary documents to be sent and get logistics in place  -continue to follow with neurologist for optimal care of his eseential tremors -patient's hemorrhoidal bleeding has resolved  FOLLOW UP: -Per integrated scheduling continue Atezolizumab -Next MD visit in 6 weeks  The total  time spent in the appointment was 30 minutes* .  All of the patient's questions were answered with apparent satisfaction. The patient knows to call the clinic with any problems, questions or concerns.   [name] MD MS AAHIVMS [facility] Hematology/Oncology Physician [facility]  .*Total Encounter Time as defined by the Centers for Medicare and Medicaid Services includes, in addition to the face-to-face time of a patient visit (documented in the note above) non-face-to-face time: obtaining and reviewing outside history, ordering and reviewing medications, tests or procedures, care coordination (communications with other health care professionals or caregivers) and documentation in the medical record.    I,Mitra Faeizi,acting as a Neurosurgeon for [name], MD.,have documented all relevant documentation on the behalf of [name], MD,as directed by  [name], MD while in the presence of [name], MD.  .I have reviewed the above documentation for accuracy and completeness, and I agree with the above. [name] MD

## 2023-01-15 ENCOUNTER — Other Ambulatory Visit: Payer: Self-pay

## 2023-01-17 ENCOUNTER — Encounter: Payer: Self-pay | Admitting: Hematology

## 2023-01-17 ENCOUNTER — Encounter: Payer: Self-pay | Admitting: Physician Assistant

## 2023-01-31 ENCOUNTER — Other Ambulatory Visit: Payer: Self-pay

## 2023-01-31 DIAGNOSIS — C3491 Malignant neoplasm of unspecified part of right bronchus or lung: Secondary | ICD-10-CM

## 2023-01-31 MED ORDER — BOOST PO LIQD: 237.0000 mL | Freq: Two times a day (BID) | ORAL | 0 refills | Status: DC

## 2023-02-02 ENCOUNTER — Inpatient Hospital Stay: Payer: Medicare HMO | Admitting: Hematology

## 2023-02-02 ENCOUNTER — Inpatient Hospital Stay: Payer: Medicare HMO

## 2023-02-02 VITALS — BP 125/70 | HR 60 | Temp 98.0°F | Resp 20 | Wt 132.0 lb

## 2023-02-02 DIAGNOSIS — Z5112 Encounter for antineoplastic immunotherapy: Secondary | ICD-10-CM | POA: Diagnosis not present

## 2023-02-02 DIAGNOSIS — Z95828 Presence of other vascular implants and grafts: Secondary | ICD-10-CM

## 2023-02-02 DIAGNOSIS — Z7189 Other specified counseling: Secondary | ICD-10-CM

## 2023-02-02 DIAGNOSIS — C3491 Malignant neoplasm of unspecified part of right bronchus or lung: Secondary | ICD-10-CM

## 2023-02-02 LAB — CBC WITH DIFFERENTIAL (CANCER CENTER ONLY)
Abs Immature Granulocytes: 0.01 10*3/uL (ref 0.00–0.07)
Basophils Absolute: 0 10*3/uL (ref 0.0–0.1)
Basophils Relative: 1 %
Eosinophils Absolute: 0 10*3/uL (ref 0.0–0.5)
Eosinophils Relative: 1 %
HCT: 39.9 % (ref 39.0–52.0)
Hemoglobin: 13.3 g/dL (ref 13.0–17.0)
Immature Granulocytes: 0 %
Lymphocytes Relative: 24 %
Lymphs Abs: 1.1 10*3/uL (ref 0.7–4.0)
MCH: 29.8 pg (ref 26.0–34.0)
MCHC: 33.3 g/dL (ref 30.0–36.0)
MCV: 89.5 fL (ref 80.0–100.0)
Monocytes Absolute: 0.7 10*3/uL (ref 0.1–1.0)
Monocytes Relative: 16 %
Neutro Abs: 2.6 10*3/uL (ref 1.7–7.7)
Neutrophils Relative %: 58 %
Platelet Count: 277 10*3/uL (ref 150–400)
RBC: 4.46 MIL/uL (ref 4.22–5.81)
RDW: 13.5 % (ref 11.5–15.5)
WBC Count: 4.5 10*3/uL (ref 4.0–10.5)
nRBC: 0 % (ref 0.0–0.2)

## 2023-02-02 LAB — CMP (CANCER CENTER ONLY)
ALT: 10 U/L (ref 0–44)
AST: 13 U/L — ABNORMAL LOW (ref 15–41)
Albumin: 4 g/dL (ref 3.5–5.0)
Alkaline Phosphatase: 105 U/L (ref 38–126)
Anion gap: 4 — ABNORMAL LOW (ref 5–15)
BUN: 9 mg/dL (ref 6–20)
CO2: 29 mmol/L (ref 22–32)
Calcium: 9.4 mg/dL (ref 8.9–10.3)
Chloride: 105 mmol/L (ref 98–111)
Creatinine: 0.83 mg/dL (ref 0.61–1.24)
GFR, Estimated: >60 mL/min (ref >60)
Glucose, Bld: 92 mg/dL (ref 70–99)
Potassium: 4 mmol/L (ref 3.5–5.1)
Sodium: 138 mmol/L (ref 135–145)
Total Bilirubin: 0.4 mg/dL (ref 0.3–1.2)
Total Protein: 6.8 g/dL (ref 6.5–8.1)

## 2023-02-02 LAB — TSH: TSH: 1.443 u[IU]/mL (ref 0.350–4.500)

## 2023-02-02 MED ORDER — DIPHENHYDRAMINE HCL 25 MG PO CAPS
25.0000 mg | ORAL_CAPSULE | Freq: Once | ORAL | Status: AC
  Administered 2023-02-02: 25 mg via ORAL
  Filled 2023-02-02: qty 1

## 2023-02-02 MED ORDER — SODIUM CHLORIDE 0.9 % IV SOLN
1200.0000 mg | Freq: Once | INTRAVENOUS | Status: AC
  Administered 2023-02-02: 1200 mg via INTRAVENOUS
  Filled 2023-02-02: qty 20

## 2023-02-02 MED ORDER — SODIUM CHLORIDE 0.9 % IV SOLN: Freq: Once | INTRAVENOUS | Status: AC

## 2023-02-02 MED ORDER — ACETAMINOPHEN 325 MG PO TABS
650.0000 mg | ORAL_TABLET | Freq: Once | ORAL | Status: AC
  Administered 2023-02-02: 650 mg via ORAL
  Filled 2023-02-02: qty 2

## 2023-02-02 MED ORDER — SODIUM CHLORIDE 0.9% FLUSH
10.0000 mL | INTRAVENOUS | Status: DC | PRN
  Administered 2023-02-02: 10 mL

## 2023-02-02 MED ORDER — FAMOTIDINE 20 MG PO TABS
20.0000 mg | ORAL_TABLET | Freq: Once | ORAL | Status: AC
  Administered 2023-02-02: 20 mg via ORAL
  Filled 2023-02-02: qty 1

## 2023-02-02 MED ORDER — HEPARIN SOD (PORK) LOCK FLUSH 100 UNIT/ML IV SOLN
500.0000 [IU] | Freq: Once | INTRAVENOUS | Status: AC | PRN
  Administered 2023-02-02: 500 [IU]

## 2023-02-06 NOTE — Progress Notes (Unsigned)
02/06/2023 [facility] [id number] Mar 14, 1966   Chief Complaint:  History of Present Illness: [facility]. [facility] is a 57 year old male with a past medial history of anxiety, bipolar disorder, SVC syndrome, right internal jugular DVT on Eliquis, metastatic lung adenocarcinoma, IDA, gastric AVMs, GERD and Barrett's esophagus. He is known by Dr. Leonides Schanz.   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  No current issues.   past 6 months  Boost Kale nurse  [place]  1/2 pint   No heartburn  BM Q 2 days   Occ nausea ondansetron, heartburn early am      Latest Ref Rng & Units 02/02/2023   11:56 AM 01/12/2023    9:40 AM 12/23/2022    9:05 AM  CBC  WBC 4.0 - 10.5 K/uL 4.5  4.8  4.9   Hemoglobin 13.0 - 17.0 g/dL 52.8  41.3  24.4   Hematocrit 39.0 - 52.0 % 39.9  39.0  42.3   Platelets 150 - 400 K/uL 277  274  299        Latest Ref Rng & Units 02/02/2023   11:56 AM 01/12/2023    9:40 AM 12/23/2022    9:05 AM  CMP  Glucose 70 - 99 mg/dL 92  95  010   BUN 6 - 20 mg/dL 9  9  8    Creatinine 0.61 - 1.24 mg/dL 2.72  5.36  6.44   Sodium 135 - 145 mmol/L 138  141  138   Potassium 3.5 - 5.1 mmol/L 4.0  4.0  4.2   Chloride 98 - 111 mmol/L 105  106  105   CO2 22 - 32 mmol/L 29  29  29    Calcium 8.9 - 10.3 mg/dL 9.4  9.3  9.1   Total Protein 6.5 - 8.1 g/dL 6.8  6.9  6.9   Total Bilirubin 0.3 - 1.2 mg/dL 0.4  0.4  0.3   Alkaline Phos 38 - 126 U/L 105  106  107   AST 15 - 41 U/L 13  14  14    ALT 0 - 44 U/L 10  12  12      EGD 11/25/2022 by Dr. Leonides Schanz: - Benign-appearing esophageal stenosis. Dilated. Biopsied.  - Hiatal hernia.  - Salmon-colored mucosa classified as Barrett's stage C4-M4 per Prague criteria. Biopsied.  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  - Gastritis. Biopsied.  - Normal examined duodenum. Biopsied. - Recall EGD 3 years   Colonoscopy 11/25/2022: - The examined portion of the ileum was normal. - One 4 mm polyp in  the transverse colon, removed with a cold snare. Resected and retrieved.  - Non-bleeding internal hemorrhoids.  - Recall colonoscopy 10 years   1. Surgical [P], duodenal biopsies DUODENAL MUCOSA WITH NORMAL VILLOUS ARCHITECTURE. NO VILLOUS ATROPHY OR INCREASED INTRAEPITHELIAL LYMPHOCYTES. 2. Surgical [P], gastric biopsies ANTRAL AND OXYNTIC MUCOSA WITH CHRONIC INACTIVE GASTRITIS NO H. PYLORI ORGANISMS SEEN (EVALUATED BY IMMUNOSTAIN) 3. Surgical [P], 40-42cm from incisors-salmon colored mucosa REACTIVE SQUAMOCOLUMNAR MUCOSA WITH INTESTINAL METAPLASIA, CONSISTENT WITH BARRETT'S ESOPHAGUS NO EVIDENCE OF DYSPLASIA OR MALIGNANCY 4. Surgical [P], 38-40cm from incisors-salmon colored mucosa REACTIVE SQUAMOCOLUMNAR MUCOSA WITH INTESTINAL METAPLASIA, CONSISTENT WITH BARRET'S ESOPHAGUS NO EVIDENCE OF DYSPLASIA OR MALIGNANCY 5. Surgical [P], esophageal stricture biopsies SQUAMOUS MUCOSA WITH NO SIGNIFICANT PATHOLOGIC CHANGE NO EVIDENCE OF DYSPLASIA OR MALIGNANCY 6. Surgical [P], colon, transverse, polyp (1) BENIGN MUCOSAL PSEUDOPOLYP. MULTIPLE ADDITIONAL LEVELS EXAMINED.   Screening colonoscopy  07/09/2019: - Internal hemorrhoids otherwise normal  - EGD for reflux 07/09/2019: - LA class D reflux esophagitis,  - Benign-appearing stenosis located 40 cm from the incisors that could not be traversed due to resistance - A small rent developed with attempted endoscopic evaluation.   Abdominal imaging:  CTAP with contrast 11/22/2022: FINDINGS: Lower chest: Stable minimal scarring at both lung bases.   Hepatobiliary: Stable mild focal fat deposition adjacent to the falciform ligament. Normal-appearing gallbladder.   Pancreas: Unremarkable. No pancreatic ductal dilatation or surrounding inflammatory changes.   Spleen: Normal-sized and shape. Multiple small calcified granulomata.   Adrenals/Urinary Tract: The previously demonstrated 1.5 x 0.8 cm right adrenal nodule measures 1.5 x 1.1 cm on  image number 20/2.   Unremarkable left adrenal gland.   The previously demonstrated 1.6 x 1.4 cm heterogeneous right renal mass measures 1.5 x 1.5 cm on image number 23/2.   Unremarkable left kidney, ureters and urinary bladder.   Stomach/Bowel: Unremarkable stomach, small bowel and colon. Surgically absent appendix. The previously demonstrated proximal small bowel wall thickening is no longer seen.   Vascular/Lymphatic: No significant vascular findings are present. No enlarged abdominal or pelvic lymph nodes.   Reproductive: Stable moderately enlarged prostate gland.   Other: No abdominal wall hernia or abnormality. No abdominopelvic ascites.   Musculoskeletal: Stable left iliac bone island. Minimal lumbar and lower thoracic spine degenerative changes.   IMPRESSION: 1. No acute abnormality. 2. Stable 1.5 cm right renal mass, suspicious for renal cell carcinoma. 3. Stable 1.5 cm right adrenal nodule, indeterminate. 4. Stable moderately enlarged prostate gland. - Barium swallow 07/19/2019 showed a mild stricture at C6-7, successful but slow passage of a barium tablet through this area, hiatal hernia with moderate reflux, and a moderate stricture above the hiatal hernia  CTAP 11/08/2020 showed generalized mild smooth wall thickening throughout the thoracic esophagus unchanged.  Right paratracheal soft tissue enlargement.  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  No new or progressive metastatic disease.    Past Medical History:  Diagnosis Date   Anxiety    Bipolar disorder (HCC)    Blood in stool    Cancer (HCC)    Chronic bronchitis with emphysema    pt denies this   GERD (gastroesophageal reflux disease)    Lung cancer (HCC) dx'd 01/2019   Peripheral vascular disease (HCC)    blood clot in neck Sept 12, 2020   Past Surgical History:  Procedure Laterality Date   APPENDECTOMY     teenager   INGUINAL HERNIA REPAIR Right 2016, 2017    [facility], [facility]-removed mesh   IR IMAGING GUIDED PORT INSERTION  04/26/2019   TOOTH EXTRACTION N/A 03/14/2020   Procedure: DENTAL RESTORATION/EXTRACTIONS;  Surgeon: Ocie Doyne, DDS;  Location: [facility] OR;  Service: Oral Surgery;  Laterality: N/A;      Current Medications, Allergies, Past Medical History, Past Surgical History, Family History and Social History were reviewed in [employer] record.   Review of Systems:   Constitutional: Negative for fever, sweats, chills or weight loss.  Respiratory: Negative for shortness of breath.   Cardiovascular: Negative for chest pain, palpitations and leg swelling.  Gastrointestinal: See HPI.  Musculoskeletal: Negative for back pain or muscle aches.  Neurological: Negative for dizziness, headaches or paresthesias.    Physical Exam: There were no vitals taken for this visit. General: in no acute distress. Head: Normocephalic and atraumatic. Eyes: No scleral icterus. Conjunctiva pink . Ears: Normal auditory acuity. Mouth:  Dentition intact. No ulcers or lesions.  Lungs: Clear throughout to auscultation. Heart: Regular rate and rhythm, no murmur. Abdomen: Soft, nontender and nondistended. No masses or hepatomegaly. Normal bowel sounds x 4 quadrants.  Rectal: *** Musculoskeletal: Symmetrical with no gross deformities. Extremities: No edema. Neurological: Alert oriented x 4. No focal deficits.  Psychological: Alert and cooperative. Normal mood and affect  Assessment and Recommendations:   Metastatic Poorly differentiated lung adenocarcinoma on Atezolizumab immunotherapy, followed by oncologist Dr. [name]

## 2023-02-07 ENCOUNTER — Ambulatory Visit (INDEPENDENT_AMBULATORY_CARE_PROVIDER_SITE_OTHER): Payer: Medicare HMO | Admitting: Nurse Practitioner

## 2023-02-07 ENCOUNTER — Encounter: Payer: Self-pay | Admitting: Nurse Practitioner

## 2023-02-07 VITALS — BP 110/68 | HR 63 | Ht 71.0 in | Wt 131.0 lb

## 2023-02-07 DIAGNOSIS — K2270 Barrett's esophagus without dysplasia: Secondary | ICD-10-CM | POA: Diagnosis not present

## 2023-02-07 DIAGNOSIS — K219 Gastro-esophageal reflux disease without esophagitis: Secondary | ICD-10-CM

## 2023-02-07 NOTE — Patient Instructions (Addendum)
Pepcid 20mg  one tab by mouth at bedtime as needed for breakthrough heartburn symptoms   Continue Esomeprazole 40mg  once daily to be taken 30 minutes before breakfast   Benefiber- 1 tablespoon daily   Miralax- 1 capful mixed I 8 ounces of water at bedtime  Due to recent changes in healthcare laws, you may see the results of your imaging and laboratory studies on MyChart before your provider has had a chance to review them.  We understand that in some cases there may be results that are confusing or concerning to you. Not all laboratory results come back in the same time frame and the provider may be waiting for multiple results in order to interpret others.  Please give [place] 48 hours in order for your provider to thoroughly review all the results before contacting the office for clarification of your results.   Thank you for trusting me with your gastrointestinal care!   Alcide Evener, CRNP

## 2023-02-08 ENCOUNTER — Encounter: Payer: Self-pay | Admitting: Nurse Practitioner

## 2023-02-08 NOTE — Progress Notes (Signed)
I agree with the assessment and plan as outlined by David Berry. 

## 2023-02-23 ENCOUNTER — Inpatient Hospital Stay: Payer: Medicare HMO | Attending: Physician Assistant

## 2023-02-23 ENCOUNTER — Inpatient Hospital Stay: Payer: Medicare HMO | Attending: Physician Assistant | Admitting: Physician Assistant

## 2023-02-23 ENCOUNTER — Inpatient Hospital Stay: Payer: Medicare HMO

## 2023-02-23 VITALS — BP 105/75 | HR 80 | Temp 97.9°F | Resp 18 | Ht 71.0 in | Wt 133.1 lb

## 2023-02-23 VITALS — BP 96/76 | HR 70

## 2023-02-23 DIAGNOSIS — Z7189 Other specified counseling: Secondary | ICD-10-CM

## 2023-02-23 DIAGNOSIS — C3491 Malignant neoplasm of unspecified part of right bronchus or lung: Secondary | ICD-10-CM

## 2023-02-23 DIAGNOSIS — Z87891 Personal history of nicotine dependence: Secondary | ICD-10-CM | POA: Diagnosis not present

## 2023-02-23 DIAGNOSIS — C771 Secondary and unspecified malignant neoplasm of intrathoracic lymph nodes: Secondary | ICD-10-CM | POA: Diagnosis present

## 2023-02-23 DIAGNOSIS — Z86718 Personal history of other venous thrombosis and embolism: Secondary | ICD-10-CM | POA: Diagnosis not present

## 2023-02-23 DIAGNOSIS — D509 Iron deficiency anemia, unspecified: Secondary | ICD-10-CM | POA: Insufficient documentation

## 2023-02-23 DIAGNOSIS — Z95828 Presence of other vascular implants and grafts: Secondary | ICD-10-CM

## 2023-02-23 DIAGNOSIS — Z5112 Encounter for antineoplastic immunotherapy: Secondary | ICD-10-CM | POA: Insufficient documentation

## 2023-02-23 DIAGNOSIS — Z7901 Long term (current) use of anticoagulants: Secondary | ICD-10-CM | POA: Diagnosis not present

## 2023-02-23 DIAGNOSIS — Z79899 Other long term (current) drug therapy: Secondary | ICD-10-CM | POA: Insufficient documentation

## 2023-02-23 LAB — CMP (CANCER CENTER ONLY)
ALT: 9 U/L (ref 0–44)
AST: 14 U/L — ABNORMAL LOW (ref 15–41)
Albumin: 4 g/dL (ref 3.5–5.0)
Alkaline Phosphatase: 99 U/L (ref 38–126)
Anion gap: 6 (ref 5–15)
BUN: 7 mg/dL (ref 6–20)
CO2: 28 mmol/L (ref 22–32)
Calcium: 9.5 mg/dL (ref 8.9–10.3)
Chloride: 104 mmol/L (ref 98–111)
Creatinine: 0.83 mg/dL (ref 0.61–1.24)
GFR, Estimated: >60 mL/min (ref >60)
Glucose, Bld: 104 mg/dL — ABNORMAL HIGH (ref 70–99)
Potassium: 4.3 mmol/L (ref 3.5–5.1)
Sodium: 138 mmol/L (ref 135–145)
Total Bilirubin: 0.4 mg/dL (ref 0.3–1.2)
Total Protein: 6.8 g/dL (ref 6.5–8.1)

## 2023-02-23 LAB — CBC WITH DIFFERENTIAL (CANCER CENTER ONLY)
Abs Immature Granulocytes: 0.01 10*3/uL (ref 0.00–0.07)
Basophils Absolute: 0 10*3/uL (ref 0.0–0.1)
Basophils Relative: 1 %
Eosinophils Absolute: 0.1 10*3/uL (ref 0.0–0.5)
Eosinophils Relative: 1 %
HCT: 39.9 % (ref 39.0–52.0)
Hemoglobin: 13.1 g/dL (ref 13.0–17.0)
Immature Granulocytes: 0 %
Lymphocytes Relative: 24 %
Lymphs Abs: 1.1 10*3/uL (ref 0.7–4.0)
MCH: 29.6 pg (ref 26.0–34.0)
MCHC: 32.8 g/dL (ref 30.0–36.0)
MCV: 90.1 fL (ref 80.0–100.0)
Monocytes Absolute: 0.7 10*3/uL (ref 0.1–1.0)
Monocytes Relative: 16 %
Neutro Abs: 2.6 10*3/uL (ref 1.7–7.7)
Neutrophils Relative %: 58 %
Platelet Count: 274 10*3/uL (ref 150–400)
RBC: 4.43 MIL/uL (ref 4.22–5.81)
RDW: 13.2 % (ref 11.5–15.5)
WBC Count: 4.5 10*3/uL (ref 4.0–10.5)
nRBC: 0 % (ref 0.0–0.2)

## 2023-02-23 LAB — TSH: TSH: 3.15 u[IU]/mL (ref 0.350–4.500)

## 2023-02-23 MED ORDER — SODIUM CHLORIDE 0.9% FLUSH
10.0000 mL | INTRAVENOUS | Status: DC | PRN
  Administered 2023-02-23: 10 mL

## 2023-02-23 MED ORDER — SODIUM CHLORIDE 0.9 % IV SOLN
1200.0000 mg | Freq: Once | INTRAVENOUS | Status: AC
  Administered 2023-02-23: 1200 mg via INTRAVENOUS
  Filled 2023-02-23: qty 20

## 2023-02-23 MED ORDER — SODIUM CHLORIDE 0.9 % IV SOLN: Freq: Once | INTRAVENOUS | Status: AC

## 2023-02-23 MED ORDER — HEPARIN SOD (PORK) LOCK FLUSH 100 UNIT/ML IV SOLN
500.0000 [IU] | Freq: Once | INTRAVENOUS | Status: AC | PRN
  Administered 2023-02-23: 500 [IU]

## 2023-02-23 MED ORDER — DIPHENHYDRAMINE HCL 25 MG PO CAPS
25.0000 mg | ORAL_CAPSULE | Freq: Once | ORAL | Status: AC
  Administered 2023-02-23: 25 mg via ORAL
  Filled 2023-02-23: qty 1

## 2023-02-23 MED ORDER — ACETAMINOPHEN 325 MG PO TABS
650.0000 mg | ORAL_TABLET | Freq: Once | ORAL | Status: AC
  Administered 2023-02-23: 650 mg via ORAL
  Filled 2023-02-23: qty 2

## 2023-02-23 MED ORDER — FAMOTIDINE 20 MG PO TABS
20.0000 mg | ORAL_TABLET | Freq: Once | ORAL | Status: AC
  Administered 2023-02-23: 20 mg via ORAL
  Filled 2023-02-23: qty 1

## 2023-02-23 NOTE — Patient Instructions (Signed)
[facility]  Discharge Instructions: Thank you for choosing [facility] to provide your oncology and hematology care.   If you have a lab appointment with the Cancer Center, please go directly to the Cancer Center and check in at the registration area.   Wear comfortable clothing and clothing appropriate for easy access to any Portacath or PICC line.   We strive to give you quality time with your provider. You may need to reschedule your appointment if you arrive late (15 or more minutes).  Arriving late affects you and other patients whose appointments are after yours.  Also, if you miss three or more appointments without notifying the office, you may be dismissed from the clinic at the provider's discretion.      For prescription refill requests, have your pharmacy contact our office and allow 72 hours for refills to be completed.    Today you received the following chemotherapy and/or immunotherapy agents Tecentriq      To help prevent nausea and vomiting after your treatment, we encourage you to take your nausea medication as directed.  BELOW ARE SYMPTOMS THAT SHOULD BE REPORTED IMMEDIATELY: *FEVER GREATER THAN 100.4 F (38 C) OR HIGHER *CHILLS OR SWEATING *NAUSEA AND VOMITING THAT IS NOT CONTROLLED WITH YOUR NAUSEA MEDICATION *UNUSUAL SHORTNESS OF BREATH *UNUSUAL BRUISING OR BLEEDING *URINARY PROBLEMS (pain or burning when urinating, or frequent urination) *BOWEL PROBLEMS (unusual diarrhea, constipation, pain near the anus) TENDERNESS IN MOUTH AND THROAT WITH OR WITHOUT PRESENCE OF ULCERS (sore throat, sores in mouth, or a toothache) UNUSUAL RASH, SWELLING OR PAIN  UNUSUAL VAGINAL DISCHARGE OR ITCHING   Items with * indicate a potential emergency and should be followed up as soon as possible or go to the Emergency Department if any problems should occur.  Please show the CHEMOTHERAPY ALERT CARD or IMMUNOTHERAPY ALERT CARD at  check-in to the Emergency Department and triage nurse.  Should you have questions after your visit or need to cancel or reschedule your appointment, please contact [facility]  Dept: [phone]  and follow the prompts.  Office hours are 8:00 a.m. to 4:30 p.m. Monday - Friday. Please note that voicemails left after 4:00 p.m. may not be returned until the following business day.  We are closed weekends and major holidays. You have access to a nurse at all times for urgent questions. Please call the main number to the clinic Dept: [phone] and follow the prompts.   For any non-urgent questions, you may also contact your provider using MyChart. We now offer e-Visits for anyone 51 and older to request care online for non-urgent symptoms. For details visit [email or web address].   Also download the MyChart app! Go to the app store, search "MyChart", open the app, select , and log in with your MyChart username and password.

## 2023-02-23 NOTE — Progress Notes (Signed)
HEMATOLOGY/ONCOLOGY CLINIC NOTE  Date of Service: 02/23/23  Patient Care Team: Ellyn Hack, MD as PCP - General (Family Medicine)   CHIEF COMPLAINTS/PURPOSE OF CONSULTATION:  Follow-up for continued evaluation and management of metastatic lung adenocarcinoma  Current Treatment:  Atezolizumab maintenance   INTERVAL HISTORY:  David Berry. Is a 57 y.o. male here for continued evaluation and management of his metastatic lung adenocarcinoma. He was last seen by Dr. [name] on 01/12/2023. He is unaccompanied for this visit.   David Berry reports he is tolerating Atezolizumab therapy without any toxicities. He reports his energy and appetite are stable. He reports intermittent episodes of nausea that is well controlled. He denies vomiting episodes or abdominal pain. His bowel habits are regular without recurrent episodes of diarrhea or constipation. He reports occasional dizziness that is triggered by positional changes. He has a rash that presents on his arms, thighs and trunk. He is uncertain what triggers the rash but resolves on its own. 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 He denies fevers, chills, sweats, shortness of breath, chest pain or cough. He has no other complaints.   MEDICAL HISTORY:  Past Medical History:  Diagnosis Date   Anxiety    Bipolar disorder (HCC)    Blood in stool    Cancer (HCC)    Chronic bronchitis with emphysema (HCC)    pt denies this   GERD (gastroesophageal reflux disease)    Lung cancer (HCC) dx'd 01/2019   Peripheral vascular disease (HCC)    blood clot in neck Sept 12, 2020    SURGICAL HISTORY: Past Surgical History:  Procedure Laterality Date   APPENDECTOMY     teenager   INGUINAL HERNIA REPAIR Right 2016, 2017   [facility], 2018 [facility]-removed mesh   IR IMAGING GUIDED PORT INSERTION  04/26/2019   TOOTH EXTRACTION N/A 03/14/2020   Procedure: DENTAL  RESTORATION/EXTRACTIONS;  Surgeon: Ocie Doyne, DDS;  Location: [facility] OR;  Service: Oral Surgery;  Laterality: N/A;    SOCIAL HISTORY: Social History   Socioeconomic History   Marital status: Single    Spouse name: Not on file   Number of children: 1   Years of education: GED   Highest education level: Some college, no degree  Occupational History    Comment: fork lift driver  Tobacco Use   Smoking status: Former    Types: Cigars    Quit date: 01/20/2019    Years since quitting: 4.0   Smokeless tobacco: Never   Tobacco comments:    smokes 3 black and mild cigars per day  Vaping Use   Vaping status: Never Used  Substance and Sexual Activity   Alcohol use: Not Currently   Drug use: No   Sexual activity: Yes  Other Topics Concern   Not on file  Social History Narrative   Lives alone   Caffeine- coffee, 20 oz daily, tea occas   Social Determinants of Health   Financial Resource Strain: Low Risk  (09/29/2020)   Overall Financial Resource Strain (CARDIA)    Difficulty of Paying Living Expenses: Not very hard  Food Insecurity: No Food Insecurity (09/29/2020)   Hunger Vital Sign    Worried About Running Out of Food in the Last Year: Never true    Ran Out of Food in the Last Year: Never true  Transportation Needs: No Transportation Needs (09/29/2020)   PRAPARE - Transportation  Lack of Transportation (Medical): No    Lack of Transportation (Non-Medical): No  Physical Activity: Sufficiently Active (09/29/2020)   Exercise Vital Sign    Days of Exercise per Week: 3 days    Minutes of Exercise per Session: 150+ min  Stress: No Stress Concern Present (09/29/2020)   [employer] of Occupational Health - Occupational Stress Questionnaire    Feeling of Stress : Not at all  Social Connections: Unknown (03/01/2022)   [facility], [facility]   Social Network    Social Network: Not on file  Intimate Partner Violence: Unknown (03/01/2022)   Received from [facility], [facility]   HITS    Physically Hurt: Not on file    Insult or Talk Down To: Not on file    Threaten Physical Harm: Not on file    Scream or Curse: Not on file    FAMILY HISTORY: Family History  Problem Relation Age of Onset   Prostate cancer Father    Colon cancer Neg Hx    Stomach cancer Neg Hx    Esophageal cancer Neg Hx     ALLERGIES:  is allergic to atorvastatin calcium.  MEDICATIONS:  Current Outpatient Medications  Medication Sig Dispense Refill   apixaban (ELIQUIS) 2.5 MG TABS tablet Take 1 tablet (2.5 mg total) by mouth 2 (two) times daily. 60 tablet 5   Cyanocobalamin (VITAMIN B 12 PO) Take 2,500 mcg by mouth daily.     esomeprazole (NEXIUM) 40 MG capsule TAKE 1 CAPSULE(40 MG) BY MOUTH DAILY 90 capsule 2   lactose free nutrition (BOOST PLUS) LIQD Drink one carton (237 ml) Boost Plus/equivalent three times daily in between meals     lidocaine-prilocaine (EMLA) cream Apply 1 Application topically as needed. 30 g 0   ondansetron (ZOFRAN) 4 MG tablet Take 1 tablet (4 mg total) by mouth every 8 (eight) hours as needed for nausea or vomiting. 30 tablet 1   propranolol (INDERAL) 20 MG tablet TAKE 1 TABLET(20 MG) BY MOUTH TWICE DAILY 60 tablet 5   sildenafil (VIAGRA) 50 MG tablet Take 1 tablet (50 mg total) by mouth daily as needed for erectile dysfunction. Take 30 mins to 4 hours prior to sexual activity 30 tablet 1   No current facility-administered medications for this visit.   Facility-Administered Medications Ordered in Other Visits  Medication Dose Route Frequency Provider Last Rate Last Admin   sodium chloride flush (NS) 0.9 % injection 10 mL  10 mL Intracatheter PRN [name], MD   10 mL at 02/23/23 1532   REVIEW OF SYSTEMS:   10 Point review of Systems was done is negative except as noted above.  PHYSICAL EXAMINATION: ECOG FS:1 - Symptomatic but completely ambulatory  .BP 105/75 (BP Location: Left Arm, Patient Position: Sitting)   Pulse 80    Temp 97.9 F (36.6 C) (Tympanic)   Resp 18   Ht 5\' 11"  (1.803 m)   Wt 133 lb 1.6 oz (60.4 kg)   SpO2 99%   BMI 18.56 kg/m    Wt Readings from Last 3 Encounters:  02/23/23 133 lb 1.6 oz (60.4 kg)  02/07/23 131 lb (59.4 kg)  02/02/23 132 lb (59.9 kg)   Body mass index is 18.56 kg/m.    GENERAL:alert, in no acute distress and comfortable SKIN: no acute rashes, no significant lesions EYES: conjunctiva are pink and non-injected, sclera anicteric LYMPH:  no palpable lymphadenopathy in the cervical or supraclavicular regions LUNGS: clear to auscultation b/l with  normal respiratory effort HEART: regular rate & rhythm Extremity: no pedal edema PSYCH: alert & oriented x 3 with fluent speech NEURO: no focal motor/sensory deficits  LABORATORY DATA:  I have reviewed the data as listed    Latest Ref Rng & Units 02/23/2023    1:00 PM 02/02/2023   11:56 AM 01/12/2023    9:40 AM  CBC  WBC 4.0 - 10.5 K/uL 4.5  4.5  4.8   Hemoglobin 13.0 - 17.0 g/dL 16.1  09.6  04.5   Hematocrit 39.0 - 52.0 % 39.9  39.9  39.0   Platelets 150 - 400 K/uL 274  277  274       Latest Ref Rng & Units 02/23/2023    1:00 PM 02/02/2023   11:56 AM 01/12/2023    9:40 AM  CMP  Glucose 70 - 99 mg/dL 409  92  95   BUN 6 - 20 mg/dL 7  9  9    Creatinine 0.61 - 1.24 mg/dL 8.11  9.14  7.82   Sodium 135 - 145 mmol/L 138  138  141   Potassium 3.5 - 5.1 mmol/L 4.3  4.0  4.0   Chloride 98 - 111 mmol/L 104  105  106   CO2 22 - 32 mmol/L 28  29  29    Calcium 8.9 - 10.3 mg/dL 9.5  9.4  9.3   Total Protein 6.5 - 8.1 g/dL 6.8  6.8  6.9   Total Bilirubin 0.3 - 1.2 mg/dL 0.4  0.4  0.4   Alkaline Phos 38 - 126 U/L 99  105  106   AST 15 - 41 U/L 14  13  14    ALT 0 - 44 U/L 9  10  12     Lab Results  Component Value Date   IRON 16 (L) 05/05/2022   TIBC 448 05/05/2022   FERRITIN 52 10/20/2022    01/22/2019 Foundation One: Tumor Mutational Burden     01/22/2019 PD-L1 Immunohistochemistry Analysis    01/22/2019 Soft  Tissue Needle Core Biopsy Surgical Pathology     RADIOGRAPHIC STUDIES: I have personally reviewed the radiological images as listed and agreed with the findings in the report. No results found.   ASSESSMENT & PLAN:  Blaire Hodsdon. Is a 57 y.o. male who returns for a follow up for metastatic lung cancer.    1. Metastatic Poorly differentiated lung adenocarcinoma -Presented with Large neck mass, mediastinal mass, renal mass, and questionable bone lesions -No brain mets on MRI brain -01/22/2019 PD-L1 Immunohistochemistry Analysis which revealed "Tumor Proportion Score (TPS) 50%" -Started Atezolizumab on 03/21/2019. Added Carbo/taxol with Cycle 2 on 04/10/2019. After 6 cycles, transitioned to maintenance Atezolizumab q 21 days.  -Most recent CT CAP from 08/11/2022 showed no obvious progression of his cancer but some thickening of duodenum and jejunum   2. h/o  Impending SVC syndrome: --s/p pallaitive RT  3.h/o Acute DVT- Right IJ, Right Innominate Vein, and likely also the SVC --Related to malignancy and tobacco use --On long term Eliquis but decreased to 2.5 mg BID due to report of BRB with wiping after BM secondary to hemorrhoids.  4. Symptomatic hemorrhoids --Currently no significant bleeding but have caused some mild iron deficiency anemia.  5. Normocytic anemia--resolved -Likely due to underlying malignancy + Iron deficiency due to ongoing bleeding from hemorrhoids   6.Thrombocytosis--resolved --Likely due to paraneoplastic effect of tumor and reactive due to inflammation and tissue inflammation from RT.   7. Nicotine dependence -Has quit since  cancer diagnosis  8. Iron deficiency  -Likely due to blood loss from hemorrhoids -Last received IV venofer 300 mg x 2 doses from 09/16/2022-09/29/2022.  9.  Erectile dysfunction --Having partial benefits with using Viagra.  PLAN:  --Due to Atezolizumab treatment today.  --Labs from today were reviewed and adequate for  treatment. WBC 4.5, Hgb 13.1, Plt 274, Creatinine and LFTs  --No other prohibitive toxicities identified so proceed with atezolizumab without any dose modifications --Will obtain repeat CT CAP next month to evaluate for treatment response.  --Advised to try OTC antihistamine (Claritin or Benadryl) to help with rash which upon pictures is concerning for hypersensitivity reaction.   FOLLOW UP: Continue atezolizumab for integrated scheduling   All of the patient's questions were answered with apparent satisfaction. The patient knows to call the clinic with any problems, questions or concerns.  I have spent a total of 30 minutes minutes of face-to-face and non-face-to-face time, preparing to see the patient, performing a medically appropriate examination, counseling and educating the patient, ordering tests/procedures, referring and communicating with other health care professionals, documenting clinical information in the electronic health record, and care coordination.   Georga Kaufmann PA-C Dept of Hematology and Oncology [facility] Phone: [phone]

## 2023-02-25 ENCOUNTER — Other Ambulatory Visit: Payer: Self-pay

## 2023-03-04 ENCOUNTER — Other Ambulatory Visit: Payer: Medicare HMO

## 2023-03-15 ENCOUNTER — Inpatient Hospital Stay: Payer: Medicare HMO

## 2023-03-15 ENCOUNTER — Inpatient Hospital Stay: Payer: Medicare HMO | Admitting: Hematology

## 2023-03-16 ENCOUNTER — Inpatient Hospital Stay: Payer: Medicare HMO | Attending: Physician Assistant

## 2023-03-16 ENCOUNTER — Inpatient Hospital Stay: Payer: Medicare HMO

## 2023-03-16 ENCOUNTER — Inpatient Hospital Stay: Payer: Medicare HMO | Admitting: Hematology

## 2023-03-16 ENCOUNTER — Other Ambulatory Visit: Payer: Self-pay | Admitting: Urology

## 2023-03-16 VITALS — BP 97/62 | HR 70 | Temp 98.4°F | Resp 16 | Wt 131.8 lb

## 2023-03-16 DIAGNOSIS — Z95828 Presence of other vascular implants and grafts: Secondary | ICD-10-CM

## 2023-03-16 DIAGNOSIS — Z7189 Other specified counseling: Secondary | ICD-10-CM

## 2023-03-16 DIAGNOSIS — Z5112 Encounter for antineoplastic immunotherapy: Secondary | ICD-10-CM | POA: Insufficient documentation

## 2023-03-16 DIAGNOSIS — C771 Secondary and unspecified malignant neoplasm of intrathoracic lymph nodes: Secondary | ICD-10-CM | POA: Insufficient documentation

## 2023-03-16 DIAGNOSIS — D3001 Benign neoplasm of right kidney: Secondary | ICD-10-CM

## 2023-03-16 DIAGNOSIS — Z79899 Other long term (current) drug therapy: Secondary | ICD-10-CM | POA: Insufficient documentation

## 2023-03-16 DIAGNOSIS — C3491 Malignant neoplasm of unspecified part of right bronchus or lung: Secondary | ICD-10-CM

## 2023-03-16 LAB — CBC WITH DIFFERENTIAL (CANCER CENTER ONLY)
Abs Immature Granulocytes: 0.01 10*3/uL (ref 0.00–0.07)
Basophils Absolute: 0 10*3/uL (ref 0.0–0.1)
Basophils Relative: 0 %
Eosinophils Absolute: 0 10*3/uL (ref 0.0–0.5)
Eosinophils Relative: 1 %
HCT: 36.2 % — ABNORMAL LOW (ref 39.0–52.0)
Hemoglobin: 12 g/dL — ABNORMAL LOW (ref 13.0–17.0)
Immature Granulocytes: 0 %
Lymphocytes Relative: 21 %
Lymphs Abs: 0.9 10*3/uL (ref 0.7–4.0)
MCH: 29.7 pg (ref 26.0–34.0)
MCHC: 33.1 g/dL (ref 30.0–36.0)
MCV: 89.6 fL (ref 80.0–100.0)
Monocytes Absolute: 0.6 10*3/uL (ref 0.1–1.0)
Monocytes Relative: 13 %
Neutro Abs: 2.9 10*3/uL (ref 1.7–7.7)
Neutrophils Relative %: 65 %
Platelet Count: 274 10*3/uL (ref 150–400)
RBC: 4.04 MIL/uL — ABNORMAL LOW (ref 4.22–5.81)
RDW: 13.3 % (ref 11.5–15.5)
WBC Count: 4.5 10*3/uL (ref 4.0–10.5)
nRBC: 0 % (ref 0.0–0.2)

## 2023-03-16 LAB — CMP (CANCER CENTER ONLY)
ALT: 9 U/L (ref 0–44)
AST: 13 U/L — ABNORMAL LOW (ref 15–41)
Albumin: 3.9 g/dL (ref 3.5–5.0)
Alkaline Phosphatase: 103 U/L (ref 38–126)
Anion gap: 3 — ABNORMAL LOW (ref 5–15)
BUN: 8 mg/dL (ref 6–20)
CO2: 29 mmol/L (ref 22–32)
Calcium: 9.4 mg/dL (ref 8.9–10.3)
Chloride: 105 mmol/L (ref 98–111)
Creatinine: 0.81 mg/dL (ref 0.61–1.24)
GFR, Estimated: >60 mL/min (ref >60)
Glucose, Bld: 119 mg/dL — ABNORMAL HIGH (ref 70–99)
Potassium: 4.2 mmol/L (ref 3.5–5.1)
Sodium: 137 mmol/L (ref 135–145)
Total Bilirubin: 0.4 mg/dL (ref <1.2)
Total Protein: 6.6 g/dL (ref 6.5–8.1)

## 2023-03-16 LAB — TSH: TSH: 1.062 u[IU]/mL (ref 0.350–4.500)

## 2023-03-16 MED ORDER — ACETAMINOPHEN 325 MG PO TABS
650.0000 mg | ORAL_TABLET | Freq: Once | ORAL | Status: AC
  Administered 2023-03-16: 650 mg via ORAL
  Filled 2023-03-16: qty 2

## 2023-03-16 MED ORDER — SODIUM CHLORIDE 0.9% FLUSH
10.0000 mL | INTRAVENOUS | Status: DC | PRN
  Administered 2023-03-16: 10 mL

## 2023-03-16 MED ORDER — ATEZOLIZUMAB CHEMO INJECTION 1200 MG/20ML
1200.0000 mg | Freq: Once | INTRAVENOUS | Status: AC
  Administered 2023-03-16: 1200 mg via INTRAVENOUS
  Filled 2023-03-16: qty 20

## 2023-03-16 MED ORDER — DIPHENHYDRAMINE HCL 25 MG PO CAPS
25.0000 mg | ORAL_CAPSULE | Freq: Once | ORAL | Status: AC
  Administered 2023-03-16: 25 mg via ORAL
  Filled 2023-03-16: qty 1

## 2023-03-16 MED ORDER — HEPARIN SOD (PORK) LOCK FLUSH 100 UNIT/ML IV SOLN
500.0000 [IU] | Freq: Once | INTRAVENOUS | Status: AC | PRN
Start: 2023-03-16 — End: 2023-03-16
  Administered 2023-03-16: 500 [IU]

## 2023-03-16 MED ORDER — SODIUM CHLORIDE 0.9 % IV SOLN: Freq: Once | INTRAVENOUS | Status: AC

## 2023-03-16 MED ORDER — FAMOTIDINE 20 MG PO TABS
20.0000 mg | ORAL_TABLET | Freq: Once | ORAL | Status: AC
  Administered 2023-03-16: 20 mg via ORAL
  Filled 2023-03-16: qty 1

## 2023-03-17 ENCOUNTER — Other Ambulatory Visit: Payer: Self-pay

## 2023-03-24 ENCOUNTER — Other Ambulatory Visit: Payer: Self-pay

## 2023-03-28 ENCOUNTER — Ambulatory Visit (HOSPITAL_COMMUNITY)
Admission: RE | Admit: 2023-03-28 | Discharge: 2023-03-28 | Disposition: A | Discharge status: Home / Self Care | Payer: Medicare HMO | Source: Ambulatory Visit | Attending: Physician Assistant | Admitting: Physician Assistant

## 2023-03-28 DIAGNOSIS — C3491 Malignant neoplasm of unspecified part of right bronchus or lung: Secondary | ICD-10-CM | POA: Diagnosis present

## 2023-03-28 MED ORDER — HEPARIN SOD (PORK) LOCK FLUSH 100 UNIT/ML IV SOLN
500.0000 [IU] | Freq: Once | INTRAVENOUS | Status: AC
  Administered 2023-03-28: 500 [IU] via INTRAVENOUS

## 2023-03-28 MED ORDER — IOHEXOL 300 MG/ML  SOLN
100.0000 mL | Freq: Once | INTRAMUSCULAR | Status: AC | PRN
  Administered 2023-03-28: 100 mL via INTRAVENOUS

## 2023-03-28 MED ORDER — HEPARIN SOD (PORK) LOCK FLUSH 100 UNIT/ML IV SOLN
INTRAVENOUS | Status: AC
  Filled 2023-03-28: qty 5

## 2023-04-05 ENCOUNTER — Inpatient Hospital Stay: Payer: Medicare HMO

## 2023-04-05 ENCOUNTER — Inpatient Hospital Stay (HOSPITAL_BASED_OUTPATIENT_CLINIC_OR_DEPARTMENT_OTHER): Payer: Medicare HMO | Admitting: Hematology

## 2023-04-05 VITALS — BP 92/65 | HR 67 | Temp 98.7°F | Resp 18

## 2023-04-05 VITALS — BP 95/63 | HR 70 | Temp 97.9°F | Resp 20 | Wt 131.3 lb

## 2023-04-05 DIAGNOSIS — Z5112 Encounter for antineoplastic immunotherapy: Secondary | ICD-10-CM

## 2023-04-05 DIAGNOSIS — L509 Urticaria, unspecified: Secondary | ICD-10-CM | POA: Diagnosis not present

## 2023-04-05 DIAGNOSIS — Z95828 Presence of other vascular implants and grafts: Secondary | ICD-10-CM

## 2023-04-05 DIAGNOSIS — Z7189 Other specified counseling: Secondary | ICD-10-CM

## 2023-04-05 DIAGNOSIS — C3491 Malignant neoplasm of unspecified part of right bronchus or lung: Secondary | ICD-10-CM

## 2023-04-05 LAB — CMP (CANCER CENTER ONLY)
ALT: 10 U/L (ref 0–44)
AST: 14 U/L — ABNORMAL LOW (ref 15–41)
Albumin: 4.1 g/dL (ref 3.5–5.0)
Alkaline Phosphatase: 106 U/L (ref 38–126)
Anion gap: 4 — ABNORMAL LOW (ref 5–15)
BUN: 11 mg/dL (ref 6–20)
CO2: 30 mmol/L (ref 22–32)
Calcium: 9.6 mg/dL (ref 8.9–10.3)
Chloride: 104 mmol/L (ref 98–111)
Creatinine: 0.9 mg/dL (ref 0.61–1.24)
GFR, Estimated: >60 mL/min (ref >60)
Glucose, Bld: 118 mg/dL — ABNORMAL HIGH (ref 70–99)
Potassium: 4.3 mmol/L (ref 3.5–5.1)
Sodium: 138 mmol/L (ref 135–145)
Total Bilirubin: 0.4 mg/dL (ref <1.2)
Total Protein: 6.9 g/dL (ref 6.5–8.1)

## 2023-04-05 LAB — CBC WITH DIFFERENTIAL (CANCER CENTER ONLY)
Abs Immature Granulocytes: 0.01 10*3/uL (ref 0.00–0.07)
Basophils Absolute: 0 10*3/uL (ref 0.0–0.1)
Basophils Relative: 1 %
Eosinophils Absolute: 0 10*3/uL (ref 0.0–0.5)
Eosinophils Relative: 0 %
HCT: 39.4 % (ref 39.0–52.0)
Hemoglobin: 12.6 g/dL — ABNORMAL LOW (ref 13.0–17.0)
Immature Granulocytes: 0 %
Lymphocytes Relative: 19 %
Lymphs Abs: 0.9 10*3/uL (ref 0.7–4.0)
MCH: 29 pg (ref 26.0–34.0)
MCHC: 32 g/dL (ref 30.0–36.0)
MCV: 90.8 fL (ref 80.0–100.0)
Monocytes Absolute: 0.8 10*3/uL (ref 0.1–1.0)
Monocytes Relative: 16 %
Neutro Abs: 3.3 10*3/uL (ref 1.7–7.7)
Neutrophils Relative %: 64 %
Platelet Count: 288 10*3/uL (ref 150–400)
RBC: 4.34 MIL/uL (ref 4.22–5.81)
RDW: 13.6 % (ref 11.5–15.5)
WBC Count: 5.1 10*3/uL (ref 4.0–10.5)
nRBC: 0 % (ref 0.0–0.2)

## 2023-04-05 LAB — TSH: TSH: 2.658 u[IU]/mL (ref 0.350–4.500)

## 2023-04-05 MED ORDER — SODIUM CHLORIDE 0.9% FLUSH
10.0000 mL | INTRAVENOUS | Status: DC | PRN
  Administered 2023-04-05: 10 mL

## 2023-04-05 MED ORDER — DIPHENHYDRAMINE HCL 25 MG PO CAPS
25.0000 mg | ORAL_CAPSULE | Freq: Once | ORAL | Status: AC
  Administered 2023-04-05: 25 mg via ORAL
  Filled 2023-04-05: qty 1

## 2023-04-05 MED ORDER — FAMOTIDINE 20 MG PO TABS
20.0000 mg | ORAL_TABLET | Freq: Once | ORAL | Status: AC
  Administered 2023-04-05: 20 mg via ORAL
  Filled 2023-04-05: qty 1

## 2023-04-05 MED ORDER — SODIUM CHLORIDE 0.9 % IV SOLN: Freq: Once | INTRAVENOUS | Status: AC

## 2023-04-05 MED ORDER — ACETAMINOPHEN 325 MG PO TABS
650.0000 mg | ORAL_TABLET | Freq: Once | ORAL | Status: AC
  Administered 2023-04-05: 650 mg via ORAL
  Filled 2023-04-05: qty 2

## 2023-04-05 MED ORDER — HEPARIN SOD (PORK) LOCK FLUSH 100 UNIT/ML IV SOLN
500.0000 [IU] | Freq: Once | INTRAVENOUS | Status: AC | PRN
  Administered 2023-04-05: 500 [IU]

## 2023-04-05 MED ORDER — SODIUM CHLORIDE 0.9 % IV SOLN
1200.0000 mg | Freq: Once | INTRAVENOUS | Status: AC
  Administered 2023-04-05: 1200 mg via INTRAVENOUS
  Filled 2023-04-05: qty 20

## 2023-04-05 NOTE — Patient Instructions (Signed)
[facility] - A DEPT OF [facility]  Discharge Instructions: Thank you for choosing [facility] to provide your oncology and hematology care.   If you have a lab appointment with the Cancer Center, please go directly to the Cancer Center and check in at the registration area.   Wear comfortable clothing and clothing appropriate for easy access to any Portacath or PICC line.   We strive to give you quality time with your provider. You may need to reschedule your appointment if you arrive late (15 or more minutes).  Arriving late affects you and other patients whose appointments are after yours.  Also, if you miss three or more appointments without notifying the office, you may be dismissed from the clinic at the provider's discretion.      For prescription refill requests, have your pharmacy contact our office and allow 72 hours for refills to be completed.    Today you received the following chemotherapy and/or immunotherapy agent: Atezolizumab (Tecentriq)      To help prevent nausea and vomiting after your treatment, we encourage you to take your nausea medication as directed.  BELOW ARE SYMPTOMS THAT SHOULD BE REPORTED IMMEDIATELY: *FEVER GREATER THAN 100.4 F (38 C) OR HIGHER *CHILLS OR SWEATING *NAUSEA AND VOMITING THAT IS NOT CONTROLLED WITH YOUR NAUSEA MEDICATION *UNUSUAL SHORTNESS OF BREATH *UNUSUAL BRUISING OR BLEEDING *URINARY PROBLEMS (pain or burning when urinating, or frequent urination) *BOWEL PROBLEMS (unusual diarrhea, constipation, pain near the anus) TENDERNESS IN MOUTH AND THROAT WITH OR WITHOUT PRESENCE OF ULCERS (sore throat, sores in mouth, or a toothache) UNUSUAL RASH, SWELLING OR PAIN  UNUSUAL VAGINAL DISCHARGE OR ITCHING   Items with * indicate a potential emergency and should be followed up as soon as possible or go to the Emergency Department if any problems should occur.  Please show the CHEMOTHERAPY ALERT CARD or  IMMUNOTHERAPY ALERT CARD at check-in to the Emergency Department and triage nurse.  Should you have questions after your visit or need to cancel or reschedule your appointment, please contact [facility] - A DEPT OF [facility]  Dept: [phone]  and follow the prompts.  Office hours are 8:00 a.m. to 4:30 p.m. Monday - Friday. Please note that voicemails left after 4:00 p.m. may not be returned until the following business day.  We are closed weekends and major holidays. You have access to a nurse at all times for urgent questions. Please call the main number to the clinic Dept: [phone] and follow the prompts.   For any non-urgent questions, you may also contact your provider using MyChart. We now offer e-Visits for anyone 46 and older to request care online for non-urgent symptoms. For details visit [email or web address].   Also download the MyChart app! Go to the app store, search "MyChart", open the app, select [place], and log in with your MyChart username and password.

## 2023-04-05 NOTE — Progress Notes (Signed)
HEMATOLOGY/ONCOLOGY CLINIC NOTE  Date of Service: 04/05/23  Patient Care Team: [name], MD as PCP - General (Family Medicine)   CHIEF COMPLAINTS/PURPOSE OF CONSULTATION:  Follow-up for continued evaluation and management of metastatic lung adenocarcinoma  HISTORY OF PRESENTING ILLNESS:  Please see previous notes for details on initial presentation.  Current Treatment:  Atezolizumab maintenance   INTERVAL HISTORY:   David Berry. Is a 57 y.o. male here for continued evaluation and management of his metastatic lung adenocarcinoma. He is here for cycle 21 day 1 of his treatment.   Patient was last seen by PA Thayil on 02/23/2023 and he complained of intermittent episodes of nausea, occasional positional dizziness, skin rash on his arms, thighs and trunk.   Patient notes he has been doing well overall since our last visit. He complains of leg and back stiffness since our last visit. He denies any new infection issues, fever, chills, night sweats, unexpected weight loss, chest pain, abdominal pain, or leg swelling.   Patient reports of multiple episodes of hives throughout his body. He's most recent episode of hives was yesterday after eating spaghetti. He notes that hives lasts couple of hours. He denies any new changes. He denies any pets at home. Patient does have stray cats near his home. He has been getting hives at least once a week.   He complains of mild occasional headaches and dizziness.   Patient notes he has been tolerating his treatment well without any new or severe medical toxicities.   MEDICAL HISTORY:  Past Medical History:  Diagnosis Date   Anxiety    Bipolar disorder (HCC)    Blood in stool    Cancer (HCC)    Chronic bronchitis with emphysema (HCC)    pt denies this   GERD (gastroesophageal reflux disease)    Lung cancer (HCC) dx'd 01/2019   Peripheral vascular disease (HCC)    blood clot in neck Sept 12, 2020    SURGICAL  HISTORY: Past Surgical History:  Procedure Laterality Date   APPENDECTOMY     teenager   INGUINAL HERNIA REPAIR Right 2016, 2017   [facility], 2018 [facility]-removed mesh   IR IMAGING GUIDED PORT INSERTION  04/26/2019   TOOTH EXTRACTION N/A 03/14/2020   Procedure: DENTAL RESTORATION/EXTRACTIONS;  Surgeon: Ocie Doyne, DDS;  Location: [facility] OR;  Service: Oral Surgery;  Laterality: N/A;    SOCIAL HISTORY: Social History   Socioeconomic History   Marital status: Single    Spouse name: Not on file   Number of children: 1   Years of education: GED   Highest education level: Some college, no degree  Occupational History    Comment: fork lift driver  Tobacco Use   Smoking status: Former    Types: Cigars    Quit date: 01/20/2019    Years since quitting: 4.2   Smokeless tobacco: Never   Tobacco comments:    smokes 3 black and mild cigars per day  Vaping Use   Vaping status: Never Used  Substance and Sexual Activity   Alcohol use: Not Currently   Drug use: No   Sexual activity: Yes  Other Topics Concern   Not on file  Social History Narrative   Lives alone   Caffeine- coffee, 20 oz daily, tea occas   Social Determinants of Health   Financial Resource Strain: Low Risk  (09/29/2020)   Overall Financial Resource Strain (CARDIA)    Difficulty of Paying Living Expenses: Not very  hard  Food Insecurity: No Food Insecurity (09/29/2020)   Hunger Vital Sign    Worried About Running Out of Food in the Last Year: Never true    Ran Out of Food in the Last Year: Never true  Transportation Needs: No Transportation Needs (09/29/2020)   PRAPARE - Administrator, Civil Service (Medical): No    Lack of Transportation (Non-Medical): No  Physical Activity: Sufficiently Active (09/29/2020)   Exercise Vital Sign    Days of Exercise per Week: 3 days    Minutes of Exercise per Session: 150+ min  Stress: No Stress Concern Present (09/29/2020)   [employer] of Occupational  Health - Occupational Stress Questionnaire    Feeling of Stress : Not at all  Social Connections: Unknown (03/01/2022)   Received from [facility], [facility]   Social Network    Social Network: Not on file  Intimate Partner Violence: Unknown (03/01/2022)   [facility] from [facility], [facility]   HITS    Physically Hurt: Not on file    Insult or Talk Down To: Not on file    Threaten Physical Harm: Not on file    Scream or Curse: Not on file    FAMILY HISTORY: Family History  Problem Relation Age of Onset   Prostate cancer Father    Colon cancer Neg Hx    Stomach cancer Neg Hx    Esophageal cancer Neg Hx     ALLERGIES:  is allergic to atorvastatin calcium.  MEDICATIONS:  Current Outpatient Medications  Medication Sig Dispense Refill   apixaban (ELIQUIS) 2.5 MG TABS tablet Take 1 tablet (2.5 mg total) by mouth 2 (two) times daily. 60 tablet 5   Cyanocobalamin (VITAMIN B 12 PO) Take 2,500 mcg by mouth daily.     esomeprazole (NEXIUM) 40 MG capsule TAKE 1 CAPSULE(40 MG) BY MOUTH DAILY 90 capsule 2   lactose free nutrition (BOOST PLUS) LIQD Drink one carton (237 ml) Boost Plus/equivalent three times daily in between meals     lidocaine-prilocaine (EMLA) cream Apply 1 Application topically as needed. 30 g 0   ondansetron (ZOFRAN) 4 MG tablet Take 1 tablet (4 mg total) by mouth every 8 (eight) hours as needed for nausea or vomiting. 30 tablet 1   propranolol (INDERAL) 20 MG tablet TAKE 1 TABLET(20 MG) BY MOUTH TWICE DAILY 60 tablet 5   sildenafil (VIAGRA) 50 MG tablet Take 1 tablet (50 mg total) by mouth daily as needed for erectile dysfunction. Take 30 mins to 4 hours prior to sexual activity 30 tablet 1   No current facility-administered medications for this visit.   REVIEW OF SYSTEMS:    10 Point review of Systems was done is negative except as noted above.   PHYSICAL EXAMINATION: ECOG FS:1 - Symptomatic but completely ambulatory .There were no vitals taken for  this visit.  Wt Readings from Last 3 Encounters:  03/16/23 131 lb 12.8 oz (59.8 kg)  02/23/23 133 lb 1.6 oz (60.4 kg)  02/07/23 131 lb (59.4 kg)   Body mass index is 18.31 kg/m.    GENERAL:alert, in no acute distress and comfortable SKIN: no acute rashes, no significant lesions EYES: conjunctiva are pink and non-injected, sclera anicteric OROPHARYNX: MMM, no exudates, no oropharyngeal erythema or ulceration NECK: supple, no JVD LYMPH:  no palpable lymphadenopathy in the cervical, axillary or inguinal regions LUNGS: clear to auscultation b/l with normal respiratory effort HEART: regular rate & rhythm ABDOMEN:  normoactive bowel sounds , non tender,  not distended. Extremity: no pedal edema PSYCH: alert & oriented x 3 with fluent speech NEURO: no focal motor/sensory deficits   LABORATORY DATA:  I have reviewed the data as listed    Latest Ref Rng & Units 03/16/2023    1:10 PM 02/23/2023    1:00 PM 02/02/2023   11:56 AM  CBC  WBC 4.0 - 10.5 K/uL 4.5  4.5  4.5   Hemoglobin 13.0 - 17.0 g/dL 14.7  82.9  56.2   Hematocrit 39.0 - 52.0 % 36.2  39.9  39.9   Platelets 150 - 400 K/uL 274  274  277       Latest Ref Rng & Units 03/16/2023    1:10 PM 02/23/2023    1:00 PM 02/02/2023   11:56 AM  CMP  Glucose 70 - 99 mg/dL 130  865  92   BUN 6 - 20 mg/dL 8  7  9    Creatinine 0.61 - 1.24 mg/dL 7.84  6.96  2.95   Sodium 135 - 145 mmol/L 137  138  138   Potassium 3.5 - 5.1 mmol/L 4.2  4.3  4.0   Chloride 98 - 111 mmol/L 105  104  105   CO2 22 - 32 mmol/L 29  28  29    Calcium 8.9 - 10.3 mg/dL 9.4  9.5  9.4   Total Protein 6.5 - 8.1 g/dL 6.6  6.8  6.8   Total Bilirubin <1.2 mg/dL 0.4  0.4  0.4   Alkaline Phos 38 - 126 U/L 103  99  105   AST 15 - 41 U/L 13  14  13    ALT 0 - 44 U/L 9  9  10     Lab Results  Component Value Date   IRON 16 (L) 05/05/2022   TIBC 448 05/05/2022   FERRITIN 52 10/20/2022    01/22/2019 Foundation One: Tumor Mutational Burden     01/22/2019 PD-L1  Immunohistochemistry Analysis    01/22/2019 Soft Tissue Needle Core Biopsy Surgical Pathology     RADIOGRAPHIC STUDIES: I have personally reviewed the radiological images as listed and agreed with the findings in the report. No results found.   ASSESSMENT & PLAN:   This is a 57 year old male with   1. Metastatic Poorly differentiated lung adenocarcinoma Presented with Large neck mass, mediastinal mass, renal mass, and questionable bone lesions  no brain mets on MRI brain 01/22/2019 PD-L1 Immunohistochemistry Analysis which revealed "Tumor Proportion Score (TPS) 50%" 05/16/2019 PET/CT ([id number]) revealed "Radiation changes in the right hemithorax, as above. Improving mediastinal nodal metastases. Prior bulky right cervical metastases have resolved. No evidence of metastatic disease in the abdomen/pelvis." 07/19/2019 Esophagus Scan ([id number]) revealed "Mild stricture at the C6-7 level likely related to prior esophageal surgery. Barium tablet was slow to pass through this area but did pass through after 1 minutes. Hiatal hernia with moderate gastroesophageal reflux. Moderate stricture above the hiatal hernia likely due to reflux. Barium tablet did not pass this area. There are changes of esophagitis which are likely due to reflux and possibly radiation as well."  10/10/19 of  CT Chest W Contrast ([id number])- no evidence of lung cancer progression at this time. He did have a PET CT scan on 04/13/2021 which was reviewed on the previous visit by Karena Addison.  This showed some borderline FDG avid right paratracheal right hilar and azygoesophageal lymph nodes that were suspicious for possible nodal recurrence however the patient notes that he did have a bad upper respiratory tract infection around  the time that he had his PET CT scan and therefore reactive adenopathy is a possibility as well.    2. 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 Symptomatic hemorrhoids-currently no significant bleeding but have caused some mild iron deficiency anemia.  5. Normocytic anemia -Likely due to underlying malignancy + Iron deficiency due to ongoing bleeding from hemorrhoids   6. S/pThrombocytosis -- likely due to paraneoplastic effect of tumor and reactive due to inflammation and tissue inflammation from RT.-- now resolved.   7. Nicotine dependence -has quit since cancer diagnosis  8. Iron deficiency - likely due to blood loss from hemorrhoids  9.  Erectile dysfunction-having partial benefits with using Viagra.  PLAN:   -Discussed lab results from today, 04/05/2023, in detail with the patient. CBC stable. CMP stable.  -CT Chest Abdomen Plevis results from 03/28/2023 pending.  -Discussed the option of getting referred to allergist for hives. Patient agrees. -Will refer the patient to Allergist.    -TSH pending -Patient has no lab or clinical evidence of lung cancer recurrence/progression at this time. -patient has no notable toxicities with immunotherapy at this time -continue Atezolizumab immunotherapy -continue Eliquis 2.5mg  BID today.  . Orders Placed This Encounter  Procedures   CBC with Differential (Cancer Center Only)    Standing Status:   Future    Standing Expiration Date:   06/07/2024   CMP (Cancer Center only)    Standing Status:   Future    Standing Expiration Date:   06/07/2024   TSH    Standing Status:   Future    Standing Expiration Date:   06/07/2024   Ambulatory referral to Allergy    Referral Priority:   Routine    Referral Type:   Allergy Testing    Referral Reason:   Specialty Services Required    Requested Specialty:   Allergy    Number of Visits Requested:   1    FOLLOW UP: Per integrated scheduling Referral to Allergy/Immunology MD visit in 6 weeks  The total time spent in the appointment was 30 minutes* .  All of  the patient's questions were answered with apparent satisfaction. The patient knows to call the clinic with any problems, questions or concerns.   [name] MD MS AAHIVMS [facility] Hematology/Oncology Physician [facility]  .*Total Encounter Time as defined by the Centers for Medicare and Medicaid Services includes, in addition to the face-to-face time of a patient visit (documented in the note above) non-face-to-face time: obtaining and reviewing outside history, ordering and reviewing medications, tests or procedures, care coordination (communications with other health care professionals or caregivers) and documentation in the medical record.   I,Param Shah,acting as a Neurosurgeon for [name], MD.,have documented all relevant documentation on the behalf of [name], MD,as directed by  [name], MD while in the presence of [name], MD.   .I have reviewed the above documentation for accuracy and completeness, and I agree with the above. [name] MD

## 2023-04-05 NOTE — Progress Notes (Signed)
Patient seen by Dr. Addison Naegeli are within treatment parameters.  Labs reviewed: and are within treatment parameters.  Per physician team, patient is ready for treatment and there are NO modifications to the treatment plan.

## 2023-04-07 ENCOUNTER — Other Ambulatory Visit: Payer: Self-pay

## 2023-04-11 ENCOUNTER — Encounter: Payer: Self-pay | Admitting: Hematology

## 2023-04-11 ENCOUNTER — Encounter: Payer: Self-pay | Admitting: Physician Assistant

## 2023-04-15 ENCOUNTER — Encounter: Payer: Self-pay | Admitting: Urology

## 2023-04-15 ENCOUNTER — Encounter: Payer: Self-pay | Admitting: Physician Assistant

## 2023-04-15 ENCOUNTER — Encounter: Payer: Self-pay | Admitting: Hematology

## 2023-04-19 ENCOUNTER — Ambulatory Visit
Admission: RE | Admit: 2023-04-19 | Discharge: 2023-04-19 | Disposition: A | Discharge status: Home / Self Care | Payer: Medicare HMO | Source: Ambulatory Visit | Attending: Urology | Admitting: Urology

## 2023-04-19 DIAGNOSIS — D3001 Benign neoplasm of right kidney: Secondary | ICD-10-CM

## 2023-04-19 MED ORDER — GADOPICLENOL 0.5 MMOL/ML IV SOLN
6.0000 mL | Freq: Once | INTRAVENOUS | Status: AC | PRN
  Administered 2023-04-19: 6 mL via INTRAVENOUS

## 2023-04-27 ENCOUNTER — Inpatient Hospital Stay: Payer: Medicare HMO | Admitting: Hematology

## 2023-04-27 ENCOUNTER — Encounter: Payer: Self-pay | Admitting: Allergy

## 2023-04-27 ENCOUNTER — Inpatient Hospital Stay: Payer: Medicare HMO | Attending: Physician Assistant

## 2023-04-27 ENCOUNTER — Ambulatory Visit (INDEPENDENT_AMBULATORY_CARE_PROVIDER_SITE_OTHER): Payer: Medicare HMO | Admitting: Allergy

## 2023-04-27 ENCOUNTER — Other Ambulatory Visit: Payer: Self-pay

## 2023-04-27 ENCOUNTER — Encounter: Payer: Self-pay | Admitting: Hematology

## 2023-04-27 VITALS — BP 102/68 | HR 68 | Temp 98.2°F | Resp 18 | Wt 131.0 lb

## 2023-04-27 VITALS — BP 100/70 | HR 75 | Temp 98.3°F | Ht 69.5 in | Wt 128.2 lb

## 2023-04-27 DIAGNOSIS — Z83718 Other family history of colon polyps: Secondary | ICD-10-CM | POA: Diagnosis not present

## 2023-04-27 DIAGNOSIS — Z7901 Long term (current) use of anticoagulants: Secondary | ICD-10-CM | POA: Insufficient documentation

## 2023-04-27 DIAGNOSIS — Z87891 Personal history of nicotine dependence: Secondary | ICD-10-CM | POA: Insufficient documentation

## 2023-04-27 DIAGNOSIS — L2089 Other atopic dermatitis: Secondary | ICD-10-CM

## 2023-04-27 DIAGNOSIS — J3089 Other allergic rhinitis: Secondary | ICD-10-CM

## 2023-04-27 DIAGNOSIS — C3491 Malignant neoplasm of unspecified part of right bronchus or lung: Secondary | ICD-10-CM | POA: Diagnosis present

## 2023-04-27 DIAGNOSIS — Z5112 Encounter for antineoplastic immunotherapy: Secondary | ICD-10-CM | POA: Diagnosis present

## 2023-04-27 DIAGNOSIS — L509 Urticaria, unspecified: Secondary | ICD-10-CM

## 2023-04-27 DIAGNOSIS — Z79899 Other long term (current) drug therapy: Secondary | ICD-10-CM | POA: Insufficient documentation

## 2023-04-27 DIAGNOSIS — C771 Secondary and unspecified malignant neoplasm of intrathoracic lymph nodes: Secondary | ICD-10-CM | POA: Diagnosis present

## 2023-04-27 DIAGNOSIS — Z7189 Other specified counseling: Secondary | ICD-10-CM

## 2023-04-27 LAB — CMP (CANCER CENTER ONLY)
ALT: 10 U/L (ref 0–44)
AST: 15 U/L (ref 15–41)
Albumin: 4.3 g/dL (ref 3.5–5.0)
Alkaline Phosphatase: 130 U/L — ABNORMAL HIGH (ref 38–126)
Anion gap: 5 (ref 5–15)
BUN: 9 mg/dL (ref 6–20)
CO2: 28 mmol/L (ref 22–32)
Calcium: 9.7 mg/dL (ref 8.9–10.3)
Chloride: 105 mmol/L (ref 98–111)
Creatinine: 0.88 mg/dL (ref 0.61–1.24)
GFR, Estimated: >60 mL/min (ref >60)
Glucose, Bld: 109 mg/dL — ABNORMAL HIGH (ref 70–99)
Potassium: 3.8 mmol/L (ref 3.5–5.1)
Sodium: 138 mmol/L (ref 135–145)
Total Bilirubin: 0.4 mg/dL (ref <1.2)
Total Protein: 7.1 g/dL (ref 6.5–8.1)

## 2023-04-27 LAB — CBC WITH DIFFERENTIAL (CANCER CENTER ONLY)
Abs Immature Granulocytes: 0.01 10*3/uL (ref 0.00–0.07)
Basophils Absolute: 0 10*3/uL (ref 0.0–0.1)
Basophils Relative: 1 %
Eosinophils Absolute: 0 10*3/uL (ref 0.0–0.5)
Eosinophils Relative: 0 %
HCT: 38.5 % — ABNORMAL LOW (ref 39.0–52.0)
Hemoglobin: 13.1 g/dL (ref 13.0–17.0)
Immature Granulocytes: 0 %
Lymphocytes Relative: 20 %
Lymphs Abs: 1.2 10*3/uL (ref 0.7–4.0)
MCH: 30 pg (ref 26.0–34.0)
MCHC: 34 g/dL (ref 30.0–36.0)
MCV: 88.1 fL (ref 80.0–100.0)
Monocytes Absolute: 0.9 10*3/uL (ref 0.1–1.0)
Monocytes Relative: 14 %
Neutro Abs: 4 10*3/uL (ref 1.7–7.7)
Neutrophils Relative %: 65 %
Platelet Count: 346 10*3/uL (ref 150–400)
RBC: 4.37 MIL/uL (ref 4.22–5.81)
RDW: 13.2 % (ref 11.5–15.5)
WBC Count: 6.2 10*3/uL (ref 4.0–10.5)
nRBC: 0 % (ref 0.0–0.2)

## 2023-04-27 MED ORDER — SODIUM CHLORIDE 0.9 % IV SOLN: Freq: Once | INTRAVENOUS | Status: AC

## 2023-04-27 MED ORDER — FAMOTIDINE 20 MG PO TABS
20.0000 mg | ORAL_TABLET | Freq: Once | ORAL | Status: AC
  Administered 2023-04-27: 20 mg via ORAL
  Filled 2023-04-27: qty 1

## 2023-04-27 MED ORDER — ACETAMINOPHEN 325 MG PO TABS
650.0000 mg | ORAL_TABLET | Freq: Once | ORAL | Status: AC
  Administered 2023-04-27: 650 mg via ORAL
  Filled 2023-04-27: qty 2

## 2023-04-27 MED ORDER — SODIUM CHLORIDE 0.9 % IV SOLN
1200.0000 mg | Freq: Once | INTRAVENOUS | Status: AC
  Administered 2023-04-27: 1200 mg via INTRAVENOUS
  Filled 2023-04-27: qty 20

## 2023-04-27 MED ORDER — DIPHENHYDRAMINE HCL 25 MG PO CAPS
25.0000 mg | ORAL_CAPSULE | Freq: Once | ORAL | Status: AC
  Administered 2023-04-27: 25 mg via ORAL
  Filled 2023-04-27: qty 1

## 2023-04-27 MED ORDER — CETIRIZINE HCL 10 MG PO TABS: 10.0000 mg | ORAL_TABLET | Freq: Two times a day (BID) | ORAL | 1 refills | Status: DC | PRN

## 2023-04-27 MED ORDER — FAMOTIDINE 20 MG PO TABS: 20.0000 mg | ORAL_TABLET | Freq: Two times a day (BID) | ORAL | 1 refills | Status: DC

## 2023-04-27 MED ORDER — HEPARIN SOD (PORK) LOCK FLUSH 100 UNIT/ML IV SOLN
500.0000 [IU] | Freq: Once | INTRAVENOUS | Status: AC | PRN
  Administered 2023-04-27: 500 [IU]

## 2023-04-27 MED ORDER — SODIUM CHLORIDE 0.9% FLUSH
10.0000 mL | INTRAVENOUS | Status: DC | PRN
  Administered 2023-04-27: 10 mL

## 2023-04-27 NOTE — Progress Notes (Signed)
Pt is okay to get pre-meds today while labs are pending, per Dr [name].

## 2023-04-27 NOTE — Patient Instructions (Addendum)
 [facility] - A DEPT OF [facility]  Discharge Instructions: Thank you for choosing [facility] to provide your oncology and hematology care.   If you have a lab appointment with the Cancer Center, please go directly to the Cancer Center and check in at the registration area.   Wear comfortable clothing and clothing appropriate for easy access to any Portacath or PICC line.   We strive to give you quality time with your provider. You may need to reschedule your appointment if you arrive late (15 or more minutes).  Arriving late affects you and other patients whose appointments are after yours.  Also, if you miss three or more appointments without notifying the office, you may be dismissed from the clinic at the provider's discretion.      For prescription refill requests, have your pharmacy contact our office and allow 72 hours for refills to be completed.    Today you received the following chemotherapy and/or immunotherapy agents: Tecentriq.       To help prevent nausea and vomiting after your treatment, we encourage you to take your nausea medication as directed.  BELOW ARE SYMPTOMS THAT SHOULD BE REPORTED IMMEDIATELY: *FEVER GREATER THAN 100.4 F (38 C) OR HIGHER *CHILLS OR SWEATING *NAUSEA AND VOMITING THAT IS NOT CONTROLLED WITH YOUR NAUSEA MEDICATION *UNUSUAL SHORTNESS OF BREATH *UNUSUAL BRUISING OR BLEEDING *URINARY PROBLEMS (pain or burning when urinating, or frequent urination) *BOWEL PROBLEMS (unusual diarrhea, constipation, pain near the anus) TENDERNESS IN MOUTH AND THROAT WITH OR WITHOUT PRESENCE OF ULCERS (sore throat, sores in mouth, or a toothache) UNUSUAL RASH, SWELLING OR PAIN  UNUSUAL VAGINAL DISCHARGE OR ITCHING   Items with * indicate a potential emergency and should be followed up as soon as possible or go to the Emergency Department if any problems should occur.  Please show the CHEMOTHERAPY ALERT CARD or  IMMUNOTHERAPY ALERT CARD at check-in to the Emergency Department and triage nurse.  Should you have questions after your visit or need to cancel or reschedule your appointment, please contact [facility] - A DEPT OF [facility]  Dept: [phone]  and follow the prompts.  Office hours are 8:00 a.m. to 4:30 p.m. Monday - Friday. Please note that voicemails left after 4:00 p.m. may not be returned until the following business day.  We are closed weekends and major holidays. You have access to a nurse at all times for urgent questions. Please call the main number to the clinic Dept: [phone] and follow the prompts.   For any non-urgent questions, you may also contact your provider using MyChart. We now offer e-Visits for anyone 21 and older to request care online for non-urgent symptoms. For details visit [email or web address].   Also download the MyChart app! Go to the app store, search "MyChart", open the app, select [place], and log in with your MyChart username and password.

## 2023-04-27 NOTE — Progress Notes (Signed)
New Patient Note  RE: David Berry. MRN: [id number] DOB: [birth year] Date of Office Visit: 04/27/2023  Consult requested by: [name], MD Primary care provider: [name], MD  Chief Complaint: Urticaria (States that medication or food is causing hive flare. C/o excessive swelling after mosquito bite progressed over the years.) and Pruritus  History of Present Illness: I had the pleasure of seeing David Berry for initial evaluation at the [facility] on 04/27/2023. He is a 57 y.o. male, who is referred here by Dr. [name] (heme/onc) for the evaluation of hives.  Discussed the use of AI scribe software for clinical note transcription with the patient, who gave verbal consent to proceed.  The patient, under the care of a hematologist for stage four lung cancer diagnosed in September 2020, presents with a year-long history of hives. The hives, which have been progressively worsening, are described as raised bumps that appear all over the body, with a predilection for the legs and torso. The patient reports that these hives are occasionally itchy, particularly during flare-ups, and tend to resolve within 24 hours without leaving any residual marks.  Over the past month, the patient has been experiencing daily episodes of these hives, typically flaring up in the evenings. The patient has not identified any specific triggers for these episodes and denies any associated symptoms.  The patient also has a history of eczema, which is currently well-controlled with otc lotion. He reports no known allergies to medications or foods. The patient denies any recent fevers, chills, or infections requiring antibiotics.  The patient initially thought that rosuvastatin may be a trigger as it was started around the time the hives first appeared. The patient discontinued the rosuvastatin but the hives persisted. The patient also receives immunotherapy for his lung  cancer once every three weeks, a regimen that has been ongoing since 2021.  The patient reports some coughing and phlegm production, but denies any shortness of breath or chest tightness. He also reports lower back and leg pain. The patient's appetite is reportedly affected by his chemotherapy, but he continues to maintain a varied diet.     He has tried the following therapies: no medications. Systemic steroids: no. Currently on no daily antihistamines.  Previous work up includes: 04/05/2023 TSH, CMP, CBC diff unremarkable. Previous history of rash/hives: none. Patient is up to date with the following cancer screening tests: physical exam, colonoscopy.   Follows with heme/onc for stage 4 lung cancer.   Reviewed images on the phone - consistent with urticarial rash.   04/05/2023 heme/onc visit: "Patient reports of multiple episodes of hives throughout his body. He's most recent episode of hives was yesterday after eating spaghetti. He notes that hives lasts couple of hours. He denies any new changes. He denies any pets at home. Patient does have stray cats near his home. He has been getting hives at least once a week. "  Assessment and Plan: Lamine is a 57 y.o. male with: Urticaria Daily hives for the past month, worsening over the past year. Raised, red, itchy lesions on the torso and legs that resolve within 24 hours. No clear triggers identified. Medical history significant for stage 4 lung cancer under the care of oncology. Etiology unclear - unlikely to be related to atorvastatin as hives continued since cessation of medication.  Keep track of episodes. Take pictures of the rashes. Start zyrtec (cetirizine) 10mg  1-2 times per day. If symptoms are not controlled or causes  drowsiness let us know. Start Pepcid (famotidine) 20mg  twice a day.  Avoid the following potential triggers: alcohol, tight clothing, NSAIDs, hot showers and getting overheated. See below for proper skin care.  Get  bloodwork.  Other atopic dermatitis Continue proper skin care.  Other allergic rhinitis Get bloodwork.  Return in about 4 weeks (around 05/25/2023).  Meds ordered this encounter  Medications   cetirizine (ZYRTEC ALLERGY) 10 MG tablet    Sig: Take 1 tablet (10 mg total) by mouth 2 (two) times daily as needed (hives).    Dispense:  60 tablet    Refill:  1   famotidine (PEPCID) 20 MG tablet    Sig: Take 1 tablet (20 mg total) by mouth 2 (two) times daily.    Dispense:  60 tablet    Refill:  1   Lab Orders         Allergens w/Total IgE Area 2         Alpha-Gal Panel         ANA w/Reflex         Chronic Urticaria         C-reactive protein         Food Allergy Profile         Sedimentation rate         Tryptase      Other allergy screening: Asthma: no Rhino conjunctivitis: no Food allergy: no Medication allergy: no Hymenoptera allergy: no Eczema: Using moisturizer with good benefit.  History of recurrent infections suggestive of immunodeficency: no  Diagnostics: None.  Past Medical History: Patient Active Problem List   Diagnosis Date Noted   Intention tremor 04/21/2021   Mononeuropathy 04/21/2021   Chronic anticoagulation 02/28/2021   Right shoulder pain 02/28/2021   Sternoclavicular joint pain, right 02/28/2021   Encounter for immunotherapy 10/29/2020   Iron deficiency anemia 10/29/2020   Gastroesophageal reflux disease 10/02/2020   Hypotension due to hypovolemia 10/02/2020   Port-A-Cath in place 06/13/2019   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/26/2019   Former smoker 02/26/2019   Adenocarcinoma of right lung (HCC) 02/05/2019   Counseling regarding advance care planning and goals of care 02/05/2019   Acute deep vein thrombosis (DVT) of non-extremity vein    Adenocarcinoma of lung (HCC)    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   Internal jugular (IJ) vein thromboembolism, acute, right (HCC) 01/24/2019   Mass of right side of neck, s/p Radiation therapy 01/24/2019    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   Right renal mass 01/24/2019   Hip mass, right 01/24/2019   Thyromegaly 01/24/2019   Extravasation injury of IV catheter site with other complication (HCC)    BACK PAIN, LUMBAR 03/24/2010   LUMBAR RADICULOPATHY 03/24/2010   Past Medical History:  Diagnosis Date   Anxiety    Bipolar disorder (HCC)    Blood in stool    Cancer (HCC)    Chronic bronchitis with emphysema (HCC)    pt denies this   Eczema    GERD (gastroesophageal reflux disease)    Lung cancer (HCC) dx'd 01/2019   Peripheral vascular disease (HCC)    blood clot in neck Sept 12, 2020   Past Surgical History: Past Surgical History:  Procedure Laterality Date   APPENDECTOMY     teenager   INGUINAL HERNIA REPAIR Right 2016, 2017   [facility], 2018 [facility]-removed mesh   IR IMAGING GUIDED PORT INSERTION  04/26/2019   TONSILLECTOMY     TOOTH EXTRACTION N/A 03/14/2020   Procedure:  DENTAL RESTORATION/EXTRACTIONS;  Surgeon: Ocie Doyne, DDS;  Location: [facility];  Service: Oral Surgery;  Laterality: N/A;   Medication List:  Current Outpatient Medications  Medication Sig Dispense Refill   apixaban (ELIQUIS) 2.5 MG TABS tablet Take 1 tablet (2.5 mg total) by mouth 2 (two) times daily. 60 tablet 5   cetirizine (ZYRTEC ALLERGY) 10 MG tablet Take 1 tablet (10 mg total) by mouth 2 (two) times daily as needed (hives). 60 tablet 1   Cyanocobalamin (VITAMIN B 12 PO) Take 2,500 mcg by mouth daily.     esomeprazole (NEXIUM) 40 MG capsule TAKE 1 CAPSULE(40 MG) BY MOUTH DAILY 90 capsule 2   famotidine (PEPCID) 20 MG tablet Take 1 tablet (20 mg total) by mouth 2 (two) times daily. 60 tablet 1   famotidine (PEPCID) 40 MG tablet SMARTSIG:1 Tablet(s) By Mouth Every Evening     lidocaine-prilocaine (EMLA) cream Apply 1 Application topically as needed. 30 g 0   ondansetron (ZOFRAN) 4 MG tablet Take 1 tablet (4 mg total) by mouth every 8 (eight) hours as needed for nausea or vomiting. 30 tablet 1    propranolol (INDERAL) 20 MG tablet TAKE 1 TABLET(20 MG) BY MOUTH TWICE DAILY 60 tablet 5   sildenafil (VIAGRA) 50 MG tablet Take 1 tablet (50 mg total) by mouth daily as needed for erectile dysfunction. Take 30 mins to 4 hours prior to sexual activity 30 tablet 1   No current facility-administered medications for this visit.   Allergies: Allergies  Allergen Reactions   Atorvastatin Calcium Rash    All statins   Social History: Social History   Socioeconomic History   Marital status: Single    Spouse name: Not on file   Number of children: 1   Years of education: GED   Highest education level: Some college, no degree  Occupational History    Comment: fork lift driver  Tobacco Use   Smoking status: Former    Types: Cigars    Quit date: 01/20/2019    Years since quitting: 4.2    Passive exposure: Current   Smokeless tobacco: Never   Tobacco comments:    smokes 3 black and mild cigars per day  Vaping Use   Vaping status: Never Used  Substance and Sexual Activity   Alcohol use: Not Currently   Drug use: No   Sexual activity: Yes  Other Topics Concern   Not on file  Social History Narrative   Lives alone   Caffeine- coffee, 20 oz daily, tea occas   Social Drivers of Health   Financial Resource Strain: Low Risk  (09/29/2020)   Overall Financial Resource Strain (CARDIA)    Difficulty of Paying Living Expenses: Not very hard  Food Insecurity: No Food Insecurity (09/29/2020)   Hunger Vital Sign    Worried About Running Out of Food in the Last Year: Never true    Ran Out of Food in the Last Year: Never true  Transportation Needs: No Transportation Needs (09/29/2020)   PRAPARE - Administrator, Civil Service (Medical): No    Lack of Transportation (Non-Medical): No  Physical Activity: Sufficiently Active (09/29/2020)   Exercise Vital Sign    Days of Exercise per Week: 3 days    Minutes of Exercise per Session: 150+ min  Stress: No Stress Concern Present  (09/29/2020)   [employer] of Occupational Health - Occupational Stress Questionnaire    Feeling of Stress : Not at all  Social Connections: Unknown (03/01/2022)  Received from [facility], [facility]   Social Network    Social Network: Not on file   Lives in a building with rooms. Smoking: smoke weed. Occupation: not employed  Landscape architect HistorySurveyor, minerals in the house: no Engineer, civil (consulting) in the family room: no Carpet in the bedroom: yes Heating: electric Cooling: central Pet: no  Family History: Family History  Problem Relation Age of Onset   Prostate cancer Father    Colon cancer Neg Hx    Stomach cancer Neg Hx    Esophageal cancer Neg Hx    Problem                               Relation Asthma                                   no Eczema                                no Food allergy                          no Allergic rhino conjunctivitis     no  Review of Systems  Constitutional:  Negative for appetite change, chills, fever and unexpected weight change.  HENT:  Positive for congestion. Negative for rhinorrhea.   Eyes:  Negative for itching.  Respiratory:  Positive for cough. Negative for chest tightness, shortness of breath and wheezing.   Cardiovascular:  Negative for chest pain.  Gastrointestinal:  Negative for abdominal pain.  Genitourinary:  Negative for difficulty urinating.  Musculoskeletal:  Positive for arthralgias.  Skin:  Positive for rash.  Neurological:  Negative for headaches.    Objective: BP 100/70   Pulse 75   Temp 98.3 F (36.8 C)   Ht 5' 9.5" (1.765 m)   Wt 128 lb 3.2 oz (58.2 kg)   SpO2 97%   BMI 18.66 kg/m  Body mass index is 18.66 kg/m. Physical Exam Vitals and nursing note reviewed.  Constitutional:      Appearance: Normal appearance. He is well-developed.  HENT:     Head: Normocephalic and atraumatic.     Right Ear: Tympanic membrane and external ear normal.     Left Ear: Tympanic membrane and external ear  normal.     Nose: Nose normal.     Mouth/Throat:     Mouth: Mucous membranes are moist.     Pharynx: Oropharynx is clear.     Comments: Edentulous on the top. Eyes:     Conjunctiva/sclera: Conjunctivae normal.  Cardiovascular:     Rate and Rhythm: Normal rate and regular rhythm.     Heart sounds: Normal heart sounds. No murmur heard.    No friction rub. No gallop.  Pulmonary:     Effort: Pulmonary effort is normal.     Breath sounds: Normal breath sounds. No wheezing, rhonchi or rales.  Musculoskeletal:     Cervical back: Neck supple.  Skin:    General: Skin is warm.     Findings: No rash.  Neurological:     Mental Status: He is alert and oriented to person, place, and time.  Psychiatric:        Behavior: Behavior normal.   The plan was reviewed with the patient/family, and all questions/concerned  were addressed.  It was my pleasure to see Demarlo today and participate in his care. Please feel free to contact me with any questions or concerns.  Sincerely,  Wyline Mood, DO Allergy & Immunology  [facility] office: [phone] [facility] office: [phone]

## 2023-04-27 NOTE — Patient Instructions (Addendum)
Hives Etiology unclear. Keep track of episodes. Take pictures of the rashes. Start zyrtec (cetirizine) 10mg  1-2 times per day. If symptoms are not controlled or causes drowsiness let us know. Start Pepcid (famotidine) 20mg  twice a day.  Avoid the following potential triggers: alcohol, tight clothing, NSAIDs, hot showers and getting overheated. See below for proper skin care.   Get bloodwork We are ordering labs, so please allow 1-2 weeks for the results to come back. With the newly implemented Cures Act, the labs might be visible to you at the same time that they become visible to me. However, I will not address the results until all of the results are back, so please be patient.   Return in about 4 weeks (around 05/25/2023). Or sooner if needed.  Skin care recommendations  Bath time: Always use lukewarm water. AVOID very hot or cold water. Keep bathing time to 5-10 minutes. Do NOT use bubble bath. Use a mild soap and use just enough to wash the dirty areas. Do NOT scrub skin vigorously.  After bathing, pat dry your skin with a towel. Do NOT rub or scrub the skin.  Moisturizers and prescriptions:  ALWAYS apply moisturizers immediately after bathing (within 3 minutes). This helps to lock-in moisture. Use the moisturizer several times a day over the whole body. Good summer moisturizers include: Aveeno, CeraVe, Cetaphil. Good winter moisturizers include: Aquaphor, Vaseline, Cerave, Cetaphil, Eucerin, Vanicream. When using moisturizers along with medications, the moisturizer should be applied about one hour after applying the medication to prevent diluting effect of the medication or moisturize around where you applied the medications. When not using medications, the moisturizer can be continued twice daily as maintenance.  Laundry and clothing: Avoid laundry products with added color or perfumes. Use unscented hypo-allergenic laundry products such as Tide free, Cheer free & gentle, and  All free and clear.  If the skin still seems dry or sensitive, you can try double-rinsing the clothes. Avoid tight or scratchy clothing such as wool. Do not use fabric softeners or dyer sheets.

## 2023-04-28 LAB — TSH: TSH: 1.257 u[IU]/mL (ref 0.350–4.500)

## 2023-05-03 LAB — ALPHA-GAL PANEL: IgE (Immunoglobulin E), Serum: 51 [IU]/mL (ref 6–495)

## 2023-05-03 LAB — FOOD ALLERGY PROFILE

## 2023-05-03 LAB — C-REACTIVE PROTEIN: CRP: 9 mg/L (ref 0–10)

## 2023-05-03 LAB — ALLERGENS W/TOTAL IGE AREA 2

## 2023-05-03 LAB — ENA+DNA/DS+SJORGEN'S
ENA RNP Ab: 0.5 AI (ref 0.0–0.9)
ENA SM Ab Ser-aCnc: <0.2 AI (ref 0.0–0.9)
ENA SSA (RO) Ab: <0.2 AI (ref 0.0–0.9)
ENA SSB (LA) Ab: <0.2 AI (ref 0.0–0.9)
dsDNA Ab: 243 [IU]/mL — ABNORMAL HIGH (ref 0–9)

## 2023-05-03 LAB — ANA W/REFLEX: Anti Nuclear Antibody (ANA): POSITIVE — AB

## 2023-05-03 LAB — TRYPTASE: Tryptase: 4.7 ug/L (ref 2.2–13.2)

## 2023-05-03 LAB — SEDIMENTATION RATE: Sed Rate: 43 mm/h — ABNORMAL HIGH (ref 0–30)

## 2023-05-03 LAB — CHRONIC URTICARIA: cu index: 1.8 (ref <10)

## 2023-05-09 NOTE — Progress Notes (Signed)
Please CALL patient.  I reviewed the bloodwork. Blood count, kidney function, liver function, electrolytes, thyroid,chronic urticaria index (checks for autoantibodies that trigger mast cells), tryptase (checks for mast cell issues) and alpha gal (checks for red meat allergy) were all normal which is great.   Inflammation markers slightly elevated. Negative to indoor/outdoor allergens.  Negative to common food panel.   Autoimmune screener was positive and dsDNA antibodies were high. This is sometimes seen in rheumatological conditions.  Concerning if this is what's causing you the rashes.  Please place referral to rheumatology for (positive ANA and dsDNA ab level of 243).   Keep appointment with me in January.

## 2023-05-10 ENCOUNTER — Other Ambulatory Visit: Payer: Self-pay

## 2023-05-18 ENCOUNTER — Inpatient Hospital Stay (HOSPITAL_BASED_OUTPATIENT_CLINIC_OR_DEPARTMENT_OTHER): Payer: Medicare HMO | Admitting: Hematology

## 2023-05-18 ENCOUNTER — Inpatient Hospital Stay: Payer: Medicare HMO

## 2023-05-18 ENCOUNTER — Inpatient Hospital Stay: Payer: Medicare HMO | Attending: Physician Assistant

## 2023-05-18 VITALS — BP 101/62 | HR 77 | Temp 97.4°F | Resp 18 | Ht 69.5 in | Wt 132.6 lb

## 2023-05-18 DIAGNOSIS — Z7901 Long term (current) use of anticoagulants: Secondary | ICD-10-CM | POA: Insufficient documentation

## 2023-05-18 DIAGNOSIS — C771 Secondary and unspecified malignant neoplasm of intrathoracic lymph nodes: Secondary | ICD-10-CM | POA: Insufficient documentation

## 2023-05-18 DIAGNOSIS — Z7189 Other specified counseling: Secondary | ICD-10-CM | POA: Diagnosis not present

## 2023-05-18 DIAGNOSIS — C3491 Malignant neoplasm of unspecified part of right bronchus or lung: Secondary | ICD-10-CM

## 2023-05-18 DIAGNOSIS — Z5112 Encounter for antineoplastic immunotherapy: Secondary | ICD-10-CM | POA: Insufficient documentation

## 2023-05-18 DIAGNOSIS — Z86718 Personal history of other venous thrombosis and embolism: Secondary | ICD-10-CM | POA: Diagnosis not present

## 2023-05-18 DIAGNOSIS — Z79899 Other long term (current) drug therapy: Secondary | ICD-10-CM | POA: Insufficient documentation

## 2023-05-18 DIAGNOSIS — D509 Iron deficiency anemia, unspecified: Secondary | ICD-10-CM | POA: Insufficient documentation

## 2023-05-18 DIAGNOSIS — Z87891 Personal history of nicotine dependence: Secondary | ICD-10-CM | POA: Diagnosis not present

## 2023-05-18 DIAGNOSIS — Z95828 Presence of other vascular implants and grafts: Secondary | ICD-10-CM

## 2023-05-18 LAB — CBC WITH DIFFERENTIAL (CANCER CENTER ONLY)
Abs Immature Granulocytes: 0.02 10*3/uL (ref 0.00–0.07)
Basophils Absolute: 0 10*3/uL (ref 0.0–0.1)
Basophils Relative: 1 %
Eosinophils Absolute: 0 10*3/uL (ref 0.0–0.5)
Eosinophils Relative: 1 %
HCT: 39.1 % (ref 39.0–52.0)
Hemoglobin: 13.3 g/dL (ref 13.0–17.0)
Immature Granulocytes: 0 %
Lymphocytes Relative: 18 %
Lymphs Abs: 1.1 10*3/uL (ref 0.7–4.0)
MCH: 29.6 pg (ref 26.0–34.0)
MCHC: 34 g/dL (ref 30.0–36.0)
MCV: 86.9 fL (ref 80.0–100.0)
Monocytes Absolute: 0.9 10*3/uL (ref 0.1–1.0)
Monocytes Relative: 14 %
Neutro Abs: 4.2 10*3/uL (ref 1.7–7.7)
Neutrophils Relative %: 66 %
Platelet Count: 376 10*3/uL (ref 150–400)
RBC: 4.5 MIL/uL (ref 4.22–5.81)
RDW: 13.1 % (ref 11.5–15.5)
WBC Count: 6.2 10*3/uL (ref 4.0–10.5)
nRBC: 0 % (ref 0.0–0.2)

## 2023-05-18 LAB — TSH: TSH: 1.793 u[IU]/mL (ref 0.350–4.500)

## 2023-05-18 LAB — CMP (CANCER CENTER ONLY)
ALT: 11 U/L (ref 0–44)
AST: 14 U/L — ABNORMAL LOW (ref 15–41)
Albumin: 4.1 g/dL (ref 3.5–5.0)
Alkaline Phosphatase: 133 U/L — ABNORMAL HIGH (ref 38–126)
Anion gap: 5 (ref 5–15)
BUN: 8 mg/dL (ref 6–20)
CO2: 29 mmol/L (ref 22–32)
Calcium: 9.6 mg/dL (ref 8.9–10.3)
Chloride: 104 mmol/L (ref 98–111)
Creatinine: 0.82 mg/dL (ref 0.61–1.24)
GFR, Estimated: >60 mL/min (ref >60)
Glucose, Bld: 104 mg/dL — ABNORMAL HIGH (ref 70–99)
Potassium: 4.2 mmol/L (ref 3.5–5.1)
Sodium: 138 mmol/L (ref 135–145)
Total Bilirubin: 0.4 mg/dL (ref 0.0–1.2)
Total Protein: 7.2 g/dL (ref 6.5–8.1)

## 2023-05-18 MED ORDER — DIPHENHYDRAMINE HCL 25 MG PO CAPS
25.0000 mg | ORAL_CAPSULE | Freq: Once | ORAL | Status: AC
  Administered 2023-05-18: 25 mg via ORAL
  Filled 2023-05-18: qty 1

## 2023-05-18 MED ORDER — SODIUM CHLORIDE 0.9% FLUSH
10.0000 mL | INTRAVENOUS | Status: DC | PRN
Start: 2023-05-18 — End: 2023-05-18
  Administered 2023-05-18: 10 mL

## 2023-05-18 MED ORDER — SODIUM CHLORIDE 0.9% FLUSH
10.0000 mL | INTRAVENOUS | Status: AC | PRN
  Administered 2023-05-18: 10 mL

## 2023-05-18 MED ORDER — SODIUM CHLORIDE 0.9 % IV SOLN
1200.0000 mg | Freq: Once | INTRAVENOUS | Status: AC
  Administered 2023-05-18: 1200 mg via INTRAVENOUS
  Filled 2023-05-18: qty 20

## 2023-05-18 MED ORDER — HEPARIN SOD (PORK) LOCK FLUSH 100 UNIT/ML IV SOLN
500.0000 [IU] | Freq: Once | INTRAVENOUS | Status: AC | PRN
  Administered 2023-05-18: 500 [IU]

## 2023-05-18 MED ORDER — FAMOTIDINE 20 MG PO TABS
20.0000 mg | ORAL_TABLET | Freq: Once | ORAL | Status: AC
  Administered 2023-05-18: 20 mg via ORAL
  Filled 2023-05-18: qty 1

## 2023-05-18 MED ORDER — ACETAMINOPHEN 325 MG PO TABS
650.0000 mg | ORAL_TABLET | Freq: Once | ORAL | Status: AC
  Administered 2023-05-18: 650 mg via ORAL
  Filled 2023-05-18: qty 2

## 2023-05-18 MED ORDER — SODIUM CHLORIDE 0.9 % IV SOLN: Freq: Once | INTRAVENOUS | Status: AC

## 2023-05-18 NOTE — Progress Notes (Deleted)
 David Berry. presents today for phlebotomy per MD orders. 16G phlebotomy kit used. Phlebotomy procedure started at 1412 and ended at 1419. 524 grams removed. Patient observed for 15 minutes after procedure without any incident.Pt provided with beverage.  Patient tolerated procedure well. IV needle removed intact.

## 2023-05-18 NOTE — Progress Notes (Signed)
 HEMATOLOGY/ONCOLOGY CLINIC NOTE  Date of Service: 05/18/23  Patient Care Team: Patient, No Pcp Per as PCP - General (General Practice)   CHIEF COMPLAINTS/PURPOSE OF CONSULTATION:  Follow-up for continued evaluation and management of metastatic lung adenocarcinoma  HISTORY OF PRESENTING ILLNESS:  Please see previous notes for details on initial presentation.  Current Treatment:  Atezolizumab  maintenance   INTERVAL HISTORY:   David Berry. Is a 58 y.o. male here for continued evaluation and management of his metastatic lung adenocarcinoma. He is here for cycle 23 day 1 of his atezolizumab  treatment.   Patient was last seen by me on 04/05/2023 and reported leg/back stiffness, multiple episodes of generalized hives, and mild occasional headaches and dizziness.   Today, he reports that he takes Zyrtec  daily and his rash/hives have resolved and not recurred.   Patient was seen by Luke Orlan HERO, DO on 04/27/2023 and was referred to rheumatology due to abnormal antibodies. Patient reports an upcoming appointment in May 2025.    He complains of intermittent arthritic pain/stiffness in the right hip and leg lower extremity, which has been present for 6-7 months. His discomfort does improve with movement. He reports that he previously slipped off of his trailer and landed onto the ground on his right side.   Patient reports a small nodule in his right neck. Patient reports an intermittent lump in his right axillary. He notes that he does not shave the area and does not use deodorant.   Patient reports that his breathing is stable. He denies any new headaches, new lumps/bumps, recent infections, abdominal pain, or leg swelling.  He reports that his hemorrhoids are not bothersome and he notes less hemorrhoidal bleeding.   Patient reports mild lower back stiffness, but denies any back pain otherwise.   Patient reports stable mild hand tingling and denies any major hand/leg  swelling. He has no skin rashes at this time.   MEDICAL HISTORY:  Past Medical History:  Diagnosis Date   Anxiety    Bipolar disorder (HCC)    Blood in stool    Cancer (HCC)    Chronic bronchitis with emphysema (HCC)    pt denies this   Eczema    GERD (gastroesophageal reflux disease)    Lung cancer (HCC) dx'd 01/2019   Peripheral vascular disease (HCC)    blood clot in neck Sept 12, 2020    SURGICAL HISTORY: Past Surgical History:  Procedure Laterality Date   APPENDECTOMY     teenager   INGUINAL HERNIA REPAIR Right 2016, 2017   [facility], [facility]-removed mesh   IR IMAGING GUIDED PORT INSERTION  04/26/2019   TONSILLECTOMY     TOOTH EXTRACTION N/A 03/14/2020   Procedure: DENTAL RESTORATION/EXTRACTIONS;  Surgeon: Sheryle Hamilton, DDS;  Location: [facility] OR;  Service: Oral Surgery;  Laterality: N/A;    SOCIAL HISTORY: Social History   Socioeconomic History   Marital status: Single    Spouse name: Not on file   Number of children: 1   Years of education: GED   Highest education level: Some college, no degree  Occupational History    Comment: fork lift driver  Tobacco Use   Smoking status: Former    Types: Cigars    Quit date: 01/20/2019    Years since quitting: 4.3    Passive exposure: Current   Smokeless tobacco: Never   Tobacco comments:    smokes 3 black and mild cigars per day  Vaping Use  Vaping status: Never Used  Substance and Sexual Activity   Alcohol use: Not Currently   Drug use: No   Sexual activity: Yes  Other Topics Concern   Not on file  Social History Narrative   Lives alone   Caffeine- coffee, 20 oz daily, tea occas   Social Drivers of Health   Financial Resource Strain: Low Risk  (09/29/2020)   Overall Financial Resource Strain (CARDIA)    Difficulty of Paying Living Expenses: Not very hard  Food Insecurity: No Food Insecurity (09/29/2020)   Hunger Vital Sign    Worried About Running Out of Food in the Last Year: Never true     Ran Out of Food in the Last Year: Never true  Transportation Needs: No Transportation Needs (09/29/2020)   PRAPARE - Administrator, Civil Service (Medical): No    Lack of Transportation (Non-Medical): No  Physical Activity: Sufficiently Active (09/29/2020)   Exercise Vital Sign    Days of Exercise per Week: 3 days    Minutes of Exercise per Session: 150+ min  Stress: No Stress Concern Present (09/29/2020)   [employer] of Occupational Health - Occupational Stress Questionnaire    Feeling of Stress : Not at all  Social Connections: Unknown (03/01/2022)   [facility], [facility]   Social Network    Social Network: Not on file  Intimate Partner Violence: Unknown (03/01/2022)   Received from [facility], [facility]   HITS    Physically Hurt: Not on file    Insult or Talk Down To: Not on file    Threaten Physical Harm: Not on file    Scream or Curse: Not on file    FAMILY HISTORY: Family History  Problem Relation Age of Onset   Prostate cancer Father    Colon cancer Neg Hx    Stomach cancer Neg Hx    Esophageal cancer Neg Hx     ALLERGIES:  is allergic to atorvastatin calcium.  MEDICATIONS:  Current Outpatient Medications  Medication Sig Dispense Refill   apixaban  (ELIQUIS ) 2.5 MG TABS tablet Take 1 tablet (2.5 mg total) by mouth 2 (two) times daily. 60 tablet 5   cetirizine  (ZYRTEC  ALLERGY ) 10 MG tablet Take 1 tablet (10 mg total) by mouth 2 (two) times daily as needed (hives). 60 tablet 1   Cyanocobalamin (VITAMIN B 12 PO) Take 2,500 mcg by mouth daily.     esomeprazole  (NEXIUM ) 40 MG capsule TAKE 1 CAPSULE(40 MG) BY MOUTH DAILY 90 capsule 2   famotidine  (PEPCID ) 20 MG tablet Take 1 tablet (20 mg total) by mouth 2 (two) times daily. 60 tablet 1   famotidine  (PEPCID ) 40 MG tablet SMARTSIG:1 Tablet(s) By Mouth Every Evening     lidocaine -prilocaine  (EMLA ) cream Apply 1 Application topically as needed. 30 g 0   ondansetron  (ZOFRAN ) 4 MG  tablet Take 1 tablet (4 mg total) by mouth every 8 (eight) hours as needed for nausea or vomiting. 30 tablet 1   propranolol  (INDERAL ) 20 MG tablet TAKE 1 TABLET(20 MG) BY MOUTH TWICE DAILY 60 tablet 5   sildenafil  (VIAGRA ) 50 MG tablet Take 1 tablet (50 mg total) by mouth daily as needed for erectile dysfunction. Take 30 mins to 4 hours prior to sexual activity 30 tablet 1   No current facility-administered medications for this visit.   REVIEW OF SYSTEMS:    10 Point review of Systems was done is negative except as noted above.   PHYSICAL EXAMINATION: ECOG  FS:1 - Symptomatic but completely ambulatory .BP 101/62 (BP Location: Left Arm, Patient Position: Sitting)   Pulse 77   Temp (!) 97.4 F (36.3 C) (Tympanic)   Resp 18   Ht 5' 9.5 (1.765 m)   Wt 132 lb 9.6 oz (60.1 kg)   SpO2 100%   BMI 19.30 kg/m   Wt Readings from Last 3 Encounters:  05/18/23 132 lb 9.6 oz (60.1 kg)  04/27/23 131 lb (59.4 kg)  04/27/23 128 lb 3.2 oz (58.2 kg)   Body mass index is 19.3 kg/m.     GENERAL:alert, in no acute distress and comfortable SKIN: no acute rashes, no significant lesions EYES: conjunctiva are pink and non-injected, sclera anicteric OROPHARYNX: MMM, no exudates, no oropharyngeal erythema or ulceration NECK: supple, no JVD LYMPH:  no palpable lymphadenopathy in the cervical, axillary or inguinal regions LUNGS: clear to auscultation b/l with normal respiratory effort HEART: regular rate & rhythm ABDOMEN:  normoactive bowel sounds , non tender, not distended. Extremity: no pedal edema PSYCH: alert & oriented x 3 with fluent speech NEURO: no focal motor/sensory deficits   LABORATORY DATA:  I have reviewed the data as listed    Latest Ref Rng & Units 05/18/2023    1:06 PM 04/27/2023    2:42 PM 04/05/2023   11:38 AM  CBC  WBC 4.0 - 10.5 K/uL 6.2  6.2  5.1   Hemoglobin 13.0 - 17.0 g/dL 86.6  86.8  87.3   Hematocrit 39.0 - 52.0 % 39.1  38.5  39.4   Platelets 150 - 400 K/uL 376   346  288       Latest Ref Rng & Units 05/18/2023    1:06 PM 04/27/2023    2:42 PM 04/05/2023   11:38 AM  CMP  Glucose 70 - 99 mg/dL 895  890  881   BUN 6 - 20 mg/dL 8  9  11    Creatinine 0.61 - 1.24 mg/dL 9.17  9.11  9.09   Sodium 135 - 145 mmol/L 138  138  138   Potassium 3.5 - 5.1 mmol/L 4.2  3.8  4.3   Chloride 98 - 111 mmol/L 104  105  104   CO2 22 - 32 mmol/L 29  28  30    Calcium 8.9 - 10.3 mg/dL 9.6  9.7  9.6   Total Protein 6.5 - 8.1 g/dL 7.2  7.1  6.9   Total Bilirubin 0.0 - 1.2 mg/dL 0.4  0.4  0.4   Alkaline Phos 38 - 126 U/L 133  130  106   AST 15 - 41 U/L 14  15  14    ALT 0 - 44 U/L 11  10  10     Lab Results  Component Value Date   IRON  16 (L) 05/05/2022   TIBC 448 05/05/2022   FERRITIN 52 10/20/2022    01/22/2019 [facility]: Tumor Mutational Burden     01/22/2019 PD-L1 Immunohistochemistry Analysis    01/22/2019 Soft Tissue Needle Core Biopsy Surgical Pathology     RADIOGRAPHIC STUDIES: I have personally reviewed the radiological images as listed and agreed with the findings in the report. MR ABDOMEN WWO CONTRAST Result Date: 04/24/2023 CLINICAL DATA:  Enhancing right renal lesion, history of lung cancer EXAM: MRI ABDOMEN WITHOUT AND WITH CONTRAST TECHNIQUE: Multiplanar multisequence MR imaging of the abdomen was performed both before and after the administration of intravenous contrast. CONTRAST:  6 mL Vueway  gadolinium contrast IV COMPARISON:  CT chest abdomen pelvis, 03/28/2023 FINDINGS: Lower  chest: No acute abnormality. Hepatobiliary: No solid liver abnormality is seen. Definitively benign flash filling hemangioma and portosystemic shunt in the posterior liver dome, hepatic segment VII (series 12, image 20) as well as in the superior left lobe of the liver, hepatic segment IV a. No suspicious liver lesions no gallstones, gallbladder wall thickening, or biliary dilatation. Pancreas: Unremarkable. No pancreatic ductal dilatation or surrounding inflammatory  changes. Spleen: Normal in size without significant abnormality. Adrenals/Urinary Tract: Small right adrenal nodule with macroscopic fat content (series 5, image 63). Heterogeneously enhancing mass with significant macroscopic fat content in the peripheral midportion of the right kidney measuring 1.6 x 1.4 cm (series 16, image 45, series 5, image 56). The left kidney is normal. No calculi or hydronephrosis. Stomach/Bowel: Stomach is within normal limits. No evidence of bowel wall thickening, distention, or inflammatory changes. Vascular/Lymphatic: No significant vascular findings are present. No enlarged abdominal lymph nodes. Other: No abdominal wall hernia or abnormality. No ascites. Musculoskeletal: No acute or significant osseous findings. IMPRESSION: 1. Heterogeneously enhancing mass with significant macroscopic fat content in the peripheral midportion of the right kidney measuring 1.6 x 1.4 cm, most consistent with a benign angiomyolipoma and stable over numerous prior examinations. 2. Small right adrenal nodule with macroscopic fat content, consistent with a benign adrenal adenoma, likewise stable over numerous prior examinations. 3. No evidence of lymphadenopathy or metastatic disease in the abdomen. Electronically Signed   By: Marolyn JONETTA Jaksch M.D.   On: 04/24/2023 22:19     ASSESSMENT & PLAN:   This is a 58 year old male with   1. Metastatic Poorly differentiated lung adenocarcinoma Presented with Large neck mass, mediastinal mass, renal mass, and questionable bone lesions  no brain mets on MRI brain 01/22/2019 PD-L1 Immunohistochemistry Analysis which revealed Tumor Proportion Score (TPS) 50% 05/16/2019 PET/CT ([id number]) revealed Radiation changes in the right hemithorax, as above. Improving mediastinal nodal metastases. Prior bulky right cervical metastases have resolved. No evidence of metastatic disease in the abdomen/pelvis. 07/19/2019 Esophagus Scan ([id number]) revealed Mild  stricture at the C6-7 level likely related to prior esophageal surgery. Barium tablet was slow to pass through this area but did pass through after 1 minutes. Hiatal hernia with moderate gastroesophageal reflux. Moderate stricture above the hiatal hernia likely due to reflux. Barium tablet did not pass this area. There are changes of esophagitis which are likely due to reflux and possibly radiation as well.  10/10/19 of  CT Chest W Contrast ([id number])- no evidence of lung cancer progression at this time. He did have a PET CT scan on 04/13/2021 which was reviewed on the previous visit by Johnston.  This showed some borderline FDG avid right paratracheal right hilar and azygoesophageal lymph nodes that were suspicious for possible nodal recurrence however the patient notes that he did have a bad upper respiratory tract infection around the time that he had his PET CT scan and therefore reactive adenopathy is a possibility as well.    2. 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 Symptomatic hemorrhoids-currently no significant bleeding but have caused some mild iron  deficiency anemia.  5. Normocytic anemia -Likely due to underlying malignancy + Iron  deficiency due to ongoing bleeding from hemorrhoids   6. S/pThrombocytosis -- likely due to paraneoplastic effect of tumor and reactive due to inflammation and tissue inflammation from RT.-- now resolved.   7. Nicotine dependence -  has quit since cancer diagnosis  8. Iron  deficiency - likely due to blood loss from hemorrhoids  9.  Erectile dysfunction-having partial benefits with using Viagra .  PLAN:   -discussed finding of benign lesion found on recent MRI of the abdomen -Discussed lab results on 05/18/23 in detail with patient. CBC showed WBC of 6.2K, hemoglobin of 13.3, and platelets of 376K. -Patient has no  lab or clinical evidence of lung cancer recurrence/progression at this time. -patient has no notable toxicities with immunotherapy at this time -continue Atezolizumab  immunotherapy -continue Eliquis  2.5mg  BID today. -patient currently takes Zyrtec  daily which has controlled his skin rashes -educated patient that it would be possible for there to be a tendency to have an internal trigger for skin rashes -educated patient that immunotherapy stimulates the immune system and may cause skin rashes. We shall continue immunotherapy at this time.  -educated patient that the use of steroids to help inflammation would reverse results of immunotherapy and would not be recommended -patient shall proceed with upcoming Rheumatology appointment -answered all of patient's questions in detail  FOLLOW UP: Please schedule next 3 doses of atezolizumab  with port flush and labs. MD visit every 6 weeks I.e. every other treatment. thx  The total time spent in the appointment was 30 minutes* .  All of the patient's questions were answered with apparent satisfaction. The patient knows to call the clinic with any problems, questions or concerns.   Emaline Saran MD MS AAHIVMS [facility] Hematology/Oncology Physician [facility]  .*Total Encounter Time as defined by the Centers for Medicare and Medicaid Services includes, in addition to the face-to-face time of a patient visit (documented in the note above) non-face-to-face time: obtaining and reviewing outside history, ordering and reviewing medications, tests or procedures, care coordination (communications with other health care professionals or caregivers) and documentation in the medical record.    I,Mitra Faeizi,acting as a neurosurgeon for Emaline Saran, MD.,have documented all relevant documentation on the behalf of Emaline Saran, MD,as directed by  Emaline Saran, MD while in the presence of Emaline Saran, MD.  .I have reviewed the above documentation for  accuracy and completeness, and I agree with the above. .Seniya Stoffers Kishore Khalib Fendley MD

## 2023-05-18 NOTE — Addendum Note (Signed)
 Addended by: Rolanda Jay on: 05/18/2023 04:03 PM   Modules accepted: Orders

## 2023-05-18 NOTE — Patient Instructions (Signed)
 [facility] - A DEPT OF [facility]  Discharge Instructions: Thank you for choosing [facility] to provide your oncology and hematology care.   If you have a lab appointment with the Cancer Center, please go directly to the Cancer Center and check in at the registration area.   Wear comfortable clothing and clothing appropriate for easy access to any Portacath or PICC line.   We strive to give you quality time with your provider. You may need to reschedule your appointment if you arrive late (15 or more minutes).  Arriving late affects you and other patients whose appointments are after yours.  Also, if you miss three or more appointments without notifying the office, you may be dismissed from the clinic at the provider's discretion.      For prescription refill requests, have your pharmacy contact our office and allow 72 hours for refills to be completed.    Today you received the following chemotherapy and/or immunotherapy agents TECENTRIQ       To help prevent nausea and vomiting after your treatment, we encourage you to take your nausea medication as directed.  BELOW ARE SYMPTOMS THAT SHOULD BE REPORTED IMMEDIATELY: *FEVER GREATER THAN 100.4 F (38 C) OR HIGHER *CHILLS OR SWEATING *NAUSEA AND VOMITING THAT IS NOT CONTROLLED WITH YOUR NAUSEA MEDICATION *UNUSUAL SHORTNESS OF BREATH *UNUSUAL BRUISING OR BLEEDING *URINARY PROBLEMS (pain or burning when urinating, or frequent urination) *BOWEL PROBLEMS (unusual diarrhea, constipation, pain near the anus) TENDERNESS IN MOUTH AND THROAT WITH OR WITHOUT PRESENCE OF ULCERS (sore throat, sores in mouth, or a toothache) UNUSUAL RASH, SWELLING OR PAIN  UNUSUAL VAGINAL DISCHARGE OR ITCHING   Items with * indicate a potential emergency and should be followed up as soon as possible or go to the Emergency Department if any problems should occur.  Please show the CHEMOTHERAPY ALERT CARD or IMMUNOTHERAPY  ALERT CARD at check-in to the Emergency Department and triage nurse.  Should you have questions after your visit or need to cancel or reschedule your appointment, please contact [facility] - A DEPT OF [facility]  Dept: [phone]  and follow the prompts.  Office hours are 8:00 a.m. to 4:30 p.m. Monday - Friday. Please note that voicemails left after 4:00 p.m. may not be returned until the following business day.  We are closed weekends and major holidays. You have access to a nurse at all times for urgent questions. Please call the main number to the clinic Dept: [phone] and follow the prompts.   For any non-urgent questions, you may also contact your provider using MyChart. We now offer e-Visits for anyone 59 and older to request care online for non-urgent symptoms. For details visit [email or web address].   Also download the MyChart app! Go to the app store, search MyChart, open the app, select [place], and log in with your MyChart username and password.

## 2023-05-23 ENCOUNTER — Encounter: Payer: Self-pay | Admitting: Hematology

## 2023-05-23 ENCOUNTER — Encounter: Payer: Self-pay | Admitting: Physician Assistant

## 2023-05-24 ENCOUNTER — Encounter: Payer: Self-pay | Admitting: Physician Assistant

## 2023-05-24 ENCOUNTER — Encounter: Payer: Self-pay | Admitting: Hematology

## 2023-05-24 NOTE — Progress Notes (Signed)
 Follow Up Note  RE: David Berry. MRN: [id number] DOB: 09/26/1965 Date of Office Visit: 05/25/2023  Referring provider: Lorella Roles, MD Primary care provider: Patient, No Pcp Per  Chief Complaint: Urticaria (Wants to discuss recent labs.)  History of Present Illness: I had the pleasure of seeing David Berry for a follow up visit at the [facility] on 05/25/2023. He is a 58 y.o. male, who is being followed for urticaria, atopic dermatitis and chronic rhinitis. His previous allergy  office visit was on 04/27/2023 with Dr. Burdette Carolin. Today is a regular follow up visit.  Discussed the use of AI scribe software for clinical note transcription with the patient, who gave verbal consent to proceed.  The patient, with hives/rash, has been managing his condition with twice-daily doses of Zyrtec  and Famotidine . He reports no recent breakouts, even when a dose was missed, and denies any associated itchiness. However, he notes that the medication regimen causes fatigue.  Blood work revealed abnormal autoimmune markers, specifically ANA and double-stranded DNA, raising concerns about potential rheumatological conditions.   The patient also reports a worsening reaction to mosquito bites, with significant swelling at the site of the bite.   Other than these issues, the patient reports good overall health.     Assessment and Plan: David Berry is a 58 y.o. male with: Urticaria Past history - daily hives for the past month, worsening over the past year. Raised, red, itchy lesions on the torso and legs that resolve within 24 hours. No clear triggers identified. Medical history significant for stage 4 lung cancer under the care of oncology. Interim history - 2024 labs (positive ANA and dsDNA ab level of 243, rest unremarkable). No current breakouts. Keep rheumatology appointment in May. Etiology unclear.  Keep track of episodes. Take pictures of the rashes. Continue zyrtec   (cetirizine ) 10mg  1-2 times per day. If symptoms are not controlled or causes drowsiness let us  know. Continue Pepcid  (famotidine ) 20mg  twice a day.  Avoid the following potential triggers: alcohol, tight clothing, NSAIDs, hot showers and getting overheated. Continue proper skin care.   Step down schedule: Decrease Pepcid  (famotidine ) 20mg  to once a day. Continue zyrtec  (cetirizine ) 10mg  twice a day. If no symptoms for 2 weeks then: Decrease zyrtec  10mg  to once a day. Continue Pepcid  20mg  once a day. If no symptoms for 2 weeks then: Stop Pepcid  20mg . Continue zyrtec  10mg  once a day. If you have issues then take the dose where you had no issues.   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 No available testing or specific treatment. See handout for mosquito bite management.   Return in about 5 months (around 10/23/2023).  No orders of the defined types were placed in this encounter.  Lab Orders  No laboratory test(s) ordered today    Diagnostics: None.   Medication List:  Current Outpatient Medications  Medication Sig Dispense Refill   apixaban  (ELIQUIS ) 2.5 MG TABS tablet Take 1 tablet (2.5 mg total) by mouth 2 (two) times daily. 60 tablet 5   cetirizine  (ZYRTEC  ALLERGY ) 10 MG tablet Take 1 tablet (10 mg total) by mouth 2 (two) times daily as needed (hives). 60 tablet 1   Cyanocobalamin (VITAMIN B 12 PO) Take 2,500 mcg by mouth daily.     esomeprazole  (NEXIUM ) 40 MG capsule TAKE 1 CAPSULE(40 MG) BY MOUTH DAILY 90 capsule 2   famotidine  (PEPCID ) 20 MG tablet Take 1 tablet (20 mg total) by mouth 2 (two) times daily. 60 tablet  1   lidocaine -prilocaine  (EMLA ) cream Apply 1 Application topically as needed. 30 g 0   ondansetron  (ZOFRAN ) 4 MG tablet Take 1 tablet (4 mg total) by mouth every 8 (eight) hours as needed for nausea or vomiting. 30 tablet 1   propranolol  (INDERAL ) 20 MG tablet TAKE 1 TABLET(20 MG) BY MOUTH TWICE DAILY 60 tablet 5   sildenafil  (VIAGRA ) 50 MG  tablet Take 1 tablet (50 mg total) by mouth daily as needed for erectile dysfunction. Take 30 mins to 4 hours prior to sexual activity 30 tablet 1   No current facility-administered medications for this visit.   Facility-Administered Medications Ordered in Other Visits  Medication Dose Route Frequency Provider Last Rate Last Admin   sodium chloride  flush (NS) 0.9 % injection 10 mL  10 mL Intracatheter PRN Frankie Israel, MD   10 mL at 05/18/23 1559   Allergies: Allergies  Allergen Reactions   Atorvastatin Calcium Rash    All statins   I reviewed his past medical history, social history, family history, and environmental history and no significant changes have been reported from his previous visit.  Review of Systems  Constitutional:  Negative for appetite change, chills, fever and unexpected weight change.  HENT:  Negative for congestion and rhinorrhea.   Eyes:  Negative for itching.  Respiratory:  Negative for cough, chest tightness, shortness of breath and wheezing.   Cardiovascular:  Negative for chest pain.  Gastrointestinal:  Negative for abdominal pain.  Genitourinary:  Negative for difficulty urinating.  Musculoskeletal:  Positive for arthralgias.  Skin:  Negative for rash.  Neurological:  Negative for headaches.    Objective: BP (!) 80/60   Pulse 75   Temp 97.9 F (36.6 C)   Resp 12   Ht 5' 9.5" (1.765 m)   Wt 130 lb 3.2 oz (59.1 kg)   SpO2 98%   BMI 18.95 kg/m  Body mass index is 18.95 kg/m. Physical Exam Vitals and nursing note reviewed.  Constitutional:      Appearance: Normal appearance. He is well-developed.  HENT:     Head: Normocephalic and atraumatic.     Right Ear: Tympanic membrane and external ear normal.     Left Ear: Tympanic membrane and external ear normal.     Nose: Nose normal.     Mouth/Throat:     Mouth: Mucous membranes are moist.     Pharynx: Oropharynx is clear.     Comments: Edentulous on the top. Eyes:      Conjunctiva/sclera: Conjunctivae normal.  Cardiovascular:     Rate and Rhythm: Normal rate and regular rhythm.     Heart sounds: Normal heart sounds. No murmur heard.    No friction rub. No gallop.  Pulmonary:     Effort: Pulmonary effort is normal.     Breath sounds: Normal breath sounds. No wheezing, rhonchi or rales.  Musculoskeletal:     Cervical back: Neck supple.  Skin:    General: Skin is warm.     Findings: No rash.  Neurological:     Mental Status: He is alert and oriented to person, place, and time.  Psychiatric:        Behavior: Behavior normal.    Previous notes and tests were reviewed. The plan was reviewed with the patient/family, and all questions/concerned were addressed.  It was my pleasure to see Willis today and participate in his care. Please feel free to contact me with any questions or concerns.  Sincerely,  Eudelia Hero, DO  Allergy  & Immunology  [facility]  [place] office: [phone] [facility] office: [phone]

## 2023-05-25 ENCOUNTER — Encounter: Payer: Self-pay | Admitting: Allergy

## 2023-05-25 ENCOUNTER — Ambulatory Visit: Payer: Medicare HMO | Admitting: Allergy

## 2023-05-25 ENCOUNTER — Other Ambulatory Visit: Payer: Self-pay

## 2023-05-25 VITALS — BP 80/60 | HR 75 | Temp 97.9°F | Resp 12 | Ht 69.5 in | Wt 130.2 lb

## 2023-05-25 DIAGNOSIS — L508 Other urticaria: Secondary | ICD-10-CM

## 2023-05-25 DIAGNOSIS — L2089 Other atopic dermatitis: Secondary | ICD-10-CM

## 2023-05-25 DIAGNOSIS — J310 Chronic rhinitis: Secondary | ICD-10-CM

## 2023-05-25 NOTE — Patient Instructions (Addendum)
 Hives/rash  Keep rheumatology appointment in May.  Keep track of episodes. Take pictures of the rashes. Continue zyrtec  (cetirizine ) 10mg  1-2 times per day. If symptoms are not controlled or causes drowsiness let us  know. Continue Pepcid  (famotidine ) 20mg  twice a day.  Avoid the following potential triggers: alcohol, tight clothing, NSAIDs, hot showers and getting overheated. Continue proper skin care.   Step down schedule: Decrease Pepcid  (famotidine ) 20mg  to once a day. Continue zyrtec  (cetirizine ) 10mg  twice a day. If no symptoms for 2 weeks then: Decrease zyrtec  10mg  to once a day. Continue Pepcid  20mg  once a day. If no symptoms for 2 weeks then: Stop Pepcid  20mg . Continue zyrtec  10mg  once a day. If you have issues then take the dose where you had no issues.   Mosquitos See below for management.   Return in about 5 months (around 10/23/2023). Or sooner if needed.  Skeeter Syndrome Treatment    Mosquito avoidance (see information below) Ice affected area Oral antihistamine (Benadryl  or Zyrtec ) Oral anti-inflammatory (ibuprofen ) Topical corticosteroid (Hydrocortisone  cream 1%)      Strategies for Safer Mosquito Avoidance  by Deeann Fare    Mosquitoes are a terrible nuisance in the muggy summer months, especially now that the ferocious Asian tiger mosquito has made a permanent home here in [place] . The arrival of [place] Nile virus has added some urgency to mosquito control measures, but spray programs and many repellents may do more harm than good in the long term. Choosing the least-toxic solutions can protect both your health and comfort in mosquito season. Here are some suggestions for safer and more effective bite avoidance this summer.    Population Control  Keeping mosquito populations in check is the most important way to avoid bites. It's no secret that removing sources of standing water is crucial to eliminating mosquito breeding grounds. Common breeding sites to  watch for include:  * Rain gutters. Clean them out and offer to do the same for elderly neighbors or others who may not be able to do the job themselves. Remember that mosquito control is a community-wide effort.  * Flowerpots, buckets and old tires. Be sure empty containers cannot hold water.  * Bird baths and pet dishes. Empty and clean them weekly.  * Recycling bins and the cans inside. These may harbor  stagnant water if not emptied regularly.  * Rain barrels. Be sure they are sealed off from mosquitoes.  * Storm drains. Watch for clogs from branches and garbage.  Insecticide sprays targeting adult mosquitoes can only reduce mosquito populations for a day or two. In fact, since insecticides also kill off important mosquito predators such as dragonflies, a spray program can actually be counter-productive by leaving the rebounding mosquito population without natural enemies.  Instead, interrupt the breeding cycle by using the nontoxic bacterial larvicide Bacillus thuringiensis var. israelensis (Bti). Bti is sold in convenient donuts called "mosquito dunks" that you can safely use in your bird bath, rain barrel or low areas around your yard to kill mosquito larvae before the adults emerge and spread throughout the community, where they become much harder to kill. Bti is not harmful to fish, birds or mammals, and single applications can remain effective for a month or more, even if the water source dries out and refills.    Safer Repellents  If you'll be outdoors at dawn or dusk when mosquitoes are most active, wear long clothes that don't leave skin exposed. (You may use insect repellent on your clothes). When you do  get bites, soothe them by slathering on an astringent such as witch hazel after you come inside -- it will prevent scratching and allow bites to heal quickly.  Lately many public health officials concerned about [place] Nile virus have been advising people to use repellents containing the  pesticide DEET (N,N-diethyl-meta-toluamide). While DEET is an extremely effective mosquito repellent, it is also a neurotoxin, and studies have shown that prolonged frequent exposure can irritate skin, cause muscle twitching and weakness and harm the brain and nervous system, especially when combined with other pesticides such as permethrin.  Consumer studies report that [employer]'s Skin-So-Soft and herbal repellents containing citronella can be just as effective as DEET at repelling mosquitoes but need to be applied more often. The solution is to choose the safer formulas and reapply as needed.  General guidelines for using any insect repellent:  * Choose oils or lotions rather than sprays, which produce fine particles that are easily inhaled.  * Do not apply repellents to broken skin.  * Do not allow children to apply their own repellent, and do not apply repellents containing DEET or other pesticides directly to children's skin. If you use such products, they can be applied to children's clothing instead.  * Do not use sunscreen/repellent combinations. Sunscreen needs to be reapplied more often than repellents, so the combination products can result in overexposure to pesticides.  * Wash off all repellent from skin and clothing immediately after coming indoors.  Area-wide repellent strategies can also be effective for outdoor gatherings. There are various contraptions available that emit carbon dioxide to trap mosquitoes (such as the Mosquito Magnet and Mosquito Deleto). These are expensive, but they do work, and some companies will even rent them to you for an outdoor event. Citronella candles are also effective when there is no breeze, but beware of candles containing pesticides -- the smoke is easily inhaled and can irritate the airway. Placing fans around your porch or patio can blow mosquitoes away.  Keep in mind that only male mosquitoes actually bite and that most mosquito species in this area do not  transmit [place] Nile virus. You are most at risk of being bitten by a mosquito carrying the disease at dawn and dusk, and even in these cases your chances of actually contracting the virus are extremely low. So take sensible steps to keep the buggers under control, but also keep them in perspective as the annoyances they are.

## 2023-05-27 ENCOUNTER — Encounter: Payer: Self-pay | Admitting: Hematology

## 2023-05-27 ENCOUNTER — Encounter: Payer: Self-pay | Admitting: Physician Assistant

## 2023-06-01 ENCOUNTER — Encounter: Payer: Self-pay | Admitting: Hematology

## 2023-06-01 ENCOUNTER — Encounter: Payer: Self-pay | Admitting: Physician Assistant

## 2023-06-08 ENCOUNTER — Inpatient Hospital Stay: Payer: Medicare HMO

## 2023-06-08 ENCOUNTER — Inpatient Hospital Stay (HOSPITAL_BASED_OUTPATIENT_CLINIC_OR_DEPARTMENT_OTHER): Payer: Medicare HMO | Admitting: Hematology

## 2023-06-08 VITALS — BP 96/67 | HR 71 | Temp 98.5°F | Resp 16 | Ht 69.5 in | Wt 130.4 lb

## 2023-06-08 DIAGNOSIS — C3491 Malignant neoplasm of unspecified part of right bronchus or lung: Secondary | ICD-10-CM

## 2023-06-08 DIAGNOSIS — L509 Urticaria, unspecified: Secondary | ICD-10-CM | POA: Diagnosis not present

## 2023-06-08 DIAGNOSIS — Z7189 Other specified counseling: Secondary | ICD-10-CM

## 2023-06-08 DIAGNOSIS — Z95828 Presence of other vascular implants and grafts: Secondary | ICD-10-CM

## 2023-06-08 DIAGNOSIS — Z5112 Encounter for antineoplastic immunotherapy: Secondary | ICD-10-CM | POA: Diagnosis not present

## 2023-06-08 LAB — CMP (CANCER CENTER ONLY)
ALT: 8 U/L (ref 0–44)
AST: 13 U/L — ABNORMAL LOW (ref 15–41)
Albumin: 4 g/dL (ref 3.5–5.0)
Alkaline Phosphatase: 117 U/L (ref 38–126)
Anion gap: 5 (ref 5–15)
BUN: 10 mg/dL (ref 6–20)
CO2: 28 mmol/L (ref 22–32)
Calcium: 9.3 mg/dL (ref 8.9–10.3)
Chloride: 105 mmol/L (ref 98–111)
Creatinine: 0.81 mg/dL (ref 0.61–1.24)
GFR, Estimated: >60 mL/min (ref >60)
Glucose, Bld: 113 mg/dL — ABNORMAL HIGH (ref 70–99)
Potassium: 4 mmol/L (ref 3.5–5.1)
Sodium: 138 mmol/L (ref 135–145)
Total Bilirubin: 0.4 mg/dL (ref 0.0–1.2)
Total Protein: 7 g/dL (ref 6.5–8.1)

## 2023-06-08 LAB — CBC WITH DIFFERENTIAL (CANCER CENTER ONLY)
Abs Immature Granulocytes: 0.01 10*3/uL (ref 0.00–0.07)
Basophils Absolute: 0 10*3/uL (ref 0.0–0.1)
Basophils Relative: 1 %
Eosinophils Absolute: 0 10*3/uL (ref 0.0–0.5)
Eosinophils Relative: 1 %
HCT: 39.1 % (ref 39.0–52.0)
Hemoglobin: 12.5 g/dL — ABNORMAL LOW (ref 13.0–17.0)
Immature Granulocytes: 0 %
Lymphocytes Relative: 18 %
Lymphs Abs: 0.9 10*3/uL (ref 0.7–4.0)
MCH: 28 pg (ref 26.0–34.0)
MCHC: 32 g/dL (ref 30.0–36.0)
MCV: 87.7 fL (ref 80.0–100.0)
Monocytes Absolute: 0.8 10*3/uL (ref 0.1–1.0)
Monocytes Relative: 15 %
Neutro Abs: 3.3 10*3/uL (ref 1.7–7.7)
Neutrophils Relative %: 65 %
Platelet Count: 379 10*3/uL (ref 150–400)
RBC: 4.46 MIL/uL (ref 4.22–5.81)
RDW: 13.2 % (ref 11.5–15.5)
WBC Count: 5 10*3/uL (ref 4.0–10.5)
nRBC: 0 % (ref 0.0–0.2)

## 2023-06-08 LAB — TSH: TSH: 0.825 u[IU]/mL (ref 0.350–4.500)

## 2023-06-08 MED ORDER — ACETAMINOPHEN 325 MG PO TABS
650.0000 mg | ORAL_TABLET | Freq: Once | ORAL | Status: AC
  Administered 2023-06-08: 650 mg via ORAL
  Filled 2023-06-08: qty 2

## 2023-06-08 MED ORDER — HEPARIN SOD (PORK) LOCK FLUSH 100 UNIT/ML IV SOLN
500.0000 [IU] | Freq: Once | INTRAVENOUS | Status: AC | PRN
  Administered 2023-06-08: 500 [IU]

## 2023-06-08 MED ORDER — DIPHENHYDRAMINE HCL 25 MG PO CAPS
25.0000 mg | ORAL_CAPSULE | Freq: Once | ORAL | Status: AC
  Administered 2023-06-08: 25 mg via ORAL
  Filled 2023-06-08: qty 1

## 2023-06-08 MED ORDER — SODIUM CHLORIDE 0.9% FLUSH
10.0000 mL | INTRAVENOUS | Status: DC | PRN
  Administered 2023-06-08: 10 mL

## 2023-06-08 MED ORDER — SILDENAFIL CITRATE 50 MG PO TABS: 50.0000 mg | ORAL_TABLET | Freq: Every day | ORAL | 1 refills | Status: DC | PRN

## 2023-06-08 MED ORDER — FAMOTIDINE 20 MG PO TABS
20.0000 mg | ORAL_TABLET | Freq: Once | ORAL | Status: AC
  Administered 2023-06-08: 20 mg via ORAL
  Filled 2023-06-08: qty 1

## 2023-06-08 MED ORDER — SODIUM CHLORIDE 0.9 % IV SOLN: Freq: Once | INTRAVENOUS | Status: AC

## 2023-06-08 MED ORDER — SODIUM CHLORIDE 0.9 % IV SOLN
1200.0000 mg | Freq: Once | INTRAVENOUS | Status: AC
  Administered 2023-06-08: 1200 mg via INTRAVENOUS
  Filled 2023-06-08: qty 20

## 2023-06-08 NOTE — Patient Instructions (Signed)
[facility] - A DEPT OF [facility]  Discharge Instructions: Thank you for choosing [facility] to provide your oncology and hematology care.   If you have a lab appointment with the Cancer Center, please go directly to the Cancer Center and check in at the registration area.   Wear comfortable clothing and clothing appropriate for easy access to any Portacath or PICC line.   We strive to give you quality time with your provider. You may need to reschedule your appointment if you arrive late (15 or more minutes).  Arriving late affects you and other patients whose appointments are after yours.  Also, if you miss three or more appointments without notifying the office, you may be dismissed from the clinic at the provider's discretion.      For prescription refill requests, have your pharmacy contact our office and allow 72 hours for refills to be completed.    Today you received the following chemotherapy and/or immunotherapy agents: Tecentriq      To help prevent nausea and vomiting after your treatment, we encourage you to take your nausea medication as directed.  BELOW ARE SYMPTOMS THAT SHOULD BE REPORTED IMMEDIATELY: *FEVER GREATER THAN 100.4 F (38 C) OR HIGHER *CHILLS OR SWEATING *NAUSEA AND VOMITING THAT IS NOT CONTROLLED WITH YOUR NAUSEA MEDICATION *UNUSUAL SHORTNESS OF BREATH *UNUSUAL BRUISING OR BLEEDING *URINARY PROBLEMS (pain or burning when urinating, or frequent urination) *BOWEL PROBLEMS (unusual diarrhea, constipation, pain near the anus) TENDERNESS IN MOUTH AND THROAT WITH OR WITHOUT PRESENCE OF ULCERS (sore throat, sores in mouth, or a toothache) UNUSUAL RASH, SWELLING OR PAIN  UNUSUAL VAGINAL DISCHARGE OR ITCHING   Items with * indicate a potential emergency and should be followed up as soon as possible or go to the Emergency Department if any problems should occur.  Please show the CHEMOTHERAPY ALERT CARD or IMMUNOTHERAPY  ALERT CARD at check-in to the Emergency Department and triage nurse.  Should you have questions after your visit or need to cancel or reschedule your appointment, please contact [facility] - A DEPT OF [facility]  Dept: [phone]  and follow the prompts.  Office hours are 8:00 a.m. to 4:30 p.m. Monday - Friday. Please note that voicemails left after 4:00 p.m. may not be returned until the following business day.  We are closed weekends and major holidays. You have access to a nurse at all times for urgent questions. Please call the main number to the clinic Dept: [phone] and follow the prompts.   For any non-urgent questions, you may also contact your provider using MyChart. We now offer e-Visits for anyone 62 and older to request care online for non-urgent symptoms. For details visit [email or web address].   Also download the MyChart app! Go to the app store, search "MyChart", open the app, select [place], and log in with your MyChart username and password.

## 2023-06-08 NOTE — Progress Notes (Signed)
Patient seen by Dr. Addison Naegeli are within treatment parameters.  Labs reviewed: and are within treatment parameters.  Per physician team, patient is ready for treatment and there are NO modifications to the treatment plan.

## 2023-06-08 NOTE — Progress Notes (Signed)
HEMATOLOGY/ONCOLOGY CLINIC NOTE  Date of Service: 06/08/23  Patient Care Team: Patient, No Pcp Per as PCP - General (General Practice)   CHIEF COMPLAINTS/PURPOSE OF CONSULTATION:  Follow-up for continued evaluation and management of metastatic lung adenocarcinoma  HISTORY OF PRESENTING ILLNESS:  Please see previous notes for details on initial presentation.  Current Treatment:  Atezolizumab maintenance   INTERVAL HISTORY:   David Berry. Is a 58 y.o. male here for continued evaluation and management of his metastatic lung adenocarcinoma. He is here for cycle 24 day 1 of his atezolizumab treatment.   Patient was last seen by me on 05/18/2023 and complained of intermittent arthritic pain/stiffness in the right hip and leg lower extremity, small nodule in his right neck, intermittent lump in his right axillary, mild lower back stiffness, and stable mild hand tingling. He reported less hemorrhoidal bleeding.   Today, he reports that his skin rash with hives recurred last night. His rash was generalized,  including in the back and upper and lower extremities and itchy. His skin rash was not present in the face and he did not have any facial swelling. Patient has not taken medicaiton to address this. His skin rash symptoms resolved this morning. He reports that he was taken off of Zyrtec and pepside one week ago. He did not have a rash while taking these medications. Patient denies any known triggers such as certain food intake, soaps, or fragrances. He notes that he was feeling anxious around the time that his hives presented. Patient notes that yesterday was a particularly busy day in which he drove for an extended period.   His hemorrhoids have been fairly stable and he continues to use Preparation H wipes.   He denies any new SOB, chest pain, new lumps/bumps, back pain, abdominal pain, or leg swelling. He reports numbness in his entire right lower extremity.   MEDICAL  HISTORY:  Past Medical History:  Diagnosis Date   Anxiety    Bipolar disorder (HCC)    Blood in stool    Cancer (HCC)    Chronic bronchitis with emphysema (HCC)    pt denies this   Eczema    GERD (gastroesophageal reflux disease)    Lung cancer (HCC) dx'd 01/2019   Peripheral vascular disease (HCC)    blood clot in neck Sept 12, 2020    SURGICAL HISTORY: Past Surgical History:  Procedure Laterality Date   APPENDECTOMY     teenager   INGUINAL HERNIA REPAIR Right 2016, 2017   [facility], 2018 [facility]-removed mesh   IR IMAGING GUIDED PORT INSERTION  04/26/2019   TONSILLECTOMY     TOOTH EXTRACTION N/A 03/14/2020   Procedure: DENTAL RESTORATION/EXTRACTIONS;  Surgeon: Ocie Doyne, DDS;  Location: [facility] OR;  Service: Oral Surgery;  Laterality: N/A;    SOCIAL HISTORY: Social History   Socioeconomic History   Marital status: Single    Spouse name: Not on file   Number of children: 1   Years of education: GED   Highest education level: Some college, no degree  Occupational History    Comment: fork lift driver  Tobacco Use   Smoking status: Former    Types: Cigars    Quit date: 01/20/2019    Years since quitting: 4.3    Passive exposure: Current   Smokeless tobacco: Never   Tobacco comments:    smokes 3 black and mild cigars per day  Vaping Use   Vaping status: Never Used  Substance  and Sexual Activity   Alcohol use: Not Currently   Drug use: No   Sexual activity: Yes  Other Topics Concern   Not on file  Social History Narrative   Lives alone   Caffeine- coffee, 20 oz daily, tea occas   Social Drivers of Health   Financial Resource Strain: Low Risk  (09/29/2020)   Overall Financial Resource Strain (CARDIA)    Difficulty of Paying Living Expenses: Not very hard  Food Insecurity: No Food Insecurity (09/29/2020)   Hunger Vital Sign    Worried About Running Out of Food in the Last Year: Never true    Ran Out of Food in the Last Year: Never true   Transportation Needs: No Transportation Needs (09/29/2020)   PRAPARE - Administrator, Civil Service (Medical): No    Lack of Transportation (Non-Medical): No  Physical Activity: Sufficiently Active (09/29/2020)   Exercise Vital Sign    Days of Exercise per Week: 3 days    Minutes of Exercise per Session: 150+ min  Stress: No Stress Concern Present (09/29/2020)   [employer] of Occupational Health - Occupational Stress Questionnaire    Feeling of Stress : Not at all  Social Connections: Unknown (03/01/2022)   Received from [facility], [facility]   Social Network    Social Network: Not on file  Intimate Partner Violence: Unknown (03/01/2022)   Received from [facility], [facility]   HITS    Physically Hurt: Not on file    Insult or Talk Down To: Not on file    Threaten Physical Harm: Not on file    Scream or Curse: Not on file    FAMILY HISTORY: Family History  Problem Relation Age of Onset   Prostate cancer Father    Colon cancer Neg Hx    Stomach cancer Neg Hx    Esophageal cancer Neg Hx     ALLERGIES:  is allergic to atorvastatin calcium.  MEDICATIONS:  Current Outpatient Medications  Medication Sig Dispense Refill   apixaban (ELIQUIS) 2.5 MG TABS tablet Take 1 tablet (2.5 mg total) by mouth 2 (two) times daily. 60 tablet 5   cetirizine (ZYRTEC ALLERGY) 10 MG tablet Take 1 tablet (10 mg total) by mouth 2 (two) times daily as needed (hives). 60 tablet 1   Cyanocobalamin (VITAMIN B 12 PO) Take 2,500 mcg by mouth daily.     esomeprazole (NEXIUM) 40 MG capsule TAKE 1 CAPSULE(40 MG) BY MOUTH DAILY 90 capsule 2   famotidine (PEPCID) 20 MG tablet Take 1 tablet (20 mg total) by mouth 2 (two) times daily. 60 tablet 1   lidocaine-prilocaine (EMLA) cream Apply 1 Application topically as needed. 30 g 0   ondansetron (ZOFRAN) 4 MG tablet Take 1 tablet (4 mg total) by mouth every 8 (eight) hours as needed for nausea or vomiting. 30 tablet 1   propranolol  (INDERAL) 20 MG tablet TAKE 1 TABLET(20 MG) BY MOUTH TWICE DAILY 60 tablet 5   sildenafil (VIAGRA) 50 MG tablet Take 1 tablet (50 mg total) by mouth daily as needed for erectile dysfunction. Take 30 mins to 4 hours prior to sexual activity 30 tablet 1   No current facility-administered medications for this visit.   Facility-Administered Medications Ordered in Other Visits  Medication Dose Route Frequency Provider Last Rate Last Admin   sodium chloride flush (NS) 0.9 % injection 10 mL  10 mL Intracatheter PRN [name], MD   10 mL at 05/18/23 1559  REVIEW OF SYSTEMS:    10 Point review of Systems was done is negative except as noted above.   PHYSICAL EXAMINATION: ECOG FS:1 - Symptomatic but completely ambulatory .There were no vitals taken for this visit.  Wt Readings from Last 3 Encounters:  05/25/23 130 lb 3.2 oz (59.1 kg)  05/18/23 132 lb 9.6 oz (60.1 kg)  04/27/23 131 lb (59.4 kg)   There is no height or weight on file to calculate BMI.      GENERAL:alert, in no acute distress and comfortable SKIN: no acute rashes, no significant lesions EYES: conjunctiva are pink and non-injected, sclera anicteric OROPHARYNX: MMM, no exudates, no oropharyngeal erythema or ulceration NECK: supple, no JVD LYMPH:  no palpable lymphadenopathy in the cervical, axillary or inguinal regions LUNGS: clear to auscultation b/l with normal respiratory effort HEART: regular rate & rhythm ABDOMEN:  normoactive bowel sounds , non tender, not distended. Extremity: no pedal edema PSYCH: alert & oriented x 3 with fluent speech NEURO: no focal motor/sensory deficits   LABORATORY DATA:  I have reviewed the data as listed    Latest Ref Rng & Units 05/18/2023    1:06 PM 04/27/2023    2:42 PM 04/05/2023   11:38 AM  CBC  WBC 4.0 - 10.5 K/uL 6.2  6.2  5.1   Hemoglobin 13.0 - 17.0 g/dL 29.5  62.1  30.8   Hematocrit 39.0 - 52.0 % 39.1  38.5  39.4   Platelets 150 - 400 K/uL 376  346  288        Latest Ref Rng & Units 05/18/2023    1:06 PM 04/27/2023    2:42 PM 04/05/2023   11:38 AM  CMP  Glucose 70 - 99 mg/dL 657  846  962   BUN 6 - 20 mg/dL 8  9  11    Creatinine 0.61 - 1.24 mg/dL 9.52  8.41  3.24   Sodium 135 - 145 mmol/L 138  138  138   Potassium 3.5 - 5.1 mmol/L 4.2  3.8  4.3   Chloride 98 - 111 mmol/L 104  105  104   CO2 22 - 32 mmol/L 29  28  30    Calcium 8.9 - 10.3 mg/dL 9.6  9.7  9.6   Total Protein 6.5 - 8.1 g/dL 7.2  7.1  6.9   Total Bilirubin 0.0 - 1.2 mg/dL 0.4  0.4  0.4   Alkaline Phos 38 - 126 U/L 133  130  106   AST 15 - 41 U/L 14  15  14    ALT 0 - 44 U/L 11  10  10     Lab Results  Component Value Date   IRON 16 (L) 05/05/2022   TIBC 448 05/05/2022   FERRITIN 52 10/20/2022    01/22/2019 [facility]: Tumor Mutational Burden     01/22/2019 PD-L1 Immunohistochemistry Analysis    01/22/2019 Soft Tissue Needle Core Biopsy Surgical Pathology     RADIOGRAPHIC STUDIES: I have personally reviewed the radiological images as listed and agreed with the findings in the report. No results found.    ASSESSMENT & PLAN:   This is a 58 year old male with   1. Metastatic Poorly differentiated lung adenocarcinoma Presented with Large neck mass, mediastinal mass, renal mass, and questionable bone lesions  no brain mets on MRI brain 01/22/2019 PD-L1 Immunohistochemistry Analysis which revealed "Tumor Proportion Score (TPS) 50%" 05/16/2019 PET/CT ([id number]) revealed "Radiation changes in the right hemithorax, as above. Improving mediastinal nodal metastases. Prior bulky right  cervical metastases have resolved. No evidence of metastatic disease in the abdomen/pelvis." 07/19/2019 Esophagus Scan ([id number]) revealed "Mild stricture at the C6-7 level likely related to prior esophageal surgery. Barium tablet was slow to pass through this area but did pass through after 1 minutes. Hiatal hernia with moderate gastroesophageal reflux. Moderate stricture above the  hiatal hernia likely due to reflux. Barium tablet did not pass this area. There are changes of esophagitis which are likely due to reflux and possibly radiation as well."  10/10/19 of  CT Chest W Contrast ([id number])- no evidence of lung cancer progression at this time. He did have a PET CT scan on 04/13/2021 which was reviewed on the previous visit by Karena Addison.  This showed some borderline FDG avid right paratracheal right hilar and azygoesophageal lymph nodes that were suspicious for possible nodal recurrence however the patient notes that he did have a bad upper respiratory tract infection around the time that he had his PET CT scan and therefore reactive adenopathy is a possibility as well.    2. 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 Symptomatic hemorrhoids-currently no significant bleeding but have caused some mild iron deficiency anemia.  5. Normocytic anemia -Likely due to underlying malignancy + Iron deficiency due to ongoing bleeding from hemorrhoids   6. S/pThrombocytosis -- likely due to paraneoplastic effect of tumor and reactive due to inflammation and tissue inflammation from RT.-- now resolved.   7. Nicotine dependence -has quit since cancer diagnosis  8. Iron deficiency - likely due to blood loss from hemorrhoids  9.  Erectile dysfunction-having partial benefits with using Viagra.  PLAN:   -Discussed lab results on 06/08/23 in detail with patient. CBC showed WBC of 5.0K, hemoglobin of 12.5, and platelets of 379K. -continue Zyrtec and Pepsid once a day to control skin rashes -if there is concern for skin rashes, he may take Zyrtec and Pepsid twice a day -discussed that there may be some inflammatory process from immunotherapy which is not a significant concern at this time. If his Antihistamines control his skin rashes, then there may  not be a role to stop other treatment.  -Patient has no lab or clinical evidence of lung cancer recurrence/progression at this time. -patient has no notable toxicities with immunotherapy at this time -continue Atezolizumab immunotherapy -continue Eliquis 2.5mg  BID today. -discussed that it is more likely that his right lower extremity numbness is from disc issues involving pinched nerves in the back -patient shall return to clinic in 6 weeks with labs -he shall follow up with Rheumatologist to ensure that his skin rash is not related to an autoimmune condition  FOLLOW UP: Plz schedule Atezolimzumab every 3 weeks with port flush and labs MD visit every 6 weeks with every other treatment  The total time spent in the appointment was *** minutes* .  All of the patient's questions were answered with apparent satisfaction. The patient knows to call the clinic with any problems, questions or concerns.   [name] MD MS AAHIVMS [facility] ([place] Hematology/Oncology Physician [facility]  .*Total Encounter Time as defined by the Centers for Medicare and Medicaid Services includes, in addition to the face-to-face time of a patient visit (documented in the note above) non-face-to-face time: obtaining and reviewing outside history, ordering and reviewing medications, tests or procedures, care coordination (communications with other health care  professionals or caregivers) and documentation in the medical record.    I,Mitra Faeizi,acting as a Neurosurgeon for [name], MD.,have documented all relevant documentation on the behalf of [name], MD,as directed by  [name], MD while in the presence of [name], MD.   ***

## 2023-06-14 ENCOUNTER — Encounter: Payer: Self-pay | Admitting: Hematology

## 2023-06-14 ENCOUNTER — Encounter: Payer: Self-pay | Admitting: Physician Assistant

## 2023-06-20 ENCOUNTER — Encounter: Payer: Self-pay | Admitting: Hematology

## 2023-06-20 ENCOUNTER — Encounter: Payer: Self-pay | Admitting: Physician Assistant

## 2023-06-21 ENCOUNTER — Ambulatory Visit (INDEPENDENT_AMBULATORY_CARE_PROVIDER_SITE_OTHER): Payer: Medicare HMO

## 2023-06-21 ENCOUNTER — Encounter: Payer: Self-pay | Admitting: Internal Medicine

## 2023-06-21 ENCOUNTER — Ambulatory Visit: Payer: Medicare HMO | Attending: Internal Medicine | Admitting: Internal Medicine

## 2023-06-21 VITALS — BP 96/67 | HR 77 | Resp 14 | Ht 70.25 in | Wt 132.0 lb

## 2023-06-21 DIAGNOSIS — G8929 Other chronic pain: Secondary | ICD-10-CM

## 2023-06-21 DIAGNOSIS — M25551 Pain in right hip: Secondary | ICD-10-CM | POA: Insufficient documentation

## 2023-06-21 DIAGNOSIS — L509 Urticaria, unspecified: Secondary | ICD-10-CM

## 2023-06-21 DIAGNOSIS — R768 Other specified abnormal immunological findings in serum: Secondary | ICD-10-CM | POA: Insufficient documentation

## 2023-06-21 DIAGNOSIS — Z2989 Encounter for other specified prophylactic measures: Secondary | ICD-10-CM | POA: Diagnosis not present

## 2023-06-21 DIAGNOSIS — M7989 Other specified soft tissue disorders: Secondary | ICD-10-CM

## 2023-06-21 DIAGNOSIS — M545 Low back pain, unspecified: Secondary | ICD-10-CM

## 2023-06-21 MED ORDER — FAMOTIDINE 20 MG PO TABS: 20.0000 mg | ORAL_TABLET | Freq: Two times a day (BID) | ORAL | 1 refills | Status: DC

## 2023-06-21 MED ORDER — CETIRIZINE HCL 10 MG PO TABS: 10.0000 mg | ORAL_TABLET | Freq: Two times a day (BID) | ORAL | 1 refills | Status: DC | PRN

## 2023-06-21 NOTE — Progress Notes (Signed)
Office Visit Note  Patient: David Berry.             Date of Birth: 01/25/1966           MRN: [id number]             PCP: [name], MD Referring: Ellamae Sia, DO Visit Date: 06/21/2023   Subjective:  New Patient (Initial Visit) (Patient states he has joint pain in his right fingers, right hip, and lower back. Patient states he gets a rash on his shoulders that will spread to the rest of his body. )   Discussed the use of AI scribe software for clinical note transcription with the patient, who gave verbal consent to proceed.  History of Present Illness   David Berry. is a 58 year old male here for evaluation of rashes, joint pain, and abnormal blood test results with positive dsDNA. He was referred by Dr. Murrell Converse who saw him on account of urticarial rashes, which did improve on subsequent treatment with high dose zyrtec and pepcid.  He has been on Tecentriq (PD-L1) for lung cancer since 2020, with no changes to this treatment in the last year.  Rashes have been present since January of last year, initially associated with starting a statin medication. The rashes appear as hives, affecting his sides, back, legs, and shoulders, and are described as itchy but not painful. They typically last overnight, leaving redness the next morning. The rashes subsided while on Zyrtec and Pepcid, but recurred after discontinuation of these medications.  Joint pain and swelling have been present since last year, coinciding with the onset of the rashes. He describes a tingling sensation in his fingers and swelling in his hands. No prior history of similar symptoms before last year. He has not been on any steroids recently, although he used them a few years ago for back problems.   He has experienced new onset headaches over the past few months, described as short and progressive.  He has a history of falling on his right side at the end of the year before last, which he associates  with current hip pain. He has not had any x-rays for this issue. He describes the pain as arthritic, particularly when moving or getting up, and notes it is worse since stopping Zyrtec and Pepcid.  Labs reviewed 04/2023 dsDNA 243 RNP, Sm, SSA, SSB neg ESR 43  Activities of Daily Living:  Patient reports morning stiffness for 20 minutes.   Patient Reports nocturnal pain.  Difficulty dressing/grooming: Denies Difficulty climbing stairs: Denies Difficulty getting out of chair: Reports Difficulty using hands for taps, buttons, cutlery, and/or writing: Denies  Review of Systems  Constitutional:  Negative for fatigue.  HENT:  Positive for mouth dryness. Negative for mouth sores.   Eyes:  Negative for dryness.  Respiratory:  Negative for shortness of breath.   Cardiovascular:  Negative for chest pain and palpitations.  Gastrointestinal:  Positive for blood in stool. Negative for constipation and diarrhea.  Endocrine: Positive for increased urination.  Genitourinary:  Positive for involuntary urination.  Musculoskeletal:  Positive for joint pain, gait problem, joint pain, myalgias, muscle weakness, morning stiffness, muscle tenderness and myalgias. Negative for joint swelling.  Skin:  Positive for rash and sensitivity to sunlight. Negative for color change and hair loss.  Allergic/Immunologic: Negative for susceptible to infections.  Neurological:  Positive for dizziness and headaches.  Hematological:  Negative for swollen glands.  Psychiatric/Behavioral:  Positive for depressed mood.  Negative for sleep disturbance. The patient is nervous/anxious.     PMFS History:  Patient Active Problem List   Diagnosis Date Noted   Positive ANA (antinuclear antibody) 06/21/2023   Pain in right hip 06/21/2023   Intention tremor 04/21/2021   Mononeuropathy 04/21/2021   Chronic anticoagulation 02/28/2021   Right shoulder pain 02/28/2021   Sternoclavicular joint pain, right 02/28/2021   Encounter  for immunotherapy 10/29/2020   Iron deficiency anemia 10/29/2020   Gastroesophageal reflux disease 10/02/2020   Hypotension due to hypovolemia 10/02/2020   Port-A-Cath in place 06/13/2019   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/26/2019   Former smoker 02/26/2019   Adenocarcinoma of right lung (HCC) 02/05/2019   Counseling regarding advance care planning and goals of care 02/05/2019   Acute deep vein thrombosis (DVT) of non-extremity vein    Adenocarcinoma of lung (HCC)    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   Internal jugular (IJ) vein thromboembolism, acute, right (HCC) 01/24/2019   Mass of right side of neck, s/p Radiation therapy 01/24/2019   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   Right renal mass 01/24/2019   Hip mass, right 01/24/2019   Thyromegaly 01/24/2019   Extravasation injury of IV catheter site with other complication (HCC)    BACK PAIN, LUMBAR 03/24/2010   LUMBAR RADICULOPATHY 03/24/2010    Past Medical History:  Diagnosis Date   Anxiety    Bipolar disorder (HCC)    Blood in stool    Cancer (HCC)    Chronic bronchitis with emphysema (HCC)    pt denies this   Eczema    GERD (gastroesophageal reflux disease)    Lung cancer (HCC) dx'd 01/2019   Peripheral vascular disease (HCC)    blood clot in neck Sept 12, 2020    Family History  Problem Relation Age of Onset   Prostate cancer Father    Colon cancer Neg Hx    Stomach cancer Neg Hx    Esophageal cancer Neg Hx    Past Surgical History:  Procedure Laterality Date   APPENDECTOMY     teenager   INGUINAL HERNIA REPAIR Right 2016, 2017   [facility], 2018 [facility]-removed mesh   IR IMAGING GUIDED PORT INSERTION  04/26/2019   TONSILLECTOMY     TOOTH EXTRACTION N/A 03/14/2020   Procedure: DENTAL RESTORATION/EXTRACTIONS;  Surgeon: Ocie Doyne, DDS;  Location: [facility] OR;  Service: Oral Surgery;  Laterality: N/A;   Social History   Social History Narrative   Lives alone   Caffeine- coffee, 20 oz daily, tea occas    There  is no immunization history on file for this patient.   Objective: Vital Signs: BP 96/67 (BP Location: Right Arm, Patient Position: Sitting, Cuff Size: Normal)   Pulse 77   Resp 14   Ht 5' 10.25" (1.784 m)   Wt 132 lb (59.9 kg)   BMI 18.81 kg/m    Physical Exam HENT:     Mouth/Throat:     Mouth: Mucous membranes are moist.     Pharynx: Oropharynx is clear.  Eyes:     Conjunctiva/sclera: Conjunctivae normal.  Cardiovascular:     Rate and Rhythm: Normal rate and regular rhythm.  Pulmonary:     Effort: Pulmonary effort is normal.     Breath sounds: Normal breath sounds.  Musculoskeletal:     Right lower leg: No edema.     Left lower leg: No edema.  Lymphadenopathy:     Cervical: No cervical adenopathy.  Skin:    General: Skin is  warm and dry.     Findings: No rash.  Neurological:     Mental Status: He is alert.  Psychiatric:        Mood and Affect: Mood normal.      Musculoskeletal Exam:  Shoulders full ROM no tenderness or swelling Wrists full ROM no tenderness or swelling Fingers full ROM no tenderness or swelling Low back midline and paraspinal muscle tenderness to pressure Right hip pain provoked with internal and external rotation and standing from seated position, no lateral tenderness to pressure, good ROM Knees full ROM no tenderness or swelling   Investigation: No additional findings.  Imaging: XR Lumbar Spine 2-3 Views Result Date: 06/21/2023 X-ray lumbar spine 2 views Rotational effect on AP view but possible lumbar scoliosis present.  May be mild degenerative disc changes contributing.  No significant appearing spondylolisthesis.  Vertebral bodies are well-preserved.  No significant appearing endplate osteophytes.  XR HIP UNILAT W OR W/O PELVIS 2-3 VIEWS RIGHT Result Date: 06/21/2023 X-ray right hip 2 views Right hip joint space appears preserved.  No significant marginal bone spurring or overhang.  No obvious sclerotic lesions.  SI joints appear patent  bilaterally with some degenerative appearing changes no increased sclerosis erosions or joint widening present. Impression Hip joint appears normal, mild appearing osteoarthritis of SI joints  XR Hand 2 View Right Result Date: 06/21/2023 X-ray right hand 2 views Radiocarpal and carpal joints appear normal.  There is a radiopaque object present on the ulnar side at the third metacarpal was consistent for a metal pellet.  MCP joint spaces are preserved few subchondral cyst changes.  PIP and DIP joints appear normal.  No erosions or abnormal calcifications seen.  Bone mineralization appears normal. Impression Very mild appearing degenerative changes   XR Hand 2 View Left Result Date: 06/21/2023 X-ray left hand 2 views Radiocarpal carpal joints appear normal.  MCP joints appear normal.  PIP and DIP joints appear normal.  No erosions or abnormal calcifications seen.  Bone mineralization appears normal. Impression Normal hand x-ray   Recent Labs: Lab Results  Component Value Date   WBC 5.0 06/08/2023   HGB 12.5 (L) 06/08/2023   PLT 379 06/08/2023   NA 138 06/08/2023   K 4.0 06/08/2023   CL 105 06/08/2023   CO2 28 06/08/2023   GLUCOSE 113 (H) 06/08/2023   BUN 10 06/08/2023   CREATININE 0.81 06/08/2023   BILITOT 0.4 06/08/2023   ALKPHOS 117 06/08/2023   AST 13 (L) 06/08/2023   ALT 8 06/08/2023   PROT 7.0 06/08/2023   ALBUMIN 4.0 06/08/2023   CALCIUM 9.3 06/08/2023   GFRAA >60 01/30/2020    Speciality Comments: No specialty comments available.  Procedures:  No procedures performed Allergies: Atorvastatin calcium   Assessment / Plan:     Visit Diagnoses: Positive ANA (antinuclear antibody) Urticaria - Plan: cetirizine (ZYRTEC ALLERGY) 10 MG tablet, famotidine (PEPCID) 20 MG tablet, C3 and C4, Anti-DNA antibody, double-stranded, Protein / creatinine ratio, urine, Rheumatoid factor, Sedimentation rate, C-reactive protein Recurrent hives and joint swelling since last year, possibly  related to immune checkpoint inhibitor (Tecentriq) for cancer treatment. Symptoms improved with Zyrtec and Pepcid, but recurred after discontinuation. -Checking sed rate, complement, urine P/C ratio for diease activity assessment -Rechecking dsDNA, also check RF given questionable IRAE -Resume Zyrtec and Pepcid.  Chronic bilateral low back pain, unspecified whether sciatica present Pain in right hip - Plan: XR HIP UNILAT W OR W/O PELVIS 2-3 VIEWS RIGHT, XR Lumbar Spine 2-3 Views Hip  pain  Complaints of joint pain, particularly in the lower back and right hip. Symptoms currently more suggestive for OA related pain with no peripheral joint synovitis. But he does describe discrete inflammation of MCPs coming and going before. -Order X-rays of hands, hips, and lower back  Bilateral hand swelling - Plan: XR Hand 2 View Right, XR Hand 2 View Left Reported hand pain and swelling exam is normal today with no synovitis.  X-ray of bilateral hands without significant arthritis.  May be consistent for inflammatory arthritis other possibilities include swelling related to histamine mediated process or peripheral edema.  Cancer Treatment Currently on Tecentriq (PDL1 immune checkpoint inhibitor) for cancer treatment. Possible immune-related side effects including hives and joint pain although timing is nonspecific.  Follow-up Pending lab and imaging results. If no significant findings, continue antihistamines and monitor symptoms. If red flags are identified, consider stronger immunosuppressive medications, if so would check with oncology team.    Orders: Orders Placed This Encounter  Procedures   XR HIP UNILAT W OR W/O PELVIS 2-3 VIEWS RIGHT   XR Lumbar Spine 2-3 Views   XR Hand 2 View Right   XR Hand 2 View Left   C3 and C4   Anti-DNA antibody, double-stranded   Protein / creatinine ratio, urine   Rheumatoid factor   Sedimentation rate   C-reactive protein   Meds ordered this encounter   Medications   cetirizine (ZYRTEC ALLERGY) 10 MG tablet    Sig: Take 1 tablet (10 mg total) by mouth 2 (two) times daily as needed (hives).    Dispense:  60 tablet    Refill:  1   famotidine (PEPCID) 20 MG tablet    Sig: Take 1 tablet (20 mg total) by mouth 2 (two) times daily.    Dispense:  60 tablet    Refill:  1     Follow-Up Instructions: Return in about 4 weeks (around 07/19/2023) for New pt ANA/?IRAE f/u 23mo.   Fuller Plan, MD  Note - This record has been created using [employer].  Chart creation errors have been sought, but may not always  have been located. Such creation errors do not reflect on  the standard of medical care.

## 2023-06-21 NOTE — Patient Instructions (Addendum)
I recommend resuming the oral antihistamines zyrtec and pepcid. You can try resuming these at once daily but can go back up to 2 if symptoms persist.  I am checking several tests to look for evidence of inflammation throughout the body. If these are concerning we may need to start stronger medicines going forwards.

## 2023-06-22 LAB — RHEUMATOID FACTOR: Rheumatoid fact SerPl-aCnc: <10 [IU]/mL (ref <14)

## 2023-06-22 LAB — C3 AND C4
C3 Complement: 157 mg/dL (ref 82–185)
C4 Complement: 50 mg/dL (ref 15–53)

## 2023-06-22 LAB — PROTEIN / CREATININE RATIO, URINE
Creatinine, Urine: 211 mg/dL (ref 20–320)
Protein/Creat Ratio: 62 mg/g{creat} (ref 25–148)
Protein/Creatinine Ratio: 0.062 mg/mg{creat} (ref 0.025–0.148)
Total Protein, Urine: 13 mg/dL (ref 5–25)

## 2023-06-22 LAB — SEDIMENTATION RATE: Sed Rate: 34 mm/h — ABNORMAL HIGH (ref 0–20)

## 2023-06-22 LAB — ANTI-DNA ANTIBODY, DOUBLE-STRANDED: ds DNA Ab: 264 [IU]/mL — ABNORMAL HIGH

## 2023-06-22 LAB — C-REACTIVE PROTEIN: CRP: 11.2 mg/L — ABNORMAL HIGH (ref <8.0)

## 2023-06-27 ENCOUNTER — Encounter: Payer: Self-pay | Admitting: Hematology

## 2023-06-27 ENCOUNTER — Telehealth: Payer: Self-pay | Admitting: Hematology

## 2023-06-27 ENCOUNTER — Encounter: Payer: Self-pay | Admitting: Physician Assistant

## 2023-06-27 NOTE — Telephone Encounter (Signed)
 Patient is aware of changed appointment times/dates

## 2023-06-28 ENCOUNTER — Inpatient Hospital Stay: Payer: Medicare HMO

## 2023-06-28 ENCOUNTER — Inpatient Hospital Stay: Payer: Medicare HMO | Attending: Physician Assistant

## 2023-06-28 VITALS — BP 107/69 | HR 66 | Temp 97.8°F | Resp 16 | Wt 134.4 lb

## 2023-06-28 DIAGNOSIS — Z87891 Personal history of nicotine dependence: Secondary | ICD-10-CM | POA: Insufficient documentation

## 2023-06-28 DIAGNOSIS — Z79899 Other long term (current) drug therapy: Secondary | ICD-10-CM | POA: Diagnosis not present

## 2023-06-28 DIAGNOSIS — Z7189 Other specified counseling: Secondary | ICD-10-CM

## 2023-06-28 DIAGNOSIS — C3491 Malignant neoplasm of unspecified part of right bronchus or lung: Secondary | ICD-10-CM | POA: Insufficient documentation

## 2023-06-28 DIAGNOSIS — Z7901 Long term (current) use of anticoagulants: Secondary | ICD-10-CM | POA: Diagnosis not present

## 2023-06-28 DIAGNOSIS — Z5112 Encounter for antineoplastic immunotherapy: Secondary | ICD-10-CM | POA: Insufficient documentation

## 2023-06-28 DIAGNOSIS — C771 Secondary and unspecified malignant neoplasm of intrathoracic lymph nodes: Secondary | ICD-10-CM | POA: Insufficient documentation

## 2023-06-28 DIAGNOSIS — Z95828 Presence of other vascular implants and grafts: Secondary | ICD-10-CM

## 2023-06-28 DIAGNOSIS — Z86718 Personal history of other venous thrombosis and embolism: Secondary | ICD-10-CM | POA: Diagnosis not present

## 2023-06-28 DIAGNOSIS — D649 Anemia, unspecified: Secondary | ICD-10-CM | POA: Insufficient documentation

## 2023-06-28 LAB — CBC WITH DIFFERENTIAL (CANCER CENTER ONLY)
Abs Immature Granulocytes: 0.01 10*3/uL (ref 0.00–0.07)
Basophils Absolute: 0.1 10*3/uL (ref 0.0–0.1)
Basophils Relative: 1 %
Eosinophils Absolute: 0.1 10*3/uL (ref 0.0–0.5)
Eosinophils Relative: 1 %
HCT: 37.4 % — ABNORMAL LOW (ref 39.0–52.0)
Hemoglobin: 12.1 g/dL — ABNORMAL LOW (ref 13.0–17.0)
Immature Granulocytes: 0 %
Lymphocytes Relative: 18 %
Lymphs Abs: 1.1 10*3/uL (ref 0.7–4.0)
MCH: 27.8 pg (ref 26.0–34.0)
MCHC: 32.4 g/dL (ref 30.0–36.0)
MCV: 85.8 fL (ref 80.0–100.0)
Monocytes Absolute: 1.1 10*3/uL — ABNORMAL HIGH (ref 0.1–1.0)
Monocytes Relative: 17 %
Neutro Abs: 3.9 10*3/uL (ref 1.7–7.7)
Neutrophils Relative %: 63 %
Platelet Count: 317 10*3/uL (ref 150–400)
RBC: 4.36 MIL/uL (ref 4.22–5.81)
RDW: 13.1 % (ref 11.5–15.5)
WBC Count: 6.2 10*3/uL (ref 4.0–10.5)
nRBC: 0 % (ref 0.0–0.2)

## 2023-06-28 LAB — CMP (CANCER CENTER ONLY)
ALT: 11 U/L (ref 0–44)
AST: 15 U/L (ref 15–41)
Albumin: 4 g/dL (ref 3.5–5.0)
Alkaline Phosphatase: 128 U/L — ABNORMAL HIGH (ref 38–126)
Anion gap: 5 (ref 5–15)
BUN: 10 mg/dL (ref 6–20)
CO2: 28 mmol/L (ref 22–32)
Calcium: 9.3 mg/dL (ref 8.9–10.3)
Chloride: 104 mmol/L (ref 98–111)
Creatinine: 0.88 mg/dL (ref 0.61–1.24)
GFR, Estimated: >60 mL/min (ref >60)
Glucose, Bld: 117 mg/dL — ABNORMAL HIGH (ref 70–99)
Potassium: 4.1 mmol/L (ref 3.5–5.1)
Sodium: 137 mmol/L (ref 135–145)
Total Bilirubin: 0.4 mg/dL (ref 0.0–1.2)
Total Protein: 6.8 g/dL (ref 6.5–8.1)

## 2023-06-28 LAB — TSH: TSH: 1.793 u[IU]/mL (ref 0.350–4.500)

## 2023-06-28 MED ORDER — SODIUM CHLORIDE 0.9 % IV SOLN
1200.0000 mg | Freq: Once | INTRAVENOUS | Status: AC
  Administered 2023-06-28: 1200 mg via INTRAVENOUS
  Filled 2023-06-28: qty 20

## 2023-06-28 MED ORDER — ACETAMINOPHEN 325 MG PO TABS
650.0000 mg | ORAL_TABLET | Freq: Once | ORAL | Status: AC
  Administered 2023-06-28: 650 mg via ORAL
  Filled 2023-06-28: qty 2

## 2023-06-28 MED ORDER — SODIUM CHLORIDE 0.9 % IV SOLN: Freq: Once | INTRAVENOUS | Status: AC

## 2023-06-28 MED ORDER — HEPARIN SOD (PORK) LOCK FLUSH 100 UNIT/ML IV SOLN
500.0000 [IU] | Freq: Once | INTRAVENOUS | Status: AC | PRN
  Administered 2023-06-28: 500 [IU]

## 2023-06-28 MED ORDER — SODIUM CHLORIDE 0.9% FLUSH
10.0000 mL | INTRAVENOUS | Status: DC | PRN
Start: 2023-06-28 — End: 2023-06-28
  Administered 2023-06-28: 10 mL

## 2023-06-28 MED ORDER — DIPHENHYDRAMINE HCL 25 MG PO CAPS
25.0000 mg | ORAL_CAPSULE | Freq: Once | ORAL | Status: AC
  Administered 2023-06-28: 25 mg via ORAL
  Filled 2023-06-28: qty 1

## 2023-06-28 MED ORDER — FAMOTIDINE 20 MG PO TABS
20.0000 mg | ORAL_TABLET | Freq: Once | ORAL | Status: AC
  Administered 2023-06-28: 20 mg via ORAL
  Filled 2023-06-28: qty 1

## 2023-06-28 MED ORDER — SODIUM CHLORIDE 0.9% FLUSH
10.0000 mL | INTRAVENOUS | Status: DC | PRN
  Administered 2023-06-28: 10 mL

## 2023-06-28 NOTE — Patient Instructions (Signed)
 [facility] - A DEPT OF [facility]  Discharge Instructions: Thank you for choosing [facility] to provide your oncology and hematology care.   If you have a lab appointment with the Cancer Center, please go directly to the Cancer Center and check in at the registration area.   Wear comfortable clothing and clothing appropriate for easy access to any Portacath or PICC line.   We strive to give you quality time with your provider. You may need to reschedule your appointment if you arrive late (15 or more minutes).  Arriving late affects you and other patients whose appointments are after yours.  Also, if you miss three or more appointments without notifying the office, you may be dismissed from the clinic at the provider's discretion.      For prescription refill requests, have your pharmacy contact our office and allow 72 hours for refills to be completed.    Today you received the following chemotherapy and/or immunotherapy agents: Tecentriq      To help prevent nausea and vomiting after your treatment, we encourage you to take your nausea medication as directed.  BELOW ARE SYMPTOMS THAT SHOULD BE REPORTED IMMEDIATELY: *FEVER GREATER THAN 100.4 F (38 C) OR HIGHER *CHILLS OR SWEATING *NAUSEA AND VOMITING THAT IS NOT CONTROLLED WITH YOUR NAUSEA MEDICATION *UNUSUAL SHORTNESS OF BREATH *UNUSUAL BRUISING OR BLEEDING *URINARY PROBLEMS (pain or burning when urinating, or frequent urination) *BOWEL PROBLEMS (unusual diarrhea, constipation, pain near the anus) TENDERNESS IN MOUTH AND THROAT WITH OR WITHOUT PRESENCE OF ULCERS (sore throat, sores in mouth, or a toothache) UNUSUAL RASH, SWELLING OR PAIN  UNUSUAL VAGINAL DISCHARGE OR ITCHING   Items with * indicate a potential emergency and should be followed up as soon as possible or go to the Emergency Department if any problems should occur.  Please show the CHEMOTHERAPY ALERT CARD or IMMUNOTHERAPY  ALERT CARD at check-in to the Emergency Department and triage nurse.  Should you have questions after your visit or need to cancel or reschedule your appointment, please contact [facility]  Dept: [phone]  and follow the prompts.  Office hours are 8:00 a.m. to 4:30 p.m. Monday - Friday. Please note that voicemails left after 4:00 p.m. may not be returned until the following business day.  We are closed weekends and major holidays. You have access to a nurse at all times for urgent questions. Please call the main number to the clinic Dept: [phone] and follow the prompts.   For any non-urgent questions, you may also contact your provider using MyChart. We now offer e-Visits for anyone 62 and older to request care online for non-urgent symptoms. For details visit [email or web address].   Also download the MyChart app! Go to the app store, search "MyChart", open the app, select [place], and log in with your MyChart username and password.

## 2023-06-29 ENCOUNTER — Other Ambulatory Visit: Payer: Medicare HMO

## 2023-06-29 ENCOUNTER — Ambulatory Visit: Payer: Medicare HMO

## 2023-07-07 ENCOUNTER — Other Ambulatory Visit: Payer: Self-pay | Admitting: Hematology

## 2023-07-13 ENCOUNTER — Encounter: Payer: Self-pay | Admitting: Hematology

## 2023-07-13 ENCOUNTER — Encounter: Payer: Self-pay | Admitting: Physician Assistant

## 2023-07-20 ENCOUNTER — Inpatient Hospital Stay: Payer: Medicare HMO | Attending: Physician Assistant

## 2023-07-20 ENCOUNTER — Inpatient Hospital Stay: Payer: Medicare HMO

## 2023-07-20 ENCOUNTER — Inpatient Hospital Stay (HOSPITAL_BASED_OUTPATIENT_CLINIC_OR_DEPARTMENT_OTHER): Payer: Medicare HMO | Admitting: Hematology

## 2023-07-20 VITALS — BP 96/66 | HR 66 | Temp 98.1°F | Resp 16 | Wt 132.5 lb

## 2023-07-20 DIAGNOSIS — Z5112 Encounter for antineoplastic immunotherapy: Secondary | ICD-10-CM | POA: Diagnosis present

## 2023-07-20 DIAGNOSIS — F1290 Cannabis use, unspecified, uncomplicated: Secondary | ICD-10-CM | POA: Diagnosis not present

## 2023-07-20 DIAGNOSIS — C3491 Malignant neoplasm of unspecified part of right bronchus or lung: Secondary | ICD-10-CM | POA: Insufficient documentation

## 2023-07-20 DIAGNOSIS — Z86718 Personal history of other venous thrombosis and embolism: Secondary | ICD-10-CM | POA: Diagnosis not present

## 2023-07-20 DIAGNOSIS — C771 Secondary and unspecified malignant neoplasm of intrathoracic lymph nodes: Secondary | ICD-10-CM | POA: Diagnosis present

## 2023-07-20 DIAGNOSIS — Z7189 Other specified counseling: Secondary | ICD-10-CM

## 2023-07-20 DIAGNOSIS — Z79899 Other long term (current) drug therapy: Secondary | ICD-10-CM | POA: Diagnosis not present

## 2023-07-20 DIAGNOSIS — Z95828 Presence of other vascular implants and grafts: Secondary | ICD-10-CM

## 2023-07-20 DIAGNOSIS — Z7901 Long term (current) use of anticoagulants: Secondary | ICD-10-CM | POA: Insufficient documentation

## 2023-07-20 DIAGNOSIS — D509 Iron deficiency anemia, unspecified: Secondary | ICD-10-CM | POA: Insufficient documentation

## 2023-07-20 LAB — CMP (CANCER CENTER ONLY)
ALT: 10 U/L (ref 0–44)
AST: 15 U/L (ref 15–41)
Albumin: 4 g/dL (ref 3.5–5.0)
Alkaline Phosphatase: 111 U/L (ref 38–126)
Anion gap: 4 — ABNORMAL LOW (ref 5–15)
BUN: 9 mg/dL (ref 6–20)
CO2: 28 mmol/L (ref 22–32)
Calcium: 9 mg/dL (ref 8.9–10.3)
Chloride: 104 mmol/L (ref 98–111)
Creatinine: 0.78 mg/dL (ref 0.61–1.24)
GFR, Estimated: >60 mL/min (ref >60)
Glucose, Bld: 104 mg/dL — ABNORMAL HIGH (ref 70–99)
Potassium: 4.3 mmol/L (ref 3.5–5.1)
Sodium: 136 mmol/L (ref 135–145)
Total Bilirubin: 0.4 mg/dL (ref 0.0–1.2)
Total Protein: 6.9 g/dL (ref 6.5–8.1)

## 2023-07-20 LAB — CBC WITH DIFFERENTIAL (CANCER CENTER ONLY)
Abs Immature Granulocytes: 0.01 10*3/uL (ref 0.00–0.07)
Basophils Absolute: 0 10*3/uL (ref 0.0–0.1)
Basophils Relative: 1 %
Eosinophils Absolute: 0 10*3/uL (ref 0.0–0.5)
Eosinophils Relative: 1 %
HCT: 38 % — ABNORMAL LOW (ref 39.0–52.0)
Hemoglobin: 12.1 g/dL — ABNORMAL LOW (ref 13.0–17.0)
Immature Granulocytes: 0 %
Lymphocytes Relative: 20 %
Lymphs Abs: 1 10*3/uL (ref 0.7–4.0)
MCH: 27.1 pg (ref 26.0–34.0)
MCHC: 31.8 g/dL (ref 30.0–36.0)
MCV: 85.2 fL (ref 80.0–100.0)
Monocytes Absolute: 0.8 10*3/uL (ref 0.1–1.0)
Monocytes Relative: 15 %
Neutro Abs: 3.3 10*3/uL (ref 1.7–7.7)
Neutrophils Relative %: 63 %
Platelet Count: 324 10*3/uL (ref 150–400)
RBC: 4.46 MIL/uL (ref 4.22–5.81)
RDW: 13.5 % (ref 11.5–15.5)
WBC Count: 5.1 10*3/uL (ref 4.0–10.5)
nRBC: 0 % (ref 0.0–0.2)

## 2023-07-20 MED ORDER — SODIUM CHLORIDE 0.9% FLUSH
10.0000 mL | INTRAVENOUS | Status: DC | PRN
Start: 2023-07-20 — End: 2023-07-20
  Administered 2023-07-20 (×2): 10 mL

## 2023-07-20 MED ORDER — FAMOTIDINE 20 MG PO TABS
20.0000 mg | ORAL_TABLET | Freq: Once | ORAL | Status: AC
  Administered 2023-07-20: 20 mg via ORAL
  Filled 2023-07-20: qty 1

## 2023-07-20 MED ORDER — SILDENAFIL CITRATE 50 MG PO TABS: 50.0000 mg | ORAL_TABLET | Freq: Every day | ORAL | 1 refills | Status: DC | PRN

## 2023-07-20 MED ORDER — DIPHENHYDRAMINE HCL 25 MG PO CAPS
25.0000 mg | ORAL_CAPSULE | Freq: Once | ORAL | Status: AC
  Administered 2023-07-20: 25 mg via ORAL
  Filled 2023-07-20: qty 1

## 2023-07-20 MED ORDER — ACETAMINOPHEN 325 MG PO TABS
650.0000 mg | ORAL_TABLET | Freq: Once | ORAL | Status: AC
  Administered 2023-07-20: 650 mg via ORAL
  Filled 2023-07-20: qty 2

## 2023-07-20 MED ORDER — SODIUM CHLORIDE 0.9 % IV SOLN: Freq: Once | INTRAVENOUS | Status: AC

## 2023-07-20 MED ORDER — SODIUM CHLORIDE 0.9 % IV SOLN
1200.0000 mg | Freq: Once | INTRAVENOUS | Status: AC
  Administered 2023-07-20: 1200 mg via INTRAVENOUS
  Filled 2023-07-20: qty 20

## 2023-07-20 MED ORDER — SODIUM CHLORIDE 0.9% FLUSH
10.0000 mL | INTRAVENOUS | Status: DC | PRN
  Administered 2023-07-20: 10 mL

## 2023-07-20 MED ORDER — HEPARIN SOD (PORK) LOCK FLUSH 100 UNIT/ML IV SOLN
500.0000 [IU] | Freq: Once | INTRAVENOUS | Status: AC | PRN
  Administered 2023-07-20: 500 [IU]

## 2023-07-20 NOTE — Patient Instructions (Signed)
 [facility]  Discharge Instructions: Thank you for choosing [facility] to provide your oncology and hematology care.   If you have a lab appointment with the Cancer Center, please go directly to the Cancer Center and check in at the registration area.   Wear comfortable clothing and clothing appropriate for easy access to any Portacath or PICC line.   We strive to give you quality time with your provider. You may need to reschedule your appointment if you arrive late (15 or more minutes).  Arriving late affects you and other patients whose appointments are after yours.  Also, if you miss three or more appointments without notifying the office, you may be dismissed from the clinic at the provider's discretion.      For prescription refill requests, have your pharmacy contact our office and allow 72 hours for refills to be completed.    Today you received the following chemotherapy and/or immunotherapy agents: atezolizumab      To help prevent nausea and vomiting after your treatment, we encourage you to take your nausea medication as directed.  BELOW ARE SYMPTOMS THAT SHOULD BE REPORTED IMMEDIATELY: *FEVER GREATER THAN 100.4 F (38 C) OR HIGHER *CHILLS OR SWEATING *NAUSEA AND VOMITING THAT IS NOT CONTROLLED WITH YOUR NAUSEA MEDICATION *UNUSUAL SHORTNESS OF BREATH *UNUSUAL BRUISING OR BLEEDING *URINARY PROBLEMS (pain or burning when urinating, or frequent urination) *BOWEL PROBLEMS (unusual diarrhea, constipation, pain near the anus) TENDERNESS IN MOUTH AND THROAT WITH OR WITHOUT PRESENCE OF ULCERS (sore throat, sores in mouth, or a toothache) UNUSUAL RASH, SWELLING OR PAIN  UNUSUAL VAGINAL DISCHARGE OR ITCHING   Items with * indicate a potential emergency and should be followed up as soon as possible or go to the Emergency Department if any problems should occur.  Please show the CHEMOTHERAPY ALERT CARD or  IMMUNOTHERAPY ALERT CARD at check-in to the Emergency Department and triage nurse.  Should you have questions after your visit or need to cancel or reschedule your appointment, please contact [facility]  Dept: [phone]  and follow the prompts.  Office hours are 8:00 a.m. to 4:30 p.m. Monday - Friday. Please note that voicemails left after 4:00 p.m. may not be returned until the following business day.  We are closed weekends and major holidays. You have access to a nurse at all times for urgent questions. Please call the main number to the clinic Dept: [phone] and follow the prompts.   For any non-urgent questions, you may also contact your provider using MyChart. We now offer e-Visits for anyone 44 and older to request care online for non-urgent symptoms. For details visit [email or web address].   Also download the MyChart app! Go to the app store, search "MyChart", open the app, select [place], and log in with your MyChart username and password.

## 2023-07-20 NOTE — Progress Notes (Signed)
 Patient seen by Dr. Addison Naegeli are not all within treatment parameters. Dr [name] aware BP  96/66  Labs reviewed: and are within treatment parameters.  Per physician team, patient is ready for treatment and there are NO modifications to the treatment plan.

## 2023-07-20 NOTE — Progress Notes (Signed)
 HEMATOLOGY/ONCOLOGY CLINIC NOTE  Date of Service: 07/20/23  Patient Care Team: [name], MD as PCP - General (Family Medicine)   CHIEF COMPLAINTS/PURPOSE OF CONSULTATION:  Follow-up for continued evaluation and management of metastatic lung adenocarcinoma  HISTORY OF PRESENTING ILLNESS:  Please see previous notes for details on initial presentation.  Current Treatment:  Atezolizumab maintenance   INTERVAL HISTORY:   David Berry. Is a 58 y.o. male here for continued evaluation and management of his metastatic lung adenocarcinoma. He is here for cycle 24 day 1 of his atezolizumab treatment.   Patient was last seen by me on 06/08/2023 and he complained of skin rashes in the back and upper/lower extremities, and right lower extremity numbness.   Patient notes he has been doing well overall since our last visit. He has been to his Rheumatologist regarding the skin rashes. He was given Zyrtec, which has resolved his skin rashes. He notes he currently takes one Zyrtec and Pepcid daily.   He denies any new infection issues, fever, chills, night sweats, unexpected weight loss, back pain, chest pain, abdominal pain, or leg swelling.   Patient does complain of abdominal pain at the site of hernia surgery. He also complains of left lower back pain that radiates to his left leg and occasional bilateral hand swelling.   Patient has bene tolerating his treatment well without any new or severe toxicities.   MEDICAL HISTORY:  Past Medical History:  Diagnosis Date   Anxiety    Bipolar disorder (HCC)    Blood in stool    Cancer (HCC)    Chronic bronchitis with emphysema (HCC)    pt denies this   Eczema    GERD (gastroesophageal reflux disease)    Lung cancer (HCC) dx'd 01/2019   Peripheral vascular disease (HCC)    blood clot in neck Sept 12, 2020    SURGICAL HISTORY: Past Surgical History:  Procedure Laterality Date   APPENDECTOMY     teenager   INGUINAL  HERNIA REPAIR Right 2016, 2017   [facility], [facility]-removed mesh   IR IMAGING GUIDED PORT INSERTION  04/26/2019   TONSILLECTOMY     TOOTH EXTRACTION N/A 03/14/2020   Procedure: DENTAL RESTORATION/EXTRACTIONS;  Surgeon: Ocie Doyne, DDS;  Location: [facility] OR;  Service: Oral Surgery;  Laterality: N/A;    SOCIAL HISTORY: Social History   Socioeconomic History   Marital status: Single    Spouse name: Not on file   Number of children: 1   Years of education: GED   Highest education level: Some college, no degree  Occupational History    Comment: fork lift driver  Tobacco Use   Smoking status: Former    Types: Cigars    Quit date: 01/20/2019    Years since quitting: 4.4    Passive exposure: Current   Smokeless tobacco: Never   Tobacco comments:    smokes 3 black and mild cigars per day  Vaping Use   Vaping status: Never Used  Substance and Sexual Activity   Alcohol use: Not Currently   Drug use: Yes    Types: Marijuana   Sexual activity: Yes  Other Topics Concern   Not on file  Social History Narrative   Lives alone   Caffeine- coffee, 20 oz daily, tea occas   Social Drivers of Health   Financial Resource Strain: Low Risk  (09/29/2020)   Overall Financial Resource Strain (CARDIA)    Difficulty of Paying Living Expenses: Not  very hard  Food Insecurity: No Food Insecurity (09/29/2020)   Hunger Vital Sign    Worried About Running Out of Food in the Last Year: Never true    Ran Out of Food in the Last Year: Never true  Transportation Needs: No Transportation Needs (09/29/2020)   PRAPARE - Administrator, Civil Service (Medical): No    Lack of Transportation (Non-Medical): No  Physical Activity: Sufficiently Active (09/29/2020)   Exercise Vital Sign    Days of Exercise per Week: 3 days    Minutes of Exercise per Session: 150+ min  Stress: No Stress Concern Present (09/29/2020)   [employer] of Occupational Health - Occupational Stress  Questionnaire    Feeling of Stress : Not at all  Social Connections: Unknown (03/01/2022)   Received from [facility], [facility]   Social Network    Social Network: Not on file  Intimate Partner Violence: Unknown (03/01/2022)   Received from [facility], [facility]   HITS    Physically Hurt: Not on file    Insult or Talk Down To: Not on file    Threaten Physical Harm: Not on file    Scream or Curse: Not on file    FAMILY HISTORY: Family History  Problem Relation Age of Onset   Prostate cancer Father    Colon cancer Neg Hx    Stomach cancer Neg Hx    Esophageal cancer Neg Hx     ALLERGIES:  is allergic to atorvastatin calcium.  MEDICATIONS:  Current Outpatient Medications  Medication Sig Dispense Refill   cetirizine (ZYRTEC ALLERGY) 10 MG tablet Take 1 tablet (10 mg total) by mouth 2 (two) times daily as needed (hives). 60 tablet 1   Cyanocobalamin (VITAMIN B 12 PO) Take 2,500 mcg by mouth daily.     ELIQUIS 2.5 MG TABS tablet TAKE 1 TABLET(2.5 MG) BY MOUTH TWICE DAILY 60 tablet 5   esomeprazole (NEXIUM) 40 MG capsule TAKE 1 CAPSULE(40 MG) BY MOUTH DAILY 90 capsule 2   famotidine (PEPCID) 20 MG tablet Take 1 tablet (20 mg total) by mouth 2 (two) times daily. 60 tablet 1   lidocaine-prilocaine (EMLA) cream Apply 1 Application topically as needed. 30 g 0   ondansetron (ZOFRAN) 4 MG tablet Take 1 tablet (4 mg total) by mouth every 8 (eight) hours as needed for nausea or vomiting. 30 tablet 1   propranolol (INDERAL) 20 MG tablet TAKE 1 TABLET(20 MG) BY MOUTH TWICE DAILY 60 tablet 5   sildenafil (VIAGRA) 50 MG tablet Take 1 tablet (50 mg total) by mouth daily as needed for erectile dysfunction. Take 30 mins to 4 hours prior to sexual activity 30 tablet 1   No current facility-administered medications for this visit.   Facility-Administered Medications Ordered in Other Visits  Medication Dose Route Frequency Provider Last Rate Last Admin   sodium chloride flush (NS) 0.9  % injection 10 mL  10 mL Intracatheter PRN [name], MD   10 mL at 05/18/23 1559   REVIEW OF SYSTEMS:    10 Point review of Systems was done is negative except as noted above.   PHYSICAL EXAMINATION: ECOG FS:1 - Symptomatic but completely ambulatory .BP 96/66 (BP Location: Left Arm, Patient Position: Sitting) Comment: RN notified  Pulse 66   Temp 98.1 F (36.7 C) (Temporal)   Resp 16   Wt 132 lb 8 oz (60.1 kg)   SpO2 99%   BMI 18.88 kg/m   Wt Readings from Last  3 Encounters:  07/20/23 132 lb 8 oz (60.1 kg)  06/28/23 134 lb 6.4 oz (61 kg)  06/21/23 132 lb (59.9 kg)   Body mass index is 18.88 kg/m.   GENERAL:alert, in no acute distress and comfortable SKIN: no acute rashes, no significant lesions EYES: conjunctiva are pink and non-injected, sclera anicteric OROPHARYNX: MMM, no exudates, no oropharyngeal erythema or ulceration NECK: supple, no JVD LYMPH:  no palpable lymphadenopathy in the cervical, axillary or inguinal regions LUNGS: clear to auscultation b/l with normal respiratory effort HEART: regular rate & rhythm ABDOMEN:  normoactive bowel sounds , non tender, not distended. Extremity: no pedal edema PSYCH: alert & oriented x 3 with fluent speech NEURO: no focal motor/sensory deficits   LABORATORY DATA:  I have reviewed the data as listed    Latest Ref Rng & Units 07/20/2023   12:35 PM 06/28/2023    2:24 PM 06/08/2023   12:07 PM  CBC  WBC 4.0 - 10.5 K/uL 5.1  6.2  5.0   Hemoglobin 13.0 - 17.0 g/dL 62.1  30.8  65.7   Hematocrit 39.0 - 52.0 % 38.0  37.4  39.1   Platelets 150 - 400 K/uL 324  317  379       Latest Ref Rng & Units 07/20/2023   12:35 PM 06/28/2023    2:24 PM 06/08/2023   12:07 PM  CMP  Glucose 70 - 99 mg/dL 846  962  952   BUN 6 - 20 mg/dL 9  10  10    Creatinine 0.61 - 1.24 mg/dL 8.41  3.24  4.01   Sodium 135 - 145 mmol/L 136  137  138   Potassium 3.5 - 5.1 mmol/L 4.3  4.1  4.0   Chloride 98 - 111 mmol/L 104  104  105   CO2 22 - 32  mmol/L 28  28  28    Calcium 8.9 - 10.3 mg/dL 9.0  9.3  9.3   Total Protein 6.5 - 8.1 g/dL 6.9  6.8  7.0   Total Bilirubin 0.0 - 1.2 mg/dL 0.4  0.4  0.4   Alkaline Phos 38 - 126 U/L 111  128  117   AST 15 - 41 U/L 15  15  13    ALT 0 - 44 U/L 10  11  8     Lab Results  Component Value Date   IRON 16 (L) 05/05/2022   TIBC 448 05/05/2022   FERRITIN 52 10/20/2022    01/22/2019 Foundation One: Tumor Mutational Burden     01/22/2019 PD-L1 Immunohistochemistry Analysis    01/22/2019 Soft Tissue Needle Core Biopsy Surgical Pathology     RADIOGRAPHIC STUDIES: I have personally reviewed the radiological images as listed and agreed with the findings in the report. XR Lumbar Spine 2-3 Views Result Date: 06/21/2023 X-ray lumbar spine 2 views Rotational effect on AP view but possible lumbar scoliosis present.  May be mild degenerative disc changes contributing.  No significant appearing spondylolisthesis.  Vertebral bodies are well-preserved.  No significant appearing endplate osteophytes.  XR HIP UNILAT W OR W/O PELVIS 2-3 VIEWS RIGHT Result Date: 06/21/2023 X-ray right hip 2 views Right hip joint space appears preserved.  No significant marginal bone spurring or overhang.  No obvious sclerotic lesions.  SI joints appear patent bilaterally with some degenerative appearing changes no increased sclerosis erosions or joint widening present. Impression Hip joint appears normal, mild appearing osteoarthritis of SI joints  XR Hand 2 View Right Result Date: 06/21/2023 X-ray right hand 2  views Radiocarpal and carpal joints appear normal.  There is a radiopaque object present on the ulnar side at the third metacarpal was consistent for a metal pellet.  MCP joint spaces are preserved few subchondral cyst changes.  PIP and DIP joints appear normal.  No erosions or abnormal calcifications seen.  Bone mineralization appears normal. Impression Very mild appearing degenerative changes   XR Hand 2 View  Left Result Date: 06/21/2023 X-ray left hand 2 views Radiocarpal carpal joints appear normal.  MCP joints appear normal.  PIP and DIP joints appear normal.  No erosions or abnormal calcifications seen.  Bone mineralization appears normal. Impression Normal hand x-ray     ASSESSMENT & PLAN:   This is a 58 year old male with   1. Metastatic Poorly differentiated lung adenocarcinoma Presented with Large neck mass, mediastinal mass, renal mass, and questionable bone lesions  no brain mets on MRI brain 01/22/2019 PD-L1 Immunohistochemistry Analysis which revealed "Tumor Proportion Score (TPS) 50%" 05/16/2019 PET/CT ([id number]) revealed "Radiation changes in the right hemithorax, as above. Improving mediastinal nodal metastases. Prior bulky right cervical metastases have resolved. No evidence of metastatic disease in the abdomen/pelvis." 07/19/2019 Esophagus Scan ([id number]) revealed "Mild stricture at the C6-7 level likely related to prior esophageal surgery. Barium tablet was slow to pass through this area but did pass through after 1 minutes. Hiatal hernia with moderate gastroesophageal reflux. Moderate stricture above the hiatal hernia likely due to reflux. Barium tablet did not pass this area. There are changes of esophagitis which are likely due to reflux and possibly radiation as well."  10/10/19 of  CT Chest W Contrast ([id number])- no evidence of lung cancer progression at this time. He did have a PET CT scan on 04/13/2021 which was reviewed on the previous visit by Karena Addison.  This showed some borderline FDG avid right paratracheal right hilar and azygoesophageal lymph nodes that were suspicious for possible nodal recurrence however the patient notes that he did have a bad upper respiratory tract infection around the time that he had his PET CT scan and therefore reactive adenopathy is a possibility as well.    2. 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 Symptomatic hemorrhoids-currently no significant bleeding but have caused some mild iron deficiency anemia.  5. Normocytic anemia -Likely due to underlying malignancy + Iron deficiency due to ongoing bleeding from hemorrhoids   6. S/pThrombocytosis -- likely due to paraneoplastic effect of tumor and reactive due to inflammation and tissue inflammation from RT.-- now resolved.   7. Nicotine dependence -has quit since cancer diagnosis  8. Iron deficiency - likely due to blood loss from hemorrhoids  9.  Erectile dysfunction-having partial benefits with using Viagra.  PLAN:  -Discussed lab results from today, 07/20/2023, in detail with the patient. CBC is stable overall with slightly low Hgb of 12.1 g/dL with Hct of 63.8%.  -Recommend to follow-up with his surgeon regarding pain/ itchiness near the site of hernia surgery.  -continue Zyrtec and Pepcid once a day to control skin rashes -Patient has no lab or clinical evidence of lung cancer recurrence/progression at this time. -patient has no notable toxicities with immunotherapy at this time -continue Atezolizumab immunotherapy -continue Eliquis 2.5mg  BID -Whole-body scan next month.  -Answered all of patient's questions.  -Continue to follow-up with Rheumatologist.    FOLLOW UP: CT CAP in 4 weeks  Plz continue Atezolizumab per integrated scheduling MD visit in 6 weeks  The total time spent in the appointment was 30 minutes* .  All of the patient's questions were answered with apparent satisfaction. The patient knows to call the clinic with any problems, questions or concerns.   [name] MD MS AAHIVMS [facility] Hematology/Oncology Physician [facility]  .*Total Encounter Time as defined by the Centers for Medicare and Medicaid Services includes, in addition to the face-to-face time of a patient visit (documented in the  note above) non-face-to-face time: obtaining and reviewing outside history, ordering and reviewing medications, tests or procedures, care coordination (communications with other health care professionals or caregivers) and documentation in the medical record.   I,Param Shah,acting as a Neurosurgeon for [name], MD.,have documented all relevant documentation on the behalf of [name], MD,as directed by  [name], MD while in the presence of [name], MD.  .I have reviewed the above documentation for accuracy and completeness, and I agree with the above. [name] MD

## 2023-07-25 ENCOUNTER — Encounter: Payer: Self-pay | Admitting: Physician Assistant

## 2023-07-25 ENCOUNTER — Encounter: Payer: Self-pay | Admitting: Hematology

## 2023-08-05 ENCOUNTER — Encounter: Payer: Self-pay | Admitting: Hematology

## 2023-08-05 ENCOUNTER — Encounter: Payer: Self-pay | Admitting: Physician Assistant

## 2023-08-08 ENCOUNTER — Encounter: Payer: Self-pay | Admitting: Hematology

## 2023-08-08 ENCOUNTER — Encounter: Payer: Self-pay | Admitting: Physician Assistant

## 2023-08-10 ENCOUNTER — Inpatient Hospital Stay: Payer: Medicare HMO

## 2023-08-10 ENCOUNTER — Inpatient Hospital Stay: Payer: Medicare HMO | Attending: Physician Assistant

## 2023-08-10 VITALS — BP 113/76 | HR 70 | Temp 98.4°F | Resp 15 | Wt 133.8 lb

## 2023-08-10 DIAGNOSIS — C3491 Malignant neoplasm of unspecified part of right bronchus or lung: Secondary | ICD-10-CM | POA: Diagnosis present

## 2023-08-10 DIAGNOSIS — C771 Secondary and unspecified malignant neoplasm of intrathoracic lymph nodes: Secondary | ICD-10-CM | POA: Diagnosis present

## 2023-08-10 DIAGNOSIS — D509 Iron deficiency anemia, unspecified: Secondary | ICD-10-CM | POA: Diagnosis not present

## 2023-08-10 DIAGNOSIS — Z5112 Encounter for antineoplastic immunotherapy: Secondary | ICD-10-CM | POA: Insufficient documentation

## 2023-08-10 DIAGNOSIS — Z7189 Other specified counseling: Secondary | ICD-10-CM

## 2023-08-10 DIAGNOSIS — Z79899 Other long term (current) drug therapy: Secondary | ICD-10-CM | POA: Diagnosis not present

## 2023-08-10 DIAGNOSIS — Z95828 Presence of other vascular implants and grafts: Secondary | ICD-10-CM

## 2023-08-10 DIAGNOSIS — Z87891 Personal history of nicotine dependence: Secondary | ICD-10-CM | POA: Insufficient documentation

## 2023-08-10 LAB — CMP (CANCER CENTER ONLY)
ALT: 10 U/L (ref 0–44)
AST: 15 U/L (ref 15–41)
Albumin: 4.2 g/dL (ref 3.5–5.0)
Alkaline Phosphatase: 122 U/L (ref 38–126)
Anion gap: 6 (ref 5–15)
BUN: 9 mg/dL (ref 6–20)
CO2: 28 mmol/L (ref 22–32)
Calcium: 9.4 mg/dL (ref 8.9–10.3)
Chloride: 103 mmol/L (ref 98–111)
Creatinine: 0.79 mg/dL (ref 0.61–1.24)
GFR, Estimated: >60 mL/min (ref >60)
Glucose, Bld: 90 mg/dL (ref 70–99)
Potassium: 4.4 mmol/L (ref 3.5–5.1)
Sodium: 137 mmol/L (ref 135–145)
Total Bilirubin: 0.3 mg/dL (ref 0.0–1.2)
Total Protein: 7.2 g/dL (ref 6.5–8.1)

## 2023-08-10 LAB — CBC WITH DIFFERENTIAL (CANCER CENTER ONLY)
Abs Immature Granulocytes: 0.01 10*3/uL (ref 0.00–0.07)
Basophils Absolute: 0.1 10*3/uL (ref 0.0–0.1)
Basophils Relative: 1 %
Eosinophils Absolute: 0.1 10*3/uL (ref 0.0–0.5)
Eosinophils Relative: 1 %
HCT: 38.3 % — ABNORMAL LOW (ref 39.0–52.0)
Hemoglobin: 12.1 g/dL — ABNORMAL LOW (ref 13.0–17.0)
Immature Granulocytes: 0 %
Lymphocytes Relative: 23 %
Lymphs Abs: 1.4 10*3/uL (ref 0.7–4.0)
MCH: 26.7 pg (ref 26.0–34.0)
MCHC: 31.6 g/dL (ref 30.0–36.0)
MCV: 84.4 fL (ref 80.0–100.0)
Monocytes Absolute: 0.9 10*3/uL (ref 0.1–1.0)
Monocytes Relative: 15 %
Neutro Abs: 3.6 10*3/uL (ref 1.7–7.7)
Neutrophils Relative %: 60 %
Platelet Count: 405 10*3/uL — ABNORMAL HIGH (ref 150–400)
RBC: 4.54 MIL/uL (ref 4.22–5.81)
RDW: 13.9 % (ref 11.5–15.5)
WBC Count: 6 10*3/uL (ref 4.0–10.5)
nRBC: 0 % (ref 0.0–0.2)

## 2023-08-10 LAB — TSH: TSH: 2.545 u[IU]/mL (ref 0.350–4.500)

## 2023-08-10 MED ORDER — DIPHENHYDRAMINE HCL 25 MG PO CAPS
25.0000 mg | ORAL_CAPSULE | Freq: Once | ORAL | Status: AC
  Administered 2023-08-10: 25 mg via ORAL
  Filled 2023-08-10: qty 1

## 2023-08-10 MED ORDER — SODIUM CHLORIDE 0.9 % IV SOLN: Freq: Once | INTRAVENOUS | Status: AC

## 2023-08-10 MED ORDER — SODIUM CHLORIDE 0.9% FLUSH
10.0000 mL | INTRAVENOUS | Status: DC | PRN
  Administered 2023-08-10: 10 mL

## 2023-08-10 MED ORDER — FAMOTIDINE 20 MG PO TABS
20.0000 mg | ORAL_TABLET | Freq: Once | ORAL | Status: AC
  Administered 2023-08-10: 20 mg via ORAL
  Filled 2023-08-10: qty 1

## 2023-08-10 MED ORDER — SODIUM CHLORIDE 0.9 % IV SOLN
1200.0000 mg | Freq: Once | INTRAVENOUS | Status: AC
  Administered 2023-08-10: 1200 mg via INTRAVENOUS
  Filled 2023-08-10: qty 20

## 2023-08-10 MED ORDER — ACETAMINOPHEN 325 MG PO TABS
650.0000 mg | ORAL_TABLET | Freq: Once | ORAL | Status: AC
  Administered 2023-08-10: 650 mg via ORAL
  Filled 2023-08-10: qty 2

## 2023-08-10 NOTE — Patient Instructions (Signed)
 [facility] - A DEPT OF [facility]  Discharge Instructions: Thank you for choosing [facility] to provide your oncology and hematology care.   If you have a lab appointment with the Cancer Center, please go directly to the Cancer Center and check in at the registration area.   Wear comfortable clothing and clothing appropriate for easy access to any Portacath or PICC line.   We strive to give you quality time with your provider. You may need to reschedule your appointment if you arrive late (15 or more minutes).  Arriving late affects you and other patients whose appointments are after yours.  Also, if you miss three or more appointments without notifying the office, you may be dismissed from the clinic at the provider's discretion.      For prescription refill requests, have your pharmacy contact our office and allow 72 hours for refills to be completed.    Today you received the following chemotherapy and/or immunotherapy agents: Tecentriq      To help prevent nausea and vomiting after your treatment, we encourage you to take your nausea medication as directed.  BELOW ARE SYMPTOMS THAT SHOULD BE REPORTED IMMEDIATELY: *FEVER GREATER THAN 100.4 F (38 C) OR HIGHER *CHILLS OR SWEATING *NAUSEA AND VOMITING THAT IS NOT CONTROLLED WITH YOUR NAUSEA MEDICATION *UNUSUAL SHORTNESS OF BREATH *UNUSUAL BRUISING OR BLEEDING *URINARY PROBLEMS (pain or burning when urinating, or frequent urination) *BOWEL PROBLEMS (unusual diarrhea, constipation, pain near the anus) TENDERNESS IN MOUTH AND THROAT WITH OR WITHOUT PRESENCE OF ULCERS (sore throat, sores in mouth, or a toothache) UNUSUAL RASH, SWELLING OR PAIN  UNUSUAL VAGINAL DISCHARGE OR ITCHING   Items with * indicate a potential emergency and should be followed up as soon as possible or go to the Emergency Department if any problems should occur.  Please show the CHEMOTHERAPY ALERT CARD or IMMUNOTHERAPY  ALERT CARD at check-in to the Emergency Department and triage nurse.  Should you have questions after your visit or need to cancel or reschedule your appointment, please contact [facility]  Dept: [phone]  and follow the prompts.  Office hours are 8:00 a.m. to 4:30 p.m. Monday - Friday. Please note that voicemails left after 4:00 p.m. may not be returned until the following business day.  We are closed weekends and major holidays. You have access to a nurse at all times for urgent questions. Please call the main number to the clinic Dept: [phone] and follow the prompts.   For any non-urgent questions, you may also contact your provider using MyChart. We now offer e-Visits for anyone 62 and older to request care online for non-urgent symptoms. For details visit [email or web address].   Also download the MyChart app! Go to the app store, search "MyChart", open the app, select [place], and log in with your MyChart username and password.

## 2023-08-11 NOTE — Progress Notes (Unsigned)
 Office Visit Note  Patient: David Berry.             Date of Birth: 03/22/1966           MRN: [id number]             PCP: [name], MD Referring: [name], MD Visit Date: 08/12/2023   Subjective:  No chief complaint on file.   History of Present Illness: David Berry. is a 58 y.o. male here for follow up ***   Previous HPI 06/21/23 David Berry. is a 58 year old male here for evaluation of rashes, joint pain, and abnormal blood test results with positive dsDNA. He was referred by Dr. Murrell Converse who saw him on account of urticarial rashes, which did improve on subsequent treatment with high dose zyrtec and pepcid.   He has been on Tecentriq (PD-L1) for lung cancer since 2020, with no changes to this treatment in the last year.   Rashes have been present since January of last year, initially associated with starting a statin medication. The rashes appear as hives, affecting his sides, back, legs, and shoulders, and are described as itchy but not painful. They typically last overnight, leaving redness the next morning. The rashes subsided while on Zyrtec and Pepcid, but recurred after discontinuation of these medications.   Joint pain and swelling have been present since last year, coinciding with the onset of the rashes. He describes a tingling sensation in his fingers and swelling in his hands. No prior history of similar symptoms before last year. He has not been on any steroids recently, although he used them a few years ago for back problems.    He has experienced new onset headaches over the past few months, described as short and progressive.   He has a history of falling on his right side at the end of the year before last, which he associates with current hip pain. He has not had any x-rays for this issue. He describes the pain as arthritic, particularly when moving or getting up, and notes it is worse since stopping Zyrtec and Pepcid.   Labs  reviewed 04/2023 dsDNA 243 RNP, Sm, SSA, SSB neg ESR 43   No Rheumatology ROS completed.   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/26/2019   Former smoker 02/26/2019   Adenocarcinoma of right lung (HCC) 02/05/2019   Counseling regarding advance care planning and goals of care 02/05/2019   Acute deep vein thrombosis (DVT) of non-extremity vein    Adenocarcinoma of lung (HCC)    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   Internal jugular (IJ) vein thromboembolism, acute, right (HCC) 01/24/2019   Mass of right side of neck, s/p Radiation therapy 01/24/2019   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   Right renal mass 01/24/2019   Hip mass, right 01/24/2019   Thyromegaly 01/24/2019   Extravasation injury of IV catheter site with other complication (HCC)    BACK PAIN, LUMBAR 03/24/2010   LUMBAR RADICULOPATHY 03/24/2010  Past Medical History:  Diagnosis Date   Anxiety    Bipolar disorder (HCC)    Blood in stool    Cancer (HCC)    Chronic bronchitis with emphysema (HCC)    pt denies this   Eczema    GERD (gastroesophageal reflux disease)    Lung cancer (HCC) dx'd 01/2019   Peripheral vascular disease (HCC)    blood clot in neck Sept 12, 2020    Family History  Problem Relation Age of Onset   Prostate cancer Father    Colon cancer Neg Hx    Stomach cancer Neg Hx    Esophageal cancer Neg Hx    Past Surgical History:  Procedure Laterality Date   APPENDECTOMY     teenager    INGUINAL HERNIA REPAIR Right 2016, 2017   [facility], [facility]-removed mesh   IR IMAGING GUIDED PORT INSERTION  04/26/2019   TONSILLECTOMY     TOOTH EXTRACTION N/A 03/14/2020   Procedure: DENTAL RESTORATION/EXTRACTIONS;  Surgeon: Ocie Doyne, DDS;  Location: [facility] OR;  Service: Oral Surgery;  Laterality: N/A;   Social History   Social History Narrative   Lives alone   Caffeine- coffee, 20 oz daily, tea occas    There is no immunization history on file for this patient.   Objective: Vital Signs: There were no vitals taken for this visit.   Physical Exam   Musculoskeletal Exam: ***  CDAI Exam: CDAI Score: -- Patient Global: --; Provider Global: -- Swollen: --; Tender: -- Joint Exam 08/12/2023   No joint exam has been documented for this visit   There is currently no information documented on the homunculus. Go to the Rheumatology activity and complete the homunculus joint exam.  Investigation: No additional findings.  Imaging: No results found.  Recent Labs: Lab Results  Component Value Date   WBC 6.0 08/10/2023   HGB 12.1 (L) 08/10/2023   PLT 405 (H) 08/10/2023   NA 137 08/10/2023   K 4.4 08/10/2023   CL 103 08/10/2023   CO2 28 08/10/2023   GLUCOSE 90 08/10/2023   BUN 9 08/10/2023   CREATININE 0.79 08/10/2023   BILITOT 0.3 08/10/2023   ALKPHOS 122 08/10/2023   AST 15 08/10/2023   ALT 10 08/10/2023   PROT 7.2 08/10/2023   ALBUMIN 4.2 08/10/2023   CALCIUM 9.4 08/10/2023   GFRAA >60 01/30/2020    Speciality Comments: No specialty comments available.  Procedures:  No procedures performed Allergies: Atorvastatin calcium   Assessment / Plan:     Visit Diagnoses: No diagnosis found.  ***  Orders: No orders of the defined types were placed in this encounter.  No orders of the defined types were placed in this encounter.    Follow-Up Instructions: No follow-ups on file.   Fuller Plan, MD  Note - This record has been created  using [employer].  Chart creation errors have been sought, but may not always  have been located. Such creation errors do not reflect on  the standard of medical care.

## 2023-08-12 ENCOUNTER — Encounter: Payer: Self-pay | Admitting: Internal Medicine

## 2023-08-12 ENCOUNTER — Ambulatory Visit: Payer: Medicare HMO | Attending: Internal Medicine | Admitting: Internal Medicine

## 2023-08-12 VITALS — BP 91/61 | HR 73 | Resp 14 | Ht 70.0 in | Wt 131.0 lb

## 2023-08-12 DIAGNOSIS — M7989 Other specified soft tissue disorders: Secondary | ICD-10-CM

## 2023-08-12 DIAGNOSIS — R768 Other specified abnormal immunological findings in serum: Secondary | ICD-10-CM

## 2023-08-12 MED ORDER — PREDNISONE 5 MG PO TABS: 5.0000 mg | ORAL_TABLET | Freq: Every day | ORAL | 2 refills | Status: DC | PRN

## 2023-08-13 ENCOUNTER — Other Ambulatory Visit: Payer: Self-pay

## 2023-08-15 ENCOUNTER — Encounter (HOSPITAL_COMMUNITY): Payer: Self-pay

## 2023-08-15 ENCOUNTER — Ambulatory Visit (HOSPITAL_COMMUNITY)
Admission: RE | Admit: 2023-08-15 | Discharge: 2023-08-15 | Disposition: A | Discharge status: Home / Self Care | Source: Ambulatory Visit | Attending: Hematology | Admitting: Hematology

## 2023-08-15 DIAGNOSIS — Z5112 Encounter for antineoplastic immunotherapy: Secondary | ICD-10-CM | POA: Insufficient documentation

## 2023-08-15 DIAGNOSIS — C3491 Malignant neoplasm of unspecified part of right bronchus or lung: Secondary | ICD-10-CM | POA: Insufficient documentation

## 2023-08-15 MED ORDER — HEPARIN SOD (PORK) LOCK FLUSH 100 UNIT/ML IV SOLN
500.0000 [IU] | Freq: Once | INTRAVENOUS | Status: AC
  Administered 2023-08-15: 500 [IU] via INTRAVENOUS

## 2023-08-15 MED ORDER — SODIUM CHLORIDE (PF) 0.9 % IJ SOLN
INTRAMUSCULAR | Status: AC
  Filled 2023-08-15: qty 50

## 2023-08-15 MED ORDER — HEPARIN SOD (PORK) LOCK FLUSH 100 UNIT/ML IV SOLN
INTRAVENOUS | Status: AC
  Filled 2023-08-15: qty 5

## 2023-08-15 MED ORDER — IOHEXOL 300 MG/ML  SOLN
100.0000 mL | Freq: Once | INTRAMUSCULAR | Status: AC | PRN
  Administered 2023-08-15: 100 mL via INTRAVENOUS

## 2023-08-31 ENCOUNTER — Other Ambulatory Visit: Payer: Self-pay

## 2023-08-31 ENCOUNTER — Inpatient Hospital Stay (HOSPITAL_BASED_OUTPATIENT_CLINIC_OR_DEPARTMENT_OTHER): Admitting: Hematology

## 2023-08-31 ENCOUNTER — Inpatient Hospital Stay

## 2023-08-31 VITALS — BP 116/80 | HR 65 | Temp 97.7°F | Resp 19 | Wt 135.1 lb

## 2023-08-31 DIAGNOSIS — C3491 Malignant neoplasm of unspecified part of right bronchus or lung: Secondary | ICD-10-CM

## 2023-08-31 DIAGNOSIS — Z7189 Other specified counseling: Secondary | ICD-10-CM | POA: Diagnosis not present

## 2023-08-31 DIAGNOSIS — Z5112 Encounter for antineoplastic immunotherapy: Secondary | ICD-10-CM | POA: Diagnosis not present

## 2023-08-31 DIAGNOSIS — Z95828 Presence of other vascular implants and grafts: Secondary | ICD-10-CM

## 2023-08-31 LAB — CBC WITH DIFFERENTIAL (CANCER CENTER ONLY)
Abs Immature Granulocytes: 0.01 10*3/uL (ref 0.00–0.07)
Basophils Absolute: 0 10*3/uL (ref 0.0–0.1)
Basophils Relative: 1 %
Eosinophils Absolute: 0 10*3/uL (ref 0.0–0.5)
Eosinophils Relative: 1 %
HCT: 34.2 % — ABNORMAL LOW (ref 39.0–52.0)
Hemoglobin: 11.1 g/dL — ABNORMAL LOW (ref 13.0–17.0)
Immature Granulocytes: 0 %
Lymphocytes Relative: 19 %
Lymphs Abs: 1 10*3/uL (ref 0.7–4.0)
MCH: 26.4 pg (ref 26.0–34.0)
MCHC: 32.5 g/dL (ref 30.0–36.0)
MCV: 81.4 fL (ref 80.0–100.0)
Monocytes Absolute: 0.8 10*3/uL (ref 0.1–1.0)
Monocytes Relative: 15 %
Neutro Abs: 3.5 10*3/uL (ref 1.7–7.7)
Neutrophils Relative %: 64 %
Platelet Count: 278 10*3/uL (ref 150–400)
RBC: 4.2 MIL/uL — ABNORMAL LOW (ref 4.22–5.81)
RDW: 14.3 % (ref 11.5–15.5)
WBC Count: 5.4 10*3/uL (ref 4.0–10.5)
nRBC: 0 % (ref 0.0–0.2)

## 2023-08-31 LAB — CMP (CANCER CENTER ONLY)
ALT: 10 U/L (ref 0–44)
AST: 16 U/L (ref 15–41)
Albumin: 4.1 g/dL (ref 3.5–5.0)
Alkaline Phosphatase: 109 U/L (ref 38–126)
Anion gap: 4 — ABNORMAL LOW (ref 5–15)
BUN: 8 mg/dL (ref 6–20)
CO2: 29 mmol/L (ref 22–32)
Calcium: 9.5 mg/dL (ref 8.9–10.3)
Chloride: 105 mmol/L (ref 98–111)
Creatinine: 0.81 mg/dL (ref 0.61–1.24)
GFR, Estimated: >60 mL/min (ref >60)
Glucose, Bld: 105 mg/dL — ABNORMAL HIGH (ref 70–99)
Potassium: 4.1 mmol/L (ref 3.5–5.1)
Sodium: 138 mmol/L (ref 135–145)
Total Bilirubin: 0.3 mg/dL (ref 0.0–1.2)
Total Protein: 6.9 g/dL (ref 6.5–8.1)

## 2023-08-31 MED ORDER — SODIUM CHLORIDE 0.9% FLUSH
10.0000 mL | INTRAVENOUS | Status: DC | PRN
  Administered 2023-08-31: 10 mL

## 2023-08-31 MED ORDER — HEPARIN SOD (PORK) LOCK FLUSH 100 UNIT/ML IV SOLN
500.0000 [IU] | Freq: Once | INTRAVENOUS | Status: AC | PRN
  Administered 2023-08-31: 500 [IU]

## 2023-08-31 NOTE — Progress Notes (Signed)
 Pt states he is unable to stay for treatment today, able to reschedule for treatment 09/01/23 in the morning, pt aware and agrees.

## 2023-08-31 NOTE — Progress Notes (Signed)
 HEMATOLOGY/ONCOLOGY CLINIC NOTE  Date of Service: 08/31/23  Patient Care Team: Lorella Roles, MD as PCP - General (Family Medicine)   CHIEF COMPLAINTS/PURPOSE OF CONSULTATION:  Follow-up for continued evaluation and management of metastatic lung adenocarcinoma  HISTORY OF PRESENTING ILLNESS:  Please see previous notes for details on initial presentation.  Current Treatment:  Atezolizumab  maintenance   INTERVAL HISTORY:   David Berry. Is a 58 y.o. male here for continued evaluation and management of his metastatic lung adenocarcinoma.    Patient was last seen by me on 07/20/2023 and complained of abdominal pain at the site of hernia surgery, left lower back pain that radiates to his left leg and occasional bilateral hand swelling.   Today, he reports that he has ben doing well over the last couple of months.   He reports endorsing skin rash with itching over the weekend managed with Cetirizine  and Famotidine  once daily. He notes that his skin rashes recur when not taking medication. He denies any certain food triggers, insect bites, or other associations with skin rashes.   His hemorrhoids have been stable and he has a bowel movmeent every 2 days.   He has been eating well and denies any unexpected weight loss.   He complains of finger swelling. Patient reports pain in his middle finger joint when squeezing it.   His back is occasionally painful and reports hx of degenerative disc disease. He denies any involvement of rheumatologic processes such as arthritis.   He denies any need for medication refill at this time.   Patient reports being told to be on short-course low-dose prednisone  one tablet by mouth, which he has not started yet.   His port has been stable.   He denies any smoking at this time and notes that he quit smoking in September 2020.   MEDICAL HISTORY:  Past Medical History:  Diagnosis Date   Anxiety    Bipolar disorder (HCC)    Blood  in stool    Cancer (HCC)    Chronic bronchitis with emphysema (HCC)    pt denies this   Eczema    GERD (gastroesophageal reflux disease)    Lung cancer (HCC) dx'd 01/2019   Peripheral vascular disease (HCC)    blood clot in neck Sept 12, 2020    SURGICAL HISTORY: Past Surgical History:  Procedure Laterality Date   APPENDECTOMY     teenager   INGUINAL HERNIA REPAIR Right 2016, 2017   [facility], 2018 [facility]-removed mesh   IR IMAGING GUIDED PORT INSERTION  04/26/2019   TONSILLECTOMY     TOOTH EXTRACTION N/A 03/14/2020   Procedure: DENTAL RESTORATION/EXTRACTIONS;  Surgeon: Ascencion Lava, DDS;  Location: [facility] OR;  Service: Oral Surgery;  Laterality: N/A;    SOCIAL HISTORY: Social History   Socioeconomic History   Marital status: Single    Spouse name: Not on file   Number of children: 1   Years of education: GED   Highest education level: Some college, no degree  Occupational History    Comment: fork lift driver  Tobacco Use   Smoking status: Former    Types: Cigars    Quit date: 01/20/2019    Years since quitting: 4.6    Passive exposure: Current   Smokeless tobacco: Never   Tobacco comments:    smokes 3 black and mild cigars per day  Vaping Use   Vaping status: Never Used  Substance and Sexual Activity   Alcohol use: Not  Currently   Drug use: Yes    Types: Marijuana   Sexual activity: Yes  Other Topics Concern   Not on file  Social History Narrative   Lives alone   Caffeine- coffee, 20 oz daily, tea occas   Social Drivers of Health   Financial Resource Strain: Low Risk  (09/29/2020)   Overall Financial Resource Strain (CARDIA)    Difficulty of Paying Living Expenses: Not very hard  Food Insecurity: No Food Insecurity (09/29/2020)   Hunger Vital Sign    Worried About Running Out of Food in the Last Year: Never true    Ran Out of Food in the Last Year: Never true  Transportation Needs: No Transportation Needs (09/29/2020)   PRAPARE - Therapist, art (Medical): No    Lack of Transportation (Non-Medical): No  Physical Activity: Sufficiently Active (09/29/2020)   Exercise Vital Sign    Days of Exercise per Week: 3 days    Minutes of Exercise per Session: 150+ min  Stress: No Stress Concern Present (09/29/2020)   [employer] of Occupational Health - Occupational Stress Questionnaire    Feeling of Stress : Not at all  Social Connections: Unknown (03/01/2022)   Received from [facility], [facility]   Social Network    Social Network: Not on file  Intimate Partner Violence: Unknown (03/01/2022)   Received from [facility], [facility]   HITS    Physically Hurt: Not on file    Insult or Talk Down To: Not on file    Threaten Physical Harm: Not on file    Scream or Curse: Not on file    FAMILY HISTORY: Family History  Problem Relation Age of Onset   Prostate cancer Father    Colon cancer Neg Hx    Stomach cancer Neg Hx    Esophageal cancer Neg Hx     ALLERGIES:  is allergic to atorvastatin calcium.  MEDICATIONS:  Current Outpatient Medications  Medication Sig Dispense Refill   cetirizine  (ZYRTEC  ALLERGY ) 10 MG tablet Take 1 tablet (10 mg total) by mouth 2 (two) times daily as needed (hives). 60 tablet 1   Cyanocobalamin (VITAMIN B 12 PO) Take 2,500 mcg by mouth daily.     ELIQUIS  2.5 MG TABS tablet TAKE 1 TABLET(2.5 MG) BY MOUTH TWICE DAILY 60 tablet 5   esomeprazole  (NEXIUM ) 40 MG capsule TAKE 1 CAPSULE(40 MG) BY MOUTH DAILY 90 capsule 2   famotidine  (PEPCID ) 20 MG tablet Take 1 tablet (20 mg total) by mouth 2 (two) times daily. 60 tablet 1   lidocaine -prilocaine  (EMLA ) cream Apply 1 Application topically as needed. 30 g 0   ondansetron  (ZOFRAN ) 4 MG tablet Take 1 tablet (4 mg total) by mouth every 8 (eight) hours as needed for nausea or vomiting. 30 tablet 1   predniSONE  (DELTASONE ) 5 MG tablet Take 1 tablet (5 mg total) by mouth daily as needed. 30 tablet 2   propranolol  (INDERAL ) 20 MG  tablet TAKE 1 TABLET(20 MG) BY MOUTH TWICE DAILY 60 tablet 5   sildenafil  (VIAGRA ) 50 MG tablet Take 1 tablet (50 mg total) by mouth daily as needed for erectile dysfunction. Take 30 mins to 4 hours prior to sexual activity 30 tablet 1   No current facility-administered medications for this visit.   Facility-Administered Medications Ordered in Other Visits  Medication Dose Route Frequency Provider Last Rate Last Admin   sodium chloride  flush (NS) 0.9 % injection 10 mL  10 mL Intracatheter PRN  Frankie Israel, MD   10 mL at 05/18/23 1559   REVIEW OF SYSTEMS:    10 Point review of Systems was done is negative except as noted above.   PHYSICAL EXAMINATION: ECOG FS:1 - Symptomatic but completely ambulatory .There were no vitals taken for this visit.  Wt Readings from Last 3 Encounters:  08/12/23 131 lb (59.4 kg)  08/10/23 133 lb 12 oz (60.7 kg)  07/20/23 132 lb 8 oz (60.1 kg)   There is no height or weight on file to calculate BMI.     GENERAL:alert, in no acute distress and comfortable SKIN: no acute rashes, no significant lesions EYES: conjunctiva are pink and non-injected, sclera anicteric OROPHARYNX: MMM, no exudates, no oropharyngeal erythema or ulceration NECK: supple, no JVD LYMPH:  no palpable lymphadenopathy in the cervical, axillary or inguinal regions LUNGS: clear to auscultation b/l with normal respiratory effort HEART: regular rate & rhythm ABDOMEN:  normoactive bowel sounds , non tender, not distended. Extremity: no pedal edema PSYCH: alert & oriented x 3 with fluent speech NEURO: no focal motor/sensory deficits   LABORATORY DATA:  I have reviewed the data as listed    Latest Ref Rng & Units 08/10/2023    1:18 PM 07/20/2023   12:35 PM 06/28/2023    2:24 PM  CBC  WBC 4.0 - 10.5 K/uL 6.0  5.1  6.2   Hemoglobin 13.0 - 17.0 g/dL 41.3  24.4  01.0   Hematocrit 39.0 - 52.0 % 38.3  38.0  37.4   Platelets 150 - 400 K/uL 405  324  317       Latest Ref Rng & Units  08/10/2023    1:18 PM 07/20/2023   12:35 PM 06/28/2023    2:24 PM  CMP  Glucose 70 - 99 mg/dL 90  272  536   BUN 6 - 20 mg/dL 9  9  10    Creatinine 0.61 - 1.24 mg/dL 6.44  0.34  7.42   Sodium 135 - 145 mmol/L 137  136  137   Potassium 3.5 - 5.1 mmol/L 4.4  4.3  4.1   Chloride 98 - 111 mmol/L 103  104  104   CO2 22 - 32 mmol/L 28  28  28    Calcium 8.9 - 10.3 mg/dL 9.4  9.0  9.3   Total Protein 6.5 - 8.1 g/dL 7.2  6.9  6.8   Total Bilirubin 0.0 - 1.2 mg/dL 0.3  0.4  0.4   Alkaline Phos 38 - 126 U/L 122  111  128   AST 15 - 41 U/L 15  15  15    ALT 0 - 44 U/L 10  10  11     Lab Results  Component Value Date   IRON  16 (L) 05/05/2022   TIBC 448 05/05/2022   FERRITIN 52 10/20/2022    01/22/2019 [facility]: Tumor Mutational Burden     01/22/2019 PD-L1 Immunohistochemistry Analysis    01/22/2019 Soft Tissue Needle Core Biopsy Surgical Pathology     RADIOGRAPHIC STUDIES: I have personally reviewed the radiological images as listed and agreed with the findings in the report. CT CHEST ABDOMEN PELVIS W CONTRAST Result Date: 08/15/2023 CLINICAL DATA:  History of non-small cell lung cancer, metastatic. Assess treatment response * Tracking Code: BO * EXAM: CT CHEST, ABDOMEN, AND PELVIS WITH CONTRAST TECHNIQUE: Multidetector CT imaging of the chest, abdomen and pelvis was performed following the standard protocol during bolus administration of intravenous contrast. RADIATION DOSE REDUCTION: This exam was  performed according to the departmental dose-optimization program which includes automated exposure control, adjustment of the mA and/or kV according to patient size and/or use of iterative reconstruction technique. CONTRAST:  OMNIPAQUE  IOHEXOL  300 MG/ML  SOLN COMPARISON:  Multiple priors including MRI April 19, 2023 and CT March 28, 2019 FINDINGS: CT CHEST FINDINGS Cardiovascular: Accessed left chest Port-A-Cath with tip near the superior cavoatrial junction. Normal size heart.  Coronary artery calcifications. Aortic atherosclerosis. Mediastinum/Nodes: No suspicious thyroid  nodule. No pathologically enlarged mediastinal, hilar or axillary lymph nodes. Similar patulous esophagus with long segment esophageal wall thickening and [place] off a GIA low/mediastinal fluid, this is relatively unchanged over multiple prior examinations and possibly reflecting sequela prior radiation. Similar right paratracheal and right hilar soft tissue. Lungs/Pleura: Similar masslike fibrosis with architectural distortion in the right greater than left paramediastinal bilateral upper and lower lobes, again favored to reflect post radiation change. There is no new suspicious nodularity within this area. No new suspicious pulmonary nodules or masses. Scattered atelectasis/scarring. Musculoskeletal: No aggressive lytic or blastic lesion of bone. CT ABDOMEN PELVIS FINDINGS Hepatobiliary: Similar heterogeneous hypodensity along the falciform ligament versus on image 66/2 commonly reflecting focal fatty infiltration/differential perfusion. Gallbladder is unremarkable. No biliary ductal dilation. Pancreas: No pancreatic ductal dilation or evidence of acute inflammation. Spleen: No splenomegaly. Adrenals/Urinary Tract: Stable 11 mm right adrenal nodule on image 69/2 previously characterized as a benign adenoma on MRI April 19, 2023. No hydronephrosis. Stable 15 mm right renal lesion on image 73/2 previously characterized as a benign angiomyolipoma on MRI abdomen April 19, 2023. Kidneys demonstrate symmetric enhancement. Urinary bladder is unremarkable for degree of distension. Stomach/Bowel: Stomach is unremarkable for degree of distension. No pathologic dilation of large or small bowel. No evidence of acute bowel inflammation. Vascular/Lymphatic: Normal caliber abdominal aorta. Smooth IVC contours. The portal, splenic and superior mesenteric veins are patent. No pathologically enlarged abdominal or pelvic lymph  nodes. Reproductive: Prostate is unremarkable. Other: No significant abdominopelvic free fluid. Musculoskeletal: No aggressive lytic or blastic lesion of bone. Stable bone island in the left iliac bone and left femoral head. IMPRESSION: 1. Similar masslike fibrosis with architectural distortion in the right greater than left paramediastinal bilateral upper and lower lobes, again favored to reflect post radiation change. There is no new suspicious nodularity within this area. 2. No evidence of new or progressive disease in the chest, abdomen or pelvis. 3. Stable 11 mm right adrenal nodule previously characterized as a benign adenoma on MRI April 19, 2023. 4. Stable 15 mm right renal lesion previously characterized as a benign angiomyolipoma on MRI abdomen April 19, 2023. 5. Aortic atherosclerosis. Electronically Signed   By: Tama Fails M.D.   On: 08/15/2023 15:53      ASSESSMENT & PLAN:   This is a 58 year old male with   1. Metastatic Poorly differentiated lung adenocarcinoma Presented with Large neck mass, mediastinal mass, renal mass, and questionable bone lesions  no brain mets on MRI brain 01/22/2019 PD-L1 Immunohistochemistry Analysis which revealed "Tumor Proportion Score (TPS) 50%" 05/16/2019 PET/CT ([id number]) revealed "Radiation changes in the right hemithorax, as above. Improving mediastinal nodal metastases. Prior bulky right cervical metastases have resolved. No evidence of metastatic disease in the abdomen/pelvis." 07/19/2019 Esophagus Scan ([id number]) revealed "Mild stricture at the C6-7 level likely related to prior esophageal surgery. Barium tablet was slow to pass through this area but did pass through after 1 minutes. Hiatal hernia with moderate gastroesophageal reflux. Moderate stricture above the hiatal hernia likely due to reflux. Barium tablet  did not pass this area. There are changes of esophagitis which are likely due to reflux and possibly radiation as well."   10/10/19 of  CT Chest W Contrast ([id number])- no evidence of lung cancer progression at this time. He did have a PET CT scan on 04/13/2021 which was reviewed on the previous visit by Delores Fester.  This showed some borderline FDG avid right paratracheal right hilar and azygoesophageal lymph nodes that were suspicious for possible nodal recurrence however the patient notes that he did have a bad upper respiratory tract infection around the time that he had his PET CT scan and therefore reactive adenopathy is a possibility as well.    2. 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 Symptomatic hemorrhoids-currently no significant bleeding but have caused some mild iron  deficiency anemia.  5. Normocytic anemia -Likely due to underlying malignancy + Iron  deficiency due to ongoing bleeding from hemorrhoids   6. S/pThrombocytosis -- likely due to paraneoplastic effect of tumor and reactive due to inflammation and tissue inflammation from RT.-- now resolved.   7. Nicotine dependence -has quit since cancer diagnosis  8. Iron  deficiency - likely due to blood loss from hemorrhoids  9.  Erectile dysfunction-having partial benefits with using Viagra .  PLAN:   -Discussed lab results on 08/31/23 in detail with patient. CBC stable,  showed WBC of 5.4K, hemoglobin of 11.1, and platelets of 278K. -CMP stable -CT chest abdm pelvis shows stable disease. There are some radiation-related changes in the area of the right lung. There are no new lung noducles. No other new disease in the chest, abdomen, or pelvis. 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  -skin rashes are unlikely to be be related to  immunotherapy given that he has been receiving immunotherapy for several years  -discussed that skin reactions from immunotherapy would generally present within the first 6 months, and especially in the first 3 months -continue Zyrtec  and Pepcid  once a day to control skin rashes  -continue Eliquis  2.5mg  BID -patient has no notable toxicities with immunotherapy at this time  -continue Atezolizumab  immunotherapy -discussed option to continue immuntherapy until it stops working or if he has any significant side effects that would require it to be stopped -discussed second option to stop immunotherapy if he is disease-free for 2-3 years with close monitoring  -educated patient that in genreal, immunotherapy has a memory component like a vaccine which works by training his immune system to fight cancer.  -educated patient that use of high-dose Prednisone  for an extended period can reverse immunotherapy (this is not a concern at this time) -answered all of patient's questions in detail -Continue to follow-up with Rheumatologist.    FOLLOW UP: ***  The total time spent in the appointment was *** minutes* .  All of the patient's questions were answered with apparent satisfaction. The patient knows to call the clinic with any problems, questions or concerns.   Jacquelyn Matt MD MS AAHIVMS [facility] Hematology/Oncology Physician [facility]  .*Total Encounter Time as defined by the Centers for Medicare and Medicaid Services includes, in addition to the face-to-face time of a patient visit (documented in the note above) non-face-to-face time:  obtaining and reviewing outside history, ordering and reviewing medications, tests or procedures, care coordination (communications with other health care professionals or caregivers) and documentation in the medical record.    I,Mitra Faeizi,acting as a Neurosurgeon for Jacquelyn Matt, MD.,have documented all relevant documentation on the behalf of Jacquelyn Matt, MD,as directed by  Jacquelyn Matt, MD while in the presence of Jacquelyn Matt, MD.  ***

## 2023-08-31 NOTE — Progress Notes (Signed)
 Patient seen by Dr. Addison Naegeli are within treatment parameters.  Labs reviewed: and are within treatment parameters.  Per physician team, patient is ready for treatment and there are NO modifications to the treatment plan.

## 2023-09-01 ENCOUNTER — Inpatient Hospital Stay

## 2023-09-01 VITALS — BP 102/68 | HR 65 | Temp 97.6°F | Resp 18

## 2023-09-01 DIAGNOSIS — Z5112 Encounter for antineoplastic immunotherapy: Secondary | ICD-10-CM | POA: Diagnosis not present

## 2023-09-01 DIAGNOSIS — Z7189 Other specified counseling: Secondary | ICD-10-CM

## 2023-09-01 DIAGNOSIS — C3491 Malignant neoplasm of unspecified part of right bronchus or lung: Secondary | ICD-10-CM

## 2023-09-01 LAB — TSH: TSH: 1.49 u[IU]/mL (ref 0.350–4.500)

## 2023-09-01 MED ORDER — FAMOTIDINE 20 MG PO TABS
20.0000 mg | ORAL_TABLET | Freq: Once | ORAL | Status: AC
  Administered 2023-09-01: 20 mg via ORAL
  Filled 2023-09-01: qty 1

## 2023-09-01 MED ORDER — HEPARIN SOD (PORK) LOCK FLUSH 100 UNIT/ML IV SOLN
500.0000 [IU] | Freq: Once | INTRAVENOUS | Status: AC | PRN
Start: 2023-09-01 — End: 2023-09-01
  Administered 2023-09-01: 500 [IU]

## 2023-09-01 MED ORDER — ACETAMINOPHEN 325 MG PO TABS
650.0000 mg | ORAL_TABLET | Freq: Once | ORAL | Status: AC
Start: 2023-09-01 — End: 2023-09-01
  Administered 2023-09-01: 650 mg via ORAL
  Filled 2023-09-01: qty 2

## 2023-09-01 MED ORDER — DIPHENHYDRAMINE HCL 25 MG PO CAPS
25.0000 mg | ORAL_CAPSULE | Freq: Once | ORAL | Status: AC
  Administered 2023-09-01: 25 mg via ORAL
  Filled 2023-09-01: qty 1

## 2023-09-01 MED ORDER — SODIUM CHLORIDE 0.9 % IV SOLN: Freq: Once | INTRAVENOUS | Status: AC

## 2023-09-01 MED ORDER — SODIUM CHLORIDE 0.9% FLUSH
10.0000 mL | INTRAVENOUS | Status: DC | PRN
  Administered 2023-09-01: 10 mL

## 2023-09-01 MED ORDER — SODIUM CHLORIDE 0.9 % IV SOLN
1200.0000 mg | Freq: Once | INTRAVENOUS | Status: AC
  Administered 2023-09-01: 1200 mg via INTRAVENOUS
  Filled 2023-09-01: qty 20

## 2023-09-01 NOTE — Patient Instructions (Signed)
 [facility] - A DEPT OF [facility]  Discharge Instructions: Thank you for choosing [facility] to provide your oncology and hematology care.   If you have a lab appointment with the Cancer Center, please go directly to the Cancer Center and check in at the registration area.   Wear comfortable clothing and clothing appropriate for easy access to any Portacath or PICC line.   We strive to give you quality time with your provider. You may need to reschedule your appointment if you arrive late (15 or more minutes).  Arriving late affects you and other patients whose appointments are after yours.  Also, if you miss three or more appointments without notifying the office, you may be dismissed from the clinic at the provider's discretion.      For prescription refill requests, have your pharmacy contact our office and allow 72 hours for refills to be completed.    Today you received the following chemotherapy and/or immunotherapy agents: Tecentriq      To help prevent nausea and vomiting after your treatment, we encourage you to take your nausea medication as directed.  BELOW ARE SYMPTOMS THAT SHOULD BE REPORTED IMMEDIATELY: *FEVER GREATER THAN 100.4 F (38 C) OR HIGHER *CHILLS OR SWEATING *NAUSEA AND VOMITING THAT IS NOT CONTROLLED WITH YOUR NAUSEA MEDICATION *UNUSUAL SHORTNESS OF BREATH *UNUSUAL BRUISING OR BLEEDING *URINARY PROBLEMS (pain or burning when urinating, or frequent urination) *BOWEL PROBLEMS (unusual diarrhea, constipation, pain near the anus) TENDERNESS IN MOUTH AND THROAT WITH OR WITHOUT PRESENCE OF ULCERS (sore throat, sores in mouth, or a toothache) UNUSUAL RASH, SWELLING OR PAIN  UNUSUAL VAGINAL DISCHARGE OR ITCHING   Items with * indicate a potential emergency and should be followed up as soon as possible or go to the Emergency Department if any problems should occur.  Please show the CHEMOTHERAPY ALERT CARD or IMMUNOTHERAPY  ALERT CARD at check-in to the Emergency Department and triage nurse.  Should you have questions after your visit or need to cancel or reschedule your appointment, please contact [facility]  Dept: [phone]  and follow the prompts.  Office hours are 8:00 a.m. to 4:30 p.m. Monday - Friday. Please note that voicemails left after 4:00 p.m. may not be returned until the following business day.  We are closed weekends and major holidays. You have access to a nurse at all times for urgent questions. Please call the main number to the clinic Dept: [phone] and follow the prompts.   For any non-urgent questions, you may also contact your provider using MyChart. We now offer e-Visits for anyone 62 and older to request care online for non-urgent symptoms. For details visit [email or web address].   Also download the MyChart app! Go to the app store, search "MyChart", open the app, select [place], and log in with your MyChart username and password.

## 2023-09-02 ENCOUNTER — Encounter: Payer: Self-pay | Admitting: Hematology

## 2023-09-06 ENCOUNTER — Encounter: Payer: Self-pay | Admitting: Hematology

## 2023-09-06 ENCOUNTER — Encounter: Payer: Self-pay | Admitting: Physician Assistant

## 2023-09-13 ENCOUNTER — Other Ambulatory Visit: Payer: Self-pay | Admitting: Hematology

## 2023-09-13 DIAGNOSIS — C3491 Malignant neoplasm of unspecified part of right bronchus or lung: Secondary | ICD-10-CM

## 2023-09-13 DIAGNOSIS — Z7189 Other specified counseling: Secondary | ICD-10-CM

## 2023-09-15 ENCOUNTER — Other Ambulatory Visit: Payer: Self-pay

## 2023-09-18 ENCOUNTER — Other Ambulatory Visit: Payer: Self-pay

## 2023-09-20 ENCOUNTER — Other Ambulatory Visit: Payer: Self-pay | Admitting: Physician Assistant

## 2023-09-21 ENCOUNTER — Inpatient Hospital Stay

## 2023-09-21 ENCOUNTER — Inpatient Hospital Stay: Attending: Physician Assistant

## 2023-09-21 VITALS — BP 128/87 | HR 63 | Temp 97.7°F | Resp 16 | Wt 134.5 lb

## 2023-09-21 DIAGNOSIS — C3491 Malignant neoplasm of unspecified part of right bronchus or lung: Secondary | ICD-10-CM | POA: Insufficient documentation

## 2023-09-21 DIAGNOSIS — Z79899 Other long term (current) drug therapy: Secondary | ICD-10-CM | POA: Diagnosis not present

## 2023-09-21 DIAGNOSIS — Z86718 Personal history of other venous thrombosis and embolism: Secondary | ICD-10-CM | POA: Insufficient documentation

## 2023-09-21 DIAGNOSIS — Z7901 Long term (current) use of anticoagulants: Secondary | ICD-10-CM | POA: Diagnosis not present

## 2023-09-21 DIAGNOSIS — Z5112 Encounter for antineoplastic immunotherapy: Secondary | ICD-10-CM | POA: Insufficient documentation

## 2023-09-21 DIAGNOSIS — D649 Anemia, unspecified: Secondary | ICD-10-CM | POA: Diagnosis not present

## 2023-09-21 DIAGNOSIS — C771 Secondary and unspecified malignant neoplasm of intrathoracic lymph nodes: Secondary | ICD-10-CM | POA: Diagnosis present

## 2023-09-21 DIAGNOSIS — Z7189 Other specified counseling: Secondary | ICD-10-CM

## 2023-09-21 DIAGNOSIS — Z95828 Presence of other vascular implants and grafts: Secondary | ICD-10-CM

## 2023-09-21 LAB — CMP (CANCER CENTER ONLY)
ALT: 9 U/L (ref 0–44)
AST: 14 U/L — ABNORMAL LOW (ref 15–41)
Albumin: 4 g/dL (ref 3.5–5.0)
Alkaline Phosphatase: 104 U/L (ref 38–126)
Anion gap: 4 — ABNORMAL LOW (ref 5–15)
BUN: 9 mg/dL (ref 6–20)
CO2: 28 mmol/L (ref 22–32)
Calcium: 9.1 mg/dL (ref 8.9–10.3)
Chloride: 105 mmol/L (ref 98–111)
Creatinine: 0.8 mg/dL (ref 0.61–1.24)
GFR, Estimated: >60 mL/min (ref >60)
Glucose, Bld: 101 mg/dL — ABNORMAL HIGH (ref 70–99)
Potassium: 3.9 mmol/L (ref 3.5–5.1)
Sodium: 137 mmol/L (ref 135–145)
Total Bilirubin: 0.4 mg/dL (ref 0.0–1.2)
Total Protein: 6.9 g/dL (ref 6.5–8.1)

## 2023-09-21 LAB — CBC WITH DIFFERENTIAL (CANCER CENTER ONLY)
Abs Immature Granulocytes: 0.01 10*3/uL (ref 0.00–0.07)
Basophils Absolute: 0 10*3/uL (ref 0.0–0.1)
Basophils Relative: 1 %
Eosinophils Absolute: 0 10*3/uL (ref 0.0–0.5)
Eosinophils Relative: 1 %
HCT: 36 % — ABNORMAL LOW (ref 39.0–52.0)
Hemoglobin: 11.4 g/dL — ABNORMAL LOW (ref 13.0–17.0)
Immature Granulocytes: 0 %
Lymphocytes Relative: 21 %
Lymphs Abs: 0.9 10*3/uL (ref 0.7–4.0)
MCH: 25.7 pg — ABNORMAL LOW (ref 26.0–34.0)
MCHC: 31.7 g/dL (ref 30.0–36.0)
MCV: 81.3 fL (ref 80.0–100.0)
Monocytes Absolute: 0.8 10*3/uL (ref 0.1–1.0)
Monocytes Relative: 17 %
Neutro Abs: 2.6 10*3/uL (ref 1.7–7.7)
Neutrophils Relative %: 60 %
Platelet Count: 331 10*3/uL (ref 150–400)
RBC: 4.43 MIL/uL (ref 4.22–5.81)
RDW: 14.5 % (ref 11.5–15.5)
WBC Count: 4.4 10*3/uL (ref 4.0–10.5)
nRBC: 0 % (ref 0.0–0.2)

## 2023-09-21 LAB — TSH: TSH: 1.5 u[IU]/mL (ref 0.350–4.500)

## 2023-09-21 MED ORDER — DIPHENHYDRAMINE HCL 25 MG PO CAPS
25.0000 mg | ORAL_CAPSULE | Freq: Once | ORAL | Status: AC
  Administered 2023-09-21: 25 mg via ORAL
  Filled 2023-09-21: qty 1

## 2023-09-21 MED ORDER — SODIUM CHLORIDE 0.9% FLUSH
10.0000 mL | INTRAVENOUS | Status: DC | PRN
  Administered 2023-09-21: 10 mL

## 2023-09-21 MED ORDER — HEPARIN SOD (PORK) LOCK FLUSH 100 UNIT/ML IV SOLN
500.0000 [IU] | Freq: Once | INTRAVENOUS | Status: AC | PRN
  Administered 2023-09-21: 500 [IU]

## 2023-09-21 MED ORDER — FAMOTIDINE 20 MG PO TABS
20.0000 mg | ORAL_TABLET | Freq: Once | ORAL | Status: AC
  Administered 2023-09-21: 20 mg via ORAL
  Filled 2023-09-21: qty 1

## 2023-09-21 MED ORDER — SODIUM CHLORIDE 0.9 % IV SOLN
1200.0000 mg | Freq: Once | INTRAVENOUS | Status: AC
  Administered 2023-09-21: 1200 mg via INTRAVENOUS
  Filled 2023-09-21: qty 20

## 2023-09-21 MED ORDER — ACETAMINOPHEN 325 MG PO TABS
650.0000 mg | ORAL_TABLET | Freq: Once | ORAL | Status: AC
  Administered 2023-09-21: 650 mg via ORAL
  Filled 2023-09-21: qty 2

## 2023-09-21 MED ORDER — SODIUM CHLORIDE 0.9 % IV SOLN
Freq: Once | INTRAVENOUS | Status: AC
Start: 2023-09-21 — End: 2023-09-21

## 2023-09-21 NOTE — Patient Instructions (Signed)
 [facility] - A DEPT OF [facility]  Discharge Instructions: Thank you for choosing [facility] to provide your oncology and hematology care.   If you have a lab appointment with the Cancer Center, please go directly to the Cancer Center and check in at the registration area.   Wear comfortable clothing and clothing appropriate for easy access to any Portacath or PICC line.   We strive to give you quality time with your provider. You may need to reschedule your appointment if you arrive late (15 or more minutes).  Arriving late affects you and other patients whose appointments are after yours.  Also, if you miss three or more appointments without notifying the office, you may be dismissed from the clinic at the provider's discretion.      For prescription refill requests, have your pharmacy contact our office and allow 72 hours for refills to be completed.    Today you received the following chemotherapy and/or immunotherapy agents: Tecentriq      To help prevent nausea and vomiting after your treatment, we encourage you to take your nausea medication as directed.  BELOW ARE SYMPTOMS THAT SHOULD BE REPORTED IMMEDIATELY: *FEVER GREATER THAN 100.4 F (38 C) OR HIGHER *CHILLS OR SWEATING *NAUSEA AND VOMITING THAT IS NOT CONTROLLED WITH YOUR NAUSEA MEDICATION *UNUSUAL SHORTNESS OF BREATH *UNUSUAL BRUISING OR BLEEDING *URINARY PROBLEMS (pain or burning when urinating, or frequent urination) *BOWEL PROBLEMS (unusual diarrhea, constipation, pain near the anus) TENDERNESS IN MOUTH AND THROAT WITH OR WITHOUT PRESENCE OF ULCERS (sore throat, sores in mouth, or a toothache) UNUSUAL RASH, SWELLING OR PAIN  UNUSUAL VAGINAL DISCHARGE OR ITCHING   Items with * indicate a potential emergency and should be followed up as soon as possible or go to the Emergency Department if any problems should occur.  Please show the CHEMOTHERAPY ALERT CARD or IMMUNOTHERAPY  ALERT CARD at check-in to the Emergency Department and triage nurse.  Should you have questions after your visit or need to cancel or reschedule your appointment, please contact [facility]  Dept: [phone]  and follow the prompts.  Office hours are 8:00 a.m. to 4:30 p.m. Monday - Friday. Please note that voicemails left after 4:00 p.m. may not be returned until the following business day.  We are closed weekends and major holidays. You have access to a nurse at all times for urgent questions. Please call the main number to the clinic Dept: [phone] and follow the prompts.   For any non-urgent questions, you may also contact your provider using MyChart. We now offer e-Visits for anyone 62 and older to request care online for non-urgent symptoms. For details visit [email or web address].   Also download the MyChart app! Go to the app store, search "MyChart", open the app, select [place], and log in with your MyChart username and password.

## 2023-09-28 ENCOUNTER — Encounter: Payer: Self-pay | Admitting: Hematology

## 2023-09-29 ENCOUNTER — Encounter: Payer: Medicare HMO | Admitting: Internal Medicine

## 2023-10-11 ENCOUNTER — Inpatient Hospital Stay

## 2023-10-11 ENCOUNTER — Inpatient Hospital Stay: Attending: Physician Assistant

## 2023-10-11 ENCOUNTER — Inpatient Hospital Stay (HOSPITAL_BASED_OUTPATIENT_CLINIC_OR_DEPARTMENT_OTHER): Admitting: Physician Assistant

## 2023-10-11 VITALS — BP 106/73 | HR 62 | Temp 98.0°F | Resp 16 | Ht 70.0 in | Wt 131.6 lb

## 2023-10-11 VITALS — BP 92/68 | HR 61 | Resp 16

## 2023-10-11 DIAGNOSIS — Z87891 Personal history of nicotine dependence: Secondary | ICD-10-CM | POA: Insufficient documentation

## 2023-10-11 DIAGNOSIS — Z7189 Other specified counseling: Secondary | ICD-10-CM

## 2023-10-11 DIAGNOSIS — D509 Iron deficiency anemia, unspecified: Secondary | ICD-10-CM | POA: Insufficient documentation

## 2023-10-11 DIAGNOSIS — Z86718 Personal history of other venous thrombosis and embolism: Secondary | ICD-10-CM | POA: Insufficient documentation

## 2023-10-11 DIAGNOSIS — Z7901 Long term (current) use of anticoagulants: Secondary | ICD-10-CM | POA: Diagnosis not present

## 2023-10-11 DIAGNOSIS — C3491 Malignant neoplasm of unspecified part of right bronchus or lung: Secondary | ICD-10-CM

## 2023-10-11 DIAGNOSIS — Z5112 Encounter for antineoplastic immunotherapy: Secondary | ICD-10-CM | POA: Diagnosis present

## 2023-10-11 DIAGNOSIS — Z79899 Other long term (current) drug therapy: Secondary | ICD-10-CM | POA: Insufficient documentation

## 2023-10-11 DIAGNOSIS — Z95828 Presence of other vascular implants and grafts: Secondary | ICD-10-CM

## 2023-10-11 DIAGNOSIS — C771 Secondary and unspecified malignant neoplasm of intrathoracic lymph nodes: Secondary | ICD-10-CM | POA: Insufficient documentation

## 2023-10-11 LAB — CBC WITH DIFFERENTIAL (CANCER CENTER ONLY)
Abs Immature Granulocytes: 0.01 10*3/uL (ref 0.00–0.07)
Basophils Absolute: 0 10*3/uL (ref 0.0–0.1)
Basophils Relative: 1 %
Eosinophils Absolute: 0 10*3/uL (ref 0.0–0.5)
Eosinophils Relative: 1 %
HCT: 36.1 % — ABNORMAL LOW (ref 39.0–52.0)
Hemoglobin: 11.5 g/dL — ABNORMAL LOW (ref 13.0–17.0)
Immature Granulocytes: 0 %
Lymphocytes Relative: 14 %
Lymphs Abs: 0.8 10*3/uL (ref 0.7–4.0)
MCH: 25.6 pg — ABNORMAL LOW (ref 26.0–34.0)
MCHC: 31.9 g/dL (ref 30.0–36.0)
MCV: 80.2 fL (ref 80.0–100.0)
Monocytes Absolute: 0.9 10*3/uL (ref 0.1–1.0)
Monocytes Relative: 16 %
Neutro Abs: 3.9 10*3/uL (ref 1.7–7.7)
Neutrophils Relative %: 68 %
Platelet Count: 320 10*3/uL (ref 150–400)
RBC: 4.5 MIL/uL (ref 4.22–5.81)
RDW: 14.4 % (ref 11.5–15.5)
WBC Count: 5.6 10*3/uL (ref 4.0–10.5)
nRBC: 0 % (ref 0.0–0.2)

## 2023-10-11 LAB — CMP (CANCER CENTER ONLY)
ALT: 10 U/L (ref 0–44)
AST: 14 U/L — ABNORMAL LOW (ref 15–41)
Albumin: 4 g/dL (ref 3.5–5.0)
Alkaline Phosphatase: 110 U/L (ref 38–126)
Anion gap: 5 (ref 5–15)
BUN: 11 mg/dL (ref 6–20)
CO2: 28 mmol/L (ref 22–32)
Calcium: 9.1 mg/dL (ref 8.9–10.3)
Chloride: 106 mmol/L (ref 98–111)
Creatinine: 0.76 mg/dL (ref 0.61–1.24)
GFR, Estimated: >60 mL/min (ref >60)
Glucose, Bld: 105 mg/dL — ABNORMAL HIGH (ref 70–99)
Potassium: 3.8 mmol/L (ref 3.5–5.1)
Sodium: 139 mmol/L (ref 135–145)
Total Bilirubin: 0.4 mg/dL (ref 0.0–1.2)
Total Protein: 7.1 g/dL (ref 6.5–8.1)

## 2023-10-11 LAB — TSH: TSH: 1.2 u[IU]/mL (ref 0.350–4.500)

## 2023-10-11 MED ORDER — FAMOTIDINE 20 MG PO TABS
20.0000 mg | ORAL_TABLET | Freq: Once | ORAL | Status: AC
  Administered 2023-10-11: 20 mg via ORAL
  Filled 2023-10-11: qty 1

## 2023-10-11 MED ORDER — SODIUM CHLORIDE 0.9 % IV SOLN
1200.0000 mg | Freq: Once | INTRAVENOUS | Status: AC
  Administered 2023-10-11: 1200 mg via INTRAVENOUS
  Filled 2023-10-11: qty 20

## 2023-10-11 MED ORDER — ACETAMINOPHEN 325 MG PO TABS
650.0000 mg | ORAL_TABLET | Freq: Once | ORAL | Status: AC
  Administered 2023-10-11: 650 mg via ORAL
  Filled 2023-10-11: qty 2

## 2023-10-11 MED ORDER — SODIUM CHLORIDE 0.9% FLUSH
10.0000 mL | INTRAVENOUS | Status: DC | PRN
  Administered 2023-10-11: 10 mL

## 2023-10-11 MED ORDER — DIPHENHYDRAMINE HCL 25 MG PO CAPS
25.0000 mg | ORAL_CAPSULE | Freq: Once | ORAL | Status: AC
  Administered 2023-10-11: 25 mg via ORAL
  Filled 2023-10-11: qty 1

## 2023-10-11 MED ORDER — SODIUM CHLORIDE 0.9 % IV SOLN: Freq: Once | INTRAVENOUS | Status: AC

## 2023-10-11 MED ORDER — HEPARIN SOD (PORK) LOCK FLUSH 100 UNIT/ML IV SOLN
500.0000 [IU] | Freq: Once | INTRAVENOUS | Status: AC | PRN
  Administered 2023-10-11: 500 [IU]

## 2023-10-11 NOTE — Progress Notes (Signed)
 HEMATOLOGY/ONCOLOGY CLINIC NOTE  Date of Service: 10/11/23  Patient Care Team: Lorella Roles, MD as PCP - General (Family Medicine)   CHIEF COMPLAINTS/PURPOSE OF CONSULTATION:  Follow-up for continued evaluation and management of metastatic lung adenocarcinoma  Current Treatment:  Atezolizumab  maintenance   INTERVAL HISTORY:  David Berry. Is a 58 y.o. male here for continued evaluation and management of his metastatic lung adenocarcinoma. He was last seen by Dr. Salomon Cree on 08/31/2023. He is unaccompanied for this visit.   [name] reports he is tolerating Atezolizumab  therapy without any toxicities. He reports he has noticed more joint pain and stiffness in the back and knees. He is planning to follow up with his rheumatologist for further evaluation. He is otherwise doing well without any changes to his energy or appetite. He denies nausea, vomiting or bowel habit changes. He denies easy bruising or signs of bleeding. He is taking daily zyrtec  which controls his rash. He has occasional episodes of dizziness that lasts only a few seconds and improves once she pauses. He denies any syncopal episodes. 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 He denies fevers, chills, sweats, shortness of breath, chest pain or cough. He has no other complaints.   MEDICAL HISTORY:  Past Medical History:  Diagnosis Date   Anxiety    Bipolar disorder (HCC)    Blood in stool    Cancer (HCC)    Chronic bronchitis with emphysema (HCC)    pt denies this   Eczema    GERD (gastroesophageal reflux disease)    Lung cancer (HCC) dx'd 01/2019   Peripheral vascular disease (HCC)    blood clot in neck Sept 12, 2020    SURGICAL HISTORY: Past Surgical History:  Procedure Laterality Date   APPENDECTOMY     teenager   INGUINAL HERNIA REPAIR Right 2016, 2017   [facility], 2018 [facility]-removed mesh   IR IMAGING GUIDED PORT INSERTION  04/26/2019    TONSILLECTOMY     TOOTH EXTRACTION N/A 03/14/2020   Procedure: DENTAL RESTORATION/EXTRACTIONS;  Surgeon: Ascencion Lava, DDS;  Location: [facility] OR;  Service: Oral Surgery;  Laterality: N/A;    SOCIAL HISTORY: Social History   Socioeconomic History   Marital status: Single    Spouse name: Not on file   Number of children: 1   Years of education: GED   Highest education level: Some college, no degree  Occupational History    Comment: fork lift driver  Tobacco Use   Smoking status: Former    Types: Cigars    Quit date: 01/20/2019    Years since quitting: 4.7    Passive exposure: Current   Smokeless tobacco: Never   Tobacco comments:    smokes 3 black and mild cigars per day  Vaping Use   Vaping status: Never Used  Substance and Sexual Activity   Alcohol use: Not Currently   Drug use: Yes    Types: Marijuana   Sexual activity: Yes  Other Topics Concern   Not on file  Social History Narrative   Lives alone   Caffeine- coffee, 20 oz daily, tea occas   Social Drivers of Health   Financial Resource Strain: Low Risk  (09/29/2020)   Overall Financial Resource Strain (CARDIA)    Difficulty of Paying Living Expenses: Not very hard  Food Insecurity: No Food Insecurity (09/29/2020)   Hunger Vital Sign    Worried About Running Out of Food  in the Last Year: Never true    Ran Out of Food in the Last Year: Never true  Transportation Needs: No Transportation Needs (09/29/2020)   PRAPARE - Administrator, Civil Service (Medical): No    Lack of Transportation (Non-Medical): No  Physical Activity: Sufficiently Active (09/29/2020)   Exercise Vital Sign    Days of Exercise per Week: 3 days    Minutes of Exercise per Session: 150+ min  Stress: No Stress Concern Present (09/29/2020)   [employer] of Occupational Health - Occupational Stress Questionnaire    Feeling of Stress : Not at all  Social Connections: Unknown (03/01/2022)   Received from [facility], [facility]    Social Network    Social Network: Not on file  Intimate Partner Violence: Unknown (03/01/2022)   Received from [facility], [facility]   HITS    Physically Hurt: Not on file    Insult or Talk Down To: Not on file    Threaten Physical Harm: Not on file    Scream or Curse: Not on file    FAMILY HISTORY: Family History  Problem Relation Age of Onset   Prostate cancer Father    Colon cancer Neg Hx    Stomach cancer Neg Hx    Esophageal cancer Neg Hx     ALLERGIES:  is allergic to atorvastatin calcium.  MEDICATIONS:  Current Outpatient Medications  Medication Sig Dispense Refill   cetirizine  (ZYRTEC  ALLERGY ) 10 MG tablet Take 1 tablet (10 mg total) by mouth 2 (two) times daily as needed (hives). 60 tablet 1   Cyanocobalamin (VITAMIN B 12 PO) Take 2,500 mcg by mouth daily.     ELIQUIS  2.5 MG TABS tablet TAKE 1 TABLET(2.5 MG) BY MOUTH TWICE DAILY 60 tablet 5   esomeprazole  (NEXIUM ) 40 MG capsule TAKE 1 CAPSULE(40 MG) BY MOUTH DAILY 90 capsule 2   famotidine  (PEPCID ) 20 MG tablet Take 1 tablet (20 mg total) by mouth 2 (two) times daily. 60 tablet 1   lidocaine -prilocaine  (EMLA ) cream Apply 1 Application topically as needed. 30 g 0   ondansetron  (ZOFRAN ) 4 MG tablet Take 1 tablet (4 mg total) by mouth every 8 (eight) hours as needed for nausea or vomiting. 30 tablet 1   predniSONE  (DELTASONE ) 5 MG tablet Take 1 tablet (5 mg total) by mouth daily as needed. 30 tablet 2   propranolol  (INDERAL ) 20 MG tablet TAKE 1 TABLET(20 MG) BY MOUTH TWICE DAILY 60 tablet 5   sildenafil  (VIAGRA ) 50 MG tablet Take 1 tablet (50 mg total) by mouth daily as needed for erectile dysfunction. Take 30 mins to 4 hours prior to sexual activity 30 tablet 1   No current facility-administered medications for this visit.   Facility-Administered Medications Ordered in Other Visits  Medication Dose Route Frequency Provider Last Rate Last Admin   sodium chloride  flush (NS) 0.9 % injection 10 mL  10 mL  Intracatheter PRN Frankie Israel, MD   10 mL at 05/18/23 1559   sodium chloride  flush (NS) 0.9 % injection 10 mL  10 mL Intracatheter PRN Kale, Gautam Kishore, MD   10 mL at 10/11/23 1042   REVIEW OF SYSTEMS:   10 Point review of Systems was done is negative except as noted above.  PHYSICAL EXAMINATION: ECOG FS:1 - Symptomatic but completely ambulatory  .BP 106/73 (BP Location: Left Arm, Patient Position: Sitting)   Pulse 62   Temp 98 F (36.7 C) (Temporal)   Resp 16  Ht 5\' 10"  (1.778 m)   Wt 131 lb 9.6 oz (59.7 kg)   SpO2 97%   BMI 18.88 kg/m    Wt Readings from Last 3 Encounters:  10/11/23 131 lb 9.6 oz (59.7 kg)  09/21/23 134 lb 8 oz (61 kg)  08/31/23 135 lb 1.6 oz (61.3 kg)   Body mass index is 18.88 kg/m.    GENERAL:alert, in no acute distress and comfortable SKIN: no acute rashes, no significant lesions EYES: conjunctiva are pink and non-injected, sclera anicteric LUNGS: clear to auscultation b/l with normal respiratory effort HEART: regular rate & rhythm Extremity: no pedal edema PSYCH: alert & oriented x 3 with fluent speech NEURO: no focal motor/sensory deficits  LABORATORY DATA:  I have reviewed the data as listed    Latest Ref Rng & Units 10/11/2023    8:05 AM 09/21/2023    8:16 AM 08/31/2023    2:31 PM  CBC  WBC 4.0 - 10.5 K/uL 5.6  4.4  5.4   Hemoglobin 13.0 - 17.0 g/dL 04.5  40.9  81.1   Hematocrit 39.0 - 52.0 % 36.1  36.0  34.2   Platelets 150 - 400 K/uL 320  331  278       Latest Ref Rng & Units 10/11/2023    8:05 AM 09/21/2023    8:16 AM 08/31/2023    2:31 PM  CMP  Glucose 70 - 99 mg/dL 914  782  956   BUN 6 - 20 mg/dL 11  9  8    Creatinine 0.61 - 1.24 mg/dL 2.13  0.86  5.78   Sodium 135 - 145 mmol/L 139  137  138   Potassium 3.5 - 5.1 mmol/L 3.8  3.9  4.1   Chloride 98 - 111 mmol/L 106  105  105   CO2 22 - 32 mmol/L 28  28  29    Calcium 8.9 - 10.3 mg/dL 9.1  9.1  9.5   Total Protein 6.5 - 8.1 g/dL 7.1  6.9  6.9   Total Bilirubin 0.0 -  1.2 mg/dL 0.4  0.4  0.3   Alkaline Phos 38 - 126 U/L 110  104  109   AST 15 - 41 U/L 14  14  16    ALT 0 - 44 U/L 10  9  10     Lab Results  Component Value Date   IRON  16 (L) 05/05/2022   TIBC 448 05/05/2022   FERRITIN 52 10/20/2022    01/22/2019 [facility]: Tumor Mutational Burden     01/22/2019 PD-L1 Immunohistochemistry Analysis    01/22/2019 Soft Tissue Needle Core Biopsy Surgical Pathology     RADIOGRAPHIC STUDIES: I have personally reviewed the radiological images as listed and agreed with the findings in the report. No results found.   ASSESSMENT & PLAN:  David Berry. Is a 58 y.o. male who returns for a follow up for metastatic lung cancer.    1. Metastatic Poorly differentiated lung adenocarcinoma -Presented with Large neck mass, mediastinal mass, renal mass, and questionable bone lesions -No brain mets on MRI brain -01/22/2019 PD-L1 Immunohistochemistry Analysis which revealed "Tumor Proportion Score (TPS) 50%" -Started Atezolizumab  on 03/21/2019. Added Carbo/taxol  with Cycle 2 on 04/10/2019. After 6 cycles, transitioned to maintenance Atezolizumab  q 21 days.  -Most recent CT CAP from 08/15/2023 showed radiation related treatment with imilar masslike fibrosis with architectural distortion in the right greater than left paramediastinal bilateral upper and lower lobes. No evidence of new or progressive disease.  2. h/o  Impending SVC syndrome: --s/p pallaitive RT  3.h/o Acute DVT- Right IJ, Right Innominate Vein, and likely also the SVC --Related to malignancy and tobacco use --On long term Eliquis  but decreased to 2.5 mg BID due to report of BRB with wiping after BM secondary to hemorrhoids.  4. Symptomatic hemorrhoids --Currently no significant bleeding but have caused some mild iron  deficiency anemia.  5. Normocytic anemia--stable -Likely due to underlying malignancy + Iron  deficiency due to ongoing bleeding from hemorrhoids    6.Thrombocytosis--resolved --Likely due to paraneoplastic effect of tumor and reactive due to inflammation and tissue inflammation from RT.  7. Iron  deficiency  -Likely due to blood loss from hemorrhoids -Last received IV venofer  300 mg x 2 doses from 09/16/2022-09/29/2022.  PLAN:  --Due to Atezolizumab  treatment today.  --Labs from today were reviewed and adequate for treatment. WBC 5.6, Hgb 11.5, Plt 320, Creatinine and LFTs  --No other prohibitive toxicities identified so proceed with atezolizumab  without any dose modifications --Continue with daily antihistamine to help with minimize rash --Patient will follow up with rheumatologist regarding joint pain and stiffness.   FOLLOW UP: Continue atezolizumab  for integrated scheduling   All of the patient's questions were answered with apparent satisfaction. The patient knows to call the clinic with any problems, questions or concerns.  I have spent a total of 30 minutes minutes of face-to-face and non-face-to-face time, preparing to see the patient, performing a medically appropriate examination, counseling and educating the patient, ordering tests/procedures, referring and communicating with other health care professionals, documenting clinical information in the electronic health record, and care coordination.   Wyline Hearing PA-C Dept of Hematology and Oncology [facility] Phone: [phone]

## 2023-10-11 NOTE — Patient Instructions (Signed)
 [facility] - A DEPT OF [facility]  Discharge Instructions: Thank you for choosing [facility] to provide your oncology and hematology care.   If you have a lab appointment with the Cancer Center, please go directly to the Cancer Center and check in at the registration area.   Wear comfortable clothing and clothing appropriate for easy access to any Portacath or PICC line.   We strive to give you quality time with your provider. You may need to reschedule your appointment if you arrive late (15 or more minutes).  Arriving late affects you and other patients whose appointments are after yours.  Also, if you miss three or more appointments without notifying the office, you may be dismissed from the clinic at the provider's discretion.      For prescription refill requests, have your pharmacy contact our office and allow 72 hours for refills to be completed.    Today you received the following chemotherapy and/or immunotherapy agents: Tecentriq      To help prevent nausea and vomiting after your treatment, we encourage you to take your nausea medication as directed.  BELOW ARE SYMPTOMS THAT SHOULD BE REPORTED IMMEDIATELY: *FEVER GREATER THAN 100.4 F (38 C) OR HIGHER *CHILLS OR SWEATING *NAUSEA AND VOMITING THAT IS NOT CONTROLLED WITH YOUR NAUSEA MEDICATION *UNUSUAL SHORTNESS OF BREATH *UNUSUAL BRUISING OR BLEEDING *URINARY PROBLEMS (pain or burning when urinating, or frequent urination) *BOWEL PROBLEMS (unusual diarrhea, constipation, pain near the anus) TENDERNESS IN MOUTH AND THROAT WITH OR WITHOUT PRESENCE OF ULCERS (sore throat, sores in mouth, or a toothache) UNUSUAL RASH, SWELLING OR PAIN  UNUSUAL VAGINAL DISCHARGE OR ITCHING   Items with * indicate a potential emergency and should be followed up as soon as possible or go to the Emergency Department if any problems should occur.  Please show the CHEMOTHERAPY ALERT CARD or IMMUNOTHERAPY  ALERT CARD at check-in to the Emergency Department and triage nurse.  Should you have questions after your visit or need to cancel or reschedule your appointment, please contact [facility]  Dept: [phone]  and follow the prompts.  Office hours are 8:00 a.m. to 4:30 p.m. Monday - Friday. Please note that voicemails left after 4:00 p.m. may not be returned until the following business day.  We are closed weekends and major holidays. You have access to a nurse at all times for urgent questions. Please call the main number to the clinic Dept: [phone] and follow the prompts.   For any non-urgent questions, you may also contact your provider using MyChart. We now offer e-Visits for anyone 62 and older to request care online for non-urgent symptoms. For details visit [email or web address].   Also download the MyChart app! Go to the app store, search "MyChart", open the app, select [place], and log in with your MyChart username and password.

## 2023-10-13 ENCOUNTER — Other Ambulatory Visit: Payer: Self-pay

## 2023-10-15 ENCOUNTER — Other Ambulatory Visit: Payer: Self-pay | Admitting: Internal Medicine

## 2023-10-15 DIAGNOSIS — L509 Urticaria, unspecified: Secondary | ICD-10-CM

## 2023-10-17 NOTE — Telephone Encounter (Signed)
 Last Fill: 06/21/2023  Next Visit: 11/18/2023  Last Visit: 08/12/2023  Dx: not mentioned  Current Dose per office note on 08/12/2023: not mentioned  Okay to refill Famotidine ?

## 2023-10-18 ENCOUNTER — Other Ambulatory Visit: Payer: Self-pay

## 2023-10-18 ENCOUNTER — Other Ambulatory Visit: Payer: Self-pay | Admitting: Internal Medicine

## 2023-10-18 DIAGNOSIS — L509 Urticaria, unspecified: Secondary | ICD-10-CM

## 2023-10-18 NOTE — Telephone Encounter (Signed)
 Last Fill: 06/21/2023  Next Visit: 11/18/2023  Last Visit: 08/12/2023  Dx: Urticaria   Current Dose per office note on 06/21/2023: cetirizine  (ZYRTEC  ALLERGY ) 10 MG tablet   Okay to refill Cetirizine  ?

## 2023-10-19 MED ORDER — CETIRIZINE HCL 10 MG PO TABS
10.0000 mg | ORAL_TABLET | Freq: Two times a day (BID) | ORAL | 1 refills | Status: DC | PRN
Start: 2023-10-19 — End: 2024-02-22

## 2023-10-28 ENCOUNTER — Other Ambulatory Visit: Payer: Self-pay

## 2023-11-02 ENCOUNTER — Inpatient Hospital Stay

## 2023-11-02 VITALS — BP 106/76 | HR 65 | Temp 98.2°F | Resp 16 | Wt 131.5 lb

## 2023-11-02 DIAGNOSIS — Z95828 Presence of other vascular implants and grafts: Secondary | ICD-10-CM

## 2023-11-02 DIAGNOSIS — C3491 Malignant neoplasm of unspecified part of right bronchus or lung: Secondary | ICD-10-CM

## 2023-11-02 DIAGNOSIS — Z7189 Other specified counseling: Secondary | ICD-10-CM

## 2023-11-02 DIAGNOSIS — Z5112 Encounter for antineoplastic immunotherapy: Secondary | ICD-10-CM | POA: Diagnosis not present

## 2023-11-02 LAB — CBC WITH DIFFERENTIAL (CANCER CENTER ONLY)
Abs Immature Granulocytes: 0.01 10*3/uL (ref 0.00–0.07)
Basophils Absolute: 0 10*3/uL (ref 0.0–0.1)
Basophils Relative: 1 %
Eosinophils Absolute: 0.1 10*3/uL (ref 0.0–0.5)
Eosinophils Relative: 1 %
HCT: 34.5 % — ABNORMAL LOW (ref 39.0–52.0)
Hemoglobin: 10.9 g/dL — ABNORMAL LOW (ref 13.0–17.0)
Immature Granulocytes: 0 %
Lymphocytes Relative: 21 %
Lymphs Abs: 0.9 10*3/uL (ref 0.7–4.0)
MCH: 25.3 pg — ABNORMAL LOW (ref 26.0–34.0)
MCHC: 31.6 g/dL (ref 30.0–36.0)
MCV: 80.2 fL (ref 80.0–100.0)
Monocytes Absolute: 0.7 10*3/uL (ref 0.1–1.0)
Monocytes Relative: 15 %
Neutro Abs: 2.8 10*3/uL (ref 1.7–7.7)
Neutrophils Relative %: 62 %
Platelet Count: 318 10*3/uL (ref 150–400)
RBC: 4.3 MIL/uL (ref 4.22–5.81)
RDW: 14.6 % (ref 11.5–15.5)
WBC Count: 4.6 10*3/uL (ref 4.0–10.5)
nRBC: 0 % (ref 0.0–0.2)

## 2023-11-02 LAB — CMP (CANCER CENTER ONLY)
ALT: 10 U/L (ref 0–44)
AST: 15 U/L (ref 15–41)
Albumin: 4 g/dL (ref 3.5–5.0)
Alkaline Phosphatase: 118 U/L (ref 38–126)
Anion gap: 6 (ref 5–15)
BUN: 9 mg/dL (ref 6–20)
CO2: 27 mmol/L (ref 22–32)
Calcium: 9 mg/dL (ref 8.9–10.3)
Chloride: 105 mmol/L (ref 98–111)
Creatinine: 0.82 mg/dL (ref 0.61–1.24)
GFR, Estimated: >60 mL/min (ref >60)
Glucose, Bld: 97 mg/dL (ref 70–99)
Potassium: 3.9 mmol/L (ref 3.5–5.1)
Sodium: 138 mmol/L (ref 135–145)
Total Bilirubin: 0.2 mg/dL (ref 0.0–1.2)
Total Protein: 7 g/dL (ref 6.5–8.1)

## 2023-11-02 LAB — TSH: TSH: 1.23 u[IU]/mL (ref 0.350–4.500)

## 2023-11-02 MED ORDER — ACETAMINOPHEN 325 MG PO TABS
650.0000 mg | ORAL_TABLET | Freq: Once | ORAL | Status: AC
  Administered 2023-11-02: 650 mg via ORAL
  Filled 2023-11-02: qty 2

## 2023-11-02 MED ORDER — FAMOTIDINE 20 MG PO TABS
20.0000 mg | ORAL_TABLET | Freq: Once | ORAL | Status: AC
  Administered 2023-11-02: 20 mg via ORAL
  Filled 2023-11-02: qty 1

## 2023-11-02 MED ORDER — SODIUM CHLORIDE 0.9 % IV SOLN
1200.0000 mg | Freq: Once | INTRAVENOUS | Status: AC
  Administered 2023-11-02: 1200 mg via INTRAVENOUS
  Filled 2023-11-02: qty 20

## 2023-11-02 MED ORDER — SODIUM CHLORIDE 0.9% FLUSH
10.0000 mL | INTRAVENOUS | Status: DC | PRN
Start: 2023-11-02 — End: 2023-11-02
  Administered 2023-11-02: 10 mL

## 2023-11-02 MED ORDER — SODIUM CHLORIDE 0.9 % IV SOLN: Freq: Once | INTRAVENOUS | Status: AC

## 2023-11-02 MED ORDER — DIPHENHYDRAMINE HCL 25 MG PO CAPS
25.0000 mg | ORAL_CAPSULE | Freq: Once | ORAL | Status: AC
  Administered 2023-11-02: 25 mg via ORAL
  Filled 2023-11-02: qty 1

## 2023-11-02 NOTE — Patient Instructions (Signed)
 [facility] - A DEPT OF [facility]  Discharge Instructions: Thank you for choosing [facility] to provide your oncology and hematology care.   If you have a lab appointment with the Cancer Center, please go directly to the Cancer Center and check in at the registration area.   Wear comfortable clothing and clothing appropriate for easy access to any Portacath or PICC line.   We strive to give you quality time with your provider. You may need to reschedule your appointment if you arrive late (15 or more minutes).  Arriving late affects you and other patients whose appointments are after yours.  Also, if you miss three or more appointments without notifying the office, you may be dismissed from the clinic at the provider's discretion.      For prescription refill requests, have your pharmacy contact our office and allow 72 hours for refills to be completed.    Today you received the following chemotherapy and/or immunotherapy agents: Tecentriq      To help prevent nausea and vomiting after your treatment, we encourage you to take your nausea medication as directed.  BELOW ARE SYMPTOMS THAT SHOULD BE REPORTED IMMEDIATELY: *FEVER GREATER THAN 100.4 F (38 C) OR HIGHER *CHILLS OR SWEATING *NAUSEA AND VOMITING THAT IS NOT CONTROLLED WITH YOUR NAUSEA MEDICATION *UNUSUAL SHORTNESS OF BREATH *UNUSUAL BRUISING OR BLEEDING *URINARY PROBLEMS (pain or burning when urinating, or frequent urination) *BOWEL PROBLEMS (unusual diarrhea, constipation, pain near the anus) TENDERNESS IN MOUTH AND THROAT WITH OR WITHOUT PRESENCE OF ULCERS (sore throat, sores in mouth, or a toothache) UNUSUAL RASH, SWELLING OR PAIN  UNUSUAL VAGINAL DISCHARGE OR ITCHING   Items with * indicate a potential emergency and should be followed up as soon as possible or go to the Emergency Department if any problems should occur.  Please show the CHEMOTHERAPY ALERT CARD or IMMUNOTHERAPY  ALERT CARD at check-in to the Emergency Department and triage nurse.  Should you have questions after your visit or need to cancel or reschedule your appointment, please contact [facility]  Dept: [phone]  and follow the prompts.  Office hours are 8:00 a.m. to 4:30 p.m. Monday - Friday. Please note that voicemails left after 4:00 p.m. may not be returned until the following business day.  We are closed weekends and major holidays. You have access to a nurse at all times for urgent questions. Please call the main number to the clinic Dept: [phone] and follow the prompts.   For any non-urgent questions, you may also contact your provider using MyChart. We now offer e-Visits for anyone 62 and older to request care online for non-urgent symptoms. For details visit [email or web address].   Also download the MyChart app! Go to the app store, search "MyChart", open the app, select [place], and log in with your MyChart username and password.

## 2023-11-07 NOTE — Progress Notes (Signed)
 Office Visit Note  Patient: David Berry.             Date of Birth: 12/28/1965           MRN: [id number]             PCP: Haze Kingfisher, MD Referring: Haze Kingfisher, MD Visit Date: 11/18/2023   Subjective:  Follow-up (Oncologist recommended patient to limit the use of the prednisone  due to some other medical issues going on.  )   Discussed the use of AI scribe software for clinical note transcription with the patient, who gave verbal consent to proceed.  History of Present Illness   David Berry. is a 58 y.o. 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  He is currently undergoing immunotherapy with Tecentriq  for lung cancer. Previously, he was on prednisone , which effectively managed his joint pain, but its use has been reduced due to potential interactions with his cancer treatment. He experiences significant discomfort, noting that he was awake from 1 AM due to pain.  He experiences muscle pain primarily in his shoulders and neck, describing it as similar to post-exercise soreness. The pain worsens with movement, especially when raising his arms overhead, and has been problematic for the past few days. The pain is diffuse across the shoulders and neck.  He is concerned about weight loss, observing 'skinny thighs' and feeling he is losing weight, questioning if he needs more protein. No swelling or visible changes in the affected areas.  He describes his back as 'crooked' and has a history of degenerative disc disease, although he does not believe this is contributing to his current shoulder and neck pain.       Previous HPI 08/12/2023 David Berry. 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   He experiences joint pain, particularly in his fingers, with a recent episode where left middle  finger became significantly swollen and somewhat deviated to the side. He also has aching in his back and hips, though currently feels well. He has a history of degenerative disc disease with mild wear and tear in the lower back, and some bone spurring. He reports no significant issues with his hip joints.   His skin condition has improved with the use of double antihistamines, specifically Zyrtec  and Pepcid , taken once daily. He notes no new breakouts and states that the treatment is effective.   Labs reviewed 06/21/23 ESR 34 CRP 11.2 RF neg UPCR wnl dsDA 264 C3/C4 wnl   Previous HPI 06/21/23 David Berry. is a 58 year old male here for evaluation of rashes, joint pain, and abnormal blood test results with positive dsDNA. He was referred by Dr. Orlan who saw him on account of urticarial rashes, which did improve on subsequent treatment with high dose zyrtec  and pepcid .   He has been on Tecentriq  (PD-L1) for lung cancer since 2020, with no changes to this treatment in the last year.   Rashes have been present since January of last year, initially associated with starting a statin medication. The rashes appear as hives, affecting his sides, back, legs, and shoulders, and are described as itchy but not painful. They typically last overnight, leaving redness the next morning. The rashes subsided while on Zyrtec  and Pepcid , but recurred after discontinuation of these medications.   Joint pain and swelling  have been present since last year, coinciding with the onset of the rashes. He describes a tingling sensation in his fingers and swelling in his hands. No prior history of similar symptoms before last year. He has not been on any steroids recently, although he used them a few years ago for back problems.   He has experienced new onset headaches over the past few months, described as short and progressive.   He has a history of falling on his right side at the end of the year before last,  which he associates with current hip pain. He has not had any x-rays for this issue. He describes the pain as arthritic, particularly when moving or getting up, and notes it is worse since stopping Zyrtec  and Pepcid .   Labs reviewed 04/2023 dsDNA 243 RNP, Sm, SSA, SSB neg ESR 43   Review of Systems  Constitutional:  Negative for fatigue.  HENT:  Negative for mouth sores and mouth dryness.   Eyes:  Negative for dryness.  Respiratory:  Negative for shortness of breath.   Cardiovascular:  Negative for chest pain and palpitations.  Gastrointestinal:  Negative for blood in stool, constipation and diarrhea.  Endocrine: Negative for increased urination.  Genitourinary:  Negative for involuntary urination.  Musculoskeletal:  Positive for myalgias, muscle weakness, morning stiffness, muscle tenderness and myalgias. Negative for joint pain, gait problem, joint pain and joint swelling.  Skin:  Positive for sensitivity to sunlight. Negative for color change, rash and hair loss.  Allergic/Immunologic: Negative for susceptible to infections.  Neurological:  Positive for dizziness. Negative for headaches.  Hematological:  Negative for swollen glands.  Psychiatric/Behavioral:  Negative for depressed mood and sleep disturbance. The patient is not nervous/anxious.     PMFS History:  Patient Active Problem List   Diagnosis Date Noted   Inflammatory arthritis 11/18/2023   High risk medication use 11/18/2023   Positive ANA (antinuclear antibody) 06/21/2023   Pain in right hip 06/21/2023   Intention tremor 04/21/2021   Mononeuropathy 04/21/2021   Chronic anticoagulation 02/28/2021   Right shoulder pain 02/28/2021   Sternoclavicular joint pain, right 02/28/2021   Encounter for immunotherapy 10/29/2020   Iron  deficiency anemia 10/29/2020   Gastroesophageal reflux disease 10/02/2020   Hypotension due to hypovolemia 10/02/2020   Port-A-Cath in place 06/13/2019   Carpal tunnel syndrome of right wrist  02/26/2019   Former smoker 02/26/2019   Adenocarcinoma of right lung (HCC) 02/05/2019   Counseling regarding advance care planning and goals of care 02/05/2019   Acute deep vein thrombosis (DVT) of non-extremity vein    Adenocarcinoma of lung (HCC)    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   Internal jugular (IJ) vein thromboembolism, acute, right (HCC) 01/24/2019   Mass of right side of neck, s/p Radiation therapy 01/24/2019   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   Right renal mass 01/24/2019   Hip mass, right 01/24/2019   Thyromegaly 01/24/2019   Extravasation injury of IV catheter site with other complication (HCC)    BACK PAIN, LUMBAR 03/24/2010   LUMBAR RADICULOPATHY 03/24/2010    Past Medical History:  Diagnosis Date   Anxiety    Bipolar disorder (HCC)    Blood in stool    Cancer (HCC)    Chronic bronchitis with emphysema (HCC)    pt denies this   Eczema    GERD (gastroesophageal reflux disease)    Lung cancer (HCC) dx'd 01/2019   Peripheral vascular disease (HCC)    blood clot in neck Sept 12, 2020  Family History  Problem Relation Age of Onset   Prostate cancer Father    Colon cancer Neg Hx    Stomach cancer Neg Hx    Esophageal cancer Neg Hx    Past Surgical History:  Procedure Laterality Date   APPENDECTOMY     teenager   INGUINAL HERNIA REPAIR Right 2016, 2017   [facility], 2018 [facility]-removed mesh   IR IMAGING GUIDED PORT INSERTION  04/26/2019   TONSILLECTOMY     TOOTH EXTRACTION N/A 03/14/2020   Procedure: DENTAL RESTORATION/EXTRACTIONS;  Surgeon: Sheryle Hamilton, DDS;  Location: [facility] OR;  Service: Oral Surgery;  Laterality: N/A;   Social History   Social History Narrative   Lives alone   Caffeine- coffee, 20 oz daily, tea occas    There is no immunization history on file for this patient.   Objective: Vital Signs: BP 99/62 (BP Location: Left Arm, Patient Position: Sitting, Cuff Size: Normal)   Pulse 76   Resp 16   Ht 5' 10 (1.778 m)   Wt 130 lb 9.6 oz (59.2  kg)   BMI 18.74 kg/m    Physical Exam Eyes:     Conjunctiva/sclera: Conjunctivae normal.  Cardiovascular:     Rate and Rhythm: Normal rate and regular rhythm.  Pulmonary:     Effort: Pulmonary effort is normal.     Breath sounds: Normal breath sounds.  Musculoskeletal:     Right lower leg: No edema.     Left lower leg: No edema.  Skin:    General: Skin is warm and dry.     Findings: No rash.  Neurological:     Mental Status: He is alert.  Psychiatric:        Mood and Affect: Mood normal.      Musculoskeletal Exam:  Shoulders full ROM with no swelling, pain is more severe during active abduction and external rotation and less severe during passive movement Wrists full ROM no tenderness or swelling Fingers full ROM no tenderness or swelling Low back midline and paraspinal muscle tenderness to pressure Some pain and stiffness with hip flexion especially resisted movement, normal strength and no pain with knee flexion and extension Knees full ROM no tenderness or swelling   Investigation: No additional findings.  Imaging: CT CHEST ABDOMEN PELVIS W CONTRAST Result Date: 11/23/2023 CLINICAL DATA:  Low back pain, shoulder pain, evaluate for progression of lung cancer * Tracking Code: BO * EXAM: CT CHEST, ABDOMEN, AND PELVIS WITH CONTRAST TECHNIQUE: Multidetector CT imaging of the chest, abdomen and pelvis was performed following the standard protocol during bolus administration of intravenous contrast. RADIATION DOSE REDUCTION: This exam was performed according to the departmental dose-optimization program which includes automated exposure control, adjustment of the mA and/or kV according to patient size and/or use of iterative reconstruction technique. CONTRAST:  OMNIPAQUE  IOHEXOL  300 MG/ML  SOLN COMPARISON:  08/15/2023 FINDINGS: CT CHEST FINDINGS Cardiovascular: Left chest port catheter. Normal heart size. No pericardial effusion. Mediastinum/Nodes: Unchanged treated  pretracheal and right soft tissue. No discretely enlarged lymph nodes (series 2, image 25). Thyroid  gland, trachea, and esophagus demonstrate no significant findings. Lungs/Pleura: Unchanged post treatment appearance of the chest with perihilar fibrosis and consolidation of the right upper lobe with additional paramedian fibrosis in the left upper lobe (series 4, image 63). Mild underlying paraseptal and centrilobular emphysema. No pleural effusion or pneumothorax. Musculoskeletal: No chest wall abnormality. No acute osseous findings. CT ABDOMEN PELVIS FINDINGS Hepatobiliary: No solid liver abnormality is seen. No gallstones, gallbladder  wall thickening, or biliary dilatation. Pancreas: Unremarkable. No pancreatic ductal dilatation or surrounding inflammatory changes. Spleen: Normal in size without significant abnormality. Adrenals/Urinary Tract: Unchanged benign right adrenal adenoma, previously characterized by MR (series 2, image 66). 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 Kidneys are normal, without renal calculi, solid lesion, or hydronephrosis. Bladder is unremarkable. Stomach/Bowel: Stomach is within normal limits. Appendix not clearly visualized. No evidence of bowel wall thickening, distention, or inflammatory changes. Vascular/Lymphatic: Scattered aortic atherosclerosis. No enlarged abdominal or pelvic lymph nodes. Reproductive: Prostatomegaly. Other: No abdominal wall hernia or abnormality. No ascites. Musculoskeletal: No acute osseous findings. IMPRESSION: 1. No acute CT findings to explain back or shoulder pain. 2. Unchanged post treatment appearance of the chest with perihilar fibrosis and consolidation of the right upper lobe with additional paramedian fibrosis in the left upper lobe. 3. Unchanged treated pretracheal and right soft tissue. No discretely enlarged lymph nodes. 4. No evidence of  lymphadenopathy or metastatic disease in the abdomen or pelvis. Aortic Atherosclerosis (ICD10-I70.0) and Emphysema (ICD10-J43.9). Electronically Signed   By: Marolyn JONETTA Jaksch M.D.   On: 11/23/2023 21:05    Recent Labs: Lab Results  Component Value Date   WBC 7.4 11/23/2023   HGB 11.4 (L) 11/23/2023   PLT 449 (H) 11/23/2023   NA 137 11/23/2023   K 4.4 11/23/2023   CL 103 11/23/2023   CO2 29 11/23/2023   GLUCOSE 100 (H) 11/23/2023   BUN 10 11/23/2023   CREATININE 0.73 11/23/2023   BILITOT 0.5 11/23/2023   ALKPHOS 114 11/23/2023   AST 11 (L) 11/23/2023   ALT 8 11/23/2023   PROT 7.4 11/23/2023   ALBUMIN 3.9 11/23/2023   CALCIUM 9.7 11/23/2023   GFRAA >60 01/30/2020    Speciality Comments: No specialty comments available.  Procedures:  No procedures performed Allergies: Atorvastatin calcium   Assessment / Plan:     Visit Diagnoses: Positive ANA (antinuclear antibody) - Plan: Sedimentation rate, C-reactive protein, Anti-DNA antibody, double-stranded Joint pain due to immunotherapy Inflammatory arthritis.  To be new development with treatment on atezolizumab .  He reports being directed not to take prednisone  to prevent interference with cancer treatment.  I am not sure there is a better alternative that would be as effective for inflammation without some potential for immunosuppressive effect though a more narrow agent could be an option. - Will check with his oncology provider about thoughts and risk of low-dose corticosteroids compared to methotrexate or TNF inhibitor. - Rechecking dsDNA, sed rate, CRP for inflammatory disease activity monitoring Adenocarcinoma of right lung (HCC)  Lung cancer treated with Tecentriq . Prednisone  avoided to prevent interference with immunotherapy. Cancer treatment prioritized due to severity.  High risk medication use - Plan: Hepatitis B core antibody, IgM, Hepatitis B surface antigen, Hepatitis C antibody Is somewhat risks of alternate medications  including methotrexate and TNF inhibitors.  Specifically hepatotoxicity, cytopenias, infections, GI intolerance, or local injection reactions.  Has recent baseline blood count and kidney and liver function test.  Will also update appetizer B and C screening which I do not see any currently on file.  Muscle pain in shoulder and neck Muscle pain likely due to recent physical activity, affecting muscles and tendons.   Orders: Orders Placed This Encounter  Procedures   Sedimentation rate   C-reactive protein   Hepatitis B core antibody, IgM   Hepatitis B surface antigen   Hepatitis C antibody   Anti-DNA antibody, double-stranded   No  orders of the defined types were placed in this encounter.    Follow-Up Instructions: Return in about 3 months (around 02/18/2024) for irae? f/u 3mos.   Lonni LELON Ester, MD  Note - This record has been created using [employer].  Chart creation errors have been sought, but may not always  have been located. Such creation errors do not reflect on  the standard of medical care.

## 2023-11-18 ENCOUNTER — Ambulatory Visit: Attending: Internal Medicine | Admitting: Internal Medicine

## 2023-11-18 ENCOUNTER — Encounter: Payer: Self-pay | Admitting: Internal Medicine

## 2023-11-18 VITALS — BP 99/62 | HR 76 | Resp 16 | Ht 70.0 in | Wt 130.6 lb

## 2023-11-18 DIAGNOSIS — R768 Other specified abnormal immunological findings in serum: Secondary | ICD-10-CM | POA: Diagnosis not present

## 2023-11-18 DIAGNOSIS — Z79899 Other long term (current) drug therapy: Secondary | ICD-10-CM

## 2023-11-18 DIAGNOSIS — C3491 Malignant neoplasm of unspecified part of right bronchus or lung: Secondary | ICD-10-CM | POA: Diagnosis not present

## 2023-11-18 DIAGNOSIS — M1990 Unspecified osteoarthritis, unspecified site: Secondary | ICD-10-CM

## 2023-11-18 DIAGNOSIS — M7989 Other specified soft tissue disorders: Secondary | ICD-10-CM

## 2023-11-18 DIAGNOSIS — Z7952 Long term (current) use of systemic steroids: Secondary | ICD-10-CM | POA: Diagnosis not present

## 2023-11-19 ENCOUNTER — Other Ambulatory Visit: Payer: Self-pay

## 2023-11-19 LAB — HEPATITIS B CORE ANTIBODY, IGM: Hep B C IgM: NONREACTIVE

## 2023-11-19 LAB — SEDIMENTATION RATE: Sed Rate: 38 mm/h — ABNORMAL HIGH (ref 0–20)

## 2023-11-19 LAB — C-REACTIVE PROTEIN: CRP: 22.6 mg/L — ABNORMAL HIGH (ref <8.0)

## 2023-11-19 LAB — ANTI-DNA ANTIBODY, DOUBLE-STRANDED: ds DNA Ab: 211 [IU]/mL — ABNORMAL HIGH

## 2023-11-19 LAB — HEPATITIS B SURFACE ANTIGEN: Hepatitis B Surface Ag: NONREACTIVE

## 2023-11-19 LAB — HEPATITIS C ANTIBODY: Hepatitis C Ab: NONREACTIVE

## 2023-11-23 ENCOUNTER — Inpatient Hospital Stay: Attending: Physician Assistant

## 2023-11-23 ENCOUNTER — Ambulatory Visit (HOSPITAL_COMMUNITY)
Admission: RE | Admit: 2023-11-23 | Discharge: 2023-11-23 | Disposition: A | Discharge status: Home / Self Care | Source: Ambulatory Visit | Attending: Physician Assistant | Admitting: Physician Assistant

## 2023-11-23 ENCOUNTER — Inpatient Hospital Stay

## 2023-11-23 ENCOUNTER — Inpatient Hospital Stay: Attending: Physician Assistant | Admitting: Physician Assistant

## 2023-11-23 VITALS — BP 127/103 | HR 81 | Temp 98.4°F | Resp 18 | Ht 70.0 in | Wt 128.6 lb

## 2023-11-23 DIAGNOSIS — Z7901 Long term (current) use of anticoagulants: Secondary | ICD-10-CM | POA: Diagnosis not present

## 2023-11-23 DIAGNOSIS — Z86718 Personal history of other venous thrombosis and embolism: Secondary | ICD-10-CM | POA: Diagnosis not present

## 2023-11-23 DIAGNOSIS — D509 Iron deficiency anemia, unspecified: Secondary | ICD-10-CM | POA: Insufficient documentation

## 2023-11-23 DIAGNOSIS — C3491 Malignant neoplasm of unspecified part of right bronchus or lung: Secondary | ICD-10-CM | POA: Insufficient documentation

## 2023-11-23 DIAGNOSIS — Z7189 Other specified counseling: Secondary | ICD-10-CM | POA: Diagnosis not present

## 2023-11-23 DIAGNOSIS — Z87891 Personal history of nicotine dependence: Secondary | ICD-10-CM | POA: Diagnosis not present

## 2023-11-23 DIAGNOSIS — C771 Secondary and unspecified malignant neoplasm of intrathoracic lymph nodes: Secondary | ICD-10-CM | POA: Diagnosis present

## 2023-11-23 DIAGNOSIS — Z5112 Encounter for antineoplastic immunotherapy: Secondary | ICD-10-CM | POA: Diagnosis not present

## 2023-11-23 DIAGNOSIS — Z95828 Presence of other vascular implants and grafts: Secondary | ICD-10-CM

## 2023-11-23 DIAGNOSIS — Z79899 Other long term (current) drug therapy: Secondary | ICD-10-CM | POA: Insufficient documentation

## 2023-11-23 LAB — CBC WITH DIFFERENTIAL (CANCER CENTER ONLY)
Abs Immature Granulocytes: 0.02 K/uL (ref 0.00–0.07)
Basophils Absolute: 0 K/uL (ref 0.0–0.1)
Basophils Relative: 1 %
Eosinophils Absolute: 0 K/uL (ref 0.0–0.5)
Eosinophils Relative: 1 %
HCT: 36.4 % — ABNORMAL LOW (ref 39.0–52.0)
Hemoglobin: 11.4 g/dL — ABNORMAL LOW (ref 13.0–17.0)
Immature Granulocytes: 0 %
Lymphocytes Relative: 13 %
Lymphs Abs: 1 K/uL (ref 0.7–4.0)
MCH: 24.8 pg — ABNORMAL LOW (ref 26.0–34.0)
MCHC: 31.3 g/dL (ref 30.0–36.0)
MCV: 79.1 fL — ABNORMAL LOW (ref 80.0–100.0)
Monocytes Absolute: 1.1 K/uL — ABNORMAL HIGH (ref 0.1–1.0)
Monocytes Relative: 15 %
Neutro Abs: 5.3 K/uL (ref 1.7–7.7)
Neutrophils Relative %: 70 %
Platelet Count: 449 K/uL — ABNORMAL HIGH (ref 150–400)
RBC: 4.6 MIL/uL (ref 4.22–5.81)
RDW: 14.4 % (ref 11.5–15.5)
WBC Count: 7.4 K/uL (ref 4.0–10.5)
nRBC: 0 % (ref 0.0–0.2)

## 2023-11-23 LAB — CMP (CANCER CENTER ONLY)
ALT: 8 U/L (ref 0–44)
AST: 11 U/L — ABNORMAL LOW (ref 15–41)
Albumin: 3.9 g/dL (ref 3.5–5.0)
Alkaline Phosphatase: 114 U/L (ref 38–126)
Anion gap: 5 (ref 5–15)
BUN: 10 mg/dL (ref 6–20)
CO2: 29 mmol/L (ref 22–32)
Calcium: 9.7 mg/dL (ref 8.9–10.3)
Chloride: 103 mmol/L (ref 98–111)
Creatinine: 0.73 mg/dL (ref 0.61–1.24)
GFR, Estimated: >60 mL/min (ref >60)
Glucose, Bld: 100 mg/dL — ABNORMAL HIGH (ref 70–99)
Potassium: 4.4 mmol/L (ref 3.5–5.1)
Sodium: 137 mmol/L (ref 135–145)
Total Bilirubin: 0.5 mg/dL (ref 0.0–1.2)
Total Protein: 7.4 g/dL (ref 6.5–8.1)

## 2023-11-23 LAB — TSH: TSH: 1.26 u[IU]/mL (ref 0.350–4.500)

## 2023-11-23 MED ORDER — TRAMADOL HCL 50 MG PO TABS: 50.0000 mg | ORAL_TABLET | Freq: Four times a day (QID) | ORAL | 0 refills | Status: DC | PRN

## 2023-11-23 MED ORDER — IOHEXOL 300 MG/ML  SOLN
100.0000 mL | Freq: Once | INTRAMUSCULAR | Status: AC | PRN
Start: 2023-11-23 — End: 2023-11-23
  Administered 2023-11-23: 100 mL via INTRAVENOUS

## 2023-11-23 MED ORDER — SODIUM CHLORIDE 0.9% FLUSH
10.0000 mL | INTRAVENOUS | Status: DC | PRN
  Administered 2023-11-23: 10 mL

## 2023-11-23 NOTE — Progress Notes (Signed)
 HEMATOLOGY/ONCOLOGY CLINIC NOTE  Date of Service: 11/23/23  Patient Care Team: Haze Kingfisher, MD as PCP - General (Family Medicine)   CHIEF COMPLAINTS/PURPOSE OF CONSULTATION:  Follow-up for continued evaluation and management of metastatic lung adenocarcinoma  Current Treatment:  Atezolizumab  maintenance   INTERVAL HISTORY:  David Berry. Is a 58 y.o. male here for continued evaluation and management of his metastatic lung adenocarcinoma. He was last seen by me on 10/11/2023. In the interim, he was evaluated by rheumatology for ongoing joint/back pain with persistent rash. He was prescribed prednisone  but has yet to take it as patient is on immunotherapy.   David Berry reports he is having new onset low back pain that radiates down his legs along with bilateral shoulder and upper extremity join pain. The pain started approximately two weeks ago and has been ongoing. He rates the pain as 10 out of 10 on a pain scale. He is taking ibuprofen  with minimal improvement. He denies any neuropathy or other neurological deficits. He does have some nausea that is well controlled with antiemetics. He continues to take daily zyrtec  which controls his rash. He denies fevers, chills, sweats, shortness of breath, chest pain or cough. He has no other complaints.   MEDICAL HISTORY:  Past Medical History:  Diagnosis Date   Anxiety    Bipolar disorder (HCC)    Blood in stool    Cancer (HCC)    Chronic bronchitis with emphysema (HCC)    pt denies this   Eczema    GERD (gastroesophageal reflux disease)    Lung cancer (HCC) dx'd 01/2019   Peripheral vascular disease (HCC)    blood clot in neck Sept 12, 2020    SURGICAL HISTORY: Past Surgical History:  Procedure Laterality Date   APPENDECTOMY     teenager   INGUINAL HERNIA REPAIR Right 2016, 2017   [facility], 2018 [facility]-removed mesh   IR IMAGING GUIDED PORT INSERTION  04/26/2019   TONSILLECTOMY     TOOTH EXTRACTION  N/A 03/14/2020   Procedure: DENTAL RESTORATION/EXTRACTIONS;  Surgeon: Sheryle Hamilton, DDS;  Location: [facility] OR;  Service: Oral Surgery;  Laterality: N/A;    SOCIAL HISTORY: Social History   Socioeconomic History   Marital status: Single    Spouse name: Not on file   Number of children: 1   Years of education: GED   Highest education level: Some college, no degree  Occupational History    Comment: fork lift driver  Tobacco Use   Smoking status: Former    Types: Cigars    Quit date: 01/20/2019    Years since quitting: 4.8    Passive exposure: Current   Smokeless tobacco: Never   Tobacco comments:    smokes 3 black and mild cigars per day  Vaping Use   Vaping status: Never Used  Substance and Sexual Activity   Alcohol use: Not Currently   Drug use: Yes    Types: Marijuana   Sexual activity: Yes  Other Topics Concern   Not on file  Social History Narrative   Lives alone   Caffeine- coffee, 20 oz daily, tea occas   Social Drivers of Health   Financial Resource Strain: Low Risk  (09/29/2020)   Overall Financial Resource Strain (CARDIA)    Difficulty of Paying Living Expenses: Not very hard  Food Insecurity: No Food Insecurity (09/29/2020)   Hunger Vital Sign    Worried About Running Out of Food in the Last Year: Never  true    Ran Out of Food in the Last Year: Never true  Transportation Needs: No Transportation Needs (09/29/2020)   PRAPARE - Administrator, Civil Service (Medical): No    Lack of Transportation (Non-Medical): No  Physical Activity: Sufficiently Active (09/29/2020)   Exercise Vital Sign    Days of Exercise per Week: 3 days    Minutes of Exercise per Session: 150+ min  Stress: No Stress Concern Present (09/29/2020)   [employer] of Occupational Health - Occupational Stress Questionnaire    Feeling of Stress : Not at all  Social Connections: Unknown (03/01/2022)   Received [facility]   Social Network    Social Network: Not on file   Intimate Partner Violence: Unknown (03/01/2022)   Received from [facility]   HITS    Physically Hurt: Not on file    Insult or Talk Down To: Not on file    Threaten Physical Harm: Not on file    Scream or Curse: Not on file    FAMILY HISTORY: Family History  Problem Relation Age of Onset   Prostate cancer Father    Colon cancer Neg Hx    Stomach cancer Neg Hx    Esophageal cancer Neg Hx     ALLERGIES:  is allergic to atorvastatin calcium.  MEDICATIONS:  Current Outpatient Medications  Medication Sig Dispense Refill   cetirizine  (ZYRTEC  ALLERGY ) 10 MG tablet Take 1 tablet (10 mg total) by mouth 2 (two) times daily as needed (hives). 60 tablet 1   Cyanocobalamin (VITAMIN B 12 PO) Take 2,500 mcg by mouth daily.     ELIQUIS  2.5 MG TABS tablet TAKE 1 TABLET(2.5 MG) BY MOUTH TWICE DAILY 60 tablet 5   esomeprazole  (NEXIUM ) 40 MG capsule TAKE 1 CAPSULE(40 MG) BY MOUTH DAILY 90 capsule 2   famotidine  (PEPCID ) 20 MG tablet TAKE 1 TABLET(20 MG) BY MOUTH TWICE DAILY 60 tablet 1   lidocaine -prilocaine  (EMLA ) cream Apply 1 Application topically as needed. 30 g 0   ondansetron  (ZOFRAN ) 4 MG tablet Take 1 tablet (4 mg total) by mouth every 8 (eight) hours as needed for nausea or vomiting. 30 tablet 1   predniSONE  (DELTASONE ) 5 MG tablet Take 1 tablet (5 mg total) by mouth daily as needed. 30 tablet 2   propranolol  (INDERAL ) 20 MG tablet TAKE 1 TABLET(20 MG) BY MOUTH TWICE DAILY 60 tablet 5   sildenafil  (VIAGRA ) 50 MG tablet Take 1 tablet (50 mg total) by mouth daily as needed for erectile dysfunction. Take 30 mins to 4 hours prior to sexual activity 30 tablet 1   traMADol  (ULTRAM ) 50 MG tablet Take 1 tablet (50 mg total) by mouth every 6 (six) hours as needed. 30 tablet 0   No current facility-administered medications for this visit.   Facility-Administered Medications Ordered in Other Visits  Medication Dose Route Frequency Provider Last Rate Last Admin   sodium chloride  flush (NS) 0.9 %  injection 10 mL  10 mL Intracatheter PRN Onesimo Emaline Brink, MD   10 mL at 05/18/23 1559   REVIEW OF SYSTEMS:   10 Point review of Systems was done is negative except as noted above.  PHYSICAL EXAMINATION: ECOG FS:1 - Symptomatic but completely ambulatory  .BP (!) 127/103 (BP Location: Right Arm, Patient Position: Sitting) Comment: 102/72  Pulse 81   Temp 98.4 F (36.9 C) (Tympanic)   Resp 18   Ht 5' 10 (1.778 m)   Wt 128 lb 9.6 oz (58.3 kg)  SpO2 100%   BMI 18.45 kg/m    Wt Readings from Last 3 Encounters:  11/23/23 128 lb 9.6 oz (58.3 kg)  11/18/23 130 lb 9.6 oz (59.2 kg)  11/02/23 131 lb 8 oz (59.6 kg)   Body mass index is 18.45 kg/m.    GENERAL:alert, in no acute distress and comfortable SKIN: no acute rashes, no significant lesions EYES: conjunctiva are pink and non-injected, sclera anicteric LUNGS: clear to auscultation b/l with normal respiratory effort HEART: regular rate & rhythm Extremity: no pedal edema PSYCH: alert & oriented x 3 with fluent speech NEURO: no focal motor/sensory deficits  LABORATORY DATA:  I have reviewed the data as listed    Latest Ref Rng & Units 11/23/2023    9:56 AM 11/02/2023    8:11 AM 10/11/2023    8:05 AM  CBC  WBC 4.0 - 10.5 K/uL 7.4  4.6  5.6   Hemoglobin 13.0 - 17.0 g/dL 88.5  89.0  88.4   Hematocrit 39.0 - 52.0 % 36.4  34.5  36.1   Platelets 150 - 400 K/uL 449  318  320       Latest Ref Rng & Units 11/23/2023    9:56 AM 11/02/2023    8:11 AM 10/11/2023    8:05 AM  CMP  Glucose 70 - 99 mg/dL 899  97  894   BUN 6 - 20 mg/dL 10  9  11    Creatinine 0.61 - 1.24 mg/dL 9.26  9.17  9.23   Sodium 135 - 145 mmol/L 137  138  139   Potassium 3.5 - 5.1 mmol/L 4.4  3.9  3.8   Chloride 98 - 111 mmol/L 103  105  106   CO2 22 - 32 mmol/L 29  27  28    Calcium 8.9 - 10.3 mg/dL 9.7  9.0  9.1   Total Protein 6.5 - 8.1 g/dL 7.4  7.0  7.1   Total Bilirubin 0.0 - 1.2 mg/dL 0.5  0.2  0.4   Alkaline Phos 38 - 126 U/L 114  118  110   AST 15  - 41 U/L 11  15  14    ALT 0 - 44 U/L 8  10  10     Lab Results  Component Value Date   IRON  16 (L) 05/05/2022   TIBC 448 05/05/2022   FERRITIN 52 10/20/2022    01/22/2019 [facility]: Tumor Mutational Burden     01/22/2019 PD-L1 Immunohistochemistry Analysis    01/22/2019 Soft Tissue Needle Core Biopsy Surgical Pathology     RADIOGRAPHIC STUDIES: I have personally reviewed the radiological images as listed and agreed with the findings in the report. No results found.   ASSESSMENT & PLAN:  David Berry. Is a 58 y.o. male who returns for a follow up for metastatic lung cancer.    1. Metastatic Poorly differentiated lung adenocarcinoma -Presented with Large neck mass, mediastinal mass, renal mass, and questionable bone lesions -No brain mets on MRI brain -01/22/2019 PD-L1 Immunohistochemistry Analysis which revealed Tumor Proportion Score (TPS) 50% -Started Atezolizumab  on 03/21/2019. Added Carbo/taxol  with Cycle 2 on 04/10/2019. After 6 cycles, transitioned to maintenance Atezolizumab  q 21 days.  -Most recent CT CAP from 08/15/2023 showed radiation related treatment with imilar masslike fibrosis with architectural distortion in the right greater than left paramediastinal bilateral upper and lower lobes. No evidence of new or progressive disease.   2. h/o  Impending SVC syndrome: --s/p pallaitive RT  3.h/o Acute DVT- Right IJ, Right  Innominate Vein, and likely also the SVC --Related to malignancy and tobacco use --On long term Eliquis  but decreased to 2.5 mg BID due to report of BRB with wiping after BM secondary to hemorrhoids.  4. Symptomatic hemorrhoids --Currently no significant bleeding but have caused some mild iron  deficiency anemia.  5. Normocytic anemia--stable -Likely due to underlying malignancy + Iron  deficiency due to ongoing bleeding from hemorrhoids   6.Thrombocytosis--resolved --Likely due to paraneoplastic effect of tumor and reactive due to  inflammation and tissue inflammation from RT.  7. Iron  deficiency  -Likely due to blood loss from hemorrhoids -Last received IV venofer  300 mg x 2 doses from 09/16/2022-09/29/2022.  PLAN:  --Due to Cycle 32, Day 1 Atezolizumab  today.  --Labs from today were reviewed and adequate for treatment. WBC 7.4, Hgb 11.4, Plt 449, Creatinine and LFTs in range --Due to ongoing back and shoulder pain, recommend to hold treatment today. Will obtain CT CAP to evaluate for progression of disease. Scheduled for later today. --Will send tramadol  50 mg q 6 hours to help with pain.  --Continue with daily antihistamine to help with minimize rash  FOLLOW UP: RTC in 2 weeks with labs/follow up prior to Cycle 32  of Atezo.    All of the patient's questions were answered with apparent satisfaction. The patient knows to call the clinic with any problems, questions or concerns.  I have spent a total of 30 minutes minutes of face-to-face and non-face-to-face time, preparing to see the patient, performing a medically appropriate examination, counseling and educating the patient, ordering tests/procedures, referring and communicating with other health care professionals, documenting clinical information in the electronic health record, and care coordination.   Johnston Police PA-C Dept of Hematology and Oncology [facility] Phone: [phone]

## 2023-12-06 NOTE — Progress Notes (Signed)
 HEMATOLOGY/ONCOLOGY CLINIC NOTE  Date of Service: 12/07/2023  Patient Care Team: Haze Kingfisher, MD as PCP - General (Family Medicine)   CHIEF COMPLAINTS/PURPOSE OF CONSULTATION:  Follow-up for continued evaluation and management of metastatic lung adenocarcinoma  HISTORY OF PRESENTING ILLNESS:  Please see previous notes for details on initial presentation.  Current Treatment:  Atezolizumab  maintenance   INTERVAL HISTORY:   David Berry. is a 58 y.o. male here for continued evaluation and management of his metastatic lung adenocarcinoma.    Patient was last seen by me on skin rash with itching, stable hemorrhoids, finger swelling, pain in his middle finger joint when squeezing it, and occasional back pain.   He was seen by his rheumatologist, Dr. Jeannetta, on 11/18/2023. Patient notes that Dr. Jeannetta referred him to a rheumatologist at [facility]. He is unsure of when he will be seen by Dr. Jeannetta next.   He was most recently seen by Thayil PA-C on 11/23/2023 and complained of new onset of low back pain radiating down his legs, bilateral shoulder pain, upper extremity joint pain, and nausea.   Patient complains of body aches and pain in several areas, including the hamstrings, forearms, and knees related to inflammatory arthritis. He also complains of shoulder stiffness. Patient denies any unusual activitiy involving the shoulder. He also complains of stiffness in his neck. He notes having knee issues this morning related to inflammatory arthritis. Patient denies any pain in the knee on palpation.  Patient notes that he was on Prednisone  5 MG one tablet for two days, managed by Dr. Jeannetta, which improved his pain. Patient notes that he still has Prednisone  available at home.   He denies any leg pain, but does complain of leg swelling.   He reports previously elevated glucose levels. Patient notes that he has stopped consuming particularly brown liquor and coffee in his diet  due to managing his sugar intake.   He notes that he is still having hemorrhoidal bleeding.   Patient denies any known fhx of lupus, RA, or inflammatory arthritis. He notes that his mother passed away at 72 years old and is unsure of her medical history.   Patient denies any changes in breathing or abdominal pain.   MEDICAL HISTORY:  Past Medical History:  Diagnosis Date   Anxiety    Bipolar disorder (HCC)    Blood in stool    Cancer (HCC)    Chronic bronchitis with emphysema (HCC)    pt denies this   Eczema    GERD (gastroesophageal reflux disease)    Lung cancer (HCC) dx'd 01/2019   Peripheral vascular disease (HCC)    blood clot in neck Sept 12, 2020    SURGICAL HISTORY: Past Surgical History:  Procedure Laterality Date   APPENDECTOMY     teenager   INGUINAL HERNIA REPAIR Right 2016, 2017   [facility], 2018 [facility]-removed mesh   IR IMAGING GUIDED PORT INSERTION  04/26/2019   TONSILLECTOMY     TOOTH EXTRACTION N/A 03/14/2020   Procedure: DENTAL RESTORATION/EXTRACTIONS;  Surgeon: Sheryle Hamilton, DDS;  Location: [facility] OR;  Service: Oral Surgery;  Laterality: N/A;    SOCIAL HISTORY: Social History   Socioeconomic History   Marital status: Single    Spouse name: Not on file   Number of children: 1   Years of education: GED   Highest education level: Some college, no degree  Occupational History    Comment: fork lift driver  Tobacco Use  Smoking status: Former    Types: Cigars    Quit date: 01/20/2019    Years since quitting: 4.8    Passive exposure: Current   Smokeless tobacco: Never   Tobacco comments:    smokes 3 black and mild cigars per day  Vaping Use   Vaping status: Never Used  Substance and Sexual Activity   Alcohol use: Not Currently   Drug use: Yes    Types: Marijuana   Sexual activity: Yes  Other Topics Concern   Not on file  Social History Narrative   Lives alone   Caffeine- coffee, 20 oz daily, tea occas   Social Drivers of Health    Financial Resource Strain: Low Risk  (09/29/2020)   Overall Financial Resource Strain (CARDIA)    Difficulty of Paying Living Expenses: Not very hard  Food Insecurity: No Food Insecurity (09/29/2020)   Hunger Vital Sign    Worried About Running Out of Food in the Last Year: Never true    Ran Out of Food in the Last Year: Never true  Transportation Needs: No Transportation Needs (09/29/2020)   PRAPARE - Administrator, Civil Service (Medical): No    Lack of Transportation (Non-Medical): No  Physical Activity: Sufficiently Active (09/29/2020)   Exercise Vital Sign    Days of Exercise per Week: 3 days    Minutes of Exercise per Session: 150+ min  Stress: No Stress Concern Present (09/29/2020)   [employer] of Occupational Health - Occupational Stress Questionnaire    Feeling of Stress : Not at all  Social Connections: Unknown (03/01/2022)   [facility]   Social Network    Social Network: Not on file  Intimate Partner Violence: Unknown (03/01/2022)   Received from [facility]   HITS    Physically Hurt: Not on file    Insult or Talk Down To: Not on file    Threaten Physical Harm: Not on file    Scream or Curse: Not on file    FAMILY HISTORY: Family History  Problem Relation Age of Onset   Prostate cancer Father    Colon cancer Neg Hx    Stomach cancer Neg Hx    Esophageal cancer Neg Hx     ALLERGIES:  is allergic to atorvastatin calcium.  MEDICATIONS:  Current Outpatient Medications  Medication Sig Dispense Refill   cetirizine  (ZYRTEC  ALLERGY ) 10 MG tablet Take 1 tablet (10 mg total) by mouth 2 (two) times daily as needed (hives). 60 tablet 1   Cyanocobalamin (VITAMIN B 12 PO) Take 2,500 mcg by mouth daily.     ELIQUIS  2.5 MG TABS tablet TAKE 1 TABLET(2.5 MG) BY MOUTH TWICE DAILY 60 tablet 5   esomeprazole  (NEXIUM ) 40 MG capsule TAKE 1 CAPSULE(40 MG) BY MOUTH DAILY 90 capsule 2   famotidine  (PEPCID ) 20 MG tablet TAKE 1 TABLET(20 MG) BY  MOUTH TWICE DAILY 60 tablet 1   lidocaine -prilocaine  (EMLA ) cream Apply 1 Application topically as needed. 30 g 0   ondansetron  (ZOFRAN ) 4 MG tablet Take 1 tablet (4 mg total) by mouth every 8 (eight) hours as needed for nausea or vomiting. 30 tablet 1   predniSONE  (DELTASONE ) 5 MG tablet Take 1 tablet (5 mg total) by mouth daily as needed. 30 tablet 2   propranolol  (INDERAL ) 20 MG tablet TAKE 1 TABLET(20 MG) BY MOUTH TWICE DAILY 60 tablet 5   sildenafil  (VIAGRA ) 50 MG tablet Take 1 tablet (50 mg total) by mouth daily as needed for  erectile dysfunction. Take 30 mins to 4 hours prior to sexual activity 30 tablet 1   traMADol  (ULTRAM ) 50 MG tablet Take 1 tablet (50 mg total) by mouth every 6 (six) hours as needed. 30 tablet 0   No current facility-administered medications for this visit.   Facility-Administered Medications Ordered in Other Visits  Medication Dose Route Frequency Provider Last Rate Last Admin   sodium chloride  flush (NS) 0.9 % injection 10 mL  10 mL Intracatheter PRN Onesimo Emaline Brink, MD   10 mL at 05/18/23 1559   REVIEW OF SYSTEMS:    10 Point review of Systems was done is negative except as noted above.   PHYSICAL EXAMINATION: ECOG FS:1 - Symptomatic but completely ambulatory .BP 109/83   Pulse 85   Temp 97.7 F (36.5 C)   Resp 20   Wt 128 lb 1.6 oz (58.1 kg)   SpO2 100%   BMI 18.38 kg/m   Wt Readings from Last 3 Encounters:  11/23/23 128 lb 9.6 oz (58.3 kg)  11/18/23 130 lb 9.6 oz (59.2 kg)  11/02/23 131 lb 8 oz (59.6 kg)   Body mass index is 18.38 kg/m.    GENERAL:alert, in no acute distress and comfortable SKIN: no acute rashes, no significant lesions EYES: conjunctiva are pink and non-injected, sclera anicteric OROPHARYNX: MMM, no exudates, no oropharyngeal erythema or ulceration NECK: supple, no JVD LYMPH:  no palpable lymphadenopathy in the cervical, axillary or inguinal regions LUNGS: clear to auscultation b/l with normal respiratory  effort HEART: regular rate & rhythm ABDOMEN:  normoactive bowel sounds , non tender, not distended. Extremity: no pedal edema PSYCH: alert & oriented x 3 with fluent speech NEURO: no focal motor/sensory deficits   LABORATORY DATA:  I have reviewed the data as listed    Latest Ref Rng & Units 12/07/2023    8:13 AM 11/23/2023    9:56 AM 11/02/2023    8:11 AM  CBC  WBC 4.0 - 10.5 K/uL 8.2  7.4  4.6   Hemoglobin 13.0 - 17.0 g/dL 89.0  88.5  89.0   Hematocrit 39.0 - 52.0 % 34.3  36.4  34.5   Platelets 150 - 400 K/uL 500  449  318       Latest Ref Rng & Units 12/07/2023    8:13 AM 11/23/2023    9:56 AM 11/02/2023    8:11 AM  CMP  Glucose 70 - 99 mg/dL 881  899  97   BUN 6 - 20 mg/dL 10  10  9    Creatinine 0.61 - 1.24 mg/dL 9.26  9.26  9.17   Sodium 135 - 145 mmol/L 137  137  138   Potassium 3.5 - 5.1 mmol/L 3.7  4.4  3.9   Chloride 98 - 111 mmol/L 102  103  105   CO2 22 - 32 mmol/L 27  29  27    Calcium 8.9 - 10.3 mg/dL 9.4  9.7  9.0   Total Protein 6.5 - 8.1 g/dL 7.4  7.4  7.0   Total Bilirubin 0.0 - 1.2 mg/dL 0.4  0.5  0.2   Alkaline Phos 38 - 126 U/L 114  114  118   AST 15 - 41 U/L 14  11  15    ALT 0 - 44 U/L 14  8  10     Lab Results  Component Value Date   IRON  16 (L) 05/05/2022   TIBC 448 05/05/2022   FERRITIN 52 10/20/2022    01/22/2019 [facility]:  Tumor Mutational Burden     01/22/2019 PD-L1 Immunohistochemistry Analysis    01/22/2019 Soft Tissue Needle Core Biopsy Surgical Pathology     RADIOGRAPHIC STUDIES: I have personally reviewed the radiological images as listed and agreed with the findings in the report. CT CHEST ABDOMEN PELVIS W CONTRAST Result Date: 11/23/2023 CLINICAL DATA:  Low back pain, shoulder pain, evaluate for progression of lung cancer * Tracking Code: BO * EXAM: CT CHEST, ABDOMEN, AND PELVIS WITH CONTRAST TECHNIQUE: Multidetector CT imaging of the chest, abdomen and pelvis was performed following the standard protocol during bolus  administration of intravenous contrast. RADIATION DOSE REDUCTION: This exam was performed according to the departmental dose-optimization program which includes automated exposure control, adjustment of the mA and/or kV according to patient size and/or use of iterative reconstruction technique. CONTRAST:  OMNIPAQUE  IOHEXOL  300 MG/ML  SOLN COMPARISON:  08/15/2023 FINDINGS: CT CHEST FINDINGS Cardiovascular: Left chest port catheter. Normal heart size. No pericardial effusion. Mediastinum/Nodes: Unchanged treated pretracheal and right soft tissue. No discretely enlarged lymph nodes (series 2, image 25). Thyroid  gland, trachea, and esophagus demonstrate no significant findings. Lungs/Pleura: Unchanged post treatment appearance of the chest with perihilar fibrosis and consolidation of the right upper lobe with additional paramedian fibrosis in the left upper lobe (series 4, image 63). Mild underlying paraseptal and centrilobular emphysema. No pleural effusion or pneumothorax. Musculoskeletal: No chest wall abnormality. No acute osseous findings. CT ABDOMEN PELVIS FINDINGS Hepatobiliary: No solid liver abnormality is seen. No gallstones, gallbladder wall thickening, or biliary dilatation. Pancreas: Unremarkable. No pancreatic ductal dilatation or surrounding inflammatory changes. Spleen: Normal in size without significant abnormality. Adrenals/Urinary Tract: Unchanged benign right adrenal adenoma, previously characterized by MR (series 2, image 66). 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 Kidneys are normal, without renal calculi, solid lesion, or hydronephrosis. Bladder is unremarkable. Stomach/Bowel: Stomach is within normal limits. Appendix not clearly visualized. No evidence of bowel wall thickening, distention, or inflammatory changes. Vascular/Lymphatic: Scattered aortic atherosclerosis. No enlarged  abdominal or pelvic lymph nodes. Reproductive: Prostatomegaly. Other: No abdominal wall hernia or abnormality. No ascites. Musculoskeletal: No acute osseous findings. IMPRESSION: 1. No acute CT findings to explain back or shoulder pain. 2. Unchanged post treatment appearance of the chest with perihilar fibrosis and consolidation of the right upper lobe with additional paramedian fibrosis in the left upper lobe. 3. Unchanged treated pretracheal and right soft tissue. No discretely enlarged lymph nodes. 4. No evidence of lymphadenopathy or metastatic disease in the abdomen or pelvis. Aortic Atherosclerosis (ICD10-I70.0) and Emphysema (ICD10-J43.9). Electronically Signed   By: Marolyn JONETTA Jaksch M.D.   On: 11/23/2023 21:05      ASSESSMENT & PLAN:   This is a 58 year old male with   1. Metastatic Poorly differentiated lung adenocarcinoma Presented with Large neck mass, mediastinal mass, renal mass, and questionable bone lesions  no brain mets on MRI brain 01/22/2019 PD-L1 Immunohistochemistry Analysis which revealed Tumor Proportion Score (TPS) 50% 05/16/2019 PET/CT ([id number]) revealed Radiation changes in the right hemithorax, as above. Improving mediastinal nodal metastases. Prior bulky right cervical metastases have resolved. No evidence of metastatic disease in the abdomen/pelvis. 07/19/2019 Esophagus Scan ([id number]) revealed Mild stricture at the C6-7 level likely related to prior esophageal surgery. Barium tablet was slow to pass through this area but did pass through after 1 minutes. Hiatal hernia with moderate gastroesophageal reflux. Moderate stricture above the hiatal hernia likely due to reflux. Barium tablet did not pass this area. There  are changes of esophagitis which are likely due to reflux and possibly radiation as well.  10/10/19 of  CT Chest W Contrast ([id number])- no evidence of lung cancer progression at this time. He did have a PET CT scan on 04/13/2021 which was reviewed on  the previous visit by Johnston.  This showed some borderline FDG avid right paratracheal right hilar and azygoesophageal lymph nodes that were suspicious for possible nodal recurrence however the patient notes that he did have a bad upper respiratory tract infection around the time that he had his PET CT scan and therefore reactive adenopathy is a possibility as well.    2. 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 Symptomatic hemorrhoids-currently no significant bleeding but have caused some mild iron  deficiency anemia.  5. Normocytic anemia -Likely due to underlying malignancy + Iron  deficiency due to ongoing bleeding from hemorrhoids   6. S/pThrombocytosis -- likely due to paraneoplastic effect of tumor and reactive due to inflammation and tissue inflammation from RT.-- now resolved.   7. Nicotine dependence -has quit since cancer diagnosis  8. Iron  deficiency - likely due to blood loss from hemorrhoids  9.  Erectile dysfunction-having partial benefits with using Viagra .  10.  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 CBC showed WBC of 8.2K, hemoglobin of 10.9, and platelets of 500K. -hgb decreased from 12 to 10 -his RBCs, which were previously normal, are now microcytic, MCV 77.6, suggesting iron  deficiency -will add iron  labs and evaluate whether there is any need for IV iron   -11/23/2023 CT chest/abdm/pelvis scan ordered by Thayil PA-C showed no findings of new cancer.  -it has been nearly 5 years since patient has been treated for metastatic lung cancer -Patient has no lab or clinical evidence of lung cancer recurrence/progression at this time.  -discussed that his skin rashes and joint/muscle pain are inflammatory, and either directly related  to immunotherapy and should settle down over several months, or there is another autoimmune condition present, which worsened with immunotherapy -we discussed that we are aiming to balance two concerns of addressing inflammatory pain with steroids, and also managing steroids so that his body is still able to control his cancer as much as possible.  -discussed that there is a role for using lowest-dose steroids possible such that it does not suppress the immune systm too much. Also discussed that after that, there is the option of steroid-sparing agents to reduce inflammation such as Plaquenil -discussed that it is possible that immunotherapy can cause inflammation and cause the immune system to be overactive -discussed that if he is disease-free for at least 2 years, it would be reasonable to stop treatment and monitor -will hold off on Atezolizumab  immunotherapy at this time while there is active inflammation -Discussed that it would be reasonable to restart Prednisone  at low dose. Take two 5MG  tablets of Prednisone  for 3 days, then one tablet daily for the next couple of weeks. We discussed that Prednisone  use will not be long-term.  -patient shall return to clinic with us  in 2 months -Continue to follow up with rheumatology for management of immunotherapy-related inflammatory arthritis. Patient shall connect with Dr. Jeannetta to confirm his next appointment -answered all of patient's questions in detail  FOLLOW UP: Hold atezolizumab  treatments at this time.  Return to clinic with Dr. Onesimo with port flush and labs in 2 months Follow-up with Dr. Jeannetta from rheumatology for management of immunotherapy related inflammatory arthritis.  The total time spent in the appointment was 32 minutes* .  All of the patient's questions were answered with apparent satisfaction. The patient knows to call the clinic with any problems, questions or concerns.   Emaline Onesimo MD MS AAHIVMS [facility] Hematology/Oncology  Physician [facility]  .*Total Encounter Time as defined by the Centers for Medicare and Medicaid Services includes, in addition to the face-to-face time of a patient visit (documented in the note above) non-face-to-face time: obtaining and reviewing outside history, ordering and reviewing medications, tests or procedures, care coordination (communications with other health care professionals or caregivers) and documentation in the medical record.    I,Mitra Faeizi,acting as a Neurosurgeon for Emaline Onesimo, MD.,have documented all relevant documentation on the behalf of Emaline Onesimo, MD,as directed by  Emaline Onesimo, MD while in the presence of Emaline Onesimo, MD.  .I have reviewed the above documentation for accuracy and completeness, and I agree with the above. .Nesiah Jump Kishore Kerah Hardebeck MD

## 2023-12-07 ENCOUNTER — Inpatient Hospital Stay (HOSPITAL_BASED_OUTPATIENT_CLINIC_OR_DEPARTMENT_OTHER): Admitting: Hematology

## 2023-12-07 ENCOUNTER — Inpatient Hospital Stay

## 2023-12-07 ENCOUNTER — Other Ambulatory Visit: Payer: Self-pay

## 2023-12-07 ENCOUNTER — Other Ambulatory Visit: Payer: Self-pay | Admitting: Internal Medicine

## 2023-12-07 VITALS — BP 109/83 | HR 85 | Temp 97.7°F | Resp 20 | Wt 128.1 lb

## 2023-12-07 DIAGNOSIS — C3491 Malignant neoplasm of unspecified part of right bronchus or lung: Secondary | ICD-10-CM

## 2023-12-07 DIAGNOSIS — Z5112 Encounter for antineoplastic immunotherapy: Secondary | ICD-10-CM | POA: Diagnosis not present

## 2023-12-07 DIAGNOSIS — L509 Urticaria, unspecified: Secondary | ICD-10-CM

## 2023-12-07 LAB — CBC WITH DIFFERENTIAL (CANCER CENTER ONLY)
Abs Immature Granulocytes: 0.02 K/uL (ref 0.00–0.07)
Basophils Absolute: 0 K/uL (ref 0.0–0.1)
Basophils Relative: 0 %
Eosinophils Absolute: 0 K/uL (ref 0.0–0.5)
Eosinophils Relative: 0 %
HCT: 34.3 % — ABNORMAL LOW (ref 39.0–52.0)
Hemoglobin: 10.9 g/dL — ABNORMAL LOW (ref 13.0–17.0)
Immature Granulocytes: 0 %
Lymphocytes Relative: 11 %
Lymphs Abs: 0.9 K/uL (ref 0.7–4.0)
MCH: 24.7 pg — ABNORMAL LOW (ref 26.0–34.0)
MCHC: 31.8 g/dL (ref 30.0–36.0)
MCV: 77.6 fL — ABNORMAL LOW (ref 80.0–100.0)
Monocytes Absolute: 1 K/uL (ref 0.1–1.0)
Monocytes Relative: 13 %
Neutro Abs: 6.2 K/uL (ref 1.7–7.7)
Neutrophils Relative %: 76 %
Platelet Count: 500 K/uL — ABNORMAL HIGH (ref 150–400)
RBC: 4.42 MIL/uL (ref 4.22–5.81)
RDW: 14.2 % (ref 11.5–15.5)
WBC Count: 8.2 K/uL (ref 4.0–10.5)
nRBC: 0 % (ref 0.0–0.2)

## 2023-12-07 LAB — CMP (CANCER CENTER ONLY)
ALT: 14 U/L (ref 0–44)
AST: 14 U/L — ABNORMAL LOW (ref 15–41)
Albumin: 3.8 g/dL (ref 3.5–5.0)
Alkaline Phosphatase: 114 U/L (ref 38–126)
Anion gap: 8 (ref 5–15)
BUN: 10 mg/dL (ref 6–20)
CO2: 27 mmol/L (ref 22–32)
Calcium: 9.4 mg/dL (ref 8.9–10.3)
Chloride: 102 mmol/L (ref 98–111)
Creatinine: 0.73 mg/dL (ref 0.61–1.24)
GFR, Estimated: >60 mL/min (ref >60)
Glucose, Bld: 118 mg/dL — ABNORMAL HIGH (ref 70–99)
Potassium: 3.7 mmol/L (ref 3.5–5.1)
Sodium: 137 mmol/L (ref 135–145)
Total Bilirubin: 0.4 mg/dL (ref 0.0–1.2)
Total Protein: 7.4 g/dL (ref 6.5–8.1)

## 2023-12-07 LAB — TSH: TSH: 1.23 u[IU]/mL (ref 0.350–4.500)

## 2023-12-07 MED ORDER — SODIUM CHLORIDE 0.9% FLUSH
10.0000 mL | INTRAVENOUS | Status: DC | PRN
Start: 2023-12-07 — End: 2023-12-07
  Administered 2023-12-07: 10 mL

## 2023-12-08 NOTE — Telephone Encounter (Signed)
 Dr. Buckley has not seen him since 2023. Need to confer with Dr. Onesimo to find out if refill is appropriate since Buckley is out of the office.

## 2023-12-13 ENCOUNTER — Encounter: Payer: Self-pay | Admitting: Hematology

## 2023-12-13 ENCOUNTER — Encounter: Payer: Self-pay | Admitting: Physician Assistant

## 2023-12-14 ENCOUNTER — Inpatient Hospital Stay

## 2023-12-14 ENCOUNTER — Encounter: Payer: Self-pay | Admitting: Physician Assistant

## 2023-12-14 ENCOUNTER — Inpatient Hospital Stay: Attending: Physician Assistant

## 2023-12-14 ENCOUNTER — Encounter: Payer: Self-pay | Admitting: Hematology

## 2023-12-14 ENCOUNTER — Inpatient Hospital Stay: Admitting: Hematology

## 2023-12-14 ENCOUNTER — Telehealth: Payer: Self-pay

## 2023-12-14 NOTE — Telephone Encounter (Signed)
 Called David Berry back to inform him that his propanolol has been filled at [facility] on [address]. I apologized for the delay in his refill request. He understands and will call us  back with any other issues. Andrea CHRISTELLA Plunk, RN

## 2023-12-17 ENCOUNTER — Other Ambulatory Visit: Payer: Self-pay | Admitting: Hematology

## 2023-12-19 ENCOUNTER — Encounter: Payer: Self-pay | Admitting: Hematology

## 2023-12-19 ENCOUNTER — Encounter: Payer: Self-pay | Admitting: Physician Assistant

## 2023-12-28 ENCOUNTER — Inpatient Hospital Stay

## 2023-12-28 ENCOUNTER — Inpatient Hospital Stay: Admitting: Hematology

## 2024-01-02 ENCOUNTER — Encounter: Payer: Self-pay | Admitting: Physician Assistant

## 2024-01-02 ENCOUNTER — Encounter: Payer: Self-pay | Admitting: Hematology

## 2024-01-03 ENCOUNTER — Encounter: Payer: Self-pay | Admitting: Internal Medicine

## 2024-01-03 ENCOUNTER — Ambulatory Visit: Attending: Internal Medicine | Admitting: Internal Medicine

## 2024-01-03 VITALS — BP 82/62 | HR 72 | Resp 16 | Ht 70.0 in | Wt 130.2 lb

## 2024-01-03 DIAGNOSIS — M7989 Other specified soft tissue disorders: Secondary | ICD-10-CM

## 2024-01-03 DIAGNOSIS — M1990 Unspecified osteoarthritis, unspecified site: Secondary | ICD-10-CM

## 2024-01-03 DIAGNOSIS — M25511 Pain in right shoulder: Secondary | ICD-10-CM | POA: Diagnosis not present

## 2024-01-03 DIAGNOSIS — R768 Other specified abnormal immunological findings in serum: Secondary | ICD-10-CM | POA: Diagnosis not present

## 2024-01-03 MED ORDER — LIDOCAINE HCL 1 % IJ SOLN
2.0000 mL | INTRAMUSCULAR | Status: AC | PRN
  Administered 2024-01-03: 2 mL

## 2024-01-03 MED ORDER — HYDROXYCHLOROQUINE SULFATE 200 MG PO TABS: 200.0000 mg | ORAL_TABLET | Freq: Every day | ORAL | 2 refills | Status: DC

## 2024-01-03 MED ORDER — TRIAMCINOLONE ACETONIDE 40 MG/ML IJ SUSP
40.0000 mg | INTRAMUSCULAR | Status: AC | PRN
  Administered 2024-01-03: 40 mg via INTRA_ARTICULAR

## 2024-01-03 MED ORDER — PREDNISONE 5 MG PO TABS: 5.0000 mg | ORAL_TABLET | Freq: Every day | ORAL | 0 refills | Status: DC

## 2024-01-03 NOTE — Progress Notes (Signed)
 Office Visit Note  Patient: David Berry.             Date of Birth: March 19, 1966           MRN: [id number]             PCP: Haze Kingfisher, MD Referring: Haze Kingfisher, MD Visit Date: 01/03/2024   Subjective:  Joint Pain (And inflammation of the hands, neck, ankles) and Follow-up (Soreness all over  )   Discussed the use of AI scribe software for clinical note transcription with the patient, who gave verbal consent to proceed.  History of Present Illness   David Berry. is a 58 year old male with inflammatory arthritis who presents with worsening joint pain and swelling.  He has experienced increased joint pain and swelling, particularly in his shoulders and feet, over the past month. The pain is severe enough to affect his ability to lift his arms overhead and perform daily activities such as driving. He describes stiffness after sitting for a few minutes, requiring him to 'redo it again and get this thing working.'  He is currently taking prednisone  5 mg daily, which he has been using consistently. He recalls being on a higher dose temporarily but is now on a lower dose. He also takes ibuprofen  800 mg, which he obtains from his sister as easier to swallow than 4 regular strength tablets, to manage the pain, although he is concerned about potential stomach issues.  He notes swelling in both feet, with the right ankle being particularly swollen. His symptoms were worse about a month ago but have started to ease slightly. However, he still experiences significant pain and stiffness, especially in his shoulders, which he describes as 'frozen.'  He has a history of being on atezolizumab  immunotherapy, which was paused. He has been off the immunotherapy since July and is uncertain if his current symptoms are related to the immunotherapy or a pre-existing condition. He was resumed on low dose prednisone  after follow up with Dr. Onesimo.  Unfortunately previous discussion to  see Dr. Jonah with [facility] who had an interest in immunotherapy related events fell through, as she left that practice.   Previous HPI 11/18/23 David Berry. is a 58 y.o. 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   He is currently undergoing immunotherapy with Tecentriq  for lung cancer. Previously, he was on prednisone , which effectively managed his joint pain, but its use has been reduced due to potential interactions with his cancer treatment. He experiences significant discomfort, noting that he was awake from 1 AM due to pain.   He experiences muscle pain primarily in his shoulders and neck, describing it as similar to post-exercise soreness. The pain worsens with movement, especially when raising his arms overhead, and has been problematic for the past few days. The pain is diffuse across the shoulders and neck.   He is concerned about weight loss, observing 'skinny thighs' and feeling he is losing weight, questioning if he needs more protein. No swelling or visible changes in the affected areas.   He describes his back as 'crooked' and has a history of degenerative disc disease, although he does not believe this is contributing to his current shoulder and neck pain.         Previous HPI 08/12/2023 David Berry. 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   He experiences joint pain, particularly in his fingers, with a recent episode where left middle finger became significantly swollen and somewhat deviated to the side. He also has aching in his back and hips, though currently feels well. He has a history of degenerative disc disease with mild wear and tear in the lower back, and some bone spurring. He reports no significant issues with his hip joints.   His skin condition has improved with the use of  double antihistamines, specifically Zyrtec  and Pepcid , taken once daily. He notes no new breakouts and states that the treatment is effective.   Labs reviewed 06/21/23 ESR 34 CRP 11.2 RF neg UPCR wnl dsDA 264 C3/C4 wnl   Previous HPI 06/21/23 David Berry. is a 58 year old male here for evaluation of rashes, joint pain, and abnormal blood test results with positive dsDNA. He was referred by Dr. Orlan who saw him on account of urticarial rashes, which did improve on subsequent treatment with high dose zyrtec  and pepcid .   He has been on Tecentriq  (PD-L1) for lung cancer since 2020, with no changes to this treatment in the last year.   Rashes have been present since January of last year, initially associated with starting a statin medication. The rashes appear as hives, affecting his sides, back, legs, and shoulders, and are described as itchy but not painful. They typically last overnight, leaving redness the next morning. The rashes subsided while on Zyrtec  and Pepcid , but recurred after discontinuation of these medications.   Joint pain and swelling have been present since last year, coinciding with the onset of the rashes. He describes a tingling sensation in his fingers and swelling in his hands. No prior history of similar symptoms before last year. He has not been on any steroids recently, although he used them a few years ago for back problems.   He has experienced new onset headaches over the past few months, described as short and progressive.   He has a history of falling on his right side at the end of the year before last, which he associates with current hip pain. He has not had any x-rays for this issue. He describes the pain as arthritic, particularly when moving or getting up, and notes it is worse since stopping Zyrtec  and Pepcid .   Labs reviewed 04/2023 dsDNA 243 RNP, Sm, SSA, SSB neg ESR 43   Review of Systems  Constitutional:  Negative for fatigue.  HENT:   Negative for mouth sores and mouth dryness.   Eyes:  Negative for dryness.  Respiratory:  Negative for shortness of breath.   Cardiovascular:  Positive for swelling in legs/feet. Negative for chest pain and palpitations.  Gastrointestinal:  Positive for constipation. Negative for blood in stool and diarrhea.  Endocrine: Negative for increased urination.  Genitourinary:  Negative for involuntary urination.  Musculoskeletal:  Positive for joint pain, gait problem, joint pain, joint swelling, myalgias, muscle weakness, morning stiffness, muscle tenderness and myalgias.  Skin:  Positive for sensitivity to sunlight. Negative for color change, rash and hair loss.  Allergic/Immunologic: Negative for susceptible to infections.  Neurological:  Positive for dizziness. Negative for headaches.  Hematological:  Negative for swollen glands.  Psychiatric/Behavioral:  Negative for depressed mood and sleep disturbance. The patient is not nervous/anxious.     PMFS History:  Patient Active Problem List   Diagnosis Date Noted   Inflammatory arthritis 11/18/2023   High risk medication use 11/18/2023  Positive ANA (antinuclear antibody) 06/21/2023   Pain in right hip 06/21/2023   Intention tremor 04/21/2021   Mononeuropathy 04/21/2021   Chronic anticoagulation 02/28/2021   Right shoulder pain 02/28/2021   Sternoclavicular joint pain, right 02/28/2021   Encounter for immunotherapy 10/29/2020   Iron  deficiency anemia 10/29/2020   Gastroesophageal reflux disease 10/02/2020   Hypotension due to hypovolemia 10/02/2020   Port-A-Cath in place 06/13/2019   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/26/2019   Former smoker 02/26/2019   Adenocarcinoma of right lung (HCC) 02/05/2019   Counseling regarding advance care planning and goals of care 02/05/2019   Acute deep vein thrombosis (DVT) of non-extremity vein    Adenocarcinoma of lung (HCC)    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   Internal jugular (IJ) vein  thromboembolism, acute, right (HCC) 01/24/2019   Mass of right side of neck, s/p Radiation therapy 01/24/2019   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   Right renal mass 01/24/2019   Hip mass, right 01/24/2019   Thyromegaly 01/24/2019   Extravasation injury of IV catheter site with other complication (HCC)    BACK PAIN, LUMBAR 03/24/2010   LUMBAR RADICULOPATHY 03/24/2010    Past Medical History:  Diagnosis Date   Anxiety    Bipolar disorder (HCC)    Blood in stool    Cancer (HCC)    Chronic bronchitis with emphysema (HCC)    pt denies this   Eczema    GERD (gastroesophageal reflux disease)    Lung cancer (HCC) dx'd 01/2019   Peripheral vascular disease (HCC)    blood clot in neck Sept 12, 2020    Family History  Problem Relation Age of Onset   Prostate cancer Father    Colon cancer Neg Hx    Stomach cancer Neg Hx    Esophageal cancer Neg Hx    Past Surgical History:  Procedure Laterality Date   APPENDECTOMY     teenager   INGUINAL HERNIA REPAIR Right 2016, 2017   [facility], 2018 [facility]-removed mesh   IR IMAGING GUIDED PORT INSERTION  04/26/2019   TONSILLECTOMY     TOOTH EXTRACTION N/A 03/14/2020   Procedure: DENTAL RESTORATION/EXTRACTIONS;  Surgeon: Sheryle Hamilton, DDS;  Location: [facility] OR;  Service: Oral Surgery;  Laterality: N/A;   Social History   Social History Narrative   Lives alone   Caffeine- coffee, 20 oz daily, tea occas    There is no immunization history on file for this patient.   Objective: Vital Signs: BP (!) 82/62 (BP Location: Left Arm, Patient Position: Sitting, Cuff Size: Normal)   Pulse 72   Resp 16   Ht 5' 10 (1.778 m)   Wt 130 lb 3.2 oz (59.1 kg)   BMI 18.68 kg/m    Physical Exam Eyes:     Conjunctiva/sclera: Conjunctivae normal.  Cardiovascular:     Rate and Rhythm: Normal rate and regular rhythm.  Pulmonary:     Effort: Pulmonary effort is normal.     Breath sounds: Normal breath sounds.  Musculoskeletal:     Right lower leg: No  edema.     Left lower leg: No edema.  Lymphadenopathy:     Cervical: No cervical adenopathy.  Skin:    General: Skin is warm and dry.     Findings: No rash.  Neurological:     Mental Status: He is alert.  Psychiatric:        Mood and Affect: Mood normal.      Musculoskeletal Exam:  Shoulders with tight on  the right side resisting overhead abduction in both passive and active movement, left shoulder painful with active abduction but tolerates full range of motion Wrists with bilateral swelling, slight tenderness to pressure, full range of motion Fingers full ROM no tenderness or swelling Low back midline and paraspinal muscle tenderness to pressure Some pain and stiffness with hip flexion especially resisted movement Knees full ROM no tenderness or swelling Slight right ankle swelling and pain with movement  Investigation: No additional findings.  Imaging: No results found.  Recent Labs: Lab Results  Component Value Date   WBC 8.2 12/07/2023   HGB 10.9 (L) 12/07/2023   PLT 500 (H) 12/07/2023   NA 137 12/07/2023   K 3.7 12/07/2023   CL 102 12/07/2023   CO2 27 12/07/2023   GLUCOSE 118 (H) 12/07/2023   BUN 10 12/07/2023   CREATININE 0.73 12/07/2023   BILITOT 0.4 12/07/2023   ALKPHOS 114 12/07/2023   AST 14 (L) 12/07/2023   ALT 14 12/07/2023   PROT 7.4 12/07/2023   ALBUMIN 3.8 12/07/2023   CALCIUM 9.4 12/07/2023   GFRAA >60 01/30/2020    Speciality Comments: No specialty comments available.  Procedures:  Large Joint Inj: R subacromial bursa on 01/03/2024 1:40 PM Indications: pain and joint swelling Details: 27 G 1.5 in needle, lateral approach Medications: 2 mL lidocaine  1 %; 40 mg triamcinolone  acetonide 40 MG/ML Outcome: tolerated well, no immediate complications Procedure, treatment alternatives, risks and benefits explained, specific risks discussed. Consent was given by the patient. Immediately prior to procedure a time out was called to verify the correct  patient, procedure, equipment, support staff and site/side marked as required. Patient was prepped and draped in the usual sterile fashion.     Allergies: Atorvastatin calcium   Assessment / Plan:     Visit Diagnoses: Inflammatory arthritis - Plan: hydroxychloroquine  (PLAQUENIL ) 200 MG tablet Exacerbation of joint pain and swelling, either irAE 2/2 Tecentriq  or pre-existing condition with flare up due to immunotherapy.  Agree with the recommendation for monitoring symptoms while withholding medication and would anticipate improvement within a couple months in that case.  Current prednisone  insufficient with significant functional impairment from his current joint inflammation especially limitations with restricted right shoulder range of motion and pain.  Will plan to start on hydroxychloroquine  200 mg daily as a very nonaggressive immunomodulatory treatment along with the low-dose prednisone , as needed NSAIDs, and local steroid injection today for worsening right shoulder symptoms. - Continue prednisone  5 mg daily. - Start hydroxychloroquine  200 mg p.o. daily - Administer right shoulder joint injection at subacromial bursa - Taking ibuprofen  as needed, hopefully can decrease going forwards after treatment changes  Right shoulder pain, unspecified chronicity Has had previous right shoulder injection for pain that was thought to be multifactorial with sports medicine clinic in 2022.  At the time there was some questionable rotator cuff tendinopathy changes on ultrasound imaging but did not have dedicated follow-up studies.  Positive ANA (antinuclear antibody) - Plan: predniSONE  (DELTASONE ) 5 MG tablet No other specific clinical criteria on exam or history today to suggest true systemic or drug-induced lupus despite the moderately high positive double-stranded DNA antibodies.  Regardless glucocorticoids and hydroxychloroquine  would be initial first-line treatments for this as  well.   Orders: Orders Placed This Encounter  Procedures   Large Joint Inj   Meds ordered this encounter  Medications   hydroxychloroquine  (PLAQUENIL ) 200 MG tablet    Sig: Take 1 tablet (200 mg total) by mouth daily.    Dispense:  30 tablet    Refill:  2   predniSONE  (DELTASONE ) 5 MG tablet    Sig: Take 1 tablet (5 mg total) by mouth daily with breakfast.    Dispense:  90 tablet    Refill:  0     Follow-Up Instructions: No follow-ups on file.   Lonni LELON Ester, MD  Note - This record has been created using [employer].  Chart creation errors have been sought, but may not always  have been located. Such creation errors do not reflect on  the standard of medical care.

## 2024-01-12 ENCOUNTER — Encounter: Payer: Self-pay | Admitting: Hematology

## 2024-01-12 ENCOUNTER — Encounter: Payer: Self-pay | Admitting: Physician Assistant

## 2024-01-18 ENCOUNTER — Ambulatory Visit

## 2024-01-18 ENCOUNTER — Ambulatory Visit: Admitting: Physician Assistant

## 2024-01-18 ENCOUNTER — Other Ambulatory Visit

## 2024-01-31 ENCOUNTER — Other Ambulatory Visit: Payer: Self-pay

## 2024-01-31 DIAGNOSIS — C3491 Malignant neoplasm of unspecified part of right bronchus or lung: Secondary | ICD-10-CM

## 2024-02-01 ENCOUNTER — Inpatient Hospital Stay: Attending: Physician Assistant

## 2024-02-01 ENCOUNTER — Inpatient Hospital Stay (HOSPITAL_BASED_OUTPATIENT_CLINIC_OR_DEPARTMENT_OTHER): Admitting: Hematology

## 2024-02-01 VITALS — BP 100/69 | HR 69 | Temp 97.9°F | Resp 20 | Wt 125.2 lb

## 2024-02-01 DIAGNOSIS — D649 Anemia, unspecified: Secondary | ICD-10-CM | POA: Diagnosis not present

## 2024-02-01 DIAGNOSIS — C3491 Malignant neoplasm of unspecified part of right bronchus or lung: Secondary | ICD-10-CM | POA: Diagnosis not present

## 2024-02-01 DIAGNOSIS — Z7189 Other specified counseling: Secondary | ICD-10-CM

## 2024-02-01 DIAGNOSIS — C771 Secondary and unspecified malignant neoplasm of intrathoracic lymph nodes: Secondary | ICD-10-CM | POA: Diagnosis present

## 2024-02-01 DIAGNOSIS — Z86718 Personal history of other venous thrombosis and embolism: Secondary | ICD-10-CM | POA: Insufficient documentation

## 2024-02-01 DIAGNOSIS — Z87891 Personal history of nicotine dependence: Secondary | ICD-10-CM | POA: Insufficient documentation

## 2024-02-01 LAB — CBC WITH DIFFERENTIAL (CANCER CENTER ONLY)
Abs Immature Granulocytes: 0.02 K/uL (ref 0.00–0.07)
Basophils Absolute: 0 K/uL (ref 0.0–0.1)
Basophils Relative: 0 %
Eosinophils Absolute: 0 K/uL (ref 0.0–0.5)
Eosinophils Relative: 1 %
HCT: 35.3 % — ABNORMAL LOW (ref 39.0–52.0)
Hemoglobin: 10.8 g/dL — ABNORMAL LOW (ref 13.0–17.0)
Immature Granulocytes: 0 %
Lymphocytes Relative: 10 %
Lymphs Abs: 0.7 K/uL (ref 0.7–4.0)
MCH: 23.5 pg — ABNORMAL LOW (ref 26.0–34.0)
MCHC: 30.6 g/dL (ref 30.0–36.0)
MCV: 76.9 fL — ABNORMAL LOW (ref 80.0–100.0)
Monocytes Absolute: 0.7 K/uL (ref 0.1–1.0)
Monocytes Relative: 11 %
Neutro Abs: 5.1 K/uL (ref 1.7–7.7)
Neutrophils Relative %: 78 %
Platelet Count: 276 K/uL (ref 150–400)
RBC: 4.59 MIL/uL (ref 4.22–5.81)
RDW: 16.1 % — ABNORMAL HIGH (ref 11.5–15.5)
WBC Count: 6.5 K/uL (ref 4.0–10.5)
nRBC: 0 % (ref 0.0–0.2)

## 2024-02-01 LAB — CMP (CANCER CENTER ONLY)
ALT: 9 U/L (ref 0–44)
AST: 11 U/L — ABNORMAL LOW (ref 15–41)
Albumin: 4.1 g/dL (ref 3.5–5.0)
Alkaline Phosphatase: 88 U/L (ref 38–126)
Anion gap: 4 — ABNORMAL LOW (ref 5–15)
BUN: 9 mg/dL (ref 6–20)
CO2: 29 mmol/L (ref 22–32)
Calcium: 9.2 mg/dL (ref 8.9–10.3)
Chloride: 104 mmol/L (ref 98–111)
Creatinine: 0.7 mg/dL (ref 0.61–1.24)
GFR, Estimated: >60 mL/min (ref >60)
Glucose, Bld: 96 mg/dL (ref 70–99)
Potassium: 4.1 mmol/L (ref 3.5–5.1)
Sodium: 137 mmol/L (ref 135–145)
Total Bilirubin: 0.4 mg/dL (ref 0.0–1.2)
Total Protein: 7 g/dL (ref 6.5–8.1)

## 2024-02-01 LAB — FERRITIN: Ferritin: 20 ng/mL — ABNORMAL LOW (ref 24–336)

## 2024-02-01 LAB — IRON AND IRON BINDING CAPACITY (CC-WL,HP ONLY)
Iron: 24 ug/dL — ABNORMAL LOW (ref 45–182)
Saturation Ratios: 6 % — ABNORMAL LOW (ref 17.9–39.5)
TIBC: 395 ug/dL (ref 250–450)
UIBC: 371 ug/dL (ref 117–376)

## 2024-02-01 LAB — TSH: TSH: 1 u[IU]/mL (ref 0.350–4.500)

## 2024-02-01 MED ORDER — SILDENAFIL CITRATE 50 MG PO TABS: 50.0000 mg | ORAL_TABLET | Freq: Every day | ORAL | 1 refills | Status: AC | PRN

## 2024-02-01 MED ORDER — BOOST HIGH PROTEIN PO LIQD: 1.0000 | Freq: Three times a day (TID) | ORAL | 1 refills | Status: AC

## 2024-02-01 NOTE — Progress Notes (Signed)
 HEMATOLOGY ONCOLOGY PROGRESS NOTE  Date of service: 02/01/2024  Patient Care Team: Haze Kingfisher, MD as PCP - General (Family Medicine)  CHIEF COMPLAINTS/PURPOSE OF CONSULTATION:  Follow-up for continued evaluation and management of metastatic lung adenocarcinoma  HISTORY OF PRESENTING ILLNESS:  Please see previous notes for details on initial presentation.  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  SUMMARY OF ONCOLOGIC HISTORY: Oncology History  Adenocarcinoma of right lung (HCC)  02/05/2019 Initial Diagnosis   Adenocarcinoma of right lung (HCC)   02/12/2019 - 02/26/2019 Chemotherapy   The patient had dexamethasone  (DECADRON ) 4 MG tablet, 8 mg, Oral, Daily, 1 of 1 cycle, Start date: 02/07/2019, End date: 03/12/2019 palonosetron  (ALOXI ) injection 0.25 mg, 0.25 mg, Intravenous,  Once, 3 of 4 cycles Administration: 0.25 mg (02/12/2019), 0.25 mg (02/19/2019), 0.25 mg (02/26/2019) CARBOplatin  (PARAPLATIN ) 220 mg in sodium chloride  0.9 % 250 mL chemo infusion, 220 mg (96.3 % of original dose 226.6 mg), Intravenous,  Once, 3 of 4 cycles Dose modification:   (original dose 226.6 mg, Cycle 1) Administration: 220 mg (02/12/2019), 220 mg (02/26/2019) PACLitaxel  (TAXOL ) 78 mg in sodium chloride  0.9 % 250 mL chemo infusion (</= 80mg /m2), 45 mg/m2 = 78 mg, Intravenous,  Once, 3 of 4 cycles Administration: 78 mg (02/12/2019), 78 mg (02/26/2019)  for chemotherapy treatment.    03/21/2019 - 01/20/2022 Chemotherapy   Patient is on Treatment Plan : LUNG Atezolizumab   q21d Maintenance     02/10/2022 -  Chemotherapy   Patient is on Treatment Plan : LUNG NSCLC Atezolizumab  q21d       INTERVAL HISTORY:  David Berry. is a 58 y.o. male here for continued evaluation and management of his metastatic lung adenocarcinoma.   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   Today, he reports he is still having knee pain/joint pain with his shoulders being the most affected, including his ROM, but these are usually worse in the morning and improves throughout the day. He was seen by Dr. Jeannetta on 01/03/2024, who prescribed Prednisone  5 mg daily and Plaquenil  200 mg daily.   He does note having a decreased appetite, attributed to health anxiety. He has been using Boost protein drinks to supplement his diet. Additionally, he notes oral/dental issues - does not have a dentist - causing difficulty while chewing/eating as well as esophageal stricture, which he has noticed food getting stuck if he tries to swallow large pieces. Other related symptoms are nausea and reflux, especially when eating early in the morning, which he has been managing with Nexium  40 mg daily and Pepcid  20 mg BID.  Reports some spot hemorrhoidal bleeding, but has been using Preparation-H for management, as well as trying to have daily bowel movements, though he has noticed himself straining occasionally. He is taking Eliquis  2.5 BID, which he has been compliant with and denies having any side effects.  Positively, he says that his hives have improved, though this morning did notice mild urticaria. He is still experiencing a moderate swelling reaction to mosquito bites, but this has improved since starting Zyrtec  10 mg BID.  Denies breathing difficulties.  REVIEW OF SYSTEMS:    10 Point review of systems of done and is negative except as noted above.  . Past Medical History:  Diagnosis Date  Anxiety    Bipolar disorder (HCC)    Blood in stool    Cancer (HCC)    Chronic bronchitis with emphysema (HCC)    pt denies this   Eczema    GERD (gastroesophageal reflux disease)    Lung cancer (HCC) dx'd 01/2019   Peripheral vascular disease    blood clot  in neck Sept 12, 2020    . Past Surgical History:  Procedure Laterality Date   APPENDECTOMY     teenager   INGUINAL HERNIA REPAIR Right 2016, 2017   [facility], 2018 [facility]-removed mesh   IR IMAGING GUIDED PORT INSERTION  04/26/2019   TONSILLECTOMY     TOOTH EXTRACTION N/A 03/14/2020   Procedure: DENTAL RESTORATION/EXTRACTIONS;  Surgeon: Sheryle Hamilton, DDS;  Location: [facility] OR;  Service: Oral Surgery;  Laterality: N/A;    . Social History   Tobacco Use   Smoking status: Former    Types: Cigars    Quit date: 01/20/2019    Years since quitting: 5.0    Passive exposure: Current   Smokeless tobacco: Never   Tobacco comments:    smokes 3 black and mild cigars per day  Vaping Use   Vaping status: Never Used  Substance Use Topics   Alcohol use: Not Currently   Drug use: Yes    Types: Marijuana    ALLERGIES:  is allergic to atorvastatin calcium.  MEDICATIONS:  Current Outpatient Medications  Medication Sig Dispense Refill   feeding supplement (BOOST HIGH PROTEIN) LIQD Take 237 mLs by mouth 3 (three) times daily between meals. 63990 mL 1   cetirizine  (ZYRTEC  ALLERGY ) 10 MG tablet Take 1 tablet (10 mg total) by mouth 2 (two) times daily as needed (hives). 60 tablet 1   Cyanocobalamin (VITAMIN B 12 PO) Take 2,500 mcg by mouth daily.     ELIQUIS  2.5 MG TABS tablet TAKE 1 TABLET(2.5 MG) BY MOUTH TWICE DAILY 60 tablet 5   esomeprazole  (NEXIUM ) 40 MG capsule TAKE 1 CAPSULE(40 MG) BY MOUTH DAILY 90 capsule 2   famotidine  (PEPCID ) 20 MG tablet TAKE 1 TABLET(20 MG) BY MOUTH TWICE DAILY 60 tablet 1   hydroxychloroquine  (PLAQUENIL ) 200 MG tablet Take 1 tablet (200 mg total) by mouth daily. 30 tablet 2   ibuprofen  (ADVIL ) 800 MG tablet Take 800 mg by mouth every 8 (eight) hours as needed.     lidocaine -prilocaine  (EMLA ) cream Apply 1 Application topically as needed. 30 g 0   ondansetron  (ZOFRAN ) 4 MG tablet Take 1 tablet (4 mg total) by mouth every 8 (eight) hours as needed for  nausea or vomiting. 30 tablet 1   predniSONE  (DELTASONE ) 5 MG tablet Take 1 tablet (5 mg total) by mouth daily with breakfast. 90 tablet 0   propranolol  (INDERAL ) 20 MG tablet TAKE 1 TABLET(20 MG) BY MOUTH TWICE DAILY 60 tablet 5   sildenafil  (VIAGRA ) 50 MG tablet Take 1 tablet (50 mg total) by mouth daily as needed for erectile dysfunction. Take 30 mins to 4 hours prior to sexual activity 30 tablet 1   No current facility-administered medications for this visit.   Facility-Administered Medications Ordered in Other Visits  Medication Dose Route Frequency Provider Last Rate Last Admin   sodium chloride  flush (NS) 0.9 % injection 10 mL  10 mL Intracatheter PRN Ernesto Lashway Kishore, MD   10 mL at 05/18/23 1559    PHYSICAL EXAMINATION: ECOG PERFORMANCE STATUS: 1 - Symptomatic but completely ambulatory  . Vitals:   02/01/24 1130  BP: 100/69  Pulse: 69  Resp: 20  Temp: 97.9 F (36.6 C)  SpO2: 97%    Filed Weights   02/01/24 1130  Weight: 125 lb 3.2 oz (56.8 kg)   .Body mass index is 17.96 kg/m.  GENERAL:alert, in no acute distress and comfortable SKIN: no acute rashes, no significant lesions EYES: conjunctiva are pink and non-injected, sclera anicteric OROPHARYNX: MMM, no exudates, no oropharyngeal erythema or ulceration NECK: supple, no JVD; (+) tenderness around neck and shoulder LYMPH:  no palpable lymphadenopathy in the cervical, axillary or inguinal regions LUNGS: clear to auscultation b/l with normal respiratory effort HEART: regular rate & rhythm ABDOMEN:  normoactive bowel sounds , non tender, not distended. Extremity: no pedal edema PSYCH: alert & oriented x 3 with fluent speech NEURO: no focal motor/sensory deficits  LABORATORY DATA:   I have reviewed the data as listed  .    Latest Ref Rng & Units 02/01/2024   11:18 AM 12/07/2023    8:13 AM 11/23/2023    9:56 AM  CBC EXTENDED  WBC 4.0 - 10.5 K/uL 6.5  8.2  7.4   RBC 4.22 - 5.81 MIL/uL 4.59  4.42  4.60    Hemoglobin 13.0 - 17.0 g/dL 89.1  89.0  88.5   HCT 39.0 - 52.0 % 35.3  34.3  36.4   Platelets 150 - 400 K/uL 276  500  449   NEUT# 1.7 - 7.7 K/uL 5.1  6.2  5.3   Lymph# 0.7 - 4.0 K/uL 0.7  0.9  1.0    Lab Results  Component Value Date   MCV 76.9 (L) 02/01/2024   MCV 77.6 (L) 12/07/2023   MCV 79.1 (L) 11/23/2023    Latest Reference Range & Units 02/11/21 15:01 05/05/22 14:32 02/01/24 11:18  Iron  45 - 182 ug/dL 17 (L) 16 (L) 24 (L)  UIBC 117 - 376 ug/dL 655 567 628  TIBC 749 - 450 ug/dL 638 551 604  Saturation Ratios 17.9 - 39.5 % 5 (L) 4 (L) 6 (L)  Ferritin 24 - 336 ng/mL 11 (L) 7 (L) 20 (L)  .    Latest Ref Rng & Units 02/01/2024   11:18 AM 12/07/2023    8:13 AM 11/23/2023    9:56 AM  CMP  Glucose 70 - 99 mg/dL 96  881  899   BUN 6 - 20 mg/dL 9  10  10    Creatinine 0.61 - 1.24 mg/dL 9.29  9.26  9.26   Sodium 135 - 145 mmol/L 137  137  137   Potassium 3.5 - 5.1 mmol/L 4.1  3.7  4.4   Chloride 98 - 111 mmol/L 104  102  103   CO2 22 - 32 mmol/L 29  27  29    Calcium 8.9 - 10.3 mg/dL 9.2  9.4  9.7   Total Protein 6.5 - 8.1 g/dL 7.0  7.4  7.4   Total Bilirubin 0.0 - 1.2 mg/dL 0.4  0.4  0.5   Alkaline Phos 38 - 126 U/L 88  114  114   AST 15 - 41 U/L 11  14  11    ALT 0 - 44 U/L 9  14  8      01/22/2019 [facility]: Tumor Mutational Burden       01/22/2019 PD-L1 Immunohistochemistry Analysis     01/22/2019 Soft Tissue Needle Core Biopsy Surgical Pathology       RADIOGRAPHIC STUDIES: I have personally reviewed the radiological images as listed and agreed with the findings in the  report.  Imaging Results  CT CHEST ABDOMEN PELVIS W CONTRAST Result Date: 11/23/2023 CLINICAL DATA:  Low back pain, shoulder pain, evaluate for progression of lung cancer * Tracking Code: BO * EXAM: CT CHEST, ABDOMEN, AND PELVIS WITH CONTRAST TECHNIQUE: Multidetector CT imaging of the chest, abdomen and pelvis was performed following the standard protocol during bolus administration of  intravenous contrast. RADIATION DOSE REDUCTION: This exam was performed according to the departmental dose-optimization program which includes automated exposure control, adjustment of the mA and/or kV according to patient size and/or use of iterative reconstruction technique. CONTRAST:  OMNIPAQUE  IOHEXOL  300 MG/ML  SOLN COMPARISON:  08/15/2023 FINDINGS: CT CHEST FINDINGS Cardiovascular: Left chest port catheter. Normal heart size. No pericardial effusion. Mediastinum/Nodes: Unchanged treated pretracheal and right soft tissue. No discretely enlarged lymph nodes (series 2, image 25). Thyroid  gland, trachea, and esophagus demonstrate no significant findings. Lungs/Pleura: Unchanged post treatment appearance of the chest with perihilar fibrosis and consolidation of the right upper lobe with additional paramedian fibrosis in the left upper lobe (series 4, image 63). Mild underlying paraseptal and centrilobular emphysema. No pleural effusion or pneumothorax. Musculoskeletal: No chest wall abnormality. No acute osseous findings. CT ABDOMEN PELVIS FINDINGS Hepatobiliary: No solid liver abnormality is seen. No gallstones, gallbladder wall thickening, or biliary dilatation. Pancreas: Unremarkable. No pancreatic ductal dilatation or surrounding inflammatory changes. Spleen: Normal in size without significant abnormality. Adrenals/Urinary Tract: Unchanged benign right adrenal adenoma, previously characterized by MR (series 2, image 66). 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 Kidneys are normal, without renal calculi, solid lesion, or hydronephrosis. Bladder is unremarkable. Stomach/Bowel: Stomach is within normal limits. Appendix not clearly visualized. No evidence of bowel wall thickening, distention, or inflammatory changes. Vascular/Lymphatic: Scattered aortic atherosclerosis. No enlarged abdominal or pelvic lymph  nodes. Reproductive: Prostatomegaly. Other: No abdominal wall hernia or abnormality. No ascites. Musculoskeletal: No acute osseous findings. IMPRESSION: 1. No acute CT findings to explain back or shoulder pain. 2. Unchanged post treatment appearance of the chest with perihilar fibrosis and consolidation of the right upper lobe with additional paramedian fibrosis in the left upper lobe. 3. Unchanged treated pretracheal and right soft tissue. No discretely enlarged lymph nodes. 4. No evidence of lymphadenopathy or metastatic disease in the abdomen or pelvis. Aortic Atherosclerosis (ICD10-I70.0) and Emphysema (ICD10-J43.9). Electronically Signed   By: Marolyn JONETTA Jaksch M.D.   On: 11/23/2023 21:05        ASSESSMENT & PLAN: This is a 58 year old male with   1. Hx of Nicotine dependence -has quit since cancer diagnosis - Metastatic Poorly differentiated lung adenocarcinoma Presented with Large neck mass, mediastinal mass, renal mass, and questionable bone lesions  no brain mets on MRI brain 01/22/2019 PD-L1 Immunohistochemistry Analysis which revealed Tumor Proportion Score (TPS) 50% 05/16/2019 PET/CT ([id number]) revealed Radiation changes in the right hemithorax, as above. Improving mediastinal nodal metastases. Prior bulky right cervical metastases have resolved. No evidence of metastatic disease in the abdomen/pelvis. 07/19/2019 Esophagus Scan ([id number]) revealed Mild stricture at the C6-7 level likely related to prior esophageal surgery. Barium tablet was slow to pass through this area but did pass through after 1 minutes. Hiatal hernia with moderate gastroesophageal reflux. Moderate stricture above the hiatal hernia likely due to reflux. Barium tablet did not pass this area. There are changes of esophagitis which are likely due to reflux and possibly radiation as well.  10/10/19 of  CT Chest W Contrast ([id number])- no evidence of lung cancer progression  at this time. He did have a PET CT scan on  04/13/2021 which was reviewed on the previous visit by Johnston.  This showed some borderline FDG avid right paratracheal right hilar and azygoesophageal lymph nodes that were suspicious for possible nodal recurrence however the patient notes that he did have a bad upper respiratory tract infection around the time that he had his PET CT scan and therefore reactive adenopathy is a possibility as well.    2. 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   4. Symptomatic hemorrhoids-currently no significant bleeding but have caused some mild iron  deficiency anemia. Using Prep-H for management.    5. Normocytic anemia; Iron  deficiency   -Likely due to underlying malignancy + Iron  deficiency due to ongoing bleeding from hemorrhoids -labs improved, though still within anemic range   6. S/pThrombocytosis -- likely due to paraneoplastic effect of tumor and reactive due to inflammation and tissue inflammation from RT.-- now resolved.   7.  Erectile dysfunction-having partial benefits with using Viagra .   8.  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  PLAN:   - prescribing Boost to maintain nourishment, though encouraged patient to eat whole foods as much as tolerable - Consider seeing GI for esophageal stricture if he begins to have difficulty swallowing - Vaccine Counseling given today as he is due for several inoculations.   11/23/2023 CT chest/abdm/pelvis scan ordered by Neomi RIGGERS showed no findings of new cancer.  -Patient has no lab or clinical evidence of lung cancer recurrence/progression at this time.  - Will repeat imaging in 3 months to confirm   - Continue Eliquis  2.5 mg BID  - Continue Prep-H and monitoring stools for excessive bleeding   - Discussed lab  results on 02/01/2024 in detail with patient: CBC showed WBC 8.2 >> 6.5, hemoglobin 10.9 >> 10.8, platelets 500K >>276K, RBCs, MCV 77.6 >> 76.9. Iron  Panel improved with iron  16 >> 24 and ferritin 7 >> 20.  - Refilling Sildenafil  today per patient request  - continue medication regimens for continued management - Encouraged him to follow-up with Dr. Jeannetta.    FOLLOW UP: Hold atezolizumab  treatments at this time. CT CAP in 10 weeks RTC with Dr Onesimo with portflush and labs in 12 weeks Follow-up with Dr. Jeannetta from rheumatology for management of immunotherapy related inflammatory arthritis.  The total time spent in the appointment was 30 minutes*.  All of the patient's questions were answered with apparent satisfaction. The patient knows to call the clinic with any problems, questions or concerns.   Emaline Onesimo MD MS AAHIVMS [facility] Hematology/Oncology Physician [facility]  .*Total Encounter Time as defined by the Centers for Medicare and Medicaid Services includes, in addition to the face-to-face time of a patient visit (documented in the note above) non-face-to-face time: obtaining and reviewing outside history, ordering and reviewing medications, tests or procedures, care coordination (communications with other health care professionals or caregivers) and documentation in the medical record.   I,Emily Lagle,acting as a Neurosurgeon for Emaline Onesimo, MD.,have documented all relevant documentation on the behalf of Emaline Onesimo, MD,as directed by  Emaline Onesimo, MD while in the presence of Emaline Onesimo, MD.  .I have reviewed the above documentation for accuracy and completeness, and I agree with the above. .Renuka Farfan Kishore  Onesimo MD

## 2024-02-03 ENCOUNTER — Other Ambulatory Visit: Payer: Self-pay

## 2024-02-08 ENCOUNTER — Encounter: Payer: Self-pay | Admitting: Hematology

## 2024-02-08 ENCOUNTER — Encounter: Payer: Self-pay | Admitting: Physician Assistant

## 2024-02-20 ENCOUNTER — Other Ambulatory Visit: Payer: Self-pay | Admitting: Internal Medicine

## 2024-02-20 DIAGNOSIS — L509 Urticaria, unspecified: Secondary | ICD-10-CM

## 2024-02-20 NOTE — Telephone Encounter (Signed)
 Last Fill: 10/18/2023 & 10/19/2023  Next Visit: 03/02/2024  Last Visit: 01/03/2024  Dx: Inflammatory arthritis   Current Dose per office note on 01/03/2024: dose not mentioned  Okay to refill Zyrtec  and Pepcid  ?

## 2024-02-22 ENCOUNTER — Telehealth: Payer: Self-pay | Admitting: Internal Medicine

## 2024-02-22 ENCOUNTER — Other Ambulatory Visit: Payer: Self-pay | Admitting: Internal Medicine

## 2024-02-22 DIAGNOSIS — L509 Urticaria, unspecified: Secondary | ICD-10-CM

## 2024-02-22 NOTE — Telephone Encounter (Signed)
 Patient contacted the office to request a medication refill.   1. Name of Medication: cetirizine   2. How are you currently taking this medication (dosage and times per day)? 10 mg   3. What pharmacy would you like for that to be sent to? [facility]- [place]    Patient contacted the office to request a medication refill.   1. Name of Medication: Pepcid   2. How are you currently taking this medication (dosage and times per day)? 20 MG   3. What pharmacy would you like for that to be sent to? [facility]- [place]

## 2024-02-22 NOTE — Telephone Encounter (Signed)
See rx note for details.  °

## 2024-02-22 NOTE — Telephone Encounter (Signed)
 Contacted patient to advise that Dr. Jeannetta is just getting back in office and that I would mention it to get them signed.

## 2024-02-22 NOTE — Progress Notes (Signed)
 Office Visit Note  Patient: David Berry.             Date of Birth: 20-Apr-1966           MRN: [id number]             PCP: Haze Kingfisher, MD Referring: Haze Kingfisher, MD Visit Date: 03/02/2024   Subjective:  No chief complaint on file.  Discussed the use of AI scribe software for clinical note transcription with the patient, who gave verbal consent to proceed.  History of Present Illness   David Berry. is a 58 y.o. male here for follow up with inflammatory arthritis on HCQ 200 mg daily and prednisone  5 mg daily who presents with joint pain and swelling.   He received a corticosteroid injection in the right shoulder during his last visit, which provided relief. However, he continues to experience morning stiffness, particularly in the right shoulder, which improves by early afternoon. The stiffness is described as 'a little tight' and is worse on one side compared to the other. He manages the stiffness by staying active and moving around to prevent it from worsening.  No swelling in the joints is reported, but there is minor joint pain, particularly around the wrist and shoulder. As the day progresses and after taking prednisone , the patient reports that the stiffness eases up.  He experiences a rash occasionally in the morning, which is managed with Zyrtec . Missing a dose of Zyrtec  results in the rash reappearing.  He mentions a decrease in appetite and is trying to adjust his diet, including overcooking meats to make them more palatable. He wants to 'pick my weight back up' and is working on finding suitable meals.   Previous HPI 01/03/2024 David Berry. is a 58 year old male with inflammatory arthritis who presents with worsening joint pain and swelling.   He has experienced increased joint pain and swelling, particularly in his shoulders and feet, over the past month. The pain is severe enough to affect his ability to lift his arms overhead and  perform daily activities such as driving. He describes stiffness after sitting for a few minutes, requiring him to 'redo it again and get this thing working.'   He is currently taking prednisone  5 mg daily, which he has been using consistently. He recalls being on a higher dose temporarily but is now on a lower dose. He also takes ibuprofen  800 mg, which he obtains from his sister as easier to swallow than 4 regular strength tablets, to manage the pain, although he is concerned about potential stomach issues.   He notes swelling in both feet, with the right ankle being particularly swollen. His symptoms were worse about a month ago but have started to ease slightly. However, he still experiences significant pain and stiffness, especially in his shoulders, which he describes as 'frozen.'   He has a history of being on atezolizumab  immunotherapy, which was paused. He has been off the immunotherapy since July and is uncertain if his current symptoms are related to the immunotherapy or a pre-existing condition. He was resumed on low dose prednisone  after follow up with Dr. Onesimo.   Unfortunately previous discussion to see Dr. Jonah with [facility] who had an interest in immunotherapy related events fell through, as she left that practice.     Previous HPI 11/18/23 David Berry. is a 59 y.o. 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   He is currently undergoing immunotherapy with Tecentriq  for lung cancer. Previously, he was on prednisone , which effectively managed his joint pain, but its use has been reduced due to potential interactions with his cancer treatment. He experiences significant discomfort, noting that he was awake from 1 AM due to pain.   He experiences muscle pain primarily in his shoulders and neck, describing it as similar to post-exercise soreness. The pain worsens with movement, especially when raising his arms  overhead, and has been problematic for the past few days. The pain is diffuse across the shoulders and neck.   He is concerned about weight loss, observing 'skinny thighs' and feeling he is losing weight, questioning if he needs more protein. No swelling or visible changes in the affected areas.   He describes his back as 'crooked' and has a history of degenerative disc disease, although he does not believe this is contributing to his current shoulder and neck pain.         Previous HPI 08/12/2023 David Berry. 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   He experiences joint pain, particularly in his fingers, with a recent episode where left middle finger became significantly swollen and somewhat deviated to the side. He also has aching in his back and hips, though currently feels well. He has a history of degenerative disc disease with mild wear and tear in the lower back, and some bone spurring. He reports no significant issues with his hip joints.   His skin condition has improved with the use of double antihistamines, specifically Zyrtec  and Pepcid , taken once daily. He notes no new breakouts and states that the treatment is effective.   Labs reviewed 06/21/23 ESR 34 CRP 11.2 RF neg UPCR wnl dsDA 264 C3/C4 wnl   Previous HPI 06/21/23 David Berry. is a 58 year old male here for evaluation of rashes, joint pain, and abnormal blood test results with positive dsDNA. He was referred by Dr. Orlan who saw him on account of urticarial rashes, which did improve on subsequent treatment with high dose zyrtec  and pepcid .   He has been on Tecentriq  (PD-L1) for lung cancer since 2020, with no changes to this treatment in the last year.   Rashes have been present since January of last year, initially associated with starting a statin medication. The rashes appear as hives,  affecting his sides, back, legs, and shoulders, and are described as itchy but not painful. They typically last overnight, leaving redness the next morning. The rashes subsided while on Zyrtec  and Pepcid , but recurred after discontinuation of these medications.   Joint pain and swelling have been present since last year, coinciding with the onset of the rashes. He describes a tingling sensation in his fingers and swelling in his hands. No prior history of similar symptoms before last year. He has not been on any steroids recently, although he used them a few years ago for back problems.   He has experienced new onset headaches over the past few months, described as short and progressive.   He has a history of falling on his right side at the end of the year before last, which he associates with current hip pain. He has not had any x-rays for this issue. He describes the pain as arthritic, particularly when moving or getting up, and notes it is worse since stopping Zyrtec  and  Pepcid .   Labs reviewed 04/2023 dsDNA 243 RNP, Sm, SSA, SSB neg ESR 43   Review of Systems  Constitutional:  Negative for fatigue.  HENT:  Negative for mouth sores and mouth dryness.   Eyes:  Negative for dryness.  Respiratory:  Negative for shortness of breath.   Cardiovascular:  Negative for chest pain and palpitations.  Gastrointestinal:  Positive for blood in stool. Negative for constipation and diarrhea.  Endocrine: Negative for increased urination.  Genitourinary:  Negative for involuntary urination.  Musculoskeletal:  Positive for joint pain, joint pain, myalgias, morning stiffness, muscle tenderness and myalgias. Negative for gait problem, joint swelling and muscle weakness.  Skin:  Positive for rash. Negative for color change, hair loss and sensitivity to sunlight.  Allergic/Immunologic: Negative for susceptible to infections.  Neurological:  Negative for dizziness and headaches.  Hematological:  Negative for  swollen glands.  Psychiatric/Behavioral:  Negative for depressed mood and sleep disturbance. The patient is not nervous/anxious.     PMFS History:  Patient Active Problem List   Diagnosis Date Noted   Inflammatory arthritis 11/18/2023   High risk medication use 11/18/2023   Positive ANA (antinuclear antibody) 06/21/2023   Pain in right hip 06/21/2023   Intention tremor 04/21/2021   Mononeuropathy 04/21/2021   Chronic anticoagulation 02/28/2021   Right shoulder pain 02/28/2021   Sternoclavicular joint pain, right 02/28/2021   Encounter for immunotherapy 10/29/2020   Iron  deficiency anemia 10/29/2020   Gastroesophageal reflux disease 10/02/2020   Hypotension due to hypovolemia 10/02/2020   Port-A-Cath in place 06/13/2019   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/26/2019   Former smoker 02/26/2019   Adenocarcinoma of right lung (HCC) 02/05/2019   Counseling regarding advance care planning and goals of care 02/05/2019   Acute deep vein thrombosis (DVT) of non-extremity vein    Adenocarcinoma of lung (HCC)    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   Internal jugular (IJ) vein thromboembolism, acute, right (HCC) 01/24/2019   Mass of right side of neck, s/p Radiation therapy 01/24/2019   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   Right renal mass 01/24/2019   Hip mass, right 01/24/2019   Thyromegaly 01/24/2019   Extravasation injury of IV catheter site with other complication    BACK PAIN, LUMBAR 03/24/2010   LUMBAR RADICULOPATHY 03/24/2010    Past Medical History:  Diagnosis Date   Anxiety    Bipolar disorder (HCC)    Blood in stool    Cancer (HCC)    Chronic bronchitis with emphysema (HCC)    pt denies this   Eczema    GERD (gastroesophageal reflux disease)    Lung cancer (HCC) dx'd 01/2019   Peripheral vascular disease    blood clot in neck Sept 12, 2020    Family History  Problem Relation Age of Onset   Prostate cancer Father    Colon cancer Neg Hx    Stomach cancer Neg Hx    Esophageal  cancer Neg Hx    Past Surgical History:  Procedure Laterality Date   APPENDECTOMY     teenager   INGUINAL HERNIA REPAIR Right 2016, 2017   [facility], 2018 [facility]-removed mesh   IR IMAGING GUIDED PORT INSERTION  04/26/2019   TONSILLECTOMY     TOOTH EXTRACTION N/A 03/14/2020   Procedure: DENTAL RESTORATION/EXTRACTIONS;  Surgeon: Sheryle Hamilton, DDS;  Location: [facility] OR;  Service: Oral Surgery;  Laterality: N/A;   Social History   Social History Narrative   Lives alone   Caffeine- coffee, 20 oz daily, tea  occas    There is no immunization history on file for this patient.   Objective: Vital Signs: BP (!) 89/61   Pulse 69   Temp 97.8 F (36.6 C)   Resp 16   Ht 5' 10 (1.778 m)   Wt 127 lb 9.6 oz (57.9 kg)   BMI 18.31 kg/m    Physical Exam Eyes:     Conjunctiva/sclera: Conjunctivae normal.  Cardiovascular:     Rate and Rhythm: Normal rate and regular rhythm.  Pulmonary:     Effort: Pulmonary effort is normal.     Breath sounds: Normal breath sounds.  Skin:    General: Skin is warm and dry.  Neurological:     Mental Status: He is alert.  Psychiatric:        Mood and Affect: Mood normal.      Musculoskeletal Exam:  Left shoulder painful with active abduction but tolerates full range of motion Wrists with bilateral swelling, slight tenderness to pressure, full range of motion Fingers full ROM no tenderness or swelling Low back midline and paraspinal muscle tenderness to pressure Some pain and stiffness with hip flexion especially resisted movement Knees full ROM no tenderness or swelling Slight right ankle swelling and pain with movement   Investigation: No additional findings.  Imaging: No results found.  Recent Labs: Lab Results  Component Value Date   WBC 6.5 02/01/2024   HGB 10.8 (L) 02/01/2024   PLT 276 02/01/2024   NA 137 02/01/2024   K 4.1 02/01/2024   CL 104 02/01/2024   CO2 29 02/01/2024   GLUCOSE 96 02/01/2024   BUN 9 02/01/2024    CREATININE 0.70 02/01/2024   BILITOT 0.4 02/01/2024   ALKPHOS 88 02/01/2024   AST 11 (L) 02/01/2024   ALT 9 02/01/2024   PROT 7.0 02/01/2024   ALBUMIN 4.1 02/01/2024   CALCIUM 9.2 02/01/2024   GFRAA >60 01/30/2020    Speciality Comments: PLQ Eye Exam normal 02/21/2024 [facility] f/u 12 months  Procedures:  No procedures performed Allergies: Atorvastatin calcium   Assessment / Plan:     Visit Diagnoses: Inflammatory arthritis - Plan: hydroxychloroquine  (PLAQUENIL ) 200 MG tablet Chronic inflammatory arthritis with right shoulder involvement. Symptoms improved post-corticosteroid injection. Morning stiffness persists but improves with activity and prednisone . No significant joint swelling. Managed with hydroxychloroquine  and prednisone . - Prescribe 90-day refill of hydroxychloroquine  and prednisone . - Advise continuation of current medication regimen. - Encourage regular physical activity to manage stiffness.  Right shoulder pain, unspecified chronicity Improved symptoms after steroid injection.  Bilateral hand swelling Positive ANA (antinuclear antibody) - Plan: predniSONE  (DELTASONE ) 5 MG tablet Abnormal immunological findings post-immunotherapy. Symptoms improved with prednisone . Antibodies from immunotherapy can persist for up to six months. - Monitor immunological status and symptoms. - Evaluate need for continued prednisone  use at next follow-up.     Urticaria - Plan: cetirizine  (ZYRTEC ) 10 MG tablet, famotidine  (PEPCID ) 20 MG tablet  Orders: No orders of the defined types were placed in this encounter.  Meds ordered this encounter  Medications   predniSONE  (DELTASONE ) 5 MG tablet    Sig: Take 1 tablet (5 mg total) by mouth daily with breakfast.    Dispense:  90 tablet    Refill:  0   hydroxychloroquine  (PLAQUENIL ) 200 MG tablet    Sig: Take 1 tablet (200 mg total) by mouth daily.    Dispense:  90 tablet    Refill:  0   cetirizine  (ZYRTEC ) 10 MG tablet     Sig:  Take 1 tablet (10 mg total) by mouth 2 (two) times daily.    Dispense:  180 tablet    Refill:  0   famotidine  (PEPCID ) 20 MG tablet    Sig: Take 1 tablet (20 mg total) by mouth 2 (two) times daily.    Dispense:  180 tablet    Refill:  0     Follow-Up Instructions: Return in about 3 months (around 06/02/2024) for irae arthritis on HCQ/GC f/u 3mos.   Lonni LELON Ester, MD  Note - This record has been created using [employer].  Chart creation errors have been sought, but may not always  have been located. Such creation errors do not reflect on  the standard of medical care.

## 2024-02-22 NOTE — Telephone Encounter (Signed)
 Patient called again wanting to know why Dr. Jeannetta has not approved to refill his medication. Pt stated he is all out of medication. Pt stated if he does not have it then he will get hives.

## 2024-02-24 ENCOUNTER — Encounter: Payer: Self-pay | Admitting: Physician Assistant

## 2024-02-24 ENCOUNTER — Encounter: Payer: Self-pay | Admitting: Hematology

## 2024-02-28 ENCOUNTER — Other Ambulatory Visit: Payer: Self-pay

## 2024-03-02 ENCOUNTER — Encounter: Payer: Self-pay | Admitting: Internal Medicine

## 2024-03-02 ENCOUNTER — Ambulatory Visit: Attending: Internal Medicine | Admitting: Internal Medicine

## 2024-03-02 VITALS — BP 89/61 | HR 69 | Temp 97.8°F | Resp 16 | Ht 70.0 in | Wt 127.6 lb

## 2024-03-02 DIAGNOSIS — R7689 Other specified abnormal immunological findings in serum: Secondary | ICD-10-CM

## 2024-03-02 DIAGNOSIS — M25511 Pain in right shoulder: Secondary | ICD-10-CM

## 2024-03-02 DIAGNOSIS — M1380 Other specified arthritis, unspecified site: Secondary | ICD-10-CM | POA: Diagnosis not present

## 2024-03-02 DIAGNOSIS — M7989 Other specified soft tissue disorders: Secondary | ICD-10-CM

## 2024-03-02 DIAGNOSIS — L509 Urticaria, unspecified: Secondary | ICD-10-CM

## 2024-03-02 MED ORDER — FAMOTIDINE 20 MG PO TABS: 20.0000 mg | ORAL_TABLET | Freq: Two times a day (BID) | ORAL | 0 refills | Status: AC

## 2024-03-02 MED ORDER — HYDROXYCHLOROQUINE SULFATE 200 MG PO TABS: 200.0000 mg | ORAL_TABLET | Freq: Every day | ORAL | 0 refills | Status: DC

## 2024-03-02 MED ORDER — CETIRIZINE HCL 10 MG PO TABS
10.0000 mg | ORAL_TABLET | Freq: Two times a day (BID) | ORAL | 0 refills | Status: AC
Start: 2024-03-02 — End: ?

## 2024-03-02 MED ORDER — PREDNISONE 5 MG PO TABS: 5.0000 mg | ORAL_TABLET | Freq: Every day | ORAL | 0 refills | Status: AC

## 2024-03-04 ENCOUNTER — Other Ambulatory Visit: Payer: Self-pay

## 2024-03-09 ENCOUNTER — Encounter: Payer: Self-pay | Admitting: Physician Assistant

## 2024-03-09 ENCOUNTER — Encounter: Payer: Self-pay | Admitting: Hematology

## 2024-03-14 ENCOUNTER — Other Ambulatory Visit: Payer: Self-pay | Admitting: Internal Medicine

## 2024-03-20 ENCOUNTER — Ambulatory Visit (HOSPITAL_COMMUNITY)
Admission: RE | Admit: 2024-03-20 | Discharge: 2024-03-20 | Disposition: A | Discharge status: Home / Self Care | Source: Ambulatory Visit | Attending: Hematology | Admitting: Hematology

## 2024-03-20 ENCOUNTER — Encounter (HOSPITAL_COMMUNITY): Payer: Self-pay

## 2024-03-20 DIAGNOSIS — C3491 Malignant neoplasm of unspecified part of right bronchus or lung: Secondary | ICD-10-CM | POA: Diagnosis present

## 2024-03-20 DIAGNOSIS — N2889 Other specified disorders of kidney and ureter: Secondary | ICD-10-CM | POA: Insufficient documentation

## 2024-03-20 LAB — POCT I-STAT CREATININE: Creatinine, Ser: 0.8 mg/dL (ref 0.61–1.24)

## 2024-03-20 MED ORDER — HEPARIN SOD (PORK) LOCK FLUSH 100 UNIT/ML IV SOLN
INTRAVENOUS | Status: AC
  Filled 2024-03-20: qty 5

## 2024-03-20 MED ORDER — HEPARIN SOD (PORK) LOCK FLUSH 100 UNIT/ML IV SOLN
500.0000 [IU] | Freq: Once | INTRAVENOUS | Status: AC
  Administered 2024-03-20: 500 [IU] via INTRAVENOUS

## 2024-03-20 MED ORDER — IOHEXOL 300 MG/ML  SOLN
100.0000 mL | Freq: Once | INTRAMUSCULAR | Status: AC | PRN
  Administered 2024-03-20: 100 mL via INTRAVENOUS

## 2024-03-20 MED ORDER — SODIUM CHLORIDE (PF) 0.9 % IJ SOLN
INTRAMUSCULAR | Status: AC
  Filled 2024-03-20: qty 50

## 2024-03-26 ENCOUNTER — Other Ambulatory Visit: Payer: Self-pay | Admitting: Internal Medicine

## 2024-03-26 DIAGNOSIS — M1380 Other specified arthritis, unspecified site: Secondary | ICD-10-CM

## 2024-04-11 ENCOUNTER — Other Ambulatory Visit: Payer: Self-pay | Admitting: *Deleted

## 2024-04-11 ENCOUNTER — Other Ambulatory Visit: Payer: Self-pay | Admitting: Internal Medicine

## 2024-04-11 DIAGNOSIS — L509 Urticaria, unspecified: Secondary | ICD-10-CM

## 2024-04-11 DIAGNOSIS — M1380 Other specified arthritis, unspecified site: Secondary | ICD-10-CM

## 2024-04-11 NOTE — Telephone Encounter (Signed)
 Last Fill: 03/02/2024 (patient states he did not receive)  Eye exam: normal 02/21/2024    Labs: 02/01/2024 Hgb 10.8, Hct 35.3, MCV 76.9, MCH 23.5, AST 11, Anion gap 4  Next Visit: 06/07/2024  Last Visit: 03/02/2024  IK:Pwqojffjunmb arthritis   Current Dose per office note 03/02/2024:  hydroxychloroquine  (PLAQUENIL ) 200 MG tablet       Sig: Take 1 tablet (200 mg total) by mouth daily.    Okay to refill Plaquenil ?

## 2024-04-12 ENCOUNTER — Other Ambulatory Visit: Payer: Self-pay | Admitting: Internal Medicine

## 2024-04-12 DIAGNOSIS — M1380 Other specified arthritis, unspecified site: Secondary | ICD-10-CM

## 2024-04-12 MED ORDER — HYDROXYCHLOROQUINE SULFATE 200 MG PO TABS: 200.0000 mg | ORAL_TABLET | Freq: Every day | ORAL | 0 refills | Status: AC

## 2024-04-25 ENCOUNTER — Inpatient Hospital Stay: Attending: Physician Assistant

## 2024-04-25 ENCOUNTER — Inpatient Hospital Stay

## 2024-04-25 ENCOUNTER — Telehealth: Payer: Self-pay | Admitting: Hematology

## 2024-04-25 ENCOUNTER — Inpatient Hospital Stay (HOSPITAL_BASED_OUTPATIENT_CLINIC_OR_DEPARTMENT_OTHER): Admitting: Hematology

## 2024-04-25 VITALS — BP 110/76 | HR 69 | Temp 98.6°F | Resp 17 | Wt 130.6 lb

## 2024-04-25 DIAGNOSIS — D509 Iron deficiency anemia, unspecified: Secondary | ICD-10-CM | POA: Diagnosis not present

## 2024-04-25 DIAGNOSIS — Z7901 Long term (current) use of anticoagulants: Secondary | ICD-10-CM | POA: Diagnosis not present

## 2024-04-25 DIAGNOSIS — Z8042 Family history of malignant neoplasm of prostate: Secondary | ICD-10-CM | POA: Insufficient documentation

## 2024-04-25 DIAGNOSIS — Z7189 Other specified counseling: Secondary | ICD-10-CM

## 2024-04-25 DIAGNOSIS — L501 Idiopathic urticaria: Secondary | ICD-10-CM | POA: Insufficient documentation

## 2024-04-25 DIAGNOSIS — Z87891 Personal history of nicotine dependence: Secondary | ICD-10-CM | POA: Diagnosis not present

## 2024-04-25 DIAGNOSIS — C771 Secondary and unspecified malignant neoplasm of intrathoracic lymph nodes: Secondary | ICD-10-CM | POA: Diagnosis present

## 2024-04-25 DIAGNOSIS — M1380 Other specified arthritis, unspecified site: Secondary | ICD-10-CM | POA: Insufficient documentation

## 2024-04-25 DIAGNOSIS — Z79899 Other long term (current) drug therapy: Secondary | ICD-10-CM | POA: Diagnosis not present

## 2024-04-25 DIAGNOSIS — N529 Male erectile dysfunction, unspecified: Secondary | ICD-10-CM | POA: Insufficient documentation

## 2024-04-25 DIAGNOSIS — Z86718 Personal history of other venous thrombosis and embolism: Secondary | ICD-10-CM | POA: Diagnosis not present

## 2024-04-25 DIAGNOSIS — C3491 Malignant neoplasm of unspecified part of right bronchus or lung: Secondary | ICD-10-CM | POA: Insufficient documentation

## 2024-04-25 DIAGNOSIS — Z7952 Long term (current) use of systemic steroids: Secondary | ICD-10-CM | POA: Diagnosis not present

## 2024-04-25 LAB — CMP (CANCER CENTER ONLY)
ALT: 16 U/L (ref 0–44)
AST: 19 U/L (ref 15–41)
Albumin: 4.4 g/dL (ref 3.5–5.0)
Alkaline Phosphatase: 99 U/L (ref 38–126)
Anion gap: 10 (ref 5–15)
BUN: 8 mg/dL (ref 6–20)
CO2: 28 mmol/L (ref 22–32)
Calcium: 9.8 mg/dL (ref 8.9–10.3)
Chloride: 103 mmol/L (ref 98–111)
Creatinine: 0.94 mg/dL (ref 0.61–1.24)
GFR, Estimated: >60 mL/min (ref >60)
Glucose, Bld: 104 mg/dL — ABNORMAL HIGH (ref 70–99)
Potassium: 4 mmol/L (ref 3.5–5.1)
Sodium: 140 mmol/L (ref 135–145)
Total Bilirubin: 0.2 mg/dL (ref 0.0–1.2)
Total Protein: 7.2 g/dL (ref 6.5–8.1)

## 2024-04-25 LAB — CBC WITH DIFFERENTIAL (CANCER CENTER ONLY)
Abs Immature Granulocytes: 0.01 K/uL (ref 0.00–0.07)
Basophils Absolute: 0.1 K/uL (ref 0.0–0.1)
Basophils Relative: 1 %
Eosinophils Absolute: 0.1 K/uL (ref 0.0–0.5)
Eosinophils Relative: 1 %
HCT: 36.5 % — ABNORMAL LOW (ref 39.0–52.0)
Hemoglobin: 11 g/dL — ABNORMAL LOW (ref 13.0–17.0)
Immature Granulocytes: 0 %
Lymphocytes Relative: 26 %
Lymphs Abs: 1.2 K/uL (ref 0.7–4.0)
MCH: 23.1 pg — ABNORMAL LOW (ref 26.0–34.0)
MCHC: 30.1 g/dL (ref 30.0–36.0)
MCV: 76.5 fL — ABNORMAL LOW (ref 80.0–100.0)
Monocytes Absolute: 0.7 K/uL (ref 0.1–1.0)
Monocytes Relative: 16 %
Neutro Abs: 2.5 K/uL (ref 1.7–7.7)
Neutrophils Relative %: 56 %
Platelet Count: 356 K/uL (ref 150–400)
RBC: 4.77 MIL/uL (ref 4.22–5.81)
RDW: 15.7 % — ABNORMAL HIGH (ref 11.5–15.5)
Smear Review: NORMAL
WBC Count: 4.5 K/uL (ref 4.0–10.5)
nRBC: 0 % (ref 0.0–0.2)

## 2024-04-25 LAB — IRON AND IRON BINDING CAPACITY (CC-WL,HP ONLY)
Iron: 18 ug/dL — ABNORMAL LOW (ref 45–182)
Saturation Ratios: 4 % — ABNORMAL LOW (ref 17.9–39.5)
TIBC: 410 ug/dL (ref 250–450)
UIBC: 392 ug/dL

## 2024-04-25 LAB — TSH: TSH: 2.04 u[IU]/mL (ref 0.350–4.500)

## 2024-04-25 LAB — FERRITIN: Ferritin: 16 ng/mL — ABNORMAL LOW (ref 24–336)

## 2024-04-25 NOTE — Progress Notes (Shared)
 HEMATOLOGY ONCOLOGY PROGRESS NOTE  Date of service: 04/25/2024  Patient Care Team: Haze Kingfisher, MD as PCP - General (Family Medicine)  CHIEF COMPLAINT/PURPOSE OF CONSULTATION: Follow-up for continued evaluation and management of metastatic lung adenocarcinoma   HISTORY OF PRESENTING ILLNESS: Please see previous notes for details on initial presentation.    SUMMARY OF ONCOLOGIC HISTORY: Oncology History  Adenocarcinoma of right lung (HCC)  02/05/2019 Initial Diagnosis   Adenocarcinoma of right lung (HCC)   02/12/2019 - 02/26/2019 Chemotherapy   The patient had dexamethasone  (DECADRON ) 4 MG tablet, 8 mg, Oral, Daily, 1 of 1 cycle, Start date: 02/07/2019, End date: 03/12/2019 palonosetron  (ALOXI ) injection 0.25 mg, 0.25 mg, Intravenous,  Once, 3 of 4 cycles Administration: 0.25 mg (02/12/2019), 0.25 mg (02/19/2019), 0.25 mg (02/26/2019) CARBOplatin  (PARAPLATIN ) 220 mg in sodium chloride  0.9 % 250 mL chemo infusion, 220 mg (96.3 % of original dose 226.6 mg), Intravenous,  Once, 3 of 4 cycles Dose modification:   (original dose 226.6 mg, Cycle 1) Administration: 220 mg (02/12/2019), 220 mg (02/26/2019) PACLitaxel  (TAXOL ) 78 mg in sodium chloride  0.9 % 250 mL chemo infusion (</= 80mg /m2), 45 mg/m2 = 78 mg, Intravenous,  Once, 3 of 4 cycles Administration: 78 mg (02/12/2019), 78 mg (02/26/2019)  for chemotherapy treatment.    03/21/2019 - 01/20/2022 Chemotherapy   Patient is on Treatment Plan : LUNG Atezolizumab   q21d Maintenance     02/10/2022 -  Chemotherapy   Patient is on Treatment Plan : LUNG NSCLC Atezolizumab  q21d       INTERVAL HISTORY: David Berry. is a 58 y.o. male who is here today for continued evaluation and management of metastatic lung adenocarcinoma . {ELcompanions/ambulations (Optional):33762}  he was last seen by me on 02/01/2024; at the time he mentioned experiencing knee and joint pain.  Today, he notes that he is not experiencing any new symptoms.  He  states that his weight is slowly increasing. He has gained 3 pounds since his last weigh in.   He mentions some joint inflammation. He does experience some black stools due to his iron  pills.   Denies skin rashes, leg swelling, bumps/lumps, infection, and abdominal pain.   REVIEW OF SYSTEMS:   10 Point review of systems of done and is negative except as noted above.  MEDICAL HISTORY Past Medical History:  Diagnosis Date   Anxiety    Bipolar disorder (HCC)    Blood in stool    Cancer (HCC)    Chronic bronchitis with emphysema (HCC)    pt denies this   Eczema    GERD (gastroesophageal reflux disease)    Lung cancer (HCC) dx'd 01/2019   Peripheral vascular disease    blood clot in neck Sept 12, 2020    SURGICAL HISTORY Past Surgical History:  Procedure Laterality Date   APPENDECTOMY     teenager   INGUINAL HERNIA REPAIR Right 2016, 2017   [facility], 2018 [facility]-removed mesh   IR IMAGING GUIDED PORT INSERTION  04/26/2019   TONSILLECTOMY     TOOTH EXTRACTION N/A 03/14/2020   Procedure: DENTAL RESTORATION/EXTRACTIONS;  Surgeon: Sheryle Hamilton, DDS;  Location: [facility] OR;  Service: Oral Surgery;  Laterality: N/A;    SOCIAL HISTORY Social History[1]  Social History   Social History Narrative   Lives alone   Caffeine- coffee, 20 oz daily, tea occas    SOCIAL DRIVERS OF HEALTH SDOH Screenings   Depression (PHQ2-9): Low Risk (12/07/2023)  Social Connections: Unknown (03/01/2022)   Received from [facility]  Tobacco Use: Medium Risk (03/02/2024)     FAMILY HISTORY Family History  Problem Relation Age of Onset   Prostate cancer Father    Colon cancer Neg Hx    Stomach cancer Neg Hx    Esophageal cancer Neg Hx      ALLERGIES: is allergic to atorvastatin calcium.  MEDICATIONS  Current Outpatient Medications  Medication Sig Dispense Refill   cetirizine  (ZYRTEC ) 10 MG tablet Take 1 tablet (10 mg total) by mouth 2 (two) times daily. 180 tablet 0    Cyanocobalamin  (VITAMIN B 12 PO) Take 2,500 mcg by mouth daily.     ELIQUIS  2.5 MG TABS tablet TAKE 1 TABLET(2.5 MG) BY MOUTH TWICE DAILY 60 tablet 5   esomeprazole  (NEXIUM ) 40 MG capsule TAKE 1 CAPSULE(40 MG) BY MOUTH DAILY 90 capsule 2   famotidine  (PEPCID ) 20 MG tablet Take 1 tablet (20 mg total) by mouth 2 (two) times daily. 180 tablet 0   feeding supplement (BOOST HIGH PROTEIN) LIQD Take 237 mLs by mouth 3 (three) times daily between meals. 63990 mL 1   hydroxychloroquine  (PLAQUENIL ) 200 MG tablet Take 1 tablet (200 mg total) by mouth daily. 90 tablet 0   ibuprofen  (ADVIL ) 800 MG tablet Take 800 mg by mouth every 8 (eight) hours as needed. (Patient not taking: Reported on 03/02/2024)     lidocaine -prilocaine  (EMLA ) cream Apply 1 Application topically as needed. 30 g 0   ondansetron  (ZOFRAN ) 4 MG tablet Take 1 tablet (4 mg total) by mouth every 8 (eight) hours as needed for nausea or vomiting. 30 tablet 1   predniSONE  (DELTASONE ) 5 MG tablet Take 1 tablet (5 mg total) by mouth daily with breakfast. 90 tablet 0   propranolol  (INDERAL ) 20 MG tablet TAKE 1 TABLET(20 MG) BY MOUTH TWICE DAILY 60 tablet 5   sildenafil  (VIAGRA ) 50 MG tablet Take 1 tablet (50 mg total) by mouth daily as needed for erectile dysfunction. Take 30 mins to 4 hours prior to sexual activity 30 tablet 1   No current facility-administered medications for this visit.   Facility-Administered Medications Ordered in Other Visits  Medication Dose Route Frequency Provider Last Rate Last Admin   sodium chloride  flush (NS) 0.9 % injection 10 mL  10 mL Intracatheter PRN David Emaline Brink, MD   10 mL at 05/18/23 1559    PHYSICAL EXAMINATION: ECOG PERFORMANCE STATUS: 0 - Asymptomatic VITALS: Vitals:   04/25/24 0914  BP: 110/76  Pulse: 69  Resp: 17  Temp: 98.6 F (37 C)  SpO2: 99%   Filed Weights   04/25/24 0914  Weight: 130 lb 9.6 oz (59.2 kg)   Body mass index is 18.74 kg/m.  GENERAL: alert, in no acute distress  and comfortable SKIN: no acute rashes, no significant lesions EYES: conjunctiva are pink and non-injected, sclera anicteric OROPHARYNX: MMM, no exudates, no oropharyngeal erythema or ulceration NECK: supple, no JVD LYMPH:  no palpable lymphadenopathy in the cervical, axillary or inguinal regions LUNGS: clear to auscultation b/l with normal respiratory effort HEART: regular rate & rhythm ABDOMEN:  normoactive bowel sounds , non tender, not distended, no hepatosplenomegaly Extremity: no pedal edema PSYCH: alert & oriented x 3 with fluent speech NEURO: no focal motor/sensory deficits  LABORATORY DATA:   I have reviewed the data as listed     Latest Ref Rng & Units 04/25/2024    8:54 AM 02/01/2024   11:18 AM 12/07/2023    8:13 AM  CBC EXTENDED  WBC 4.0 - 10.5 K/uL 4.5  6.5  8.2   RBC 4.22 - 5.81 MIL/uL 4.77  4.59  4.42   Hemoglobin 13.0 - 17.0 g/dL 88.9  89.1  89.0   HCT 39.0 - 52.0 % 36.5  35.3  34.3   Platelets 150 - 400 K/uL 356  276  500   NEUT# 1.7 - 7.7 K/uL PENDING  5.1  6.2   Lymph# 0.7 - 4.0 K/uL PENDING  0.7  0.9         Latest Ref Rng & Units 03/20/2024   11:25 AM 02/01/2024   11:18 AM 12/07/2023    8:13 AM  CMP  Glucose 70 - 99 mg/dL  96  881   BUN 6 - 20 mg/dL  9  10   Creatinine 9.38 - 1.24 mg/dL 9.19  9.29  9.26   Sodium 135 - 145 mmol/L  137  137   Potassium 3.5 - 5.1 mmol/L  4.1  3.7   Chloride 98 - 111 mmol/L  104  102   CO2 22 - 32 mmol/L  29  27   Calcium 8.9 - 10.3 mg/dL  9.2  9.4   Total Protein 6.5 - 8.1 g/dL  7.0  7.4   Total Bilirubin 0.0 - 1.2 mg/dL  0.4  0.4   Alkaline Phos 38 - 126 U/L  88  114   AST 15 - 41 U/L  11  14   ALT 0 - 44 U/L  9  14    Iron  and Iron  Binding Capacity: Component     Latest Ref Rng 04/25/2024  Iron      45 - 182 ug/dL 18 (L)   TIBC     749 - 450 ug/dL 589   Saturation Ratios     17.9 - 39.5 % 4 (L)   UIBC     ug/dL 607     Legend: (L) Low  01/22/2019 Foundation One: Tumor Mutational Burden        01/22/2019 PD-L1 Immunohistochemistry Analysis     01/22/2019 Soft Tissue Needle Core Biopsy Surgical Pathology        RADIOGRAPHIC STUDIES: I have personally reviewed the radiological images as listed and agreed with the findings in the report. CT CHEST ABDOMEN PELVIS W CONTRAST Result Date: 03/21/2024 EXAM: CT CHEST, ABDOMEN AND PELVIS WITH CONTRAST 03/20/2024 11:32:41 AM TECHNIQUE: CT of the chest, abdomen and pelvis was performed with the administration of 100 mL of iohexol  (OMNIPAQUE ) 300 MG/ML solution. Multiplanar reformatted images are provided for review. Automated exposure control, iterative reconstruction, and/or weight based adjustment of the mA/kV was utilized to reduce the radiation dose to as low as reasonably achievable. COMPARISON: CT Chest Abdomen Pelvis with contrast 11/23/2023. CLINICAL HISTORY: Non-small cell lung cancer (NSCLC), metastatic, assess treatment response; Evaluation of response to treatment of metastatic lung adenocarcinoma. * Tracking Code: BO * FINDINGS: CHEST: MEDIASTINUM AND LYMPH NODES: Heart and pericardium are unremarkable. The central airways are clear. Ill defined tissue planes of the mediastinum related to radiation therapy. No pathologically enlarged lymph nodes. No change from prior. No mediastinal, hilar or axillary lymphadenopathy. LUNGS AND PLEURA: Perihilar linear consolidation with bronchiectasis about the right hilum has not changed from comparison to the exam and consists with post radiation change. Similar findings in the left upper lobe to a lesser degree. No new pulmonary nodules within the lungs. No focal consolidation or pulmonary edema. No pleural effusion or pneumothorax. ABDOMEN AND PELVIS: LIVER: The liver is unremarkable. No liver metastases. GALLBLADDER AND BILE DUCTS: Gallbladder is  unremarkable. No biliary ductal dilatation. SPLEEN: No acute abnormality. PANCREAS: No acute abnormality. ADRENAL GLANDS: Abnormal adrenal glands. KIDNEYS,  URETERS AND BLADDER: 15 mm enhancing lesion in the cortex of the right kidney as previously described is benign angiomyolipoma. No stones in the kidneys or ureters. No hydronephrosis. No perinephric or periureteral stranding. Urinary bladder is unremarkable. GI AND BOWEL: Stomach demonstrates no acute abnormality. There is no bowel obstruction. REPRODUCTIVE ORGANS: No acute abnormality. PERITONEUM AND RETROPERITONEUM: No ascites. No free air. VASCULATURE: Aorta is normal in caliber. ABDOMINAL AND PELVIS LYMPH NODES: No lymphadenopathy. BONES AND SOFT TISSUES: Left chest wall port. Densely sclerotic lesion in the left iliac bone measuring 9 mm is unchanged from prior and most consistent with a benign enostosis. No skeletal metastases. No acute osseous abnormality. No focal soft tissue abnormality. IMPRESSION: 1. Stable post-radiation change centered at the right hilum. 2. No evidence of lung cancer or recurrent or metastasis. 3. 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 MRI 04/19/2023. No further workup required. Electronically signed by: Norleen Boxer MD 03/21/2024 10:34 AM EST RP Workstation: [id number]    ASSESSMENT & PLAN:  58 y.o. male with  1. Hx of Nicotine dependence -has quit since cancer diagnosis - Metastatic Poorly differentiated lung adenocarcinoma Presented with Large neck mass, mediastinal mass, renal mass, and questionable bone lesions  no brain mets on MRI brain 01/22/2019 PD-L1 Immunohistochemistry Analysis which revealed Tumor Proportion Score (TPS) 50% 05/16/2019 PET/CT ([id number]) revealed Radiation changes in the right hemithorax, as above. Improving mediastinal nodal metastases. Prior bulky right cervical metastases have resolved. No evidence of metastatic disease in the abdomen/pelvis. 07/19/2019 Esophagus Scan ([id number]) revealed Mild stricture at the C6-7 level likely related to prior esophageal surgery. Barium  tablet was slow to pass through this area but did pass through after 1 minutes. Hiatal hernia with moderate gastroesophageal reflux. Moderate stricture above the hiatal hernia likely due to reflux. Barium tablet did not pass this area. There are changes of esophagitis which are likely due to reflux and possibly radiation as well.  10/10/19 of  CT Chest W Contrast ([id number])- no evidence of lung cancer progression at this time. He did have a PET CT scan on 04/13/2021 which was reviewed on the previous visit by Johnston.  This showed some borderline FDG avid right paratracheal right hilar and azygoesophageal lymph nodes that were suspicious for possible nodal recurrence however the patient notes that he did have a bad upper respiratory tract infection around the time that he had his PET CT scan and therefore reactive adenopathy is a possibility as well.    2. 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   4. Symptomatic hemorrhoids-currently no significant bleeding but have caused some mild iron  deficiency anemia. Using Prep-H for management.    5. Normocytic anemia; Iron  deficiency   -Likely due to underlying malignancy + Iron  deficiency due to ongoing bleeding from hemorrhoids -labs improved, though still within anemic range   6. S/pThrombocytosis -- likely due to paraneoplastic effect of tumor and reactive due to inflammation and tissue inflammation from RT.-- now resolved.   7.  Erectile dysfunction-having partial benefits with using Viagra .   8.  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  PLAN: - Discussed lab results on 04/25/2024 in  detail with patient: - CMP  - Creatinine :  0.94 - Calcium: 9.8 - CBC  - Hemoglobin: 11  - WBC: 4.5  - Platelets: 356 -  Iron  Sat : 4 - TSH pending - Ferritin pending - Discussed next step being to wean off the Prednisone  medication - RTC with Dr David with portflush   FOLLOW-UP in 2 months for labs and follow-up with Dr. Onesimo.  The total time spent in the appointment was *** minutes* .  All of the patient's questions were answered and the patient knows to call the clinic with any problems, questions, or concerns.  Emaline Onesimo MD MS AAHIVMS [facility] Hematology/Oncology Physician [facility]  *Total Encounter Time as defined by the Centers for Medicare and Medicaid Services includes, in addition to the face-to-face time of a patient visit (documented in the note above) non-face-to-face time: obtaining and reviewing outside history, ordering and reviewing medications, tests or procedures, care coordination (communications with other health care professionals or caregivers) and documentation in the medical record.  I, Marijo Sharps, acting as a neurosurgeon for Emaline Onesimo, MD.,have documented all relevant documentation on the behalf of Emaline Onesimo, MD,as directed by  Emaline Onesimo, MD while in the presence of Emaline Onesimo, MD.  I have reviewed the above documentation for accuracy and completeness, and I agree with the above.  Emaline Onesimo, MD     [1]  Social History Tobacco Use   Smoking status: Former    Types: Cigars    Quit date: 01/20/2019    Years since quitting: 5.2    Passive exposure: Current   Smokeless tobacco: Never   Tobacco comments:    smokes 3 black and mild cigars per day  Vaping Use   Vaping status: Never Used  Substance Use Topics   Alcohol use: Not Currently   Drug use: Yes    Types: Marijuana

## 2024-04-25 NOTE — Telephone Encounter (Signed)
 Scheduled patient for next appointments. Called and spoke with the patient, he is aware.

## 2024-04-27 ENCOUNTER — Other Ambulatory Visit: Payer: Self-pay

## 2024-05-01 ENCOUNTER — Encounter: Payer: Self-pay | Admitting: Physician Assistant

## 2024-05-01 ENCOUNTER — Encounter: Payer: Self-pay | Admitting: Hematology

## 2024-05-19 ENCOUNTER — Encounter: Payer: Self-pay | Admitting: Hematology

## 2024-05-19 ENCOUNTER — Encounter: Payer: Self-pay | Admitting: Physician Assistant

## 2024-05-24 NOTE — Assessment & Plan Note (Signed)
 SABRA

## 2024-05-24 NOTE — Progress Notes (Unsigned)
 "  Office Visit Note  Patient: David Berry.             Date of Birth: 15-Mar-1966           MRN: [id number]             PCP: Haze Kingfisher, MD Referring: Haze Kingfisher, MD Visit Date: 06/07/2024   Subjective:  No chief complaint on file.   History of Present Illness: David Berry. is a 59 y.o. male here for follow up with inflammatory arthritis on HCQ 200 mg daily and prednisone  5 mg daily who presents with joint pain and swelling.    Previous HPI 03/02/2024 David Berry. is a 59 y.o. male here for follow up with inflammatory arthritis on HCQ 200 mg daily and prednisone  5 mg daily who presents with joint pain and swelling.    He received a corticosteroid injection in the right shoulder during his last visit, which provided relief. However, he continues to experience morning stiffness, particularly in the right shoulder, which improves by early afternoon. The stiffness is described as 'a little tight' and is worse on one side compared to the other. He manages the stiffness by staying active and moving around to prevent it from worsening.   No swelling in the joints is reported, but there is minor joint pain, particularly around the wrist and shoulder. As the day progresses and after taking prednisone , the patient reports that the stiffness eases up.   He experiences a rash occasionally in the morning, which is managed with Zyrtec . Missing a dose of Zyrtec  results in the rash reappearing.   He mentions a decrease in appetite and is trying to adjust his diet, including overcooking meats to make them more palatable. He wants to 'pick my weight back up' and is working on finding suitable meals.     Previous HPI 01/03/2024 David Berry. is a 59 year old male with inflammatory arthritis who presents with worsening joint pain and swelling.   He has experienced increased joint pain and swelling, particularly in his shoulders and feet, over the past month.  The pain is severe enough to affect his ability to lift his arms overhead and perform daily activities such as driving. He describes stiffness after sitting for a few minutes, requiring him to 'redo it again and get this thing working.'   He is currently taking prednisone  5 mg daily, which he has been using consistently. He recalls being on a higher dose temporarily but is now on a lower dose. He also takes ibuprofen  800 mg, which he obtains from his sister as easier to swallow than 4 regular strength tablets, to manage the pain, although he is concerned about potential stomach issues.   He notes swelling in both feet, with the right ankle being particularly swollen. His symptoms were worse about a month ago but have started to ease slightly. However, he still experiences significant pain and stiffness, especially in his shoulders, which he describes as 'frozen.'   He has a history of being on atezolizumab  immunotherapy, which was paused. He has been off the immunotherapy since July and is uncertain if his current symptoms are related to the immunotherapy or a pre-existing condition. He was resumed on low dose prednisone  after follow up with Dr. Onesimo.   Unfortunately previous discussion to see Dr. Jonah with [facility] who had an interest in immunotherapy related events fell through, as she left that practice.  Previous HPI 11/18/23 David Berry. is a 59 y.o. 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   He is currently undergoing immunotherapy with Tecentriq  for lung cancer. Previously, he was on prednisone , which effectively managed his joint pain, but its use has been reduced due to potential interactions with his cancer treatment. He experiences significant discomfort, noting that he was awake from 1 AM due to pain.   He experiences muscle pain primarily in his shoulders and neck, describing it as similar to  post-exercise soreness. The pain worsens with movement, especially when raising his arms overhead, and has been problematic for the past few days. The pain is diffuse across the shoulders and neck.   He is concerned about weight loss, observing 'skinny thighs' and feeling he is losing weight, questioning if he needs more protein. No swelling or visible changes in the affected areas.   He describes his back as 'crooked' and has a history of degenerative disc disease, although he does not believe this is contributing to his current shoulder and neck pain.         Previous HPI 08/12/2023 David Berry. 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   He experiences joint pain, particularly in his fingers, with a recent episode where left middle finger became significantly swollen and somewhat deviated to the side. He also has aching in his back and hips, though currently feels well. He has a history of degenerative disc disease with mild wear and tear in the lower back, and some bone spurring. He reports no significant issues with his hip joints.   His skin condition has improved with the use of double antihistamines, specifically Zyrtec  and Pepcid , taken once daily. He notes no new breakouts and states that the treatment is effective.   Labs reviewed 06/21/23 ESR 34 CRP 11.2 RF neg UPCR wnl dsDA 264 C3/C4 wnl   Previous HPI 06/21/23 David Berry. is a 59 year old male here for evaluation of rashes, joint pain, and abnormal blood test results with positive dsDNA. He was referred by Dr. Orlan who saw him on account of urticarial rashes, which did improve on subsequent treatment with high dose zyrtec  and pepcid .   He has been on Tecentriq  (PD-L1) for lung cancer since 2020, with no changes to this treatment in the last year.   Rashes have been present since January of last year,  initially associated with starting a statin medication. The rashes appear as hives, affecting his sides, back, legs, and shoulders, and are described as itchy but not painful. They typically last overnight, leaving redness the next morning. The rashes subsided while on Zyrtec  and Pepcid , but recurred after discontinuation of these medications.   Joint pain and swelling have been present since last year, coinciding with the onset of the rashes. He describes a tingling sensation in his fingers and swelling in his hands. No prior history of similar symptoms before last year. He has not been on any steroids recently, although he used them a few years ago for back problems.   He has experienced new onset headaches over the past few months, described as short and progressive.   He has a history of falling on his right side at the end of the year before last, which he associates with current hip pain. He has not had any x-rays  for this issue. He describes the pain as arthritic, particularly when moving or getting up, and notes it is worse since stopping Zyrtec  and Pepcid .   Labs reviewed 04/2023 dsDNA 243 RNP, Sm, SSA, SSB neg ESR 43   No Rheumatology ROS completed.   PMFS History:  Patient Active Problem List   Diagnosis Date Noted   Inflammatory arthritis 11/18/2023   High risk medication use 11/18/2023   Positive ANA (antinuclear antibody) 06/21/2023   Pain in right hip 06/21/2023   Intention tremor 04/21/2021   Mononeuropathy 04/21/2021   Chronic anticoagulation 02/28/2021   Right shoulder pain 02/28/2021   Sternoclavicular joint pain, right 02/28/2021   Encounter for immunotherapy 10/29/2020   Iron  deficiency anemia 10/29/2020   Gastroesophageal reflux disease 10/02/2020   Hypotension due to hypovolemia 10/02/2020   Port-A-Cath in place 06/13/2019   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/26/2019   Former smoker 02/26/2019   Adenocarcinoma of right lung (HCC) 02/05/2019    Counseling regarding advance care planning and goals of care 02/05/2019   Acute deep vein thrombosis (DVT) of non-extremity vein    Adenocarcinoma of lung (HCC)    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   Internal jugular (IJ) vein thromboembolism, acute, right (HCC) 01/24/2019   Mass of right side of neck, s/p Radiation therapy 01/24/2019   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   Right renal mass 01/24/2019   Hip mass, right 01/24/2019   Thyromegaly 01/24/2019   Extravasation injury of IV catheter site with other complication    BACK PAIN, LUMBAR 03/24/2010   LUMBAR RADICULOPATHY 03/24/2010    Past Medical History:  Diagnosis Date   Anxiety    Bipolar disorder (HCC)    Blood in stool    Cancer (HCC)    Chronic bronchitis with emphysema (HCC)    pt denies this   Eczema    GERD (gastroesophageal reflux disease)    Lung cancer (HCC) dx'd 01/2019   Peripheral vascular disease    blood clot in neck Sept 12, 2020    Family History  Problem Relation Age of Onset   Prostate cancer Father    Colon cancer Neg Hx    Stomach cancer Neg Hx    Esophageal cancer Neg Hx    Past Surgical History:  Procedure Laterality Date   APPENDECTOMY     teenager   INGUINAL HERNIA REPAIR Right 2016, 2017   [facility], 2018 [facility]-removed mesh   IR IMAGING GUIDED PORT INSERTION  04/26/2019   TONSILLECTOMY     TOOTH EXTRACTION N/A 03/14/2020   Procedure: DENTAL RESTORATION/EXTRACTIONS;  Surgeon: Sheryle Hamilton, DDS;  Location: [facility] OR;  Service: Oral Surgery;  Laterality: N/A;   Social History   Social History Narrative   Lives alone   Caffeine- coffee, 20 oz daily, tea occas    There is no immunization history on file for this patient.   Objective: Vital Signs: There were no vitals taken for this visit.   Physical Exam   Musculoskeletal Exam: ***   Investigation: No additional findings.  Imaging: No results found.  Recent Labs: Lab Results  Component Value Date   WBC 4.5 04/25/2024   HGB 11.0  (L) 04/25/2024   PLT 356 04/25/2024   NA 140 04/25/2024   K 4.0 04/25/2024   CL 103 04/25/2024   CO2 28 04/25/2024   GLUCOSE 104 (H) 04/25/2024   BUN 8 04/25/2024   CREATININE 0.94 04/25/2024   BILITOT 0.2 04/25/2024   ALKPHOS 99 04/25/2024   AST 19  04/25/2024   ALT 16 04/25/2024   PROT 7.2 04/25/2024   ALBUMIN 4.4 04/25/2024   CALCIUM 9.8 04/25/2024   GFRAA >60 01/30/2020    Speciality Comments: PLQ Eye Exam normal 02/21/2024 [facility] f/u 12 months  Procedures:  No procedures performed Allergies: Atorvastatin calcium   Assessment / Plan:     Visit Diagnoses:  Assessment & Plan Inflammatory arthritis     Right shoulder pain, unspecified chronicity     Bilateral hand swelling     Urticaria      ***  Follow-Up Instructions: No follow-ups on file.   Jayven Naill M Sherly Brodbeck, CMA  Note - This record has been created using [employer].  Chart creation errors have been sought, but may not always  have been located. Such creation errors do not reflect on  the standard of medical care. "

## 2024-06-07 ENCOUNTER — Ambulatory Visit: Admitting: Internal Medicine

## 2024-06-07 DIAGNOSIS — M1380 Other specified arthritis, unspecified site: Secondary | ICD-10-CM

## 2024-06-07 DIAGNOSIS — M7989 Other specified soft tissue disorders: Secondary | ICD-10-CM

## 2024-06-07 DIAGNOSIS — L509 Urticaria, unspecified: Secondary | ICD-10-CM

## 2024-06-07 DIAGNOSIS — M25511 Pain in right shoulder: Secondary | ICD-10-CM

## 2024-06-13 ENCOUNTER — Other Ambulatory Visit: Payer: Self-pay

## 2024-06-27 ENCOUNTER — Inpatient Hospital Stay: Attending: Physician Assistant

## 2024-06-27 ENCOUNTER — Inpatient Hospital Stay: Admitting: Hematology

## 2024-07-23 ENCOUNTER — Ambulatory Visit: Admitting: Internal Medicine
